# Patient Record
Sex: Male | Born: 1935 | Race: Black or African American | Hispanic: No | Marital: Married | Smoking: Former smoker
Health system: Southern US, Community
[De-identification: ages and names within clinical notes are randomized; demographics above are authoritative.]

## PROBLEM LIST (undated history)

## (undated) DIAGNOSIS — R238 Other skin changes: Secondary | ICD-10-CM

## (undated) DIAGNOSIS — I219 Acute myocardial infarction, unspecified: Secondary | ICD-10-CM

## (undated) DIAGNOSIS — M7551 Bursitis of right shoulder: Secondary | ICD-10-CM

## (undated) DIAGNOSIS — R39198 Other difficulties with micturition: Secondary | ICD-10-CM

## (undated) DIAGNOSIS — I82409 Acute embolism and thrombosis of unspecified deep veins of unspecified lower extremity: Secondary | ICD-10-CM

## (undated) DIAGNOSIS — I10 Essential (primary) hypertension: Secondary | ICD-10-CM

## (undated) DIAGNOSIS — J449 Chronic obstructive pulmonary disease, unspecified: Secondary | ICD-10-CM

## (undated) DIAGNOSIS — R05 Cough: Secondary | ICD-10-CM

## (undated) DIAGNOSIS — W19XXXA Unspecified fall, initial encounter: Secondary | ICD-10-CM

## (undated) DIAGNOSIS — I2510 Atherosclerotic heart disease of native coronary artery without angina pectoris: Secondary | ICD-10-CM

## (undated) DIAGNOSIS — E7800 Pure hypercholesterolemia, unspecified: Secondary | ICD-10-CM

## (undated) DIAGNOSIS — R499 Unspecified voice and resonance disorder: Secondary | ICD-10-CM

## (undated) DIAGNOSIS — J189 Pneumonia, unspecified organism: Secondary | ICD-10-CM

## (undated) DIAGNOSIS — R233 Spontaneous ecchymoses: Secondary | ICD-10-CM

## (undated) DIAGNOSIS — I639 Cerebral infarction, unspecified: Secondary | ICD-10-CM

## (undated) DIAGNOSIS — R1310 Dysphagia, unspecified: Secondary | ICD-10-CM

## (undated) DIAGNOSIS — R059 Cough, unspecified: Secondary | ICD-10-CM

## (undated) DIAGNOSIS — I739 Peripheral vascular disease, unspecified: Secondary | ICD-10-CM

## (undated) DIAGNOSIS — Y92009 Unspecified place in unspecified non-institutional (private) residence as the place of occurrence of the external cause: Secondary | ICD-10-CM

## (undated) DIAGNOSIS — I2699 Other pulmonary embolism without acute cor pulmonale: Secondary | ICD-10-CM

## (undated) HISTORY — PX: FEMORAL-FEMORAL BYPASS GRAFT: SHX936

## (undated) HISTORY — DX: Other skin changes: R23.8

## (undated) HISTORY — DX: Cough, unspecified: R05.9

## (undated) HISTORY — DX: Unspecified fall, initial encounter: W19.XXXA

## (undated) HISTORY — DX: Dysphagia, unspecified: R13.10

## (undated) HISTORY — PX: HERNIA REPAIR: SHX51

## (undated) HISTORY — DX: Unspecified voice and resonance disorder: R49.9

## (undated) HISTORY — DX: Other difficulties with micturition: R39.198

## (undated) HISTORY — DX: Acute myocardial infarction, unspecified: I21.9

## (undated) HISTORY — PX: APPENDECTOMY: SHX54

## (undated) HISTORY — DX: Pneumonia, unspecified organism: J18.9

## (undated) HISTORY — DX: Unspecified place in unspecified non-institutional (private) residence as the place of occurrence of the external cause: Y92.009

## (undated) HISTORY — DX: Cerebral infarction, unspecified: I63.9

## (undated) HISTORY — PX: ILIAC ARTERY STENT: SHX1786

## (undated) HISTORY — DX: Chronic obstructive pulmonary disease, unspecified: J44.9

## (undated) HISTORY — DX: Cough: R05

## (undated) HISTORY — DX: Spontaneous ecchymoses: R23.3

## (undated) SURGERY — Surgical Case
Anesthesia: *Unknown

---

## 1999-09-26 ENCOUNTER — Encounter: Payer: Self-pay | Admitting: *Deleted

## 1999-09-30 ENCOUNTER — Ambulatory Visit: Admission: RE | Admit: 1999-09-30 | Discharge: 1999-09-30 | Payer: Self-pay | Admitting: *Deleted

## 1999-10-02 ENCOUNTER — Encounter: Payer: Self-pay | Admitting: *Deleted

## 1999-10-03 ENCOUNTER — Inpatient Hospital Stay (HOSPITAL_COMMUNITY): Admission: RE | Admit: 1999-10-03 | Discharge: 1999-10-07 | Payer: Self-pay | Admitting: *Deleted

## 1999-10-03 ENCOUNTER — Encounter (INDEPENDENT_AMBULATORY_CARE_PROVIDER_SITE_OTHER): Payer: Self-pay | Admitting: *Deleted

## 1999-11-20 ENCOUNTER — Encounter: Admission: RE | Admit: 1999-11-20 | Discharge: 1999-11-20 | Payer: Self-pay | Admitting: *Deleted

## 1999-11-20 ENCOUNTER — Encounter: Payer: Self-pay | Admitting: *Deleted

## 2002-10-10 ENCOUNTER — Encounter: Payer: Self-pay | Admitting: Cardiology

## 2002-10-10 ENCOUNTER — Ambulatory Visit (HOSPITAL_COMMUNITY): Admission: RE | Admit: 2002-10-10 | Discharge: 2002-10-11 | Payer: Self-pay | Admitting: Cardiology

## 2002-12-20 ENCOUNTER — Inpatient Hospital Stay (HOSPITAL_COMMUNITY): Admission: RE | Admit: 2002-12-20 | Discharge: 2002-12-22 | Payer: Self-pay | Admitting: Cardiology

## 2002-12-21 ENCOUNTER — Encounter: Payer: Self-pay | Admitting: Cardiology

## 2003-08-02 ENCOUNTER — Inpatient Hospital Stay (HOSPITAL_COMMUNITY): Admission: EM | Admit: 2003-08-02 | Discharge: 2003-08-21 | Payer: Self-pay | Admitting: Emergency Medicine

## 2003-08-02 ENCOUNTER — Encounter (INDEPENDENT_AMBULATORY_CARE_PROVIDER_SITE_OTHER): Payer: Self-pay | Admitting: *Deleted

## 2006-08-19 ENCOUNTER — Inpatient Hospital Stay (HOSPITAL_COMMUNITY): Admission: AD | Admit: 2006-08-19 | Discharge: 2006-09-03 | Payer: Self-pay | Admitting: Surgery

## 2006-08-23 ENCOUNTER — Encounter (INDEPENDENT_AMBULATORY_CARE_PROVIDER_SITE_OTHER): Payer: Self-pay | Admitting: Cardiovascular Disease

## 2007-06-16 ENCOUNTER — Encounter: Admission: RE | Admit: 2007-06-16 | Discharge: 2007-06-16 | Payer: Self-pay | Admitting: Cardiology

## 2007-06-20 ENCOUNTER — Ambulatory Visit (HOSPITAL_COMMUNITY): Admission: RE | Admit: 2007-06-20 | Discharge: 2007-06-20 | Payer: Self-pay | Admitting: Cardiology

## 2007-07-04 ENCOUNTER — Ambulatory Visit (HOSPITAL_COMMUNITY): Admission: RE | Admit: 2007-07-04 | Discharge: 2007-07-04 | Payer: Self-pay | Admitting: Specialist

## 2007-12-23 ENCOUNTER — Ambulatory Visit (HOSPITAL_COMMUNITY): Admission: RE | Admit: 2007-12-23 | Discharge: 2007-12-23 | Payer: Self-pay | Admitting: Ophthalmology

## 2009-01-28 ENCOUNTER — Inpatient Hospital Stay (HOSPITAL_COMMUNITY): Admission: EM | Admit: 2009-01-28 | Discharge: 2009-02-05 | Payer: Self-pay | Admitting: Emergency Medicine

## 2009-02-04 ENCOUNTER — Encounter (INDEPENDENT_AMBULATORY_CARE_PROVIDER_SITE_OTHER): Payer: Self-pay | Admitting: Internal Medicine

## 2010-02-03 ENCOUNTER — Encounter: Admission: RE | Admit: 2010-02-03 | Discharge: 2010-02-03 | Payer: Self-pay | Admitting: Cardiovascular Disease

## 2010-02-07 ENCOUNTER — Inpatient Hospital Stay (HOSPITAL_COMMUNITY): Admission: RE | Admit: 2010-02-07 | Discharge: 2010-02-08 | Payer: Self-pay | Admitting: Cardiovascular Disease

## 2010-07-16 LAB — CBC
MCH: 27.1 pg (ref 26.0–34.0)
MCHC: 32.5 g/dL (ref 30.0–36.0)
Platelets: 193 10*3/uL (ref 150–400)
RBC: 4.46 MIL/uL (ref 4.22–5.81)
RDW: 15.4 % (ref 11.5–15.5)

## 2010-07-16 LAB — BASIC METABOLIC PANEL
Calcium: 8.5 mg/dL (ref 8.4–10.5)
Creatinine, Ser: 1.22 mg/dL (ref 0.4–1.5)
GFR calc non Af Amer: 58 mL/min — ABNORMAL LOW (ref >60)
Glucose, Bld: 92 mg/dL (ref 70–99)
Sodium: 135 mEq/L (ref 135–145)

## 2010-08-07 LAB — RETICULOCYTES
RBC.: 3.97 MIL/uL — ABNORMAL LOW (ref 4.22–5.81)
Retic Ct Pct: 0.7 % (ref 0.4–3.1)

## 2010-08-07 LAB — CBC
HCT: 27 % — ABNORMAL LOW (ref 39.0–52.0)
HCT: 29.9 % — ABNORMAL LOW (ref 39.0–52.0)
Hemoglobin: 9.9 g/dL — ABNORMAL LOW (ref 13.0–17.0)
MCHC: 33.3 g/dL (ref 30.0–36.0)
MCV: 80.2 fL (ref 78.0–100.0)
MCV: 81.8 fL (ref 78.0–100.0)
Platelets: 200 10*3/uL (ref 150–400)
Platelets: 214 10*3/uL (ref 150–400)
Platelets: 257 10*3/uL (ref 150–400)
RBC: 3.57 MIL/uL — ABNORMAL LOW (ref 4.22–5.81)
RBC: 3.59 MIL/uL — ABNORMAL LOW (ref 4.22–5.81)
RBC: 3.73 MIL/uL — ABNORMAL LOW (ref 4.22–5.81)
WBC: 6.1 10*3/uL (ref 4.0–10.5)
WBC: 7.1 10*3/uL (ref 4.0–10.5)
WBC: 7.6 10*3/uL (ref 4.0–10.5)
WBC: 9.6 10*3/uL (ref 4.0–10.5)

## 2010-08-07 LAB — COMPREHENSIVE METABOLIC PANEL
Alkaline Phosphatase: 132 U/L — ABNORMAL HIGH (ref 39–117)
BUN: 5 mg/dL — ABNORMAL LOW (ref 6–23)
Glucose, Bld: 144 mg/dL — ABNORMAL HIGH (ref 70–99)
Potassium: 3.9 mEq/L (ref 3.5–5.1)
Sodium: 142 mEq/L (ref 135–145)
Total Bilirubin: 1.3 mg/dL — ABNORMAL HIGH (ref 0.3–1.2)
Total Protein: 5.6 g/dL — ABNORMAL LOW (ref 6.0–8.3)

## 2010-08-07 LAB — PHOSPHORUS: Phosphorus: 1.7 mg/dL — ABNORMAL LOW (ref 2.3–4.6)

## 2010-08-07 LAB — BASIC METABOLIC PANEL
BUN: 20 mg/dL (ref 6–23)
CO2: 21 mEq/L (ref 19–32)
Calcium: 7.8 mg/dL — ABNORMAL LOW (ref 8.4–10.5)
Chloride: 110 mEq/L (ref 96–112)
Creatinine, Ser: 1.5 mg/dL (ref 0.4–1.5)
GFR calc Af Amer: 52 mL/min — ABNORMAL LOW (ref >60)
GFR calc non Af Amer: 41 mL/min — ABNORMAL LOW (ref >60)
GFR calc non Af Amer: 46 mL/min — ABNORMAL LOW (ref >60)
Potassium: 2.8 mEq/L — ABNORMAL LOW (ref 3.5–5.1)
Potassium: 3.2 mEq/L — ABNORMAL LOW (ref 3.5–5.1)
Sodium: 140 mEq/L (ref 135–145)

## 2010-08-07 LAB — AMYLASE: Amylase: 43 U/L (ref 27–131)

## 2010-08-07 LAB — FOLATE

## 2010-08-07 LAB — LIPASE, BLOOD: Lipase: 22 U/L (ref 11–59)

## 2010-08-07 LAB — MAGNESIUM: Magnesium: 1.8 mg/dL (ref 1.5–2.5)

## 2010-08-07 LAB — VITAMIN B12: Vitamin B-12: 481 pg/mL (ref 211–911)

## 2010-08-07 LAB — BRAIN NATRIURETIC PEPTIDE
Pro B Natriuretic peptide (BNP): 255 pg/mL — ABNORMAL HIGH (ref 0.0–100.0)
Pro B Natriuretic peptide (BNP): 96 pg/mL (ref 0.0–100.0)

## 2010-08-08 LAB — URINALYSIS, ROUTINE W REFLEX MICROSCOPIC
Bilirubin Urine: NEGATIVE
Hgb urine dipstick: NEGATIVE
Nitrite: NEGATIVE
Protein, ur: NEGATIVE mg/dL
Specific Gravity, Urine: 1.011 (ref 1.005–1.030)
Urobilinogen, UA: 0.2 mg/dL (ref 0.0–1.0)

## 2010-08-08 LAB — COMPREHENSIVE METABOLIC PANEL
ALT: 10 U/L (ref 0–53)
AST: 14 U/L (ref 0–37)
Alkaline Phosphatase: 77 U/L (ref 39–117)
BUN: 14 mg/dL (ref 6–23)
CO2: 24 mEq/L (ref 19–32)
Calcium: 8.2 mg/dL — ABNORMAL LOW (ref 8.4–10.5)
Calcium: 9.4 mg/dL (ref 8.4–10.5)
Creatinine, Ser: 1.52 mg/dL — ABNORMAL HIGH (ref 0.4–1.5)
GFR calc Af Amer: 55 mL/min — ABNORMAL LOW (ref >60)
GFR calc non Af Amer: 45 mL/min — ABNORMAL LOW (ref >60)
GFR calc non Af Amer: 46 mL/min — ABNORMAL LOW (ref >60)
Glucose, Bld: 144 mg/dL — ABNORMAL HIGH (ref 70–99)
Glucose, Bld: 145 mg/dL — ABNORMAL HIGH (ref 70–99)
Sodium: 139 mEq/L (ref 135–145)
Total Protein: 6.2 g/dL (ref 6.0–8.3)
Total Protein: 8.2 g/dL (ref 6.0–8.3)

## 2010-08-08 LAB — CBC
HCT: 43.5 % (ref 39.0–52.0)
Hemoglobin: 14.3 g/dL (ref 13.0–17.0)
MCHC: 32.9 g/dL (ref 30.0–36.0)
MCHC: 32.9 g/dL (ref 30.0–36.0)
MCV: 82.1 fL (ref 78.0–100.0)
RBC: 4.6 MIL/uL (ref 4.22–5.81)
RDW: 14.7 % (ref 11.5–15.5)
RDW: 14.8 % (ref 11.5–15.5)

## 2010-08-08 LAB — URINE CULTURE
Colony Count: NO GROWTH
Culture: NO GROWTH

## 2010-08-08 LAB — CARDIAC PANEL(CRET KIN+CKTOT+MB+TROPI)
CK, MB: 2.4 ng/mL (ref 0.3–4.0)
CK, MB: 2.6 ng/mL (ref 0.3–4.0)
Relative Index: 1.9 (ref 0.0–2.5)
Total CK: 128 U/L (ref 7–232)
Total CK: 135 U/L (ref 7–232)
Troponin I: 0.02 ng/mL (ref 0.00–0.06)

## 2010-08-08 LAB — PROTIME-INR
INR: 1.1 (ref 0.00–1.49)
Prothrombin Time: 13.9 seconds (ref 11.6–15.2)

## 2010-08-08 LAB — BASIC METABOLIC PANEL
Calcium: 8.3 mg/dL — ABNORMAL LOW (ref 8.4–10.5)
Creatinine, Ser: 1.23 mg/dL (ref 0.4–1.5)
GFR calc Af Amer: >60 mL/min (ref >60)
GFR calc non Af Amer: 58 mL/min — ABNORMAL LOW (ref >60)
Sodium: 137 mEq/L (ref 135–145)

## 2010-08-08 LAB — LACTIC ACID, PLASMA: Lactic Acid, Venous: 1.1 mmol/L (ref 0.5–2.2)

## 2010-08-08 LAB — DIFFERENTIAL
Basophils Relative: 0 % (ref 0–1)
Lymphs Abs: 0.9 10*3/uL (ref 0.7–4.0)
Monocytes Relative: 6 % (ref 3–12)
Neutro Abs: 7 10*3/uL (ref 1.7–7.7)
Neutrophils Relative %: 82 % — ABNORMAL HIGH (ref 43–77)

## 2010-08-08 LAB — POCT CARDIAC MARKERS: Myoglobin, poc: 176 ng/mL (ref 12–200)

## 2010-08-08 LAB — LIPASE, BLOOD: Lipase: 50 U/L (ref 11–59)

## 2010-09-16 NOTE — Procedures (Signed)
NAMEANTINIO, SANDERFER NO.:  [id number]   MEDICAL RECORD NO.:  [id number]          PATIENT TYPE:  INP   LOCATION:  2502                         FACILITY:  [facility]   PHYSICIAN:  Nanetta Batty, M.D.   DATE OF BIRTH:  16-Aug-1933   DATE OF PROCEDURE:  DATE OF DISCHARGE:                    PERIPHERAL VASCULAR INVASIVE PROCEDURE    [name] is a 77 year old thin-appearing married African American  male, father of three, grandfather of four grandchildren whose wife  [name] is also a patient of mine.  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  His  risk factors include remote tobacco abuse, hypertension, hyperlipidemia,  and family history.  His father died of an MI in his 95s and a sister  had an MI in her 44s.  He has never had a heart attack or stroke.  He  had a nonischemic Myoview on Sep 16, 2007, and again on February 04, 2010.  He had a fem-fem crossover graft performed by Dr. Liliane Bade because of  occluded left common iliac artery with known occluded SFAs bilaterally.  He also had stenting of his right common iliac artery on July 04, 2007.  He does complain of calf claudication, which has been increasing in  severity.  Dopplers show high-frequency signal in his right external  iliac artery with progressively decreasing ABIs.  He presents now for  angiography and potential intervention for lifestyle-limiting  claudication.   PROCEDURE DESCRIPTION:  The patient was brought to the Second Floor  [facility] in the postabsorptive state.  He was  premedicated with p.o. Valium, IV Versed, and fentanyl.  His right groin  was prepped and shaved in usual sterile fashion.  Xylocaine 1% was used  for local anesthesia.  A short 5-French sheath was inserted into the  right femoral artery using standard Seldinger technique below the  insertion of the fem-fem crossover graft.  This was  visualized  angiographically through the Seldinger needle.  A 5-French tennis racket  catheter was used for abdominal aortography as well as pelvic  angiography.  Visipaque dye was used for the entirety of the case.  Retrograde aortic pressures were monitored during the case.  A pullback  gradient was performed using a 5-French end-hole and 200 mcg of intra-  arterial nitroglycerin across the right external iliac artery stenosis,  which showed a 20-30 mm pullback gradient.  Visipaque dye was used for  the entirety of the case.  Retrograde aortic pressures were monitored  during the case.   ANGIOGRAPHIC RESULTS:  1. Abdominal aorta.      a.     Renal arteries - normal.      b.     Infrarenal abdominal aorta - normal.  2. Left lower extremity.      a.     Occluded left common iliac artery.  3. Right lower extremity.      a.     Patent left ostomy, right common iliac artery stent.      b.     A 75% right external  iliac artery stenosis with a 20-30 mm       pullback gradient after administration of intra-arterial       nitroglycerin.      c.     Patent fem-fem crossover graft.   PROCEDURE DESCRIPTION:  The 5-French sheath was exchanged over Versacore  wire for a 6-French bright tip.  The patient received 4000 units of  heparin intravenously.  He was on aspirin and Plavix.  The stenosis was  predilated with a 5 x 2 balloon, stented with a 10 x 4 Protege and  postdilated with a 7 x 3 balloon and 3 atmospheres resulting in  reduction of 75% stenosis to 0% residual.  The patient tolerated the  procedure well.  ACT was measured  and the sheath was removed.  Pressure was used on the groin to achieve  hemostasis.  The patient left the lab in stable condition.  He will be  gently hydrated, discharged home in the morning.  He will get followup  Dopplers in our office, and I will see him back after that in followup.      Nanetta Batty, M.D.      JB/MEDQ  D:  02/07/2010  T:  02/08/2010   Job:  [id number]   cc:   [facility]  Deirdre Peer. Polite, M.D.  [facility]   Electronically Signed by Nanetta Batty M.D. on 02/12/2010 01:04:12 PM

## 2010-09-16 NOTE — Cardiovascular Report (Signed)
[name], [name]                ACCOUNT NO.:  [id number]   MEDICAL RECORD NO.:  [id number]          PATIENT TYPE:  AMB   LOCATION:  SDS                          FACILITY:  [facility]   PHYSICIAN:  Vonna Kotyk R. Jacinto Halim, MD       DATE OF BIRTH:  1933-11-13   DATE OF PROCEDURE:  07/04/2007  DATE OF DISCHARGE:                            CARDIAC CATHETERIZATION   PROCEDURE PERFORMED:  1. Abdominal aortogram.  2. Right iliac arteriogram.  3. PTA and stenting of the right common and external iliac artery.  4. Balloon angioplasty of the stent.   INDICATIONS:  [name] is a 73-year gentleman with known  peripheral arterial disease.  He has known occluded left common iliac  artery and has a right femoral to left femoral arterial bypass graft.  His SFA is systolically known to be occluded, and the profunda femoral  artery gives collaterals to the distal SFA on the left, with maintenance  of three-vessel runoff.  On the right, he had mild diffuse disease, and  his right superficial femoral arteries are occluded.  He has been  complaining of claudication, and he had undergone outpatient Doppler  evaluation, and this had revealed high-grade stenosis of the right  common iliac artery.  His ABI on the right was 0.58, and the left was  0.48, and this had decreased from prior ABIs.   Given this, there was jeopardy of both his lower extremities with a high-  grade right common iliac artery stenosis and a known right femoral to  left femoral artery bypass grafting.  He is now brought to the  catheterization lab to evaluate his peripheral anatomy.   ABDOMINAL AORTOGRAM:  Distal abdominal aortogram revealed the left iliac  artery to be occluded and the right iliac artery to be widely patent  with the calcific 60% to 70% stenosis.  The right femoral to left  femoral bypass grafting widely patent.   The left superficial femoral artery is occluded at it's origin, and the  left profunda has a ostial 60%  to 70% stenosis.  This is unchanged from  prior catheterization in August of 2004.   There was a 28 to 25-mm pressure gradient across the right common iliac  artery stenosis.   INTERVENTION DATA:  Successful PTA and direct stenting of the right  common iliac artery with implantation of a 10.0 x 60-mm Prodigy self-  expanding stent which was post dilated to 9.0 x 40-mm Agiltrac balloon  at peak of 10 atmospheric pressure.  The stenosis was reduced from 70%  to zero percent, with brisk flow.   RECOMMENDATIONS:  The patient will be discharged home today with  outpatient Dopplers of the right iliac arteries and ABI of both the  lower extremities.  If the symptoms of claudication persist, and there  is no change in the ABI, then only will I would consider antegrade  access to the right common femoral artery and balloon angioplasty of the  left profunda femoral artery, via right femoral to left femoral graft.  This will be a last resort.   The  patient will be continued on the present aggressive medical therapy.   A total of 80 mL of contrast was utilized for diagnostic and  interventional procedure.   Under usual sterile precaution, a abdominal aortogram was performed,  then a right iliac arteriogram was also performed.  A careful pullback  was performed using a end-hole catheter.  Abdominal gram was performed  using a pigtail catheter with a Wholey wire.   TECHNIQUE OF INTERVENTION:  Using 3000 units of heparin, exchange of 5-  French sheath to a 7-French short sheath, then a 10.0 x 60-mm Prodigy  self-expanding stent was advanced and positioned appropriately into the  origin of the common iliac artery, and the stent was deployed and  postdilated with a 9.0 by 40-mm atrial tract balloon at 10 atmospheres  of pressure.  The stenosis was reduced from 70% to zero percent.  Excellent brisk flow was noted without any residual pressure gradient  across the stenosis.  The catheter was then  pulled out of the body.  The  patient tolerated the procedure.  No immediate complications were noted.   Please note:  The common femoral arteriogram was not performed to  preserve and reduce contrast use, as the patient does have chronic renal  insufficiency.      Cristy Hilts. Jacinto Halim, MD  Electronically Signed     JRG/MEDQ  D:  07/04/2007  T:  07/04/2007  Job:  [id number]

## 2010-09-19 NOTE — Op Note (Signed)
[name], [name]                            ACCOUNT NO.:  [id number]   MEDICAL RECORD NO.:  [id number]                   PATIENT TYPE:  INP   LOCATION:  0157                                 FACILITY:  [facility]   PHYSICIAN:  Currie Paris, M.D.           DATE OF BIRTH:  Aug 30, 1933   DATE OF PROCEDURE:  08/02/2003  DATE OF DISCHARGE:                                 OPERATIVE REPORT   PREOPERATIVE DIAGNOSIS:  Acute small bowel obstruction.   POSTOPERATIVE DIAGNOSIS:  Perforated appendicitis with small bowel  obstruction.   OPERATION:  Exploratory laparotomy with cecectomy.   SURGEON:  Dr. Jamey Ripa.   ASSISTANT:  Dr. Earlene Plater.   ANESTHESIA:  General endotracheal.   CLINICAL HISTORY:  This patient is a 77 year old, who has presented with a 3  day history of diffuse abdominal pain, nausea, vomiting, distention, and  lack of bowel movements.  His white count was normal.  He was afebrile.  His  x-rays appeared to show a small bowel obstruction.  The patient was markedly  volume depleted and had been given IV fluids.  He was started on  antibiotics.  He was taken to the operating room for exploratory laparotomy.   DESCRIPTION OF PROCEDURE:  The patient was taken to the operating room and  after satisfactory general endotracheal anesthesia had been obtained, Foley  catheter was placed.  The abdomen was prepped and draped.  A midline  incision was made and the abdominal cavity entered.  Markedly dilated small  bowel was found, and I started to manipulate this out of the abdomen.  As we  got towards the terminal ileum, there was an inflammatory process.  As I  manipulated through that, I entered an abscess cavity and found a necrotic  appendix stuck into the pelvis with a loop of distal small bowel that had  been stuck down to that, acting as a point of obstruction.  I was able to  bluntly dissect this up out of the lateral attachments and free up the cecum  and mobilize this up into  the wound.  The small bowel mesentery had a small  tear in it which had to be sutured, but this was very friable I think  primarily from the inflammatory process.   Once we had the cecum up, I could see that the appendix was necrotic all the  way to the base and actually a little bit of the cecum appeared necrotic as  well.  However, this was a fairly localized area descending about 1.5 cm  around the base of the appendix.  After having this well-mobilized up, I was  able to grasp the cecum on either side of the appendix with a Babcock to  hold up the worrisome area and put a TA 60 stapler under this and fire it  and amputate this portion of the cecum along with the appendix.  Having done  this at  the base of the appendix, the cecum then appeared quite viable, and  I think we were down to good healthy tissue here.   At this point, I stopped and spent a few minutes trying to milk the  intestinal contents back into the stomach so that we could decompress these  which we did.  I then spent several minutes irrigating, using about 4-5  liters of warm saline to completely irrigate the abdominal cavity.  This  chronic abscess cavity looked to have been present for I thought more than  just a few days and likely had been smoldering without his really being too  particularly symptomatic since it seemed to be primarily retrocecal and  pelvic rather than more anterior.  Nevertheless, once we had this irrigation  done, everything appeared intact.  I went ahead and closed with running #1  PDS followed by staples and using some Telfa wicks in between the staples.   The patient tolerated the procedure well.  There were no operative  complications.   All counts were correct.                                               Currie Paris, M.D.    CJS/MEDQ  D:  08/02/2003  T:  08/02/2003  Job:  [id number]   cc:   Derrek Gu, MD  [facility]   [place] R. Jacinto Halim, M.D.  [address]   [place]  [place] [address]  Fax: [phone]

## 2010-09-19 NOTE — Cardiovascular Report (Signed)
[name]                            ACCOUNT NO.:  [id number]   MEDICAL RECORD NO.:  [id number]                   PATIENT TYPE:  OIB   LOCATION:  2875                                 FACILITY:  [facility]   PHYSICIAN:  Cristy Hilts. Jacinto Halim, M.D.                  DATE OF BIRTH:  [birth year]   DATE OF PROCEDURE:  10/10/2002  DATE OF DISCHARGE:                              CARDIAC CATHETERIZATION   PROCEDURES PERFORMED:  1. Abdominal aortogram.  2. Iliac arteriogram with bifemoral runoff.   CARDIOLOGIST:  Cristy Hilts. Jacinto Halim, M.D.   INDICATIONS:  [name] is a 77 year old African-American male with  history of hypertension, hyperlipidemia, severe peripheral vascular disease  status post right femoral to left femoral bypass grafting performed October 03, 1999 by Dr. Court Joy by means of a 7 mm Hemashield dacron graft presents  for evaluation of severe symptomatic claudication.  The patient, in fact,  has been unable to walk even within his house without having to stop for  symptomatic claudication.  Given his he underwent Dopplers evaluation, which  revealed severely abnormal Dopplers studies.  He was brought to the  catheterization lab to evaluate his peripheral anatomy.   ABDOMINAL AORTOGRAM:  Abdominal aortogram revealed mild arteriosclerotic  changes of the abdominal aorta.  There was no evidence of abdominal aortic  aneurysm.  There were two renal arteries, one on either side.  They are  widely patent.  The aortoiliac bifurcation was widely patent on the left and  the right had about 40% stenosis.   RIGHT ILIAC ARTERIOGRAM WITH FEMORAL RUNOFF:  The right iliac arteriogram  with femoral runoff revealed the right iliac artery to have mild-to-moderate  diffuse disease.  There is a graft arising from the right femoral artery  extending over to the left femoral artery.  The right common femoral artery  continues as SFA, which has a mid 90% stenosis followed by an occlusion in  its  mid-to-distal segment.  Distally the SFA continues as popliteal artery  and has diffuse disease.  There is only a peroneal runoff that is well-noted  with severe disease of anterior and posterior tibials.   LEFT ILIAC ARTERIOGRAM WITH FEMORAL RUNOFF:  The left iliac artery is  occluded in its proximal segment.  The left common femoral artery is  supplied by the right-to-left femorofemoral grafting.  Immediately after the  graft insertion into the right common femoral artery the common femoral  artery continues as SFA and has an aneurysmal dilatation followed by a  complete occlusion.  There is a long segment occlusion, which is  collateralized by the profunda femoris on the left side.  Again, the  popliteal artery has mild-to-moderate diffuse disease and the anterior and  posterior tibials are occludes with only peroneal runoff in the left lower  extremity.   IMPRESSION:  1. Mild disease of the abdominal aorta and  widely patent renal arteries.  2. Occluded left iliac artery that was evident on prior angiogram in 2001.     There is severe progression of disease in the left lower extremity with     occlusion of the left superficial femoral artery and severe disease in     the legs with occluded anterior and posterior tibials with one-vessel     runoff.  The left superficial femoral artery is occluded after the     femorofemoral bypass graft.  3. The right common iliac has 40% ostial stenosis.  There is a 15 mm     pressure gradient apex stenosis, otherwise mild, diffuse.  The right     superficial femoral artery has 90% stenosis in the mid segment and is     occluded in its mid-to-distal segment, very short occlusion.  Again,     severe disease of the anterior and posterior tibials, which are severely     disease and there is only one vessel runoff by means of the peroneal     artery the  right lower extremity.   RECOMMENDATIONS:  The patient is at a very high risk for limb loss.  I  would  like to give an attempt by means of an antegrade approach to cross through  the femorofemoral graft and try to open up the left superficial femoral  artery, which is occluded.  I would to do this on an elective basis.  I will  discuss these issues with the patient and the family; and, the family is in  agreement at this time.  I will discuss with a colleague before proceeding  with the procedure.   TECHNIQUE OF THE PROCEDURE:  Under the usual sterile precautions using 5  French right femoral arterial access a 5 French pigtail catheter was  advanced into the abdominal aorta over a 0.035 inch J wire.  Abdominal  aortogram was performed.  Then the pigtail catheter was pulled back into the  aortoiliac bifurcation and iliac arteriogram with femoral runoff was  performed.  Then the catheter was pulled out of the body and an end-hole was  advanced into the abdominal aorta and slowly pulled back to monitor the  pressure gradient across the right aortoiliac bifurcation stenosis.  There  is a 15 mm pressure gradient and the catheter was pulled out of the body.   Right femoral angiography and the femorofemoral bypass graft angiogram was  performed through the right femoral arterial sheath.  After obtaining  adequate views the access sheath was pulled, manual pressure was held,  adequate hemostasis was obtained, and patient was transferred to recovery in  stable condition.                                                 Cristy Hilts. Jacinto Halim, M.D.    Pilar Plate  D:  10/10/2002  T:  10/10/2002  Job:  [id number]   cc:   Gabriel Earing, M.D.  [facility]   [facility]

## 2010-09-19 NOTE — H&P (Signed)
[name], [name]                            ACCOUNT NO.:  [id number]   MEDICAL RECORD NO.:  [id number]                   PATIENT TYPE:  INP   LOCATION:  0101                                 FACILITY:  [facility]   PHYSICIAN:  Currie Paris, M.D.           DATE OF BIRTH:  1933-07-26   DATE OF ADMISSION:  08/02/2003  DATE OF DISCHARGE:                                HISTORY & PHYSICAL   CHIEF COMPLAINT:  Abdominal pain.   HISTORY OF PRESENT ILLNESS:  [name] is a 77 year old gentleman who  presented to Dr. Kathrynn Running at [facility] today with increasing abdominal pain.  His pain began Monday (today is Thursday) with nausea, vomiting, and  abdominal distention. His pain was poorly localized, but more in the lower  abdomen than upper.  He developed nausea, vomiting, but no fever. He thought  he was constipated and tried taking a laxative that did not help, and he had  no real result.  Because of continuing pain he tried an enema this morning  at about 3:30 and again no improvement. He has not been able to eat anything  or keep anything down, although he has been continuing to try to take his  medications. His pain, at the time I saw him, was fairly diffuse and not  acute. It was not particularly localized at all. He had not had a bowel  movement he said for a day, except for miniscule results with his enema this  morning.   OPERATIONS:  He has had bilateral fem-pops with apparently a femoral hernia  being repaired at the time of one of these. He had prior attempts at  placement of vascular stents which have been unsuccessful.   MEDICATIONS:  He takes Clarinex, Nexium, Advicor, Plendil, Plavix, Toprol,  and Cozaar.   ALLERGIES:  None known.   HABITS:  Smokes: None. Alcohol: None.   SOCIAL HISTORY:  The patient is retired.   FAMILY HISTORY:  Unremarkable.   REVIEW OF SYSTEMS:  HEENT: Basically negative. CHEST: No cough or shortness  of breath. HEART: History of hypertension.  No history of MIs. Does have a  history of having had a normal Cardiolite about a year ago.  ABDOMEN:  Negative except for HPI. GU: Negative. EXTREMITIES: He has had a history of  peripheral vascular disease.   PHYSICAL EXAMINATION:  GENERAL: The patient is a thin, somewhat toxic, but  not acutely, and ill-appearing male.  VITAL SIGNS: His blood pressure at [facility] is 110/80, about 90 systolic  when he came here with a pulse of 120, but after fluids came up to about 130  and still with a pulse of around 120. Temperature has been in the 97 to 98  range over several readings. His weight was noted to be 128 at [facility].  HEENT:  Head is normocephalic. Eyes reveal pupils equal, round, and regular;  nonicteric. Pharynx reveals he does  have dry mucous membranes; otherwise  unremarkable.  NECK: Supple with no masses or thyromegaly.  LUNGS: Normal respiration; clear to auscultation.  HEART: Rapid rate. No murmurs, rubs, or gallops heard.  ABDOMEN: Diffusely distended and tender. There is some mild rebound present.  He seems perhaps more tender in the right lower quadrant than any place  else.  Bowel sounds when I listened were quiet.  RECTAL EXAM: Negative at [facility] and not repeated by me.  EXTREMITIES: There is no cyanosis or edema. I think I can feel some pulses  in his feet.   LABORATORY STUDIES:  White count 4800. Electrolytes normal, but creatinine  3.9 with BUN 48.   Abdominal films are consistent with a small bowel obstruction, but there is  question of a dilated ascending colon. Chest x-ray is unremarkable.  EKG  shows sinus tachycardia with some ST changes, question of digoxin effect,  but nothing to suggest acute MI.   IMPRESSION:  1. Acute small bowel obstruction of uncertain etiology.  2. Volume depletion secondary to #1.  3. Acute renal insufficiency secondary to #1.  4. Hypertension.  5. History of peripheral vascular disease.   PLAN:  He has already been  receiving IV fluids. Will start him on IV  antibiotics and place an NG. Once we have some hydration established I think  we need to go ahead with exploratory laparotomy as I am concerned about his  tenderness and x-ray pictures that he may have some significant process  going on that needs urgent surgery.                                               Currie Paris, M.D.    CJS/MEDQ  D:  08/02/2003  T:  08/02/2003  Job:  [id number]   cc:   [facility]  Attn: Dr. Kathrynn Running

## 2010-09-19 NOTE — Discharge Summary (Signed)
[name], [name]                            ACCOUNT NO.:  [id number]   MEDICAL RECORD NO.:  [id number]                   PATIENT TYPE:  INP   LOCATION:  0355                                 FACILITY:  [facility]   PHYSICIAN:  Currie Paris, M.D.           DATE OF BIRTH:  01/31/1934   DATE OF ADMISSION:  08/02/2003  DATE OF DISCHARGE:  08/21/2003                                 DISCHARGE SUMMARY   FINAL DIAGNOSES:  1. Acute small bowel obstruction secondary to perforated appendicitis and     appendiceal abscess.  2. History of peripheral vascular disease.  3. Persistent postoperative tachycardia secondary most likely to beta     blocker withdrawal and low-grade sepsis.  4. History of hypertension.  5. Acute renal insufficiency secondary to volume depletion, creatinine 3.9     on admission, but this corrected down to 1.2 by the time of discharge     with rehydration.   CLINICAL HISTORY:  [name] presented to the emergency room with what  appeared to be acute small bowel obstruction developing over several days.  He has a history of peripheral vascular disease, hypertension, and has been  on chronic beta blockers.   HOSPITAL COURSE:  The patient was admitted and taken to the operating room  where a exploratory laparotomy was done.  He was found to have a perforated  appendicitis with appendiceal abscess causing the small bowel obstruction.  Partial cecectomy was done along with the appendectomy.  He tolerated the  procedure well.   Postoperatively, he had some hypotension and tachycardia probably related to  volume issues but also to withdrawal of his beta blockers.  His hemoglobin  did drift down, and he had been on Plavix.  We were concerned that he had  bled postoperatively and gave him 2 units of blood, but most likely this was  secondary to marked volume depletion initially and rehydration, but there  was some evidence on CT of some hemoperitoneum as well.  However,  this  remained stable and did not prove to be any problem.   He was seen by cardiology consultation and remained stable on NG suction and  took several days of IV Lopressor, __________, and Catapres patch to control  blood pressure and pulse, but these eventually stabilized out.  By April 5,  which was his fifth postop day, he was passing a little bit of gas with  small bowel movement.  His abdomen was improving.  His wound appeared to be  clean.  White count was down to 8000.  His TSH was slightly low.  T4 was  pending.  He was continued on IV ciprofloxacin for antibiotics.  He remained  NPO because he was unable to really tolerate much orally and by the seventh  postop day, he was started on some clear liquids, trying to gradually  advance his diet.  By the ninth postop day, he developed  some vomiting and  PICC line was placed and TNA began for malnutrition.  By the 11th postop  day, he started dramatically improving with a lot of gas passage.  He had  benign abdomen.  His wound looked okay.  He had a little drainage from it  but never developed a wound infection.  He had his NG discontinued and diet  increased over the next several days, went very slowly.  We switched his  antibiotics from ciprofloxacin to Zosyn and on April 13, discontinued his  Zosyn.  By April 15, he was feeling better, wanted to eat more.  We began to  wean his TNA.  His fevers eventually went away and by April 19, he was both  stable cardiovascular wise and surgical wise and able to be discharged.  Discharge medicines were managed by cardiology for his blood pressure  medicines.  He was given Advil and Motrin for pain to follow up in my office  in a couple of weeks, to follow up with Dr. Jacinto Halim on the 18th.   DISCHARGE MEDICATIONS:  1. Metoprolol 50 mg b.i.d.  2. Cozaar 100 mg daily.  3. Protonix 40 mg daily.  4. More office medications to be started in the office.   Laboratory studies this admission showed a  hemoglobin of 7.5  postoperatively.  White counts remained normal.  After transfusion, his  hemoglobin count was around 10 and stabilized at that level.  CKs were  checked and were unremarkable.                                               Currie Paris, M.D.    CJS/MEDQ  D:  09/13/2003  T:  09/13/2003  Job:  [id number]   cc:   Dr. Kathrynn Running  [facility] [place]   Dr. Jacinto Halim  [facility]

## 2010-09-19 NOTE — H&P (Signed)
[place]. [facility]  Patient:    [name], [name]                         MRN: [id number] Adm. Date:  10/03/99 Attending:  Denman George, M.D. Dictator:   Sherrie George, P.A. CC:         Meliton Rattan, M.D., [facility] [place], [place]             [facility]                         History and Physical  BRIEF HISTORY:  The patient is a 77 year old black male with a long history of claudication of the left leg dating back at least one year.  The pain was initially in his calf but not has progressed to include his foot.  He can walk flat for 200 or 300 yards before it hurts him, but any hill makes the pain worse immediately.  He was evaluated by Dr. Earlene Plater at [facility] and found to have diminished pulses and was referred to CVTS and Dr. Liliane Bade.  Doppler studies in our office show an ankle-brachial index of 0.49 on the left and greater than 1.0 on the right.  He has been experiencing increasing numbness and pain in his left lower extremity.  He has undergone outpatient arteriogram at [facility] and is admitted at this time for a right-to-left femoral-femoral bypass graft to relieve his left lower extremity claudication.  ALLERGIES:  None known.  MEDICATIONS ON ADMISSION: 1. Pletal 100 mg b.i.d. 2. Hydrocodone p.r.n. which he has not used.  PAST MEDICAL HISTORY:  Includes a fractured right ankle six to eight years ago.  He has had laser surgery to both eyes to correct his vision and a history of asthma 40 years ago.  He also had pneumonia approximately in November of last year which was treated successfully with antibiotics.  SOCIAL HISTORY:  He is a retired Counsellor for [employer].  He has been married for 38 years.  They have two children, ages 13 and 40, both in good health.  He eats a regular diet.  He smoked one pack of cigarettes per day for 37 years.  He stopped two weeks ago.  He used to drink alcohol,  approximately two 6-packs per week, and he stopped that five years ago.  FAMILY HISTORY:  His father is deceased at age 22 with an MI, but he was at work when he did it.  Mother died at age 49 of old age, and he is not sure what the cause of death was.  He has two brothers living.  He has one brother with diabetes and one sister who has had an MI.  His one brother with diabetes is the only family member that has it.  A sister and father both have heart disease.  No other significant family history.  REVIEW OF SYSTEMS:  He wears reading glasses and only uses them for very fine print since his laser surgery.  Cardiovascular is negative.  Pulmonary: He did note shortness of breath after being at [facility] that resolved within 24 hours after discharge.  Cardiac: Negative.  GI: Negative.  GU: Negative.  PHYSICAL EXAMINATION:  VITAL SIGNS:  Heart rate 120, respirations 18 and unlabored, blood pressure 128/100 in both arms in the sitting position.  He had a similar problem at [facility],  and he says his blood pressure went down later.  GENERAL:  He is well-nourished, well-developed, thin, black male in no acute distress.  HEENT:  Head: Normocephalic.  Eyes: PERRLA. EOMs intact.  Fundi not visualized.  He had mild bilateral arcus senilis.  Ears, nose, throat, and mouth were essentially normal.  He does have false teeth.  NECK:  No bruits.  CHEST:  Clear to auscultation and percussion bilaterally.  HEART:  Normal S1 and S2.  No murmurs, rubs, or gallops.  He was tachycardic. Pulses in the carotid, radial, brachial are +2 and equal.  The femoral was +2 on the right, +1 on the left.  Nothing was palpated below the femoral on the left.  The popliteal was +1, PT +1 on the right, DP +2 on the right.  ABDOMEN:  Soft, nontender.  Positive bowel sounds.  No palpable organomegaly.  GU/RECTAL:  Not performed, not medically indicated.  EXTREMITIES:  No clubbing, cyanosis, or edema.   Left lower extremity is without any ulcerations or wounds.  Motion and sensation are intact.  NEUROLOGIC:  No focal deficits.  IMPRESSION: 1. Claudication of left lower extremity with progressive numbness and pain. 2. Positive history of tobacco use with a history of asthma many years    ago.  PLAN:  The patient is to undergo a right-to-left femoral-femoral bypass graft for relief of his claudication symptoms. DD:  10/02/99 TD:  10/02/99 Job: [id number]

## 2010-09-19 NOTE — Cardiovascular Report (Signed)
[name], [name]                            ACCOUNT NO.:  [id number]   MEDICAL RECORD NO.:  [id number]                   PATIENT TYPE:  OIB   LOCATION:  2874                                 FACILITY:  [facility]   PHYSICIAN:  Cristy Hilts. Jacinto Halim, M.D.                  DATE OF BIRTH:  [birth year]   DATE OF PROCEDURE:  12/19/2002  DATE OF DISCHARGE:                              CARDIAC CATHETERIZATION   PROCEDURE:  Peripheral angiography and angioplasty of the left iliac artery.   INDICATIONS:  [name] is a 77 year old African-American male with  history of peripheral vascular disease status post right fem-fem bypass  surgery in the past and who had had a peripheral angiography performed on  October 10, 2002 which had revealed a severely diseased left SFA and old left  iliac artery stenosis.  He also has residual right SFA mid occlusion.  He  was brought to the catheterization laboratory for an attempt at angioplasty  of the left iliac artery given his severe symptoms of claudication.   ANGIOGRAPHIC DATA:  The left iliac artery is occluded in its proximal  segment.  The common femoral artery reconstitutes by collaterals and also by  a Dacron graft that is bypassed from the right femoral artery to the left  superficial femoral artery.  The left superficial femoral artery itself is  severely diseased at the insertion site of the graft and is subtotally  occluded just distal to the graft.  However, there is good vessel runoff  noted in the legs after the SFA reconstitutes in mid to lower thigh.   INTERVENTION DATA:  Unsuccessful PTA of the left iliac artery.  Excellent  access was obtained in the left common femoral artery.  However, unable to  traverse the guidewire into the true lumen of the artery.  After multiple  attempts the procedure was abandoned.   RECOMMENDATIONS:  Continued medical therapy is advised.  (?) Possible  angioplasty of the right SFA by antegrade technique can be  considered for  his continued symptoms of claudication on his right lower extremity.  I will  readdress his issues with the surgeon to see if there is still a surgical  option.  Previously, he was seen by Lebron Conners, M.D. and he at that time  had recommended medical therapy.  I will treat his ________ issues later.   TECHNIQUE OF PROCEDURE:  Under usual sterile precautions using a 5-French  left femoral arterial access, initially a Wholey wire and then a 0.035 inch  Glidewire and then a 0.014 inch PT2 medium support 185 x 0.014 inch  guidewire was utilized and multiple attempts were made to penetrate through  the true lumen.  Because of inability to cross into the true lumen, the  procedure was abandoned.  During the procedure patient received 4000 units  intravenous heparin.  The patient tolerated the procedure well.  No  complications were noted immediately.                                               Cristy Hilts. Jacinto Halim, M.D.    Pilar Plate  D:  12/19/2002  T:  12/19/2002  Job:  [id number]

## 2010-09-19 NOTE — Discharge Summary (Signed)
NAMEALFONSA, [name]                ACCOUNT NO.:  [id number]   MEDICAL RECORD NO.:  [id number]          PATIENT TYPE:  INP   LOCATION:  1440                         FACILITY:  [facility]   PHYSICIAN:  Sandria Bales. Ezzard Standing, M.D.  DATE OF BIRTH:  Jan 10, 1934   DATE OF ADMISSION:  08/19/2006  DATE OF DISCHARGE:  09/03/2006                               DISCHARGE SUMMARY   DISCHARGE DIAGNOSES:  1. Ventral incisional hernia.  2. Prolonged postoperative ileus after ventral incisional hernia      repair.  3. Postoperative tachycardia.  4. Malnutrition, placed on total parenteral nutrition.  5. History of claudication to the right leg.  6. Hypercholesterolemia.  7. Gastroesophageal reflux disease.  8. Hypertension.  9. Asthma, on Combivent.  10.Peripheral vascular disease.   OPERATION:  Laparoscopic repair of a ventral incisional hernia with  Proceed mesh on August 19, 2006.   HISTORY OF PRESENT ILLNESS:  Mr. Parada is a pleasant 76 year old black  male who is a patient of Dr. Earlene Plater Cloward's from the medical  standpoint.  He sees Dr. Cristy Hilts. [place] from the cardiac standpoint.  He  actually goes to the [facility] for a lot of his medications  and some medical evaluation.  I have taken care of him before when he  had a defect in his left groin during right to left femoral-femoral  bypass graft by Dr. Balinda Quails in June 2001.  The patient had a perforated appendix in March 2005, with a bowel  obstruction, taken care of by Dr. Currie Paris through a midline  incision.  He has done well from the appendectomy and appendicitis;  however, he has developed an abdominal wall hernia.  Dr. Jamey Ripa asked if  I would see him for evaluation for a laparoscopic repair of this hernia.  Mr. Kniss has no other significant GI disease.   PAST MEDICAL HISTORY:  1. He has claudication to his right leg, for which he takes Trental      and aspirin.  2. Hypercholesteremia.  3. Gastroesophageal reflux  disease.  4. History of hypertension.  5. History of asthma, on Combivent.  6. History of peripheral vascular disease, again for which he has been      seen by Liliane Bade and is now followed by Dr. Jacinto Halim.   HOSPITAL COURSE:  On the day of admission the patient underwent a  laparoscopic ventral incisional hernia repair with Proceed mesh.  I asked Dr. Jacinto Halim to see him with me in consultation because of his  history of cardiac disease.  He had developed a tachycardia  postoperatively and was transferred down to a step-down intensive care  unit for observation.  He was also started on heparin for his  tachyarrhythmia.  A CT scan was done to rule out pulmonary embolism.  There was no evidence of a pulmonary embolism.   LABORATORY DATA:  Showed a sodium of 131, potassium 3.7, chloride 106,  CO2 of 23, BUN 19.  Hemoglobin 11.9, white blood count 8200.   During this time he developed an increasing ileus.  By the fourth  day he  had become increasingly distended.  We rechecked his low blood work at  that time on the fourth day, which showed a hemoglobin of 11, hematocrit  35, white blood cell count 8400.  He had been placed on Cardizem for his  tachycardia and Lovenox for deep venous thrombosis prophylaxis.   Despite the NG tube, he remained distended.  It seemed like he was  getting slowly better; however, because of his persistent ileus and  symptoms, he had a percutaneous indwelling central catheter line placed  for TPN.  TPN was initiated on August 25, 2006.   By August 30, 2006, which was 11 days postoperative, his KUB continued to  show dilated small bowel.  He had a hemoglobin of 9.8, a white blood  cell count of 8200.  His potassium was 4.3, creatinine 1.06.  The CT  scan that was done showed dilated small bowel, but no evidence of  obstruction, with mesh in good position and air in his colon.  I then  removed his NG tube and started him on clear liquids and advanced his  diet.   By  Sep 03, 2006, his abdomen had gotten softer.  He was passing flatus  and having bowel movements.  He was advanced, ready for discharge.  He  was left on the Cardizem when he went home.   DISCHARGE INSTRUCTIONS:  1. A regular diet.  2. He should not do any lifting for six weeks.  3. No driving for three or four days.  4. He can shower.   DISCHARGE MEDICATIONS:  1. He was given Vicodin for pain.  2. Diltiazem PR 380 mg daily.  3. Metoprolol 50 mg b.i.d.  4. Cozaar 100 mg daily.  5. Zocor 10 mg at bedtime.  6. Combivent.  7. Claritin 10 mg daily.  8. Guaiphenesin 600 mg b.i.d.  9. Protonix 40 mg daily.  10.Tessalon 200 mg when needed, or p.r.n.  11.Singulair 10 mg daily.  12.Trental 40 mg t.i.d.  13.Aspirin 81 mg daily.   FOLLOWUP:  1. He will see me back in two to three weeks for followup.  2. He sees Dr. Jacinto Halim.  He has an appointment with him Sep 20, 2006, at      11:15 a.m.   CONDITION ON DISCHARGE:  Improved.      Sandria Bales. Ezzard Standing, M.D.  Electronically Signed     DHN/MEDQ  D:  10/01/2006  T:  10/01/2006  Job:  [id number]   cc:   Gabriel Earing, M.D.  Fax: [phone]   Cristy Hilts. Jacinto Halim, MD  Fax: [phone]

## 2010-09-19 NOTE — Op Note (Signed)
[name], [name]                ACCOUNT NO.:  [id number]   MEDICAL RECORD NO.:  [id number]          PATIENT TYPE:  AMB   LOCATION:  SDS                          FACILITY:  [facility]   PHYSICIAN:  Salley Scarlet., M.D.DATE OF BIRTH:  12/07/1933   DATE OF PROCEDURE:  DATE OF DISCHARGE:  12/23/2007                               OPERATIVE REPORT   PREOPERATIVE DIAGNOSIS:  Immature cataract, left eye.   POSTOPERATIVE DIAGNOSIS:  Immature cataract, left eye.   OPERATION:  Kelman phacoemulsification cataract, left eye.   ANESTHESIA:  Local using Xylocaine 2% and Marcaine 0.75% with Wydase.   JUSTIFICATION OF PROCEDURE:  This is a 77 year old gentleman who had a  cataract extraction from the right eye several years ago who now  complains of inability to see from the left eye.  He was evaluated and  found to have visual acuity best corrected to 20/25 on right but 20/400  on the left.  There was a intraocular lens present on the right, but there is 2 to 3+  nuclear sclerotic cataract of the left eye in addition to posterior  capsular opacity.  Cataract extraction was recommended.  He is admitted  this time for that purpose.   PROCEDURE:  Under influence of IV sedation, a Van Lint akinesia and  retrobulbar anesthesia was given.  The patient was prepped and draped in  the usual manner.  The lid speculum was inserted under upper and lower  lid of the left eye and a 4-0 silk traction suture was passed through  the belly of the superior rectus muscle retraction.  A fornix-based  conjunctival flap was turned and hemostasis was achieved using cautery.  An incision was made in the sclera to limbus.  This incision was  dissected down to clear cornea using a crescent blade.  A sideport  incision was made at 1:30 o'clock position.  OcuCoat was injected into  the eye through the sideport incision.  The anterior chamber was entered  through the corneoscleral tunnel incision at 11:30 o'clock  position  using 2.75-mm keratome.  An anterior capsulotomy was done using a bent  25-gauge needle.  The nucleus was hydrodissected using Xylocaine.  The  KPE handpiece was passed into the eye and the nucleus was emulsified  with a moderate amount of difficulty.  The residual cortical material  was aspirated.  The posterior capsule was polished using an Engineer, building services.  The wound was widened slightly to accommodate a foldable  silicone lens.  The lens was seated into the eye behind the iris without  difficulty.  The anterior chamber was reformed and the pupil constricted  using Miochol.  The lips of the wound were hydrated and tested to make  sure that there was no leak.  After ascertaining that there was no leak,  the conjunctiva was closed over the wound using thermal cautery.  1 mL  of Celestone and 0.5 mL of gentamicin were injected into the  subconjunctivally.  Maxitrol ophthalmic ointment and Pilocar ointment  were applied along with a patch and Fox shield.  The patient tolerated  the procedure well and was discharged to the postanesthesia recovery  room in satisfactory condition.  He was instructed to rest today, to  take Darvocet-N 100 every 4 hours as needed for pain, and see me in the  office tomorrow for further evaluation.   DISCHARGE DIAGNOSIS:  Immature cataract, left eye.      Salley Scarlet., M.D.  Electronically Signed     TB/MEDQ  D:  12/28/2007  T:  12/28/2007  Job:  [id number]

## 2010-09-19 NOTE — Op Note (Signed)
[place]. [facility]  Patient:    HANSFORD, HIRT                         MRN: [id number] Proc. Date: 10/03/99 Adm. Date:  [id number] Disc. Date: [id number] Attending:  Melvenia Needles CC:         Denman George, M.D.                           Operative Report  DATE OF BIRTH:  12-21-1933  PREOPERATIVE DIAGNOSIS:  Left femoral hernia.  POSTOPERATIVE DIAGNOSIS:  Left femoral hernia and iliac vascular disease.  OPERATION:  Left femoral hernia repair with mesh.  SURGEON:  Sandria Bales. Ezzard Standing, M.D.  FIRST ASSISTANT:  Denman George, M.D.  ANESTHESIA:  General endotracheal  ESTIMATED BLOOD LOSS:   Minimal.  INDICATION FOR PROCEDURE:  Mr. Sizelove was asleep in the operating room with a planned femoral to femoral bypass for significant left iliac artery disease by Dr. Denman George.  In dissecting out the left groin, Dr. Madilyn Fireman discovered a left femoral hernia and he asked me to see the patient.  The patient has about 3 x 4 cm protruding hernia sac which was comprised primarily of preperitoneal fat protruding under the inguinal ligament medial to the left femoral vein consistent with a left femoral hernia.  DESCRIPTION OF PROCEDURE:  I scrubbed extended his incision somewhat cephalad and to the right about 4 cm. Opened the external oblique fascia, encircled the cord structures with a Penrose drain.  The patient had maybe a minimal weakness in the internal ring.  No evidence of a direct inguinal hernia.  He did again have this glob of preperitoneal fat protruding inferior to the inguinal ligament consistent with femoral hernia.  This was reduced.  I then carried out an inguinal floor repair using a piece of 3 x 6 inch mesh cut to about 2 x 5 inches.  The mesh was sewn medially to the pubic bone, inferior to Coopers ligament making sure that the femoral canal area was well covered superiorly to transversalis fascia.  A key hole was cut through the  internal ring and then the tails of the mesh were sewn behind the internal ring.  I used 0 Novofil for the mesh repair.  I was able to admit the tip of my finger through the recreated internal ring. I then irrigated the wound. The cord structures were returned to their normal location.  The external oblique fascia was closed with an interrupted 3-0 Vicryl suture leaving again about a fingerbreadth opening for the external ring.  The patient tolerated this part of the operation well.  Then Dr. Madilyn Fireman was going to proceed with the femoral to femoral bypass graft. DD:  10/03/98 TD:  10/03/99 Job: [id number]

## 2010-09-19 NOTE — Op Note (Signed)
[name], [name]                ACCOUNT NO.:  [id number]   MEDICAL RECORD NO.:  [id number]          PATIENT TYPE:  AMB   LOCATION:  DAY                          FACILITY:  [facility]   PHYSICIAN:  Sandria Bales. Ezzard Standing, M.D.  DATE OF BIRTH:  June 13, 1933   DATE OF PROCEDURE:  08/19/2006  DATE OF DISCHARGE:                               OPERATIVE REPORT   PREOPERATIVE DIAGNOSIS:  Ventral incisional hernia.   POSTOPERATIVE DIAGNOSIS:  Ventral incisional hernia approximately 10 by  11 cm in diameter.   PROCEDURE:  Laparoscopic ventral incisional hernia repair with Proceed  mesh (20 by 20 cm in size).   SURGEON:  Sandria Bales. Ezzard Standing, M.D.   ANESTHESIA:  General.   ESTIMATED BLOOD LOSS:  Minimal.   INDICATIONS FOR PROCEDURE:  [name] is a 77 year old black gentleman  who had a perforated appendicitis with small bowel obstruction operated  on by Dr. Cicero Duck in March 2005.  He has developed an abdominal  wall hernia through his midline incision.  He now comes for laparoscopic  repair of this hernia.   The indications and potential complications of the hernia repair are  explained to the patient.  The potential complications include but are  not limited to infection, bleeding, bowel injury, and recurrence of the  hernia.   DESCRIPTION OF PROCEDURE:  The patient was placed in a supine position  and given general endotracheal anesthesia.  He had 1 gram Ancef at the  beginning of the procedure.  His abdomen was prepped with Betadine  solution and sterilely draped.  I then used an Ioban drape over his  abdominal wall.  After completing his draping, I then accessed his  abdominal cavity using an Optiview 11 mm Ethicon trocar in the left  upper quadrant.  I then placed three additional trocars, a 5 mm left  lower quadrant, 5 mm right upper quadrant, and 5 mm right lower  quadrant.  I spent probably about an hour taking down adhesions to his  anterior abdominal wall but this was primarily  omentum.  He did have a  little bit of small bowel but I used, primarily, cold scissors for the  dissection cauterizing only a few bleeders in the omentum.   After the hernia had been reduced, I then measured the hernia which  measured 10 cm vertically and 11 cm horizontally.  I then used a spinal  needle to  mark out our edges trying to get 4-5 cm around the hernia  defect.  I used a piece of 20 by 25 Proceed mesh I cut down to a 20 by  20 cm piece of mesh.  I placed nine sutures of 0 Novofil in a clockwise  fashion.  I then introduced the mesh into the abdominal cavity and  pulled the mesh up using these holding sutures and then used tacks  between the sutures going about every cm around the mesh.  Final tack  count was about 48 tacks.  I did put an inner circle of tacks.  After  the mesh had been tied in and tacked in, it  lay flat in the abdominal  cavity. I did take some pictures which I placed in the chart.  I thought  I had good coverage of the hernia defect, there were no gaps along the  edge of the mesh.  I then let the mesh down once and reinsufflated and  saw no evidence of bleeding and no evidence of trapped bowel.   I then removed my trocars and there was no bleeding at the trocar sites.  The trocar sites were closed with 0 Vicryl suture and the puncture sites  were closed with DuoDerm.  Sponge and needle count were correct at the  end of the case.  The wound was then sterilely dressed and an abdominal  binder placed on the patient.  He will be admitted for observation.      Sandria Bales. Ezzard Standing, M.D.  Electronically Signed     DHN/MEDQ  D:  08/19/2006  T:  08/19/2006  Job:  [id number]   cc:   Gabriel Earing, M.D.  Fax: [phone]   Cristy Hilts. Jacinto Halim, MD  Fax: [phone]

## 2010-09-19 NOTE — Discharge Summary (Signed)
[name], BALFOUR                            ACCOUNT NO.:  [id number]   MEDICAL RECORD NO.:  [id number]                   PATIENT TYPE:  INP   LOCATION:  3715                                 FACILITY:  [facility]   PHYSICIAN:  Cristy Hilts. Jacinto [name], M.D.                  DATE OF BIRTH:  [birth year]   DATE OF ADMISSION:  12/19/2002  DATE OF DISCHARGE:  12/22/2002                                 DISCHARGE SUMMARY   ADMISSION DIAGNOSES:  1. Lower extremity peripheral vascular disease with claudication.  2. History of peripheral vascular disease.     A. Status post left femoral-to-femoral bypass graft in June 2001, by Dr.        Liliane [name].  3. Hypertension.  4. Dyslipidemia.   DISCHARGE DIAGNOSES:  1. Status post peripheral vascular angiogram on December 19, 2002, by [name].  2. Status post groin E/bleed postprocedure which was difficult to stop.  3. Lower extremity peripheral vascular disease with claudication.  4. History of peripheral vascular disease.     A. Status post left femoral-to-femoral bypass graft in June 2001, by Dr.        Liliane [name].  5. Hypertension.  6. Dyslipidemia.   HISTORY OF PRESENT ILLNESS:  [name] is a very pleasant 77 year old  African-American male with a history of hypertension, hyperlipidemia, and  peripheral vascular disease who had been seeing Dr. Jacinto [name] for lifestyle-  limiting claudication of the left lower extremity.  Recent ABIs showed right  ABI 0.25 and left 0.49.  He recently had peripheral angiography which  revealed occluded left iliac artery in the proximal segment which  reconstituted in the mid segment.  There was severe disease in the left SFA  which occluded in the mid segment.  Anterior tibial was occluded in the  proximal segment.  The posterior tibial was occluded and with one-vessel run-  off via the peroneal.  On the right there was 90% SFA stenosis followed by  short-segment occlusion.  Again, there was severe disease at the  posterior  tibial and anterior tibial in the leg, with mostly one-vessel runoff.  The  past right-to-left femoral bypass graft was patent.  However, there was  severe disease progression of the native SFA.   At that time [name] was experiencing lifestyle-limiting claudication.  Dr. Jacinto [name] planned to proceed with definitive angiography and hopeful  percutaneous intervention.  The risks and benefits of the procedure were  discussed, and the patient was willing to proceed.   HOSPITAL COURSE:  On December 19, 2002, [name] underwent peripheral  vascular angiogram with [name].  He was found to have an occluded  left iliac.  He was unable to recanalize the left common internal/external  iliac artery.  Therefore, at that time we planned for medical therapy.  He  was uncertain whether we would consider right  SFA intervention at a later  date.  The patient tolerated the procedure well and had no complications  with the procedure.  Dr. Jacinto [name] planned for possible discharge home later  that evening if stable.   Postprocedure [name] had some brief uncontrolled hypertension which was  later controlled with labetalol.  Initially blood pressure was up to about  198/104 but came down to normal range quickly thereafter.  However, he did  have some groin ooze.  Therefore, we planned for admission overnight to  monitor his blood pressure and groin site.   On the morning of December 20, 2002, [name] continued to have groin  oozing.  His blood pressure, however, was controlled at 120/70.  We planned  to continue bed rest with pressure dressing and recheck later in the  afternoon.   On the afternoon of December 20, 2002, he continued to have episodes of oozing  and bleeding.  Recurrent wedge pressure dressings had been placed.  The  groin site had been held twice by the nursing staff during that day.   On the morning of December 21, 2002, he continued to have some groin oozing.  Again we  placed further bandages, applied more pressure, and continued bed  rest.   Also note that during the evening of December 20, 2002, he complained of  abdominal pain, and the on-call physician ordered a CT scan to rule out  retroperitoneal hemorrhage, and this was negative for hemorrhage.   Later on the afternoon of December 21, 2002, [name] was rechecked and was  still having oozing.  Therefore, at that point we injected around the site  with 1% epinephrine/lidocaine injection.  The oozing ceased, and we  reapplied a very loose bandage so that when we removed the bandage it would  not again irritate the area.   On the morning of December 22, 2002, [name] says he has had no further  ooze since his injections.  He has gotten out of the bed, gone to the  bathroom without difficulties.   At this point he is afebrile at 98, pulse rate about 90, blood pressure  110/70, oxygen saturation 93% on room air.  At this point his groin site has  no bleeding, no hematoma, no oozing.  We will have him ambulate in the  hallway and, if his groin remains stable, will discharge home.   He did ambulate all the way down the hallway and back and had no oozing from  the groin site.  He was seen and evaluated by [name] who deemed him  stable for discharge home.   HOSPITAL CONSULTS:  None.   HOSPITAL PROCEDURES:  Lower extremity peripheral vascular angiography on  December 19, 2002, by [name].  He was found to have an occluded left  iliac which was unable to be recanalized.  Therefore, no intervention  performed.   LABORATORIES:  On December 21, 2002, sodium 135, potassium 3.4 which was  repleted with K-Dur 40.  BUN 9, and creatinine 1, glucose 131.  White count  8.1, hemoglobin 11.6, hematocrit 34.5, platelets 229.  Preprocedure  laboratories were drawn as an outpatient and were stable.   Radiology:  CT scan showed no retroperitoneal hemorrhage.   DISCHARGE MEDICATIONS: 1. Do not take your  aspirin until Monday.  At that time resume 81 mg a day.  2. Do not take Plavix until Monday.  At that time resume Plavix 75 mg once a  day.  3. Do not take Pletal until Monday.  On Monday you can resume Pletal 100 mg     b.i.d.  4. Go ahead and take the following:     A. Accupril 20 mg once a day.     B. Zocor 20 mg once a day.     C. Toprol-XL 50 mg once a day.   DISCHARGE INSTRUCTIONS:  1. No strenuous activity, lifting greater than five pounds, driving, or     sexual activity for five days.  2. Low-cholesterol diet.  3. May gently was the groin site on Sunday.  4. Call [phone] for any bleeding or increased size or pain at the groin     site.  5. Follow up with Dr. Jacinto [name] January 04, 2003, at 9:15 a.m.      Mary B. Remer Macho, P.A.-C.                   Cristy Hilts. Jacinto [name], M.D.    MBE/MEDQ  D:  12/22/2002  T:  12/24/2002  Job:  [id number]   cc:   Gabriel Earing, M.D.  [address]  [place]  [place] [address]  Fax: [phone]

## 2010-09-19 NOTE — Discharge Summary (Signed)
NAMEKEVAUGHN, EWING                            ACCOUNT NO.:  [id number]   MEDICAL RECORD NO.:  [id number]                   PATIENT TYPE:  OIB   LOCATION:  6527                                 FACILITY:  [facility]   PHYSICIAN:  Cristy Hilts. Jacinto Halim, M.D.                  DATE OF BIRTH:  Jul 12, 1933   DATE OF ADMISSION:  10/10/2002  DATE OF DISCHARGE:  10/11/2002                                 DISCHARGE SUMMARY   DISCHARGE DIAGNOSES:  1. Claudication.  2. Known peripheral vascular disease with left femorofemoral bypass grafting     in June of 2001 by Balinda Quails, M.D.  3. Hypertension.  4. Dyslipidemia.   HOSPITAL COURSE:  The patient is a 77 year old male with a history of  peripheral vascular disease.  He had a left to right fem-fem bypass grafting  in June of 2001 for an occluded left iliac.  He has claudication in both  lower extremities.  He was seen as a referral from Dr. Andi Devon for a second  opinion.  The patient had been on Pletal, but had some edema and stopped  this.  He does have risk factors for coronary disease and he was sent for a  Cardiolite study which was done on Sep 27, 2002, which was essentially  normal with some diaphragmatic attentuation and an EF of 50%.  He underwent  outpatient angiogram on October 10, 2002, by Dr. Jacinto Halim.  This revealed an  occluded left iliac with a patent fem-fem bypass graft with an occluded left  SFA and 90% mid occlusion of the right SFA.  Dr. Jacinto Halim will see him back in  followup on October 23, 2002.  Reattempt at opening the left SFA via antegrade  right femoral approach will be discussed.  We did add Coreg for  hypertension.  He will be following up with Dr. Jacinto Halim on October 23, 2002, at  12:15 p.m.   DISCHARGE MEDICATIONS:  1. Coreg 6.25 mg b.i.d.  2. Accupril 20 mg a day.  3. Zocor 20 mg a day.  4. Aspirin 81 mg a day.  5. Plavix 75 mg a day.   LABORATORIES:  Chest x-ray showed no evidence of active disease.  There is  some right apical  scarring, which is old.  Preoperative labs showed a white  count of 4.5, hemoglobin 12.1, hematocrit 36.4, platelets 278, INR 1.16, TSH  0.78, BUN 8, creatinine 0.8, sodium 146, and potassium 3.7.  The urinalysis  was unremarkable.  The EKG showed sinus rhythm and sinus tachycardia.   DISPOSITION:  The patient is discharged in stable condition and will follow  up with Dr. Jacinto Halim on October 23, 2002.     Abelino Derrick, P.A.                      Cristy Hilts. Jacinto Halim, M.D.    Lenard Lance  D:  10/11/2002  T:  10/11/2002  Job:  [id number]   cc:   Gabriel Earing, M.D.  [address]  [place]  [place] [address]  Fax: [phone]

## 2010-09-19 NOTE — Discharge Summary (Signed)
[place]. [facility]  Patient:    [name], [name]                         MRN: [id number] Adm. Date:  [id number] Disc. Date: [id number] Attending:  Melvenia Needles Dictator:   Eugenia Pancoast, P.A. CC:         Denman George, M.D.             Sandria Bales. Ezzard Standing, M.D.             Meliton Rattan, M.D., [facility]                           Discharge Summary  DATE OF BIRTH:  01-02-1934  FINAL DIAGNOSES: 1. Left iliac occlusive disease. 2. Left femoral hernia. 3. History of asthma. 4. History of tobacco use.  PROCEDURE:  October 03, 1999, left femoral hernia repair with mesh by Dr. Ovidio Kin.  October 03, 1999, right to left femoral-to-femoral bypass with a 7 mm Dacron graft.  SURGEON:  Denman George, M.D.  HISTORY OF PRESENT ILLNESS:  This is a 77 year old gentleman who was referred to Dr. Madilyn Fireman by Dr. Meliton Rattan from [facility] because of complaints of left lower extremity claudication.  Patient gives a long history of left calf claudication.  Patient stopped cutting his grass due to this discomfort.  Over the past month or so, he has had worsening symptoms with pain in his left foot and numbness.  Patient underwent Doppler evaluation that revealed ankle brachial indices of 0.49 on the left and 1.0 on the right.  Wave forms were consistent with left aortoiliac occlusive disease.  Because of these findings, surgery was advised.  Risks and benefits of the surgery were explained to the patient.  Patient understood and agreed to surgery.  HOSPITAL COURSE:  Patient was admitted to [facility] on October 03, 1999. At that time, he was also noted to have a left femoral hernia.  Subsequently, Dr. Ovidio Kin was consulted and patient underwent a left femoral hernia repair with mesh done by Dr. Ezzard Standing and assisted by Dr. Madilyn Fireman.  Then, the surgery was continued by Dr. Madilyn Fireman.  Immediately after this, the patient underwent the right-to-left  femoral-to-femoral bypass with a 7 mm Dacron graft.  The patient tolerated both surgeries well.  No intraoperative complications occurred.  Postoperatively, patient did satisfactorily. Initially, he did have an elevated temperature on the first postoperative day to 101.  His incisions were all healing satisfactorily.  He did have a good palpable graft pulse.  The feet were warm and dry bilaterally.  ABIs were done which indicated no significant change on the right with a significant increase on the left postoperatively.  His right ABI was 1.0, his left ABI was 0.89. He continued to progress in a satisfactory manner.  He did have a problem with voiding after the catheter was removed.  This subsequently had to be replaced and it was again discontinued on October 06, 1999.  In the afternoon at approximately lunchtime, patient had a bowel movement and at that point, also, began to void without problems.  After that, he voided without difficulty.  No other incidents of urinary retention occurred prior to his discharge.  He did have noted tachycardia whenever he was up and ambulating though this was not in itself severe.  He was ambulating without difficulty having no complaints.  His graft sites were healing satisfactorily.  He was prepared for discharge. The incisions as noted were healing.  They had been closed with staples.  The staples will remain in for approximately five more days.  They will be discontinued at the office of Dr. Madilyn Fireman on Friday, June 8, at 9:30 a.m. Patient was subsequently discharged home in satisfactory and stable condition on October 07, 1999.  DISCHARGE MEDICATIONS: 1. Enteric-coated aspirin 325 mg q.d. 2. Percocet 5/325 mg one to two p.o. q.4-6h. p.r.n. pain. DD:  10/06/99 TD:  10/08/99 Job: [id number]

## 2010-09-19 NOTE — Procedures (Signed)
[place]. [facility]  Patient:    [name], [name]                         MRN: [id number] Proc. Date: 09/30/99 Attending:  Denman George, M.D. CC:         Denman George, M.D.             Meliton Rattan, M.D.                           Procedure Report  DIAGNOSIS:  Left lower extremity claudication.  PROCEDURE:  Mid-abdominal aortogram with bilateral lower extremity runoff arteriography.  ACCESS:  Right common femoral artery, 5-French sheath.  CONTRAST USED:  Visipaque 145 mL (generalized severe debilitation).  COMPLICATIONS:  None apparent.  CLINICAL NOTE:  This is a 77 year old male who was referred for evaluation of left lower extremity claudication.  Physical examination revealed evidence of marked reduction in pulses in the left lower extremity.  Left ankle brachial index was 0.49.  Wave forms were consistent with left iliac occlusive disease.  PROCEDURE NOTE:  The patient was brought to the [facility] catheterization lab in stable condition.  He was placed in the supine position.  Administered 2 mg Nubain, 2 mg Versed intravenously.  Skin and subcutaneous tissue in the right groin instilled with 1% Xylocaine.  A needle was easily induced into the right common femoral artery.  A 0.035 J-wire passed through the needle, and under fluoroscopic control passed into the mid-abdominal aorta.  A 5-French sheath was then passed over the guide wire, positioned in the right common femoral artery, the dilator removed and the sheath flushed with heparin and saline solution.  Over the guide wire was placed a standard pigtail catheter, which was positioned in the mid-abdominal aorta.  Standard AP mid-abdominal aortogram obtained.  This revealed single bilateral renal arteries which are widely patent.  Brisk flow was noted through the superior mesenteric artery also. The infrarenal aorta revealed mild to moderate calcified plaque without dominant  stenosis.  The pigtail catheter was brought down to the aortic bifurcation.  AP pelvic arteriogram obtained.  This revealed widely patent right common iliac artery, with continuous flow into the external iliac artery.  The right internal iliac artery revealed a high-grade stenosis at its origin.  The left iliac system revealed the proximal common iliac artery to be widely patent.  The left external iliac artery was occluded at its origin. The left internal iliac artery was patent, but had multiple high-grade stenoses.  Left lower extremity runoff arteriography obtained.  This revealed reconstitution of the left common femoral artery, at the level of the inguinal ligament, by pelvic collaterals.  The left lower extremity runoff revealed reconstitution of the left common femoral artery.  There was patency of the left superficial femoral and profundus femoris arteries.  The left superficial femoral artery revealed multiple mild to moderate stenoses in the thigh and at the abductor canal. There was continuous flow into the left popliteal artery.  At the trifurcation dominant runoff was noted to be the left peroneal artery.  There were multiple stenoses in the tibial vessels, with reconstitution of the anterior tibial artery in the distal thigh.  The pigtail catheter was then removed over the guide wire.  Retrograde injection made through the right common femoral sheath.  This revealed a widely patent right external iliac artery.  The right common femoral artery was  also widely patent.  The right profundus femoris artery was normal.  There was a moderate stenosis at the origin of the right superficial femoral artery.  The right superficial femoral artery was patent in the right thigh, with multiple mild stenoses.  There was a moderate stenosis at the junction of the popliteal and superficial femoral arteries.  The right popliteal artery provided continuous flow to the trifurcation.  There  was stenosis at the origin of the right posterior tibial and tibioperoneal trunk.  Right anterior tibial artery occluded in the proximal calf.  Dominant runoff was via a diseased right peroneal artery, with reconstitution of the distal anterior tibial artery and dorsalis pedis at the level of the ankle.  This completed the arteriogram procedure.  The patient tolerated the procedure well without apparent complication.  Transferred to the holding area in stable condition for removal of right femoral sheath.  FINAL IMPRESSION: 1. Left external iliac occlusion. 2. Mild bilateral superficial femoral artery occlusive disease. 3. 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  DISPOSITION:  These results have been discussed with the patient and family. It was recommended that the patient undergo right-to-left femoral-femoral bypass for relief of significant limiting claudication of the left lower extremity.  This will be scheduled for later this week. DD:  09/30/99 TD:  10/01/99 Job: [id number]

## 2010-09-19 NOTE — Op Note (Signed)
[place]  Patient:    [name], [name]                         MRN: [id number] Proc. Date: 10/03/99 Adm. Date:  [id number] Disc. Date: [id number] Attending:  Melvenia Needles CC:         Sandria Bales. Ezzard Standing, M.D.             Dr. Meliton Rattan                           Operative Report  PREOPERATIVE DIAGNOSES: 1. Left iliac occlusion. 2. Left lower extremity claudication.  POSTOPERATIVE DIAGNOSES: 1. Left iliac occlusion. 2. Left lower extremity claudication.  PROCEDURE:  Right-to-left femoral-femoral bypass graft with 7 mm Hemashield Dacron graft.  SURGEON:  Denman George, M.D.  ASSISTANT:  Nurse.  ANESTHESIA:  General endotracheal.  ANESTHESIOLOGIST:  ________.  CLINICAL NOTE:  This is a 77 year old male who is referred to CVTS for evaluation of left lower extremity claudication.  He underwent arteriography revealing a left iliac occlusion.  This was too extensive to undergo angioplasty or stent placement.  It was recommended that the patient undergo a right-to-left femoral-femoral bypass.  The patient consented.  During the operative procedure, the patient was found to have a left femoral hernia.  Sandria Bales. Ezzard Standing, M.D. was consulted intraoperatively and carried out a left femoral hernia repair, dictated under a separate heading.  DESCRIPTION OF PROCEDURE:  The patient was brought to the operating room in stable condition.  Placed in the supine position.  General endotracheal anesthesia induced.  Both legs were prepped and draped in a sterile fashion. A longitudinal skin incision was made through the right groin.  Dissection was carried down through the subcutaneous tissue.  Lymphatics divided with electrocautery.   The inguinal ligament identified.  The common femoral artery was encircled proximally with vessel loops.  Distal dissection carried down to expose the profunda and superficial femoral artery.  The profunda femoris origin  was encircled with a vessel loop.  The arteriogram previously performed did reveal stenosis at the origin of the right superficial femoral artery. Therefore, the right superficial femoral artery was exposed distally and encircled with a vessel loop beyond its origin.  A left longitudinal groin incision was made at the inguinal ligament. Dissection was carried down through the subcutaneous tissue.  The lymphatics were divided with electrocautery.  There was an obvious left femoral hernia present.  Dr. Ovidio Kin was consulted intraoperatively and carried out a left femoral hernia repair with mesh.  Attention then placed on the left femoral vessels.  The left common femoral artery exposed and the inguinal ligament encircled with a vessel loop.  No palpable pulse present in this.  Distal dissection carried down to expose the origin of the profunda femoris artery which was also encircled with a vessel loop.  The superficial femoral artery exposed distally beyond its origin and encircled with a vessel loop.  The subcutaneous suprapubic tunnel was made between the two incisions.  A 7 mm Dacron graft placed through the tunnel.  The patient was administered 5000 units of heparin intravenously.  Adequate circulation time permitted.  The right femoral vessel was controlled with clamps.  A longitudinal arteriotomy made in the right common femoral artery. The arteriotomy extended across the origin of the superficial femoral artery where there was plaque filled up and beyond the plaque.  There  was minimal endarterectomy carried out at the origin of the right superficial femoral artery.  The Dacron graft was beveled and anastomosed end-to-side to the right common femoral artery, extending the graft out under the right superficial femoral artery origin using a running 6-0 Prolene suture.  The graft then placed through the suprapubic tunnel to the left groin.  The left femoral vessels controlled  with clamps.  A longitudinal arteriotomy made in the left common femoral artery.  The arteriotomy extended across the origin of the left superficial femoral artery.  The left superficial femoral artery origin revealed a mild stenosis and the arteriotomy extended beyond this.  The left limb of the graft beveled and anastomosed end-to-side to the left common femoral artery extending out on the left superficial femoral artery origin in an end-to-side fashion with running 6-0 Prolene suture.  The clamps were then released and adequate flushing carried out.  The graft was patent with excellent pulse.  Both groin wounds were irrigated with antibiotic solution.  The deep layer was closed with running 2-0 Vicryl suture.  Subcutaneous and superficial layer closed with a running 3-0 Vicryl suture.  Staples applied to the skin.  A sterile dressing was applied.  The patient tolerated the procedure well.  Transferred to the recovery room in stable condition. DD:  10/03/99 TD:  10/07/99 Job: [id number]

## 2010-11-23 ENCOUNTER — Emergency Department (HOSPITAL_COMMUNITY): Payer: Medicare Other

## 2010-11-23 ENCOUNTER — Inpatient Hospital Stay (HOSPITAL_COMMUNITY)
Admission: EM | Admit: 2010-11-23 | Discharge: 2010-11-26 | DRG: 176 | Disposition: A | Discharge status: Home Health | Payer: Medicare Other | Attending: Internal Medicine | Admitting: Internal Medicine

## 2010-11-23 DIAGNOSIS — N179 Acute kidney failure, unspecified: Secondary | ICD-10-CM | POA: Diagnosis present

## 2010-11-23 DIAGNOSIS — IMO0001 Reserved for inherently not codable concepts without codable children: Secondary | ICD-10-CM | POA: Diagnosis present

## 2010-11-23 DIAGNOSIS — I2699 Other pulmonary embolism without acute cor pulmonale: Principal | ICD-10-CM | POA: Diagnosis present

## 2010-11-23 DIAGNOSIS — M7530 Calcific tendinitis of unspecified shoulder: Secondary | ICD-10-CM | POA: Diagnosis present

## 2010-11-23 DIAGNOSIS — J45901 Unspecified asthma with (acute) exacerbation: Secondary | ICD-10-CM | POA: Diagnosis present

## 2010-11-23 DIAGNOSIS — M25519 Pain in unspecified shoulder: Secondary | ICD-10-CM | POA: Diagnosis present

## 2010-11-23 DIAGNOSIS — I739 Peripheral vascular disease, unspecified: Secondary | ICD-10-CM | POA: Diagnosis present

## 2010-11-23 DIAGNOSIS — I10 Essential (primary) hypertension: Secondary | ICD-10-CM | POA: Diagnosis present

## 2010-11-23 LAB — DIFFERENTIAL
Basophils Absolute: 0 10*3/uL (ref 0.0–0.1)
Eosinophils Relative: 7 % — ABNORMAL HIGH (ref 0–5)
Lymphocytes Relative: 34 % (ref 12–46)
Lymphs Abs: 1.9 10*3/uL (ref 0.7–4.0)
Neutro Abs: 2.7 10*3/uL (ref 1.7–7.7)
Neutrophils Relative %: 49 % (ref 43–77)

## 2010-11-23 LAB — BASIC METABOLIC PANEL
Calcium: 9.8 mg/dL (ref 8.4–10.5)
GFR calc Af Amer: 47 mL/min — ABNORMAL LOW (ref >60)
GFR calc non Af Amer: 38 mL/min — ABNORMAL LOW (ref >60)
Glucose, Bld: 138 mg/dL — ABNORMAL HIGH (ref 70–99)
Potassium: 3.6 mEq/L (ref 3.5–5.1)
Sodium: 140 mEq/L (ref 135–145)

## 2010-11-23 LAB — CBC
HCT: 41.1 % (ref 39.0–52.0)
Hemoglobin: 12.8 g/dL — ABNORMAL LOW (ref 13.0–17.0)
MCHC: 31.1 g/dL (ref 30.0–36.0)
RBC: 4.97 MIL/uL (ref 4.22–5.81)
WBC: 5.6 10*3/uL (ref 4.0–10.5)

## 2010-11-23 LAB — CK TOTAL AND CKMB (NOT AT ARMC)
CK, MB: 1.5 ng/mL (ref 0.3–4.0)
Relative Index: INVALID (ref 0.0–2.5)
Total CK: 91 U/L (ref 7–232)

## 2010-11-23 LAB — TROPONIN I: Troponin I: <0.3 ng/mL (ref <0.30)

## 2010-11-23 MED ORDER — TECHNETIUM TO 99M ALBUMIN AGGREGATED
4.1000 | Freq: Once | INTRAVENOUS | Status: AC | PRN
  Administered 2010-11-23: 4.1 via INTRAVENOUS

## 2010-11-23 MED ORDER — XENON XE 133 GAS
4.5000 | GAS_FOR_INHALATION | Freq: Once | RESPIRATORY_TRACT | Status: AC | PRN
  Administered 2010-11-23: 4.5 via RESPIRATORY_TRACT

## 2010-11-23 NOTE — H&P (Signed)
[name], [name]                ACCOUNT NO.:  [id number]  MEDICAL RECORD NO.:  [id number]  LOCATION:  WLED                         FACILITY:  [facility]  PHYSICIAN:  Tana Felts, MD     DATE OF BIRTH:  November 19, 1933  DATE OF ADMISSION:  11/23/2010 DATE OF DISCHARGE:                             HISTORY & PHYSICAL   CHIEF COMPLAINTS:  Right shoulder pain.  HISTORY OF PRESENT ILLNESS:  This is a 77 year old gentleman with a history of hypertension, asthma, and fairly severe peripheral arterial disease, who comes in complaining of right shoulder pain for the past 2 days.  He actually noticed that he has had some neck pain and tenderness for 2-3 weeks on the right side that spread to his shoulder and even a little bit down to his elbow yesterday fairly suddenly.  He says it is a heavy feeling in that his shoulder sometimes clicks.  He is now also complaining of a little bit of pain in the left elbow as well.  When he was admitted, he got imaging of his shoulder and it was felt that his pain was most consistent with a musculoskeletal injury, although the patient denies any trauma.  However, he was noted to be tachycardic to 137 on admission and was sent for a V/Q scan to rule out a PE as he had a D-dimer which is mildly elevated.  The V/Q scan was high probability, so he should be admitted for an acute pulmonary embolism.  He was given 1 dose of Lovenox in the emergency room.  The patient notes that otherwise he is feeling pretty well.  He says his asthma always acts up around this time of year, but that his symptoms are only about a 5/10.  He denies any GI or respiratory symptoms, chest pain, neurological symptoms.  He does have stable claudication, worse in his left lower extremity.  His right leg has had stent placed.  REVIEW OF SYSTEMS:  Negative except as mentioned above.  The patient's wife also notes that his voice has been hoarse and rattles lately and he has a little trouble  swallowing.  PAST MEDICAL HISTORY: 1. Asthma. 2. Hypertension. 3. Hernia. 4. High cholesterol. 5. Peripheral arterial disease.  PAST SURGICAL HISTORY: 1. Hernia repair. 2. Iliac artery stent. 3. Femorofemoral artery vascular bypass.  SOCIAL HISTORY:  He quit smoking and drinking about 16 years ago, no history of drug abuse.  He is retired, he used to work as a Counsellor for [employer].  He lives with his wife.  FAMILY HISTORY:  His father had an MI.  His sister has an MI or stroke or aneurysm.  His mother was healthy until old age.  He had 1 uncle with a history of cancer.  No history of cardiomyopathies in the family.  ALLERGIES:  No known drug allergies.  MEDICATIONS: 1. Diltiazem 180 mg once a day. 2. Klor-Con M20 one tablet daily. 3. Metoprolol 100 mg twice a day. 4. Aspirin 81 mg daily. 5. Plavix 75 mg daily. 6. Lasix 40 mg daily. 7. Simvastatin 10 mg daily. 8. Pantoprazole 40 mg daily.  PHYSICAL EXAMINATION:  VITAL SIGNS:  Temperature 98.2, pulse  100, blood pressure 121/71, respiratory rate 15, satting 95% on room air. GENERAL:  This is a somewhat thin, but otherwise well-appearing elderly gentleman in no acute distress. HEENT:  Pupils are equal, round, reactive to light.  Extraocular movements are intact.  His oropharynx is clear, he has moist mucous membranes.  No thrush.  He is edentulous on the top, says he has left his dentures at home. NECK:  Tender in the anterior triangle just above the clavicle on the right, otherwise supple.  No lymphadenopathy. RIGHT SHOULDER:  His right shoulder is tender to palpation.  The arm is not swollen. LUNGS:  Clear to auscultation bilaterally. BACK:  Notable for multiple large hyperpigmented lesions bilaterally. CARDIOVASCULAR:  Regular rate and rhythm.  No murmurs, rubs, or gallops. ABDOMEN:  Soft, nontender, nondistended.  Normal bowel sounds. EXTREMITIES:  Warm and well perfused with the exception of  decreased pulse in the left foot, however, the foot is warm.  No swelling or tenderness and his right shoulder is tender as mentioned above. NEUROLOGIC:  He is grossly intact with no focal deficits. SKIN:  Only with hyperpigmented lesions as mentioned in chest exam.  LABORATORY DATA:  White count 5.6, hemoglobin 12.8, hematocrit 41.1, platelets 227.  Creatinine is 1.74 which is a little above his baseline of about 1.2.  Cardiac enzymes negative x1, D-dimer is elevated at 2.2.  IMAGING:  C-spine, no acute findings, moderate-to-severe degenerative disk disease from C4-C7 with some mild bilateral neural foraminal narrowing, some spurring, mild bilateral facet DJD.  Right shoulder x- ray, no acute findings, mild soft tissue calcification near the rotator cuff insertion site suspicious for calcific tendonitis.  Chest x-ray, stable left lower lobe scarring and no active disease.  He had a high probability V/Q scan.  ASSESSMENT:  This is a 77 year old gentleman with a presenting complaint of right shoulder pain, but an incidental finding of bilateral pulmonary emboli worked up in the context of tachycardia.  It is possible that the pain is referred pain from pleural irritation, though it seems more likely that it is unrelated musculoskeletal injury, perhaps calcific tendonitis.  It also could be related to his cervical spine disease.  I am a little concerned that the right neck pain could be related to a clot in that area, though identification of this would not necessarily change our management.  I am also concerned given that he has some temporal wasting that he may have an occult malignancy which provoked his pulmonary embolism.  PLAN: 1. For his PE, he was started on daily Lovenox.  This may be able to     be increased to twice a day if his renal function improves.  I also     ordered a PSA and a Hemoccult as screens for cancer.  Given     his hoarseness, I am a little concerned there  could be some sort of     esophageal or throat cancer, but I think we will start with     noninvasive screening first and he may merit an ENT consult or     outpatient followup to work up his hoarseness. 2. Right shoulder pain.  This is likely musculoskeletal in nature.  If     it does not improve with anticoagulation, he should probably be set     up for followup with Ortho as an outpatient.  In the meantime, we     will do pain control with Tylenol and oxycodone. 3. Asthma.  The patient's respiratory  status is stable.  This can be     treated symptomatically if it becomes a problem. 4. Peripheral arterial disease.  The patient states that he had been     talking to his vascular surgeon, Dr. Allyson Sabal about potential surgery,     although he is not particularly eager to do so on the left leg     which apparently is nearly 99% clotted.  I do not think there is     any acute issue here, but it might be worthwhile touching base with     Dr. Allyson Sabal to discuss if this PE changes management of his     peripheral vascular disease at all.  I held his Plavix, but     continued his aspirin in the context of his Lovenox. 5. Blood pressure.  Continued the patient's diltiazem and metoprolol. 6. Acute renal dysfunction.  We will send urine studies for workup.    Tana Felts, MD     NB/MEDQ  D:  11/23/2010  T:  11/23/2010  Job:  [id number]  Electronically Signed by Tana Felts M.D. on 11/23/2010 11:02:26 PM

## 2010-11-24 ENCOUNTER — Inpatient Hospital Stay (HOSPITAL_COMMUNITY): Payer: Medicare Other

## 2010-11-24 DIAGNOSIS — I2699 Other pulmonary embolism without acute cor pulmonale: Secondary | ICD-10-CM

## 2010-11-24 LAB — CBC
MCV: 83.5 fL (ref 78.0–100.0)
Platelets: 194 10*3/uL (ref 150–400)
RBC: 4.48 MIL/uL (ref 4.22–5.81)
RDW: 14.9 % (ref 11.5–15.5)
WBC: 4.5 10*3/uL (ref 4.0–10.5)

## 2010-11-24 LAB — URINALYSIS, ROUTINE W REFLEX MICROSCOPIC
Bilirubin Urine: NEGATIVE
Glucose, UA: NEGATIVE mg/dL
Ketones, ur: NEGATIVE mg/dL
Nitrite: NEGATIVE
Protein, ur: NEGATIVE mg/dL
pH: 5 (ref 5.0–8.0)

## 2010-11-24 LAB — COMPREHENSIVE METABOLIC PANEL
BUN: 18 mg/dL (ref 6–23)
CO2: 27 mEq/L (ref 19–32)
Calcium: 8.9 mg/dL (ref 8.4–10.5)
Chloride: 101 mEq/L (ref 96–112)
Creatinine, Ser: 1.8 mg/dL — ABNORMAL HIGH (ref 0.50–1.35)
GFR calc non Af Amer: 37 mL/min — ABNORMAL LOW (ref >60)
Total Bilirubin: 0.7 mg/dL (ref 0.3–1.2)

## 2010-11-24 LAB — PHOSPHORUS: Phosphorus: 4.3 mg/dL (ref 2.3–4.6)

## 2010-11-24 LAB — SODIUM, URINE, RANDOM: Sodium, Ur: 25 mEq/L

## 2010-11-24 LAB — MAGNESIUM: Magnesium: 2 mg/dL (ref 1.5–2.5)

## 2010-11-24 LAB — PROTIME-INR: Prothrombin Time: 15.6 seconds — ABNORMAL HIGH (ref 11.6–15.2)

## 2010-11-24 LAB — APTT: aPTT: 39 seconds — ABNORMAL HIGH (ref 24–37)

## 2010-11-25 LAB — LUPUS ANTICOAGULANT PANEL
DRVVT: 42.3 secs (ref 36.2–44.3)
PTT Lupus Anticoagulant: 35.7 secs (ref 30.0–45.6)

## 2010-11-25 LAB — PROTEIN C ACTIVITY: Protein C Activity: 149 % — ABNORMAL HIGH (ref 75–133)

## 2010-11-25 LAB — BASIC METABOLIC PANEL
CO2: 24 mEq/L (ref 19–32)
Chloride: 106 mEq/L (ref 96–112)
Glucose, Bld: 126 mg/dL — ABNORMAL HIGH (ref 70–99)
Potassium: 4.2 mEq/L (ref 3.5–5.1)
Sodium: 136 mEq/L (ref 135–145)

## 2010-11-25 LAB — ANTITHROMBIN III: AntiThromb III Func: 111 % (ref 76–126)

## 2010-11-25 LAB — BETA-2-GLYCOPROTEIN I ABS, IGG/M/A
Beta-2 Glyco I IgG: 0 G Units (ref <20)
Beta-2-Glycoprotein I IgM: 21 M Units — ABNORMAL HIGH (ref <20)

## 2010-11-25 LAB — CARDIOLIPIN ANTIBODIES, IGG, IGM, IGA
Anticardiolipin IgA: 5 APL U/mL — ABNORMAL LOW (ref <22)
Anticardiolipin IgM: 7 MPL U/mL — ABNORMAL LOW (ref <11)

## 2010-11-26 ENCOUNTER — Inpatient Hospital Stay (HOSPITAL_COMMUNITY): Payer: Medicare Other

## 2010-11-26 LAB — PROTIME-INR
INR: 1.45 (ref 0.00–1.49)
Prothrombin Time: 17.9 seconds — ABNORMAL HIGH (ref 11.6–15.2)

## 2010-11-26 LAB — OCCULT BLOOD X 1 CARD TO LAB, STOOL: Fecal Occult Bld: NEGATIVE

## 2010-11-26 LAB — BASIC METABOLIC PANEL
CO2: 23 mEq/L (ref 19–32)
Chloride: 105 mEq/L (ref 96–112)
GFR calc Af Amer: >60 mL/min (ref >60)
Potassium: 4.3 mEq/L (ref 3.5–5.1)

## 2010-11-26 LAB — CBC
HCT: 36.7 % — ABNORMAL LOW (ref 39.0–52.0)
Hemoglobin: 11.9 g/dL — ABNORMAL LOW (ref 13.0–17.0)
MCH: 26.6 pg (ref 26.0–34.0)
RBC: 4.47 MIL/uL (ref 4.22–5.81)

## 2010-11-26 LAB — PROTEIN C, TOTAL: Protein C, Total: 105 % (ref 72–160)

## 2010-11-26 LAB — PROTEIN S, TOTAL: Protein S Ag, Total: 210 % — ABNORMAL HIGH (ref 60–150)

## 2010-11-26 LAB — FACTOR 5 LEIDEN

## 2011-01-01 NOTE — Discharge Summary (Signed)
[name], RECORD                ACCOUNT NO.:  [id number]  MEDICAL RECORD NO.:  [id number]  LOCATION:  1423                         FACILITY:  [facility]  PHYSICIAN:  Deirdre Peer. Polite, M.D. DATE OF BIRTH:  [birth year]  DATE OF ADMISSION:  11/23/2010 DATE OF DISCHARGE:  11/26/2010                              DISCHARGE SUMMARY   DISCHARGE DIAGNOSES: 1. High probability VQ scan for PE, discharged on Lovenox and     Coumadin, INR at discharge 1.5, for followup INR and outpatient     management, November 28, 2010.  Please note, no extremity Dopplers were     negative for DVT.  We were unable to obtain CT of the chest because     of renal insufficiency. 2. Renal insufficiency.  Admission creatinine 1.8, discharge     creatinine 1.2.  Renal function felt to be elevated secondary to     NSAIDs and diuretics.  Diuretics held at the time of discharge,     will be re-evaluated on outpatient basis to determine the need of     these.  Last echo showed diastolic dysfunction with preserved EF.     BNP within normal limits at discharge as well as chest x-ray within     normal limits. 3. Asthma. 4. Known peripheral vascular disease, status post angioplasty and     stent in the right external iliac artery. 5. Hypertension. 6. Right shoulder pain, thought to be related to calcific tendonitis.  DISCHARGE MEDICATIONS: 1. Peri-Colace p.r.n. 2. Lovenox 90 mg subcutaneously q.24 h. 3. Senna as needed. 4. Foltx daily. 5. Coumadin 5 mg daily. 6. Diltiazem 108 mg daily. 7. Metoprolol 100 mg b.i.d. 8. Plavix 75 mg daily. 9. ProAir inhaler q.4 h p.r.n. 10.Protonix 40 mg daily. 11.Simvastatin 20 mg daily. 12.The patient asked to hold his aspirin, potassium, Lasix, and avoid     Aleve.  DISPOSITION:  The patient discharged to home in stable condition, [facility] will help with administering of Lovenox.  The patient asked to make followup appointment with primary MD in 2 days.  STUDIES:  Chest  x-ray on the 25th, stable examination without acute cardiopulmonary disease.  Renal ultrasound, no hydronephrosis or acute findings.  VQ scan, high probability for PE.  It revealed bilateral segmental defects.  X-ray of the shoulder, mild soft tissue calcification, need of rotator cuff insertion, suspicious for calcific tendonitis.  LABORATORY DATA:  Hypercoagulable panel, antithrombin III 111, protein C 105, protein S function 122, no lupus anticoagulant identified. Homocysteine level elevated at 19.9.  HISTORY OF PRESENT ILLNESS:  This 77 year old male who presented to the hospital with complaint of shoulder pain.  In the ED, patient was evaluated.  Had labs due to elevated D-dimer.  Imaging was obtained, i.e. VQ scan which showed high probability for PE.  Of note, patient did not have any symptoms of shortness of breath, orthopnea, or chest pain but he did have right shoulder pain.  The patient was admitted to the medicine floor bed for further evaluation and treatment.  Please see dictated H & P for further details.  PAST MEDICAL HISTORY:  As per admission H & P.  MEDICATIONS:  As  per admission H & P.  SOCIAL HISTORY:  As per admission H & P.  PAST SURGICAL HISTORY:  As per admission H & P.  ALLERGIES:  As per admission H & P.  FAMILY HISTORY .:  As per admission H & P.  HOSPITAL COURSE:  The patient was admitted to the medicine floor bed for evaluation and treatment of high probability VQ scan, consistent with pulmonary embolism.  He was started on Lovenox and Coumadin.  Venous Doppler ultrasound was negative for DVT.  Hypercoagulable evaluation showed elevated homocysteine.  The patient was hemodynamically stable. There were no complications during this hospitalization.  He did have a complaint of right shoulder pain, consistent with calcific tendonitis. The patient was given gentle fluids, Lasix; and NSAIDs were held.  His renal function improved, back to baseline.   There were no signs of CHF during this hospitalization.  He did have some mild wheezing on exam, which rapidly improved with breathing treatment.  Of note, patient states his asthma typically flares during this time a year.  The patient was discharged to home with [facility] to assist with Lovenox injections.  Will have prompt followup on an outpatient basis for further monitoring of PT/INR.  Once again we were unable to obtain CT due to elevation in his creatinine.  Will have further discussion about his studies on an outpatient basis.     Deirdre Peer. Polite, M.D.     RDP/MEDQ  D:  11/27/2010  T:  11/27/2010  Job:  [id number]  Electronically Signed by Windy Fast POLITE M.D. on 01/01/2011 08:27:05 AM

## 2011-06-05 DIAGNOSIS — J189 Pneumonia, unspecified organism: Secondary | ICD-10-CM

## 2011-06-05 DIAGNOSIS — I639 Cerebral infarction, unspecified: Secondary | ICD-10-CM

## 2011-06-05 HISTORY — DX: Pneumonia, unspecified organism: J18.9

## 2011-06-05 HISTORY — DX: Cerebral infarction, unspecified: I63.9

## 2011-06-24 ENCOUNTER — Emergency Department (HOSPITAL_COMMUNITY): Payer: Medicare HMO

## 2011-06-24 ENCOUNTER — Inpatient Hospital Stay (HOSPITAL_COMMUNITY)
Admission: EM | Admit: 2011-06-24 | Discharge: 2011-08-04 | DRG: 480 | Disposition: A | Discharge status: Skilled Nursing Facility | Payer: Medicare HMO | Attending: Internal Medicine | Admitting: Internal Medicine

## 2011-06-24 ENCOUNTER — Encounter (HOSPITAL_COMMUNITY): Payer: Self-pay | Admitting: *Deleted

## 2011-06-24 ENCOUNTER — Other Ambulatory Visit: Payer: Self-pay

## 2011-06-24 DIAGNOSIS — S72143A Displaced intertrochanteric fracture of unspecified femur, initial encounter for closed fracture: Principal | ICD-10-CM | POA: Diagnosis present

## 2011-06-24 DIAGNOSIS — E46 Unspecified protein-calorie malnutrition: Secondary | ICD-10-CM | POA: Diagnosis present

## 2011-06-24 DIAGNOSIS — K929 Disease of digestive system, unspecified: Secondary | ICD-10-CM | POA: Diagnosis not present

## 2011-06-24 DIAGNOSIS — R0902 Hypoxemia: Secondary | ICD-10-CM | POA: Diagnosis not present

## 2011-06-24 DIAGNOSIS — W19XXXA Unspecified fall, initial encounter: Secondary | ICD-10-CM | POA: Diagnosis present

## 2011-06-24 DIAGNOSIS — N390 Urinary tract infection, site not specified: Secondary | ICD-10-CM | POA: Diagnosis not present

## 2011-06-24 DIAGNOSIS — K565 Intestinal adhesions [bands], unspecified as to partial versus complete obstruction: Secondary | ICD-10-CM | POA: Diagnosis present

## 2011-06-24 DIAGNOSIS — E876 Hypokalemia: Secondary | ICD-10-CM | POA: Diagnosis not present

## 2011-06-24 DIAGNOSIS — Z5331 Laparoscopic surgical procedure converted to open procedure: Secondary | ICD-10-CM

## 2011-06-24 DIAGNOSIS — W010XXA Fall on same level from slipping, tripping and stumbling without subsequent striking against object, initial encounter: Secondary | ICD-10-CM | POA: Diagnosis present

## 2011-06-24 DIAGNOSIS — I2782 Chronic pulmonary embolism: Secondary | ICD-10-CM | POA: Diagnosis present

## 2011-06-24 DIAGNOSIS — K5909 Other constipation: Secondary | ICD-10-CM | POA: Diagnosis present

## 2011-06-24 DIAGNOSIS — R5381 Other malaise: Secondary | ICD-10-CM | POA: Diagnosis present

## 2011-06-24 DIAGNOSIS — E871 Hypo-osmolality and hyponatremia: Secondary | ICD-10-CM | POA: Diagnosis not present

## 2011-06-24 DIAGNOSIS — K5641 Fecal impaction: Secondary | ICD-10-CM | POA: Diagnosis present

## 2011-06-24 DIAGNOSIS — G8190 Hemiplegia, unspecified affecting unspecified side: Secondary | ICD-10-CM | POA: Diagnosis not present

## 2011-06-24 DIAGNOSIS — K625 Hemorrhage of anus and rectum: Secondary | ICD-10-CM | POA: Diagnosis not present

## 2011-06-24 DIAGNOSIS — B9689 Other specified bacterial agents as the cause of diseases classified elsewhere: Secondary | ICD-10-CM | POA: Diagnosis not present

## 2011-06-24 DIAGNOSIS — Z7901 Long term (current) use of anticoagulants: Secondary | ICD-10-CM

## 2011-06-24 DIAGNOSIS — I129 Hypertensive chronic kidney disease with stage 1 through stage 4 chronic kidney disease, or unspecified chronic kidney disease: Secondary | ICD-10-CM | POA: Diagnosis present

## 2011-06-24 DIAGNOSIS — K560 Paralytic ileus: Secondary | ICD-10-CM | POA: Diagnosis not present

## 2011-06-24 DIAGNOSIS — I2699 Other pulmonary embolism without acute cor pulmonale: Secondary | ICD-10-CM | POA: Diagnosis present

## 2011-06-24 DIAGNOSIS — I739 Peripheral vascular disease, unspecified: Secondary | ICD-10-CM | POA: Insufficient documentation

## 2011-06-24 DIAGNOSIS — I4891 Unspecified atrial fibrillation: Secondary | ICD-10-CM | POA: Diagnosis present

## 2011-06-24 DIAGNOSIS — I6350 Cerebral infarction due to unspecified occlusion or stenosis of unspecified cerebral artery: Secondary | ICD-10-CM | POA: Diagnosis not present

## 2011-06-24 DIAGNOSIS — J9600 Acute respiratory failure, unspecified whether with hypoxia or hypercapnia: Secondary | ICD-10-CM | POA: Diagnosis not present

## 2011-06-24 DIAGNOSIS — K56609 Unspecified intestinal obstruction, unspecified as to partial versus complete obstruction: Secondary | ICD-10-CM

## 2011-06-24 DIAGNOSIS — Y9210 Unspecified residential institution as the place of occurrence of the external cause: Secondary | ICD-10-CM | POA: Diagnosis present

## 2011-06-24 DIAGNOSIS — J9601 Acute respiratory failure with hypoxia: Secondary | ICD-10-CM

## 2011-06-24 DIAGNOSIS — S72141A Displaced intertrochanteric fracture of right femur, initial encounter for closed fracture: Secondary | ICD-10-CM

## 2011-06-24 DIAGNOSIS — A0472 Enterocolitis due to Clostridium difficile, not specified as recurrent: Secondary | ICD-10-CM | POA: Diagnosis not present

## 2011-06-24 DIAGNOSIS — N182 Chronic kidney disease, stage 2 (mild): Secondary | ICD-10-CM | POA: Diagnosis present

## 2011-06-24 DIAGNOSIS — Y838 Other surgical procedures as the cause of abnormal reaction of the patient, or of later complication, without mention of misadventure at the time of the procedure: Secondary | ICD-10-CM | POA: Diagnosis not present

## 2011-06-24 DIAGNOSIS — R000 Tachycardia, unspecified: Secondary | ICD-10-CM

## 2011-06-24 DIAGNOSIS — S72001A Fracture of unspecified part of neck of right femur, initial encounter for closed fracture: Secondary | ICD-10-CM | POA: Diagnosis present

## 2011-06-24 DIAGNOSIS — D62 Acute posthemorrhagic anemia: Secondary | ICD-10-CM | POA: Diagnosis not present

## 2011-06-24 HISTORY — PX: JOINT REPLACEMENT: SHX530

## 2011-06-24 HISTORY — DX: Peripheral vascular disease, unspecified: I73.9

## 2011-06-24 HISTORY — DX: Other pulmonary embolism without acute cor pulmonale: I26.99

## 2011-06-24 HISTORY — PX: FRACTURE SURGERY: SHX138

## 2011-06-24 HISTORY — DX: Bursitis of right shoulder: M75.51

## 2011-06-24 HISTORY — DX: Pure hypercholesterolemia, unspecified: E78.00

## 2011-06-24 HISTORY — DX: Acute embolism and thrombosis of unspecified deep veins of unspecified lower extremity: I82.409

## 2011-06-24 HISTORY — DX: Essential (primary) hypertension: I10

## 2011-06-24 LAB — BASIC METABOLIC PANEL
CO2: 23 mEq/L (ref 19–32)
Chloride: 106 mEq/L (ref 96–112)
GFR calc non Af Amer: 63 mL/min — ABNORMAL LOW (ref >90)
Glucose, Bld: 127 mg/dL — ABNORMAL HIGH (ref 70–99)
Potassium: 3.7 mEq/L (ref 3.5–5.1)
Sodium: 139 mEq/L (ref 135–145)

## 2011-06-24 LAB — DIFFERENTIAL
Eosinophils Absolute: 0.4 10*3/uL (ref 0.0–0.7)
Lymphocytes Relative: 39 % (ref 12–46)
Lymphs Abs: 2 10*3/uL (ref 0.7–4.0)
Neutro Abs: 2.3 10*3/uL (ref 1.7–7.7)
Neutrophils Relative %: 44 % (ref 43–77)

## 2011-06-24 LAB — TROPONIN I: Troponin I: <0.3 ng/mL (ref <0.30)

## 2011-06-24 LAB — PROTIME-INR
INR: 3.22 — ABNORMAL HIGH (ref 0.00–1.49)
Prothrombin Time: 33.4 seconds — ABNORMAL HIGH (ref 11.6–15.2)

## 2011-06-24 LAB — CBC
Hemoglobin: 10.3 g/dL — ABNORMAL LOW (ref 13.0–17.0)
Platelets: 197 10*3/uL (ref 150–400)
RBC: 4.1 MIL/uL — ABNORMAL LOW (ref 4.22–5.81)
WBC: 5.2 10*3/uL (ref 4.0–10.5)

## 2011-06-24 MED ORDER — SIMVASTATIN 10 MG PO TABS
10.0000 mg | ORAL_TABLET | Freq: Every day | ORAL | Status: DC
  Administered 2011-06-24 – 2011-07-02 (×9): 10 mg via ORAL
  Filled 2011-06-24 (×11): qty 1

## 2011-06-24 MED ORDER — POLYETHYLENE GLYCOL 3350 17 G PO PACK
17.0000 g | PACK | Freq: Every day | ORAL | Status: DC | PRN
  Administered 2011-07-01: 17 g via ORAL
  Filled 2011-06-24: qty 1

## 2011-06-24 MED ORDER — MORPHINE SULFATE 4 MG/ML IJ SOLN
4.0000 mg | INTRAMUSCULAR | Status: DC | PRN
  Administered 2011-06-24 – 2011-06-27 (×10): 4 mg via INTRAVENOUS
  Filled 2011-06-24 (×10): qty 1

## 2011-06-24 MED ORDER — FENTANYL CITRATE 0.05 MG/ML IJ SOLN
12.5000 ug | INTRAMUSCULAR | Status: AC | PRN
  Administered 2011-06-24 (×2): 12.5 ug via INTRAVENOUS
  Filled 2011-06-24 (×2): qty 2

## 2011-06-24 MED ORDER — MORPHINE SULFATE 2 MG/ML IJ SOLN
2.0000 mg | INTRAMUSCULAR | Status: DC | PRN
  Administered 2011-06-24 (×2): 2 mg via INTRAVENOUS
  Filled 2011-06-24 (×2): qty 1

## 2011-06-24 MED ORDER — SODIUM CHLORIDE 0.9 % IV SOLN
INTRAVENOUS | Status: DC
  Administered 2011-06-24: 09:00:00 via INTRAVENOUS

## 2011-06-24 MED ORDER — BISACODYL 10 MG RE SUPP: 10.0000 mg | Freq: Every day | RECTAL | Status: DC | PRN

## 2011-06-24 MED ORDER — ONDANSETRON HCL 4 MG PO TABS: 4.0000 mg | ORAL_TABLET | Freq: Four times a day (QID) | ORAL | Status: DC | PRN

## 2011-06-24 MED ORDER — MORPHINE SULFATE 2 MG/ML IJ SOLN: 2.0000 mg | INTRAMUSCULAR | Status: DC | PRN

## 2011-06-24 MED ORDER — ONDANSETRON HCL 4 MG/2ML IJ SOLN
4.0000 mg | Freq: Four times a day (QID) | INTRAMUSCULAR | Status: DC | PRN
  Administered 2011-07-04 – 2011-07-05 (×2): 4 mg via INTRAVENOUS
  Filled 2011-06-24 (×2): qty 2

## 2011-06-24 MED ORDER — DOCUSATE SODIUM 100 MG PO CAPS
100.0000 mg | ORAL_CAPSULE | Freq: Two times a day (BID) | ORAL | Status: DC
  Administered 2011-06-24 – 2011-07-02 (×17): 100 mg via ORAL
  Filled 2011-06-24 (×24): qty 1

## 2011-06-24 MED ORDER — MORPHINE SULFATE 2 MG/ML IJ SOLN
2.0000 mg | Freq: Once | INTRAMUSCULAR | Status: AC
  Administered 2011-06-24: 2 mg via INTRAVENOUS
  Filled 2011-06-24: qty 1

## 2011-06-24 MED ORDER — METOPROLOL TARTRATE 100 MG PO TABS
100.0000 mg | ORAL_TABLET | Freq: Two times a day (BID) | ORAL | Status: DC
  Administered 2011-06-24 – 2011-07-02 (×18): 100 mg via ORAL
  Filled 2011-06-24 (×7): qty 1
  Filled 2011-06-24: qty 4
  Filled 2011-06-24 (×15): qty 1

## 2011-06-24 MED ORDER — SODIUM CHLORIDE 0.9 % IV SOLN
INTRAVENOUS | Status: DC
  Administered 2011-06-24: 20 mL/h via INTRAVENOUS
  Administered 2011-06-24: 12:00:00 via INTRAVENOUS

## 2011-06-24 NOTE — ED Notes (Addendum)
Pt brought in from 5E, pt here visiting wife in room 1506. Pt reports pain to right hip. No LOC. Pain 10/10. Worse with movement. Right leg externally rotated and shortened.

## 2011-06-24 NOTE — ED Notes (Signed)
PA [facility]well here to see this pt

## 2011-06-24 NOTE — Consult Note (Signed)
Reason for Consult: Right hip fracture Referring Physician: [name] is an 78 y.o. male.   HPI: Patient was at [facility] visiting his wife when he slipped and fell landing on the right hip.  He had friends and family in the room who helped him up, he was unable to bear weight and was having pain in the right hip and knee.  He was taken to the ER where xrays revealed a right comminuted intertrochanteric fracture with mild distraction and valgus angulation.  He states his wife was admitted for what he thought was gout and his son and daughter are both here visiting as well.  He last ate some oatmeal at 0230.  His PCP is Dr. [name], he had seen Dr. [name] back in July 2012 for shoulder bursitis.  He has a history of PE and PVD is on coumadin and plavix with baseline INR today at 3.2.  Patient denies any pain elsewhere, denies SOB, CP, N/V, denies hitting head or LOC.    Past Medical History  Diagnosis Date  . Asthma   . Hypertension   . Pulmonary embolism   . Peripheral artery disease   . Hypercholesteremia   . Bursitis of right shoulder   . DVT of leg (deep venous thrombosis)     Past Surgical History  Procedure Date  . Hernia repair   . Appendectomy   . Femoral-femoral bypass graft   . Iliac artery stent     History reviewed. No pertinent family history.  Social History:  reports that he has quit smoking. He has never used smokeless tobacco. He reports that he does not drink alcohol or use illicit drugs.  Allergies: No Known Allergies  Medications: Diltiazem, Klor-Con, Metoprolol, Plavix, Coumadin, Simvastatin per pt  Results for orders placed during the hospital encounter of 06/24/11 (from the past 48 hour(s))  TROPONIN I     Status: Normal   Collection Time   06/24/11  8:30 AM      Component Value Range Comment   Troponin I <0.30  <0.30 (ng/mL)   PROTIME-INR     Status: Abnormal   Collection Time   06/24/11  8:30 AM      Component Value Range Comment    Prothrombin Time 33.4 (*) 11.6 - 15.2 (seconds)    INR 3.22 (*) 0.00 - 1.49    CBC     Status: Abnormal   Collection Time   06/24/11  8:30 AM      Component Value Range Comment   WBC 5.2  4.0 - 10.5 (K/uL)    RBC 4.10 (*) 4.22 - 5.81 (MIL/uL)    Hemoglobin 10.3 (*) 13.0 - 17.0 (g/dL)    HCT 78.2 (*) 95.6 - 52.0 (%)    MCV 82.7  78.0 - 100.0 (fL)    MCH 25.1 (*) 26.0 - 34.0 (pg)    MCHC 30.4  30.0 - 36.0 (g/dL)    RDW 21.3 (*) 08.6 - 15.5 (%)    Platelets 197  150 - 400 (K/uL)   DIFFERENTIAL     Status: Abnormal   Collection Time   06/24/11  8:30 AM      Component Value Range Comment   Neutrophils Relative 44  43 - 77 (%)    Neutro Abs 2.3  1.7 - 7.7 (K/uL)    Lymphocytes Relative 39  12 - 46 (%)    Lymphs Abs 2.0  0.7 - 4.0 (K/uL)    Monocytes Relative 10  3 - 12 (%)    Monocytes Absolute 0.5  0.1 - 1.0 (K/uL)    Eosinophils Relative 8 (*) 0 - 5 (%)    Eosinophils Absolute 0.4  0.0 - 0.7 (K/uL)    Basophils Relative 0  0 - 1 (%)    Basophils Absolute 0.0  0.0 - 0.1 (K/uL)   BASIC METABOLIC PANEL     Status: Abnormal   Collection Time   06/24/11  8:30 AM      Component Value Range Comment   Sodium 139  135 - 145 (mEq/L)    Potassium 3.7  3.5 - 5.1 (mEq/L)    Chloride 106  96 - 112 (mEq/L)    CO2 23  19 - 32 (mEq/L)    Glucose, Bld 127 (*) 70 - 99 (mg/dL)    BUN 12  6 - 23 (mg/dL)    Creatinine, Ser 1.61  0.50 - 1.35 (mg/dL)    Calcium 8.6  8.4 - 10.5 (mg/dL)    GFR calc non Af Amer 63 (*) >90 (mL/min)    GFR calc Af Amer 73 (*) >90 (mL/min)   SAMPLE TO BLOOD BANK     Status: Normal   Collection Time   06/24/11  9:05 AM      Component Value Range Comment   Blood Bank Specimen SAMPLE AVAILABLE FOR TESTING      Sample Expiration 06/27/2011       Dg Chest 1 View  06/24/2011  *RADIOLOGY REPORT*  Clinical Data: 78 year old male status post fall.  Hip fracture.  CHEST - 1 VIEW  Comparison: 04/17/2011 and earlier.  Findings: AP portable view 0849 hours.  Stable lung volumes.  Stable right greater than left apical scarring.  Stable cardiac size and mediastinal contours.  No pneumothorax, pulmonary edema, pleural effusion or acute pulmonary opacity.  No acute fracture identified about the thorax.  IMPRESSION: No acute cardiopulmonary abnormality.  Original Report Authenticated By: Harley Hallmark, M.D.   Dg Hip Complete Right  06/24/2011  *RADIOLOGY REPORT*  Clinical Data: 78 year old male with fall and hip pain.  RIGHT HIP - COMPLETE 2+ VIEW  Comparison: None.  Findings: Comminuted intertrochanteric fracture of the right femur. Right femoral head and neck appear to remain intact.  Mild distraction and slight valgus angulation.  The pelvis appears intact.  Postoperative changes to both inguinal regions.  Right iliac stent.  Sequelae of abdominal hernia repair. Proximal left femur grossly intact.  IMPRESSION: Comminuted right femur intertrochanteric fracture with mild distraction and valgus angulation.  Original Report Authenticated By: Harley Hallmark, M.D.    Review of Systems  Constitutional: Negative.  Negative for fever and chills.  HENT: Negative.  Negative for neck pain.   Eyes: Negative.   Respiratory: Negative for cough, hemoptysis, sputum production, shortness of breath and wheezing.   Cardiovascular: Positive for claudication and leg swelling. Negative for chest pain and palpitations.  Gastrointestinal: Positive for heartburn. Negative for nausea, vomiting, abdominal pain and blood in stool.  Genitourinary: Negative for dysuria and flank pain.  Musculoskeletal: Positive for myalgias, joint pain and falls. Negative for back pain.  Skin: Negative.  Negative for itching and rash.  Neurological: Negative for dizziness, tingling, sensory change, loss of consciousness and headaches.  Endo/Heme/Allergies: Bruises/bleeds easily.   Blood pressure 173/92, pulse 98, temperature 98.1 F (36.7 C), temperature source Oral, resp. rate 18, height 5\' 6"  (1.676 m), weight 60.782  kg (134 lb), SpO2 96.00%. Physical Exam  Constitutional: He is oriented to person,  place, and time. He appears well-nourished. No distress.  HENT:  Head: Normocephalic and atraumatic.  Eyes: Pupils are equal, round, and reactive to light.  Neck: Normal range of motion. Neck supple.  Cardiovascular: Normal rate, regular rhythm, normal heart sounds and intact distal pulses.   No murmur heard. Respiratory: Effort normal and breath sounds normal. No respiratory distress. He has no wheezes. He has no rales. He exhibits no tenderness.  GI: Soft. Bowel sounds are normal. He exhibits distension. He exhibits no mass. There is no tenderness. There is no rebound and no guarding.  Musculoskeletal: He exhibits tenderness.       Right hip: He exhibits decreased range of motion, tenderness, bony tenderness and swelling.       Bilat LE, dorsi and plantar flexion intact, SILT, good distal pulses.  RLE slight short and externally rotated, pain with ROM.  C/o knee pain but no TTP.  Secondary exam of UE and LE normal no TTP good ROM.   Lymphadenopathy:    He has no cervical adenopathy.  Neurological: He is alert and oriented to person, place, and time.  Skin: Skin is warm and dry. No rash noted. No erythema.    Assessment/Plan: Right Intertrochanteric Hip Fracture s/p fall today  Patient admitted to [facility] under medicine service.  Plan is for IM nail right hip.  We will need to get INR down prior to any surgical intervention and with patient on plavix and coumadin we are looking most likely to Fri or Sat.  Continue pain control as needed, continue to monitor labs.  Will discuss plan with orthopaedic team.  Margart Sickles 06/24/2011, 1:13 PM

## 2011-06-24 NOTE — ED Notes (Signed)
Patient transported to X-ray 

## 2011-06-24 NOTE — Progress Notes (Signed)
ED CM noted CM consult listed and cancelled in EPIC. Pending patient admission

## 2011-06-24 NOTE — Progress Notes (Signed)
ANTICOAGULATION CONSULT NOTE - Initial Consult  Pharmacy Consult for Heparin Indication: chronic Coumadin for PE/PVD. Admit with hip fracture   No Known Allergies  Patient Measurements: Height: 5\' 6"  (167.6 cm) Weight: 134 lb (60.782 kg) IBW/kg (Calculated) : 63.8   Vital Signs: Temp: 98.1 F (36.7 C) (02/20 0809) Temp src: Oral (02/20 0809) BP: 164/87 mmHg (02/20 1002) Pulse Rate: 88  (02/20 1002)  Labs:  Basename 06/24/11 0830  HGB 10.3*  HCT 33.9*  PLT 197  APTT --  LABPROT 33.4*  INR 3.22*  HEPARINUNFRC --  CREATININE 1.11  CKTOTAL --  CKMB --  TROPONINI <0.30   Estimated Creatinine Clearance: 49.4 ml/min (by C-G formula based on Cr of 1.11).  Medical History: Past Medical History  Diagnosis Date  . Asthma   . Hypertension   . Pulmonary embolism   . Peripheral artery disease   . Hypercholesteremia   . Bursitis of right shoulder   . DVT of leg (deep venous thrombosis)     Medications:  Scheduled:    . docusate sodium  100 mg Oral BID  . metoprolol tartrate  100 mg Oral BID  . simvastatin  10 mg Oral q1800   Infusions:    . sodium chloride    . DISCONTD: sodium chloride 75 mL/hr at 06/24/11 1478    Assessment: 78 yo male visiting his wife in hospital, sudden onset of hip pain. Hip fx per Xray. Chronic Coumadin for PE: home dose 2.5mg  MWFSu, 5mg  other days. Also on Plavix daily/PVD Baseline INR 3.22  Goal of Therapy:  Heparin level 0.3-0.7 units/ml   Plan:  Begin Heparin when INR subtherapeutic. Daily INR ordered. Await ortho consult, plans, probable surgery.  Otho Bellows PharmD 06/24/2011,12:12 PM

## 2011-06-24 NOTE — ED Notes (Signed)
ZOX:[id number]<AV> Expected date:06/24/11<BR> Expected time: 7:56 AM<BR> Means of arrival:Other<BR> Comments:<BR> Hold for [place]

## 2011-06-24 NOTE — ED Provider Notes (Signed)
History     CSN: [id number]  Arrival date & time 06/24/11  0809   First MD Initiated Contact with Patient 06/24/11 0813      Chief Complaint  Patient presents with  . Hip Pain    HPI Pt was seen at 0810.  Per pt, c/o sudden onset and persistence of constant right hip pain that began PTA.  Pt states he tripped/slipped and fell, landing directly onto his right hip.  Denies prodromal symptoms before fall, denies hitting head/no LOC, no tingling/numbness in extremity, no neck or back pain, no CP/SOB, no abd pain.  Last meal approx 0300 this morning PTA.      PMD:  Dr. Marlene Lard:  [facility] Past Medical History  Diagnosis Date  . Asthma   . DVT of leg (deep venous thrombosis)   . Hypertension   . Pulmonary embolism   . Peripheral artery disease   . Hypercholesteremia   . Bursitis of right shoulder     Past Surgical History  Procedure Date  . Hernia repair   . Appendectomy   . Femoral-femoral bypass graft   . Iliac artery stent     History  Substance Use Topics  . Smoking status: Former Games developer  . Smokeless tobacco: Not on file  . Alcohol Use: No    Review of Systems ROS: Statement: All systems negative except as marked or noted in the HPI; Constitutional: Negative for fever and chills. ; ; Eyes: Negative for eye pain, redness and discharge. ; ; ENMT: Negative for ear pain, hoarseness, nasal congestion, sinus pressure and sore throat. ; ; Cardiovascular: Negative for chest pain, palpitations, diaphoresis, dyspnea and peripheral edema. ; ; Respiratory: Negative for cough, wheezing and stridor. ; ; Gastrointestinal: Negative for nausea, vomiting, diarrhea, abdominal pain, blood in stool, hematemesis, jaundice and rectal bleeding. . ; ; Genitourinary: Negative for dysuria, flank pain and hematuria. ; ; Musculoskeletal: Negative for back pain and neck pain. +right hip pain.; ; Skin: Negative for pruritus, rash, abrasions, blisters, bruising and skin lesion.; ; Neuro: Negative for  headache, lightheadedness and neck stiffness. Negative for weakness, altered level of consciousness , altered mental status, extremity weakness, paresthesias, involuntary movement, seizure and syncope.     Allergies  Review of patient's allergies indicates no known allergies.  Home Medications  No current outpatient prescriptions on file.  BP 141/86  Pulse 80  Temp(Src) 98.1 F (36.7 C) (Oral)  Resp 18  Ht 5\' 6"  (1.676 m)  Wt 134 lb (60.782 kg)  BMI 21.63 kg/m2  SpO2 100%  Physical Exam 0815: Physical examination:  Nursing notes reviewed; Vital signs and O2 SAT reviewed;  Constitutional: Well developed, Well nourished, In no acute distress; Head:  Normocephalic, atraumatic; Eyes: EOMI, PERRL, No scleral icterus; ENMT: Mouth and pharynx normal, Mucous membranes dry; Neck: Supple, Full range of motion, No lymphadenopathy; Cardiovascular: Regular rate and rhythm, No murmur, rub, or gallop; Respiratory: Breath sounds clear & equal bilaterally, No rales, rhonchi, wheezes, or rub, Normal respiratory effort/excursion; Chest: Nontender, Movement normal; Abdomen: Soft, Nontender, Nondistended, Normal bowel sounds; Extremities: Pelvis stable.  Pulses normal. +right hip tenderness to palp with RLE externally rotated and shortened, NMS intact right foot, NT right knee/ankle/foot, No pedal edema, No calf edema or asymmetry.; Neuro: AA&Ox3, Major CN grossly intact.  No gross focal motor or sensory deficits in extremities.; Skin: Color normal, Warm, Dry, no rash.    ED Course  Procedures   MDM  MDM Reviewed: nursing note, previous chart and vitals  Reviewed previous: ECG Interpretation: ECG, labs and x-ray    Date: 06/24/2011  Rate: 87  Rhythm: normal sinus rhythm  QRS Axis: normal  Intervals: normal  ST/T Wave abnormalities: normal  Conduction Disutrbances:none  Narrative Interpretation:  LVH, inf leads Q-waves  Old EKG Reviewed: unchanged; no significant changes from previous EKG dated  08/22/2006.  Results for orders placed during the hospital encounter of 06/24/11  TROPONIN I      Component Value Range   Troponin I <0.30  <0.30 (ng/mL)  PROTIME-INR      Component Value Range   Prothrombin Time 33.4 (*) 11.6 - 15.2 (seconds)   INR 3.22 (*) 0.00 - 1.49   CBC      Component Value Range   WBC 5.2  4.0 - 10.5 (K/uL)   RBC 4.10 (*) 4.22 - 5.81 (MIL/uL)   Hemoglobin 10.3 (*) 13.0 - 17.0 (g/dL)   HCT 16.1 (*) 09.6 - 52.0 (%)   MCV 82.7  78.0 - 100.0 (fL)   MCH 25.1 (*) 26.0 - 34.0 (pg)   MCHC 30.4  30.0 - 36.0 (g/dL)   RDW 04.5 (*) 40.9 - 15.5 (%)   Platelets 197  150 - 400 (K/uL)  DIFFERENTIAL      Component Value Range   Neutrophils Relative 44  43 - 77 (%)   Neutro Abs 2.3  1.7 - 7.7 (K/uL)   Lymphocytes Relative 39  12 - 46 (%)   Lymphs Abs 2.0  0.7 - 4.0 (K/uL)   Monocytes Relative 10  3 - 12 (%)   Monocytes Absolute 0.5  0.1 - 1.0 (K/uL)   Eosinophils Relative 8 (*) 0 - 5 (%)   Eosinophils Absolute 0.4  0.0 - 0.7 (K/uL)   Basophils Relative 0  0 - 1 (%)   Basophils Absolute 0.0  0.0 - 0.1 (K/uL)  BASIC METABOLIC PANEL      Component Value Range   Sodium 139  135 - 145 (mEq/L)   Potassium 3.7  3.5 - 5.1 (mEq/L)   Chloride 106  96 - 112 (mEq/L)   CO2 23  19 - 32 (mEq/L)   Glucose, Bld 127 (*) 70 - 99 (mg/dL)   BUN 12  6 - 23 (mg/dL)   Creatinine, Ser 8.11  0.50 - 1.35 (mg/dL)   Calcium 8.6  8.4 - 91.4 (mg/dL)   GFR calc non Af Amer 63 (*) >90 (mL/min)   GFR calc Af Amer 73 (*) >90 (mL/min)  SAMPLE TO BLOOD BANK      Component Value Range   Blood Bank Specimen SAMPLE AVAILABLE FOR TESTING     Sample Expiration 06/27/2011     Dg Chest 1 View 06/24/2011  *RADIOLOGY REPORT*  Clinical Data: 78 year old male status post fall.  Hip fracture.  CHEST - 1 VIEW  Comparison: 04/17/2011 and earlier.  Findings: AP portable view 0849 hours.  Stable lung volumes. Stable right greater than left apical scarring.  Stable cardiac size and mediastinal contours.  No  pneumothorax, pulmonary edema, pleural effusion or acute pulmonary opacity.  No acute fracture identified about the thorax.  IMPRESSION: No acute cardiopulmonary abnormality.  Original Report Authenticated By: Harley Hallmark, M.D.   Dg Hip Complete Right 06/24/2011  *RADIOLOGY REPORT*  Clinical Data: 78 year old male with fall and hip pain.  RIGHT HIP - COMPLETE 2+ VIEW  Comparison: None.  Findings: Comminuted intertrochanteric fracture of the right femur. Right femoral head and neck appear to remain intact.  Mild distraction and slight  valgus angulation.  The pelvis appears intact.  Postoperative changes to both inguinal regions.  Right iliac stent.  Sequelae of abdominal hernia repair. Proximal left femur grossly intact.  IMPRESSION: Comminuted right femur intertrochanteric fracture with mild distraction and valgus angulation.  Original Report Authenticated By: Harley Hallmark, M.D.     1020:  Dx testing d/w pt.  Questions answered.  Verb understanding, agreeable to admit.  T/C to [facility] (MD's are in OR currently), case discussed, including:  HPI, pertinent PM/SHx, VS/PE, dx testing, ED course and treatment: requests to admit to medicine, they will come to ED to eval pt in consult.  1035:  T/C to [facility] Dr. Ashley Royalty, case discussed, including:  HPI, pertinent PM/SHx, VS/PE, dx testing, ED course and treatment.  Agreeable to admit.  Requests to obtain medical bed to team 1.            Laray Anger, DO 06/25/11 1239

## 2011-06-24 NOTE — H&P (Signed)
Hospital Admission Note Date: 06/24/2011  Patient name: [name] Medical record number: [id number] Date of birth: [birth year] Age: 78 y.o. Gender: male PCP: [name] Apo, MD, MD  Attending physician: [name]. [name] Royalty, MD  Chief Complaint: S/P fall and fracture of right hip.  History of Present Illness:[name] is a pleasant gentleman who was here visiting his wife in the hospital when he tripped against the bedside commode and fell sustaining an intertrochanteric Fx of the right femur. Pt denies any dizziness, LOC or near syncope. He states that he had been in good health and is ambulatory at a community level. Pt is presently on coumadin for treatment of PE and we are asked to admit him to manage his active medical problems in preparation for repair of his fracture.  Scheduled Meds:   . docusate sodium  100 mg Oral BID  . metoprolol tartrate  100 mg Oral BID  .  morphine injection  2 mg Intravenous Once  . simvastatin  10 mg Oral q1800   Continuous Infusions:   . sodium chloride 20 mL/hr (06/24/11 1907)  . DISCONTD: sodium chloride Stopped (06/24/11 1214)   PRN Meds:.bisacodyl, fentaNYL, morphine, ondansetron (ZOFRAN) IV, ondansetron, polyethylene glycol, DISCONTD: morphine, DISCONTD:  morphine injection Allergies: Review of patient's allergies indicates no known allergies. Past Medical History  Diagnosis Date  . Asthma   . Hypertension   . Pulmonary embolism   . Peripheral artery disease   . Hypercholesteremia   . Bursitis of right shoulder   . DVT of leg (deep venous thrombosis)    Past Surgical History  Procedure Date  . Hernia repair   . Appendectomy   . Femoral-femoral bypass graft   . Iliac artery stent    History reviewed. No pertinent family history. History   Social History  . Marital Status: Married    Spouse Name: N/A    Number of Children: N/A  . Years of Education: N/A   Occupational History  . Not on file.   Social History Main Topics    . Smoking status: Former Games developer  . Smokeless tobacco: Never Used  . Alcohol Use: No  . Drug Use: No  . Sexually Active: No   Other Topics Concern  . Not on file   Social History Narrative  . No narrative on file   Review of Systems: A comprehensive review of systems was negative. Physical Exam: No intake or output data in the 24 hours ending 06/24/11 2023 General: Alert, awake, oriented x3, in no acute distress.  HEENT: [place]/AT PEERL, EOMI Neck: Trachea midline,  no masses, no thyromegal,y no JVD, no carotid bruit OROPHARYNX:  Moist, No exudate/ erythema/lesions.  Heart: Regular rate and rhythm, without murmurs, rubs, gallops, PMI non-displaced, no heaves or thrills on palpation.  Lungs: Clear to auscultation, no wheezing or rhonchi noted. No increased vocal fremitus resonant to percussion  Abdomen: Soft, nontender, nondistended, positive bowel sounds, no masses no hepatosplenomegaly noted..  Neuro: No focal neurological deficits noted cranial nerves II through XII grossly intact. DTRs 2+ bilaterally upper and lower extremities. Strength 5 out of 5 in bilateral upper and lower extremities. Musculoskeletal: No warm swelling or erythema around joints, no spinal tenderness noted. Psychiatric: Patient alert and oriented x3, good insight and cognition, good recent to remote recall. Lymph node survey: No cervical axillary or inguinal lymphadenopathy noted.  Lab results:  Basename 06/24/11 0830  NA 139  K 3.7  CL 106  CO2 23  GLUCOSE 127*  BUN 12  CREATININE 1.11  CALCIUM 8.6  MG --  PHOS --   No results found for this basename: AST:2,ALT:2,ALKPHOS:2,BILITOT:2,PROT:2,ALBUMIN:2 in the last 72 hours No results found for this basename: LIPASE:2,AMYLASE:2 in the last 72 hours  Basename 06/24/11 0830  WBC 5.2  NEUTROABS 2.3  HGB 10.3*  HCT 33.9*  MCV 82.7  PLT 197    Basename 06/24/11 0830  CKTOTAL --  CKMB --  CKMBINDEX --  TROPONINI <0.30   No components found with  this basename: POCBNP:3 No results found for this basename: DDIMER:2 in the last 72 hours No results found for this basename: HGBA1C:2 in the last 72 hours No results found for this basename: CHOL:2,HDL:2,LDLCALC:2,TRIG:2,CHOLHDL:2,LDLDIRECT:2 in the last 72 hours No results found for this basename: TSH,T4TOTAL,FREET3,T3FREE,THYROIDAB in the last 72 hours No results found for this basename: VITAMINB12:2,FOLATE:2,FERRITIN:2,TIBC:2,IRON:2,RETICCTPCT:2 in the last 72 hours Imaging results:  Dg Chest 1 View  06/24/2011  *RADIOLOGY REPORT*  Clinical Data: 78 year old male status post fall.  Hip fracture.  CHEST - 1 VIEW  Comparison: 04/17/2011 and earlier.  Findings: AP portable view 0849 hours.  Stable lung volumes. Stable right greater than left apical scarring.  Stable cardiac size and mediastinal contours.  No pneumothorax, pulmonary edema, pleural effusion or acute pulmonary opacity.  No acute fracture identified about the thorax.  IMPRESSION: No acute cardiopulmonary abnormality.  Original Report Authenticated By: Harley Hallmark, M.D.   Dg Hip Complete Right  06/24/2011  *RADIOLOGY REPORT*  Clinical Data: 78 year old male with fall and hip pain.  RIGHT HIP - COMPLETE 2+ VIEW  Comparison: None.  Findings: Comminuted intertrochanteric fracture of the right femur. Right femoral head and neck appear to remain intact.  Mild distraction and slight valgus angulation.  The pelvis appears intact.  Postoperative changes to both inguinal regions.  Right iliac stent.  Sequelae of abdominal hernia repair. Proximal left femur grossly intact.  IMPRESSION: Comminuted right femur intertrochanteric fracture with mild distraction and valgus angulation.  Original Report Authenticated By: Harley Hallmark, M.D.   Other results: ZOX:[id number] ST and T waves changes.   Patient Active Hospital Problem List: PE (pulmonary embolism) (06/24/2011)   Assessment: Pt is presently supratheraputic on Coumadin.   Plan: Will hold  Coumadin and consult Pharmacy to start unfractionated heparin when sub theraputic.  Chronic anticoagulation (06/24/2011)   Assessment: See above   Fall from standing (06/24/2011)   Assessment: Will need PT post repair   Fracture of hip, right, closed (06/24/2011)   Assessment: Ortho consulted.   Plan: Pt is on Plavix and Coumadin. He should not receive Vit-K in reversal but rather wait for INR to decrease before surgery. He will also need 3 days of washout of Plavix before safely proceeding with Hip repair. I have discussed this Orthopedic surgery and they will plan surgery for Friday or Saturday.   HTN (06/24/2011)  Assessment: Pt has been on Metoprolol and Cardizem. Will verify doses with Dr. Nehemiah Settle. Meanwhile based on his current HR and BP will start Metoprolol 100 mg BID.  Peripheral Arterial Disease (06/24/2011)  Assessmnet: Pt is S/P fem-fem bypass and stent placement. He has been on Plavix. Will hold Plavix for Surgery.  Care to be assumed by Dr. Carlena Bjornstad Starting on 06/24/2010. I have discussed patient with Dr. Nehemiah Settle.   Carr Shartzer A. 06/24/2011, 8:23 PM

## 2011-06-24 NOTE — ED Notes (Signed)
Pt reports pain 6/10 after first Fentanyl adm. Pt requests additional dose. Given per order. Awaiting return call by Ortho. Daughter took pts belongings with her to pt's wife's hospital room, 1506. May call her there if needed.

## 2011-06-25 LAB — CBC
Hemoglobin: 10.1 g/dL — ABNORMAL LOW (ref 13.0–17.0)
MCH: 26.6 pg (ref 26.0–34.0)
MCHC: 32.3 g/dL (ref 30.0–36.0)
MCV: 82.6 fL (ref 78.0–100.0)

## 2011-06-25 LAB — PROTIME-INR: Prothrombin Time: 33.2 seconds — ABNORMAL HIGH (ref 11.6–15.2)

## 2011-06-25 MED ORDER — FERROUS FUMARATE 325 (106 FE) MG PO TABS
1.0000 | ORAL_TABLET | Freq: Two times a day (BID) | ORAL | Status: DC
  Administered 2011-06-25 – 2011-07-02 (×14): 106 mg via ORAL
  Filled 2011-06-25 (×16): qty 1

## 2011-06-25 NOTE — Progress Notes (Signed)
Subjective: Patient is pleasant, no apparent distress, H&P reviewed. He still has some pain in his right hip. His INR is elevated, Coumadin will be held as well as Plavix, await input from ortho in reference to timing of surgery. If needed Coumadin can be reversed. Apparently patient had a mechanical fall, no preceding symptoms of chest pain shortness of breath etc.  Objective: Vital signs in last 24 hours: Temp:  [98.3 F (36.8 C)-98.8 F (37.1 C)] 98.3 F (36.8 C) (02/21 0605) Pulse Rate:  [81-102] 102  (02/21 0605) Resp:  [16-18] 18  (02/21 0605) BP: (156-176)/(80-92) 159/90 mmHg (02/21 0605) SpO2:  [94 %-96 %] 95 % (02/21 0605) Weight change:  Last BM Date: 06/23/11  Intake/Output from previous day: 02/20 0701 - 02/21 0700 In: 727.3 [P.O.:480; I.V.:247.3] Out: 750 [Urine:750] Intake/Output this shift: Total I/O In: 240 [P.O.:240] Out: -   General appearance: alert, cooperative and no distress Resp: clear to auscultation bilaterally Cardio: regular rate and rhythm, S1, S2 normal, no murmur, click, rub or gallop Extremities: right lower extremity externally rotated, otherwise no clubbing cyanosis or edema  Lab Results:  Results for orders placed during the hospital encounter of 06/24/11 (from the past 24 hour(s))  PROTIME-INR     Status: Abnormal   Collection Time   06/25/11  3:35 AM      Component Value Range   Prothrombin Time 33.2 (*) 11.6 - 15.2 (seconds)   INR 3.19 (*) 0.00 - 1.49   CBC     Status: Abnormal   Collection Time   06/25/11  3:35 AM      Component Value Range   WBC 6.2  4.0 - 10.5 (K/uL)   RBC 3.79 (*) 4.22 - 5.81 (MIL/uL)   Hemoglobin 10.1 (*) 13.0 - 17.0 (g/dL)   HCT 40.9 (*) 81.1 - 52.0 (%)   MCV 82.6  78.0 - 100.0 (fL)   MCH 26.6  26.0 - 34.0 (pg)   MCHC 32.3  30.0 - 36.0 (g/dL)   RDW 91.4 (*) 78.2 - 15.5 (%)   Platelets 173  150 - 400 (K/uL)      Studies/Results: Dg Chest 1 View  06/24/2011  *RADIOLOGY REPORT*  Clinical Data: 78 year old  male status post fall.  Hip fracture.  CHEST - 1 VIEW  Comparison: 04/17/2011 and earlier.  Findings: AP portable view 0849 hours.  Stable lung volumes. Stable right greater than left apical scarring.  Stable cardiac size and mediastinal contours.  No pneumothorax, pulmonary edema, pleural effusion or acute pulmonary opacity.  No acute fracture identified about the thorax.  IMPRESSION: No acute cardiopulmonary abnormality.  Original Report Authenticated By: Harley Hallmark, M.D.   Dg Hip Complete Right  06/24/2011  *RADIOLOGY REPORT*  Clinical Data: 78 year old male with fall and hip pain.  RIGHT HIP - COMPLETE 2+ VIEW  Comparison: None.  Findings: Comminuted intertrochanteric fracture of the right femur. Right femoral head and neck appear to remain intact.  Mild distraction and slight valgus angulation.  The pelvis appears intact.  Postoperative changes to both inguinal regions.  Right iliac stent.  Sequelae of abdominal hernia repair. Proximal left femur grossly intact.  IMPRESSION: Comminuted right femur intertrochanteric fracture with mild distraction and valgus angulation.  Original Report Authenticated By: Harley Hallmark, M.D.    Medications:  Prior to Admission:  Prescriptions prior to admission  Medication Sig Dispense Refill  . clopidogrel (PLAVIX) 75 MG tablet Take 75 mg by mouth daily.      Marland Kitchen diltiazem (DILACOR  XR) 180 MG 24 hr capsule Take 180 mg by mouth daily.      . metoprolol (LOPRESSOR) 100 MG tablet Take 100 mg by mouth 2 (two) times daily.      . pantoprazole (PROTONIX) 40 MG tablet Take 40 mg by mouth daily.      . simvastatin (ZOCOR) 20 MG tablet Take 10 mg by mouth every evening. Pt cuts tab in half for 10 mg dose      . warfarin (COUMADIN) 5 MG tablet Take 2.5-5 mg by mouth See admin instructions. Pt takes 1/2 of 5 mg  Tablet for 2.5 mg dose on Monday,Wednesday,Friday,sunday. On Tuesday,Thursday, Saturday pt takes 5 mg.       Scheduled:   . docusate sodium  100 mg Oral BID    . metoprolol tartrate  100 mg Oral BID  .  morphine injection  2 mg Intravenous Once  . simvastatin  10 mg Oral q1800   Continuous:   . sodium chloride 20 mL/hr (06/24/11 1907)    Assessment/Plan: @HPROL @  Right hip fracture, treatment per orthopedics  On Coumadin for high probable V/Q suggestive of PE . CT scan was unable to be obtained due to renal insufficiency.  Renal insufficiency, current creatinine stable  Hypertension Peripheral vascular disease Asthma Mild anemia , Follow up iron studies, heme check stools check previous colonoscopy report.(Colonoscopy from 2011 show internal and external hemorrhoids, sigmoid diverticulosis, a few diminutive polyps otherwise unremarkable). There's been no report of blood per rectum  LOS: 1 day   Kalasia Crafton D 06/25/2011, 12:41 PM

## 2011-06-26 ENCOUNTER — Encounter (HOSPITAL_COMMUNITY): Payer: Self-pay | Admitting: Anesthesiology

## 2011-06-26 ENCOUNTER — Encounter (HOSPITAL_COMMUNITY)
Admission: EM | Disposition: A | Discharge status: Skilled Nursing Facility | Payer: Self-pay | Source: Home / Self Care | Attending: Internal Medicine

## 2011-06-26 ENCOUNTER — Inpatient Hospital Stay (HOSPITAL_COMMUNITY): Payer: Medicare HMO

## 2011-06-26 ENCOUNTER — Inpatient Hospital Stay (HOSPITAL_COMMUNITY): Payer: Medicare HMO | Admitting: Anesthesiology

## 2011-06-26 HISTORY — PX: COMPRESSION HIP SCREW: SHX1386

## 2011-06-26 LAB — PROTIME-INR
INR: 2.12 — ABNORMAL HIGH (ref 0.00–1.49)
Prothrombin Time: 24.1 seconds — ABNORMAL HIGH (ref 11.6–15.2)
Prothrombin Time: 20.9 seconds — ABNORMAL HIGH (ref 11.6–15.2)

## 2011-06-26 SURGERY — COMPRESSION HIP
Anesthesia: General | Site: Hip | Laterality: Right | Wound class: Clean

## 2011-06-26 MED ORDER — ACETAMINOPHEN 325 MG PO TABS: 650.0000 mg | ORAL_TABLET | Freq: Four times a day (QID) | ORAL | Status: DC | PRN

## 2011-06-26 MED ORDER — ACETAMINOPHEN 10 MG/ML IV SOLN
INTRAVENOUS | Status: DC | PRN
  Administered 2011-06-26: 1000 mg via INTRAVENOUS

## 2011-06-26 MED ORDER — ONDANSETRON HCL 4 MG/2ML IJ SOLN: 4.0000 mg | Freq: Four times a day (QID) | INTRAMUSCULAR | Status: DC | PRN

## 2011-06-26 MED ORDER — PROMETHAZINE HCL 25 MG/ML IJ SOLN: 6.2500 mg | INTRAMUSCULAR | Status: DC | PRN

## 2011-06-26 MED ORDER — LACTATED RINGERS IV SOLN: INTRAVENOUS | Status: DC

## 2011-06-26 MED ORDER — ALUM & MAG HYDROXIDE-SIMETH 200-200-20 MG/5ML PO SUSP
30.0000 mL | ORAL | Status: DC | PRN
  Administered 2011-06-30: 30 mL via ORAL
  Filled 2011-06-26: qty 30

## 2011-06-26 MED ORDER — ENOXAPARIN SODIUM 40 MG/0.4ML ~~LOC~~ SOLN: 40.0000 mg | SUBCUTANEOUS | Status: DC

## 2011-06-26 MED ORDER — DEXAMETHASONE SODIUM PHOSPHATE 10 MG/ML IJ SOLN
INTRAMUSCULAR | Status: DC | PRN
  Administered 2011-06-26: 10 mg via INTRAVENOUS

## 2011-06-26 MED ORDER — CEFAZOLIN SODIUM 1-5 GM-% IV SOLN
1.0000 g | Freq: Four times a day (QID) | INTRAVENOUS | Status: AC
  Administered 2011-06-26 – 2011-06-27 (×3): 1 g via INTRAVENOUS
  Filled 2011-06-26 (×3): qty 50

## 2011-06-26 MED ORDER — PANTOPRAZOLE SODIUM 40 MG PO TBEC
40.0000 mg | DELAYED_RELEASE_TABLET | Freq: Every day | ORAL | Status: DC
  Administered 2011-06-26 – 2011-07-02 (×7): 40 mg via ORAL
  Filled 2011-06-26 (×8): qty 1

## 2011-06-26 MED ORDER — SODIUM CHLORIDE 0.9 % IV SOLN
INTRAVENOUS | Status: DC
  Administered 2011-06-26 – 2011-06-28 (×4): via INTRAVENOUS

## 2011-06-26 MED ORDER — SUCCINYLCHOLINE CHLORIDE 20 MG/ML IJ SOLN
INTRAMUSCULAR | Status: DC | PRN
  Administered 2011-06-26: 100 mg via INTRAVENOUS

## 2011-06-26 MED ORDER — FENTANYL CITRATE 0.05 MG/ML IJ SOLN: 25.0000 ug | INTRAMUSCULAR | Status: DC | PRN

## 2011-06-26 MED ORDER — METOCLOPRAMIDE HCL 5 MG/ML IJ SOLN
5.0000 mg | Freq: Three times a day (TID) | INTRAMUSCULAR | Status: DC | PRN
  Administered 2011-07-05: 5 mg via INTRAVENOUS
  Administered 2011-07-19 – 2011-08-02 (×8): 10 mg via INTRAVENOUS
  Filled 2011-06-26 (×10): qty 2

## 2011-06-26 MED ORDER — METHOCARBAMOL 100 MG/ML IJ SOLN
500.0000 mg | Freq: Four times a day (QID) | INTRAVENOUS | Status: DC | PRN
  Filled 2011-06-26: qty 5

## 2011-06-26 MED ORDER — WARFARIN SODIUM 4 MG PO TABS
4.0000 mg | ORAL_TABLET | Freq: Once | ORAL | Status: AC
  Administered 2011-06-26: 4 mg via ORAL
  Filled 2011-06-26: qty 1

## 2011-06-26 MED ORDER — PHENOL 1.4 % MT LIQD: 1.0000 | OROMUCOSAL | Status: DC | PRN

## 2011-06-26 MED ORDER — PHENYLEPHRINE HCL 10 MG/ML IJ SOLN
INTRAMUSCULAR | Status: DC | PRN
  Administered 2011-06-26 (×5): 80 ug via INTRAVENOUS

## 2011-06-26 MED ORDER — 0.9 % SODIUM CHLORIDE (POUR BTL) OPTIME
TOPICAL | Status: DC | PRN
  Administered 2011-06-26: 1000 mL

## 2011-06-26 MED ORDER — MEPERIDINE HCL 50 MG/ML IJ SOLN: 6.2500 mg | INTRAMUSCULAR | Status: DC | PRN

## 2011-06-26 MED ORDER — MENTHOL 3 MG MT LOZG: 1.0000 | LOZENGE | OROMUCOSAL | Status: DC | PRN

## 2011-06-26 MED ORDER — METHOCARBAMOL 500 MG PO TABS
500.0000 mg | ORAL_TABLET | Freq: Four times a day (QID) | ORAL | Status: DC | PRN
  Administered 2011-06-27 – 2011-07-01 (×3): 500 mg via ORAL
  Filled 2011-06-26 (×3): qty 1

## 2011-06-26 MED ORDER — METOCLOPRAMIDE HCL 10 MG PO TABS: 5.0000 mg | ORAL_TABLET | Freq: Three times a day (TID) | ORAL | Status: DC | PRN

## 2011-06-26 MED ORDER — ZOLPIDEM TARTRATE 5 MG PO TABS: 5.0000 mg | ORAL_TABLET | Freq: Every evening | ORAL | Status: DC | PRN

## 2011-06-26 MED ORDER — METOPROLOL TARTRATE 100 MG PO TABS
100.0000 mg | ORAL_TABLET | Freq: Two times a day (BID) | ORAL | Status: DC
Start: 2011-06-26 — End: 2011-06-26

## 2011-06-26 MED ORDER — FENTANYL CITRATE 0.05 MG/ML IJ SOLN
INTRAMUSCULAR | Status: DC | PRN
  Administered 2011-06-26: 100 ug via INTRAVENOUS
  Administered 2011-06-26 (×4): 50 ug via INTRAVENOUS
  Administered 2011-06-26 (×2): 25 ug via INTRAVENOUS

## 2011-06-26 MED ORDER — ACETAMINOPHEN 650 MG RE SUPP: 650.0000 mg | Freq: Four times a day (QID) | RECTAL | Status: DC | PRN

## 2011-06-26 MED ORDER — EPHEDRINE SULFATE 50 MG/ML IJ SOLN
INTRAMUSCULAR | Status: DC | PRN
  Administered 2011-06-26: 5 mg via INTRAVENOUS

## 2011-06-26 MED ORDER — DOCUSATE SODIUM 100 MG PO CAPS: 100.0000 mg | ORAL_CAPSULE | Freq: Two times a day (BID) | ORAL | Status: DC

## 2011-06-26 MED ORDER — LACTATED RINGERS IV SOLN
INTRAVENOUS | Status: DC | PRN
  Administered 2011-06-26 (×2): via INTRAVENOUS

## 2011-06-26 MED ORDER — CEFAZOLIN SODIUM 1-5 GM-% IV SOLN
INTRAVENOUS | Status: DC | PRN
  Administered 2011-06-26: 1 g via INTRAVENOUS

## 2011-06-26 MED ORDER — PROPOFOL 10 MG/ML IV BOLUS
INTRAVENOUS | Status: DC | PRN
  Administered 2011-06-26: 100 mg via INTRAVENOUS
  Administered 2011-06-26: 50 mg via INTRAVENOUS

## 2011-06-26 MED ORDER — ENOXAPARIN SODIUM 60 MG/0.6ML ~~LOC~~ SOLN
60.0000 mg | Freq: Two times a day (BID) | SUBCUTANEOUS | Status: DC
  Administered 2011-06-27 – 2011-06-28 (×3): 60 mg via SUBCUTANEOUS
  Filled 2011-06-26 (×5): qty 0.6

## 2011-06-26 MED ORDER — ONDANSETRON HCL 4 MG PO TABS: 4.0000 mg | ORAL_TABLET | Freq: Four times a day (QID) | ORAL | Status: DC | PRN

## 2011-06-26 MED ORDER — PHENYLEPHRINE HCL 10 MG/ML IJ SOLN
10.0000 mg | INTRAVENOUS | Status: DC | PRN
  Administered 2011-06-26: 50 ug/min via INTRAVENOUS

## 2011-06-26 MED ORDER — HYDROCODONE-ACETAMINOPHEN 5-325 MG PO TABS
1.0000 | ORAL_TABLET | ORAL | Status: DC | PRN
  Administered 2011-06-27 – 2011-06-28 (×4): 2 via ORAL
  Administered 2011-06-29 (×2): 1 via ORAL
  Administered 2011-06-30 – 2011-07-03 (×11): 2 via ORAL
  Filled 2011-06-26 (×2): qty 2
  Filled 2011-06-26: qty 1
  Filled 2011-06-26 (×3): qty 2
  Filled 2011-06-26: qty 1
  Filled 2011-06-26 (×10): qty 2

## 2011-06-26 MED ORDER — LIDOCAINE HCL (CARDIAC) 20 MG/ML IV SOLN
INTRAVENOUS | Status: DC | PRN
  Administered 2011-06-26: 40 mg via INTRAVENOUS

## 2011-06-26 MED ORDER — DILTIAZEM HCL ER 180 MG PO CP24
180.0000 mg | ORAL_CAPSULE | Freq: Every day | ORAL | Status: DC
  Administered 2011-06-26 – 2011-07-02 (×7): 180 mg via ORAL
  Filled 2011-06-26 (×9): qty 1

## 2011-06-26 MED ORDER — ONDANSETRON HCL 4 MG/2ML IJ SOLN
INTRAMUSCULAR | Status: DC | PRN
  Administered 2011-06-26: 4 mg via INTRAVENOUS

## 2011-06-26 SURGICAL SUPPLY — 51 items
BAG SPEC THK2 15X12 ZIP CLS (MISCELLANEOUS)
BAG ZIPLOCK 12X15 (MISCELLANEOUS) ×1 IMPLANT
BANDAGE ELASTIC 6 VELCRO ST LF (GAUZE/BANDAGES/DRESSINGS) ×2 IMPLANT
BANDAGE GAUZE ELAST BULKY 4 IN (GAUZE/BANDAGES/DRESSINGS) ×2 IMPLANT
BIT DRILL TWIST 3.8X7 (BIT) ×1 IMPLANT
BNDG COHESIVE 4X5 TAN STRL (GAUZE/BANDAGES/DRESSINGS) ×2 IMPLANT
CLOTH BEACON ORANGE TIMEOUT ST (SAFETY) ×2 IMPLANT
COVER SURGICAL LIGHT HANDLE (MISCELLANEOUS) ×1 IMPLANT
DECANTER SPIKE VIAL GLASS SM (MISCELLANEOUS) ×1 IMPLANT
DRAPE STERI IOBAN 125X83 (DRAPES) ×2 IMPLANT
DRSG ADAPTIC 3X8 NADH LF (GAUZE/BANDAGES/DRESSINGS) ×1 IMPLANT
DRSG MEPILEX BORDER 4X8 (GAUZE/BANDAGES/DRESSINGS) ×1 IMPLANT
DRSG PAD ABDOMINAL 8X10 ST (GAUZE/BANDAGES/DRESSINGS) ×1 IMPLANT
ELECT REM PT RETURN 9FT ADLT (ELECTROSURGICAL) ×2
ELECTRODE REM PT RTRN 9FT ADLT (ELECTROSURGICAL) ×1 IMPLANT
EVACUATOR 1/8 PVC DRAIN (DRAIN) ×1 IMPLANT
GLOVE BIO SURGEON STRL SZ7.5 (GLOVE) ×1 IMPLANT
GLOVE BIO SURGEON STRL SZ8.5 (GLOVE) ×2 IMPLANT
GLOVE SURG ORTHO 7.0 STRL STRW (GLOVE) ×1 IMPLANT
GLOVE SURG ORTHO 8.0 STRL STRW (GLOVE) ×2 IMPLANT
GLOVE SURG SS PI 6.5 STRL IVOR (GLOVE) ×4 IMPLANT
GLOVE SURG SS PI 8.0 STRL IVOR (GLOVE) ×5 IMPLANT
GLOVE SURG SS PI 8.5 STRL IVOR (GLOVE) ×1
GLOVE SURG SS PI 8.5 STRL STRW (GLOVE) IMPLANT
GOWN SRG XL XLNG 56XLVL 4 (GOWN DISPOSABLE) IMPLANT
GOWN STRL NON-REIN XL XLG LVL4 (GOWN DISPOSABLE) ×8
KIT BASIN OR (CUSTOM PROCEDURE TRAY) ×2 IMPLANT
MANIFOLD NEPTUNE II (INSTRUMENTS) ×2 IMPLANT
NEEDLE HYPO 22GX1.5 SAFETY (NEEDLE) ×1 IMPLANT
NS IRRIG 1000ML POUR BTL (IV SOLUTION) ×2 IMPLANT
PACK GENERAL/GYN (CUSTOM PROCEDURE TRAY) ×2 IMPLANT
PAD CAST 4YDX4 CTTN HI CHSV (CAST SUPPLIES) ×1 IMPLANT
PADDING CAST COTTON 4X4 STRL (CAST SUPPLIES) ×2
PIN THREADED GUIDE ACE (PIN) ×1 IMPLANT
PLATE COMP 135D 4H STD (Plate) ×1 IMPLANT
POSITIONER SURGICAL ARM (MISCELLANEOUS) ×3 IMPLANT
SCREW ACECAP 38MM (Screw) ×2 IMPLANT
SCREW ACECAP 40MM (Screw) ×1 IMPLANT
SCREW ACECAP 42MM (Screw) ×1 IMPLANT
SCREW COMPRESSION SHORT (Screw) IMPLANT
SCREW LAG 100MM (Screw) ×1 IMPLANT
SPONGE GAUZE 4X4 12PLY (GAUZE/BANDAGES/DRESSINGS) ×2 IMPLANT
STAPLER VISISTAT 35W (STAPLE) ×1 IMPLANT
SUT VIC AB 0 CT1 27 (SUTURE) ×2
SUT VIC AB 0 CT1 27XBRD ANTBC (SUTURE) ×3 IMPLANT
SUT VIC AB 1 CT1 27 (SUTURE) ×4
SUT VIC AB 1 CT1 27XBRD ANTBC (SUTURE) ×2 IMPLANT
SUT VIC AB 2-0 CT1 27 (SUTURE) ×2
SUT VIC AB 2-0 CT1 27XBRD (SUTURE) ×1 IMPLANT
SYR 20CC LL (SYRINGE) ×1 IMPLANT
WATER STERILE IRR 1500ML POUR (IV SOLUTION) ×2 IMPLANT

## 2011-06-26 NOTE — Consult Note (Signed)
History:  [name] was admitted by the hospitalitis for a fall on his right hip.  My partner, Dr. Madelon Lips, was consulted and arranged for me to do surgery after the INR returned to normal.  Physical Exam:  Please see the orthopaedic PA's note from consultation.  Nothing has changed in the interim.  Assessment:  Right Intertrochanteric Femur Fracture  Plan:  Open Reduction Internal Fixation today by me.    Mila Homer. Sherlean Foot, M.D.

## 2011-06-26 NOTE — Transfer of Care (Signed)
Immediate Anesthesia Transfer of Care Note  Patient: [name]  Procedure(s) Performed: Procedure(s) (LRB): COMPRESSION HIP (Right)  Patient Location: PACU  Anesthesia Type: General  Level of Consciousness: awake, alert , oriented, patient cooperative and responds to stimulation  Airway & Oxygen Therapy: Patient Spontanous Breathing and Patient connected to face mask oxygen  Post-op Assessment: Report given to PACU RN, Post -op Vital signs reviewed and stable and Patient moving all extremities  Post vital signs: Reviewed and stable  Complications: No apparent anesthesia complications

## 2011-06-26 NOTE — Anesthesia Postprocedure Evaluation (Signed)
  Anesthesia Post-op Note  Patient: [name]  Procedure(s) Performed: Procedure(s) (LRB): COMPRESSION HIP (Right)  Patient Location: PACU  Anesthesia Type: General  Level of Consciousness: awake and alert   Airway and Oxygen Therapy: Patient Spontanous Breathing  Post-op Pain: mild  Post-op Assessment: Post-op Vital signs reviewed, Patient's Cardiovascular Status Stable, Respiratory Function Stable, Patent Airway and No signs of Nausea or vomiting  Post-op Vital Signs: stable  Complications: No apparent anesthesia complications

## 2011-06-26 NOTE — Progress Notes (Signed)
Subjective: Patient complains of some abdominal discomfort, he has not had a bowel movement, there's no nausea no emesis.  Objective: Vital signs in last 24 hours: Temp:  [98.5 F (36.9 C)-98.6 F (37 C)] 98.5 F (36.9 C) (02/22 0554) Pulse Rate:  [86-120] 104  (02/22 0554) Resp:  [18] 18  (02/22 0554) BP: (142-165)/(84-94) 142/89 mmHg (02/22 0554) SpO2:  [92 %-96 %] 92 % (02/22 0554) Weight change:  Last BM Date: 06/23/11  Intake/Output from previous day: 02/21 0701 - 02/22 0700 In: 1561.7 [P.O.:1080; I.V.:481.7] Out: 950 [Urine:950] Intake/Output this shift:    General appearance: alert and cooperative Resp: clear to auscultation bilaterally Cardio: regular rate and rhythm, S1, S2 normal, no murmur, click, rub or gallop GI: slightly protuberant, tympanic, positive bowel sounds, no mass appreciated Extremities: no clubbing cyanosis or edema, right lower extremity externally rotated  Lab Results:  Results for orders placed during the hospital encounter of 06/24/11 (from the past 24 hour(s))  PROTIME-INR     Status: Abnormal   Collection Time   06/26/11  3:40 AM      Component Value Range   Prothrombin Time 24.1 (*) 11.6 - 15.2 (seconds)   INR 2.12 (*) 0.00 - 1.49       Studies/Results: No results found.  Medications:  Prior to Admission:  Prescriptions prior to admission  Medication Sig Dispense Refill  . clopidogrel (PLAVIX) 75 MG tablet Take 75 mg by mouth daily.      Marland Kitchen diltiazem (DILACOR XR) 180 MG 24 hr capsule Take 180 mg by mouth daily.      . metoprolol (LOPRESSOR) 100 MG tablet Take 100 mg by mouth 2 (two) times daily.      . pantoprazole (PROTONIX) 40 MG tablet Take 40 mg by mouth daily.      . simvastatin (ZOCOR) 20 MG tablet Take 10 mg by mouth every evening. Pt cuts tab in half for 10 mg dose      . warfarin (COUMADIN) 5 MG tablet Take 2.5-5 mg by mouth See admin instructions. Pt takes 1/2 of 5 mg  Tablet for 2.5 mg dose on  Monday,Wednesday,Friday,sunday. On Tuesday,Thursday, Saturday pt takes 5 mg.       Scheduled:   . docusate sodium  100 mg Oral BID  . ferrous fumarate  1 tablet Oral BID  . metoprolol tartrate  100 mg Oral BID  . simvastatin  10 mg Oral q1800   Continuous:   . sodium chloride 20 mL/hr (06/24/11 1907)    Assessment/Plan:  Right hip fracture, treatment per orthopedics   Abdominal discomfort, probable constipation, rule out ileus, check abdominal series  On Coumadin for high probable V/Q suggestive of PE . CT scan was unable to be obtained due to renal insufficiency.   Renal insufficiency, current creatinine stable   Hypertension   Peripheral vascular disease   Asthma   Mild anemia , Follow up iron studies, heme check stools check previous colonoscopy report.(Colonoscopy from 2011 show internal and external hemorrhoids, sigmoid diverticulosis, a few diminutive polyps otherwise unremarkable). There's been no report of blood per rectum    LOS: 2 days   Mikalyn Hermida D 06/26/2011, 10:30 AM

## 2011-06-26 NOTE — Progress Notes (Signed)
ANTICOAGULATION CONSULT NOTE - Follow Up Consult  Pharmacy Consult for Lovenox (full dose)/ Warfarin Indication: pulmonary embolus s/p ORIF 2/22  No Known Allergies  Patient Measurements: Height: 5\' 6"  (167.6 cm) Weight: 134 lb (60.782 kg) IBW/kg (Calculated) : 63.8    Vital Signs: Temp: 97.5 F (36.4 C) (02/22 1720) Temp src: Oral (02/22 1720) BP: 152/83 mmHg (02/22 1720) Pulse Rate: 86  (02/22 1720)  Labs:  Basename 06/26/11 1145 06/26/11 0340 06/25/11 0335 06/24/11 0830  HGB -- -- 10.1* 10.3*  HCT -- -- 31.3* 33.9*  PLT -- -- 173 197  APTT -- -- -- --  LABPROT 20.9* 24.1* 33.2* --  INR 1.77* 2.12* 3.19* --  HEPARINUNFRC -- -- -- --  CREATININE -- -- -- 1.11  CKTOTAL -- -- -- --  CKMB -- -- -- --  TROPONINI -- -- -- <0.30   Estimated Creatinine Clearance: 49.4 ml/min (by C-G formula based on Cr of 1.11).   Medications:  Scheduled:    .  ceFAZolin (ANCEF) IV  1 g Intravenous Q6H  . diltiazem  180 mg Oral Daily  . docusate sodium  100 mg Oral BID  . enoxaparin  40 mg Subcutaneous Q24H  . ferrous fumarate  1 tablet Oral BID  . metoprolol tartrate  100 mg Oral BID  . pantoprazole  40 mg Oral Q1200  . simvastatin  10 mg Oral q1800  . DISCONTD: docusate sodium  100 mg Oral BID  . DISCONTD: metoprolol  100 mg Oral BID    Assessment: Talked to Yetta Barre, M (PA for ortho) regarding anticoagulation (original order of Heparin gtt to start when INR <2 vs Lovenox 40mg  QD that was ordered by ortho PA) for pt s/p ORIF. PA  deferred the decision to Internal Medicine. Discussed with Dr. Debby Bud (on call for Dr. Nehemiah Settle). Per MD, d/c heparin gtt, start full dose Lovenox starting 2/23 at 5am (14 hrs post-op) and resume coumadin as ordered by ortho. Lovenox to be d/ced when INR >=2.  Goal of Therapy:  INR 2-3   Plan:  . Coumadin 4mg  po x 1 tonight . Lovenox 60mg  SQ Q12 starting tomorrow at 5am and d/c when INR>=2 . Check daily PT/INR . F/u CBC while on Lovenox.  Dorethea Clan 06/26/2011,6:03 PM

## 2011-06-26 NOTE — Anesthesia Preprocedure Evaluation (Addendum)
Anesthesia Evaluation  Patient identified by MRN, date of birth, ID band Patient awake    Reviewed: Allergy & Precautions, H&P , NPO status , Patient's Chart, lab work & pertinent test results  Airway Mallampati: II TM Distance: >3 FB Neck ROM: Full    Dental No notable dental hx.    Pulmonary neg pulmonary ROS, asthma ,  clear to auscultation  Pulmonary exam normal       Cardiovascular hypertension, Pt. on medications neg cardio ROS Regular Normal    Neuro/Psych Negative Neurological ROS  Negative Psych ROS   GI/Hepatic negative GI ROS, Neg liver ROS,   Endo/Other  Negative Endocrine ROS  Renal/GU negative Renal ROS  Genitourinary negative   Musculoskeletal negative musculoskeletal ROS (+)   Abdominal   Peds negative pediatric ROS (+)  Hematology negative hematology ROS (+)   Anesthesia Other Findings   Reproductive/Obstetrics negative OB ROS                           Anesthesia Physical Anesthesia Plan  ASA: II  Anesthesia Plan: General   Post-op Pain Management:    Induction: Intravenous  Airway Management Planned:   Additional Equipment:   Intra-op Plan:   Post-operative Plan:   Informed Consent: I have reviewed the patients History and Physical, chart, labs and discussed the procedure including the risks, benefits and alternatives for the proposed anesthesia with the patient or authorized representative who has indicated his/her understanding and acceptance.   Dental advisory given  Plan Discussed with:   Anesthesia Plan Comments:         Anesthesia Quick Evaluation

## 2011-06-26 NOTE — Op Note (Signed)
Dictation number [phone]

## 2011-06-26 NOTE — Preoperative (Signed)
Beta Blockers   Reason not to administer Beta Blockers:Not Applicable, pt had BB this am

## 2011-06-26 NOTE — Consult Note (Signed)
Reason for Consult: Right hip fracture Referring Physician: Asahel Risden is an 78 y.o. male.   HPI: Patient was at [facility] visiting his wife when he slipped and fell landing on the right hip.  He had friends and family in the room who helped him up, he was unable to bear weight and was having pain in the right hip and knee.  He was taken to the ER where xrays revealed a right comminuted intertrochanteric fracture with mild distraction and valgus angulation.  He states his wife was admitted for what he thought was gout and his son and daughter are both here visiting as well.  He last ate some oatmeal at 0230.  His PCP is Dr. Nehemiah Settle, he had seen Dr. Marciano Sequin back in July 2012 for shoulder bursitis.  He has a history of PE and PVD is on coumadin and plavix with baseline INR today at 3.2.  Patient denies any pain elsewhere, denies SOB, CP, N/V, denies hitting head or LOC.    Past Medical History  Diagnosis Date  . Asthma   . Hypertension   . Pulmonary embolism   . Peripheral artery disease   . Hypercholesteremia   . Bursitis of right shoulder   . DVT of leg (deep venous thrombosis)     Past Surgical History  Procedure Date  . Hernia repair   . Appendectomy   . Femoral-femoral bypass graft   . Iliac artery stent     History reviewed. No pertinent family history.  Social History:  reports that he has quit smoking. He has never used smokeless tobacco. He reports that he does not drink alcohol or use illicit drugs.  Allergies: No Known Allergies  Medications: Diltiazem, Klor-Con, Metoprolol, Plavix, Coumadin, Simvastatin per pt  Results for orders placed during the hospital encounter of 06/24/11 (from the past 48 hour(s))  PROTIME-INR     Status: Abnormal   Collection Time   06/25/11  3:35 AM      Component Value Range Comment   Prothrombin Time 33.2 (*) 11.6 - 15.2 (seconds)    INR 3.19 (*) 0.00 - 1.49    CBC     Status: Abnormal   Collection Time   06/25/11   3:35 AM      Component Value Range Comment   WBC 6.2  4.0 - 10.5 (K/uL)    RBC 3.79 (*) 4.22 - 5.81 (MIL/uL)    Hemoglobin 10.1 (*) 13.0 - 17.0 (g/dL)    HCT 78.2 (*) 95.6 - 52.0 (%)    MCV 82.6  78.0 - 100.0 (fL)    MCH 26.6  26.0 - 34.0 (pg)    MCHC 32.3  30.0 - 36.0 (g/dL)    RDW 21.3 (*) 08.6 - 15.5 (%)    Platelets 173  150 - 400 (K/uL)   PROTIME-INR     Status: Abnormal   Collection Time   06/26/11  3:40 AM      Component Value Range Comment   Prothrombin Time 24.1 (*) 11.6 - 15.2 (seconds)    INR 2.12 (*) 0.00 - 1.49    PROTIME-INR     Status: Abnormal   Collection Time   06/26/11 11:45 AM      Component Value Range Comment   Prothrombin Time 20.9 (*) 11.6 - 15.2 (seconds)    INR 1.77 (*) 0.00 - 1.49      Dg Abd Portable 1v  06/26/2011  *RADIOLOGY REPORT*  Clinical Data: Abdominal discomfort, constipation  Comparison:  Abdomen films of 02/02/2009.  Findings: Portable views of the abdomen show some gaseous distention of the colon.  However, there is very little colonic bowel gas.  There is lumbar scoliosis convex to the right.  An acute right intertrochanteric comminuted femoral fracture is noted.  IMPRESSION:  1.  Slight gaseous distention of the colon.  No definite ileus. 2.  Comminuted intertrochanteric right femoral fracture.  Original Report Authenticated By: Juline Patch, M.D.    Review of Systems  Constitutional: Negative.  Negative for fever and chills.  HENT: Negative.  Negative for neck pain.   Eyes: Negative.   Respiratory: Negative for cough, hemoptysis, sputum production, shortness of breath and wheezing.   Cardiovascular: Positive for claudication and leg swelling. Negative for chest pain and palpitations.  Gastrointestinal: Positive for heartburn. Negative for nausea, vomiting, abdominal pain and blood in stool.  Genitourinary: Negative for dysuria and flank pain.  Musculoskeletal: Positive for myalgias, joint pain and falls. Negative for back pain.  Skin:  Negative.  Negative for itching and rash.  Neurological: Negative for dizziness, tingling, sensory change, loss of consciousness and headaches.  Endo/Heme/Allergies: Bruises/bleeds easily.   Blood pressure 159/89, pulse 116, temperature 98.2 F (36.8 C), temperature source Oral, resp. rate 18, height 5\' 6"  (1.676 m), weight 60.782 kg (134 lb), SpO2 95.00%. Physical Exam  Constitutional: He is oriented to person, place, and time. He appears well-nourished. No distress.  HENT:  Head: Normocephalic and atraumatic.  Eyes: Pupils are equal, round, and reactive to light.  Neck: Normal range of motion. Neck supple.  Cardiovascular: Normal rate, regular rhythm, normal heart sounds and intact distal pulses.   No murmur heard. Respiratory: Effort normal and breath sounds normal. No respiratory distress. He has no wheezes. He has no rales. He exhibits no tenderness.  GI: Soft. Bowel sounds are normal. He exhibits distension. He exhibits no mass. There is no tenderness. There is no rebound and no guarding.  Musculoskeletal: He exhibits tenderness.       Right hip: He exhibits decreased range of motion, tenderness, bony tenderness and swelling.       Bilat LE, dorsi and plantar flexion intact, SILT, good distal pulses.  RLE slight short and externally rotated, pain with ROM.  C/o knee pain but no TTP.  Secondary exam of UE and LE normal no TTP good ROM.   Lymphadenopathy:    He has no cervical adenopathy.  Neurological: He is alert and oriented to person, place, and time.  Skin: Skin is warm and dry. No rash noted. No erythema.    Assessment/Plan: Right Intertrochanteric Hip Fracture s/p fall today  Patient admitted to [facility] under medicine service.  Plan is for IM nail right hip.  We will need to get INR down prior to any surgical intervention and with patient on plavix and coumadin we are looking most likely to Fri or Sat.  Continue pain control as needed, continue to monitor labs.  Will discuss plan  with orthopaedic team.  Chayil Gantt,STEPHEN D 06/26/2011, 1:47 PM

## 2011-06-26 NOTE — Progress Notes (Signed)
ANTICOAGULATION CONSULT NOTE - Follow Up Consult  Pharmacy Consult for Heparin when INR < 2.0 Indication: Chronic coumadin for PE/PVD  No Known Allergies  Patient Measurements: Height: 5\' 6"  (167.6 cm) Weight: 134 lb (60.782 kg) IBW/kg (Calculated) : 63.8   Vital Signs: Temp: 98.5 F (36.9 C) (02/22 0554) Temp src: Oral (02/22 0554) BP: 142/89 mmHg (02/22 0554) Pulse Rate: 104  (02/22 0554)  Labs:  Basename 06/26/11 0340 06/25/11 0335 06/24/11 0830  HGB -- 10.1* 10.3*  HCT -- 31.3* 33.9*  PLT -- 173 197  APTT -- -- --  LABPROT 24.1* 33.2* 33.4*  INR 2.12* 3.19* 3.22*  HEPARINUNFRC -- -- --  CREATININE -- -- 1.11  CKTOTAL -- -- --  CKMB -- -- --  TROPONINI -- -- <0.30   Estimated Creatinine Clearance: 49.4 ml/min (by C-G formula based on Cr of 1.11).   Medications:  Scheduled:    . docusate sodium  100 mg Oral BID  . ferrous fumarate  1 tablet Oral BID  . metoprolol tartrate  100 mg Oral BID  . simvastatin  10 mg Oral q1800    Assessment:  78 yo male on chronic coumadin for hx PE  Sustained hip fracture while visiting his wife in the hospital  Plan for possible surgery when INR normalizes and plavix washes out - last dose was 2/19  INR remains therapeutic today 2.12  Goal of Therapy:  Heparin level 0.3-0.7 units/ml   Plan:   No heparin yet since INR remains > 2.0  Follow up plans for surgery - if not today, then  Recheck INR at 6pm tonight & initiate heparin (without a bolus) if < 2.0  Loralee Pacas, PharmD, BCPS Pager: [phone] 06/26/2011,10:48 AM

## 2011-06-26 NOTE — Progress Notes (Signed)
CARE MANAGEMENT NOTE 06/26/2011  Patient:  [name], [name]   Account Number:  [id number]  Date Initiated:  06/26/2011  Documentation initiated by:  Edd Arbour  Subjective/Objective Assessment:   44 m [employer] choice care was visiting his wife at [facility] PCP Dr Nehemiah Settle DX closed right hip fracture INPT Dr Ashley Royalty for Polite attending     Action/Plan:   Anticipated DC Date:  06/29/2011   Anticipated DC Plan:           Choice offered to / List presented to:             Status of service:  In process, will continue to follow Medicare Important Message given?   (If response is "NO", the following Medicare IM given date fields will be blank) Date Medicare IM given:   Date Additional Medicare IM given:    Discharge Disposition:    Per UR Regulation:  Reviewed for med. necessity/level of care/duration of stay  Comments:  06/26/11 1640 Clinton Gallant, RN, CCM Pt presently  in surgery No d/c orders noted in EPIC Pt will continue to be followed

## 2011-06-27 DIAGNOSIS — I10 Essential (primary) hypertension: Secondary | ICD-10-CM

## 2011-06-27 DIAGNOSIS — I2699 Other pulmonary embolism without acute cor pulmonale: Secondary | ICD-10-CM

## 2011-06-27 LAB — PROTIME-INR: Prothrombin Time: 22 seconds — ABNORMAL HIGH (ref 11.6–15.2)

## 2011-06-27 LAB — BASIC METABOLIC PANEL
Calcium: 8.2 mg/dL — ABNORMAL LOW (ref 8.4–10.5)
Creatinine, Ser: 1.07 mg/dL (ref 0.50–1.35)
GFR calc Af Amer: 76 mL/min — ABNORMAL LOW (ref >90)
GFR calc non Af Amer: 66 mL/min — ABNORMAL LOW (ref >90)
Sodium: 134 mEq/L — ABNORMAL LOW (ref 135–145)

## 2011-06-27 LAB — CBC
MCH: 26.2 pg (ref 26.0–34.0)
MCHC: 32 g/dL (ref 30.0–36.0)
MCV: 82 fL (ref 78.0–100.0)
Platelets: 199 10*3/uL (ref 150–400)
RBC: 3.28 MIL/uL — ABNORMAL LOW (ref 4.22–5.81)
RDW: 15.8 % — ABNORMAL HIGH (ref 11.5–15.5)

## 2011-06-27 MED ORDER — BISACODYL 10 MG RE SUPP
10.0000 mg | Freq: Once | RECTAL | Status: AC
  Administered 2011-06-27: 10 mg via RECTAL
  Filled 2011-06-27: qty 1

## 2011-06-27 MED ORDER — WARFARIN SODIUM 5 MG PO TABS
5.0000 mg | ORAL_TABLET | Freq: Once | ORAL | Status: AC
  Administered 2011-06-27: 5 mg via ORAL
  Filled 2011-06-27: qty 1

## 2011-06-27 NOTE — Evaluation (Signed)
Physical Therapy Evaluation Patient Details Name: [name] MRN: [id number] DOB: [birth year] Today's Date: 06/27/2011  Problem List:  Patient Active Problem List  Diagnoses  . PE (pulmonary embolism)  . Chronic anticoagulation  . Fall from standing  . Fracture of hip, right, closed  . Peripheral arterial disease    Past Medical History:  Past Medical History  Diagnosis Date  . Asthma   . Hypertension   . Pulmonary embolism   . Peripheral artery disease   . Hypercholesteremia   . Bursitis of right shoulder   . DVT of leg (deep venous thrombosis)    Past Surgical History:  Past Surgical History  Procedure Date  . Hernia repair   . Appendectomy   . Femoral-femoral bypass graft   . Iliac artery stent     PT Assessment/Plan/Recommendation PT Assessment Clinical Impression Statement: Pt s/p R ORIF after suffering hip fx while visiting wife while here at the hospital.  Pt should progress well, but recommend SNF for further rehab due to performace at eval and split level house. PT Recommendation/Assessment: Patient will need skilled PT in the acute care venue PT Problem List: Decreased strength;Decreased range of motion;Pain;Decreased activity tolerance;Decreased balance;Decreased mobility;Decreased knowledge of use of DME Barriers to Discharge: Decreased caregiver support Barriers to Discharge Comments: pt reports wife is home from hospital now but using a walker PT Therapy Diagnosis : Difficulty walking;Acute pain PT Plan PT Frequency: Min 5X/week PT Treatment/Interventions: DME instruction;Gait training;Functional mobility training;Therapeutic activities;Therapeutic exercise PT Recommendation Follow Up Recommendations: Skilled nursing facility Equipment Recommended: None recommended by PT PT Goals  Acute Rehab PT Goals PT Goal Formulation: With patient Time For Goal Achievement: 7 days Pt will go Supine/Side to Sit: with min assist PT Goal: Supine/Side to Sit -  Progress: Goal set today Pt will go Sit to Stand: with min assist PT Goal: Sit to Stand - Progress: Goal set today Pt will go Stand to Sit: with min assist PT Goal: Stand to Sit - Progress: Goal set today Pt will Ambulate: 16 - 50 feet;with min assist;with rolling walker PT Goal: Ambulate - Progress: Goal set today Pt will Perform Home Exercise Program: with supervision, verbal cues required/provided PT Goal: Perform Home Exercise Program - Progress: Goal set today  PT Evaluation Precautions/Restrictions  Precautions Precautions: Fall Restrictions RLE Weight Bearing: Weight bearing as tolerated Prior Functioning  Home Living Lives With: Spouse Type of Home: House Home Layout: Two level Alternate Level Stairs-Rails: Right Alternate Level Stairs-Number of Steps: 7 Home Access: Level entry Bathroom Shower/Tub: Tub/shower unit Home Adaptive Equipment: None Prior Function Level of Independence: Independent with gait;Independent with homemaking with ambulation;Independent with transfers Vocation: Retired Financial risk analyst Arousal/Alertness: Awake/alert Overall Cognitive Status: Appears within functional limits for tasks assessed Orientation Level: Oriented X4 Sensation/Coordination   Extremity Assessment RLE Assessment RLE Assessment: Within Functional Limits (in supine, pt with R LE internally rotated and flexed) RLE AROM (degrees) Overall AROM Right Lower Extremity: Deficits;Due to pain;Due to decreased strength (limited AAROM) RLE Strength RLE Overall Strength: Deficits;Due to pain (poor quad set) LLE Assessment LLE Assessment: Within Functional Limits Mobility (including Balance) Bed Mobility Bed Mobility: Yes Supine to Sit: 1: +2 Total assist;Patient percentage (comment) Supine to Sit Details (indicate cue type and reason): pt 25%, A to get trunk up and to hold R leg Transfers Transfers: Yes Sit to Stand: 1: +2 Total assist;From elevated surface;With upper  extremity assist Sit to Stand Details (indicate cue type and reason): pt 50% Stand to Sit: 1: +2  Total assist;To chair/3-in-1 Stand to Sit Details: pt 50%, A to extend R LE Ambulation/Gait Ambulation/Gait: Yes Ambulation/Gait Assistance: 1: +2 Total assist;Patient percentage (comment) Ambulation/Gait Assistance Details (indicate cue type and reason): pt 50%, pt tooka few steps forward and then backward.  Pt initially maintaining NWB status then began to Wb some through R LE. Ambulation Distance (Feet): 3 Feet Assistive device: Rolling walker Gait Pattern: Trunk flexed;Antalgic  Balance Balance Assessed: Yes Static Sitting Balance Static Sitting - Balance Support: Bilateral upper extremity supported Static Sitting - Level of Assistance: 5: Stand by assistance Exercise    End of Session PT - End of Session Equipment Utilized During Treatment: Gait belt Activity Tolerance: Patient limited by pain Patient left: in chair;with call bell in reach Nurse Communication: Mobility status for transfers;Mobility status for ambulation (pain level) General Behavior During Session: [facility] for tasks performed Cognition: [facility] for tasks performed  [facility] LUBECK 06/27/2011, 2:02 PM

## 2011-06-27 NOTE — Progress Notes (Signed)
Subjective: Patient admitted with frax right hip after a fall. PTA he was on coumadin for PE last July. Post-op he was taken off heparin drip and started on therapeutic doses of lovenox while resuming coumadin.  He has no compaint. Reports no hip pain unless he moves. No SOB, no c/p  Objective: Lab: Lab Results  Component Value Date   WBC 10.1 06/27/2011   HGB 8.6* 06/27/2011   HCT 26.9* 06/27/2011   MCV 82.0 06/27/2011   PLT 199 06/27/2011   BMET    Component Value Date/Time   NA 134* 06/27/2011 0440   K 4.2 06/27/2011 0440   CL 103 06/27/2011 0440   CO2 24 06/27/2011 0440   GLUCOSE 173* 06/27/2011 0440   BUN 17 06/27/2011 0440   CREATININE 1.07 06/27/2011 0440   CALCIUM 8.2* 06/27/2011 0440   GFRNONAA 66* 06/27/2011 0440   GFRAA 76* 06/27/2011 0440      Imaging: no imaging   Physical Exam: Filed Vitals:   06/27/11 0456  BP: 149/81  Pulse: 84  Temp: 98 F (36.7 C)  Resp: 18   WNWD AA man sitting in bed in no distress Pulm - normal respirations; no increased WOB, no rales or wheezes Cor - 2+ radial pulse, RRR, legs warm MSK - operative site with bandage that is dry    Assessment/Plan: 1. Ortho - POD #1 - doing well. Plan - per ortho re: mobilization 2. GI- no BM x 3 days. Plan - dulcolax supp. If no results MOM 3.Coag - restarted on coumadin. In the interval will be bridged with therapeutic dosing of lovenox - mgd by pharmacy 4. Renal - Cr 1.07 - stable 5. HTN - mild elevation - no change in regimen 6. PVD - distal LE warm. No c/o pain 7. Pulm - no evidence of asthma flare or any respiratory change 8. Anemia - Hgb 10.3 to 8.6 post-op. He is hemodynamically stable no angina Plan - f/u H/H   Illene Regulus 06/27/2011, 7:45 AM

## 2011-06-27 NOTE — Progress Notes (Signed)
ANTICOAGULATION CONSULT NOTE - Follow Up Consult  Pharmacy Consult for Lovenox (full dose)/ Warfarin Indication: pulmonary embolus s/p ORIF 2/22  No Known Allergies  Patient Measurements: Height: 5\' 6"  (167.6 cm) Weight: 134 lb (60.782 kg) IBW/kg (Calculated) : 63.8    Vital Signs: Temp: 99.4 F (37.4 C) (02/23 0955) Temp src: Oral (02/23 0955) BP: 140/80 mmHg (02/23 0955) Pulse Rate: 97  (02/23 0955)  Labs:  Basename 06/27/11 0440 06/26/11 1145 06/26/11 0340 06/25/11 0335  HGB 8.6* -- -- 10.1*  HCT 26.9* -- -- 31.3*  PLT 199 -- -- 173  APTT -- -- -- --  LABPROT 22.0* 20.9* 24.1* --  INR 1.89* 1.77* 2.12* --  HEPARINUNFRC -- -- -- --  CREATININE 1.07 -- -- --  CKTOTAL -- -- -- --  CKMB -- -- -- --  TROPONINI -- -- -- --   Estimated Creatinine Clearance: 51.3 ml/min (by C-G formula based on Cr of 1.07).   Medications:  Scheduled:     . bisacodyl  10 mg Rectal Once  .  ceFAZolin (ANCEF) IV  1 g Intravenous Q6H  . diltiazem  180 mg Oral Daily  . docusate sodium  100 mg Oral BID  . enoxaparin (LOVENOX) injection  60 mg Subcutaneous Q12H  . ferrous fumarate  1 tablet Oral BID  . metoprolol tartrate  100 mg Oral BID  . pantoprazole  40 mg Oral Q1200  . simvastatin  10 mg Oral q1800  . warfarin  4 mg Oral Once  . DISCONTD: docusate sodium  100 mg Oral BID  . DISCONTD: enoxaparin  40 mg Subcutaneous Q24H  . DISCONTD: metoprolol  100 mg Oral BID    Assessment:  77 YOM w/ Hx PE on chronic coumadin PTA  Admitted 2/20 after fall and hip fx while here visiting spouse. INR was supratx 2/20 (3.22)   Underwent ORIF of R hip on 2/22 after INR down (1.77)  Currently on Lovenox full dose bridge (60mg  sq q12h) to tx INR  Home Coumadin dose = 2.5mg  MWF, 5mg  other days. Last dose PTA = 2/19  Coumadin 4mg  last night  INR up, just below tx  Goal of Therapy:  INR 2-3   Plan:   Coumadin 5mg  po x 1 tonight  Continue Lovenox 60mg  SQ Q12 and d/c when INR>=2  Check  daily PT/INR  Gwen Her PharmD  [phone] 06/27/2011 12:27 PM

## 2011-06-27 NOTE — Progress Notes (Signed)
Georgena Spurling, MD   [name], PA-C [address] [place], [place], [place]  [address]                             [phone]   PROGRESS NOTE  Subjective:  negative for Chest Pain  negative for Shortness of Breath  negative for Nausea/Vomiting   negative for Calf Pain  negative for Bowel Movement   Tolerating Diet: yes         Patient reports pain as 5 on 0-10 scale.    Objective: Vital signs in last 24 hours:   Patient Vitals for the past 24 hrs:  BP Temp Temp src Pulse Resp SpO2  06/27/11 0800 - - - - 20  96 %  06/27/11 0456 149/81 mmHg 98 F (36.7 C) Oral 84  18  97 %  06/27/11 0142 119/69 mmHg 98.4 F (36.9 C) Oral 79  18  97 %  06/26/11 2055 121/72 mmHg 98 F (36.7 C) Oral 98  18  99 %  06/26/11 2004 133/79 mmHg 97.3 F (36.3 C) Oral 95  18  99 %  06/26/11 1820 137/82 mmHg 97.4 F (36.3 C) Oral 86  18  99 %  06/26/11 1720 152/83 mmHg 97.5 F (36.4 C) Oral 86  18  99 %  06/26/11 1617 143/85 mmHg 97.2 F (36.2 C) - 91  - 99 %  06/26/11 1600 142/96 mmHg 97.5 F (36.4 C) - 93  16  100 %  06/26/11 1545 163/90 mmHg - - 95  14  100 %  06/26/11 1533 175/92 mmHg 97.8 F (36.6 C) - 98  - 100 %  06/26/11 1223 - - - 116  - 95 %  06/26/11 1208 159/89 mmHg 98.2 F (36.8 C) Oral 128  18  94 %  06/26/11 1206 159/89 mmHg - - 128  - -    @flow {1959:LAST@   Intake/Output from previous day:   02/22 0701 - 02/23 0700 In: 2590 [P.O.:240; I.V.:2250] Out: 900 [Urine:800]   Intake/Output this shift:   02/23 0701 - 02/23 1900 In: -  Out: 175 [Urine:175]   Intake/Output      02/22 0701 - 02/23 0700 02/23 0701 - 02/24 0700   P.O. 240    I.V. (mL/kg) 2250 (37)    IV Piggyback 100    Total Intake(mL/kg) 2590 (42.6)    Urine (mL/kg/hr) 800 (0.5) 175   Blood 100    Total Output 900 175   Net +1690 -175           LABORATORY DATA:  Basename 06/27/11 0440 06/25/11 0335 06/24/11 0830  WBC 10.1 6.2 5.2  HGB 8.6* 10.1* 10.3*  HCT 26.9* 31.3* 33.9*  PLT 199 173 197     Basename 06/27/11 0440 06/24/11 0830  NA 134* 139  K 4.2 3.7  CL 103 106  CO2 24 23  BUN 17 12  CREATININE 1.07 1.11  GLUCOSE 173* 127*  CALCIUM 8.2* 8.6   Lab Results  Component Value Date   INR 1.89* 06/27/2011   INR 1.77* 06/26/2011   INR 2.12* 06/26/2011    Examination:  General appearance: alert, cooperative and no distress Extremities: Homans sign is negative, no sign of DVT  Wound Exam: clean, dry, intact   Drainage:  Scant/small amount Serosanguinous exudate  Motor Exam: EHL and FHL Intact  Sensory Exam: Deep Peroneal normal  Vascular Exam:    Assessment:  1 Day Post-Op  Procedure(s) (LRB): COMPRESSION HIP (Right)  ADDITIONAL DIAGNOSIS:  Active Problems:  PE (pulmonary embolism)  Chronic anticoagulation  Fall from standing  Fracture of hip, right, closed  Acute Blood Loss Anemia   Plan: Physical Therapy as ordered Weight Bearing as Tolerated (WBAT)  DVT Prophylaxis:  Coumadin  DISCHARGE PLAN: Home vs snf, internal medicine's call because of PE  DISCHARGE NEEDS: HHPT, CPM, Walker and 3-in-1 comode seat         [name] 06/27/2011, 8:39 AM

## 2011-06-27 NOTE — Op Note (Signed)
[name], [name] NO.:  [id number]  MEDICAL RECORD NO.:  [id number]  LOCATION:  1522                         FACILITY:  [facility]  PHYSICIAN:  Mila Homer. Sherlean Foot, M.D. DATE OF BIRTH:  07-31-1933  DATE OF PROCEDURE: DATE OF DISCHARGE:                              OPERATIVE REPORT   PREOPERATIVE DIAGNOSIS:  Right intertrochanteric hip fracture.  POSTOPERATIVE DIAGNOSIS:  Right intertrochanteric hip fracture.  PROCEDURE:  Open reduction and internal fixation.  INDICATION FOR PROCEDURE:  The patient is 78 year old black male, status post admission by the hospital service due to fall and fractured right hip.  After medical clearance, informed consent was obtained, taken to the operating room.  DESCRIPTION OF PROCEDURE:  The patient was laid supine, administered general anesthesia and placed on the fracture table, right leg and distraction and internal rotation, left leg in a well-padded lithotomy position.  C-arm imaging used to reduce the fracture before sterile prep and drape.  After sterile prep and drape, a lateral incision was made just inferior to the tip of the greater trochanter approximately 4 cm in length with a #10 blade.  Cautery dissection was then continued into the fascia lata.  I then placed a Bennett retractor holding the vastus lateralis anteriorly and cleaned off the lateral aspect of the femur.  I then used the 135-degree angle guide under AP and lateral C-arm imaging to place the screw in the posterior-inferior aspect of the humeral head through the femoral neck.  Verified that on C-arm imaging and then measured to a 110, chose a 105 screw and 100 screw.  I drilled 100 mm with the reamer and then placed the 100 mm screw in place.  I then put the 135-degree angle 4-hole plate and attempts to distal aspect of the femur.  I then passed two 30 mm bicortical screws.  The old plate in place.  I then took the tension off the leg and with reduction  screw and then removed that.  This afforded anatomic reduction of the intertrochanteric portion of the fracture.  The lesser trochanter was still free.  I then lavaged, closed the fascia lata with running #1 Vicryl, buried interrupted 0 Vicryl, subcuticular 2-0 Vicryl, skin staples and a Mepilex dressing.  COMPLICATIONS:  None.  DRAINS:  None.  EBL:  300 mL.          ______________________________ Mila Homer. Sherlean Foot, M.D.     SDL/MEDQ  D:  06/26/2011  T:  06/27/2011  Job:  [id number]

## 2011-06-28 LAB — HEMOGLOBIN AND HEMATOCRIT, BLOOD
HCT: 22.1 % — ABNORMAL LOW (ref 39.0–52.0)
HCT: 32.1 % — ABNORMAL LOW (ref 39.0–52.0)
Hemoglobin: 7.3 g/dL — ABNORMAL LOW (ref 13.0–17.0)
Hemoglobin: 10.6 g/dL — ABNORMAL LOW (ref 13.0–17.0)

## 2011-06-28 LAB — BASIC METABOLIC PANEL
BUN: 20 mg/dL (ref 6–23)
Calcium: 7.8 mg/dL — ABNORMAL LOW (ref 8.4–10.5)
Creatinine, Ser: 1.15 mg/dL (ref 0.50–1.35)
GFR calc Af Amer: 70 mL/min — ABNORMAL LOW (ref >90)

## 2011-06-28 LAB — ABO/RH: ABO/RH(D): A POS

## 2011-06-28 LAB — PROTIME-INR
INR: 2.69 — ABNORMAL HIGH (ref 0.00–1.49)
Prothrombin Time: 29 seconds — ABNORMAL HIGH (ref 11.6–15.2)

## 2011-06-28 MED ORDER — FUROSEMIDE 10 MG/ML IJ SOLN
20.0000 mg | Freq: Once | INTRAMUSCULAR | Status: AC
  Administered 2011-06-28: 20 mg via INTRAVENOUS
  Filled 2011-06-28: qty 2

## 2011-06-28 MED ORDER — WARFARIN SODIUM 2 MG PO TABS
2.0000 mg | ORAL_TABLET | Freq: Once | ORAL | Status: AC
  Administered 2011-06-28: 2 mg via ORAL
  Filled 2011-06-28: qty 1

## 2011-06-28 NOTE — Progress Notes (Signed)
Physical Therapy Treatment Patient Details Name: Jean Skow MRN: [id number] DOB: [birth year] Today's Date: 06/28/2011  PT Assessment/Plan  PT - Assessment/Plan Comments on Treatment Session: The patient is progressing with mobility requiring less therapist assist today.  He self reported that it seemed easier than yesterday.  I did not push ambulation further secondary to Hgb level are so low and he has only recieved 1/2 a unit of blood thus far.   PT Frequency: Min 5X/week Follow Up Recommendations: Skilled nursing facility Equipment Recommended: Defer to next venue PT Goals  Acute Rehab PT Goals PT Goal: Supine/Side to Sit - Progress: Progressing toward goal PT Goal: Sit to Stand - Progress: Progressing toward goal PT Goal: Stand to Sit - Progress: Progressing toward goal PT Goal: Ambulate - Progress: Progressing toward goal PT Goal: Perform Home Exercise Program - Progress: Progressing toward goal  PT Treatment Precautions/Restrictions  Precautions Precautions: Fall Restrictions Weight Bearing Restrictions: Yes RLE Weight Bearing: Weight bearing as tolerated Mobility (including Balance) Bed Mobility Supine to Sit: 1: +2 Total assist;HOB elevated (Comment degrees);With rails Supine to Sit Details (indicate cue type and reason): Patient 40%, assist of right leg and trunk, verbal cues for 1/2 bridge and sequence including hand placement on railing.   Transfers Sit to Stand: 1: +2 Total assist;From elevated surface;From bed;With upper extremity assist Sit to Stand Details (indicate cue type and reason): patient 70%, assist to steady trunk, anterior weight shift body during transition, verbal cues for upright posture and safe hand placement.   Stand to Sit: 1: +2 Total assist;With upper extremity assist;To elevated surface;To chair/3-in-1;With armrests Stand to Sit Details: Patient 70% with manual assist to reach back for armrests in chair.  Manual assist to help patient lower slowly  and verbal cues for safe technique.   Stand Pivot Transfers: 1: +2 Total assist Stand Pivot Transfer Details (indicate cue type and reason): patient 70% assist to help maneuver RW, verbal cues for leg sequencing, and manual assist to steady patien for balance.   Ambulation/Gait Ambulation/Gait: Yes Ambulation/Gait Assistance: 1: +2 Total assist Ambulation/Gait Assistance Details (indicate cue type and reason): patient 70%, manual assist to steady patient for balance and help patient weight shift trunk while stepping Ambulation Distance (Feet): 3 Feet Assistive device: Rolling walker Gait Pattern: Step-to pattern;Trunk flexed;Antalgic    Exercise  General Exercises - Lower Extremity Ankle Circles/Pumps: AROM;Both;10 reps Short Arc QuadBarbaraann Boys;Right;10 reps Heel Slides: AAROM;Right;10 reps Hip ABduction/ADduction: AAROM;Right;10 reps End of Session PT - End of Session Equipment Utilized During Treatment: Gait belt (RW) Activity Tolerance: Patient limited by pain (also limited by PT secondary to Hgb very low) Patient left: in chair;with call bell in reach (lift pad under bottom) General Behavior During Session: [facility] for tasks performed Cognition: [facility] for tasks performed  Vendela Troung B. Eriel Doyon, PT, DPT [phone] 06/28/2011, 4:18 PM

## 2011-06-28 NOTE — Progress Notes (Signed)
Georgena Spurling, MD   [name], PA-C [address] [place], [place], [place]  [address]                             [phone]   PROGRESS NOTE  Subjective:  negative for Chest Pain  negative for Shortness of Breath  negative for Nausea/Vomiting   negative for Calf Pain  negative for Bowel Movement   Tolerating Diet: yes         Patient reports pain as 5 on 0-10 scale.    Objective: Vital signs in last 24 hours:   Patient Vitals for the past 24 hrs:  BP Temp Temp src Pulse Resp SpO2  06/28/11 0741 - - - - 20  96 %  06/28/11 0458 149/70 mmHg 98.1 F (36.7 C) Oral 85  17  95 %  06/28/11 0306 - - - - 16  -  06/27/11 2339 - - - - 16  -  06/27/11 2218 133/63 mmHg 98.1 F (36.7 C) Oral 87  16  97 %  06/27/11 2000 - - - - 16  95 %  06/27/11 1546 - - - - 20  -  06/27/11 1425 130/72 mmHg 98.3 F (36.8 C) Oral 96  20  93 %  06/27/11 1200 - - - - 20  96 %    @flow {1959:LAST@   Intake/Output from previous day:   02/23 0701 - 02/24 0700 In: 2450 [P.O.:720; I.V.:1730] Out: 1025 [Urine:1025]   Intake/Output this shift:   02/24 0701 - 02/24 1900 In: 120 [P.O.:120] Out: 325 [Urine:325]   Intake/Output      02/23 0701 - 02/24 0700 02/24 0701 - 02/25 0700   P.O. 720 120   I.V. (mL/kg) 1730 (28.5)    IV Piggyback     Total Intake(mL/kg) 2450 (40.3) 120 (2)   Urine (mL/kg/hr) 1025 (0.7) 325   Blood     Total Output 1025 325   Net +1425 -205        Urine Occurrence 1 x    Stool Occurrence 1 x 1 x      LABORATORY DATA:  Basename 06/28/11 0413 06/27/11 0440 06/25/11 0335 06/24/11 0830  WBC -- 10.1 6.2 5.2  HGB 7.3* 8.6* 10.1* 10.3*  HCT 22.1* 26.9* 31.3* 33.9*  PLT -- 199 173 197    Basename 06/28/11 0413 06/27/11 0440 06/24/11 0830  NA 142 134* 139  K 4.0 4.2 3.7  CL 111 103 106  CO2 24 24 23   BUN 20 17 12   CREATININE 1.15 1.07 1.11  GLUCOSE 124* 173* 127*  CALCIUM 7.8* 8.2* 8.6   Lab Results  Component Value Date   INR 2.69* 06/28/2011   INR 1.89*  06/27/2011   INR 1.77* 06/26/2011    Examination:  General appearance: alert, cooperative and no distress Extremities: Homans sign is negative, no sign of DVT  Wound Exam: clean, dry, intact   Drainage:  Scant/small amount Serosanguinous exudate  Motor Exam: EHL and FHL Intact  Sensory Exam: Deep Peroneal normal  Vascular Exam:     Assessment:    2 Days Post-Op  Procedure(s) (LRB): COMPRESSION HIP (Right)  ADDITIONAL DIAGNOSIS:  Active Problems:  PE (pulmonary embolism)  Chronic anticoagulation  Fall from standing  Fracture of hip, right, closed  Acute Blood Loss Anemia   Plan: Physical Therapy as ordered Weight Bearing as Tolerated (WBAT)  DVT Prophylaxis:  Coumadin  DISCHARGE PLAN: Skilled Nursing Facility/Rehab  DISCHARGE NEEDS: HHPT, CPM, Walker and 3-in-1 comode seat         Omar Orrego 06/28/2011, 10:35 AM

## 2011-06-28 NOTE — Progress Notes (Signed)
Subjective: Has had good results with dulcolax - BM x 4. He denies any chest pain, no SOB, no hip pain. He is aware of PT recommendations for rehab.  Objective: Lab:  Hemoglobin & Hematocrit     Component Value Date/Time   HGB 7.3* 06/28/2011 0413   HCT 22.1* 06/28/2011 0413      Imaging:no new imaging  Physical Exam: Filed Vitals:   06/28/11 0741  BP:   Pulse:   Temp:   Resp: 20  WNWD man in no distress Pulm - normal respirations Cor - 2+ radial pulse, RRR Abd- protuberant, soft. Ext - surgical dressing right hip dry.     Assessment/Plan: 1. Ortho - POD #2. PT has seen patient and recommends SNF for rehab. Patient's PE was July '12 and is not a factor in determining best disposition - it is an orthopedic/PT decision.  2. GI - good results   3. Coag - INR 2.9 - therapeutic. Plan - d/c/ lovenox  4. Renal - stable. No new lab  5. HTN  149/70 - ok  6. PVD - stable  7. PUlm - normal respirations w/op wheezing  8. Anemia - HGB 7.3 < 8.6 < 10.3 Plan - type-cross and transfuse 2 units PRBCs   Illene Regulus 06/28/2011, 9:40 AM

## 2011-06-28 NOTE — Progress Notes (Signed)
Plan: patient's dressing was changed today. Suggest SNF FOR SHORT TERM REHAB.  Order placed today for social work consult.

## 2011-06-28 NOTE — Progress Notes (Signed)
ANTICOAGULATION CONSULT NOTE - Follow Up Consult  Pharmacy Consult for Warfarin Indication: pulmonary embolus s/p ORIF 2/22  No Known Allergies  Patient Measurements: Height: 5\' 6"  (167.6 cm) Weight: 134 lb (60.782 kg) IBW/kg (Calculated) : 63.8    Vital Signs: Temp: 98.1 F (36.7 C) (02/24 0458) Temp src: Oral (02/24 0458) BP: 149/70 mmHg (02/24 0458) Pulse Rate: 85  (02/24 0458)  Labs:  Basename 06/28/11 0413 06/27/11 0440 06/26/11 1145  HGB 7.3* 8.6* --  HCT 22.1* 26.9* --  PLT -- 199 --  APTT -- -- --  LABPROT 29.0* 22.0* 20.9*  INR 2.69* 1.89* 1.77*  HEPARINUNFRC -- -- --  CREATININE 1.15 1.07 --  CKTOTAL -- -- --  CKMB -- -- --  TROPONINI -- -- --   Estimated Creatinine Clearance: 47.7 ml/min (by C-G formula based on Cr of 1.15).   Medications:  Scheduled:     . bisacodyl  10 mg Rectal Once  . diltiazem  180 mg Oral Daily  . docusate sodium  100 mg Oral BID  . ferrous fumarate  1 tablet Oral BID  . furosemide  20 mg Intravenous Once  . metoprolol tartrate  100 mg Oral BID  . pantoprazole  40 mg Oral Q1200  . simvastatin  10 mg Oral q1800  . warfarin  5 mg Oral ONCE-1800  . DISCONTD: enoxaparin (LOVENOX) injection  60 mg Subcutaneous Q12H    Assessment:  77 YOM w/ Hx PE on chronic coumadin PTA  Admitted 2/20 after fall and hip fx while here visiting spouse. INR was supratx 2/20 (3.22)   Underwent ORIF of R hip on 2/22 after INR down (1.77)  Currently on Lovenox full dose bridge (60mg  sq q12h) to tx INR  INR up above 2, therapeutic. Larger than expected results given recent held doses and only 2 doses.  Home Coumadin dose = 2.5mg  MWF, 5mg  other days. Last dose PTA = 2/19  Coumadin 4mg  2/22, 5mg  2/23  No bleeding reported, hgb down. Ortho PA-C note indicates only scant amt serosanguinous drainage from wound.  Goal of Therapy:  INR 2-3   Plan:   D/C Lovenox given INR >2  Coumadin 2mg  tonight  Check daily PT/INR  Gwen Her  PharmD  [phone] 06/28/2011 10:48 AM

## 2011-06-29 LAB — BASIC METABOLIC PANEL
CO2: 28 mEq/L (ref 19–32)
Chloride: 105 mEq/L (ref 96–112)
Glucose, Bld: 120 mg/dL — ABNORMAL HIGH (ref 70–99)
Sodium: 140 mEq/L (ref 135–145)

## 2011-06-29 LAB — TYPE AND SCREEN
Antibody Screen: NEGATIVE
Unit division: 0

## 2011-06-29 LAB — PROTIME-INR: INR: 2.34 — ABNORMAL HIGH (ref 0.00–1.49)

## 2011-06-29 MED ORDER — WARFARIN SODIUM 2.5 MG PO TABS
2.5000 mg | ORAL_TABLET | Freq: Once | ORAL | Status: AC
  Administered 2011-06-29: 2.5 mg via ORAL
  Filled 2011-06-29: qty 1

## 2011-06-29 NOTE — Progress Notes (Signed)
CSW met with patient to discuss need for SNF. Patient is alert and oriented x4. Patient is agreeable to CSW faxing patient out and getting bed offers but defers to his daughter to make a decision regarding which facility patient will go to. CSW called patients daughter, left voicemail requesting call back. FL2 completed and patient faxed out. FL2 on chart for doctors signature.  Annalie Wenner C. Vernis Eid MSW, LCSW [phone]

## 2011-06-29 NOTE — Evaluation (Signed)
Occupational Therapy Evaluation Patient Details Name: [name] MRN: [id number] DOB: 08-Sep-1933 Today's Date: 06/29/2011  Problem List:  Patient Active Problem List  Diagnoses  . PE (pulmonary embolism)  . Chronic anticoagulation  . Fall from standing  . Fracture of hip, right, closed  . Peripheral arterial disease    Past Medical History:  Past Medical History  Diagnosis Date  . Asthma   . Hypertension   . Pulmonary embolism   . Peripheral artery disease   . Hypercholesteremia   . Bursitis of right shoulder   . DVT of leg (deep venous thrombosis)    Past Surgical History:  Past Surgical History  Procedure Date  . Hernia repair   . Appendectomy   . Femoral-femoral bypass graft   . Iliac artery stent     OT Assessment/Plan/Recommendation OT Assessment Clinical Impression Statement: Pt is a 78 yo male who presents w L hip ORIF s/p fall. Skilled OT recommended to maximize I w/BADLs in prep for d/c to next venue of care. OT Recommendation/Assessment: Patient will need skilled OT in the acute care venue OT Problem List: Decreased activity tolerance;Decreased safety awareness;Decreased knowledge of use of DME or AE Barriers to Discharge: Inaccessible home environment;Decreased caregiver support OT Therapy Diagnosis : Generalized weakness OT Plan OT Frequency: Min 1X/week OT Treatment/Interventions: Self-care/ADL training;Therapeutic activities;DME and/or AE instruction;Patient/family education OT Recommendation Follow Up Recommendations: Skilled nursing facility Equipment Recommended: Defer to next venue Individuals Consulted Consulted and Agree with Results and Recommendations: Patient OT Goals Acute Rehab OT Goals OT Goal Formulation: With patient ADL Goals Pt Will Perform Grooming: with supervision;Sitting, edge of bed (X 3 tasks to improve activity tolerance.) ADL Goal: Grooming - Progress: Goal set today Pt Will Transfer to Toilet: with mod assist;3-in-1;Stand  pivot transfer ADL Goal: Toilet Transfer - Progress: Goal set today Pt Will Perform Toileting - Clothing Manipulation: with mod assist;Standing ADL Goal: Toileting - Clothing Manipulation - Progress: Goal set today Pt Will Perform Toileting - Hygiene: with mod assist;Sit to stand from 3-in-1/toilet ADL Goal: Toileting - Hygiene - Progress: Goal set today  OT Evaluation Precautions/Restrictions  Precautions Precautions: Fall Restrictions Weight Bearing Restrictions: No RLE Weight Bearing: Weight bearing as tolerated Prior Functioning Home Living Home Adaptive Equipment: None Additional Comments: Pt d/cing to snf Prior Function Level of Independence: Independent with basic ADLs;Independent with gait;Independent with homemaking with ambulation;Independent with transfers Driving: Yes Vocation: Retired ADL ADL Grooming: Simulated;Set up Where Assessed - Grooming: Sitting, chair;Supported Upper Body Bathing: Simulated;Minimal assistance Where Assessed - Upper Body Bathing: Sitting, chair;Supported Lower Body Bathing: Simulated;+2 Total assistance;Comment for patient % (Pt 0%) Where Assessed - Lower Body Bathing: Sit to stand from bed Upper Body Dressing: Simulated;Minimal assistance Where Assessed - Upper Body Dressing: Sitting, chair;Supported Lower Body Dressing: +2 Total assistance;Comment for patient % (Pt 0%) Where Assessed - Lower Body Dressing: Sit to stand from bed Toilet Transfer: Simulated;+2 Total assistance;Comment for patient % (Pt 70% w/max vcs to complete transfer to chair.) Toilet Transfer Method: Stand pivot Toileting - Clothing Manipulation: Simulated;+2 Total assistance;Comment for patient % (Pt 0%) Where Assessed - Toileting Clothing Manipulation: Standing Toileting - Hygiene: +2 Total assistance;Comment for patient % (Pt 0%) Where Assessed - Toileting Hygiene: Standing Tub/Shower Transfer: Not assessed Tub/Shower Transfer Method: Not assessed Equipment Used:  Rolling walker ADL Comments: Pt fatigued very quickly, very hesitant when mobilizing. Vision/Perception    Cognition Cognition Arousal/Alertness: Awake/alert Overall Cognitive Status: Appears within functional limits for tasks assessed Orientation Level: Oriented X4 Sensation/Coordination  Extremity Assessment RUE Assessment RUE Assessment: Within Functional Limits LUE Assessment LUE Assessment: Within Functional Limits Mobility  Bed Mobility Supine to Sit: 1: +2 Total assist;With rails;HOB elevated (Comment degrees);Patient percentage (comment) Supine to Sit Details (indicate cue type and reason): Pt 40% w/max vcs for technique, utilization of rail Transfers Transfers: Yes Sit to Stand: 1: +2 Total assist;From elevated surface;With upper extremity assist;From bed Sit to Stand Details (indicate cue type and reason): Pt 70% to weight shift, cues for hand placement. Stand to Sit: 1: +2 Total assist;Patient percentage (comment);To chair/3-in-1;With upper extremity assist;With armrests Stand to Sit Details: tactile cues to reach for arm rests, lower slowly into chair. Exercises   End of Session OT - End of Session Equipment Utilized During Treatment: Gait belt Activity Tolerance: Patient tolerated treatment well Patient left: in chair;with call bell in reach General Behavior During Session: [facility] for tasks performed Cognition: [facility] for tasks performed   Darnella Zeiter A, OTR/L [phone] 06/29/2011, 12:39 PM

## 2011-06-29 NOTE — Progress Notes (Signed)
Subjective: Patient is doing well, no nausea no vomiting. Postop course complicated by blood loss anemia. He did require a transfusion. He does have some swelling in his right hip region and some discomfort otherwise no complaints  Objective: Vital signs in last 24 hours: Temp:  [97.5 F (36.4 C)-98.4 F (36.9 C)] 98.3 F (36.8 C) (02/25 0541) Pulse Rate:  [81-101] 101  (02/25 0541) Resp:  [16-20] 16  (02/25 1200) BP: (117-163)/(58-83) 163/83 mmHg (02/25 0541) SpO2:  [95 %-100 %] 97 % (02/25 0800) Weight change:  Last BM Date: 06/28/11  Intake/Output from previous day: 02/24 0701 - 02/25 0700 In: 2506.3 [P.O.:480; I.V.:1698.8; Blood:327.5] Out: 1675 [Urine:1675] Intake/Output this shift: Total I/O In: 1031.8 [P.O.:240; I.V.:791.8] Out: 275 [Urine:275]  General appearance: alert and cooperative Resp: clear to auscultation bilaterally Cardio: regular rate and rhythm, S1, S2 normal, no murmur, click, rub or gallop Extremities: Limited exam secondary to pain and swelling, patient has significant swelling in the right hip  Lab Results:  Results for orders placed during the hospital encounter of 06/24/11 (from the past 24 hour(s))  HEMOGLOBIN AND HEMATOCRIT, BLOOD     Status: Abnormal   Collection Time   06/28/11 11:24 PM      Component Value Range   Hemoglobin 10.6 (*) 13.0 - 17.0 (g/dL)   HCT 16.1 (*) 09.6 - 52.0 (%)  BASIC METABOLIC PANEL     Status: Abnormal   Collection Time   06/29/11  3:51 AM      Component Value Range   Sodium 140  135 - 145 (mEq/L)   Potassium 3.4 (*) 3.5 - 5.1 (mEq/L)   Chloride 105  96 - 112 (mEq/L)   CO2 28  19 - 32 (mEq/L)   Glucose, Bld 120 (*) 70 - 99 (mg/dL)   BUN 15  6 - 23 (mg/dL)   Creatinine, Ser 0.45  0.50 - 1.35 (mg/dL)   Calcium 8.3 (*) 8.4 - 10.5 (mg/dL)   GFR calc non Af Amer 62 (*) >90 (mL/min)   GFR calc Af Amer 71 (*) >90 (mL/min)  PROTIME-INR     Status: Abnormal   Collection Time   06/29/11  8:15 AM      Component Value  Range   Prothrombin Time 26.0 (*) 11.6 - 15.2 (seconds)   INR 2.34 (*) 0.00 - 1.49       Studies/Results: No results found.  Medications:  Prior to Admission:  Prescriptions prior to admission  Medication Sig Dispense Refill  . clopidogrel (PLAVIX) 75 MG tablet Take 75 mg by mouth daily.      Marland Kitchen diltiazem (DILACOR XR) 180 MG 24 hr capsule Take 180 mg by mouth daily.      . metoprolol (LOPRESSOR) 100 MG tablet Take 100 mg by mouth 2 (two) times daily.      . pantoprazole (PROTONIX) 40 MG tablet Take 40 mg by mouth daily.      . simvastatin (ZOCOR) 20 MG tablet Take 10 mg by mouth every evening. Pt cuts tab in half for 10 mg dose      . warfarin (COUMADIN) 5 MG tablet Take 2.5-5 mg by mouth See admin instructions. Pt takes 1/2 of 5 mg  Tablet for 2.5 mg dose on Monday,Wednesday,Friday,sunday. On Tuesday,Thursday, Saturday pt takes 5 mg.       Scheduled:   . diltiazem  180 mg Oral Daily  . docusate sodium  100 mg Oral BID  . ferrous fumarate  1 tablet Oral BID  .  furosemide  20 mg Intravenous Once  . metoprolol tartrate  100 mg Oral BID  . pantoprazole  40 mg Oral Q1200  . simvastatin  10 mg Oral q1800  . warfarin  2 mg Oral ONCE-1800  . warfarin  2.5 mg Oral ONCE-1800   Continuous:   . DISCONTD: sodium chloride 75 mL/hr at 06/28/11 2206    Assessment/Plan:  Fall with resultant hip fracture, status post repair, plans for discharge toSkilled nursing facility Postop anemiaStatus post transfusion Hypertension History of PE, patient had a high probability VQ scan, plans were for 6 months of Coumadin  Asthma Disposition to skilled nursing facility when bed available  LOS: 5 days   Japhet Morgenthaler D 06/29/2011, 1:24 PM

## 2011-06-29 NOTE — Progress Notes (Signed)
ANTICOAGULATION CONSULT NOTE - Follow Up Consult  Pharmacy Consult for Warfarin Indication: pulmonary embolus s/p ORIF 2/22  No Known Allergies  Patient Measurements: Height: 5\' 6"  (167.6 cm) Weight: 134 lb (60.782 kg) IBW/kg (Calculated) : 63.8    Vital Signs: Temp: 98.3 F (36.8 C) (02/25 0541) Temp src: Oral (02/25 0541) BP: 163/83 mmHg (02/25 0541) Pulse Rate: 101  (02/25 0541)  Labs:  [name] 06/29/11 0815 06/29/11 0351 06/28/11 2324 06/28/11 0413 06/27/11 0440  HGB -- -- 10.6* 7.3* --  HCT -- -- 32.1* 22.1* 26.9*  PLT -- -- -- -- 199  APTT -- -- -- -- --  LABPROT 26.0* -- -- 29.0* 22.0*  INR 2.34* -- -- 2.69* 1.89*  HEPARINUNFRC -- -- -- -- --  CREATININE -- 1.13 -- 1.15 1.07  CKTOTAL -- -- -- -- --  CKMB -- -- -- -- --  TROPONINI -- -- -- -- --   Estimated Creatinine Clearance: 48.6 ml/min (by C-G formula based on Cr of 1.13).   Medications:  Scheduled:     . diltiazem  180 mg Oral Daily  . docusate sodium  100 mg Oral BID  . ferrous fumarate  1 tablet Oral BID  . furosemide  20 mg Intravenous Once  . metoprolol tartrate  100 mg Oral BID  . pantoprazole  40 mg Oral Q1200  . simvastatin  10 mg Oral q1800  . warfarin  2 mg Oral ONCE-1800  . DISCONTD: enoxaparin (LOVENOX) injection  60 mg Subcutaneous Q12H    Assessment:  77 YOM w/ Hx PE on chronic coumadin PTA  Admitted 2/20 after fall and hip fx while here visiting spouse. INR was supratx 2/20 (3.22)   Underwent ORIF of R hip on 2/22 after INR down (1.77)  Home Coumadin dose = 2.5mg  MWF, 5mg  other days. Last dose PTA = 2/19  Coumadin 4mg  2/22, 5mg  2/23, 2mg  2/24  No bleeding reported, no Hgb checked 2/25. Ortho PA-C note indicates only scant amt serosanguinous drainage from wound.  Goal of Therapy:  INR 2-3   Plan:   Will now try restarting patient's home regimen as above - 2.5mg  tonight  Check daily PT/INR   Hessie Knows, PharmD, BCPS pager [phone] 06/29/2011 9:22 AM

## 2011-06-29 NOTE — Progress Notes (Signed)
Physical Therapy Treatment Patient Details Name: [name] MRN: [id number] DOB: [birth year] Today's Date: 06/29/2011  R ORIF POD #3 WBAT  Fall/fx 13;30 - 13:55 1 gt  1 ta  PT Assessment/Plan  PT - Assessment/Plan Comments on Treatment Session: Pt OOB in recliner via nursing with lift.  Pt eating lunch.  Instructed pt on WBAT and explained why he was so sore.  Attempted amb twice, first with a RW however pt unable to support self fully upright so 2nd amb used an Fara Boros.  Great difficulty weight shifting onto R LE to advance L LE.  Pt plans to D/C to SNF for Rehab. PT Plan: Discharge plan remains appropriate Follow Up Recommendations: Skilled nursing facility Equipment Recommended: Defer to next venue PT Goals  Acute Rehab PT Goals PT Goal Formulation: With patient Pt will go Supine/Side to Sit: with min assist PT Goal: Supine/Side to Sit - Progress: Progressing toward goal Pt will go Sit to Stand: with min assist PT Goal: Sit to Stand - Progress: Progressing toward goal Pt will go Stand to Sit: with min assist PT Goal: Stand to Sit - Progress: Progressing toward goal Pt will Ambulate: 16 - 50 feet;with min assist;with rolling walker PT Goal: Ambulate - Progress: Progressing toward goal Pt will Perform Home Exercise Program: with supervision, verbal cues required/provided PT Goal: Perform Home Exercise Program - Progress: Progressing toward goal  PT Treatment Precautions/Restrictions  Precautions Precautions: Fall Required Braces or Orthoses: No Restrictions Weight Bearing Restrictions: No RLE Weight Bearing: Weight bearing as tolerated Other Position/Activity Restrictions: Pt instructed on WBAT Mobility (including Balance) Bed Mobility Bed Mobility: No (Pt OOB in recliner) Supine to Sit: 1: +2 Total assist;With rails;HOB elevated (Comment degrees);Patient percentage (comment) Supine to Sit Details (indicate cue type and reason): Pt 40% w/max vcs for technique,  utilization of rail Transfers Transfers: Yes Sit to Stand: 1: +2 Total assist;From chair/3-in-1 Sit to Stand Details (indicate cue type and reason): Total assist + 2 pt 35% with 75% VC's on proper tech and hand placement Stand to Sit: 1: +2 Total assist;To chair/3-in-1 Stand to Sit Details: Total assist + 2 pt 35% with 75% VC's on hand placement to reach back and advance R LE prior to sit Ambulation/Gait Ambulation/Gait: Yes Ambulation/Gait Assistance: 1: +2 Total assist Ambulation/Gait Assistance Details (indicate cue type and reason): Total assist + 2 pt <25% with RW however pt unable to support self thru B UE's so 2nd amb used an Scientist, research (medical) (B platform) pt better able however unable to advance R LE so required PA to advance R LE and weight shift to R in order to advance L LE.  Very limited activity tolerance and amb distance.   Ambulation Distance (Feet): 3 Feet Assistive device: Bilateral platform walker;Other (Comment) Fara Boros) Gait Pattern: Step-to pattern;Trunk flexed;Decreased step length - left;Decreased stance time - right Gait velocity: Pt c/o "alot" R hip pain with activity, pre medicated and ICE pack applied Stairs: No Wheelchair Mobility Wheelchair Mobility: No    Exercise  General Exercises - Lower Extremity Ankle Circles/Pumps: AROM;Both;20 reps;Seated Gluteal Sets: AROM;Both;10 reps;Seated Long Arc Quad: AROM;Right;10 reps;Seated End of Session PT - End of Session Equipment Utilized During Treatment: Gait belt Activity Tolerance: Patient limited by fatigue;Patient limited by pain Patient left: in chair;with call bell in reach;Other (comment) (lift pad in chair under pt) General Behavior During Session: [facility] for tasks performed Cognition: [facility] for tasks performed  Felecia Shelling  PTA [facility]  Acute  Rehab Pager  [phone]   

## 2011-06-30 ENCOUNTER — Inpatient Hospital Stay (HOSPITAL_COMMUNITY): Payer: Medicare HMO

## 2011-06-30 LAB — BASIC METABOLIC PANEL
BUN: 17 mg/dL (ref 6–23)
Calcium: 8.6 mg/dL (ref 8.4–10.5)
Creatinine, Ser: 1.09 mg/dL (ref 0.50–1.35)
GFR calc Af Amer: 75 mL/min — ABNORMAL LOW (ref >90)
GFR calc non Af Amer: 64 mL/min — ABNORMAL LOW (ref >90)

## 2011-06-30 LAB — PROTIME-INR
INR: 2.26 — ABNORMAL HIGH (ref 0.00–1.49)
Prothrombin Time: 25.3 seconds — ABNORMAL HIGH (ref 11.6–15.2)

## 2011-06-30 MED ORDER — WARFARIN SODIUM 2.5 MG PO TABS
2.5000 mg | ORAL_TABLET | Freq: Once | ORAL | Status: AC
  Administered 2011-06-30: 2.5 mg via ORAL
  Filled 2011-06-30 (×2): qty 1

## 2011-06-30 MED ORDER — FLEET ENEMA 7-19 GM/118ML RE ENEM
1.0000 | ENEMA | Freq: Once | RECTAL | Status: AC
Start: 2011-06-30 — End: 2011-06-30
  Administered 2011-06-30: 1 via RECTAL
  Filled 2011-06-30: qty 1

## 2011-06-30 MED ORDER — POLYETHYLENE GLYCOL 3350 17 G PO PACK
17.0000 g | PACK | Freq: Every day | ORAL | Status: DC
  Administered 2011-06-30 – 2011-07-02 (×3): 17 g via ORAL
  Filled 2011-06-30 (×5): qty 1

## 2011-06-30 NOTE — Progress Notes (Signed)
Physical Therapy Treatment Patient Details Name: [name] MRN: [id number] DOB: Feb 22, 1934 Today's Date: 06/30/2011  R ORIF 2nd Fx/fall   WBAT   POD# 4 11:55 - 12:30 1 ta  1 gt  PT Assessment/Plan  PT - Assessment/Plan Comments on Treatment Session: Assisted pt OOB to [facility] then amb with + 2 assist and Fara Boros.  Pt progressing slowly and plans to D/C to SNF for Rehab. PT Plan: Discharge plan remains appropriate Follow Up Recommendations: Skilled nursing facility Equipment Recommended: Defer to next venue PT Goals  Acute Rehab PT Goals PT Goal Formulation: With patient Pt will go Supine/Side to Sit: with min assist PT Goal: Supine/Side to Sit - Progress: Progressing toward goal Pt will go Sit to Stand: with min assist PT Goal: Sit to Stand - Progress: Progressing toward goal Pt will go Stand to Sit: with min assist PT Goal: Stand to Sit - Progress: Progressing toward goal Pt will Ambulate: 16 - 50 feet;with min assist;with rolling walker PT Goal: Ambulate - Progress: Progressing toward goal Pt will Perform Home Exercise Program: with supervision, verbal cues required/provided PT Goal: Perform Home Exercise Program - Progress: Progressing toward goal  PT Treatment Precautions/Restrictions  Precautions Precautions: Fall Required Braces or Orthoses: No Restrictions Weight Bearing Restrictions: Yes RLE Weight Bearing: Weight bearing as tolerated Other Position/Activity Restrictions: Pt instructed on WBAT Mobility (including Balance) Bed Mobility Bed Mobility: Yes Supine to Sit: 1: +2 Total assist Supine to Sit Details (indicate cue type and reason): Total assist + 2 pt 65% with HOB elevated 45' and increased time plus Mod Assist to support R LE off the bed Transfers Transfers: Yes Sit to Stand: 1: +2 Total assist;From elevated surface;From bed;From toilet Sit to Stand Details (indicate cue type and reason): Total assist + 2 pt 45% with 75% VC's on proper tech, hand  placment and increased time. Stand to Sit: 1: +2 Total assist;To chair/3-in-1 Stand to Sit Details: Total assist + 2 pt 45% with 75% VC's on proper tech, hand placement, trun completion and safety. Ambulation/Gait Ambulation/Gait: Yes Ambulation/Gait Assistance: 1: +2 Total assist Ambulation/Gait Assistance Details (indicate cue type and reason): Total assist +2 pt 35% using Fara Boros and 75% physical assist to advance R LE and weght shift to advance L LE.  Pt required increased time and chair following for safety. Ambulation Distance (Feet): 12 Feet Assistive device: Eva walker Gait Pattern: Step-to pattern;Trunk flexed;Decreased stance time - right;Decreased step length - left Gait velocity: Pt having no pain, just soreness. Stairs: No Naval architect Mobility: No    Exercise    End of Session PT - End of Session Equipment Utilized During Treatment: Gait belt Activity Tolerance: Patient limited by fatigue Patient left: in chair;with call bell in reach Nurse Communication: Need for lift equipment;Other (comment) (lift pad in chair under pt) General Behavior During Session: [facility] for tasks performed Cognition: [facility] for tasks performed  Felecia Shelling  PTA [facility]  Acute  Rehab Pager     [phone]

## 2011-06-30 NOTE — Progress Notes (Signed)
ANTICOAGULATION CONSULT NOTE - Follow Up Consult  Pharmacy Consult for Warfarin Indication: pulmonary embolus s/p ORIF 2/22  No Known Allergies  Patient Measurements: Height: 5\' 6"  (167.6 cm) Weight: 134 lb (60.782 kg) IBW/kg (Calculated) : 63.8    Vital Signs: Temp: 98.6 F (37 C) (02/26 0557) Temp src: Oral (02/26 0557) BP: 149/80 mmHg (02/26 0557) Pulse Rate: 101  (02/26 0557)  Labs:  Alvira Philips 06/30/11 0357 06/29/11 0815 06/29/11 0351 06/28/11 2324 06/28/11 0413  HGB -- -- -- 10.6* 7.3*  HCT -- -- -- 32.1* 22.1*  PLT -- -- -- -- --  APTT -- -- -- -- --  LABPROT 25.3* 26.0* -- -- 29.0*  INR 2.26* 2.34* -- -- 2.69*  HEPARINUNFRC -- -- -- -- --  CREATININE 1.09 -- 1.13 -- 1.15  CKTOTAL -- -- -- -- --  CKMB -- -- -- -- --  TROPONINI -- -- -- -- --   Estimated Creatinine Clearance: 50.4 ml/min (by C-G formula based on Cr of 1.09).   Medications:  Scheduled:     . diltiazem  180 mg Oral Daily  . docusate sodium  100 mg Oral BID  . ferrous fumarate  1 tablet Oral BID  . metoprolol tartrate  100 mg Oral BID  . pantoprazole  40 mg Oral Q1200  . polyethylene glycol  17 g Oral Daily  . simvastatin  10 mg Oral q1800  . sodium phosphate  1 enema Rectal Once  . warfarin  2.5 mg Oral ONCE-1800    Assessment:  77 YOM w/ Hx PE on chronic coumadin PTA  Admitted 2/20 after fall and hip fx while here visiting spouse. INR was supratx 2/20 (3.22)   Underwent ORIF of R hip on 2/22 after INR down (1.77)  Home Coumadin dose = 2.5mg  MWF, 5mg  other days. Last dose PTA = 2/19  Coumadin 4mg  2/22, 5mg  2/23, 2mg  2/24, 2.5mg  2/25  No bleeding reported, no Hgb checked 2/25. Ortho PA-C note indicates only scant amt serosanguinous drainage from wound.  Goal of Therapy:  INR 2-3   Plan:   Continue with Coumadin 2.5mg  today (as per home regimen)  Check daily PT/INR   Otho Bellows, PharmD,  Pager [phone] -06/30/2011 11:56 AM

## 2011-06-30 NOTE — Progress Notes (Signed)
Subjective: Patient still compliance with some abdominal discomfort, he does admit to not moving his bowels. There is no chest pain no shortness of breath  Objective: Vital signs in last 24 hours: Temp:  [97.3 F (36.3 C)-98.7 F (37.1 C)] 98.6 F (37 C) (02/26 0557) Pulse Rate:  [97-114] 101  (02/26 0557) Resp:  [16-20] 18  (02/26 0800) BP: (127-161)/(67-80) 149/80 mmHg (02/26 0557) SpO2:  [93 %-96 %] 96 % (02/26 0800) Weight change:  Last BM Date: 06/28/11  Intake/Output from previous day: 02/25 0701 - 02/26 0700 In: 1751.8 [P.O.:960; I.V.:791.8] Out: 1075 [Urine:1075] Intake/Output this shift:    General appearance: alert and cooperative Resp: clear to auscultation bilaterally GI: slight protuberant, positive bowel sounds, no guarding, no rebound tenderness Extremities: right hip /thigh  still with some swelling, no calf pain no edema  Lab Results:  Results for orders placed during the hospital encounter of 06/24/11 (from the past 24 hour(s))  PROTIME-INR     Status: Abnormal   Collection Time   06/30/11  3:57 AM      Component Value Range   Prothrombin Time 25.3 (*) 11.6 - 15.2 (seconds)   INR 2.26 (*) 0.00 - 1.49   BASIC METABOLIC PANEL     Status: Abnormal   Collection Time   06/30/11  3:57 AM      Component Value Range   Sodium 140  135 - 145 (mEq/L)   Potassium 3.3 (*) 3.5 - 5.1 (mEq/L)   Chloride 107  96 - 112 (mEq/L)   CO2 25  19 - 32 (mEq/L)   Glucose, Bld 147 (*) 70 - 99 (mg/dL)   BUN 17  6 - 23 (mg/dL)   Creatinine, Ser 1.61  0.50 - 1.35 (mg/dL)   Calcium 8.6  8.4 - 09.6 (mg/dL)   GFR calc non Af Amer 64 (*) >90 (mL/min)   GFR calc Af Amer 75 (*) >90 (mL/min)      Studies/Results: Dg Abd Portable 1v  06/30/2011  *RADIOLOGY REPORT*  Clinical data:  Pain and distention  PORTABLE ABDOMEN ONE-VIEW  Comparison:  06/26/2011  Findings:  Ventral hernia mesh laparoscopic sutures stable. Fixation hardware has been placed across a right femoral  intertrochanteric fracture, partially seen.  Right iliac stents. Small bowel is nondilated.  Moderate fecal material in the proximal colon and rectum.  Mild dextroscoliosis of the lumbar spine apex L2 with multilevel degenerative change.  IMPRESSION: 1.  Nonobstructive bowel gas pattern with moderate colonic fecal material. 2.  Postop changes as above.  Original Report Authenticated By: Osa Craver, M.D.    Medications:  Prior to Admission:  Prescriptions prior to admission  Medication Sig Dispense Refill  . clopidogrel (PLAVIX) 75 MG tablet Take 75 mg by mouth daily.      Marland Kitchen diltiazem (DILACOR XR) 180 MG 24 hr capsule Take 180 mg by mouth daily.      . metoprolol (LOPRESSOR) 100 MG tablet Take 100 mg by mouth 2 (two) times daily.      . pantoprazole (PROTONIX) 40 MG tablet Take 40 mg by mouth daily.      . simvastatin (ZOCOR) 20 MG tablet Take 10 mg by mouth every evening. Pt cuts tab in half for 10 mg dose      . warfarin (COUMADIN) 5 MG tablet Take 2.5-5 mg by mouth See admin instructions. Pt takes 1/2 of 5 mg  Tablet for 2.5 mg dose on Monday,Wednesday,Friday,sunday. On Tuesday,Thursday, Saturday pt takes 5 mg.  Scheduled:   . diltiazem  180 mg Oral Daily  . docusate sodium  100 mg Oral BID  . ferrous fumarate  1 tablet Oral BID  . metoprolol tartrate  100 mg Oral BID  . pantoprazole  40 mg Oral Q1200  . simvastatin  10 mg Oral q1800  . sodium phosphate  1 enema Rectal Once  . warfarin  2.5 mg Oral ONCE-1800   Continuous:   Assessment/Plan: Fall with resultant hip fracture, status post repair, plans for discharge toSkilled nursing facility  Postop anemiaStatus post transfusion , Check followup H&H Hypertension , Stable History of PE, patient had a high probability VQ scan, plans were for 6 months of Coumadin  Asthma  Constipation, fleets enema , Continue stool softeners, mobilize, try to limit pain medication Peripheral vascular disease, resume Plavix when stable  H&H Disposition to skilled nursing facility when bed available      LOS: 6 days   Clayburn Weekly D 06/30/2011, 10:52 AM

## 2011-06-30 NOTE — Progress Notes (Signed)
Georgena Spurling, MD   [name], PA-C [address], [place], [place]  [address]                             [phone]   PROGRESS NOTE  Subjective:  negative for Chest Pain  negative for Shortness of Breath  negative for Nausea/Vomiting   negative for Calf Pain  negative for Bowel Movement   Tolerating Diet: yes         Patient reports pain as 3 on 0-10 scale.    Objective: Vital signs in last 24 hours:   Patient Vitals for the past 24 hrs:  BP Temp Temp src Pulse Resp SpO2  06/30/11 0800 - - - - 18  96 %  06/30/11 0557 149/80 mmHg 98.6 F (37 C) Oral 101  18  96 %  06/30/11 0400 - - - - 18  95 %  06/30/11 0000 - - - - 18  96 %  06/29/11 2145 134/76 mmHg - - 102  - -  06/29/11 2130 161/67 mmHg 98.7 F (37.1 C) Oral 114  20  95 %  06/29/11 2000 - - - - 18  95 %  06/29/11 1600 - - - - 20  93 %  06/29/11 1400 127/73 mmHg 97.3 F (36.3 C) Oral 97  18  93 %    @flow {1959:LAST@   Intake/Output from previous day:   02/25 0701 - 02/26 0700 In: 1751.8 [P.O.:960; I.V.:791.8] Out: 1075 [Urine:1075]   Intake/Output this shift:       Intake/Output      02/25 0701 - 02/26 0700 02/26 0701 - 02/27 0700   P.O. 960    I.V. (mL/kg) 791.8 (13)    Blood     Total Intake(mL/kg) 1751.8 (28.8)    Urine (mL/kg/hr) 1075 (0.7)    Total Output 1075    Net +676.8            LABORATORY DATA:  Basename 06/28/11 2324 06/28/11 0413 06/27/11 0440 06/25/11 0335 06/24/11 0830  WBC -- -- 10.1 6.2 5.2  HGB 10.6* 7.3* 8.6* 10.1* 10.3*  HCT 32.1* 22.1* 26.9* 31.3* 33.9*  PLT -- -- 199 173 197    Basename 06/30/11 0357 06/29/11 0351 06/28/11 0413 06/27/11 0440 06/24/11 0830  NA 140 140 142 134* 139  K 3.3* 3.4* 4.0 4.2 3.7  CL 107 105 111 103 106  CO2 25 28 24 24 23   BUN 17 15 20 17 12   CREATININE 1.09 1.13 1.15 1.07 1.11  GLUCOSE 147* 120* 124* 173* 127*  CALCIUM 8.6 8.3* 7.8* 8.2* 8.6   Lab Results  Component Value Date   INR 2.26* 06/30/2011   INR 2.34* 06/29/2011     INR 2.69* 06/28/2011    Examination:  General appearance: alert, cooperative and no distress Extremities: extremities normal, atraumatic, no cyanosis or edema and Homans sign is negative, no sign of DVT  Wound Exam: clean, dry, intact   Drainage:  Scant/small amount Serosanguinous exudate  Motor Exam: EHL and FHL Intact  Sensory Exam: Deep Peroneal normal  Vascular Exam:    Assessment:    4 Days Post-Op  Procedure(s) (LRB): COMPRESSION HIP (Right)  ADDITIONAL DIAGNOSIS:  Active Problems:  PE (pulmonary embolism)  Chronic anticoagulation  Fall from standing  Fracture of hip, right, closed  Acute Blood Loss Anemia   Plan: Physical Therapy as ordered Weight Bearing as Tolerated (WBAT)  DVT Prophylaxis:  Coumadin  DISCHARGE PLAN: Skilled Nursing Facility/Rehab  DISCHARGE NEEDS: HHPT, CPM, Walker and 3-in-1 comode seat  1. Continue PT and pain control 2. Okay to d/c from ortho standpoint 3. Dressing changed today. 4. D/c ton snf when bed available. 5. Follow up with stephen lucey 07/14/11          Ashni Lonzo 06/30/2011, 12:04 PM

## 2011-07-01 LAB — CBC
Hemoglobin: 12.1 g/dL — ABNORMAL LOW (ref 13.0–17.0)
MCH: 27.1 pg (ref 26.0–34.0)
MCHC: 32.4 g/dL (ref 30.0–36.0)
Platelets: 313 10*3/uL (ref 150–400)
RDW: 16.1 % — ABNORMAL HIGH (ref 11.5–15.5)

## 2011-07-01 LAB — BASIC METABOLIC PANEL
BUN: 27 mg/dL — ABNORMAL HIGH (ref 6–23)
Calcium: 9.1 mg/dL (ref 8.4–10.5)
Creatinine, Ser: 1.31 mg/dL (ref 0.50–1.35)
GFR calc non Af Amer: 52 mL/min — ABNORMAL LOW (ref >90)
Glucose, Bld: 160 mg/dL — ABNORMAL HIGH (ref 70–99)

## 2011-07-01 MED ORDER — WARFARIN SODIUM 5 MG PO TABS
5.0000 mg | ORAL_TABLET | Freq: Once | ORAL | Status: DC
  Filled 2011-07-01: qty 1

## 2011-07-01 MED ORDER — WARFARIN - PHARMACIST DOSING INPATIENT
Freq: Every day | Status: DC
  Filled 2011-07-01 (×3): qty 1

## 2011-07-01 MED ORDER — WARFARIN SODIUM 2.5 MG PO TABS
2.5000 mg | ORAL_TABLET | Freq: Once | ORAL | Status: AC
  Administered 2011-07-01: 2.5 mg via ORAL
  Filled 2011-07-01: qty 1

## 2011-07-01 NOTE — Progress Notes (Signed)
ANTICOAGULATION CONSULT NOTE - Follow Up Consult  Pharmacy Consult for Warfarin Indication: h/o pulmonary embolus now s/p ORIF 2/22  No Known Allergies  Patient Measurements: Height: 5\' 6"  (167.6 cm) Weight: 134 lb (60.782 kg) IBW/kg (Calculated) : 63.8    Vital Signs: Temp: 97.3 F (36.3 C) (02/27 0600) Temp src: Oral (02/27 0600) BP: 147/83 mmHg (02/27 0600) Pulse Rate: 111  (02/27 0600)  Labs:  [name] 07/01/11 0344 06/30/11 0357 06/29/11 0815 06/29/11 0351 06/28/11 2324  HGB 12.1* -- -- -- 10.6*  HCT 37.3* -- -- -- 32.1*  PLT 313 -- -- -- --  APTT -- -- -- -- --  LABPROT 24.1* 25.3* 26.0* -- --  INR 2.12* 2.26* 2.34* -- --  HEPARINUNFRC -- -- -- -- --  CREATININE 1.31 1.09 -- 1.13 --  CKTOTAL -- -- -- -- --  CKMB -- -- -- -- --  TROPONINI -- -- -- -- --   Estimated Creatinine Clearance: 41.9 ml/min (by C-G formula based on Cr of 1.31).  Assessment:  50 YOM w/ h/o PE on chronic coumadin PTA  Admitted 2/20 after fall and hip fx while here visiting spouse. INR was supratx 2/20 (3.22)   Underwent ORIF of R hip on 2/22 after INR down (1.77)  Home Coumadin dose = 2.5 mg MWF,Sunday and 5mg  other days.   Ok to d/c from Ortho standpoint  INR 2.12 today after 3 days of low Coumadin dosing. Hgb back up to 12.1  Goal of Therapy:  INR 2-3   Plan:   Will resume home regimen - Coumadin 2.5 mg po x 1 tonight.  Daily PT/INR   Geoffry Paradise, PharmD.   Pager:  [phone] 11:13 AM

## 2011-07-01 NOTE — Progress Notes (Signed)
Subjective: No new complaints  Objective: Vital signs in last 24 hours: Temp:  [97.3 F (36.3 C)-98.2 F (36.8 C)] 97.3 F (36.3 C) (02/27 0600) Pulse Rate:  [99-111] 111  (02/27 0600) Resp:  [18] 18  (02/27 0800) BP: (147-183)/(83-101) 147/83 mmHg (02/27 0600) SpO2:  [96 %-97 %] 96 % (02/26 2135) Weight change:  Last BM Date: 06/30/11  Intake/Output from previous day: 02/26 0701 - 02/27 0700 In: 560 [P.O.:560] Out: 850 [Urine:850] Intake/Output this shift:    General appearance: alert and cooperative Resp: clear to auscultation bilaterally Cardio: regular rate and rhythm, S1, S2 normal, no murmur, click, rub or gallop GI: slight protuberant, no hepatosplenomegaly  Lab Results:  Results for orders placed during the hospital encounter of 06/24/11 (from the past 24 hour(s))  PROTIME-INR     Status: Abnormal   Collection Time   07/01/11  3:44 AM      Component Value Range   Prothrombin Time 24.1 (*) 11.6 - 15.2 (seconds)   INR 2.12 (*) 0.00 - 1.49   BASIC METABOLIC PANEL     Status: Abnormal   Collection Time   07/01/11  3:44 AM      Component Value Range   Sodium 141  135 - 145 (mEq/L)   Potassium 3.8  3.5 - 5.1 (mEq/L)   Chloride 107  96 - 112 (mEq/L)   CO2 24  19 - 32 (mEq/L)   Glucose, Bld 160 (*) 70 - 99 (mg/dL)   BUN 27 (*) 6 - 23 (mg/dL)   Creatinine, Ser 1.61  0.50 - 1.35 (mg/dL)   Calcium 9.1  8.4 - 09.6 (mg/dL)   GFR calc non Af Amer 52 (*) >90 (mL/min)   GFR calc Af Amer 60 (*) >90 (mL/min)  CBC     Status: Abnormal   Collection Time   07/01/11  3:44 AM      Component Value Range   WBC 12.0 (*) 4.0 - 10.5 (K/uL)   RBC 4.47  4.22 - 5.81 (MIL/uL)   Hemoglobin 12.1 (*) 13.0 - 17.0 (g/dL)   HCT 04.5 (*) 40.9 - 52.0 (%)   MCV 83.4  78.0 - 100.0 (fL)   MCH 27.1  26.0 - 34.0 (pg)   MCHC 32.4  30.0 - 36.0 (g/dL)   RDW 81.1 (*) 91.4 - 15.5 (%)   Platelets 313  150 - 400 (K/uL)      Studies/Results: Dg Abd Portable 1v  06/30/2011  *RADIOLOGY REPORT*   Clinical data:  Pain and distention  PORTABLE ABDOMEN ONE-VIEW  Comparison:  06/26/2011  Findings:  Ventral hernia mesh laparoscopic sutures stable. Fixation hardware has been placed across a right femoral intertrochanteric fracture, partially seen.  Right iliac stents. Small bowel is nondilated.  Moderate fecal material in the proximal colon and rectum.  Mild dextroscoliosis of the lumbar spine apex L2 with multilevel degenerative change.  IMPRESSION: 1.  Nonobstructive bowel gas pattern with moderate colonic fecal material. 2.  Postop changes as above.  Original Report Authenticated By: Osa Craver, M.D.    Medications:  Prior to Admission:  Prescriptions prior to admission  Medication Sig Dispense Refill  . clopidogrel (PLAVIX) 75 MG tablet Take 75 mg by mouth daily.      Marland Kitchen diltiazem (DILACOR XR) 180 MG 24 hr capsule Take 180 mg by mouth daily.      . metoprolol (LOPRESSOR) 100 MG tablet Take 100 mg by mouth 2 (two) times daily.      . pantoprazole (  PROTONIX) 40 MG tablet Take 40 mg by mouth daily.      . simvastatin (ZOCOR) 20 MG tablet Take 10 mg by mouth every evening. Pt cuts tab in half for 10 mg dose      . warfarin (COUMADIN) 5 MG tablet Take 2.5-5 mg by mouth See admin instructions. Pt takes 1/2 of 5 mg  Tablet for 2.5 mg dose on Monday,Wednesday,Friday,sunday. On Tuesday,Thursday, Saturday pt takes 5 mg.       Scheduled:   . diltiazem  180 mg Oral Daily  . docusate sodium  100 mg Oral BID  . ferrous fumarate  1 tablet Oral BID  . metoprolol tartrate  100 mg Oral BID  . pantoprazole  40 mg Oral Q1200  . polyethylene glycol  17 g Oral Daily  . simvastatin  10 mg Oral q1800  . warfarin  2.5 mg Oral ONCE-1800  . warfarin  2.5 mg Oral ONCE-1800  . Warfarin - Pharmacist Dosing Inpatient   Does not apply q1800  . DISCONTD: warfarin  5 mg Oral ONCE-1800   Continuous:   Assessment/Plan: Fall with resultant hip fracture, status post repair, plans for discharge toSkilled  nursing facility  Postop anemiaStatus post transfusion , Check followup H&H  Hypertension , Stable  History of PE, patient had a high probability VQ scan, plans were for 6 months of Coumadin  Asthma  Constipation, fleets enema , Continue stool softeners, mobilize, try to limit pain medication  Peripheral vascular disease, resume Plavix when stable H&H  Disposition to skilled nursing facility when bed available       LOS: 7 days   [name] D 07/01/2011, 12:40 PM

## 2011-07-01 NOTE — Progress Notes (Signed)
Physical Therapy Treatment Patient Details Name: [name] MRN: [id number] DOB: April 22, 1934 Today's Date: 07/01/2011  R ORIF 2nd fall/ Fx POD # 5 13:45 - 14;10 1 gt  1 ta  PT Assessment/Plan  PT - Assessment/Plan Comments on Treatment Session: Pt requested to have his PT in the afternoon.  Pt states he does not feel well today, "stomach" issues.  Pt plans to D/C to SNF for Rehab. PT Plan: Discharge plan remains appropriate Follow Up Recommendations: Skilled nursing facility Equipment Recommended: Defer to next venue PT Goals  Acute Rehab PT Goals PT Goal Formulation: With patient Pt will go Supine/Side to Sit: with min assist PT Goal: Supine/Side to Sit - Progress: Progressing toward goal Pt will go Sit to Stand: with min assist PT Goal: Sit to Stand - Progress: Progressing toward goal Pt will go Stand to Sit: with min assist PT Goal: Stand to Sit - Progress: Progressing toward goal Pt will Ambulate: 16 - 50 feet;with min assist;with rolling walker PT Goal: Ambulate - Progress: Progressing toward goal Pt will Perform Home Exercise Program: with supervision, verbal cues required/provided PT Goal: Perform Home Exercise Program - Progress: Progressing toward goal  PT Treatment Precautions/Restrictions  Precautions Precautions: Fall Required Braces or Orthoses: No Restrictions Weight Bearing Restrictions: No RLE Weight Bearing: Weight bearing as tolerated Other Position/Activity Restrictions: Pt instructed on WBAT Mobility (including Balance) Bed Mobility Bed Mobility: Yes Supine to Sit: 1: +1 Total assist Supine to Sit Details (indicate cue type and reason): Total Assist + 1 pt 75% with HOB elevated 45' and increased time.  Total Assist to scoot hips to EOB using pad to pull pt around. Transfers Transfers: Yes Sit to Stand: 1: +2 Total assist;From toilet;From bed Sit to Stand Details (indicate cue type and reason): Total Assist + 2 pt 45% with 75% VC's on hand placement  and increased time Stand to Sit: To chair/3-in-1;To toilet;1: +2 Total assist Stand to Sit Details: Total Assist + 2 pt 50% with 75% VC's on hand placement and increased time Ambulation/Gait Ambulation/Gait: Yes Ambulation/Gait Assistance: 1: +2 Total assist Ambulation/Gait Assistance Details (indicate cue type and reason): Total Assist + 2 pt 45% with increased ability to advance R LE on his own 4/5 times.  Pt c/o increased weakness today, feeling "poorly".  100% VC't to lock R knee and increase WB thru R LE inorder to advance L LE. Ambulation Distance (Feet): 5 Feet Assistive device: Eva walker Gait Pattern: Step-to pattern;Decreased stance time - right;Trunk flexed Gait velocity: Pt c/o 5/10 R hip pain with amb, ICE pack applied and repositioned. Stairs: No Wheelchair Mobility Wheelchair Mobility: No   Assisted pt on/off BSC  Exercise    End of Session PT - End of Session Equipment Utilized During Treatment: Gait belt Activity Tolerance: Patient limited by fatigue Patient left: in chair;with call bell in reach Nurse Communication: Need for lift equipment;Other (comment) (instructed student nurse to have staff use lift to get pt ba) General Behavior During Session: [facility] for tasks performed Cognition: [facility] for tasks performed  Felecia Shelling  PTA [facility]  Acute  Rehab Pager     [phone]

## 2011-07-01 NOTE — Progress Notes (Signed)
Pt has bed at [facility]. Awaiting auth from pt's insurance for SNF.  Dellie Burns, MSW, [place] [phone] (coverage)

## 2011-07-02 ENCOUNTER — Inpatient Hospital Stay (HOSPITAL_COMMUNITY): Payer: Medicare HMO

## 2011-07-02 LAB — CBC
HCT: 34.7 % — ABNORMAL LOW (ref 39.0–52.0)
HCT: 32.2 % — ABNORMAL LOW (ref 39.0–52.0)
Hemoglobin: 10.3 g/dL — ABNORMAL LOW (ref 13.0–17.0)
MCH: 27.2 pg (ref 26.0–34.0)
MCHC: 31.1 g/dL (ref 30.0–36.0)
MCHC: 32 g/dL (ref 30.0–36.0)
MCV: 85.9 fL (ref 78.0–100.0)
MCV: 85 fL (ref 78.0–100.0)
Platelets: 306 10*3/uL (ref 150–400)
RBC: 3.79 MIL/uL — ABNORMAL LOW (ref 4.22–5.81)
RDW: 16.3 % — ABNORMAL HIGH (ref 11.5–15.5)
RDW: 16.1 % — ABNORMAL HIGH (ref 11.5–15.5)
WBC: 11.4 10*3/uL — ABNORMAL HIGH (ref 4.0–10.5)

## 2011-07-02 LAB — BASIC METABOLIC PANEL
BUN: 36 mg/dL — ABNORMAL HIGH (ref 6–23)
Chloride: 103 mEq/L (ref 96–112)
Creatinine, Ser: 2.1 mg/dL — ABNORMAL HIGH (ref 0.50–1.35)
GFR calc Af Amer: 34 mL/min — ABNORMAL LOW (ref >90)

## 2011-07-02 LAB — PROTIME-INR: Prothrombin Time: 33.7 seconds — ABNORMAL HIGH (ref 11.6–15.2)

## 2011-07-02 NOTE — Progress Notes (Signed)
CSW assisting with d/c planning. [facility] has received prior approval for ST SNF placement from [facility]. Informed by nsg pt is not stable for d/c today. CSW will assist with d/c planning to SNF [facility] if pt is stable for d/c. Pt/family updated regarding SNF placement.

## 2011-07-02 NOTE — Progress Notes (Signed)
Physical Therapy Treatment Patient Details Name: [name] MRN: [id number] DOB: [birth year] Today's Date: 07/02/2011  R ORIF 2nd fall/fx  WBAT  POD #6 13:35 - 13:55 1 gt  PT Assessment/Plan  PT - Assessment/Plan Comments on Treatment Session: Pt c/o that he was left in chair too long "six hours" and  that they promised to come and "never did".  Pt agreed to get OOB in recliner for only two hours today.  Reported to RN to assist pt back to bed using lift around 4pm.  Also, noted a large yellowish red stain approx 11in in diameter on bed pad when assist pt up.  Reported to RN. PT Plan: Discharge plan remains appropriate Follow Up Recommendations: Skilled nursing facility Equipment Recommended: Defer to next venue PT Goals  Acute Rehab PT Goals PT Goal Formulation: With patient Pt will go Supine/Side to Sit: with min assist PT Goal: Supine/Side to Sit - Progress: Progressing toward goal Pt will go Sit to Stand: with min assist PT Goal: Sit to Stand - Progress: Progressing toward goal Pt will go Stand to Sit: with min assist PT Goal: Stand to Sit - Progress: Progressing toward goal Pt will Ambulate: 16 - 50 feet;with min assist;with rolling walker PT Goal: Ambulate - Progress: Progressing toward goal Pt will Perform Home Exercise Program: with supervision, verbal cues required/provided PT Goal: Perform Home Exercise Program - Progress: Progressing toward goal  PT Treatment Precautions/Restrictions  Precautions Precautions: Fall Required Braces or Orthoses: No Restrictions Weight Bearing Restrictions: No RLE Weight Bearing: Weight bearing as tolerated Other Position/Activity Restrictions: Pt aware he is WBAT Mobility (including Balance) Bed Mobility Bed Mobility: Yes Supine to Sit: 1: +2 Total assist Supine to Sit Details (indicate cue type and reason): Total Assist + 2 pt 50% greatest difficulty with supine to sit 2nd disdended Abd Transfers Transfers: Yes Sit to Stand: 1:  +2 Total assist;From elevated surface;From bed Sit to Stand Details (indicate cue type and reason): Total asssit + 2 pt 50% with 50% VC's on hand placement and increased time Stand to Sit: 1: +2 Total assist;To chair/3-in-1 Stand to Sit Details: Total Assist + 2 pt 75% with increased time and 25% VC's on hand placement to control decend Ambulation/Gait Ambulation/Gait: Yes Ambulation/Gait Assistance: 1: +2 Total assist Ambulation/Gait Assistance Details (indicate cue type and reason): Total Assist + 2 pt 55% with eva walker and increased time.  Pt better able to weight shift and advance R LE 75% of time w/o assist Ambulation Distance (Feet): 12 Feet Assistive device: Eva walker Gait Pattern: Step-to pattern;Trunk flexed;Decreased stance time - right;Decreased step length - left;Decreased step length - right;Shuffle Gait velocity: B flex hips and knees, 75% VC's to extend and WB.  Pt c/o no hip pain, just max fatigue. Stairs: No Corporate treasurer: No    Exercise    End of Session PT - End of Session Equipment Utilized During Treatment: Gait belt Activity Tolerance: Patient limited by fatigue Patient left: in chair;with call bell in reach Nurse Communication: Need for lift equipment (RN notified to use lift to assist pt back to bed) General Behavior During Session: [facility] for tasks performed Cognition: [facility] for tasks performed  Felecia Shelling  PTA [facility]  Acute  Rehab Pager     [phone]

## 2011-07-02 NOTE — Progress Notes (Signed)
ANTICOAGULATION CONSULT NOTE - Follow Up Consult  Pharmacy Consult for Warfarin Indication: h/o pulmonary embolus now s/p ORIF 2/22  No Known Allergies  Patient Measurements: Height: 5\' 6"  (167.6 cm) Weight: 134 lb (60.782 kg) IBW/kg (Calculated) : 63.8    Vital Signs: Temp: 98.6 F (37 C) (02/28 0605) Temp src: Oral (02/28 0605) BP: 139/83 mmHg (02/28 0605) Pulse Rate: 102  (02/28 0605)  Labs:  Basename 07/02/11 0429 07/01/11 0344 06/30/11 0357  HGB -- 12.1* --  HCT -- 37.3* --  PLT -- 313 --  APTT -- -- --  LABPROT 33.7* 24.1* 25.3*  INR 3.26* 2.12* 2.26*  HEPARINUNFRC -- -- --  CREATININE 2.10* 1.31 1.09  CKTOTAL -- -- --  CKMB -- -- --  TROPONINI -- -- --   Estimated Creatinine Clearance: 26.1 ml/min (by C-G formula based on Cr of 2.1).  Assessment:  78 y/o M  w/ h/o PE on chronic Coumadin PTA  Admitted 2/20 after fall and hip fx while here visiting spouse. INR was supratx 2/20 (3.22)   Underwent ORIF of R hip on 2/22 after INR down (1.77)  Home Coumadin dose = 2.5 mg MWF,Sunday and 5mg  other days.   INR up sharply today.  MD reports small amount of blood per rectum, believes likely due to constipation.  Goal of Therapy:  INR 2-3   Plan:   No warfarin today.  Daily PT/INR   Elie Goody, PharmD, BCPS Pager: [phone] 07/02/2011  7:47 AM

## 2011-07-02 NOTE — Progress Notes (Signed)
Subjective: Patient still having trouble with some abdominal discomfort, constipation. Nurse noted some blood on pad this a.m., rectal exam revealed some perirectal smearing of blood, DRE not perform. Patient has been having a lot of trouble with constipation and straining to have a bowel movement. There's been no problems with GI bleed in the past, he was on Coumadin for  months without problem prior to this hospitalization.  Objective: Vital signs in last 24 hours: Temp:  [97.9 F (36.6 C)-98.6 F (37 C)] 98.6 F (37 C) (02/28 0605) Pulse Rate:  [92-113] 102  (02/28 0605) Resp:  [18-20] 18  (02/28 0757) BP: (128-142)/(71-83) 139/83 mmHg (02/28 0605) SpO2:  [93 %-95 %] 95 % (02/28 0605) Weight change:  Last BM Date: 06/30/11  Intake/Output from previous day: 02/27 0701 - 02/28 0700 In: 1040 [P.O.:1040] Out: 400 [Urine:400] Intake/Output this shift: Total I/O In: -  Out: 100 [Urine:100]  General appearance: alert and cooperative Resp: clear to auscultation bilaterally GI: soft, non-tender; bowel sounds normal; no masses,  no organomegaly Extremities: no edema perirectal smearing of blood, somewhat maroon in color  Lab Results:  Results for orders placed during the hospital encounter of 06/24/11 (from the past 24 hour(s))  PROTIME-INR     Status: Abnormal   Collection Time   07/02/11  4:29 AM      Component Value Range   Prothrombin Time 33.7 (*) 11.6 - 15.2 (seconds)   INR 3.26 (*) 0.00 - 1.49   BASIC METABOLIC PANEL     Status: Abnormal   Collection Time   07/02/11  4:29 AM      Component Value Range   Sodium 139  135 - 145 (mEq/L)   Potassium 4.1  3.5 - 5.1 (mEq/L)   Chloride 103  96 - 112 (mEq/L)   CO2 28  19 - 32 (mEq/L)   Glucose, Bld 135 (*) 70 - 99 (mg/dL)   BUN 36 (*) 6 - 23 (mg/dL)   Creatinine, Ser 1.19 (*) 0.50 - 1.35 (mg/dL)   Calcium 8.6  8.4 - 14.7 (mg/dL)   GFR calc non Af Amer 29 (*) >90 (mL/min)   GFR calc Af Amer 34 (*) >90 (mL/min)  CBC      Status: Abnormal   Collection Time   07/02/11  4:29 AM      Component Value Range   WBC 10.0  4.0 - 10.5 (K/uL)   RBC 4.04 (*) 4.22 - 5.81 (MIL/uL)   Hemoglobin 10.8 (*) 13.0 - 17.0 (g/dL)   HCT 82.9 (*) 56.2 - 52.0 (%)   MCV 85.9  78.0 - 100.0 (fL)   MCH 26.7  26.0 - 34.0 (pg)   MCHC 31.1  30.0 - 36.0 (g/dL)   RDW 13.0 (*) 86.5 - 15.5 (%)   Platelets 287  150 - 400 (K/uL)      Studies/Results: Dg Abd Portable 1v  07/02/2011  Reason for examination:  abdominal pain and distention. Constipation.  ABDOMINAL RADIOGRAPH ONE VIEW PORTABLE SUPINE  Comparison:  Radiographs dated 06/30/2011  Findings:  There is moderate stool in the colon with increased air in the transverse colon.  There is no evidence of obstruction.  No dilated small bowel.  Moderate rotoscoliosis of the lumbar spine.  The stents in the right iliac artery.  Evidence of abdominal wall mesh in place.  Impression:  Increased air in the transverse portion of the colon.  No change in the moderate stool in the colon.  No obstruction.  Original Report Authenticated  By: Gwynn Burly, M.D.    Medications:  Prior to Admission:  Prescriptions prior to admission  Medication Sig Dispense Refill  . clopidogrel (PLAVIX) 75 MG tablet Take 75 mg by mouth daily.      Marland Kitchen diltiazem (DILACOR XR) 180 MG 24 hr capsule Take 180 mg by mouth daily.      . metoprolol (LOPRESSOR) 100 MG tablet Take 100 mg by mouth 2 (two) times daily.      . pantoprazole (PROTONIX) 40 MG tablet Take 40 mg by mouth daily.      . simvastatin (ZOCOR) 20 MG tablet Take 10 mg by mouth every evening. Pt cuts tab in half for 10 mg dose      . warfarin (COUMADIN) 5 MG tablet Take 2.5-5 mg by mouth See admin instructions. Pt takes 1/2 of 5 mg  Tablet for 2.5 mg dose on Monday,Wednesday,Friday,sunday. On Tuesday,Thursday, Saturday pt takes 5 mg.       Scheduled:   . diltiazem  180 mg Oral Daily  . docusate sodium  100 mg Oral BID  . ferrous fumarate  1 tablet Oral BID    . metoprolol tartrate  100 mg Oral BID  . pantoprazole  40 mg Oral Q1200  . polyethylene glycol  17 g Oral Daily  . simvastatin  10 mg Oral q1800  . warfarin  2.5 mg Oral ONCE-1800  . Warfarin - Pharmacist Dosing Inpatient   Does not apply q1800  . DISCONTD: warfarin  5 mg Oral ONCE-1800   Continuous:   Assessment/Plan: Blood per rectum, probably from constipation. We will try to limit narcotics, hold iron at this time, check followup hemoglobin, mobilize as much is possible. Abdominal film shows increased air in the transverse colon and significant stool. History of PE on Coumadin, are not elevated, I discussed with pharmacy to hold Coumadin at this time until we are sure no further leading Hypertension Asthma Status post repair of right hip fracture  LOS: 8 days   Kerby Hockley D 07/02/2011, 9:50 AM

## 2011-07-02 NOTE — Progress Notes (Signed)
Noted medium diluted blood stain to bed pad, appears to come from rectum, wiped few small blood clots from anus area. Then Patient attempts to have bowel movement, no BM passed at this time, noted small amount of dark red bloody liquid to bedpan per nurse tech. Pt denies any history of bleeding per rectum, denies any abdominal pain or feeling any changes in condition at this time.VSS. Instructed patient to notify if any changes, and instructed nurse tech to inform RN if any further blood or bloody BM noted to let RN assess. To continue to moniter.

## 2011-07-02 NOTE — Progress Notes (Signed)
Patient IV acces outdated, IV started 2/22, Per report patient not using IV access for meds or IVF, patient tolerating POs well, awaiting SNF placement. MD on call notified, orders received and initiated.

## 2011-07-03 ENCOUNTER — Encounter (HOSPITAL_COMMUNITY)
Admission: EM | Disposition: A | Discharge status: Skilled Nursing Facility | Payer: Self-pay | Source: Home / Self Care | Attending: Internal Medicine

## 2011-07-03 ENCOUNTER — Inpatient Hospital Stay (HOSPITAL_COMMUNITY): Payer: Medicare HMO

## 2011-07-03 ENCOUNTER — Encounter (HOSPITAL_COMMUNITY): Payer: Self-pay | Admitting: *Deleted

## 2011-07-03 HISTORY — PX: ESOPHAGOGASTRODUODENOSCOPY: SHX5428

## 2011-07-03 HISTORY — PX: SMALL INTESTINE SURGERY: SHX150

## 2011-07-03 LAB — CBC
HCT: 31.3 % — ABNORMAL LOW (ref 39.0–52.0)
HCT: 21.3 % — ABNORMAL LOW (ref 39.0–52.0)
Hemoglobin: 9.9 g/dL — ABNORMAL LOW (ref 13.0–17.0)
MCV: 85.3 fL (ref 78.0–100.0)
MCV: 85.5 fL (ref 78.0–100.0)
Platelets: 368 10*3/uL (ref 150–400)
RBC: 3.67 MIL/uL — ABNORMAL LOW (ref 4.22–5.81)
RBC: 2.49 MIL/uL — ABNORMAL LOW (ref 4.22–5.81)
WBC: 13.9 10*3/uL — ABNORMAL HIGH (ref 4.0–10.5)
WBC: 9.3 10*3/uL (ref 4.0–10.5)

## 2011-07-03 LAB — BASIC METABOLIC PANEL
BUN: 46 mg/dL — ABNORMAL HIGH (ref 6–23)
GFR calc Af Amer: 32 mL/min — ABNORMAL LOW (ref >90)
GFR calc non Af Amer: 28 mL/min — ABNORMAL LOW (ref >90)

## 2011-07-03 LAB — PROTIME-INR
INR: 3.44 — ABNORMAL HIGH (ref 0.00–1.49)
INR: 1.42 (ref 0.00–1.49)
Prothrombin Time: 17.6 seconds — ABNORMAL HIGH (ref 11.6–15.2)

## 2011-07-03 SURGERY — EGD (ESOPHAGOGASTRODUODENOSCOPY)
Anesthesia: Moderate Sedation

## 2011-07-03 MED ORDER — SODIUM CHLORIDE 0.9 % IV SOLN
INTRAVENOUS | Status: DC
  Administered 2011-07-03: 1000 mL via INTRAVENOUS
  Administered 2011-07-04: 50 mL/h via INTRAVENOUS

## 2011-07-03 MED ORDER — MIDAZOLAM HCL 10 MG/2ML IJ SOLN
INTRAMUSCULAR | Status: DC | PRN
  Administered 2011-07-03: 1 mg via INTRAVENOUS
  Administered 2011-07-03: 2 mg via INTRAVENOUS

## 2011-07-03 MED ORDER — FENTANYL NICU IV SYRINGE 50 MCG/ML
INJECTION | INTRAMUSCULAR | Status: DC | PRN
  Administered 2011-07-03: 12.5 ug via INTRAVENOUS
  Administered 2011-07-03: 25 ug via INTRAVENOUS

## 2011-07-03 MED ORDER — METOPROLOL TARTRATE 1 MG/ML IV SOLN
5.0000 mg | Freq: Four times a day (QID) | INTRAVENOUS | Status: DC
  Administered 2011-07-03 – 2011-07-04 (×5): 5 mg via INTRAVENOUS
  Filled 2011-07-03 (×9): qty 5

## 2011-07-03 MED ORDER — FENTANYL CITRATE 0.05 MG/ML IJ SOLN
INTRAMUSCULAR | Status: AC
  Filled 2011-07-03: qty 2

## 2011-07-03 MED ORDER — MORPHINE SULFATE 2 MG/ML IJ SOLN
INTRAMUSCULAR | Status: AC
  Filled 2011-07-03: qty 1

## 2011-07-03 MED ORDER — SODIUM CHLORIDE 0.9 % IV SOLN: INTRAVENOUS | Status: DC

## 2011-07-03 MED ORDER — MORPHINE SULFATE 2 MG/ML IJ SOLN
2.0000 mg | INTRAMUSCULAR | Status: DC | PRN
  Administered 2011-07-03 – 2011-07-04 (×3): 2 mg via INTRAVENOUS
  Filled 2011-07-03 (×3): qty 1

## 2011-07-03 MED ORDER — BUTAMBEN-TETRACAINE-BENZOCAINE 2-2-14 % EX AERO
INHALATION_SPRAY | CUTANEOUS | Status: DC | PRN
  Administered 2011-07-03: 2 via TOPICAL

## 2011-07-03 MED ORDER — MIDAZOLAM HCL 10 MG/2ML IJ SOLN
INTRAMUSCULAR | Status: AC
  Filled 2011-07-03: qty 2

## 2011-07-03 MED ORDER — VITAMIN K1 10 MG/ML IJ SOLN
10.0000 mg | Freq: Once | INTRAVENOUS | Status: AC
  Administered 2011-07-03: 10 mg via INTRAVENOUS
  Filled 2011-07-03: qty 1

## 2011-07-03 MED ORDER — MORPHINE SULFATE 2 MG/ML IJ SOLN
1.0000 mg | INTRAMUSCULAR | Status: DC | PRN
  Administered 2011-07-03 (×2): 1 mg via INTRAVENOUS
  Filled 2011-07-03: qty 1

## 2011-07-03 MED ORDER — SODIUM CHLORIDE 0.9 % IV SOLN
8.0000 mg/h | INTRAVENOUS | Status: DC
  Administered 2011-07-03 – 2011-07-04 (×2): 8 mg/h via INTRAVENOUS
  Filled 2011-07-03 (×8): qty 80

## 2011-07-03 MED ORDER — WARFARIN - PHARMACIST DOSING INPATIENT
Freq: Every day | Status: DC
  Filled 2011-07-03 (×2): qty 1

## 2011-07-03 NOTE — Progress Notes (Signed)
Pain medication held this morning due to patient being made NPO for GI bleed. This RN made Dr. Nehemiah Settle aware that patient was in 5/10 abdominal pain and pain medication was held pending his approval. MD was also made aware of patient expelling significant clots and rectal bleeding. FFPs are currently infusing and patient has been seen by the MD.

## 2011-07-03 NOTE — Progress Notes (Signed)
Subjective: Events noted past 24-hour blood per rectum with clots, patient transferred to ICU. Abdomen still somewhat distended, X-ray showing increased fecal burrden and distended transverse colon. Patient without nausea vomiting or hematemesis, no history of upper GI bleed no history of end-stage use. However he did have a bump in his BUN and creatinine. GI has been notify, they will see patient today  Objective: Vital signs in last 24 hours: Temp:  [97.6 F (36.4 C)-98 F (36.7 C)] 97.6 F (36.4 C) (03/01 0700) Pulse Rate:  [91-117] 117  (03/01 0800) Resp:  [16-23] 23  (03/01 0800) BP: (107-142)/(70-85) 121/80 mmHg (03/01 0800) SpO2:  [92 %-100 %] 100 % (03/01 0800) Weight:  [67.5 kg (148 lb 13 oz)] 67.5 kg (148 lb 13 oz) (03/01 0420) Weight change:  Last BM Date: 07/02/11 (blood per rectum)  Intake/Output from previous day: 02/28 0701 - 03/01 0700 In: 772.5 [P.O.:460; I.V.:300; Blood:12.5] Out: 810 [Urine:810] Intake/Output this shift:    General appearance: alert and cooperative Resp: clear to auscultation bilaterally Cardio: regular rate and rhythm, S1, S2 normal, no murmur, click, rub or gallop GI: slightly distended, tympanic  Lab Results:  Results for orders placed during the hospital encounter of 06/24/11 (from the past 24 hour(s))  CBC     Status: Abnormal   Collection Time   07/02/11  7:20 PM      Component Value Range   WBC 11.4 (*) 4.0 - 10.5 (K/uL)   RBC 3.79 (*) 4.22 - 5.81 (MIL/uL)   Hemoglobin 10.3 (*) 13.0 - 17.0 (g/dL)   HCT 16.1 (*) 09.6 - 52.0 (%)   MCV 85.0  78.0 - 100.0 (fL)   MCH 27.2  26.0 - 34.0 (pg)   MCHC 32.0  30.0 - 36.0 (g/dL)   RDW 04.5 (*) 40.9 - 15.5 (%)   Platelets 306  150 - 400 (K/uL)  PREPARE FRESH FROZEN PLASMA     Status: Normal (Preliminary result)   Collection Time   07/03/11  2:51 AM      Component Value Range   Unit Number [id number]     Blood Component Type THAWED PLASMA     Unit division 00     Status of Unit ALLOCATED       Transfusion Status OK TO TRANSFUSE     Unit Number [id number]     Blood Component Type THAWED PLASMA     Unit division 00     Status of Unit ISSUED     Transfusion Status OK TO TRANSFUSE    PREPARE RBC (CROSSMATCH)     Status: Normal   Collection Time   07/03/11  2:52 AM      Component Value Range   Order Confirmation ORDER PROCESSED BY BLOOD BANK    PROTIME-INR     Status: Abnormal   Collection Time   07/03/11  3:24 AM      Component Value Range   Prothrombin Time 35.2 (*) 11.6 - 15.2 (seconds)   INR 3.44 (*) 0.00 - 1.49   BASIC METABOLIC PANEL     Status: Abnormal   Collection Time   07/03/11  3:24 AM      Component Value Range   Sodium 136  135 - 145 (mEq/L)   Potassium 4.4  3.5 - 5.1 (mEq/L)   Chloride 103  96 - 112 (mEq/L)   CO2 24  19 - 32 (mEq/L)   Glucose, Bld 167 (*) 70 - 99 (mg/dL)   BUN 46 (*) 6 - 23 (  mg/dL)   Creatinine, Ser 4.54 (*) 0.50 - 1.35 (mg/dL)   Calcium 8.2 (*) 8.4 - 10.5 (mg/dL)   GFR calc non Af Amer 28 (*) >90 (mL/min)   GFR calc Af Amer 32 (*) >90 (mL/min)  CBC     Status: Abnormal   Collection Time   07/03/11  3:24 AM      Component Value Range   WBC 13.9 (*) 4.0 - 10.5 (K/uL)   RBC 3.67 (*) 4.22 - 5.81 (MIL/uL)   Hemoglobin 9.9 (*) 13.0 - 17.0 (g/dL)   HCT 09.8 (*) 11.9 - 52.0 (%)   MCV 85.3  78.0 - 100.0 (fL)   MCH 27.0  26.0 - 34.0 (pg)   MCHC 31.6  30.0 - 36.0 (g/dL)   RDW 14.7 (*) 82.9 - 15.5 (%)   Platelets 368  150 - 400 (K/uL)  TYPE AND SCREEN     Status: Normal (Preliminary result)   Collection Time   07/03/11  3:24 AM      Component Value Range   ABO/RH(D) A POS     Antibody Screen NEG     Sample Expiration 07/06/2011     Unit Number [id number]     Blood Component Type RED CELLS,LR     Unit division 00     Status of Unit ALLOCATED     Transfusion Status OK TO TRANSFUSE     Crossmatch Result Compatible     Unit Number [id number]     Blood Component Type RED CELLS,LR     Unit division 00     Status of Unit ALLOCATED     Transfusion  Status OK TO TRANSFUSE     Crossmatch Result Compatible    MRSA PCR SCREENING     Status: Normal   Collection Time   07/03/11  4:21 AM      Component Value Range   MRSA by PCR NEGATIVE  NEGATIVE       Studies/Results: Dg Abd 1 View  07/03/2011  *RADIOLOGY REPORT*  Clinical Data: Follow up of small bowel obstruction.  Blood in stool.  Abdominal distention and swelling.  ABDOMEN - 1 VIEW  Comparison: 1 day prior  Findings: Right-sided proximal femoral fixation.  No definite free intraperitoneal air.  There is lucency along the right hemi diaphragm which could be artifactual on the first image. Borderline gaseous distention of the colon is not significantly changed.  This primarily affects the transverse segment. Descending colon is normal in caliber.  There is a moderate amount of rectal stool which is similar.  Right iliac vascular stent.  No significant small bowel dilatation.  No pneumatosis.  IMPRESSION: Similar moderate rectal stool, suggesting a component of fecal impaction.  Borderline colonic dilatation without evidence of obstruction.  Apparent lucency along the right hemi diaphragm, possibly artifactual on this supine film.  If there is a clinical concern of bowel perforation/free intraperitoneal air, consider upright and decubitus views.  Original Report Authenticated By: Consuello Bossier, M.D.   Dg Abd Portable 1v  07/02/2011  Reason for examination:  abdominal pain and distention. Constipation.  ABDOMINAL RADIOGRAPH ONE VIEW PORTABLE SUPINE  Comparison:  Radiographs dated 06/30/2011  Findings:  There is moderate stool in the colon with increased air in the transverse colon.  There is no evidence of obstruction.  No dilated small bowel.  Moderate rotoscoliosis of the lumbar spine.  The stents in the right iliac artery.  Evidence of abdominal wall mesh in place.  Impression:  Increased air  in the transverse portion of the colon.  No change in the moderate stool in the colon.  No obstruction.   Original Report Authenticated By: Gwynn Burly, M.D.    Medications:  Prior to Admission:  Prescriptions prior to admission  Medication Sig Dispense Refill  . clopidogrel (PLAVIX) 75 MG tablet Take 75 mg by mouth daily.      Marland Kitchen diltiazem (DILACOR XR) 180 MG 24 hr capsule Take 180 mg by mouth daily.      . metoprolol (LOPRESSOR) 100 MG tablet Take 100 mg by mouth 2 (two) times daily.      . pantoprazole (PROTONIX) 40 MG tablet Take 40 mg by mouth daily.      . simvastatin (ZOCOR) 20 MG tablet Take 10 mg by mouth every evening. Pt cuts tab in half for 10 mg dose      . warfarin (COUMADIN) 5 MG tablet Take 2.5-5 mg by mouth See admin instructions. Pt takes 1/2 of 5 mg  Tablet for 2.5 mg dose on Monday,Wednesday,Friday,sunday. On Tuesday,Thursday, Saturday pt takes 5 mg.       Scheduled:   . diltiazem  180 mg Oral Daily  . docusate sodium  100 mg Oral BID  . metoprolol tartrate  100 mg Oral BID  . phytonadione (VITAMIN K) IV  10 mg Intravenous Once  . simvastatin  10 mg Oral q1800  . Warfarin - Pharmacist Dosing Inpatient   Does not apply q1800  . DISCONTD: ferrous fumarate  1 tablet Oral BID  . DISCONTD: pantoprazole  40 mg Oral Q1200  . DISCONTD: polyethylene glycol  17 g Oral Daily  . DISCONTD: Warfarin - Pharmacist Dosing Inpatient   Does not apply q1800   Continuous:   . sodium chloride    . pantoprozole (PROTONIX) infusion 8 mg/hr (07/03/11 0415)    Assessment/Plan: GI bleed, check serial H&H, keep hemoglobin greater than 9, GI physician has been notified, Of note last colonoscopy showed diverticulosis, Perform 2011 History of PE, tolerated therapeutic Coumadin over the last 5-6 months without any bleeding. Status post repair of hip fracture stable Hypertension Asthma Constipation History of chronic kidney disease, recent jump in creatinine over the last 2 days question Whether secondary to GI bleed versus poor p.o. Intake continue gentle hydration.  LOS: 9 days    Seydina Holliman D 07/03/2011, 8:42 AM

## 2011-07-03 NOTE — Interval H&P Note (Signed)
History and Physical Interval Note:  07/03/2011 2:26 PM  [name]  has presented today for surgery, with the diagnosis of gi bleed  The various methods of treatment have been discussed with the patient and family. After consideration of risks, benefits and other options for treatment, the patient has consented to  Procedure(s) (LRB): ESOPHAGOGASTRODUODENOSCOPY (EGD) (N/A) as a surgical intervention .  The patients' history has been reviewed, patient examined, no change in status, stable for surgery.  I have reviewed the patients' chart and labs.  Questions were answered to the patient's satisfaction.     Vikash Nest C.

## 2011-07-03 NOTE — Progress Notes (Signed)
Patient continues to have increasing amounts of blood and clots from rectum, with steady flow of red blood from rectum with some clots noted throughout this AM. VSS. Patient c/o feeling like sweating, c/o abd pain 10/10 that improves with pain medicine, patient forehead noted to be clammy. AC notified, MD on call notified. Orders received and initiated. Patient transferred to stepdown in stable condition. Patient's son aware of transfer to stepdown.

## 2011-07-03 NOTE — Progress Notes (Signed)
PT Cancellation Note  Treatment cancelled today due to medical issues with patient which prohibited therapy.  Patient just s/p second unit FFP, and awaiting PRBC's.  Will have endoscopy this pm.  [name],CYNDI 07/03/2011, 12:47 PM

## 2011-07-03 NOTE — H&P (View-Only) (Signed)
Subjective: Events noted past 24-hour blood per rectum with clots, patient transferred to ICU. Abdomen still somewhat distended, X-ray showing increased fecal burrden and distended transverse colon. Patient without nausea vomiting or hematemesis, no history of upper GI bleed no history of end-stage use. However he did have a bump in his BUN and creatinine. GI has been notify, they will see patient today  Objective: Vital signs in last 24 hours: Temp:  [97.6 F (36.4 C)-98 F (36.7 C)] 97.6 F (36.4 C) (03/01 0700) Pulse Rate:  [91-117] 117  (03/01 0800) Resp:  [16-23] 23  (03/01 0800) BP: (107-142)/(70-85) 121/80 mmHg (03/01 0800) SpO2:  [92 %-100 %] 100 % (03/01 0800) Weight:  [67.5 kg (148 lb 13 oz)] 67.5 kg (148 lb 13 oz) (03/01 0420) Weight change:  Last BM Date: 07/02/11 (blood per rectum)  Intake/Output from previous day: 02/28 0701 - 03/01 0700 In: 772.5 [P.O.:460; I.V.:300; Blood:12.5] Out: 810 [Urine:810] Intake/Output this shift:    General appearance: alert and cooperative Resp: clear to auscultation bilaterally Cardio: regular rate and rhythm, S1, S2 normal, no murmur, click, rub or gallop GI: slightly distended, tympanic  Lab Results:  Results for orders placed during the hospital encounter of 06/24/11 (from the past 24 hour(s))  CBC     Status: Abnormal   Collection Time   07/02/11  7:20 PM      Component Value Range   WBC 11.4 (*) 4.0 - 10.5 (K/uL)   RBC 3.79 (*) 4.22 - 5.81 (MIL/uL)   Hemoglobin 10.3 (*) 13.0 - 17.0 (g/dL)   HCT 32.2 (*) 39.0 - 52.0 (%)   MCV 85.0  78.0 - 100.0 (fL)   MCH 27.2  26.0 - 34.0 (pg)   MCHC 32.0  30.0 - 36.0 (g/dL)   RDW 16.1 (*) 11.5 - 15.5 (%)   Platelets 306  150 - 400 (K/uL)  PREPARE FRESH FROZEN PLASMA     Status: Normal (Preliminary result)   Collection Time   07/03/11  2:51 AM      Component Value Range   Unit Number [id number]     Blood Component Type THAWED PLASMA     Unit division 00     Status of Unit ALLOCATED       Transfusion Status OK TO TRANSFUSE     Unit Number [id number]     Blood Component Type THAWED PLASMA     Unit division 00     Status of Unit ISSUED     Transfusion Status OK TO TRANSFUSE    PREPARE RBC (CROSSMATCH)     Status: Normal   Collection Time   07/03/11  2:52 AM      Component Value Range   Order Confirmation ORDER PROCESSED BY BLOOD BANK    PROTIME-INR     Status: Abnormal   Collection Time   07/03/11  3:24 AM      Component Value Range   Prothrombin Time 35.2 (*) 11.6 - 15.2 (seconds)   INR 3.44 (*) 0.00 - 1.49   BASIC METABOLIC PANEL     Status: Abnormal   Collection Time   07/03/11  3:24 AM      Component Value Range   Sodium 136  135 - 145 (mEq/L)   Potassium 4.4  3.5 - 5.1 (mEq/L)   Chloride 103  96 - 112 (mEq/L)   CO2 24  19 - 32 (mEq/L)   Glucose, Bld 167 (*) 70 - 99 (mg/dL)   BUN 46 (*) 6 - 23 (  mg/dL)   Creatinine, Ser 2.20 (*) 0.50 - 1.35 (mg/dL)   Calcium 8.2 (*) 8.4 - 10.5 (mg/dL)   GFR calc non Af Amer 28 (*) >90 (mL/min)   GFR calc Af Amer 32 (*) >90 (mL/min)  CBC     Status: Abnormal   Collection Time   07/03/11  3:24 AM      Component Value Range   WBC 13.9 (*) 4.0 - 10.5 (K/uL)   RBC 3.67 (*) 4.22 - 5.81 (MIL/uL)   Hemoglobin 9.9 (*) 13.0 - 17.0 (g/dL)   HCT 31.3 (*) 39.0 - 52.0 (%)   MCV 85.3  78.0 - 100.0 (fL)   MCH 27.0  26.0 - 34.0 (pg)   MCHC 31.6  30.0 - 36.0 (g/dL)   RDW 15.9 (*) 11.5 - 15.5 (%)   Platelets 368  150 - 400 (K/uL)  TYPE AND SCREEN     Status: Normal (Preliminary result)   Collection Time   07/03/11  3:24 AM      Component Value Range   ABO/RH([name]) A POS     Antibody Screen NEG     Sample Expiration 07/06/2011     Unit Number [id number]     Blood Component Type RED CELLS,LR     Unit division 00     Status of Unit ALLOCATED     Transfusion Status OK TO TRANSFUSE     Crossmatch Result Compatible     Unit Number [id number]     Blood Component Type RED CELLS,LR     Unit division 00     Status of Unit ALLOCATED     Transfusion  Status OK TO TRANSFUSE     Crossmatch Result Compatible    MRSA PCR SCREENING     Status: Normal   Collection Time   07/03/11  4:21 AM      Component Value Range   MRSA by PCR NEGATIVE  NEGATIVE       Studies/Results: Dg Abd 1 View  07/03/2011  *RADIOLOGY REPORT*  Clinical Data: Follow up of small bowel obstruction.  Blood in stool.  Abdominal distention and swelling.  ABDOMEN - 1 VIEW  Comparison: 1 day prior  Findings: Right-sided proximal femoral fixation.  No definite free intraperitoneal air.  There is lucency along the right hemi diaphragm which could be artifactual on the first image. Borderline gaseous distention of the colon is not significantly changed.  This primarily affects the transverse segment. Descending colon is normal in caliber.  There is a moderate amount of rectal stool which is similar.  Right iliac vascular stent.  No significant small bowel dilatation.  No pneumatosis.  IMPRESSION: Similar moderate rectal stool, suggesting a component of fecal impaction.  Borderline colonic dilatation without evidence of obstruction.  Apparent lucency along the right hemi diaphragm, possibly artifactual on this supine film.  If there is a clinical concern of bowel perforation/free intraperitoneal air, consider upright and decubitus views.  Original Report Authenticated By: KYLE [name]. TALBOT, M.[name].   Dg Abd Portable 1v  07/02/2011  Reason for examination:  abdominal pain and distention. Constipation.  ABDOMINAL RADIOGRAPH ONE VIEW PORTABLE SUPINE  Comparison:  Radiographs dated 06/30/2011  Findings:  There is moderate stool in the colon with increased air in the transverse colon.  There is no evidence of obstruction.  No dilated small bowel.  Moderate rotoscoliosis of the lumbar spine.  The stents in the right iliac artery.  Evidence of abdominal wall mesh in place.  Impression:  Increased air   in the transverse portion of the colon.  No change in the moderate stool in the colon.  No obstruction.   Original Report Authenticated By: JAMES H. MAXWELL, M.[name].    Medications:  Prior to Admission:  Prescriptions prior to admission  Medication Sig Dispense Refill  . clopidogrel (PLAVIX) 75 MG tablet Take 75 mg by mouth daily.      . diltiazem (DILACOR XR) 180 MG 24 hr capsule Take 180 mg by mouth daily.      . metoprolol (LOPRESSOR) 100 MG tablet Take 100 mg by mouth 2 (two) times daily.      . pantoprazole (PROTONIX) 40 MG tablet Take 40 mg by mouth daily.      . simvastatin (ZOCOR) 20 MG tablet Take 10 mg by mouth every evening. Pt cuts tab in half for 10 mg dose      . warfarin (COUMADIN) 5 MG tablet Take 2.5-5 mg by mouth See admin instructions. Pt takes 1/2 of 5 mg  Tablet for 2.5 mg dose on Monday,Wednesday,Friday,sunday. On Tuesday,Thursday, Saturday pt takes 5 mg.       Scheduled:   . diltiazem  180 mg Oral Daily  . docusate sodium  100 mg Oral BID  . metoprolol tartrate  100 mg Oral BID  . phytonadione (VITAMIN K) IV  10 mg Intravenous Once  . simvastatin  10 mg Oral q1800  . Warfarin - Pharmacist Dosing Inpatient   Does not apply q1800  . DISCONTD: ferrous fumarate  1 tablet Oral BID  . DISCONTD: pantoprazole  40 mg Oral Q1200  . DISCONTD: polyethylene glycol  17 g Oral Daily  . DISCONTD: Warfarin - Pharmacist Dosing Inpatient   Does not apply q1800   Continuous:   . sodium chloride    . pantoprozole (PROTONIX) infusion 8 mg/hr (07/03/11 0415)    Assessment/Plan: GI bleed, check serial H&H, keep hemoglobin greater than 9, GI physician has been notified, Of note last colonoscopy showed diverticulosis, Perform 2011 History of PE, tolerated therapeutic Coumadin over the last 5-6 months without any bleeding. Status post repair of hip fracture stable Hypertension Asthma Constipation History of chronic kidney disease, recent jump in creatinine over the last 2 days question Whether secondary to GI bleed versus poor p.o. Intake continue gentle hydration.  LOS: 9 days    [name] 07/03/2011, 8:42 AM   

## 2011-07-03 NOTE — Op Note (Signed)
[facility] [address] [place], [place]  [address]  ENDOSCOPY PROCEDURE REPORT  PATIENT:  [name], [name]  MR#:  [id number] BIRTHDATE:  [birth year], 77 yrs. old  GENDER:  male  ENDOSCOPIST:  Charlott Rakes, MD Referred by:  Renford Dills, M.D.  PROCEDURE DATE:  07/03/2011 PROCEDURE:  EGD with biopsy, [id number] ASA CLASS:  Class III INDICATIONS:  red rectal bleeding, anemia  MEDICATIONS:   Fentanyl 37.5 mcg IV, Versed 3 mg IV, Cetacaine spray x 2  TOPICAL ANESTHETIC: see above  DESCRIPTION OF PROCEDURE:   After the risks benefits and alternatives of the procedure were thoroughly explained, informed consent was obtained.  The [id number] ([id number]) endoscope was introduced through the mouth and advanced to the second portion of the duodenum, without limitations.  The instrument was slowly withdrawn as the mucosa was fully examined. <<PROCEDUREIMAGES>>  FINDINGS:  The endoscope was inserted into the oropharynx and esophagus was intubated.  Minimal whitish mucosa in the mid-to-distal esophagus without any ulceration or bleeding. Endoscope was advanced into the stomach, which revealed minimal erythema in the distal stomach consistent with minimal gastritis. The distal stomach was otherwise unremarkable. No blood products were noted. Retroflexion revealed a small hiatal hernia. The endoscope was advanced to the duodenal bulb and second portion of duodenum which revealed a medium-sized polypoid lesion with stippled overlying mucosa. No ulcer or erythema noted on this lesion that appears to be a benign pancreatic rest.  A biopsy was taken of this lesion. No evidence of a bleeding source seen on the EGD. The endoscope was withdrawn back into the stomach to confirm the above findings.  COMPLICATIONS:  None  ENDOSCOPIC IMPRESSION:  1. Duodenal bulb benign-appearing lesion -s/p biopsy. 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 Small hiatal hernia 3. No source of  bleeding seen  RECOMMENDATIONS:     1. Follow H/Hs 2. If rectal bleeding persists despite, correction of INR then may need to consider repeat colonoscopy  REPEAT EXAM:  N/A  ______________________________ Charlott Rakes, MD  CC:  Renford Dills MD  n. Rosalie DoctorCharlott Rakes at 07/03/2011 02:57 PM  [name], [phone]

## 2011-07-03 NOTE — Consult Note (Signed)
Referring Provider: Dr. Nehemiah Settle Primary Care Physician:  Katy Apo, MD, MD Primary Gastroenterologist:  Dr. Ewing Schlein  Reason for Consultation:  GI bleed  HPI: [name] is a 78 y.o. male with recent hip fracture and surgery who was on Coumadin for a history of a PE/DVT, who developed maroon stools with clots last night and was transferred from the floor to the ICU. No rectal bleeding this morning since transfer to the ICU. [name] denies a history of peptic ulcer disease, heartburn, or dysphagia. +Nausea. Denies vomiting. Colonoscopy in 12/2009 showed diverticulosis (cecum, sigmoid colon), internal hemorrhoids, and hyperplastic polyps.  Past Medical History  Diagnosis Date  . Asthma   . Hypertension   . Pulmonary embolism   . Peripheral artery disease   . Hypercholesteremia   . Bursitis of right shoulder   . DVT of leg (deep venous thrombosis)     Past Surgical History  Procedure Date  . Hernia repair   . Appendectomy   . Femoral-femoral bypass graft   . Iliac artery stent     Prior to Admission medications   Medication Sig Start Date End Date Taking? Authorizing Provider  clopidogrel (PLAVIX) 75 MG tablet Take 75 mg by mouth daily.   Yes Historical Provider, MD  diltiazem (DILACOR XR) 180 MG 24 hr capsule Take 180 mg by mouth daily.   Yes Historical Provider, MD  metoprolol (LOPRESSOR) 100 MG tablet Take 100 mg by mouth 2 (two) times daily.   Yes Historical Provider, MD  pantoprazole (PROTONIX) 40 MG tablet Take 40 mg by mouth daily.   Yes Historical Provider, MD  simvastatin (ZOCOR) 20 MG tablet Take 10 mg by mouth every evening. Pt cuts tab in half for 10 mg dose   Yes Historical Provider, MD  warfarin (COUMADIN) 5 MG tablet Take 2.5-5 mg by mouth See admin instructions. Pt takes 1/2 of 5 mg  Tablet for 2.5 mg dose on Monday,Wednesday,Friday,sunday. On Tuesday,Thursday, Saturday pt takes 5 mg.   Yes Historical Provider, MD    Scheduled Meds:   . diltiazem  180 mg  Oral Daily  . docusate sodium  100 mg Oral BID  . metoprolol  5 mg Intravenous Q6H  . metoprolol tartrate  100 mg Oral BID  . phytonadione (VITAMIN K) IV  10 mg Intravenous Once  . simvastatin  10 mg Oral q1800  . Warfarin - Pharmacist Dosing Inpatient   Does not apply q1800  . DISCONTD: pantoprazole  40 mg Oral Q1200  . DISCONTD: polyethylene glycol  17 g Oral Daily  . DISCONTD: Warfarin - Pharmacist Dosing Inpatient   Does not apply q1800   Continuous Infusions:   . sodium chloride    . sodium chloride    . pantoprozole (PROTONIX) infusion 8 mg/hr (07/03/11 0415)   PRN Meds:.acetaminophen, acetaminophen, alum & mag hydroxide-simeth, bisacodyl, HYDROcodone-acetaminophen, menthol-cetylpyridinium, methocarbamol(ROBAXIN) IV, methocarbamol, metoCLOPramide (REGLAN) injection, metoCLOPramide, ondansetron (ZOFRAN) IV, ondansetron, phenol, polyethylene glycol, zolpidem  Allergies as of 06/24/2011  . (No Known Allergies)    History reviewed. No pertinent family history.  History   Social History  . Marital Status: Married    Spouse Name: N/A    Number of Children: N/A  . Years of Education: N/A   Occupational History  . Not on file.   Social History Main Topics  . Smoking status: Former Games developer  . Smokeless tobacco: Never Used  . Alcohol Use: No  . Drug Use: No  . Sexually Active: No   Other Topics Concern  .  Not on file   Social History Narrative  . No narrative on file    Review of Systems: All negative except as stated above in HPI.  Physical Exam: Vital signs: Filed Vitals:   07/03/11 1000  BP: 145/72  Pulse: 112  Temp: 97.6 F (36.4 C)  Resp: 21   Last BM Date: 07/02/11 (blood per rectum) General:   Elderly, frail, Alert,  NAD  Lungs:  Clear throughout to auscultation.   No wheezes, crackles, or rhonchi. No acute distress. Heart:  Regular rate and rhythm; no murmurs, clicks, rubs,  or gallops. Abdomen: Distended, nontender, positive bowel sounds  Rectal:   Deferred  GI:  Lab Results:  Basename 07/03/11 0324 07/02/11 1920 07/02/11 0429  WBC 13.9* 11.4* 10.0  HGB 9.9* 10.3* 10.8*  HCT 31.3* 32.2* 34.7*  PLT 368 306 287   BMET  Basename 07/03/11 0324 07/02/11 0429 07/01/11 0344  NA 136 139 141  K 4.4 4.1 3.8  CL 103 103 107  CO2 24 28 24   GLUCOSE 167* 135* 160*  BUN 46* 36* 27*  CREATININE 2.20* 2.10* 1.31  CALCIUM 8.2* 8.6 9.1   LFT No results found for this basename: PROT,ALBUMIN,AST,ALT,ALKPHOS,BILITOT,BILIDIR,IBILI in the last 72 hours PT/INR  Basename 07/03/11 0324 07/02/11 0429  LABPROT 35.2* 33.7*  INR 3.44* 3.26*     Studies/Results: Dg Abd 1 View  07/03/2011  *RADIOLOGY REPORT*  Clinical Data: Follow up of small bowel obstruction.  Blood in stool.  Abdominal distention and swelling.  ABDOMEN - 1 VIEW  Comparison: 1 day prior  Findings: Right-sided proximal femoral fixation.  No definite free intraperitoneal air.  There is lucency along the right hemi diaphragm which could be artifactual on the first image. Borderline gaseous distention of the colon is not significantly changed.  This primarily affects the transverse segment. Descending colon is normal in caliber.  There is a moderate amount of rectal stool which is similar.  Right iliac vascular stent.  No significant small bowel dilatation.  No pneumatosis.  IMPRESSION: Similar moderate rectal stool, suggesting a component of fecal impaction.  Borderline colonic dilatation without evidence of obstruction.  Apparent lucency along the right hemi diaphragm, possibly artifactual on this supine film.  If there is a clinical concern of bowel perforation/free intraperitoneal air, consider upright and decubitus views.  Original Report Authenticated By: Consuello Bossier, M.D.   Dg Abd Portable 1v  07/02/2011  Reason for examination:  abdominal pain and distention. Constipation.  ABDOMINAL RADIOGRAPH ONE VIEW PORTABLE SUPINE  Comparison:  Radiographs dated 06/30/2011  Findings:  There  is moderate stool in the colon with increased air in the transverse colon.  There is no evidence of obstruction.  No dilated small bowel.  Moderate rotoscoliosis of the lumbar spine.  The stents in the right iliac artery.  Evidence of abdominal wall mesh in place.  Impression:  Increased air in the transverse portion of the colon.  No change in the moderate stool in the colon.  No obstruction.  Original Report Authenticated By: Gwynn Burly, M.D.    Impression/Plan: 77yo with maroon stools and clots in the setting of an INR of 3.44, while on Coumadin for history of a PE. Correct INR. EGD this PM. NPO. Agree with Protonix infusion. If EGD unrevealing, then will see if rebleeding occurs after correction of INR to decide whether repeat colonoscopy needed or not.   LOS: 9 days   Kelee Cunningham C.  07/03/2011, 10:20 AM

## 2011-07-03 NOTE — Progress Notes (Signed)
Patient had increase in rectal bleeding, large amount blood noted saturated through bed pad, 3-4 large clots noted, active bleeding from rectum noted onto cotex pad red blood. VSS. Patient denies any complaints at this time. MD on call notified. To maintain IV access at this time and increase VS to every couple hours, to moniter closely.

## 2011-07-03 NOTE — Brief Op Note (Signed)
No bleeding or blood products noted. Polypoid lesion in duodenal bulb -s/p biopsy. No evidence of ulcer or recent bleeding on EGD. See Endopro for details.

## 2011-07-04 ENCOUNTER — Inpatient Hospital Stay (HOSPITAL_COMMUNITY): Payer: Medicare HMO

## 2011-07-04 LAB — COMPREHENSIVE METABOLIC PANEL
ALT: 15 U/L (ref 0–53)
Alkaline Phosphatase: 77 U/L (ref 39–117)
BUN: 45 mg/dL — ABNORMAL HIGH (ref 6–23)
CO2: 23 mEq/L (ref 19–32)
GFR calc Af Amer: 38 mL/min — ABNORMAL LOW (ref >90)
GFR calc non Af Amer: 33 mL/min — ABNORMAL LOW (ref >90)
Glucose, Bld: 124 mg/dL — ABNORMAL HIGH (ref 70–99)
Potassium: 3.8 mEq/L (ref 3.5–5.1)
Sodium: 143 mEq/L (ref 135–145)
Total Bilirubin: 0.7 mg/dL (ref 0.3–1.2)

## 2011-07-04 LAB — TYPE AND SCREEN
Antibody Screen: NEGATIVE
Unit division: 0

## 2011-07-04 LAB — PREPARE FRESH FROZEN PLASMA: Unit division: 0

## 2011-07-04 LAB — BASIC METABOLIC PANEL
Calcium: 8 mg/dL — ABNORMAL LOW (ref 8.4–10.5)
Chloride: 108 mEq/L (ref 96–112)
Creatinine, Ser: 1.97 mg/dL — ABNORMAL HIGH (ref 0.50–1.35)
GFR calc Af Amer: 37 mL/min — ABNORMAL LOW (ref >90)

## 2011-07-04 LAB — CBC
Platelets: 335 10*3/uL (ref 150–400)
RDW: 16.5 % — ABNORMAL HIGH (ref 11.5–15.5)
WBC: 11.3 10*3/uL — ABNORMAL HIGH (ref 4.0–10.5)

## 2011-07-04 LAB — PROTIME-INR
INR: 1.2 (ref 0.00–1.49)
Prothrombin Time: 15.5 seconds — ABNORMAL HIGH (ref 11.6–15.2)

## 2011-07-04 MED ORDER — PANTOPRAZOLE SODIUM 40 MG PO TBEC
40.0000 mg | DELAYED_RELEASE_TABLET | Freq: Every day | ORAL | Status: DC
  Administered 2011-07-04 – 2011-07-05 (×2): 40 mg via ORAL
  Filled 2011-07-04 (×3): qty 1

## 2011-07-04 MED ORDER — DOCUSATE SODIUM 100 MG PO CAPS
100.0000 mg | ORAL_CAPSULE | Freq: Two times a day (BID) | ORAL | Status: DC
  Administered 2011-07-04 – 2011-07-05 (×4): 100 mg via ORAL
  Filled 2011-07-04 (×6): qty 1

## 2011-07-04 MED ORDER — SIMVASTATIN 20 MG PO TABS
20.0000 mg | ORAL_TABLET | Freq: Every day | ORAL | Status: DC
  Administered 2011-07-04: 20 mg via ORAL
  Filled 2011-07-04 (×2): qty 1

## 2011-07-04 MED ORDER — MORPHINE SULFATE 2 MG/ML IJ SOLN
2.0000 mg | Freq: Once | INTRAMUSCULAR | Status: AC
  Administered 2011-07-04: 2 mg via INTRAVENOUS
  Filled 2011-07-04: qty 1

## 2011-07-04 MED ORDER — ACETAMINOPHEN 325 MG PO TABS: 650.0000 mg | ORAL_TABLET | Freq: Four times a day (QID) | ORAL | Status: DC | PRN

## 2011-07-04 MED ORDER — HYDROCODONE-ACETAMINOPHEN 5-325 MG PO TABS
2.0000 | ORAL_TABLET | ORAL | Status: DC | PRN
  Administered 2011-07-04 – 2011-07-09 (×6): 2 via ORAL
  Filled 2011-07-04 (×6): qty 2

## 2011-07-04 MED ORDER — METOPROLOL TARTRATE 100 MG PO TABS
100.0000 mg | ORAL_TABLET | Freq: Two times a day (BID) | ORAL | Status: DC
  Administered 2011-07-04 – 2011-07-05 (×4): 100 mg via ORAL
  Filled 2011-07-04 (×6): qty 1

## 2011-07-04 MED ORDER — POLYETHYLENE GLYCOL 3350 17 G PO PACK
17.0000 g | PACK | Freq: Every day | ORAL | Status: DC
  Administered 2011-07-04 – 2011-07-05 (×2): 17 g via ORAL
  Filled 2011-07-04 (×3): qty 1

## 2011-07-04 NOTE — Progress Notes (Signed)
ANTICOAGULATION CONSULT NOTE - Follow Up Consult  Pharmacy Consult for Warfarin Indication: Hx of PE, s/p ORIF 2/22 No Known Allergies  Patient Measurements: Height: 5\' 6"  (167.6 cm) Weight: 153 lb 14.1 oz (69.8 kg) IBW/kg (Calculated) : 63.8   Vital Signs: Temp: 98.1 F (36.7 C) (03/02 0800) Temp src: Oral (03/02 0800) BP: 150/75 mmHg (03/02 0400) Pulse Rate: 128  (03/02 0400)  Labs:  Basename 07/04/11 0919 07/04/11 0310 07/03/11 1344 07/03/11 1343 07/03/11 0324  HGB 10.5* -- 6.9* -- --  HCT 32.0* -- 21.3* -- 31.3*  PLT 335 -- 249 -- 368  APTT -- -- -- -- --  LABPROT -- 15.5* -- 17.6* 35.2*  INR -- 1.20 -- 1.42 3.44*  HEPARINUNFRC -- -- -- -- --  CREATININE 1.90* 1.97* -- -- 2.20*  CKTOTAL -- -- -- -- --  CKMB -- -- -- -- --  TROPONINI -- -- -- -- --   Estimated Creatinine Clearance: 30.3 ml/min (by C-G formula based on Cr of 1.9).   Assessment:  Chronic Coumadin for hx of PE, home dose 2.5mg  MWF, 5mg  other days  ORIF R hip 2/22, Coumadin resumed, INR therapeutic 2/25-2/27, above goal 2/28 and 3/1 (3.44)  Coumadin held for rectal bleed, Vit K, transfusion.  EGD 3/1 benign, no sign bleed, rectal bleed slowing today. Colonoscopy if bleed continues.  INR 1.2 today.  Goal of Therapy:  INR 2-3 when/if Coumadin resumed   Plan:  Continue to hold Coumadin, await plan for anticoagulation. SCD's ordered.  Otho Bellows PharmD Pager [phone] 07/04/2011,11:17 AM

## 2011-07-04 NOTE — Progress Notes (Signed)
[facility] Progress Note  Subjective: Patient complaining of a lot of abdominal pain and distention this morning, unclear whether acute or chronic. Does not look uncomfortable but has received morphine. He he had a bloody bowel movement earlier this morning.  Objective: Vital signs in last 24 hours: Temp:  [97 F (36.1 C)-99 F (37.2 C)] 98.1 F (36.7 C) (03/02 0800) Pulse Rate:  [99-130] 128  (03/02 0400) Resp:  [15-22] 20  (03/02 0400) BP: (124-165)/(63-91) 150/75 mmHg (03/02 0400) SpO2:  [94 %-100 %] 96 % (03/02 0400) Weight:  [69.8 kg (153 lb 14.1 oz)] 69.8 kg (153 lb 14.1 oz) (03/02 0649) Weight change: 2.3 kg (5 lb 1.1 oz)   PE: Abdomen is distended but apparently this was noted yesterday a KUB yesterday which showed no definite obstruction.  Lab Results: Results for orders placed during the hospital encounter of 06/24/11 (from the past 24 hour(s))  PROTIME-INR     Status: Abnormal   Collection Time   07/03/11  1:43 PM      Component Value Range   Prothrombin Time 17.6 (*) 11.6 - 15.2 (seconds)   INR 1.42  0.00 - 1.49   CBC     Status: Abnormal   Collection Time   07/03/11  1:44 PM      Component Value Range   WBC 9.3  4.0 - 10.5 (K/uL)   RBC 2.49 (*) 4.22 - 5.81 (MIL/uL)   Hemoglobin 6.9 (*) 13.0 - 17.0 (g/dL)   HCT 16.1 (*) 09.6 - 52.0 (%)   MCV 85.5  78.0 - 100.0 (fL)   MCH 27.7  26.0 - 34.0 (pg)   MCHC 32.4  30.0 - 36.0 (g/dL)   RDW 04.5 (*) 40.9 - 15.5 (%)   Platelets 249  150 - 400 (K/uL)  PREPARE RBC (CROSSMATCH)     Status: Normal   Collection Time   07/03/11  2:30 PM      Component Value Range   Order Confirmation ORDER PROCESSED BY BLOOD BANK    PROTIME-INR     Status: Abnormal   Collection Time   07/04/11  3:10 AM      Component Value Range   Prothrombin Time 15.5 (*) 11.6 - 15.2 (seconds)   INR 1.20  0.00 - 1.49   BASIC METABOLIC PANEL     Status: Abnormal   Collection Time   07/04/11  3:10 AM      Component Value Range   Sodium 142  135 -  145 (mEq/L)   Potassium 3.7  3.5 - 5.1 (mEq/L)   Chloride 108  96 - 112 (mEq/L)   CO2 24  19 - 32 (mEq/L)   Glucose, Bld 134 (*) 70 - 99 (mg/dL)   BUN 46 (*) 6 - 23 (mg/dL)   Creatinine, Ser 8.11 (*) 0.50 - 1.35 (mg/dL)   Calcium 8.0 (*) 8.4 - 10.5 (mg/dL)   GFR calc non Af Amer 31 (*) >90 (mL/min)   GFR calc Af Amer 37 (*) >90 (mL/min)    Studies/Results: Dg Abd 1 View  07/03/2011  *RADIOLOGY REPORT*  Clinical Data: Follow up of small bowel obstruction.  Blood in stool.  Abdominal distention and swelling.  ABDOMEN - 1 VIEW  Comparison: 1 day prior  Findings: Right-sided proximal femoral fixation.  No definite free intraperitoneal air.  There is lucency along the right hemi diaphragm which could be artifactual on the first image. Borderline gaseous distention of the colon is not significantly changed.  This primarily affects  the transverse segment. Descending colon is normal in caliber.  There is a moderate amount of rectal stool which is similar.  Right iliac vascular stent.  No significant small bowel dilatation.  No pneumatosis.  IMPRESSION: Similar moderate rectal stool, suggesting a component of fecal impaction.  Borderline colonic dilatation without evidence of obstruction.  Apparent lucency along the right hemi diaphragm, possibly artifactual on this supine film.  If there is a clinical concern of bowel perforation/free intraperitoneal air, consider upright and decubitus views.  Original Report Authenticated By: Consuello Bossier, M.D.      Assessment: GI bleeding with no upper source definitely seen. Abdominal pain unclear whether acute or chronic with abdominal distention but no sign of significant obstruction plain films yesterday  Plan: Advance diet as tolerated Await hemoglobin which was not drawn this morning. We'll also obtain lipase and liver function tests.    Jaylei Fuerte C 07/04/2011, 9:27 AM

## 2011-07-04 NOTE — Progress Notes (Signed)
Subjective: Patient is a pleasant, no apparent distress. Still with some abdominal distention which he has had all week. X-rays previously have shown increased stool burden and at the transverse colon. Hemoglobin is stable, apparently GI bleed has stopped, EGD without etiology. Previous colonoscopy outpatient 2 years ago with diverticulosis. GI input appreciated. Patient wishes to start by mouth diet and has been cleared by GI for clear liquids  Objective: Vital signs in last 24 hours: Temp:  [97 F (36.1 C)-99 F (37.2 C)] 98.1 F (36.7 C) (03/02 0800) Pulse Rate:  [106-130] 128  (03/02 0400) Resp:  [15-22] 20  (03/02 0400) BP: (124-165)/(65-91) 150/75 mmHg (03/02 0400) SpO2:  [94 %-100 %] 96 % (03/02 0400) Weight:  [69.8 kg (153 lb 14.1 oz)] 69.8 kg (153 lb 14.1 oz) (03/02 0649) Weight change: 2.3 kg (5 lb 1.1 oz) Last BM Date: 07/02/11 (blood per rectum)  Intake/Output from previous day: 03/01 0701 - 03/02 0700 In: 4065 [I.V.:1875; Blood:1180] Out: 150 [Urine:150] Intake/Output this shift: Total I/O In: 75 [I.V.:75] Out: 200 [Urine:200]  General appearance: alert and cooperative Resp: clear to auscultation bilaterally Cardio: Tachycardic, regular GI: Slight protuberant, tympanic, unchanged from previous exam Extremities: Right hip unchanged, no calf pain, PAS hose in place  Lab Results:  Results for orders placed during the hospital encounter of 06/24/11 (from the past 24 hour(s))  PROTIME-INR     Status: Abnormal   Collection Time   07/03/11  1:43 PM      Component Value Range   Prothrombin Time 17.6 (*) 11.6 - 15.2 (seconds)   INR 1.42  0.00 - 1.49   CBC     Status: Abnormal   Collection Time   07/03/11  1:44 PM      Component Value Range   WBC 9.3  4.0 - 10.5 (K/uL)   RBC 2.49 (*) 4.22 - 5.81 (MIL/uL)   Hemoglobin 6.9 (*) 13.0 - 17.0 (g/dL)   HCT 45.4 (*) 09.8 - 52.0 (%)   MCV 85.5  78.0 - 100.0 (fL)   MCH 27.7  26.0 - 34.0 (pg)   MCHC 32.4  30.0 - 36.0 (g/dL)   RDW 11.9 (*) 14.7 - 15.5 (%)   Platelets 249  150 - 400 (K/uL)  PREPARE RBC (CROSSMATCH)     Status: Normal   Collection Time   07/03/11  2:30 PM      Component Value Range   Order Confirmation ORDER PROCESSED BY BLOOD BANK    PROTIME-INR     Status: Abnormal   Collection Time   07/04/11  3:10 AM      Component Value Range   Prothrombin Time 15.5 (*) 11.6 - 15.2 (seconds)   INR 1.20  0.00 - 1.49   BASIC METABOLIC PANEL     Status: Abnormal   Collection Time   07/04/11  3:10 AM      Component Value Range   Sodium 142  135 - 145 (mEq/L)   Potassium 3.7  3.5 - 5.1 (mEq/L)   Chloride 108  96 - 112 (mEq/L)   CO2 24  19 - 32 (mEq/L)   Glucose, Bld 134 (*) 70 - 99 (mg/dL)   BUN 46 (*) 6 - 23 (mg/dL)   Creatinine, Ser 8.29 (*) 0.50 - 1.35 (mg/dL)   Calcium 8.0 (*) 8.4 - 10.5 (mg/dL)   GFR calc non Af Amer 31 (*) >90 (mL/min)   GFR calc Af Amer 37 (*) >90 (mL/min)  CBC     Status: Abnormal  Collection Time   07/04/11  9:19 AM      Component Value Range   WBC 11.3 (*) 4.0 - 10.5 (K/uL)   RBC 3.78 (*) 4.22 - 5.81 (MIL/uL)   Hemoglobin 10.5 (*) 13.0 - 17.0 (g/dL)   HCT 40.9 (*) 81.1 - 52.0 (%)   MCV 84.7  78.0 - 100.0 (fL)   MCH 27.8  26.0 - 34.0 (pg)   MCHC 32.8  30.0 - 36.0 (g/dL)   RDW 91.4 (*) 78.2 - 15.5 (%)   Platelets 335  150 - 400 (K/uL)  LIPASE, BLOOD     Status: Normal   Collection Time   07/04/11  9:19 AM      Component Value Range   Lipase 52  11 - 59 (U/L)  COMPREHENSIVE METABOLIC PANEL     Status: Abnormal   Collection Time   07/04/11  9:19 AM      Component Value Range   Sodium 143  135 - 145 (mEq/L)   Potassium 3.8  3.5 - 5.1 (mEq/L)   Chloride 109  96 - 112 (mEq/L)   CO2 23  19 - 32 (mEq/L)   Glucose, Bld 124 (*) 70 - 99 (mg/dL)   BUN 45 (*) 6 - 23 (mg/dL)   Creatinine, Ser 9.56 (*) 0.50 - 1.35 (mg/dL)   Calcium 8.2 (*) 8.4 - 10.5 (mg/dL)   Total Protein 6.5  6.0 - 8.3 (g/dL)   Albumin 2.5 (*) 3.5 - 5.2 (g/dL)   AST 19  0 - 37 (U/L)   ALT 15  0 - 53 (U/L)    Alkaline Phosphatase 77  39 - 117 (U/L)   Total Bilirubin 0.7  0.3 - 1.2 (mg/dL)   GFR calc non Af Amer 33 (*) >90 (mL/min)   GFR calc Af Amer 38 (*) >90 (mL/min)      Studies/Results: Dg Abd 1 View  07/03/2011  *RADIOLOGY REPORT*  Clinical Data: Follow up of small bowel obstruction.  Blood in stool.  Abdominal distention and swelling.  ABDOMEN - 1 VIEW  Comparison: 1 day prior  Findings: Right-sided proximal femoral fixation.  No definite free intraperitoneal air.  There is lucency along the right hemi diaphragm which could be artifactual on the first image. Borderline gaseous distention of the colon is not significantly changed.  This primarily affects the transverse segment. Descending colon is normal in caliber.  There is a moderate amount of rectal stool which is similar.  Right iliac vascular stent.  No significant small bowel dilatation.  No pneumatosis.  IMPRESSION: Similar moderate rectal stool, suggesting a component of fecal impaction.  Borderline colonic dilatation without evidence of obstruction.  Apparent lucency along the right hemi diaphragm, possibly artifactual on this supine film.  If there is a clinical concern of bowel perforation/free intraperitoneal air, consider upright and decubitus views.  Original Report Authenticated By: Consuello Bossier, M.D.    Medications:  Prior to Admission:  Prescriptions prior to admission  Medication Sig Dispense Refill  . clopidogrel (PLAVIX) 75 MG tablet Take 75 mg by mouth daily.      Marland Kitchen diltiazem (DILACOR XR) 180 MG 24 hr capsule Take 180 mg by mouth daily.      . metoprolol (LOPRESSOR) 100 MG tablet Take 100 mg by mouth 2 (two) times daily.      . pantoprazole (PROTONIX) 40 MG tablet Take 40 mg by mouth daily.      . simvastatin (ZOCOR) 20 MG tablet Take 10 mg by mouth every  evening. Pt cuts tab in half for 10 mg dose      . warfarin (COUMADIN) 5 MG tablet Take 2.5-5 mg by mouth See admin instructions. Pt takes 1/2 of 5 mg  Tablet for 2.5 mg  dose on Monday,Wednesday,Friday,sunday. On Tuesday,Thursday, Saturday pt takes 5 mg.       Scheduled:   . metoprolol  5 mg Intravenous Q6H  .  morphine injection  2 mg Intravenous Once  . Warfarin - Pharmacist Dosing Inpatient   Does not apply q1800  . DISCONTD: diltiazem  180 mg Oral Daily  . DISCONTD: docusate sodium  100 mg Oral BID  . DISCONTD: metoprolol tartrate  100 mg Oral BID  . DISCONTD: simvastatin  10 mg Oral q1800   Continuous:   . sodium chloride 50 mL/hr (07/04/11 0909)  . pantoprozole (PROTONIX) infusion 8 mg/hr (07/04/11 0046)    Assessment/Plan: GI bleed, probable from known diverticulosis as EGD negative for source. Hemoglobin stable at the transfusion. Resume meds and clear liquid diet. Followup H&H Acute on chronic renal insufficiency probably volume dependent Hypertension, BP slightly elevated, rate uncontrolled we'll resume diltiazem and metoprolol orally Hypercholesterolemia resume statin Status post repair of right hip fracture, mobilize, try to limit analgesia as much as possible History of PE, patient plans were for 6 months of Coumadin, we'll continue anticoagulation when stable during this perioperative phase and reevaluate need for prolonged Coumadin. His previous lotion the ultrasound negative for DVT, he had a high probably VQ scan for PE. CT scan was not obtained because of renal dysfunction.  LOS: 10 days   Tal Kempker D 07/04/2011, 12:28 PM

## 2011-07-05 ENCOUNTER — Inpatient Hospital Stay (HOSPITAL_COMMUNITY): Payer: Medicare HMO

## 2011-07-05 LAB — BASIC METABOLIC PANEL
BUN: 40 mg/dL — ABNORMAL HIGH (ref 6–23)
Calcium: 8.5 mg/dL (ref 8.4–10.5)
Creatinine, Ser: 1.68 mg/dL — ABNORMAL HIGH (ref 0.50–1.35)
GFR calc non Af Amer: 38 mL/min — ABNORMAL LOW (ref >90)
Glucose, Bld: 157 mg/dL — ABNORMAL HIGH (ref 70–99)

## 2011-07-05 LAB — PROTIME-INR: Prothrombin Time: 15.9 seconds — ABNORMAL HIGH (ref 11.6–15.2)

## 2011-07-05 MED ORDER — MINERAL OIL RE ENEM
1.0000 | ENEMA | Freq: Once | RECTAL | Status: AC
  Administered 2011-07-05: 22:00:00 via RECTAL
  Filled 2011-07-05: qty 1

## 2011-07-05 MED ORDER — WARFARIN - PHARMACIST DOSING INPATIENT
Freq: Every day | Status: DC
  Filled 2011-07-05: qty 1

## 2011-07-05 MED ORDER — MINERAL OIL RE ENEM
1.0000 | ENEMA | Freq: Once | RECTAL | Status: AC
  Administered 2011-07-05: 12:00:00 via RECTAL
  Filled 2011-07-05 (×2): qty 1

## 2011-07-05 MED ORDER — WARFARIN - PHARMACIST DOSING INPATIENT
Freq: Every day | Status: DC
  Filled 2011-07-05 (×2): qty 1

## 2011-07-05 MED ORDER — ATORVASTATIN CALCIUM 10 MG PO TABS
10.0000 mg | ORAL_TABLET | Freq: Every day | ORAL | Status: DC
  Administered 2011-07-05 – 2011-07-09 (×2): 10 mg via ORAL
  Filled 2011-07-05 (×6): qty 1

## 2011-07-05 MED ORDER — DILTIAZEM HCL ER COATED BEADS 180 MG PO CP24
180.0000 mg | ORAL_CAPSULE | Freq: Every day | ORAL | Status: DC
  Administered 2011-07-05: 180 mg via ORAL
  Filled 2011-07-05 (×2): qty 1

## 2011-07-05 NOTE — Progress Notes (Signed)
[facility] Progress Note  Subjective: Still uncomfortable in his abdomen with abdominal distention no bowel movement  Objective: Vital signs in last 24 hours: Temp:  [97.8 F (36.6 C)-99.9 F (37.7 C)] 98 F (36.7 C) (03/03 0800) Pulse Rate:  [100-134] 133  (03/03 1000) Resp:  [18-24] 23  (03/03 1000) BP: (120-148)/(68-93) 131/83 mmHg (03/03 0900) SpO2:  [94 %-96 %] 94 % (03/03 1000) Weight change:    PE: Abdomen tense distended. Rectal exam reveals a large distal fecal impaction  Lab Results: Results for orders placed during the hospital encounter of 06/24/11 (from the past 24 hour(s))  PROTIME-INR     Status: Abnormal   Collection Time   07/05/11  3:40 AM      Component Value Range   Prothrombin Time 15.9 (*) 11.6 - 15.2 (seconds)   INR 1.24  0.00 - 1.49   BASIC METABOLIC PANEL     Status: Abnormal   Collection Time   07/05/11  3:40 AM      Component Value Range   Sodium 143  135 - 145 (mEq/L)   Potassium 4.5  3.5 - 5.1 (mEq/L)   Chloride 112  96 - 112 (mEq/L)   CO2 22  19 - 32 (mEq/L)   Glucose, Bld 157 (*) 70 - 99 (mg/dL)   BUN 40 (*) 6 - 23 (mg/dL)   Creatinine, Ser 1.61 (*) 0.50 - 1.35 (mg/dL)   Calcium 8.5  8.4 - 09.6 (mg/dL)   GFR calc non Af Amer 38 (*) >90 (mL/min)   GFR calc Af Amer 44 (*) >90 (mL/min)    Studies/Results: Dg Abd Acute W/chest  07/04/2011  *RADIOLOGY REPORT*  Clinical Data: Abdominal tightness and distention  ACUTE ABDOMEN SERIES (ABDOMEN 2 VIEW & CHEST 1 VIEW)  Comparison: Plain films 07/03/2011  Findings: Lungs are clear.  No free air beneath hemidiaphragms on the semiupright view.  The colon is gas distended to a similar degree compared to prior. There is a stool ball the rectum measuring 6.5 cm.  No dilated loops of small bowel are evident.  IMPRESSION: 1.  No significant change. 2.  Gas in the colon.  3.  Stool ball in the rectum. 4.  No intraperitoneal free air on this semiupright view.  Original Report Authenticated By: Genevive Bi, M.D.      Assessment: GI bleeding resolved Fecal impaction  Plan: Fleets enemas with digital disimpaction if unsuccessful.    Adiba Fargnoli C 07/05/2011, 11:23 AM

## 2011-07-05 NOTE — Progress Notes (Signed)
Subjective: Patient still without bowel movement, x-ray essentially unchanged, it does reveal fecal impaction. No additional bleeding, hemoglobin stable, GI input reviewed and appreciated hopefully resume anticoagulation and Plavix after fecal impaction resolved  Objective: Vital signs in last 24 hours: Temp:  [97.9 F (36.6 C)-99.9 F (37.7 C)] 98 F (36.7 C) (03/03 0800) Pulse Rate:  [100-134] 127  (03/03 1135) Resp:  [18-24] 22  (03/03 1135) BP: (120-157)/(68-93) 157/90 mmHg (03/03 1135) SpO2:  [94 %-96 %] 95 % (03/03 1135) Weight change:  Last BM Date: 07/02/11 (blood per rectum)  Intake/Output from previous day: 03/02 0701 - 03/03 0700 In: 1215 [P.O.:650; I.V.:565] Out: 475 [Urine:475] Intake/Output this shift: Total I/O In: 40 [I.V.:40] Out: 50 [Urine:50]  General appearance: alert and cooperative Resp: clear to auscultation bilaterally Cardio: Slightly tachycardic but regular Abdomen distended, tympanic, positive bowel sounds Extremities unchanged Lab Results:  Results for orders placed during the hospital encounter of 06/24/11 (from the past 24 hour(s))  PROTIME-INR     Status: Abnormal   Collection Time   07/05/11  3:40 AM      Component Value Range   Prothrombin Time 15.9 (*) 11.6 - 15.2 (seconds)   INR 1.24  0.00 - 1.49   BASIC METABOLIC PANEL     Status: Abnormal   Collection Time   07/05/11  3:40 AM      Component Value Range   Sodium 143  135 - 145 (mEq/L)   Potassium 4.5  3.5 - 5.1 (mEq/L)   Chloride 112  96 - 112 (mEq/L)   CO2 22  19 - 32 (mEq/L)   Glucose, Bld 157 (*) 70 - 99 (mg/dL)   BUN 40 (*) 6 - 23 (mg/dL)   Creatinine, Ser 1.61 (*) 0.50 - 1.35 (mg/dL)   Calcium 8.5  8.4 - 09.6 (mg/dL)   GFR calc non Af Amer 38 (*) >90 (mL/min)   GFR calc Af Amer 44 (*) >90 (mL/min)      Studies/Results: Dg Abd Acute W/chest  07/04/2011  *RADIOLOGY REPORT*  Clinical Data: Abdominal tightness and distention  ACUTE ABDOMEN SERIES (ABDOMEN 2 VIEW & CHEST 1  VIEW)  Comparison: Plain films 07/03/2011  Findings: Lungs are clear.  No free air beneath hemidiaphragms on the semiupright view.  The colon is gas distended to a similar degree compared to prior. There is a stool ball the rectum measuring 6.5 cm.  No dilated loops of small bowel are evident.  IMPRESSION: 1.  No significant change. 2.  Gas in the colon.  3.  Stool ball in the rectum. 4.  No intraperitoneal free air on this semiupright view.  Original Report Authenticated By: Genevive Bi, M.D.    Medications:  Scheduled:   . docusate sodium  100 mg Oral BID  . metoprolol tartrate  100 mg Oral BID  . mineral oil  1 enema Rectal Once  . pantoprazole  40 mg Oral Q1200  . polyethylene glycol  17 g Oral Daily  . simvastatin  20 mg Oral q1800  . Warfarin - Pharmacist Dosing Inpatient   Does not apply q1800  . DISCONTD: metoprolol  5 mg Intravenous Q6H  . DISCONTD: Warfarin - Pharmacist Dosing Inpatient   Does not apply q1800  . DISCONTD: Warfarin - Pharmacist Dosing Inpatient   Does not apply q1800   Continuous:   . sodium chloride 10 mL/hr (07/04/11 1326)  . DISCONTD: pantoprozole (PROTONIX) infusion 8 mg/hr (07/04/11 0046)    Assessment/Plan: GI bleed probably secondary to diverticular  etiology, no recurrently, Fecal impaction, disimpaction attempted, plans for enema. Anemia stable Acute on chronic renal insufficiency, creatinine improve after IV fluids Recent fall with hip fracture, currently status post repair Asthma History of PE  LOS: 11 days   Bilbo Carcamo D 07/05/2011, 12:04 PM

## 2011-07-05 NOTE — Progress Notes (Signed)
ANTICOAGULATION CONSULT NOTE - Follow Up Consult  Pharmacy Consult for Warfarin Indication: Hx of PE, s/p ORIF 2/22 No Known Allergies  Patient Measurements: Height: 5\' 6"  (167.6 cm) Weight: 153 lb 14.1 oz (69.8 kg) IBW/kg (Calculated) : 63.8   Vital Signs: Temp: 98 F (36.7 C) (03/03 0800) Temp src: Oral (03/03 0800) BP: 131/83 mmHg (03/03 0900) Pulse Rate: 134  (03/03 0900)  Labs:  Basename 07/05/11 0340 07/04/11 0919 07/04/11 0310 07/03/11 1344 07/03/11 1343 07/03/11 0324  HGB -- 10.5* -- 6.9* -- --  HCT -- 32.0* -- 21.3* -- 31.3*  PLT -- 335 -- 249 -- 368  APTT -- -- -- -- -- --  LABPROT 15.9* -- 15.5* -- 17.6* --  INR 1.24 -- 1.20 -- 1.42 --  HEPARINUNFRC -- -- -- -- -- --  CREATININE 1.68* 1.90* 1.97* -- -- --  CKTOTAL -- -- -- -- -- --  CKMB -- -- -- -- -- --  TROPONINI -- -- -- -- -- --   Estimated Creatinine Clearance: 34.3 ml/min (by C-G formula based on Cr of 1.68).   Assessment:  Chronic Coumadin for hx of PE, home dose 2.5mg  MWF, 5mg  other days  ORIF R hip 2/22, Coumadin resumed, INR therapeutic 2/25-2/27, above goal 2/28 and 3/1 (3.44)  Coumadin held for rectal bleed, Vit K, transfusion.  EGD 3/1 benign, no sign bleed, rectal bleed slowing today. Colonoscopy if bleed continues.  INR 1.2 today.  Goal of Therapy:  INR 2-3 when Coumadin resumed   Plan:  Continue to hold Coumadin, plan to resume when pt stable SCD's ordered.  Otho Bellows PharmD Pager [phone] 07/05/2011,9:37 AM

## 2011-07-06 ENCOUNTER — Inpatient Hospital Stay (HOSPITAL_COMMUNITY): Payer: Medicare HMO

## 2011-07-06 ENCOUNTER — Encounter (HOSPITAL_COMMUNITY): Payer: Self-pay | Admitting: Gastroenterology

## 2011-07-06 ENCOUNTER — Other Ambulatory Visit: Payer: Self-pay

## 2011-07-06 DIAGNOSIS — K5649 Other impaction of intestine: Secondary | ICD-10-CM

## 2011-07-06 DIAGNOSIS — R112 Nausea with vomiting, unspecified: Secondary | ICD-10-CM

## 2011-07-06 DIAGNOSIS — K56609 Unspecified intestinal obstruction, unspecified as to partial versus complete obstruction: Secondary | ICD-10-CM

## 2011-07-06 DIAGNOSIS — R109 Unspecified abdominal pain: Secondary | ICD-10-CM

## 2011-07-06 LAB — BASIC METABOLIC PANEL
BUN: 45 mg/dL — ABNORMAL HIGH (ref 6–23)
CO2: 21 mEq/L (ref 19–32)
Calcium: 8.7 mg/dL (ref 8.4–10.5)
Creatinine, Ser: 1.84 mg/dL — ABNORMAL HIGH (ref 0.50–1.35)
Glucose, Bld: 156 mg/dL — ABNORMAL HIGH (ref 70–99)
Sodium: 143 mEq/L (ref 135–145)

## 2011-07-06 MED ORDER — IOHEXOL 300 MG/ML  SOLN
100.0000 mL | Freq: Once | INTRAMUSCULAR | Status: AC | PRN
  Administered 2011-07-06: 50 mL via INTRAVENOUS

## 2011-07-06 MED ORDER — PANTOPRAZOLE SODIUM 40 MG IV SOLR
40.0000 mg | INTRAVENOUS | Status: DC
  Administered 2011-07-06 – 2011-07-21 (×15): 40 mg via INTRAVENOUS
  Filled 2011-07-06 (×18): qty 40

## 2011-07-06 MED ORDER — METOPROLOL TARTRATE 1 MG/ML IV SOLN
5.0000 mg | Freq: Four times a day (QID) | INTRAVENOUS | Status: DC | PRN
  Administered 2011-07-06: 5 mg via INTRAVENOUS

## 2011-07-06 MED ORDER — METOPROLOL TARTRATE 1 MG/ML IV SOLN
INTRAVENOUS | Status: AC
  Filled 2011-07-06: qty 5

## 2011-07-06 MED ORDER — SODIUM CHLORIDE 0.9 % IV SOLN
INTRAVENOUS | Status: DC
  Administered 2011-07-06 – 2011-07-08 (×3): via INTRAVENOUS
  Administered 2011-07-08: 100 mL via INTRAVENOUS
  Administered 2011-07-09: 06:00:00 via INTRAVENOUS

## 2011-07-06 MED ORDER — IOHEXOL 300 MG/ML  SOLN: 80.0000 mL | Freq: Once | INTRAMUSCULAR | Status: AC | PRN

## 2011-07-06 MED ORDER — DILTIAZEM HCL 100 MG IV SOLR
5.0000 mg/h | INTRAVENOUS | Status: DC
  Administered 2011-07-06: 5 mg/h via INTRAVENOUS
  Administered 2011-07-06 – 2011-07-10 (×12): 15 mg/h via INTRAVENOUS
  Filled 2011-07-06 (×4): qty 100

## 2011-07-06 NOTE — Progress Notes (Signed)
Subjective: Events noted over the past 24 hours, patient had emesis, NG tube placed with bile material. There is no fever no chills or signs of aspiration. Abdomen still distended, he still hasn't had a bowel movement. There is no recurrent bleeding. X-rays yesterday showed fecal impaction. Heart rate elevated despite IV Lopressor Objective: Vital signs in last 24 hours: Temp:  [97.2 F (36.2 C)-98.2 F (36.8 C)] 98.2 F (36.8 C) (03/04 0400) Pulse Rate:  [109-139] 138  (03/04 0524) Resp:  [21-28] 24  (03/04 0524) BP: (131-157)/(76-92) 137/79 mmHg (03/04 0524) SpO2:  [92 %-95 %] 92 % (03/04 0524) Weight:  [69.5 kg (153 lb 3.5 oz)] 69.5 kg (153 lb 3.5 oz) (03/04 0400) Weight change:  Last BM Date: 07/02/11 (blood per rectum)  Intake/Output from previous day: 03/03 0701 - 03/04 0700 In: 608 [P.O.:225; I.V.:380; IV Piggyback:3] Out: 1540 [Urine:490; Emesis/NG output:1050] Intake/Output this shift:    General appearance: alert and cooperative Resp: clear to auscultation bilaterally Cardio: Tachycardia but regular GI: Distended, high-pitched bowel sounds, no guarding no rebound tenderness Extremities: extremities normal, atraumatic, no cyanosis or edema  Lab Results:  Results for orders placed during the hospital encounter of 06/24/11 (from the past 24 hour(s))  PROTIME-INR     Status: Abnormal   Collection Time   07/06/11  3:17 AM      Component Value Range   Prothrombin Time 15.6 (*) 11.6 - 15.2 (seconds)   INR 1.21  0.00 - 1.49   BASIC METABOLIC PANEL     Status: Abnormal   Collection Time   07/06/11  3:17 AM      Component Value Range   Sodium 143  135 - 145 (mEq/L)   Potassium 4.6  3.5 - 5.1 (mEq/L)   Chloride 111  96 - 112 (mEq/L)   CO2 21  19 - 32 (mEq/L)   Glucose, Bld 156 (*) 70 - 99 (mg/dL)   BUN 45 (*) 6 - 23 (mg/dL)   Creatinine, Ser 1.61 (*) 0.50 - 1.35 (mg/dL)   Calcium 8.7  8.4 - 09.6 (mg/dL)   GFR calc non Af Amer 34 (*) >90 (mL/min)   GFR calc Af Amer 40 (*)  >90 (mL/min)      Studies/Results: Dg Abd Acute W/chest  07/04/2011  *RADIOLOGY REPORT*  Clinical Data: Abdominal tightness and distention  ACUTE ABDOMEN SERIES (ABDOMEN 2 VIEW & CHEST 1 VIEW)  Comparison: Plain films 07/03/2011  Findings: Lungs are clear.  No free air beneath hemidiaphragms on the semiupright view.  The colon is gas distended to a similar degree compared to prior. There is a stool ball the rectum measuring 6.5 cm.  No dilated loops of small bowel are evident.  IMPRESSION: 1.  No significant change. 2.  Gas in the colon.  3.  Stool ball in the rectum. 4.  No intraperitoneal free air on this semiupright view.  Original Report Authenticated By: Genevive Bi, M.[name].   Dg Abd Portable 1v  07/06/2011  *RADIOLOGY REPORT*  Clinical data:  Abdominal distention, vomiting  ABDOMEN PORTABLE ONE-VIEW:  Comparison:  Portable exam 2345 hours compared to 07/04/2011  Findings: Coils from prior ventral hernia repair. Osseous demineralization. Orthopedic hardware proximal right femur post ORIF of intertrochanteric fracture. Lumbar scoliosis with degenerative disc disease changes. Gaseous distention of the colon and stomach. Air filled small bowel loops. Stool present in rectum. Bowel gas pattern appears nonobstructive. No bowel wall thickening or urinary tract calcification identified. Wall stents identified at the right iliac system.  IMPRESSION: Nonobstructive bowel gas pattern with mild gaseous distention of the colon noted, question ileus.  Original Report Authenticated By: Lollie Marrow, M.[name].    Medications:  Scheduled:   . atorvastatin  10 mg Oral q1800  . diltiazem  180 mg Oral Daily  . docusate sodium  100 mg Oral BID  . metoprolol      . metoprolol tartrate  100 mg Oral BID  . mineral oil  1 enema Rectal Once  . mineral oil  1 enema Rectal Once  . pantoprazole  40 mg Oral Q1200  . polyethylene glycol  17 g Oral Daily  . Warfarin - Pharmacist Dosing Inpatient   Does not apply q1800  .  DISCONTD: simvastatin  20 mg Oral q1800  . DISCONTD: Warfarin - Pharmacist Dosing Inpatient   Does not apply q1800   Continuous:   . sodium chloride 50 mL (07/06/11 0215)  . DISCONTD: sodium chloride 10 mL/hr (07/04/11 1326)    Assessment/Plan: Fecal impaction, now with emesis, check followup abdominal surgeries rule out small bowel obstruction. We'll discuss with GI if any intervention via scope is possible to alleviate bowel obstruction or a surgical consultation require. Recent GI bleed requiring transfusion no recurrent bleeding over the weekend. EGD unremarkable of source more than likely secondary to his known diverticulosis. Recent fall with resultant right hip fracture status post repair History of PE Asthma Hypertension.  LOS: 12 days   [name] 07/06/2011, 7:44 AM

## 2011-07-06 NOTE — Significant Event (Signed)
Pt became nauseous about 0100 today, he threw up greenish emesis. NG tube was placed and of bilious secretion was returned. NG continued to be connected to low suction. Dr Nehemiah Settle notified. Pt states that his abdomen felt better. Will continue to monitor.

## 2011-07-06 NOTE — Progress Notes (Signed)
[facility] Progress Note  Subjective: The patient developed worsening abdominal distention nausea and vomiting had an NG tube placed last night. He had at least a half a liter of fluid around and feels significantly better. However he did not have any bowel movements as a result of mineral oil enemas and did not tolerate attempts at digital disimpaction which were unsuccessful  Objective: Vital signs in last 24 hours: Temp:  [97.2 F (36.2 C)-98.7 F (37.1 C)] 98.7 F (37.1 C) (03/04 0800) Pulse Rate:  [109-160] 160  (03/04 1110) Resp:  [21-33] 33  (03/04 1110) BP: (132-144)/(70-92) 138/70 mmHg (03/04 1000) SpO2:  [89 %-95 %] 89 % (03/04 1110) Weight:  [69.5 kg (153 lb 3.5 oz)] 69.5 kg (153 lb 3.5 oz) (03/04 0400) Weight change:    PE: NG tube In place, bile stained fluid in the collection container, abdomen still quite distended the perhaps a little softer than yesterday  Lab Results: Results for orders placed during the hospital encounter of 06/24/11 (from the past 24 hour(s))  PROTIME-INR     Status: Abnormal   Collection Time   07/06/11  3:17 AM      Component Value Range   Prothrombin Time 15.6 (*) 11.6 - 15.2 (seconds)   INR 1.21  0.00 - 1.49   BASIC METABOLIC PANEL     Status: Abnormal   Collection Time   07/06/11  3:17 AM      Component Value Range   Sodium 143  135 - 145 (mEq/L)   Potassium 4.6  3.5 - 5.1 (mEq/L)   Chloride 111  96 - 112 (mEq/L)   CO2 21  19 - 32 (mEq/L)   Glucose, Bld 156 (*) 70 - 99 (mg/dL)   BUN 45 (*) 6 - 23 (mg/dL)   Creatinine, Ser 1.61 (*) 0.50 - 1.35 (mg/dL)   Calcium 8.7  8.4 - 09.6 (mg/dL)   GFR calc non Af Amer 34 (*) >90 (mL/min)   GFR calc Af Amer 40 (*) >90 (mL/min)    Studies/Results: Dg Abd Portable 1v  07/06/2011  *RADIOLOGY REPORT*  Clinical Data: Rule out small bowel obstruction.  PORTABLE ABDOMEN - 1 VIEW  Comparison:  Abdominal radiograph 07/05/2011  Findings: When compared to the recent prior examinations, there is  increasing gaseous distension throughout multiple borderline dilated and mildly dilated loops of small bowel (up to 5 cm in diameter in the left upper quadrant, and 4 cm in diameter in the right lower quadrant).  Gas is again seen throughout a mildly distended colon, with a moderate volume of stool in the rectal vault.  A nasogastric tube is seen in the stomach.  Postoperative changes of mesh repair for abdominal wall hernia are again noted overlying the abdomen.  No gross evidence of pneumoperitoneum on this single supine view.  Vascular stents in the right iliac circulation incidentally noted.  IMPRESSION: 1.  Nonspecific bowel gas pattern, with increasing small bowel dilatation, as detailed above.  Findings could be indicative of early or partial small bowel obstruction.  Clinical correlation is recommended.  This was made a call report.  Original Report Authenticated By: Florencia Reasons, M.D.   Dg Abd Portable 1v  07/06/2011  *RADIOLOGY REPORT*  Clinical data:  Abdominal distention, vomiting  ABDOMEN PORTABLE ONE-VIEW:  Comparison:  Portable exam 2345 hours compared to 07/04/2011  Findings: Coils from prior ventral hernia repair. Osseous demineralization. Orthopedic hardware proximal right femur post ORIF of intertrochanteric fracture. Lumbar scoliosis with degenerative disc disease changes.  Gaseous distention of the colon and stomach. Air filled small bowel loops. Stool present in rectum. Bowel gas pattern appears nonobstructive. No bowel wall thickening or urinary tract calcification identified. Wall stents identified at the right iliac system.  IMPRESSION: Nonobstructive bowel gas pattern with mild gaseous distention of the colon noted, question ileus.  Original Report Authenticated By: Lollie Marrow, M.D.      Assessment: Persistent fecal impaction with either independent or secondary ileus requiring NG tube  Plan: Will need consideration of disimpaction under general anesthesia, surgery  consult for this.    Tanay Massiah C 07/06/2011, 1:01 PM

## 2011-07-06 NOTE — Progress Notes (Signed)
Pt had no  bowel movement since the administration of the mineral oil enema at 2224 07/05/11.

## 2011-07-06 NOTE — Consult Note (Signed)
Reason for Consult:Fecal impaction Consulting Surgeon: Michaell Cowing Referring Physician: Madilyn Fireman   HPI: [name] is an 78 y.o. male with multiple medical problems who fell and broke his (R)hip. He was admitted and has been repaired by Ortho. He has developed some post-op rectal bleeding and was seen by GI. He has been on Coumadin and his INR got to 3.4. He had an EGD which did not show much evidence of bleeding source. He then developed worsening abd pain, though he had continued to have some bloody BMs. His abd distention worsened to the point where he vomited. He now has NGT to LIWS, and he does feel better. Some serial x-rays showed stool burden movinf from transverse colon now to rectum. Multiple enemas given, with no results. Manual disimpaction attempted with no results and was very painful. His distention has worsened. His NG is in place and he does feel some better. He states he has been passing some flatus. Dr. Madilyn Fireman feels this may require anesthesia to disimpact and has requested surgery consult.  Past Medical History:  Past Medical History  Diagnosis Date  . Asthma   . Hypertension   . Pulmonary embolism   . Peripheral artery disease   . Hypercholesteremia   . Bursitis of right shoulder   . DVT of leg (deep venous thrombosis)     Surgical History:  Past Surgical History  Procedure Date  . Hernia repair   . Appendectomy   . Femoral-femoral bypass graft   . Iliac artery stent   . Esophagogastroduodenoscopy 07/03/2011    Procedure: ESOPHAGOGASTRODUODENOSCOPY (EGD);  Surgeon: Shirley Friar, MD;  Location: [facility];  Service: Endoscopy;  Laterality: N/A;  . Compression hip screw 06/26/2011    Procedure: COMPRESSION HIP;  Surgeon: Raymon Mutton, MD;  Location: [facility];  Service: Orthopedics;  Laterality: Right;    Family History: History reviewed. No pertinent family history.  Social History:  reports that he has quit smoking. He has never used smokeless tobacco. He  reports that he does not drink alcohol or use illicit drugs.  Allergies: No Known Allergies  Medications:  Prior to Admission:  Prescriptions prior to admission  Medication Sig Dispense Refill  . clopidogrel (PLAVIX) 75 MG tablet Take 75 mg by mouth daily.      Marland Kitchen diltiazem (DILACOR XR) 180 MG 24 hr capsule Take 180 mg by mouth daily.      . metoprolol (LOPRESSOR) 100 MG tablet Take 100 mg by mouth 2 (two) times daily.      . pantoprazole (PROTONIX) 40 MG tablet Take 40 mg by mouth daily.      . simvastatin (ZOCOR) 20 MG tablet Take 10 mg by mouth every evening. Pt cuts tab in half for 10 mg dose      . warfarin (COUMADIN) 5 MG tablet Take 2.5-5 mg by mouth See admin instructions. Pt takes 1/2 of 5 mg  Tablet for 2.5 mg dose on Monday,Wednesday,Friday,sunday. On Tuesday,Thursday, Saturday pt takes 5 mg.        ROS: See HPI for pertinent findings, otherwise complete 10 system review negative.  Physical Exam: Blood pressure 129/77, pulse 138, temperature 98.7 F (37.1 C), temperature source Oral, resp. rate 26, height 5\' 6"  (1.676 m), weight 69.5 kg (153 lb 3.5 oz), SpO2 92.00%.  General Appearance:  Alert, cooperative, no distress, appears stated age  Head:  Normocephalic, without obvious abnormality, atraumatic  ENT: Unremarkable  Neck: Supple, symmetrical, trachea midline, no adenopathy, thyroid: not enlarged, symmetric, no tenderness/mass/nodules  Lungs:   Clear to auscultation bilaterally, no w/r/r, respirations unlabored without use of accessory muscles.  Chest Wall:  No tenderness or deformity  Heart:  Tachy Reg, S1, S2 normal, no murmur, rub or gallop. Carotids 2+ without bruit.  Abdomen:  Rectal:     Well distended but soft. Bowel sounds hypoactive all four quadrants,  no masses, no organomegaly. Large firm fecal burden per GI exam. I deferred as pt sitting up in chair.     Labs: CBC  Basename 07/04/11 0919 07/03/11 1344  WBC 11.3* 9.3  HGB 10.5* 6.9*  HCT 32.0* 21.3*   PLT 335 249   MET  Basename 07/06/11 0317 07/05/11 0340  NA 143 143  K 4.6 4.5  CL 111 112  CO2 21 22  GLUCOSE 156* 157*  BUN 45* 40*  CREATININE 1.84* 1.68*  CALCIUM 8.7 8.5    Basename 07/04/11 0919  PROT 6.5  ALBUMIN 2.5*  AST 19  ALT 15  ALKPHOS 77  BILITOT 0.7  BILIDIR --  IBILI --  LIPASE 52   PT/INR  Basename 07/06/11 0317 07/05/11 0340  LABPROT 15.6* 15.9*  INR 1.21 1.24   ABG No results found for this basename: PHART:2,PCO2:2,PO2:2,HCO3:2 in the last 72 hours    Dg Abd Portable 1v  07/06/2011  *RADIOLOGY REPORT*  Clinical Data: Rule out small bowel obstruction.  PORTABLE ABDOMEN - 1 VIEW  Comparison:  Abdominal radiograph 07/05/2011  Findings: When compared to the recent prior examinations, there is increasing gaseous distension throughout multiple borderline dilated and mildly dilated loops of small bowel (up to 5 cm in diameter in the left upper quadrant, and 4 cm in diameter in the right lower quadrant).  Gas is again seen throughout a mildly distended colon, with a moderate volume of stool in the rectal vault.  A nasogastric tube is seen in the stomach.  Postoperative changes of mesh repair for abdominal wall hernia are again noted overlying the abdomen.  No gross evidence of pneumoperitoneum on this single supine view.  Vascular stents in the right iliac circulation incidentally noted.  IMPRESSION: 1.  Nonspecific bowel gas pattern, with increasing small bowel dilatation, as detailed above.  Findings could be indicative of early or partial small bowel obstruction.  Clinical correlation is recommended.  This was made a call report.  Original Report Authenticated By: Florencia Reasons, M.D.   Dg Abd Portable 1v  07/06/2011  *RADIOLOGY REPORT*  Clinical data:  Abdominal distention, vomiting  ABDOMEN PORTABLE ONE-VIEW:  Comparison:  Portable exam 2345 hours compared to 07/04/2011  Findings: Coils from prior ventral hernia repair. Osseous demineralization.  Orthopedic hardware proximal right femur post ORIF of intertrochanteric fracture. Lumbar scoliosis with degenerative disc disease changes. Gaseous distention of the colon and stomach. Air filled small bowel loops. Stool present in rectum. Bowel gas pattern appears nonobstructive. No bowel wall thickening or urinary tract calcification identified. Wall stents identified at the right iliac system.  IMPRESSION: Nonobstructive bowel gas pattern with mild gaseous distention of the colon noted, question ileus.  Original Report Authenticated By: Lollie Marrow, M.D.    Assessment/Plan: Active Problems:  PE (pulmonary embolism)  Chronic anticoagulation  Fall from standing  Fracture of hip, right, closed Fecal impaction with secondary ileus/obstruction Could try SMOG enema Will d/w Dr. Michaell Cowing possibility of disimpaction under anesthesia.   Brayton El F PA-C 07/06/2011, 1:19 PM

## 2011-07-06 NOTE — Progress Notes (Signed)
Physical Therapy Treatment Patient Details Name: [name] MRN: [id number] DOB: Aug 18, 1933 Today's Date: 07/06/2011  PT Assessment/Plan  PT - Assessment/Plan Comments on Treatment Session: Able to get to [facility], then to chair.  Worse siince last session due to blood loss and now bowel obstruction.  Will hopefully begin to progress again if remains stable for treatment.  Goals updated this session due to duration met from eval and needing update with lack of progress due to complications. PT Plan: Discharge plan remains appropriate PT Frequency: Min 3X/week Follow Up Recommendations: Skilled nursing facility Equipment Recommended: Defer to next venue PT Goals  Acute Rehab PT Goals Time For Goal Achievement: 2 weeks Pt will go Supine/Side to Sit: with mod assist PT Goal: Supine/Side to Sit - Progress: Revised due to lack of progress Pt will go Sit to Stand: with mod assist PT Goal: Sit to Stand - Progress: Revised due to lack of progress Pt will go Stand to Sit: with mod assist PT Goal: Stand to Sit - Progress: Revised due to lack of progress Pt will Ambulate: 16 - 50 feet;with mod assist;with rolling walker PT Goal: Ambulate - Progress: Revised due to lack of progress Pt will Perform Home Exercise Program: with supervision, verbal cues required/provided PT Goal: Perform Home Exercise Program - Progress: Progressing toward goal  PT Treatment Precautions/Restrictions  Precautions Precautions: Fall Required Braces or Orthoses: No Restrictions Weight Bearing Restrictions: Yes RLE Weight Bearing: Weight bearing as tolerated Other Position/Activity Restrictions: Pt aware he is WBAT Mobility (including Balance) Bed Mobility Supine to Sit: 1: +2 Total assist Supine to Sit Details (indicate cue type and reason): pt=20%, cues to for technique, abd still desceded, now with NG to suction Transfers Sit to Stand: 1: +2 Total assist;With upper extremity assist;From chair/3-in-1 Sit to Stand  Details (indicate cue type and reason): with cues for safety, hand placement, pt=40% Stand to Sit: 1: +2 Total assist;To chair/3-in-1;With upper extremity assist Stand to Sit Details: cues for technique, hand placement and lowering assist, pt=40% Stand Pivot Transfers: 1: +2 Total assist Stand Pivot Transfer Details (indicate cue type and reason): from bed to 3:1; pt=30% (actually squat-pivot) Ambulation/Gait Ambulation/Gait Assistance: 1: +2 Total assist Ambulation/Gait Assistance Details (indicate cue type and reason): pt=40-50%, assist to progress left right LE and cues for sequence Ambulation Distance (Feet): 3 Feet (turning around in room 3:1 to recliner (limited w/ HR 160)) Assistive device: Eva walker Gait Pattern: Step-to pattern;Decreased dorsiflexion - right;Decreased hip/knee flexion - right;Decreased stride length  Balance Balance Assessed: Yes Static Sitting Balance Static Sitting - Balance Support: Bilateral upper extremity supported Static Sitting - Level of Assistance: 4: Min assist Static Sitting - Comment/# of Minutes: Sitting leaning posterior and sliding forward on bed; assist to scoot back and cues for right foot support on floor Exercise  Total Joint Exercises Ankle Circles/Pumps: AROM;10 reps;Supine Heel Slides: AAROM;AROM;Left;Right;10 reps;Supine Hip ABduction/ADduction: AROM;AAROM;Right;Left;10 reps End of Session PT - End of Session Equipment Utilized During Treatment: Gait belt Activity Tolerance: Patient limited by fatigue;Other (comment) (elevated HR) Patient left: in chair;with call bell in reach Nurse Communication: Mobility status for transfers General Behavior During Session: [facility] for tasks performed Cognition: [facility] for tasks performed  [facility] 07/06/2011, 12:21 PM

## 2011-07-06 NOTE — Progress Notes (Signed)
CARE MANAGEMENT NOTE 07/06/2011  Patient:  [name], [name]   Account Number:  [id number]  Date Initiated:  06/26/2011  Documentation initiated by:  Edd Arbour  Subjective/Objective Assessment:   55 m [employer] choice care was visiting his wife at [facility] PCP Dr Nehemiah Settle DX closed right hip fracture INPT Dr Ashley Royalty for Polite attending     Action/Plan:   ST SNF   Anticipated DC Date:  06/29/2011   Anticipated DC Plan:  SKILLED NURSING FACILITY  In-house referral  Clinical Social Worker      DC Planning Services  NA      [facility] Choice  NA   Choice offered to / List presented to:  NA   DME arranged  NA      DME agency  NA     HH arranged  NA      Status of service:  Completed, signed off Medicare Important Message given?   (If response is "NO", the following Medicare IM given date fields will be blank) Date Medicare IM given:   Date Additional Medicare IM given:    Discharge Disposition:    Per UR Regulation:  Reviewed for med. necessity/level of care/duration of stay  Comments:  07/06/2011 Raynelle Bring BSN CCM [phone] Per chart review;Pt was transferred from 5w to stepdown unit 07/03/11 d/t rectal bleeding. pt remains in stepdown. Current plans are for ST SNF/CSW is following patient for snf placemnt. CM signing off

## 2011-07-07 DIAGNOSIS — K5641 Fecal impaction: Secondary | ICD-10-CM | POA: Diagnosis present

## 2011-07-07 DIAGNOSIS — K5909 Other constipation: Secondary | ICD-10-CM | POA: Diagnosis present

## 2011-07-07 DIAGNOSIS — K56609 Unspecified intestinal obstruction, unspecified as to partial versus complete obstruction: Secondary | ICD-10-CM

## 2011-07-07 LAB — BASIC METABOLIC PANEL
BUN: 42 mg/dL — ABNORMAL HIGH (ref 6–23)
CO2: 21 mEq/L (ref 19–32)
Chloride: 112 mEq/L (ref 96–112)
GFR calc Af Amer: 41 mL/min — ABNORMAL LOW (ref >90)
Potassium: 4.2 mEq/L (ref 3.5–5.1)

## 2011-07-07 LAB — HEMOGLOBIN AND HEMATOCRIT, BLOOD: Hemoglobin: 10 g/dL — ABNORMAL LOW (ref 13.0–17.0)

## 2011-07-07 LAB — PROTIME-INR: INR: 1.21 (ref 0.00–1.49)

## 2011-07-07 MED ORDER — MORPHINE SULFATE 2 MG/ML IJ SOLN
2.0000 mg | INTRAMUSCULAR | Status: DC | PRN
  Administered 2011-07-07 – 2011-07-09 (×4): 2 mg via INTRAVENOUS
  Filled 2011-07-07 (×4): qty 1

## 2011-07-07 MED ORDER — BISACODYL 10 MG RE SUPP: 10.0000 mg | Freq: Two times a day (BID) | RECTAL | Status: DC | PRN

## 2011-07-07 MED ORDER — LACTATED RINGERS IV BOLUS (SEPSIS)
1000.0000 mL | Freq: Once | INTRAVENOUS | Status: AC
  Administered 2011-07-07: 1000 mL via INTRAVENOUS

## 2011-07-07 MED ORDER — MENTHOL 3 MG MT LOZG
1.0000 | LOZENGE | OROMUCOSAL | Status: DC | PRN
  Filled 2011-07-07: qty 9

## 2011-07-07 MED ORDER — PHENOL 1.4 % MT LIQD: 2.0000 | OROMUCOSAL | Status: DC | PRN

## 2011-07-07 MED ORDER — MAGIC MOUTHWASH
15.0000 mL | Freq: Four times a day (QID) | ORAL | Status: DC | PRN
  Filled 2011-07-07: qty 15

## 2011-07-07 MED ORDER — LIP MEDEX EX OINT
1.0000 "application " | TOPICAL_OINTMENT | Freq: Two times a day (BID) | CUTANEOUS | Status: DC
  Administered 2011-07-07 – 2011-08-04 (×43): 1 via TOPICAL
  Filled 2011-07-07: qty 7

## 2011-07-07 NOTE — Progress Notes (Signed)
Pt feels better w NGT in - less crampy abd pain No nausea.  Wants sips/ice chips Better s/p Foley in  BNo flatus/BM  Rather distended but no peritonitis  CT scan c/w SBO Mod vol stool in rectum - not massive.  Would hold off on any disimpaction as that is not the cause of his SBO There is no evidence of peritonitis, acute abdomen, nor shock.  There is no strong evidence of failure of improvement nor decline with current non-operative management.  There is no need for surgery at the present moment.  We will continue to follow.   Inc HR prob secondary to CCB & BBlocker w/d - not hypotensive.  Try fluid bolus to help w prob dehydration & consider readding metoprolol since IV dilt not enough  ?IV heparin w h/o PE?  NPO for a while.  If NPO > 7 days, will need TNA.

## 2011-07-07 NOTE — Progress Notes (Signed)
Subjective: Patient still of abdominal discomfort, he still has not had a bowel movement. Surgery has consult on the patient, CT scan reviewed showing small bowel obstruction, gas throughout the colon. Also shows distended bladder  Objective: Vital signs in last 24 hours: Temp:  [97.5 F (36.4 C)-100.1 F (37.8 C)] 98.8 F (37.1 C) (03/05 0412) Pulse Rate:  [136-160] 137  (03/04 1700) Resp:  [23-33] 23  (03/04 1700) BP: (129-149)/(67-77) 142/76 mmHg (03/05 0600) SpO2:  [89 %-94 %] 94 % (03/04 1700) Weight:  [67.3 kg (148 lb 5.9 oz)] 67.3 kg (148 lb 5.9 oz) (03/05 0412) Weight change: -2.2 kg (-4 lb 13.6 oz) Last BM Date: 07/02/11 (blood per rectum)  Intake/Output from previous day: 03/04 0701 - 03/05 0700 In: 705 [I.V.:705] Out: 2425 [Urine:1075; Emesis/NG output:1350] Intake/Output this shift:    General appearance: alert and cooperative Resp: clear to auscultation bilaterally Cardio: tachycardic but regular GI: abdomen distended, tympanic Extremities: no edema, no ecchymosis or effusion at surgical site  Lab Results:  Results for orders placed during the hospital encounter of 06/24/11 (from the past 24 hour(s))  PROTIME-INR     Status: Abnormal   Collection Time   07/07/11  3:35 AM      Component Value Range   Prothrombin Time 15.6 (*) 11.6 - 15.2 (seconds)   INR 1.21  0.00 - 1.49   BASIC METABOLIC PANEL     Status: Abnormal   Collection Time   07/07/11  3:35 AM      Component Value Range   Sodium 146 (*) 135 - 145 (mEq/L)   Potassium 4.2  3.5 - 5.1 (mEq/L)   Chloride 112  96 - 112 (mEq/L)   CO2 21  19 - 32 (mEq/L)   Glucose, Bld 134 (*) 70 - 99 (mg/dL)   BUN 42 (*) 6 - 23 (mg/dL)   Creatinine, Ser 1.19 (*) 0.50 - 1.35 (mg/dL)   Calcium 8.3 (*) 8.4 - 10.5 (mg/dL)   GFR calc non Af Amer 35 (*) >90 (mL/min)   GFR calc Af Amer 41 (*) >90 (mL/min)      Studies/Results: Ct Abdomen Pelvis W Contrast  07/07/2011  *RADIOLOGY REPORT*  Clinical Data: Small bowel  obstruction, question abscess, rectal bleeding  CT ABDOMEN AND PELVIS WITH CONTRAST  Technique:  Multidetector CT imaging of the abdomen and pelvis was performed following the standard protocol during bolus administration of intravenous contrast. Sagittal and coronal MPR images reconstructed from axial data set.  Contrast: Dilute oral contrast, 50 ml Omnipaque-300 IV  Comparison: 01/28/2009  Findings: Bibasilar pleural effusions atelectasis. Nasogastric tube in stomach. Multiple hepatic cysts. No additional focal abnormalities of the liver, spleen, pancreas, or adrenal glands. Distended urinary bladder. Dilatation of renal collecting systems/ureters, question due to bladder distention. Scattered atherosclerotic calcifications with prior stenting of the right iliac system.  Small amount of free pelvic fluid at the base of the left pericolic gutter. Scattered fluid and gas throughout the minimally distended colon to rectum. Dilated proximal and decompressed distal small bowel loops compatible with small bowel obstruction. Transition from dilated to nondilated small bowel loops occurs in the left mid pelvis, question adhesion. Prior ventral hernia repair. No bowel wall thickening, abscess or free intraperitoneal air.  No mass, adenopathy, or hernia. Bones appear diffusely demineralized. Orthopedic hardware proximal right femur across an intertrochanteric fracture. Degenerative disc disease changes and scoliosis lumbar spine.  IMPRESSION: Distended urinary bladder, which may account for ureteral dilatation and renal collecting system dilatation bilaterally. Small bowel  obstruction with transition from dilated to nondilated small bowel occurring in the left mid pelvis, question related to adhesion. Gas and fluid throughout the colon, which could be related to diarrhea or ileus. Hepatic cysts. Bibasilar effusions atelectasis.  Post ORIF intertrochanteric fracture right femur.  Original Report Authenticated By: Lollie Marrow, M.D.   Dg Abd Portable 1v  07/06/2011  *RADIOLOGY REPORT*  Clinical Data: Rule out small bowel obstruction.  PORTABLE ABDOMEN - 1 VIEW  Comparison:  Abdominal radiograph 07/05/2011  Findings: When compared to the recent prior examinations, there is increasing gaseous distension throughout multiple borderline dilated and mildly dilated loops of small bowel (up to 5 cm in diameter in the left upper quadrant, and 4 cm in diameter in the right lower quadrant).  Gas is again seen throughout a mildly distended colon, with a moderate volume of stool in the rectal vault.  A nasogastric tube is seen in the stomach.  Postoperative changes of mesh repair for abdominal wall hernia are again noted overlying the abdomen.  No gross evidence of pneumoperitoneum on this single supine view.  Vascular stents in the right iliac circulation incidentally noted.  IMPRESSION: 1.  Nonspecific bowel gas pattern, with increasing small bowel dilatation, as detailed above.  Findings could be indicative of early or partial small bowel obstruction.  Clinical correlation is recommended.  This was made a call report.  Original Report Authenticated By: Florencia Reasons, M.D.   Dg Abd Portable 1v  07/06/2011  *RADIOLOGY REPORT*  Clinical data:  Abdominal distention, vomiting  ABDOMEN PORTABLE ONE-VIEW:  Comparison:  Portable exam 2345 hours compared to 07/04/2011  Findings: Coils from prior ventral hernia repair. Osseous demineralization. Orthopedic hardware proximal right femur post ORIF of intertrochanteric fracture. Lumbar scoliosis with degenerative disc disease changes. Gaseous distention of the colon and stomach. Air filled small bowel loops. Stool present in rectum. Bowel gas pattern appears nonobstructive. No bowel wall thickening or urinary tract calcification identified. Wall stents identified at the right iliac system.  IMPRESSION: Nonobstructive bowel gas pattern with mild gaseous distention of the colon noted, question  ileus.  Original Report Authenticated By: Lollie Marrow, M.D.    Medications:  Prior to Admission:  Prescriptions prior to admission  Medication Sig Dispense Refill  . clopidogrel (PLAVIX) 75 MG tablet Take 75 mg by mouth daily.      Marland Kitchen diltiazem (DILACOR XR) 180 MG 24 hr capsule Take 180 mg by mouth daily.      . metoprolol (LOPRESSOR) 100 MG tablet Take 100 mg by mouth 2 (two) times daily.      . pantoprazole (PROTONIX) 40 MG tablet Take 40 mg by mouth daily.      . simvastatin (ZOCOR) 20 MG tablet Take 10 mg by mouth every evening. Pt cuts tab in half for 10 mg dose      . warfarin (COUMADIN) 5 MG tablet Take 2.5-5 mg by mouth See admin instructions. Pt takes 1/2 of 5 mg  Tablet for 2.5 mg dose on Monday,Wednesday,Friday,sunday. On Tuesday,Thursday, Saturday pt takes 5 mg.       Scheduled:   . atorvastatin  10 mg Oral q1800  . metoprolol      . pantoprazole (PROTONIX) IV  40 mg Intravenous Q24H  . Warfarin - Pharmacist Dosing Inpatient   Does not apply q1800  . DISCONTD: docusate sodium  100 mg Oral BID  . DISCONTD: pantoprazole  40 mg Oral Q1200  . DISCONTD: polyethylene glycol  17 g Oral Daily  Continuous:   . sodium chloride 50 mL/hr at 07/06/11 0803  . diltiazem (CARDIZEM) infusion 15 mg/hr (07/07/11 0230)    Assessment/Plan: Small bowel obstruction, fecal impaction, distended colon. We'll discuss with surgery next option for now continue NG tube IV fluids. Recent GI bleed, no recurrence over last 2-3 days, hemoglobin stable. EGD negative for source, previous colonoscopy only showed diverticulosis. Prior to hospitalization tolerated Coumadin. Status post fall, status post repair of hip fracture Hypertension Acute on chronic renal insufficiency History of PE, Off Coumadin secondary to bleeding. Patient high risk for needing surgery therefore we'll continue PAS hose.   LOS: 13 days   Harlowe Dowler D 07/07/2011, 8:25 AM

## 2011-07-07 NOTE — Progress Notes (Signed)
4 Days Post-Op  Subjective: Alert, talking, abdomen very distended. Afebrile, tachycardic, resp rate 20's,  550 ml urine output, and 825 ml thru the NG.  Objective: Vital signs in last 24 hours: Temp:  [97.5 F (36.4 C)-100.1 F (37.8 C)] 98.8 F (37.1 C) (03/05 0412) Pulse Rate:  [136-160] 137  (03/04 1700) Resp:  [23-33] 23  (03/04 1700) BP: (129-149)/(67-79) 142/76 mmHg (03/05 0600) SpO2:  [89 %-94 %] 94 % (03/04 1700) Weight:  [67.3 kg (148 lb 5.9 oz)] 67.3 kg (148 lb 5.9 oz) (03/05 0412) Last BM Date: 07/02/11 (blood per rectum)  Intake/Output from previous day: 03/04 0701 - 03/05 0700 In: 705 [I.V.:705] Out: 1375 [Urine:550; Emesis/NG output:825] Intake/Output this shift:    PE:  Alert talking, Abd:  Very distended, tense, CT findings below.  Not really tender. Tachycardic HR 123  BP 142/76  Lab Results:   Basename 07/04/11 0919  WBC 11.3*  HGB 10.5*  HCT 32.0*  PLT 335    BMET  Basename 07/07/11 0335 07/06/11 0317  NA 146* 143  K 4.2 4.6  CL 112 111  CO2 21 21  GLUCOSE 134* 156*  BUN 42* 45*  CREATININE 1.79* 1.84*  CALCIUM 8.3* 8.7   PT/INR  Basename 07/07/11 0335 07/06/11 0317  LABPROT 15.6* 15.6*  INR 1.21 1.21     Studies/Results: Ct Abdomen Pelvis W Contrast  07/07/2011  *RADIOLOGY REPORT*  Clinical Data: Small bowel obstruction, question abscess, rectal bleeding  CT ABDOMEN AND PELVIS WITH CONTRAST  Technique:  Multidetector CT imaging of the abdomen and pelvis was performed following the standard protocol during bolus administration of intravenous contrast. Sagittal and coronal MPR images reconstructed from axial data set.  Contrast: Dilute oral contrast, 50 ml Omnipaque-300 IV  Comparison: 01/28/2009  Findings: Bibasilar pleural effusions atelectasis. Nasogastric tube in stomach. Multiple hepatic cysts. No additional focal abnormalities of the liver, spleen, pancreas, or adrenal glands. Distended urinary bladder. Dilatation of renal collecting  systems/ureters, question due to bladder distention. Scattered atherosclerotic calcifications with prior stenting of the right iliac system.  Small amount of free pelvic fluid at the base of the left pericolic gutter. Scattered fluid and gas throughout the minimally distended colon to rectum. Dilated proximal and decompressed distal small bowel loops compatible with small bowel obstruction. Transition from dilated to nondilated small bowel loops occurs in the left mid pelvis, question adhesion. Prior ventral hernia repair. No bowel wall thickening, abscess or free intraperitoneal air.  No mass, adenopathy, or hernia. Bones appear diffusely demineralized. Orthopedic hardware proximal right femur across an intertrochanteric fracture. Degenerative disc disease changes and scoliosis lumbar spine.  IMPRESSION: Distended urinary bladder, which may account for ureteral dilatation and renal collecting system dilatation bilaterally. Small bowel obstruction with transition from dilated to nondilated small bowel occurring in the left mid pelvis, question related to adhesion. Gas and fluid throughout the colon, which could be related to diarrhea or ileus. Hepatic cysts. Bibasilar effusions atelectasis.  Post ORIF intertrochanteric fracture right femur.  Original Report Authenticated By: Lollie Marrow, M.D.   Dg Abd Portable 1v  07/06/2011  *RADIOLOGY REPORT*  Clinical Data: Rule out small bowel obstruction.  PORTABLE ABDOMEN - 1 VIEW  Comparison:  Abdominal radiograph 07/05/2011  Findings: When compared to the recent prior examinations, there is increasing gaseous distension throughout multiple borderline dilated and mildly dilated loops of small bowel (up to 5 cm in diameter in the left upper quadrant, and 4 cm in diameter in the right lower quadrant).  Gas is again seen throughout a mildly distended colon, with a moderate volume of stool in the rectal vault.  A nasogastric tube is seen in the stomach.  Postoperative changes  of mesh repair for abdominal wall hernia are again noted overlying the abdomen.  No gross evidence of pneumoperitoneum on this single supine view.  Vascular stents in the right iliac circulation incidentally noted.  IMPRESSION: 1.  Nonspecific bowel gas pattern, with increasing small bowel dilatation, as detailed above.  Findings could be indicative of early or partial small bowel obstruction.  Clinical correlation is recommended.  This was made a call report.  Original Report Authenticated By: Florencia Reasons, M.D.   Dg Abd Portable 1v  07/06/2011  *RADIOLOGY REPORT*  Clinical data:  Abdominal distention, vomiting  ABDOMEN PORTABLE ONE-VIEW:  Comparison:  Portable exam 2345 hours compared to 07/04/2011  Findings: Coils from prior ventral hernia repair. Osseous demineralization. Orthopedic hardware proximal right femur post ORIF of intertrochanteric fracture. Lumbar scoliosis with degenerative disc disease changes. Gaseous distention of the colon and stomach. Air filled small bowel loops. Stool present in rectum. Bowel gas pattern appears nonobstructive. No bowel wall thickening or urinary tract calcification identified. Wall stents identified at the right iliac system.  IMPRESSION: Nonobstructive bowel gas pattern with mild gaseous distention of the colon noted, question ileus.  Original Report Authenticated By: Lollie Marrow, M.D.    Anti-infectives: Anti-infectives     Start     Dose/Rate Route Frequency Ordered Stop   06/26/11 2000   ceFAZolin (ANCEF) IVPB 1 g/50 mL premix        1 g 100 mL/hr over 30 Minutes Intravenous Every 6 hours 06/26/11 1658 06/27/11 0857         Current Facility-Administered Medications  Medication Dose Route Frequency Provider Last Rate Last Dose  . 0.9 %  sodium chloride infusion   Intravenous Continuous Katy Apo, MD 50 mL/hr at 07/06/11 0803    . acetaminophen (TYLENOL) tablet 650 mg  650 mg Oral Q6H PRN Katy Apo, MD      . atorvastatin (LIPITOR)  tablet 10 mg  10 mg Oral q1800 Katy Apo, MD   10 mg at 07/05/11 1730  . diltiazem (CARDIZEM) 100 mg in dextrose 5 % 100 mL infusion  5-15 mg/hr Intravenous Titrated Katy Apo, MD 15 mL/hr at 07/07/11 0230 15 mg/hr at 07/07/11 0230  . HYDROcodone-acetaminophen (NORCO) 5-325 MG per tablet 2 tablet  2 tablet Oral Q4H PRN Katy Apo, MD   2 tablet at 07/05/11 0901  . iohexol (OMNIPAQUE) 300 MG/ML solution 100 mL  100 mL Intravenous Once PRN Medication Radiologist, MD   50 mL at 07/06/11 2053  . iohexol (OMNIPAQUE) 300 MG/ML solution 80 mL  80 mL Intravenous Once PRN Medication Radiologist, MD      . methocarbamol (ROBAXIN) 500 mg in dextrose 5 % 50 mL IVPB  500 mg Intravenous Q6H PRN [name], PA      . metoCLOPramide (REGLAN) injection 5-10 mg  5-10 mg Intravenous Q8H PRN [name], PA   5 mg at 07/05/11 1730  . metoprolol (LOPRESSOR) 1 MG/ML injection           . morphine 2 MG/ML injection 2 mg  2 mg Intravenous Q4H PRN Duke Salvia, MD   2 mg at 07/07/11 0138  . ondansetron (ZOFRAN) injection 4 mg  4 mg Intravenous Q6H PRN Michelle A. Ashley Royalty, MD   4 mg at 07/05/11 1448  .  pantoprazole (PROTONIX) injection 40 mg  40 mg Intravenous Q24H Katy Apo, MD   40 mg at 07/06/11 1155  . Warfarin - Pharmacist Dosing Inpatient   Does not apply [id number] Katy Apo, MD      . DISCONTD: diltiazem (CARDIZEM CD) 24 hr capsule 180 mg  180 mg Oral Daily Katy Apo, MD   180 mg at 07/05/11 1227  . DISCONTD: docusate sodium (COLACE) capsule 100 mg  100 mg Oral BID Katy Apo, MD   100 mg at 07/05/11 2226  . DISCONTD: metoprolol (LOPRESSOR) injection 5 mg  5 mg Intravenous Q6H PRN Katy Apo, MD   5 mg at 07/06/11 2956  . DISCONTD: metoprolol (LOPRESSOR) tablet 100 mg  100 mg Oral BID Katy Apo, MD   100 mg at 07/05/11 2226  . DISCONTD: pantoprazole (PROTONIX) EC tablet 40 mg  40 mg Oral Q1200 Katy Apo, MD   40 mg at 07/05/11 1147  . DISCONTD:  polyethylene glycol (MIRALAX / GLYCOLAX) packet 17 g  17 g Oral Daily Katy Apo, MD   17 g at 07/05/11 0900    Assessment/Plan PE (pulmonary embolism)  Chronic anticoagulation  Fall from standing  Fracture of hip, right, closed CT showing SBO, gas and fluid throughout colon; possible ileus or diarrhea related.  Distended Urinary bladder, with ureteral dilatation and renal collecting system dilatations.   Discussed with DR. Gross this AM, recommend NPO, NG decompression, Insert foley, IV hydration. Would see how he does with SBO treatment for now.       LOS: 13 days    [name] 07/07/2011

## 2011-07-07 NOTE — Progress Notes (Signed)
ANTICOAGULATION CONSULT NOTE - Follow Up Consult  Pharmacy Consult for Warfarin Indication: Hx of PE, s/p ORIF 2/22 No Known Allergies  Patient Measurements: Height: 5\' 6"  (167.6 cm) Weight: 148 lb 5.9 oz (67.3 kg) IBW/kg (Calculated) : 63.8   Vital Signs: Temp: 98.7 F (37.1 C) (03/05 1200) Temp src: Oral (03/05 1200) BP: 130/67 mmHg (03/05 1200) Pulse Rate: 117  (03/05 1200)  Labs:  Basename 07/07/11 0910 07/07/11 0335 07/06/11 0317 07/05/11 0340  HGB 10.0* -- -- --  HCT 30.9* -- -- --  PLT -- -- -- --  APTT -- -- -- --  LABPROT -- 15.6* 15.6* 15.9*  INR -- 1.21 1.21 1.24  HEPARINUNFRC -- -- -- --  CREATININE -- 1.79* 1.84* 1.68*  CKTOTAL -- -- -- --  CKMB -- -- -- --  TROPONINI -- -- -- --   Estimated Creatinine Clearance: 32.2 ml/min (by C-G formula based on Cr of 1.79).   Assessment:  Chronic Coumadin for hx of PE, home dose 2.5mg  MWF, 5mg  other days  ORIF R hip 2/22, Coumadin resumed, INR therapeutic 2/25-2/27, above goal 2/28 and 3/1 (3.44)  Coumadin held for rectal bleed, Vit K, transfusion.  EGD 3/1 benign, No further signs of bleeding - CT showing SBO and high risk for needing surgery soon so will continue SCD's and hold coumadin for now per MD orders.   INR remains WNL today at 1.2.  Hgb stable.   Goal of Therapy:  INR 2-3 when/if Coumadin resumed   Plan:  Continue to hold Coumadin, await plan for anticoagulation. SCD's ordered Pharmacy will sign off, monitor peripherally, and wait for any new anticoagulation orders.   Iantha Titsworth, Loma Messing PharmD Pager [phone] 07/07/2011,12:27 PM

## 2011-07-07 NOTE — Consult Note (Signed)
-  Rectal stool is unlikely source of obstruction -Would rec CT abd/pelvis to see if SBO exists, r/o other etiologies -GI bleeding w/u per GI.  If scant BRBPR prob from rectal trauma. -If no one can handle disimpacting the patient, I guess we can do it.. (I would eval for SBO 1st)

## 2011-07-07 NOTE — Progress Notes (Signed)
[facility] Progress Note  Subjective: Patient currently asleep and I did not arouse him. Surgical consult and CT results noted.  Objective: Vital signs in last 24 hours: Temp:  [97.5 F (36.4 C)-100.1 F (37.8 C)] 97.8 F (36.6 C) (03/05 0800) Pulse Rate:  [118-160] 122  (03/05 1000) Resp:  [18-33] 21  (03/05 1000) BP: (129-158)/(64-77) 149/64 mmHg (03/05 1000) SpO2:  [89 %-95 %] 94 % (03/05 1000) Weight:  [67.3 kg (148 lb 5.9 oz)] 67.3 kg (148 lb 5.9 oz) (03/05 0412) Weight change: -2.2 kg (-4 lb 13.6 oz)   PE: Deferred today  Lab Results: Results for orders placed during the hospital encounter of 06/24/11 (from the past 24 hour(s))  PROTIME-INR     Status: Abnormal   Collection Time   07/07/11  3:35 AM      Component Value Range   Prothrombin Time 15.6 (*) 11.6 - 15.2 (seconds)   INR 1.21  0.00 - 1.49   BASIC METABOLIC PANEL     Status: Abnormal   Collection Time   07/07/11  3:35 AM      Component Value Range   Sodium 146 (*) 135 - 145 (mEq/L)   Potassium 4.2  3.5 - 5.1 (mEq/L)   Chloride 112  96 - 112 (mEq/L)   CO2 21  19 - 32 (mEq/L)   Glucose, Bld 134 (*) 70 - 99 (mg/dL)   BUN 42 (*) 6 - 23 (mg/dL)   Creatinine, Ser 0.98 (*) 0.50 - 1.35 (mg/dL)   Calcium 8.3 (*) 8.4 - 10.5 (mg/dL)   GFR calc non Af Amer 35 (*) >90 (mL/min)   GFR calc Af Amer 41 (*) >90 (mL/min)  HEMOGLOBIN AND HEMATOCRIT, BLOOD     Status: Abnormal   Collection Time   07/07/11  9:10 AM      Component Value Range   Hemoglobin 10.0 (*) 13.0 - 17.0 (g/dL)   HCT 11.9 (*) 14.7 - 52.0 (%)    Studies/Results: Ct Abdomen Pelvis W Contrast  07/07/2011  *RADIOLOGY REPORT*  Clinical Data: Small bowel obstruction, question abscess, rectal bleeding  CT ABDOMEN AND PELVIS WITH CONTRAST  Technique:  Multidetector CT imaging of the abdomen and pelvis was performed following the standard protocol during bolus administration of intravenous contrast. Sagittal and coronal MPR images reconstructed from axial  data set.  Contrast: Dilute oral contrast, 50 ml Omnipaque-300 IV  Comparison: 01/28/2009  Findings: Bibasilar pleural effusions atelectasis. Nasogastric tube in stomach. Multiple hepatic cysts. No additional focal abnormalities of the liver, spleen, pancreas, or adrenal glands. Distended urinary bladder. Dilatation of renal collecting systems/ureters, question due to bladder distention. Scattered atherosclerotic calcifications with prior stenting of the right iliac system.  Small amount of free pelvic fluid at the base of the left pericolic gutter. Scattered fluid and gas throughout the minimally distended colon to rectum. Dilated proximal and decompressed distal small bowel loops compatible with small bowel obstruction. Transition from dilated to nondilated small bowel loops occurs in the left mid pelvis, question adhesion. Prior ventral hernia repair. No bowel wall thickening, abscess or free intraperitoneal air.  No mass, adenopathy, or hernia. Bones appear diffusely demineralized. Orthopedic hardware proximal right femur across an intertrochanteric fracture. Degenerative disc disease changes and scoliosis lumbar spine.  IMPRESSION: Distended urinary bladder, which may account for ureteral dilatation and renal collecting system dilatation bilaterally. Small bowel obstruction with transition from dilated to nondilated small bowel occurring in the left mid pelvis, question related to adhesion. Gas and fluid throughout the  colon, which could be related to diarrhea or ileus. Hepatic cysts. Bibasilar effusions atelectasis.  Post ORIF intertrochanteric fracture right femur.  Original Report Authenticated By: Lollie Marrow, M.D.   Dg Abd Portable 1v  07/06/2011  *RADIOLOGY REPORT*  Clinical Data: Rule out small bowel obstruction.  PORTABLE ABDOMEN - 1 VIEW  Comparison:  Abdominal radiograph 07/05/2011  Findings: When compared to the recent prior examinations, there is increasing gaseous distension throughout multiple  borderline dilated and mildly dilated loops of small bowel (up to 5 cm in diameter in the left upper quadrant, and 4 cm in diameter in the right lower quadrant).  Gas is again seen throughout a mildly distended colon, with a moderate volume of stool in the rectal vault.  A nasogastric tube is seen in the stomach.  Postoperative changes of mesh repair for abdominal wall hernia are again noted overlying the abdomen.  No gross evidence of pneumoperitoneum on this single supine view.  Vascular stents in the right iliac circulation incidentally noted.  IMPRESSION: 1.  Nonspecific bowel gas pattern, with increasing small bowel dilatation, as detailed above.  Findings could be indicative of early or partial small bowel obstruction.  Clinical correlation is recommended.  This was made a call report.  Original Report Authenticated By: Florencia Reasons, M.D.   Dg Abd Portable 1v  07/06/2011  *RADIOLOGY REPORT*  Clinical data:  Abdominal distention, vomiting  ABDOMEN PORTABLE ONE-VIEW:  Comparison:  Portable exam 2345 hours compared to 07/04/2011  Findings: Coils from prior ventral hernia repair. Osseous demineralization. Orthopedic hardware proximal right femur post ORIF of intertrochanteric fracture. Lumbar scoliosis with degenerative disc disease changes. Gaseous distention of the colon and stomach. Air filled small bowel loops. Stool present in rectum. Bowel gas pattern appears nonobstructive. No bowel wall thickening or urinary tract calcification identified. Wall stents identified at the right iliac system.  IMPRESSION: Nonobstructive bowel gas pattern with mild gaseous distention of the colon noted, question ileus.  Original Report Authenticated By: Lollie Marrow, M.D.      Assessment: Small bowel obstruction NG placement, exact nature unclear Fecal impaction  Plan: Agree with surgeries management. Will continue to follow with you.    Arlissa Monteverde C 07/07/2011, 10:50 AM

## 2011-07-08 LAB — BASIC METABOLIC PANEL
Calcium: 8.2 mg/dL — ABNORMAL LOW (ref 8.4–10.5)
Chloride: 115 mEq/L — ABNORMAL HIGH (ref 96–112)
Creatinine, Ser: 1.47 mg/dL — ABNORMAL HIGH (ref 0.50–1.35)
GFR calc Af Amer: 52 mL/min — ABNORMAL LOW (ref >90)

## 2011-07-08 LAB — PROTIME-INR
INR: 1.21 (ref 0.00–1.49)
Prothrombin Time: 15.6 seconds — ABNORMAL HIGH (ref 11.6–15.2)

## 2011-07-08 MED ORDER — BIOTENE DRY MOUTH MT LIQD
15.0000 mL | Freq: Two times a day (BID) | OROMUCOSAL | Status: DC
  Administered 2011-07-08 – 2011-08-01 (×39): 15 mL via OROMUCOSAL

## 2011-07-08 MED ORDER — HEPARIN SODIUM (PORCINE) 5000 UNIT/ML IJ SOLN
5000.0000 [IU] | Freq: Three times a day (TID) | INTRAMUSCULAR | Status: DC
  Administered 2011-07-08 – 2011-07-10 (×5): 5000 [IU] via SUBCUTANEOUS
  Filled 2011-07-08 (×12): qty 1

## 2011-07-08 MED ORDER — CHLORHEXIDINE GLUCONATE 0.12 % MT SOLN
15.0000 mL | Freq: Two times a day (BID) | OROMUCOSAL | Status: DC
  Administered 2011-07-08 – 2011-08-01 (×47): 15 mL via OROMUCOSAL
  Filled 2011-07-08 (×52): qty 15

## 2011-07-08 NOTE — Progress Notes (Signed)
Occupational Therapy Treatment Patient Details Name: [name] MRN: [id number] DOB: Jan 04, 1934 Today's Date: 07/08/2011  OT Assessment/Plan OT Assessment/Plan Comments on Treatment Session: Significant improvement with mobilization. Still recommend st snf OT Plan: Discharge plan remains appropriate OT Frequency: Min 1X/week Follow Up Recommendations: Skilled nursing facility Equipment Recommended: Defer to next venue OT Goals ADL Goals Pt Will Transfer to Toilet: with min assist ADL Goal: Toilet Transfer - Progress: Updated due to goal met  OT Treatment Precautions/Restrictions  Precautions Precautions: Fall Restrictions Weight Bearing Restrictions: No RLE Weight Bearing: Weight bearing as tolerated   ADL ADL Toilet Transfer: Simulated;Moderate assistance Toilet Transfer Details (indicate cue type and reason): + 2 for safety and lines. Pt stood for approximately 2 minutes. Cues for posture and hand placement. Toilet Transfer Method: Stand pivot Mobility  Bed Mobility Bed Mobility: Yes Sit to Supine: 1: +2 Total assist;HOB flat;Patient percentage (comment) (Pt 50% A needed for trunk and BLES.) Transfers Sit to Stand: From chair/3-in-1;3: Mod assist Sit to Stand Details (indicate cue type and reason): +2 for safety, pt kept R LE forward and demonstrated decreased WBing on R Stand to Sit: 4: Min assist;With armrests;To chair/3-in-1 Stand to Sit Details: +2 for safety, verbal cue for armrests to control descent, R LE forward Exercises   End of Session OT - End of Session Activity Tolerance: Patient limited by pain;Treatment limited secondary to medical complications (Comment) (severely distended abdomen. RN aware.) Patient left: in bed;with call bell in reach General Behavior During Session: [facility] for tasks performed Cognition: [facility] for tasks performed  Aradia Estey A, OTR/L [phone]  07/08/2011, 3:33 PM

## 2011-07-08 NOTE — Progress Notes (Signed)
CSW continues to follow this pt to assist with d/c planning. Plan remains for pt to d/c to [facility] when stable. CSW will assist with insurance auth process ( [employer] ). [phone] )

## 2011-07-08 NOTE — Progress Notes (Signed)
Subjective: Patient had several bowel movements last night, abdomen less distended. NG tube still in place with output. Followup abdominal series pending. Hemoglobin stable with no recurrent bleeding.  Objective: Vital signs in last 24 hours: Temp:  [97.2 F (36.2 C)-98.4 F (36.9 C)] 97.2 F (36.2 C) (03/06 0800) Pulse Rate:  [104-122] 116  (03/06 0800) Resp:  [16-25] 19  (03/06 0800) BP: (118-143)/(54-68) 138/68 mmHg (03/06 0800) SpO2:  [93 %-96 %] 96 % (03/06 0800) Weight:  [64.4 kg (141 lb 15.6 oz)] 64.4 kg (141 lb 15.6 oz) (03/06 0400) Weight change: -2.9 kg (-6 lb 6.3 oz) Last BM Date: 07/08/11  Intake/Output from previous day: 03/05 0701 - 03/06 0700 In: 3670 [P.O.:360; I.V.:2310; IV Piggyback:1000] Out: 3730 [Urine:3135; Emesis/NG output:590; Stool:5] Intake/Output this shift: Total I/O In: 345 [I.V.:345] Out: 315 [Urine:315]  General appearance: alert and cooperative Resp: clear to auscultation bilaterally Cardio: regular rate and rhythm, S1, S2 normal, no murmur, click, rub or gallop GI: less distention, no palpable splenomegaly no pain with palpation  Lab Results:  Results for orders placed during the hospital encounter of 06/24/11 (from the past 24 hour(s))  PROTIME-INR     Status: Abnormal   Collection Time   07/08/11  3:40 AM      Component Value Range   Prothrombin Time 15.6 (*) 11.6 - 15.2 (seconds)   INR 1.21  0.00 - 1.49   BASIC METABOLIC PANEL     Status: Abnormal   Collection Time   07/08/11  3:40 AM      Component Value Range   Sodium 148 (*) 135 - 145 (mEq/L)   Potassium 3.4 (*) 3.5 - 5.1 (mEq/L)   Chloride 115 (*) 96 - 112 (mEq/L)   CO2 21  19 - 32 (mEq/L)   Glucose, Bld 93  70 - 99 (mg/dL)   BUN 23  6 - 23 (mg/dL)   Creatinine, Ser 8.29 (*) 0.50 - 1.35 (mg/dL)   Calcium 8.2 (*) 8.4 - 10.5 (mg/dL)   GFR calc non Af Amer 45 (*) >90 (mL/min)   GFR calc Af Amer 52 (*) >90 (mL/min)      Studies/Results: Ct Abdomen Pelvis W Contrast  07/07/2011   *RADIOLOGY REPORT*  Clinical Data: Small bowel obstruction, question abscess, rectal bleeding  CT ABDOMEN AND PELVIS WITH CONTRAST  Technique:  Multidetector CT imaging of the abdomen and pelvis was performed following the standard protocol during bolus administration of intravenous contrast. Sagittal and coronal MPR images reconstructed from axial data set.  Contrast: Dilute oral contrast, 50 ml Omnipaque-300 IV  Comparison: 01/28/2009  Findings: Bibasilar pleural effusions atelectasis. Nasogastric tube in stomach. Multiple hepatic cysts. No additional focal abnormalities of the liver, spleen, pancreas, or adrenal glands. Distended urinary bladder. Dilatation of renal collecting systems/ureters, question due to bladder distention. Scattered atherosclerotic calcifications with prior stenting of the right iliac system.  Small amount of free pelvic fluid at the base of the left pericolic gutter. Scattered fluid and gas throughout the minimally distended colon to rectum. Dilated proximal and decompressed distal small bowel loops compatible with small bowel obstruction. Transition from dilated to nondilated small bowel loops occurs in the left mid pelvis, question adhesion. Prior ventral hernia repair. No bowel wall thickening, abscess or free intraperitoneal air.  No mass, adenopathy, or hernia. Bones appear diffusely demineralized. Orthopedic hardware proximal right femur across an intertrochanteric fracture. Degenerative disc disease changes and scoliosis lumbar spine.  IMPRESSION: Distended urinary bladder, which may account for ureteral dilatation and renal  collecting system dilatation bilaterally. Small bowel obstruction with transition from dilated to nondilated small bowel occurring in the left mid pelvis, question related to adhesion. Gas and fluid throughout the colon, which could be related to diarrhea or ileus. Hepatic cysts. Bibasilar effusions atelectasis.  Post ORIF intertrochanteric fracture right  femur.  Original Report Authenticated By: Lollie Marrow, M.[name].    Medications:  Scheduled:   . antiseptic oral rinse  15 mL Mouth Rinse q12n4p  . atorvastatin  10 mg Oral q1800  . chlorhexidine  15 mL Mouth Rinse BID  . lactated ringers  1,000 mL Intravenous Once  . lip balm  1 application Topical BID  . pantoprazole (PROTONIX) IV  40 mg Intravenous Q24H  . DISCONTD: Warfarin - Pharmacist Dosing Inpatient   Does not apply q1800   Continuous:   . sodium chloride 100 mL (07/08/11 0057)  . diltiazem (CARDIZEM) infusion 15 mg/hr (07/08/11 0600)    Assessment/Plan: Small bowel obstruction, NG tube remains, followup abdominal series pending, patient still with significant output if continue NG tube will need TNA. Apparently fecal impaction resolved again await followup abdominal series. Orders written to have patient mobilize Recent GI bleed, H&H remained stable Hypertension Acute on chronic renal insufficiency History of PE anticoagulation stopped secondary to GI bleed resumption pending if any need for surgery. For now will add subcutaneous heparin.   LOS: 14 days   [name] 07/08/2011, 12:14 PM

## 2011-07-08 NOTE — Progress Notes (Signed)
5 Days Post-Op  Subjective: I/O 3555/3730 (590 from NG) Multiple BM's yesterday, 5 listed patient says he had more than that.  He feels better, abd still quite distended. Asking for some juice.  Objective: Vital signs in last 24 hours: Temp:  [97.5 F (36.4 C)-98.7 F (37.1 C)] 98.2 F (36.8 C) (03/06 0400) Pulse Rate:  [104-122] 109  (03/06 0600) Resp:  [16-25] 18  (03/06 0600) BP: (118-158)/(54-68) 142/57 mmHg (03/06 0600) SpO2:  [93 %-96 %] 93 % (03/06 0600) Weight:  [64.4 kg (141 lb 15.6 oz)] 64.4 kg (141 lb 15.6 oz) (03/06 0400) Last BM Date: 07/02/11 (blood per rectum)  Intake/Output from previous day: 03/05 0701 - 03/06 0700 In: 3555 [P.O.:360; I.V.:2195; IV Piggyback:1000] Out: 3730 [Urine:3135; Emesis/NG output:590; Stool:5] Intake/Output this shift:   PE:  Alert, NAD, HR still tachy HR110-120,  Abd:  Still distended, +BS, +BM, more sore than tender.  NO rebound/peritonits  Lab Results:   Basename 07/07/11 0910  WBC --  HGB 10.0*  HCT 30.9*  PLT --    Lab 07/04/11 0919  AST 19  ALT 15  ALKPHOS 77  BILITOT 0.7  PROT 6.5  ALBUMIN 2.5*    BMET  Basename 07/08/11 0340 07/07/11 0335  NA 148* 146*  K 3.4* 4.2  CL 115* 112  CO2 21 21  GLUCOSE 93 134*  BUN 23 42*  CREATININE 1.47* 1.79*  CALCIUM 8.2* 8.3*   PT/INR  Basename 07/08/11 0340 07/07/11 0335  LABPROT 15.6* 15.6*  INR 1.21 1.21     Studies/Results: Ct Abdomen Pelvis W Contrast  07/07/2011  *RADIOLOGY REPORT*  Clinical Data: Small bowel obstruction, question abscess, rectal bleeding  CT ABDOMEN AND PELVIS WITH CONTRAST  Technique:  Multidetector CT imaging of the abdomen and pelvis was performed following the standard protocol during bolus administration of intravenous contrast. Sagittal and coronal MPR images reconstructed from axial data set.  Contrast: Dilute oral contrast, 50 ml Omnipaque-300 IV  Comparison: 01/28/2009  Findings: Bibasilar pleural effusions atelectasis. Nasogastric tube in  stomach. Multiple hepatic cysts. No additional focal abnormalities of the liver, spleen, pancreas, or adrenal glands. Distended urinary bladder. Dilatation of renal collecting systems/ureters, question due to bladder distention. Scattered atherosclerotic calcifications with prior stenting of the right iliac system.  Small amount of free pelvic fluid at the base of the left pericolic gutter. Scattered fluid and gas throughout the minimally distended colon to rectum. Dilated proximal and decompressed distal small bowel loops compatible with small bowel obstruction. Transition from dilated to nondilated small bowel loops occurs in the left mid pelvis, question adhesion. Prior ventral hernia repair. No bowel wall thickening, abscess or free intraperitoneal air.  No mass, adenopathy, or hernia. Bones appear diffusely demineralized. Orthopedic hardware proximal right femur across an intertrochanteric fracture. Degenerative disc disease changes and scoliosis lumbar spine.  IMPRESSION: Distended urinary bladder, which may account for ureteral dilatation and renal collecting system dilatation bilaterally. Small bowel obstruction with transition from dilated to nondilated small bowel occurring in the left mid pelvis, question related to adhesion. Gas and fluid throughout the colon, which could be related to diarrhea or ileus. Hepatic cysts. Bibasilar effusions atelectasis.  Post ORIF intertrochanteric fracture right femur.  Original Report Authenticated By: Lollie Marrow, M.D.   Dg Abd Portable 1v  07/06/2011  *RADIOLOGY REPORT*  Clinical Data: Rule out small bowel obstruction.  PORTABLE ABDOMEN - 1 VIEW  Comparison:  Abdominal radiograph 07/05/2011  Findings: When compared to the recent prior examinations, there is increasing  gaseous distension throughout multiple borderline dilated and mildly dilated loops of small bowel (up to 5 cm in diameter in the left upper quadrant, and 4 cm in diameter in the right lower quadrant).   Gas is again seen throughout a mildly distended colon, with a moderate volume of stool in the rectal vault.  A nasogastric tube is seen in the stomach.  Postoperative changes of mesh repair for abdominal wall hernia are again noted overlying the abdomen.  No gross evidence of pneumoperitoneum on this single supine view.  Vascular stents in the right iliac circulation incidentally noted.  IMPRESSION: 1.  Nonspecific bowel gas pattern, with increasing small bowel dilatation, as detailed above.  Findings could be indicative of early or partial small bowel obstruction.  Clinical correlation is recommended.  This was made a call report.  Original Report Authenticated By: Florencia Reasons, M.D.    Anti-infectives: Anti-infectives     Start     Dose/Rate Route Frequency Ordered Stop   06/26/11 2000   ceFAZolin (ANCEF) IVPB 1 g/50 mL premix        1 g 100 mL/hr over 30 Minutes Intravenous Every 6 hours 06/26/11 1658 06/27/11 0857         Current Facility-Administered Medications  Medication Dose Route Frequency Provider Last Rate Last Dose  . 0.9 %  sodium chloride infusion   Intravenous Continuous Ardeth Sportsman, MD 100 mL/hr at 07/08/11 0057 100 mL at 07/08/11 0057  . acetaminophen (TYLENOL) tablet 650 mg  650 mg Oral Q6H PRN Katy Apo, MD      . atorvastatin (LIPITOR) tablet 10 mg  10 mg Oral q1800 Katy Apo, MD   10 mg at 07/05/11 1730  . bisacodyl (DULCOLAX) suppository 10 mg  10 mg Rectal Q12H PRN Ardeth Sportsman, MD      . diltiazem (CARDIZEM) 100 mg in dextrose 5 % 100 mL infusion  5-15 mg/hr Intravenous Titrated Katy Apo, MD 15 mL/hr at 07/08/11 0600 15 mg/hr at 07/08/11 0600  . HYDROcodone-acetaminophen (NORCO) 5-325 MG per tablet 2 tablet  2 tablet Oral Q4H PRN Katy Apo, MD   2 tablet at 07/05/11 0901  . lactated ringers bolus 1,000 mL  1,000 mL Intravenous Once Ardeth Sportsman, MD   1,000 mL at 07/07/11 1530  . lip balm (CARMEX) ointment 1 application  1  application Topical BID Ardeth Sportsman, MD   1 application at 07/07/11 2200  . magic mouthwash  15 mL Oral QID PRN Ardeth Sportsman, MD      . menthol-cetylpyridinium (CEPACOL) lozenge 3 mg  1 lozenge Oral PRN Ardeth Sportsman, MD      . methocarbamol (ROBAXIN) 500 mg in dextrose 5 % 50 mL IVPB  500 mg Intravenous Q6H PRN [name], PA      . metoCLOPramide (REGLAN) injection 5-10 mg  5-10 mg Intravenous Q8H PRN [name], PA   5 mg at 07/05/11 1730  . morphine 2 MG/ML injection 2 mg  2 mg Intravenous Q4H PRN Duke Salvia, MD   2 mg at 07/07/11 2226  . ondansetron (ZOFRAN) injection 4 mg  4 mg Intravenous Q6H PRN Michelle A. Ashley Royalty, MD   4 mg at 07/05/11 1448  . pantoprazole (PROTONIX) injection 40 mg  40 mg Intravenous Q24H Katy Apo, MD   40 mg at 07/07/11 1140  . phenol (CHLORASEPTIC) mouth spray 2 spray  2 spray Mouth/Throat PRN Ardeth Sportsman, MD      .  DISCONTD: Warfarin - Pharmacist Dosing Inpatient   Does not apply [id number] Katy Apo, MD        Assessment/Plan PE (pulmonary embolism)  Chronic anticoagulation  Fall from standing  Fracture of hip, right, closed  GI bleed thought to be due to diverticular disease. Fecal Impaction Acute on Chronic renal insuffiency CT showing SBO, gas and fluid throughout colon; possible ileus or diarrhea related.  Distended Urinary bladder, with ureteral dilatation and renal collecting system dilatations. Now with foley in place.   Plan:  Continue NG drainage, bowel rest, foley, I will get another film in AM.  He can have some ice chips with NG in place, may want to consider TNA.  I would go slow.  Defer anticoagulation to primary.  If he doesn't open up and needs surgery for SBO would need to be considered.     LOS: 14 days    Anyah Swallow 07/08/2011

## 2011-07-08 NOTE — Progress Notes (Signed)
Physical Therapy Treatment Patient Details Name: [name] MRN: [id number] DOB: 08/17/1933 Today's Date: 07/08/2011  PT Assessment/Plan  PT - Assessment/Plan Comments on Treatment Session: Pt did very well today compared to last session.  Will attempt to ambulate next session pending NG tube.  Pt reports he would like therapy to explain transfers and give increased time during sessions.  Explained and demonstrated ankle pumps, quad sets and glut sets for pt to perform later today. PT Plan: Discharge plan remains appropriate;Frequency remains appropriate Follow Up Recommendations: Skilled nursing facility Equipment Recommended: Defer to next venue PT Goals  Acute Rehab PT Goals Pt will go Supine/Side to Sit: with supervision PT Goal: Supine/Side to Sit - Progress: Updated due to goal met Pt will go Sit to Stand: with supervision PT Goal: Sit to Stand - Progress: Updated due to goal met Pt will go Stand to Sit: with supervision PT Goal: Stand to Sit - Progress: Updated due to goals met  PT Treatment Precautions/Restrictions  Precautions Precautions: Fall Required Braces or Orthoses: No Restrictions Weight Bearing Restrictions: Yes RLE Weight Bearing: Weight bearing as tolerated Other Position/Activity Restrictions: Pt aware he is WBAT Mobility (including Balance) Bed Mobility Bed Mobility: Yes Supine to Sit: 3: Mod assist;HOB elevated (Comment degrees) Supine to Sit Details (indicate cue type and reason): +2 for safety, pt given slow step by step instructions and time to perform movement, used trapeze bar to assist transfer, assist given for R LE support and a little for trunk Transfers Transfers: Yes Sit to Stand: 3: Mod assist;From bed;From elevated surface;With upper extremity assist Sit to Stand Details (indicate cue type and reason): +2 for safety, pt kept R LE forward and demonstrated decreased WBing on R Stand to Sit: 4: Min assist;With armrests;To chair/3-in-1 Stand to  Sit Details: +2 for safety, verbal cue for armrests to control descent, R LE forward Stand Pivot Transfers: 4: Min assist Stand Pivot Transfer Details (indicate cue type and reason): +2 for safety, assist to steady, pt did well with sliding LEs on floor to turn and moving RW with verbal cues and increased time    Exercise  Total Joint Exercises Heel Slides: AAROM;Strengthening;Right;Other reps (comment);Supine;Other (comment) (30 reps, pt request to move LE prior to transfers.) End of Session PT - End of Session Equipment Utilized During Treatment: Gait belt Activity Tolerance: Patient tolerated treatment well Patient left: in chair;with call bell in reach General Behavior During Session: [facility] for tasks performed Cognition: [facility] for tasks performed  [name],[name] 07/08/2011, 1:25 PM Pager: [phone]

## 2011-07-08 NOTE — Progress Notes (Signed)
Patient discussed at the Long Length of Stay [name] 07/08/2011  

## 2011-07-08 NOTE — Progress Notes (Signed)
Many BMs a hopeful sign.  Doubt will need disimpaction now NGT < 700, but would keep NGT to LIWS for now Try to mobilize more Anticoag per primary IM service

## 2011-07-09 ENCOUNTER — Inpatient Hospital Stay (HOSPITAL_COMMUNITY): Payer: Medicare HMO

## 2011-07-09 LAB — COMPREHENSIVE METABOLIC PANEL
ALT: 14 U/L (ref 0–53)
AST: 17 U/L (ref 0–37)
Calcium: 8.1 mg/dL — ABNORMAL LOW (ref 8.4–10.5)
GFR calc Af Amer: 70 mL/min — ABNORMAL LOW (ref >90)
Sodium: 146 mEq/L — ABNORMAL HIGH (ref 135–145)
Total Protein: 5.4 g/dL — ABNORMAL LOW (ref 6.0–8.3)

## 2011-07-09 LAB — CBC
MCH: 28 pg (ref 26.0–34.0)
MCHC: 31.7 g/dL (ref 30.0–36.0)
Platelets: 352 10*3/uL (ref 150–400)

## 2011-07-09 LAB — GLUCOSE, CAPILLARY

## 2011-07-09 LAB — PROTIME-INR: Prothrombin Time: 15.4 seconds — ABNORMAL HIGH (ref 11.6–15.2)

## 2011-07-09 MED ORDER — SODIUM CHLORIDE 0.9 % IJ SOLN
10.0000 mL | INTRAMUSCULAR | Status: DC | PRN
  Administered 2011-07-10 – 2011-08-04 (×8): 10 mL

## 2011-07-09 MED ORDER — INSULIN ASPART 100 UNIT/ML ~~LOC~~ SOLN
0.0000 [IU] | Freq: Four times a day (QID) | SUBCUTANEOUS | Status: DC
  Administered 2011-07-10: 2 [IU] via SUBCUTANEOUS
  Administered 2011-07-10 (×2): 1 [IU] via SUBCUTANEOUS
  Administered 2011-07-11: 2 [IU] via SUBCUTANEOUS
  Administered 2011-07-11: 5 [IU] via SUBCUTANEOUS
  Filled 2011-07-09: qty 3

## 2011-07-09 MED ORDER — POTASSIUM CHLORIDE 10 MEQ/100ML IV SOLN
10.0000 meq | INTRAVENOUS | Status: AC
  Administered 2011-07-09 – 2011-07-10 (×8): 10 meq via INTRAVENOUS
  Filled 2011-07-09 (×5): qty 100

## 2011-07-09 MED ORDER — SODIUM CHLORIDE 0.9 % IJ SOLN
10.0000 mL | Freq: Two times a day (BID) | INTRAMUSCULAR | Status: DC
Start: 2011-07-09 — End: 2011-08-04
  Administered 2011-07-09 – 2011-08-02 (×26): 10 mL
  Administered 2011-08-02: 20 mL
  Administered 2011-08-02 – 2011-08-04 (×3): 10 mL

## 2011-07-09 MED ORDER — CLINIMIX E/DEXTROSE (5/15) 5 % IV SOLN
INTRAVENOUS | Status: AC
  Administered 2011-07-09: 18:00:00 via INTRAVENOUS
  Filled 2011-07-09: qty 1000

## 2011-07-09 MED ORDER — SODIUM CHLORIDE 0.9 % IV SOLN: INTRAVENOUS | Status: DC

## 2011-07-09 MED ORDER — TRAVASOL 10 % IV SOLN: INTRAVENOUS | Status: DC

## 2011-07-09 MED ORDER — SODIUM CHLORIDE 0.9 % IV SOLN: INTRAVENOUS | Status: AC

## 2011-07-09 NOTE — Progress Notes (Signed)
77yo AAM with frx R hip r/t fall. Had episode of Afib RVR 160s-170s and transferred to ICU. GI bleed with stable crit. Generalized weakness and deconditioning, Dr. Nehemiah Settle asked about TPN this am in rounds. TPN ordered and Picc RN notified for Picc Line placement. Dual lumen Picc placed in R basilic vein, awaiting xray for okay to use. Tolerated procedure well. 5 Runs of KCL infusing due to K+ 3.0. TPN ordered for this evening per PharmD based upon labs.

## 2011-07-09 NOTE — Progress Notes (Signed)
INITIAL ADULT NUTRITION ASSESSMENT Date: 07/09/2011   Time: 10:42 AM Reason for Assessment: TNA  ASSESSMENT: Male 78 y.o.  Dx: fall, fracture R hip  Hx:  Past Medical History  Diagnosis Date  . Asthma   . Hypertension   . Pulmonary embolism   . Peripheral artery disease   . Hypercholesteremia   . Bursitis of right shoulder   . DVT of leg (deep venous thrombosis)    Past Surgical History  Procedure Date  . Hernia repair   . Appendectomy   . Femoral-femoral bypass graft   . Iliac artery stent   . Esophagogastroduodenoscopy 07/03/2011    Procedure: ESOPHAGOGASTRODUODENOSCOPY (EGD);  Surgeon: Shirley Friar, MD;  Location: [facility];  Service: Endoscopy;  Laterality: N/A;  . Compression hip screw 06/26/2011    Procedure: COMPRESSION HIP;  Surgeon: Raymon Mutton, MD;  Location: [facility];  Service: Orthopedics;  Laterality: Right;    Related Meds:  Scheduled Meds:   . antiseptic oral rinse  15 mL Mouth Rinse q12n4p  . atorvastatin  10 mg Oral q1800  . chlorhexidine  15 mL Mouth Rinse BID  . heparin subcutaneous  5,000 Units Subcutaneous Q8H  . insulin aspart  0-9 Units Subcutaneous Q6H  . lip balm  1 application Topical BID  . pantoprazole (PROTONIX) IV  40 mg Intravenous Q24H  . potassium chloride  10 mEq Intravenous Q1 Hr x 5   Continuous Infusions:   . sodium chloride 100 mL/hr at 07/09/11 0600  . sodium chloride    . diltiazem (CARDIZEM) infusion 15 mg/hr (07/09/11 0915)  . TPN (CLINIMIX) +/- additives    . DISCONTD: ADULT TPN     PRN Meds:.acetaminophen, bisacodyl, HYDROcodone-acetaminophen, magic mouthwash, menthol-cetylpyridinium, methocarbamol(ROBAXIN) IV, metoCLOPramide (REGLAN) injection, morphine injection, ondansetron (ZOFRAN) IV, phenol   Ht: 5\' 6"  (167.6 cm)  Wt: 145 lb 8.1 oz (66 kg)  Ideal Wt: 142 lbs % Ideal Wt: 102%  Usual Wt: unable to assess % Usual Wt:   Body mass index is 23.48 kg/(m^2).  Food/Nutrition Related Hx: good health  PTA  Labs:  CMP     Component Value Date/Time   NA 146* 07/09/2011 0340   K 3.0* 07/09/2011 0340   CL 112 07/09/2011 0340   CO2 18* 07/09/2011 0340   GLUCOSE 84 07/09/2011 0340   BUN 14 07/09/2011 0340   CREATININE 1.15 07/09/2011 0340   CALCIUM 8.1* 07/09/2011 0340   PROT 5.4* 07/09/2011 0340   ALBUMIN 2.0* 07/09/2011 0340   AST 17 07/09/2011 0340   ALT 14 07/09/2011 0340   ALKPHOS 89 07/09/2011 0340   BILITOT 0.7 07/09/2011 0340   GFRNONAA 60* 07/09/2011 0340   GFRAA 70* 07/09/2011 0340   Lipase     Component Value Date/Time   LIPASE 52 07/04/2011 0919    Intake: NPO, except ice Output:   Intake/Output Summary (Last 24 hours) at 07/09/11 1051 Last data filed at 07/09/11 0915  Gross per 24 hour  Intake   3095 ml  Output   1686 ml  Net   1409 ml   Several BMs  Diet Order:  NPO except ice  Supplements/Tube Feeding:  None at this time  IVF:    sodium chloride Last Rate: 100 mL/hr at 07/09/11 0600  sodium chloride   diltiazem (CARDIZEM) infusion Last Rate: 15 mg/hr (07/09/11 0915)  TPN (CLINIMIX) +/- additives   DISCONTD: ADULT TPN     Estimated Nutritional Needs:   Kcal: 1850-2110 kcal Protein: 79-92g Fluid: ~2.0 L/day  Pt in good PTA, sustained a fall and broken R hip requiring ORIF.  Pt with abdominal pain attributed to constipation. Pt was eating well 15-75% of meals during initial post-op recovery period, however constipation continued.  Pt with bloody stool (2/28-3/1).  Pt transferred to SD with severe abdominal pain, fecal impaction.  Pt with several recent BMs, impaction resolving, SBO likely persists  NUTRITION DIAGNOSIS: -Inadequate oral intake (NI-2.1).  Status: Ongoing  RELATED TO: SBO  AS EVIDENCE BY: pt NPO, unable to tolerate diet advancement trials  MONITORING/EVALUATION(Goals): 1.  Parenteral nutrition; initiation with tolerance  EDUCATION NEEDS: -No education needs identified at this time  INTERVENTION: 1.  Parenteral nutrition; per PharmD.  Target maximum  estimated protein needs. Clinimix 5/15 to start @ 40 mL/hr with lipids MWF to provide 887 kcal, 48 g protein on average Clinimix 5/15 current goal rate of 70 mL/hr with lipids MWF to provide 1398 kcal, 84 g protein on average meeting 75% estimated minimal needs and 100% minimal estimated protein needs.  Dietitian #: [phone]  DOCUMENTATION CODES Per approved criteria  -Not Applicable    Loyce Dys [facility] 07/09/2011, 10:42 AM

## 2011-07-09 NOTE — Progress Notes (Signed)
Subjective: Patient still continues to have bowel movements, urine outflow is good with Foley catheter. Unfortunately followup x-ray still shows small bowel obstruction. Patient still is having output from NG tube. TPN as needed.  Objective: Vital signs in last 24 hours: Temp:  [97.4 F (36.3 C)-98.6 F (37 C)] 97.6 F (36.4 C) 07/12/22 1200) Pulse Rate:  [114-124] 114  07/12/2022 0900) Resp:  [17-26] 21  2022/07/12 0900) BP: (130-156)/(57-69) 149/66 mmHg 2022-07-12 0900) SpO2:  [95 %-97 %] 97 % 07-12-22 0900) Weight:  [66 kg (145 lb 8.1 oz)] 66 kg (145 lb 8.1 oz) Jul 12, 2022 0600) Weight change: 1.6 kg (3 lb 8.4 oz) Last BM Date: 07/08/11  Intake/Output from previous day: 03/06 0701 - 07/12/2022 0700 In: 3010 [P.O.:240; I.V.:2760; IV Piggyback:10] Out: 2001 [Urine:1750; Emesis/NG output:250; Stool:1] Intake/Output this shift: Total I/O In: 430 [I.V.:230; IV Piggyback:200] Out: -   Resp: clear to auscultation bilaterally Cardio: regular rate and rhythm, S1, S2 normal, no murmur, click, rub or gallop GI: less distended, positive bowel sounds, no organomegaly appreciated Extremities: right thigh a little swollen otherwise calf without edema  Lab Results:  Results for orders placed during the hospital encounter of 06/24/11 (from the past 24 hour(s))  PROTIME-INR     Status: Abnormal   Collection Time   07/12/2011  3:40 AM      Component Value Range   Prothrombin Time 15.4 (*) 11.6 - 15.2 (seconds)   INR 1.19  0.00 - 1.49   CBC     Status: Abnormal   Collection Time   07-12-2011  3:40 AM      Component Value Range   WBC 7.8  4.0 - 10.5 (K/uL)   RBC 3.32 (*) 4.22 - 5.81 (MIL/uL)   Hemoglobin 9.3 (*) 13.0 - 17.0 (g/dL)   HCT 11.9 (*) 14.7 - 52.0 (%)   MCV 88.3  78.0 - 100.0 (fL)   MCH 28.0  26.0 - 34.0 (pg)   MCHC 31.7  30.0 - 36.0 (g/dL)   RDW 82.9 (*) 56.2 - 15.5 (%)   Platelets 352  150 - 400 (K/uL)  COMPREHENSIVE METABOLIC PANEL     Status: Abnormal   Collection Time   Jul 12, 2011  3:40 AM       Component Value Range   Sodium 146 (*) 135 - 145 (mEq/L)   Potassium 3.0 (*) 3.5 - 5.1 (mEq/L)   Chloride 112  96 - 112 (mEq/L)   CO2 18 (*) 19 - 32 (mEq/L)   Glucose, Bld 84  70 - 99 (mg/dL)   BUN 14  6 - 23 (mg/dL)   Creatinine, Ser 1.30  0.50 - 1.35 (mg/dL)   Calcium 8.1 (*) 8.4 - 10.5 (mg/dL)   Total Protein 5.4 (*) 6.0 - 8.3 (g/dL)   Albumin 2.0 (*) 3.5 - 5.2 (g/dL)   AST 17  0 - 37 (U/L)   ALT 14  0 - 53 (U/L)   Alkaline Phosphatase 89  39 - 117 (U/L)   Total Bilirubin 0.7  0.3 - 1.2 (mg/dL)   GFR calc non Af Amer 60 (*) >90 (mL/min)   GFR calc Af Amer 70 (*) >90 (mL/min)  MAGNESIUM     Status: Normal   Collection Time   2011/07/12  3:40 AM      Component Value Range   Magnesium 1.5  1.5 - 2.5 (mg/dL)      Studies/Results: Dg Abd 2 Views  07/12/2011  *RADIOLOGY REPORT*  Clinical Data: Small bowel obstruction  ABDOMEN -  2 VIEW  Comparison: Radiographs and CT abdomen 07/06/2011  Findings: Tip of nasogastric tube is within the stomach though the proximal side port is above the gastroesophageal junction. Persistent dilatation of small bowel loops compatible with small bowel obstruction, with increase in diameter of the a small bowel loop in the left mid abdomen since previous study. Prior ventral hernia repair. Upper normal bowel wall thickness is seen at a small bowel loop in the right mid abdomen. Paucity of colonic gas. Bones demineralized. Prior stenting of right iliac arteries. Orthopedic hardware right femur.  IMPRESSION: Slightly increased small bowel distention compatible with persistent small bowel obstruction. Recommend advance nasogastric tube 5 cm to position proximal side port within stomach.  Original Report Authenticated By: Lollie Marrow, M.D.    Medications:  Scheduled:   . antiseptic oral rinse  15 mL Mouth Rinse q12n4p  . atorvastatin  10 mg Oral q1800  . chlorhexidine  15 mL Mouth Rinse BID  . heparin subcutaneous  5,000 Units Subcutaneous Q8H  . insulin aspart   0-9 Units Subcutaneous Q6H  . lip balm  1 application Topical BID  . pantoprazole (PROTONIX) IV  40 mg Intravenous Q24H  . potassium chloride  10 mEq Intravenous Q1 Hr x 5  . sodium chloride  10-40 mL Intracatheter Q12H   Continuous:   . sodium chloride 100 mL/hr at 07/09/11 0600  . sodium chloride    . diltiazem (CARDIZEM) infusion 15 mg/hr (07/09/11 0915)  . TPN (CLINIMIX) +/- additives    . DISCONTD: ADULT TPN      Assessment/Plan: Small bowel obstruction, x-ray still abnormal, TPN will be added, Continue NG tube, await further followup by surgery. X-rays suggest NG tube needs to be advanced, I did discuss this with the nurse. Clinically his abdomen looks much better Fecal impaction resolved History of PE GI bleed during hospitalization, requiring 2 units of packed red blood cells, no recurrent bleeding over the last 4-5 days. DVT prophylaxis added, full anti-coagulation will be added when there is no plans for surgery Hypokalemia secondary to increase output from NG tube, replace IV  LOS: 15 days   Amiliah Campisi D 07/09/2011, 12:22 PM

## 2011-07-09 NOTE — Progress Notes (Signed)
PARENTERAL NUTRITION CONSULT NOTE - INITIAL  Pharmacy Consult for TPN  Indication:   No Known Allergies  Patient Measurements: Height: 5\' 6"  (167.6 cm) Weight: 145 lb 8.1 oz (66 kg) IBW/kg (Calculated) : 63.8   Vital Signs: Temp: 97.9 F (36.6 C) (03/07 0400) Temp src: Oral (03/07 0400) BP: 140/65 mmHg (03/07 0400) Pulse Rate: 115  (03/07 0400) Intake/Output from previous day: 03/06 0701 - 03/07 0700 In: 2895 [P.O.:240; I.V.:2645; IV Piggyback:10] Out: 2001 [Urine:1750; Emesis/NG output:250; Stool:1] Intake/Output from this shift:    Labs:  Basename 07/09/11 0340 07/08/11 0340 07/07/11 0910 07/07/11 0335  WBC 7.8 -- -- --  HGB 9.3* -- 10.0* --  HCT 29.3* -- 30.9* --  PLT 352 -- -- --  APTT -- -- -- --  INR 1.19 1.21 -- 1.21     Basename 07/09/11 0340 07/08/11 0340 07/07/11 0335  NA 146* 148* 146*  K 3.0* 3.4* 4.2  CL 112 115* 112  CO2 18* 21 21  GLUCOSE 84 93 134*  BUN 14 23 42*  CREATININE 1.15 1.47* 1.79*  LABCREA -- -- --  CREAT24HRUR -- -- --  CALCIUM 8.1* 8.2* 8.3*  MG 1.5 -- --  PHOS -- -- --  PROT 5.4* -- --  ALBUMIN 2.0* -- --  AST 17 -- --  ALT 14 -- --  ALKPHOS 89 -- --  BILITOT 0.7 -- --  BILIDIR -- -- --  IBILI -- -- --  PREALBUMIN -- -- --  TRIG -- -- --  CHOLHDL -- -- --  CHOL -- -- --   Estimated Creatinine Clearance: 50.1 ml/min (by C-G formula based on Cr of 1.15).   No results found for this basename: GLUCAP:3 in the last 72 hours  Medical History: Past Medical History  Diagnosis Date  . Asthma   . Hypertension   . Pulmonary embolism   . Peripheral artery disease   . Hypercholesteremia   . Bursitis of right shoulder   . DVT of leg (deep venous thrombosis)    Medications:  Scheduled:    . antiseptic oral rinse  15 mL Mouth Rinse q12n4p  . atorvastatin  10 mg Oral q1800  . chlorhexidine  15 mL Mouth Rinse BID  . heparin subcutaneous  5,000 Units Subcutaneous Q8H  . lip balm  1 application Topical BID  . pantoprazole  (PROTONIX) IV  40 mg Intravenous Q24H   Infusions:    . sodium chloride 100 mL/hr at 07/09/11 0600  . ADULT TPN    . diltiazem (CARDIZEM) infusion 15 mg/hr (07/08/11 2000)    Insulin Requirements in the past 24 hours:  None   Current Nutrition:  None/NPO  Assessment:  77 YOM s/p ORIF 2/22, GI bleed 2/28 thought to be 2/2 diverticular disease, CT 3/4 consistent with SBO. NG tube with significant output, plan to start TNA. Last meal recorded 2/28. Awaiting abd series. RN Ulice Bold) to contact Dr. Nehemiah Settle for PICC line placement.   Na 146, Scr better, K 3  IVF with NS at 100 ml/min  Nutritional Goals:  1650 - 2310 kCal, 77 - 96 grams of protein per day Clinimix 5/20 at 70 ml/hr and lipid 20% at 10 ml/hr on MWF will provide 84 g of protein/day, 1478 kcal/day on non-lipids day, 1958 kcal on lipid days thus averaging 1684 kcal avg/day per week.    Plan:   At 1800 today:   Start Clinimix E 5/15 at 40 ml/hr giving current availability.    Plan to dadvance  as tolerated to Clinimix E 5/20 at 70 ml/hr to provide above nutritional goals.   IV fat emulsion, MVI and trace elements added to TPN only on MWF due to ongoing shortage  Reduce IVF to 60 ml/hr.  Add sensitive SSI q6h  KCl runs x 5 for K repletion   TNA lab panels on Mondays & Thursdays.  Dietian consult   Pharmacy will f/u daily  Geoffry Paradise Thi 07/09/2011,8:07 AM

## 2011-07-09 NOTE — Progress Notes (Signed)
6 Days Post-Op  Subjective: Afebrile, Still tachycardic  HR 130's- 110's, still having BM's, WBC is 7.8.  Film shows slight increase in Small bowel distension He doesn't feel bad, say he couldn't stop having BM. Says its soft, stool in texture Objective: Vital signs in last 24 hours: Temp:  [97.4 F (36.3 C)-98.6 F (37 C)] 97.4 F (36.3 C) 07-27-2022 0800) Pulse Rate:  [115-123] 115  2022-07-27 0400) Resp:  [17-24] 24  27-Jul-2022 0600) BP: (130-152)/(60-69) 140/65 mmHg 07-27-22 0400) SpO2:  [95 %-97 %] 97 % 07/27/22 0400) Weight:  [66 kg (145 lb 8.1 oz)] 66 kg (145 lb 8.1 oz) 27-Jul-2022 0600) Last BM Date: 07/08/11  Intake/Output from previous day: 03/06 0701 - 07-27-2022 0700 In: 2895 [P.O.:240; I.V.:2645; IV Piggyback:10] Out: 2001 [Urine:1750; Emesis/NG output:250; Stool:1] Intake/Output this shift:    Alert, chest clear anteriorly, Abd: Distended, few bowel sounds, soft, no tenderness on palpation.  Lab Results:   [facility] July 27, 2011 0340 07/07/11 0910  WBC 7.8 --  HGB 9.3* 10.0*  HCT 29.3* 30.9*  PLT 352 --    BMET  Basename 2011/07/27 0340 07/08/11 0340  NA 146* 148*  K 3.0* 3.4*  CL 112 115*  CO2 18* 21  GLUCOSE 84 93  BUN 14 23  CREATININE 1.15 1.47*  CALCIUM 8.1* 8.2*   PT/INR  Basename 2011-07-27 0340 07/08/11 0340  LABPROT 15.4* 15.6*  INR 1.19 1.21     Studies/Results: Dg Abd 2 Views  07-27-2011  *RADIOLOGY REPORT*  Clinical Data: Small bowel obstruction  ABDOMEN - 2 VIEW  Comparison: Radiographs and CT abdomen 07/06/2011  Findings: Tip of nasogastric tube is within the stomach though the proximal side port is above the gastroesophageal junction. Persistent dilatation of small bowel loops compatible with small bowel obstruction, with increase in diameter of the a small bowel loop in the left mid abdomen since previous study. Prior ventral hernia repair. Upper normal bowel wall thickness is seen at a small bowel loop in the right mid abdomen. Paucity of colonic gas. Bones  demineralized. Prior stenting of right iliac arteries. Orthopedic hardware right femur.  IMPRESSION: Slightly increased small bowel distention compatible with persistent small bowel obstruction. Recommend advance nasogastric tube 5 cm to position proximal side port within stomach.  Original Report Authenticated By: [name], M.D.    Anti-infectives: Anti-infectives     Start     Dose/Rate Route Frequency Ordered Stop   06/26/11 2000   ceFAZolin (ANCEF) IVPB 1 g/50 mL premix        1 g 100 mL/hr over 30 Minutes Intravenous Every 6 hours 06/26/11 1658 06/27/11 0857         Current Facility-Administered Medications  Medication Dose Route Frequency Provider Last Rate Last Dose  . 0.9 %  sodium chloride infusion   Intravenous Continuous [name] Sportsman, MD 100 mL/hr at 2011/07/27 0600    . acetaminophen (TYLENOL) tablet 650 mg  650 mg Oral Q6H PRN [name] Apo, MD      . antiseptic oral rinse (BIOTENE) solution 15 mL  15 mL Mouth Rinse q12n4p [name] Apo, MD   15 mL at 07/08/11 1638  . atorvastatin (LIPITOR) tablet 10 mg  10 mg Oral q1800 [name] Apo, MD   10 mg at 07/05/11 1730  . bisacodyl (DULCOLAX) suppository 10 mg  10 mg Rectal Q12H PRN [name] Sportsman, MD      . chlorhexidine (PERIDEX) 0.12 % solution 15 mL  15 mL Mouth Rinse BID  [name] Apo, MD   15 mL at 07/08/11 2000  . diltiazem (CARDIZEM) 100 mg in dextrose 5 % 100 mL infusion  5-15 mg/hr Intravenous Titrated [name] Apo, MD 15 mL/hr at 07/08/11 2000 15 mg/hr at 07/08/11 2000  . heparin injection 5,000 Units  5,000 Units Subcutaneous Q8H [name] Apo, MD   5,000 Units at 07/08/11 2200  . HYDROcodone-acetaminophen (NORCO) 5-325 MG per tablet 2 tablet  2 tablet Oral Q4H PRN [name] Apo, MD   2 tablet at 07/05/11 0901  . lip balm (CARMEX) ointment 1 application  1 application Topical BID [name] Sportsman, MD   1 application at 07/08/11 2200  . magic mouthwash  15 mL Oral QID PRN [name] Sportsman, MD      .  menthol-cetylpyridinium (CEPACOL) lozenge 3 mg  1 lozenge Oral PRN [name] Sportsman, MD      . methocarbamol (ROBAXIN) 500 mg in dextrose 5 % 50 mL IVPB  500 mg Intravenous Q6H PRN [name], PA      . metoCLOPramide (REGLAN) injection 5-10 mg  5-10 mg Intravenous Q8H PRN [name], PA   5 mg at 07/05/11 1730  . morphine 2 MG/ML injection 2 mg  2 mg Intravenous Q4H PRN Duke Salvia, MD   2 mg at 07/08/11 1119  . ondansetron (ZOFRAN) injection 4 mg  4 mg Intravenous Q6H PRN Michelle A. Ashley Royalty, MD   4 mg at 07/05/11 1448  . pantoprazole (PROTONIX) injection 40 mg  40 mg Intravenous Q24H [name] Apo, MD   40 mg at 07/08/11 1204  . phenol (CHLORASEPTIC) mouth spray 2 spray  2 spray Mouth/Throat PRN [name] Sportsman, MD      . DISCONTD: ADULT TPN   Intravenous Continuous TPN [name] Apo, MD        Assessment/Plan PE (pulmonary embolism)  Chronic anticoagulation  Fall from standing  Fracture of hip, right, closed  GI bleed thought to be due to diverticular disease.  Fecal Impaction  Acute on Chronic renal insuffiency  CT showing SBO, gas and fluid throughout colon; possible ileus or diarrhea related.  Distended Urinary bladder, with ureteral dilatation and renal collecting system dilatations. Now with foley in place.   Plan:  He feels better and is still having BM's even with film showing more distension. Will discuss with DR. Tilden Dome.     LOS: 15 days    [name] 07/09/2011

## 2011-07-10 ENCOUNTER — Inpatient Hospital Stay (HOSPITAL_COMMUNITY): Payer: Medicare HMO

## 2011-07-10 ENCOUNTER — Encounter (HOSPITAL_COMMUNITY): Payer: Self-pay

## 2011-07-10 ENCOUNTER — Encounter (HOSPITAL_COMMUNITY): Payer: Self-pay | Admitting: Anesthesiology

## 2011-07-10 ENCOUNTER — Inpatient Hospital Stay (HOSPITAL_COMMUNITY): Payer: Medicare HMO | Admitting: Anesthesiology

## 2011-07-10 ENCOUNTER — Encounter (HOSPITAL_COMMUNITY)
Admission: EM | Disposition: A | Discharge status: Skilled Nursing Facility | Payer: Self-pay | Source: Home / Self Care | Attending: Internal Medicine

## 2011-07-10 DIAGNOSIS — J9600 Acute respiratory failure, unspecified whether with hypoxia or hypercapnia: Secondary | ICD-10-CM

## 2011-07-10 DIAGNOSIS — A0472 Enterocolitis due to Clostridium difficile, not specified as recurrent: Secondary | ICD-10-CM

## 2011-07-10 DIAGNOSIS — R000 Tachycardia, unspecified: Secondary | ICD-10-CM

## 2011-07-10 DIAGNOSIS — J9601 Acute respiratory failure with hypoxia: Secondary | ICD-10-CM | POA: Diagnosis not present

## 2011-07-10 DIAGNOSIS — K631 Perforation of intestine (nontraumatic): Secondary | ICD-10-CM

## 2011-07-10 HISTORY — PX: LAPAROSCOPY: SHX197

## 2011-07-10 LAB — DIFFERENTIAL
Eosinophils Relative: 2 % (ref 0–5)
Lymphocytes Relative: 13 % (ref 12–46)
Lymphs Abs: 1.3 10*3/uL (ref 0.7–4.0)
Monocytes Absolute: 0.5 10*3/uL (ref 0.1–1.0)

## 2011-07-10 LAB — BASIC METABOLIC PANEL
BUN: 12 mg/dL (ref 6–23)
Calcium: 7.5 mg/dL — ABNORMAL LOW (ref 8.4–10.5)
Creatinine, Ser: 1.11 mg/dL (ref 0.50–1.35)
GFR calc Af Amer: 73 mL/min — ABNORMAL LOW (ref >90)
GFR calc non Af Amer: 63 mL/min — ABNORMAL LOW (ref >90)
Glucose, Bld: 169 mg/dL — ABNORMAL HIGH (ref 70–99)

## 2011-07-10 LAB — COMPREHENSIVE METABOLIC PANEL
AST: 17 U/L (ref 0–37)
CO2: 24 mEq/L (ref 19–32)
Chloride: 109 mEq/L (ref 96–112)
Creatinine, Ser: 0.97 mg/dL (ref 0.50–1.35)
GFR calc Af Amer: >90 mL/min (ref >90)
GFR calc non Af Amer: 79 mL/min — ABNORMAL LOW (ref >90)
Glucose, Bld: 152 mg/dL — ABNORMAL HIGH (ref 70–99)
Total Bilirubin: 0.7 mg/dL (ref 0.3–1.2)

## 2011-07-10 LAB — GLUCOSE, CAPILLARY: Glucose-Capillary: 148 mg/dL — ABNORMAL HIGH (ref 70–99)

## 2011-07-10 LAB — CBC
HCT: 28.3 % — ABNORMAL LOW (ref 39.0–52.0)
MCV: 87.1 fL (ref 78.0–100.0)
RBC: 3.25 MIL/uL — ABNORMAL LOW (ref 4.22–5.81)
RDW: 17.1 % — ABNORMAL HIGH (ref 11.5–15.5)
WBC: 9.9 10*3/uL (ref 4.0–10.5)

## 2011-07-10 LAB — MAGNESIUM: Magnesium: 1.3 mg/dL — ABNORMAL LOW (ref 1.5–2.5)

## 2011-07-10 LAB — LACTIC ACID, PLASMA: Lactic Acid, Venous: 2.6 mmol/L — ABNORMAL HIGH (ref 0.5–2.2)

## 2011-07-10 LAB — PROTIME-INR: INR: 1.19 (ref 0.00–1.49)

## 2011-07-10 SURGERY — LAPAROSCOPY, DIAGNOSTIC
Anesthesia: General | Site: Abdomen | Wound class: Clean

## 2011-07-10 MED ORDER — MAGNESIUM SULFATE 40 MG/ML IJ SOLN
4.0000 g | Freq: Once | INTRAMUSCULAR | Status: AC
  Administered 2011-07-10: 4 g via INTRAVENOUS
  Filled 2011-07-10: qty 100

## 2011-07-10 MED ORDER — MIDAZOLAM HCL 2 MG/2ML IJ SOLN
1.0000 mg | INTRAMUSCULAR | Status: DC | PRN
  Administered 2011-07-10: 2 mg via INTRAVENOUS

## 2011-07-10 MED ORDER — PROMETHAZINE HCL 25 MG/ML IJ SOLN: 6.2500 mg | INTRAMUSCULAR | Status: DC | PRN

## 2011-07-10 MED ORDER — LACTATED RINGERS IR SOLN
Status: DC | PRN
  Administered 2011-07-10: 1000 mL

## 2011-07-10 MED ORDER — 0.9 % SODIUM CHLORIDE (POUR BTL) OPTIME
TOPICAL | Status: DC | PRN
  Administered 2011-07-10: 2000 mL

## 2011-07-10 MED ORDER — FENTANYL CITRATE 0.05 MG/ML IJ SOLN
INTRAMUSCULAR | Status: DC | PRN
  Administered 2011-07-10: 100 ug via INTRAVENOUS
  Administered 2011-07-10 (×5): 50 ug via INTRAVENOUS

## 2011-07-10 MED ORDER — NEOSTIGMINE METHYLSULFATE 1 MG/ML IJ SOLN
INTRAMUSCULAR | Status: DC | PRN
  Administered 2011-07-10: 4 mg via INTRAVENOUS

## 2011-07-10 MED ORDER — TRACE MINERALS CR-CU-MN-SE-ZN 10-1000-500-60 MCG/ML IV SOLN
INTRAVENOUS | Status: AC
  Administered 2011-07-10: 22:00:00 via INTRAVENOUS
  Filled 2011-07-10: qty 2000

## 2011-07-10 MED ORDER — PIPERACILLIN-TAZOBACTAM 3.375 G IVPB
3.3750 g | Freq: Three times a day (TID) | INTRAVENOUS | Status: DC
  Administered 2011-07-10 – 2011-07-15 (×14): 3.375 g via INTRAVENOUS
  Filled 2011-07-10 (×16): qty 50

## 2011-07-10 MED ORDER — PROPOFOL 10 MG/ML IV BOLUS
INTRAVENOUS | Status: DC | PRN
  Administered 2011-07-10: 100 mg via INTRAVENOUS

## 2011-07-10 MED ORDER — CISATRACURIUM BESYLATE 2 MG/ML IV SOLN
INTRAVENOUS | Status: DC | PRN
  Administered 2011-07-10 (×3): 1 mg via INTRAVENOUS
  Administered 2011-07-10: 4 mg via INTRAVENOUS
  Administered 2011-07-10 (×3): 1 mg via INTRAVENOUS
  Administered 2011-07-10: 4 mg via INTRAVENOUS
  Administered 2011-07-10: 1 mg via INTRAVENOUS

## 2011-07-10 MED ORDER — FAT EMULSION 20 % IV EMUL
250.0000 mL | INTRAVENOUS | Status: AC
  Administered 2011-07-10: 250 mL via INTRAVENOUS
  Filled 2011-07-10: qty 250

## 2011-07-10 MED ORDER — VANCOMYCIN HCL 500 MG IV SOLR
500.0000 mg | Freq: Two times a day (BID) | INTRAVENOUS | Status: DC
  Administered 2011-07-10 – 2011-07-11 (×2): 500 mg via INTRAVENOUS
  Filled 2011-07-10 (×3): qty 500

## 2011-07-10 MED ORDER — SODIUM CHLORIDE 0.9 % IV SOLN: INTRAVENOUS | Status: DC

## 2011-07-10 MED ORDER — ONDANSETRON HCL 4 MG/2ML IJ SOLN
INTRAMUSCULAR | Status: DC | PRN
  Administered 2011-07-10: 4 mg via INTRAVENOUS

## 2011-07-10 MED ORDER — GLYCOPYRROLATE 0.2 MG/ML IJ SOLN
INTRAMUSCULAR | Status: DC | PRN
  Administered 2011-07-10: .5 mg via INTRAVENOUS

## 2011-07-10 MED ORDER — LABETALOL HCL 5 MG/ML IV SOLN
INTRAVENOUS | Status: DC | PRN
  Administered 2011-07-10 (×4): 5 mg via INTRAVENOUS

## 2011-07-10 MED ORDER — HEPARIN (PORCINE) IN NACL 100-0.45 UNIT/ML-% IJ SOLN
1300.0000 [IU]/h | INTRAMUSCULAR | Status: DC
  Administered 2011-07-10 – 2011-07-11 (×2): 1100 [IU]/h via INTRAVENOUS
  Administered 2011-07-12: 1200 [IU]/h via INTRAVENOUS
  Filled 2011-07-10 (×7): qty 250

## 2011-07-10 MED ORDER — FENTANYL CITRATE 0.05 MG/ML IJ SOLN
25.0000 ug | INTRAMUSCULAR | Status: DC | PRN
  Administered 2011-07-10 (×2): 50 ug via INTRAVENOUS
  Administered 2011-07-11 (×2): 25 ug via INTRAVENOUS
  Filled 2011-07-10 (×5): qty 2

## 2011-07-10 MED ORDER — PHENYLEPHRINE HCL 10 MG/ML IJ SOLN
10.0000 mg | INTRAMUSCULAR | Status: DC | PRN
  Administered 2011-07-10: 80 ug via INTRAVENOUS

## 2011-07-10 MED ORDER — FENTANYL CITRATE 0.05 MG/ML IJ SOLN
25.0000 ug | INTRAMUSCULAR | Status: DC | PRN
  Administered 2011-07-10: 50 ug via INTRAVENOUS

## 2011-07-10 MED ORDER — LACTATED RINGERS IV BOLUS (SEPSIS): 1000.0000 mL | Freq: Three times a day (TID) | INTRAVENOUS | Status: DC | PRN

## 2011-07-10 MED ORDER — BUPIVACAINE-EPINEPHRINE 0.25% -1:200000 IJ SOLN
INTRAMUSCULAR | Status: DC | PRN
  Administered 2011-07-10: 50 mL

## 2011-07-10 MED ORDER — POTASSIUM CHLORIDE 10 MEQ/100ML IV SOLN
10.0000 meq | INTRAVENOUS | Status: AC
  Administered 2011-07-10 (×2): 10 meq via INTRAVENOUS
  Filled 2011-07-10: qty 600

## 2011-07-10 MED ORDER — STERILE WATER FOR IRRIGATION IR SOLN
Status: DC | PRN
  Administered 2011-07-10: 1500 mL

## 2011-07-10 MED ORDER — ERTAPENEM SODIUM 1 G IJ SOLR
1.0000 g | INTRAMUSCULAR | Status: DC
  Administered 2011-07-10: 1 g via INTRAVENOUS
  Filled 2011-07-10 (×2): qty 1

## 2011-07-10 MED ORDER — SUCCINYLCHOLINE CHLORIDE 20 MG/ML IJ SOLN
INTRAMUSCULAR | Status: DC | PRN
  Administered 2011-07-10: 100 mg via INTRAVENOUS

## 2011-07-10 MED ORDER — LACTATED RINGERS IV SOLN
INTRAVENOUS | Status: DC | PRN
  Administered 2011-07-10 (×3): via INTRAVENOUS

## 2011-07-10 MED ORDER — DILTIAZEM HCL 100 MG IV SOLR
5.0000 mg/h | INTRAVENOUS | Status: DC
  Administered 2011-07-10: 5 mg/h via INTRAVENOUS
  Administered 2011-07-11 (×2): 15 mg/h via INTRAVENOUS
  Filled 2011-07-10: qty 100

## 2011-07-10 MED ORDER — SODIUM CHLORIDE 0.9 % IV SOLN
INTRAVENOUS | Status: DC
  Administered 2011-07-10: 75 mL/h via INTRAVENOUS
  Administered 2011-07-11 – 2011-07-12 (×3): via INTRAVENOUS

## 2011-07-10 MED ORDER — ACETAMINOPHEN 160 MG/5ML PO SOLN
650.0000 mg | Freq: Four times a day (QID) | ORAL | Status: DC | PRN
  Administered 2011-07-10 – 2011-08-04 (×33): 650 mg
  Filled 2011-07-10 (×34): qty 20.3

## 2011-07-10 SURGICAL SUPPLY — 49 items
APPLIER CLIP ROT 10 11.4 M/L (STAPLE) ×2
APR CLP MED LRG 11.4X10 (STAPLE) ×1
BAG SPEC RTRVL LRG 6X4 10 (ENDOMECHANICALS)
BRR ADH 5X3 SEPRAFILM 6 SHT (MISCELLANEOUS) ×1
CANISTER SUCTION 2500CC (MISCELLANEOUS) ×4 IMPLANT
CELLS DAT CNTRL 66122 CELL SVR (MISCELLANEOUS) ×1 IMPLANT
CLIP APPLIE ROT 10 11.4 M/L (STAPLE) IMPLANT
CLOTH BEACON ORANGE TIMEOUT ST (SAFETY) ×2 IMPLANT
CORD HIGH FREQUENCY UNIPOLAR (ELECTROSURGICAL) ×2 IMPLANT
DECANTER SPIKE VIAL GLASS SM (MISCELLANEOUS) ×2 IMPLANT
DRAPE LAPAROSCOPIC ABDOMINAL (DRAPES) ×2 IMPLANT
DRSG TEGADERM 2-3/8X2-3/4 SM (GAUZE/BANDAGES/DRESSINGS) ×7 IMPLANT
DRSG TEGADERM 4X4.75 (GAUZE/BANDAGES/DRESSINGS) ×2 IMPLANT
ELECT REM PT RETURN 9FT ADLT (ELECTROSURGICAL) ×2
ELECTRODE REM PT RTRN 9FT ADLT (ELECTROSURGICAL) ×1 IMPLANT
ENDOLOOP SUT PDS II  0 18 (SUTURE)
ENDOLOOP SUT PDS II 0 18 (SUTURE) IMPLANT
GAUZE SPONGE 4X4 16PLY XRAY LF (GAUZE/BANDAGES/DRESSINGS) ×2 IMPLANT
GLOVE BIOGEL PI IND STRL 7.0 (GLOVE) ×1 IMPLANT
GLOVE BIOGEL PI INDICATOR 7.0 (GLOVE) ×2
GLOVE ECLIPSE 8.0 STRL XLNG CF (GLOVE) ×3 IMPLANT
GLOVE INDICATOR 8.0 STRL GRN (GLOVE) ×2 IMPLANT
GOWN STRL NON-REIN LRG LVL3 (GOWN DISPOSABLE) ×3 IMPLANT
GOWN STRL REIN XL XLG (GOWN DISPOSABLE) ×5 IMPLANT
KIT BASIN OR (CUSTOM PROCEDURE TRAY) ×2 IMPLANT
NS IRRIG 1000ML POUR BTL (IV SOLUTION) ×4 IMPLANT
PENCIL BUTTON HOLSTER BLD 10FT (ELECTRODE) ×1 IMPLANT
POUCH SPECIMEN RETRIEVAL 10MM (ENDOMECHANICALS) IMPLANT
RETRACTOR WND ALEXIS 18 MED (MISCELLANEOUS) IMPLANT
RTRCTR WOUND ALEXIS 18CM MED (MISCELLANEOUS) ×2
SCISSORS LAP 5X35 DISP (ENDOMECHANICALS) ×2 IMPLANT
SEPRAFILM PROCEDURAL PACK 3X5 (MISCELLANEOUS) ×1 IMPLANT
SET IRRIG TUBING LAPAROSCOPIC (IRRIGATION / IRRIGATOR) ×2 IMPLANT
SLEEVE Z-THREAD 5X100MM (TROCAR) ×2 IMPLANT
SOLUTION ANTI FOG 6CC (MISCELLANEOUS) ×2 IMPLANT
SPONGE LAP 18X18 X RAY DECT (DISPOSABLE) ×1 IMPLANT
STRIP CLOSURE SKIN 1/2X4 (GAUZE/BANDAGES/DRESSINGS) ×1 IMPLANT
SUT MNCRL AB 4-0 PS2 18 (SUTURE) ×2 IMPLANT
SUT PDS AB 1 CTX 36 (SUTURE) ×2 IMPLANT
SUT SILK 2 0 SH CR/8 (SUTURE) ×1 IMPLANT
SUT SILK 3 0 SH CR/8 (SUTURE) ×3 IMPLANT
SUT SILK 4-0 12X30IN (SUTURE) ×1 IMPLANT
TOWEL OR 17X26 10 PK STRL BLUE (TOWEL DISPOSABLE) ×3 IMPLANT
TRAY FOLEY CATH 14FRSI W/METER (CATHETERS) ×1 IMPLANT
TRAY LAP CHOLE (CUSTOM PROCEDURE TRAY) ×2 IMPLANT
TROCAR Z-THREAD FIOS 12X100MM (TROCAR) IMPLANT
TROCAR Z-THREAD FIOS 5X100MM (TROCAR) ×4 IMPLANT
TROCAR Z-THREAD SLEEVE 11X100 (TROCAR) IMPLANT
TUBING INSUFFLATION 10FT LAP (TUBING) ×3 IMPLANT

## 2011-07-10 NOTE — Preoperative (Signed)
Beta Blockers   Reason not to administer Beta Blockers:Not Applicable 

## 2011-07-10 NOTE — Anesthesia Preprocedure Evaluation (Addendum)
Anesthesia Evaluation  Patient identified by MRN, date of birth, ID band Patient awake    Reviewed: Allergy & Precautions, H&P , NPO status , Patient's Chart, lab work & pertinent test results  Airway Mallampati: II TM Distance: >3 FB Neck ROM: Full    Dental No notable dental hx.    Pulmonary asthma ,  breath sounds clear to auscultation  Pulmonary exam normal       Cardiovascular hypertension, Pt. on home beta blockers Rhythm:Regular Rate:Normal  H/o tachycardia in 2010 with echocardiogram (reviewed.   Neuro/Psych negative neurological ROS  negative psych ROS   GI/Hepatic Neg liver ROS, SBO s/p hip surgery 06/26/11   Endo/Other  negative endocrine ROS  Renal/GU negative Renal ROS  negative genitourinary   Musculoskeletal negative musculoskeletal ROS (+)   Abdominal   Peds negative pediatric ROS (+)  Hematology negative hematology ROS (+)   Anesthesia Other Findings   Reproductive/Obstetrics negative OB ROS                         Anesthesia Physical Anesthesia Plan  ASA: III  Anesthesia Plan: General   Post-op Pain Management:    Induction: Intravenous  Airway Management Planned: Oral ETT  Additional Equipment:   Intra-op Plan:   Post-operative Plan: Extubation in OR  Informed Consent: I have reviewed the patients History and Physical, chart, labs and discussed the procedure including the risks, benefits and alternatives for the proposed anesthesia with the patient or authorized representative who has indicated his/her understanding and acceptance.   Dental advisory given  Plan Discussed with: CRNA  Anesthesia Plan Comments:        Anesthesia Quick Evaluation

## 2011-07-10 NOTE — Progress Notes (Signed)
Subjective: No new problems overnight, patient continues to have bowel movement however abdomen remains distended. Case discussed with surgical PA, plans for OR.  Objective: Vital signs in last 24 hours: Temp:  [97.6 F (36.4 C)-98.5 F (36.9 C)] 97.9 F (36.6 C) (03/08 0800) Pulse Rate:  [104-119] 118  (03/08 0400) Resp:  [15-20] 20  (03/08 0400) BP: (131-152)/(56-72) 143/66 mmHg (03/08 0400) SpO2:  [96 %-99 %] 99 % (03/08 0400) Weight change:  Last BM Date: 07/09/11  Intake/Output from previous day: 03/07 0701 - 03/08 0700 In: 4071.7 [P.O.:120; I.V.:2785; IV Piggyback:400; TPN:526.7] Out: 2675 [Urine:2575; Emesis/NG output:100] Intake/Output this shift: Total I/O In: -  Out: 550 [Urine:550]  Resp: clear to auscultation bilaterally Cardio: regular rate and rhythm, S1, S2 normal, no murmur, click, rub or gallop GI: distended, decreased bowel sounds  Lab Results:  Results for orders placed during the hospital encounter of 06/24/11 (from the past 24 hour(s))  GLUCOSE, CAPILLARY     Status: Normal   Collection Time   07/09/11  6:54 PM      Component Value Range   Glucose-Capillary 93  70 - 99 (mg/dL)   Comment 1 Notify RN    GLUCOSE, CAPILLARY     Status: Abnormal   Collection Time   07/09/11 11:13 PM      Component Value Range   Glucose-Capillary 122 (*) 70 - 99 (mg/dL)   Comment 1 Notify RN    PROTIME-INR     Status: Abnormal   Collection Time   07/10/11  5:10 AM      Component Value Range   Prothrombin Time 15.4 (*) 11.6 - 15.2 (seconds)   INR 1.19  0.00 - 1.49   COMPREHENSIVE METABOLIC PANEL     Status: Abnormal   Collection Time   07/10/11  5:10 AM      Component Value Range   Sodium 140  135 - 145 (mEq/L)   Potassium 2.9 (*) 3.5 - 5.1 (mEq/L)   Chloride 109  96 - 112 (mEq/L)   CO2 24  19 - 32 (mEq/L)   Glucose, Bld 152 (*) 70 - 99 (mg/dL)   BUN 9  6 - 23 (mg/dL)   Creatinine, Ser 1.61  0.50 - 1.35 (mg/dL)   Calcium 7.9 (*) 8.4 - 10.5 (mg/dL)   Total Protein  5.5 (*) 6.0 - 8.3 (g/dL)   Albumin 1.9 (*) 3.5 - 5.2 (g/dL)   AST 17  0 - 37 (U/L)   ALT 15  0 - 53 (U/L)   Alkaline Phosphatase 89  39 - 117 (U/L)   Total Bilirubin 0.7  0.3 - 1.2 (mg/dL)   GFR calc non Af Amer 79 (*) >90 (mL/min)   GFR calc Af Amer >90  >90 (mL/min)  MAGNESIUM     Status: Abnormal   Collection Time   07/10/11  5:10 AM      Component Value Range   Magnesium 1.3 (*) 1.5 - 2.5 (mg/dL)  PHOSPHORUS     Status: Normal   Collection Time   07/10/11  5:10 AM      Component Value Range   Phosphorus 2.3  2.3 - 4.6 (mg/dL)  CHOLESTEROL, TOTAL     Status: Normal   Collection Time   07/10/11  5:10 AM      Component Value Range   Cholesterol 76  0 - 200 (mg/dL)  TRIGLYCERIDES     Status: Normal   Collection Time   07/10/11  5:10 AM  Component Value Range   Triglycerides 115  <150 (mg/dL)  CBC     Status: Abnormal   Collection Time   07/10/11  5:10 AM      Component Value Range   WBC 9.9  4.0 - 10.5 (K/uL)   RBC 3.25 (*) 4.22 - 5.81 (MIL/uL)   Hemoglobin 9.1 (*) 13.0 - 17.0 (g/dL)   HCT 40.9 (*) 81.1 - 52.0 (%)   MCV 87.1  78.0 - 100.0 (fL)   MCH 28.0  26.0 - 34.0 (pg)   MCHC 32.2  30.0 - 36.0 (g/dL)   RDW 91.4 (*) 78.2 - 15.5 (%)   Platelets 352  150 - 400 (K/uL)  DIFFERENTIAL     Status: Abnormal   Collection Time   07/10/11  5:10 AM      Component Value Range   Neutrophils Relative 80 (*) 43 - 77 (%)   Neutro Abs 7.9 (*) 1.7 - 7.7 (K/uL)   Lymphocytes Relative 13  12 - 46 (%)   Lymphs Abs 1.3  0.7 - 4.0 (K/uL)   Monocytes Relative 5  3 - 12 (%)   Monocytes Absolute 0.5  0.1 - 1.0 (K/uL)   Eosinophils Relative 2  0 - 5 (%)   Eosinophils Absolute 0.2  0.0 - 0.7 (K/uL)   Basophils Relative 0  0 - 1 (%)   Basophils Absolute 0.0  0.0 - 0.1 (K/uL)  GLUCOSE, CAPILLARY     Status: Abnormal   Collection Time   07/10/11  5:13 AM      Component Value Range   Glucose-Capillary 148 (*) 70 - 99 (mg/dL)   Comment 1 Notify RN        Studies/Results: Dg Abd 2  Views  07/09/2011  *RADIOLOGY REPORT*  Clinical Data: Small bowel obstruction  ABDOMEN - 2 VIEW  Comparison: Radiographs and CT abdomen 07/06/2011  Findings: Tip of nasogastric tube is within the stomach though the proximal side port is above the gastroesophageal junction. Persistent dilatation of small bowel loops compatible with small bowel obstruction, with increase in diameter of the a small bowel loop in the left mid abdomen since previous study. Prior ventral hernia repair. Upper normal bowel wall thickness is seen at a small bowel loop in the right mid abdomen. Paucity of colonic gas. Bones demineralized. Prior stenting of right iliac arteries. Orthopedic hardware right femur.  IMPRESSION: Slightly increased small bowel distention compatible with persistent small bowel obstruction. Recommend advance nasogastric tube 5 cm to position proximal side port within stomach.  Original Report Authenticated By: Lollie Marrow, M.[name].    Medications:  Scheduled:   . antiseptic oral rinse  15 mL Mouth Rinse q12n4p  . atorvastatin  10 mg Oral q1800  . chlorhexidine  15 mL Mouth Rinse BID  . ertapenem  1 g Intravenous Q24H  . heparin subcutaneous  5,000 Units Subcutaneous Q8H  . insulin aspart  0-9 Units Subcutaneous Q6H  . lip balm  1 application Topical BID  . magnesium sulfate 1 - 4 g bolus IVPB  4 g Intravenous Once  . pantoprazole (PROTONIX) IV  40 mg Intravenous Q24H  . potassium chloride  10 mEq Intravenous Q1 Hr x 5  . potassium chloride  10 mEq Intravenous Q1 Hr x 6  . sodium chloride  10-40 mL Intracatheter Q12H   Continuous:   . sodium chloride 75 mL/hr at 07/09/11 1900  . sodium chloride    . diltiazem (CARDIZEM) infusion Stopped (07/10/11 0908)  . TPN (  CLINIMIX) +/- additives     And  . fat emulsion    . TPN [facility]) +/- additives Stopped (07/10/11 0908)  . DISCONTD: sodium chloride 100 mL/hr at 07/09/11 0600  . DISCONTD: sodium chloride      Assessment/Plan: Small bowel  obstruction, patient for operating room. Fecal impaction resolved Acute on chronic renal insufficiency Status post repair right hip fracture History of PE Result GI bleed Hypokalemia being repleted IV Malnutrition on TNA  LOS: 16 days   [name] 07/10/2011, 9:25 AM

## 2011-07-10 NOTE — Progress Notes (Signed)
eLink Physician-Brief Progress Note Patient Name: [name] DOB: Jun 19, 1933 MRN: [id number]  Date of Service  07/10/2011   HPI/Events of Note  Patient s/p surgery - had been on dilt gtt for HR control (on dilt at home at 180 mg daily).  Now off Cardizem with HR sinus tach at 144.  BP has been stable but now somewhat lower due to dosing of fentanyl.  Temp elevated at 101 and no order for maintenance IVFs.  Also reporting reduction in UOP.   eICU Interventions  Plan: 1. Start Cardizem at 5 mg/hr 2. PRN tylenol order 3. IVF order 4. Suspect Oliguria multifactorial - monitor    Intervention Category Intermediate Interventions: Arrhythmia - evaluation and management Minor Interventions: Routine modifications to care plan (e.g. PRN medications for pain, fever)  Apolonia Ellwood 07/10/2011, 9:46 PM

## 2011-07-10 NOTE — Discharge Instructions (Signed)

## 2011-07-10 NOTE — Anesthesia Postprocedure Evaluation (Addendum)
  Anesthesia Post-op Note  Patient: [name]  Procedure(s) Performed: Procedure(s) (LRB): LAPAROSCOPY DIAGNOSTIC (N/A) LYSIS OF ADHESION (N/A) IMPACTION REMOVAL (N/A)  Patient Location: PACU  Anesthesia Type: General  Level of Consciousness: awake and alert   Airway and Oxygen Therapy: Patient Spontanous Breathing  Post-op Pain: mild  Post-op Assessment: Post-op Vital signs reviewed, Patient's Cardiovascular Status Stable, Respiratory Function Stable, Patent Airway and No signs of Nausea or vomiting  Post-op Vital Signs: stable  Complications: Patient had some desaturation towards the end of his procedure. ETT was suctioned, and pulled back with improvement of saturation. At emergence, he was quite tachypneic (40), Minute volume 10 L/M . Moved all 4 extremities to command.  Kept him intubated. CXR shows possible aspiration. No aspiration event witnessed today. Plan is to keep intubated at this time. Intensivists will manage him. I attempted to talk to his daughter, who was not in waiting room.

## 2011-07-10 NOTE — Progress Notes (Signed)
7 Days Post-Op  Subjective: Pt. Still having some BM, but abdomen still distended.    Objective: Vital signs in last 24 hours: Temp:  [97.4 F (36.3 C)-98.5 F (36.9 C)] 98.5 F (36.9 C) (03/08 0000) Pulse Rate:  [104-124] 118  (03/08 0400) Resp:  [15-26] 20  (03/08 0400) BP: (131-156)/(56-72) 143/66 mmHg (03/08 0400) SpO2:  [95 %-99 %] 99 % (03/08 0400) Last BM Date: 07/29/11  Intake/Output from previous day: 2022-07-29 0701 - 03/08 0700 In: 3811.7 [P.O.:120; I.V.:2605; IV Piggyback:400; TPN:446.7] Out: 2475 [Urine:2375; Emesis/NG output:100] Intake/Output this shift:    PE:  Alert, tachycardic, Chest: clear  Abd:  Still distended, sl tender, BS hyperactive.    Lab Results:   Basename 07/10/11 0510 July 29, 2011 0340  WBC 9.9 7.8  HGB 9.1* 9.3*  HCT 28.3* 29.3*  PLT 352 352    Lab 07/10/11 0510 07-29-11 0340 07/04/11 0919  AST 17 17 19   ALT 15 14 15   ALKPHOS 89 89 77  BILITOT 0.7 0.7 0.7  PROT 5.5* 5.4* 6.5  ALBUMIN 1.9* 2.0* 2.5*    BMET  Basename 07/10/11 0510 2011/07/29 0340  NA 140 146*  K 2.9* 3.0*  CL 109 112  CO2 24 18*  GLUCOSE 152* 84  BUN 9 14  CREATININE 0.97 1.15  CALCIUM 7.9* 8.1*   PT/INR  Basename 07/10/11 0510 2011/07/29 0340  LABPROT 15.4* 15.4*  INR 1.19 1.19     Studies/Results: Dg Abd 2 Views  2011-07-29  *RADIOLOGY REPORT*  Clinical Data: Small bowel obstruction  ABDOMEN - 2 VIEW  Comparison: Radiographs and CT abdomen 07/06/2011  Findings: Tip of nasogastric tube is within the stomach though the proximal side port is above the gastroesophageal junction. Persistent dilatation of small bowel loops compatible with small bowel obstruction, with increase in diameter of the a small bowel loop in the left mid abdomen since previous study. Prior ventral hernia repair. Upper normal bowel wall thickness is seen at a small bowel loop in the right mid abdomen. Paucity of colonic gas. Bones demineralized. Prior stenting of right iliac arteries. Orthopedic  hardware right femur.  IMPRESSION: Slightly increased small bowel distention compatible with persistent small bowel obstruction. Recommend advance nasogastric tube 5 cm to position proximal side port within stomach.  Original Report Authenticated By: Lollie Marrow, M.D.    Anti-infectives: Anti-infectives     Start     Dose/Rate Route Frequency Ordered Stop   06/26/11 2000   ceFAZolin (ANCEF) IVPB 1 g/50 mL premix        1 g 100 mL/hr over 30 Minutes Intravenous Every 6 hours 06/26/11 1658 06/27/11 0857         Current Facility-Administered Medications  Medication Dose Route Frequency Provider Last Rate Last Dose  . 0.9 %  sodium chloride infusion   Intravenous Continuous Katy Apo, MD 75 mL/hr at 2011-07-29 1900    . acetaminophen (TYLENOL) tablet 650 mg  650 mg Oral Q6H PRN Katy Apo, MD      . antiseptic oral rinse (BIOTENE) solution 15 mL  15 mL Mouth Rinse q12n4p Katy Apo, MD   15 mL at Jul 29, 2011 1600  . atorvastatin (LIPITOR) tablet 10 mg  10 mg Oral q1800 Katy Apo, MD   10 mg at 29-Jul-2011 1853  . bisacodyl (DULCOLAX) suppository 10 mg  10 mg Rectal Q12H PRN Ardeth Sportsman, MD      . chlorhexidine (PERIDEX) 0.12 % solution 15 mL  15 mL Mouth Rinse  BID Katy Apo, MD   15 mL at 07/09/11 2030  . diltiazem (CARDIZEM) 100 mg in dextrose 5 % 100 mL infusion  5-15 mg/hr Intravenous Titrated Katy Apo, MD 15 mL/hr at 07/09/11 1600 15 mg/hr at 07/09/11 1600  . heparin injection 5,000 Units  5,000 Units Subcutaneous Q8H Katy Apo, MD   5,000 Units at 07/10/11 [phone]  . HYDROcodone-acetaminophen (NORCO) 5-325 MG per tablet 2 tablet  2 tablet Oral Q4H PRN Katy Apo, MD   2 tablet at 07/09/11 1355  . insulin aspart (novoLOG) injection 0-9 Units  0-9 Units Subcutaneous Q6H Theda Sers, PHARMD   1 Units at 07/10/11 [phone]  . lip balm (CARMEX) ointment 1 application  1 application Topical BID Ardeth Sportsman, MD   1 application at 07/09/11 2220  .  magic mouthwash  15 mL Oral QID PRN Ardeth Sportsman, MD      . magnesium sulfate IVPB 4 g 100 mL  4 g Intravenous Once Ardeth Sportsman, MD      . menthol-cetylpyridinium (CEPACOL) lozenge 3 mg  1 lozenge Oral PRN Ardeth Sportsman, MD      . methocarbamol (ROBAXIN) 500 mg in dextrose 5 % 50 mL IVPB  500 mg Intravenous Q6H PRN [name], PA      . metoCLOPramide (REGLAN) injection 5-10 mg  5-10 mg Intravenous Q8H PRN [name], PA   5 mg at 07/05/11 1730  . morphine 2 MG/ML injection 2 mg  2 mg Intravenous Q4H PRN Duke Salvia, MD   2 mg at 07/09/11 1908  . ondansetron (ZOFRAN) injection 4 mg  4 mg Intravenous Q6H PRN Michelle A. Ashley Royalty, MD   4 mg at 07/05/11 1448  . pantoprazole (PROTONIX) injection 40 mg  40 mg Intravenous Q24H Katy Apo, MD   40 mg at 07/09/11 1300  . phenol (CHLORASEPTIC) mouth spray 2 spray  2 spray Mouth/Throat PRN Ardeth Sportsman, MD      . potassium chloride 10 mEq in 100 mL IVPB  10 mEq Intravenous Q1 Hr x 5 Thuyvan Thi Phan, PHARMD   10 mEq at 07/09/11 1223  . potassium chloride 10 mEq in 100 mL IVPB  10 mEq Intravenous Q1 Hr x 6 Ardeth Sportsman, MD      . sodium chloride 0.9 % injection 10-40 mL  10-40 mL Intracatheter Q12H Katy Apo, MD   10 mL at 07/09/11 2220  . sodium chloride 0.9 % injection 10-40 mL  10-40 mL Intracatheter PRN Katy Apo, MD      . tpn solution (CLINIMIX E 5/15) 1,000 mL infusion   Intravenous Continuous TPN Theda Sers, PHARMD 40 mL/hr at 07/09/11 1750    . DISCONTD: 0.9 %  sodium chloride infusion   Intravenous Continuous Thuyvan Thi Phan, PHARMD 100 mL/hr at 07/09/11 0600    . DISCONTD: 0.9 %  sodium chloride infusion   Intravenous Continuous Thuyvan Stacey Drain, PHARMD      . DISCONTD: ADULT TPN   Intravenous Continuous TPN Katy Apo, MD        Assessment/Plan SBO unresolved-- Hypokalemia, low Mg PE (pulmonary embolism)  Chronic anticoagulation  Fall from standing  Fracture of hip, right, closed    GI bleed thought to be due to diverticular disease.  Fecal Impaction  Acute on Chronic renal insuffiency  CT showing SBO, gas and fluid throughout colon; possible ileus or diarrhea related.  Distended Urinary bladder, with  ureteral dilatation and renal collecting system dilatations. Now with foley in place.   Plan:  Replace K+, and MG+, will tenatively plan surgery today.  Discussed with pt.;  Dr. Nehemiah Settle present and agree's with plan.Risk discussed with patient.    LOS: 16 days    [name] 07/10/2011

## 2011-07-10 NOTE — Progress Notes (Signed)
PT Cancellation Note  ___Treatment cancelled today due to medical issues with patient which prohibited therapy  ___ Treatment cancelled today due to patient receiving procedure or test   ___ Treatment cancelled today due to patient's refusal to participate   _X_ Treatment cancelled today due to surgery  Felecia Shelling  PTA [facility]  Acute  Rehab Pager     [phone]

## 2011-07-10 NOTE — Progress Notes (Addendum)
Pt still distended. No flatus. No definite BM last 24hr Xrays w/o improvement  Pt has failed non-op management - needs exploration.  Will try laparoscopically but distention may limit ability & will need to covert to open  The anatomy & physiology of the digestive tract was discussed.  The pathophysiology of intestinal obstruction was discussed.  Natural history risks without surgery was discussed.   I feel the patient has failed non-operative therapies.  The risks of no intervention will lead to serious problems such as necrosis, perforation, dehydration, etc. that outweigh the operative risks; therefore, I recommended abdominal exploration to diagnose & treat the source of the problem.  Laparoscopic & open techniques were discussed.   I expressed a good likelihood that surgery will treat the problem.  Risks such as bleeding, infection, abscess, leak, reoperation, bowel resection, possible ostomy, hernia, heart attack, death, and other risks were discussed.   I noted a good likelihood this will help address the problem.  Goals of post-operative recovery were discussed as well.  We will work to minimize complications. Questions were answered.  The patient expresses understanding & wishes to proceed with surgery.  I discussed the patient's status to the pt's daughter.  Numerous questions were answered.  She expressed understanding & appreciation.  -Low K & Mg - needs aggressive repletion

## 2011-07-10 NOTE — Progress Notes (Addendum)
MEDICATION RELATED CONSULT NOTE - INITIAL   Pharmacy Consult for IV Heparin/Vancomycin/Zosyn Indication: PE/Suspected pneumonia  No Known Allergies  Patient Measurements: Height: 5\' 6"  (167.6 cm) Weight: 145 lb 8.1 oz (66 kg) IBW/kg (Calculated) : 63.8   Vital Signs: Temp: 101.8 F (38.8 C) (03/08 1600) Temp src: Oral (03/08 1600) BP: 106/60 mmHg (03/08 1545) Pulse Rate: 108  (03/08 1545)  Labs:  Basename 07/10/11 0510 07/09/11 0340 07/08/11 0340  WBC 9.9 7.8 --  HGB 9.1* 9.3* --  HCT 28.3* 29.3* --  PLT 352 352 --  APTT -- -- --  CREATININE 0.97 1.15 1.47*  LABCREA -- -- --  CREATININE 0.97 1.15 1.47*  CREAT24HRUR -- -- --  MG 1.3* 1.5 --  PHOS 2.3 -- --  ALBUMIN 1.9* 2.0* --  PROT 5.5* 5.4* --  ALBUMIN 1.9* 2.0* --  AST 17 17 --  ALT 15 14 --  ALKPHOS 89 89 --  BILITOT 0.7 0.7 --  BILIDIR -- -- --  IBILI -- -- --   Estimated Creatinine Clearance: 59.4 ml/min (by C-G formula based on Cr of 0.97).   Microbiology: Recent Results (from the past 720 hour(s))  MRSA PCR SCREENING     Status: Normal   Collection Time   06/26/11 12:10 PM      Component Value Range Status Comment   MRSA by PCR NEGATIVE  NEGATIVE  Final   MRSA PCR SCREENING     Status: Normal   Collection Time   07/03/11  4:21 AM      Component Value Range Status Comment   MRSA by PCR NEGATIVE  NEGATIVE  Final     Medical History: Past Medical History  Diagnosis Date  . Asthma   . Hypertension   . Pulmonary embolism   . Peripheral artery disease   . Hypercholesteremia   . Bursitis of right shoulder   . DVT of leg (deep venous thrombosis)    Assessment: 77yom with persistent small bowel obstruction s/p lysis of adhesions, now with acute respiratory failure with differential of suspected pneumonia and/or PE (has hx of PE dx in July 2012).  New CXR: right infiltrate/atx.  Will begin IV heparin and abx.  CrCl~59 mL/min.  INR 1.19, H/H low but stable.  Goal of Therapy:  Vancomycin trough 15-  20 Heparin level 0.3-0.7  Plan:   Vancomycin 500 mg IV q12h.   Zosyn 3.375g IV q8h (4 hour infusion time).  Heparin (no bolus), start rate at 1100 units/hr.  Obtain heparin level 8 hours after start.  [name] 07/10/2011,4:33 PM

## 2011-07-10 NOTE — Progress Notes (Signed)
Try to see if will improve w/o surgery. If not better consistently, OR tomorrow

## 2011-07-10 NOTE — Progress Notes (Signed)
PARENTERAL NUTRITION CONSULT NOTE - FOLLOW UP  Pharmacy Consult for TNA Indication: SBO  No Known Allergies  Patient Measurements: Height: 5\' 6"  (167.6 cm) Weight: 145 lb 8.1 oz (66 kg) IBW/kg (Calculated) : 63.8  Usual Weight: unable to assess  Vital Signs: Temp: 98.5 F (36.9 C) (03/08 0000) Temp src: Oral (03/08 0000) BP: 143/66 mmHg (03/08 0400) Pulse Rate: 118  (03/08 0400) Intake/Output from previous day: 03/07 0701 - 03/08 0700 In: 3811.7 [P.O.:120; I.V.:2605; IV Piggyback:400; TPN:446.7] Out: 2475 [Urine:2375; Emesis/NG output:100] Intake/Output from this shift:    Labs:  Basename 07/10/11 0510 07/09/11 0340 07/08/11 0340 07/07/11 0910  WBC 9.9 7.8 -- --  HGB 9.1* 9.3* -- 10.0*  HCT 28.3* 29.3* -- 30.9*  PLT 352 352 -- --  APTT -- -- -- --  INR 1.19 1.19 1.21 --     Basename 07/10/11 0510 07/09/11 0340 07/08/11 0340  NA 140 146* 148*  K 2.9* 3.0* 3.4*  CL 109 112 115*  CO2 24 18* 21  GLUCOSE 152* 84 93  BUN 9 14 23   CREATININE 0.97 1.15 1.47*  LABCREA -- -- --  CREAT24HRUR -- -- --  CALCIUM 7.9* 8.1* 8.2*  MG 1.3* 1.5 --  PHOS 2.3 -- --  PROT 5.5* 5.4* --  ALBUMIN 1.9* 2.0* --  AST 17 17 --  ALT 15 14 --  ALKPHOS 89 89 --  BILITOT 0.7 0.7 --  BILIDIR -- -- --  IBILI -- -- --  PREALBUMIN -- -- --  TRIG 115 -- --  CHOLHDL -- -- --  CHOL 76 -- --  Corrected calcium = 9.58 Estimated Creatinine Clearance: 59.4 ml/min (by C-G formula based on Cr of 0.97).    Basename 07/10/11 0513 07/09/11 2313 07/09/11 1854  GLUCAP 148* 122* 93    Medications:  Scheduled:    . antiseptic oral rinse  15 mL Mouth Rinse q12n4p  . atorvastatin  10 mg Oral q1800  . chlorhexidine  15 mL Mouth Rinse BID  . heparin subcutaneous  5,000 Units Subcutaneous Q8H  . insulin aspart  0-9 Units Subcutaneous Q6H  . lip balm  1 application Topical BID  . magnesium sulfate 1 - 4 g bolus IVPB  4 g Intravenous Once  . pantoprazole (PROTONIX) IV  40 mg Intravenous Q24H  .  potassium chloride  10 mEq Intravenous Q1 Hr x 5  . potassium chloride  10 mEq Intravenous Q1 Hr x 6  . sodium chloride  10-40 mL Intracatheter Q12H   Infusions:  NS at 75 ml/hr Diltiazem drip 15mg /hr  Insulin Requirements in the past 24 hours:  2 units  Current Nutrition:  Clinimix E5/15 at 12ml/hr  Assessment:  77 YOM s/p ORIF 2/22, GI bleed 2/28 thought to be 2/2 diverticular disease, CT 3/4 consistent with SBO. NG tube with significant output, plan to start TNA. Last meal recorded 2/28.  K+ remains low despite replacement 3/7, 6 runs ordered today per MD  Mg++ low, 4gm replacement ordered today per MD  Prealbumin pending  CBGs near goal <150  Adjust goal rate to meet nutritional goals as recommended per dietician   Nutritional Goals:   1850-2110 kCal, 79-92 grams of protein per day  Clinimix E5/20 37ml/hr + 20% Lipids 13ml/hr will provide 90gm protein, 1584 kcal on non-lipid days, 2064 kcal on lipid days, average 1790 kcal/week  Plan:   Change Clinimix to E5/20, advance rate to 39ml/hr Fat emulsion 10 ml/hr(MWF only due to ongoing shortage). TNA to contain  standard multivitamins and trace elements(MWF only due to ongoing shortage). Reduce IVF to 9ml/hr. Continue CBG/SSI q6h TNA lab panels on Mondays & Thursdays.   Loralee Pacas, PharmD, BCPS Pager: [phone] 07/10/2011,7:15 AM

## 2011-07-10 NOTE — Consult Note (Signed)
Name: [name] MRN: [id number] DOB: 28-Oct-1933    LOS: 16  [facility]  History of Present Illness:  78 YO male w/ history of PE, and renal insuff.,  who was admitted on 2/20 s/p a fall resulting in right hip fracture which was repaired by Ortho on 2/22. Post-op he developed GIB and bowel obstruction. He was followed conservatively, but ultimately went for Exploratory lap on 3/8, he underwent lysis of adhesion in the OR. Towards the end-of the surgical case anesthesia reported some difficulty with oxygenation. In the PACU he was unable to be extubated due to rising FIO2 requirements and increased respiratory distress. PCCM was asked to evaluate.   Lines / Drains: OETT 3/8>>> PICC line 3/7>>>  Cultures: Sputum 3/8>>> procalcitonin 3/8>>>  Antibiotics: invanz 3/8 (abd coverage)>>>3/8 Zosyn 3/8 (HCAP)>>> vanc 3/8 (HCAP)>>>  Tests / Events: 2/22 right hip ORIF 3/8 expl lap w/ lysis of adhesion 3/8 LE dopplers>>>  The patient is sedated, intubated and unable to provide history, which was obtained for available medical records.    Past Medical History  Diagnosis Date  . Asthma   . Hypertension   . Pulmonary embolism   . Peripheral artery disease   . Hypercholesteremia   . Bursitis of right shoulder   . DVT of leg (deep venous thrombosis)    Past Surgical History  Procedure Date  . Hernia repair   . Appendectomy   . Femoral-femoral bypass graft   . Iliac artery stent   . Esophagogastroduodenoscopy 07/03/2011    Procedure: ESOPHAGOGASTRODUODENOSCOPY (EGD);  Surgeon: Shirley Friar, MD;  Location: [facility];  Service: Endoscopy;  Laterality: N/A;  . Compression hip screw 06/26/2011    Procedure: COMPRESSION HIP;  Surgeon: Raymon Mutton, MD;  Location: [facility];  Service: Orthopedics;  Laterality: Right;   Prior to Admission medications   Medication Sig Start Date End Date Taking? Authorizing Provider  clopidogrel (PLAVIX) 75 MG tablet Take 75 mg  by mouth daily.   Yes Historical Provider, MD  diltiazem (DILACOR XR) 180 MG 24 hr capsule Take 180 mg by mouth daily.   Yes Historical Provider, MD  metoprolol (LOPRESSOR) 100 MG tablet Take 100 mg by mouth 2 (two) times daily.   Yes Historical Provider, MD  pantoprazole (PROTONIX) 40 MG tablet Take 40 mg by mouth daily.   Yes Historical Provider, MD  simvastatin (ZOCOR) 20 MG tablet Take 10 mg by mouth every evening. Pt cuts tab in half for 10 mg dose   Yes Historical Provider, MD  warfarin (COUMADIN) 5 MG tablet Take 2.5-5 mg by mouth See admin instructions. Pt takes 1/2 of 5 mg  Tablet for 2.5 mg dose on Monday,Wednesday,Friday,sunday. On Tuesday,Thursday, Saturday pt takes 5 mg.   Yes Historical Provider, MD   Allergies No Known Allergies  Family History History reviewed. No pertinent family history.  Social History  reports that he has quit smoking. He has never used smokeless tobacco. He reports that he does not drink alcohol or use illicit drugs.  Review Of Systems  11 points review of systems is negative with an exception of listed in HPI.  Vital Signs: Temp:  [97.9 F (36.6 C)-98.6 F (37 C)] 98.6 F (37 C) (03/08 1430) Pulse Rate:  [101-126] 102  (03/08 1515) Resp:  [15-38] 24  (03/08 1515) BP: (106-156)/(56-72) 106/57 mmHg (03/08 1515) SpO2:  [92 %-100 %] 100 % (03/08 1515) FiO2 (%):  [100 %] 100 % (03/08 1515)      .  sodium chloride 75 mL/hr at 07/09/11 1900  . sodium chloride    . TPN (CLINIMIX) +/- additives     And  . fat emulsion    . TPN [facility]) +/- additives Stopped (07/10/11 0908)  . DISCONTD: diltiazem (CARDIZEM) infusion Stopped (07/10/11 0908)   I/O last 3 completed shifts: In: 5791.7 [P.O.:360; I.V.:4265; Other:240; IV Piggyback:400] Out: 3650 [Urine:3550; Emesis/NG output:100]  Physical Examination: General:  Chronically ill appearing AAM with marked accessory muscle use on TBar Neuro:  Awake, follow commands HEENT:  Orally intubated. No  JVD Cardiovascular:  Tachy rrr Lungs:  Diffuse rhonchi Abdomen:  abd tender, mid abd incision CD&I Musculoskeletal:  intact  Ventilator settings: Vent Mode:  [-]  FiO2 (%):  [100 %] 100 %  Labs and Imaging:   Lab 07/10/11 0510 07/09/11 0340 07/08/11 0340  NA 140 146* 148*  K 2.9* 3.0* 3.4*  CL 109 112 115*  CO2 24 18* 21  BUN 9 14 23   CREATININE 0.97 1.15 1.47*  GLUCOSE 152* 84 93    Lab 07/10/11 0510 07/09/11 0340 07/07/11 0910 07/04/11 0919  HGB 9.1* 9.3* 10.0* --  HCT 28.3* 29.3* 30.9* --  WBC 9.9 7.8 -- 11.3*  PLT 352 352 -- 335    Assessment and Plan:  SBO (small bowel obstruction) now s/p exp lap with lysis of adhesions 3/8  Acute respiratory failure with Post-op hypoxia: CXR showing new right sided infiltrate vs Atelectasis. Does have history of PE (pulmonary embolism diagnosed in July 2012). He has no h/o DVT, has been on anticoagulation up until bleeding noted on 2/27. Think acute post-op hypoxia is explained by new chest-xray finding: right infiltrate/atx. HCAP vs aspiration on list of diff dx.  Plan: -full vent support -intermittent sedation -scheduled BDs -sputum culture -HCAP coverage -check LE dopplers -will go ahead and resume IV heparin, doubt PE but certainly a possibility (discussed this with surgery: will not bolus)  Fracture of hip, right, closed 2/22 Plan: -post-op care per ortho  A-fib, now NSR. Suspect this was due to demand of acute illness Plan: -cont tele monitoring   Hypokalemia  Lab 07/10/11 0510 07/09/11 0340 07/08/11 0340  K 2.9* 3.0* 3.4*  plan: -recheck and replace, got 5 runs earlier  H/o renal insuff Recent Labs  [facility] 07/10/11 0510 07/09/11 0340 07/08/11 0340   CREATININE 0.97 1.15 1.47*  plan: -monitor, keep euvolemic.   Best practices / Disposition: -->ICU status  -->full code -->IV Heparin  -->Protonix for GI Px -->ventilator bundle -    BABCOCK,PETE 07/10/2011, 3:42 PM   Levy Pupa, MD,  PhD 07/10/2011, 7:02 PM [facility] [phone] or if no answer [phone]

## 2011-07-10 NOTE — Brief Op Note (Signed)
06/24/2011 - 07/10/2011  2:39 PM  PATIENT:  [name]  78 y.o. male  Patient Care Team: Katy Apo, MD as PCP - General (Internal Medicine)  PRE-OPERATIVE DIAGNOSIS:  bowel obstruction  POST-OPERATIVE DIAGNOSIS:  bowel obstruction  PROCEDURE:  Procedure(s) (LRB): LAPAROSCOPY DIAGNOSTIC (N/A) LYSIS OF ADHESION (N/A) IMPACTION REMOVAL (N/A)  SURGEON:  Surgeon(s) and Role:    * Ardeth Sportsman, MD - Primary  PHYSICIAN ASSISTANT:   ASSISTANTS: none   ANESTHESIA:   local and general  EBL:  Total I/O In: 2960 [I.V.:2630; IV Piggyback:250; TPN:80] Out: 1550 [Urine:1450; Blood:100]  BLOOD ADMINISTERED:none  DRAINS: none   LOCAL MEDICATIONS USED:  BUPIVICAINE   SPECIMEN:  No Specimen  DISPOSITION OF SPECIMEN:  N/A  COUNTS:  YES  TOURNIQUET:  * No tourniquets in log *  DICTATION: .Other Dictation: Dictation Number [id number]  PLAN OF CARE: Admit to inpatient   PATIENT DISPOSITION:  ICU - intubated and critically ill.   Delay start of Pharmacological VTE agent (>24hrs) due to surgical blood loss or risk of bleeding: no

## 2011-07-10 NOTE — Op Note (Signed)
[name], [name] NO.:  [id number]  MEDICAL RECORD NO.:  [id number]  LOCATION:  [facility]                         FACILITY:  [facility]  PHYSICIAN:  Ardeth Sportsman, MD     DATE OF BIRTH:  02-Jul-1933  DATE OF PROCEDURE:  07/10/2011 DATE OF DISCHARGE:                              OPERATIVE REPORT   PRIMARY CARE PHYSICIAN:  Deirdre Peer. Polite, M.D.  SURGEON:  Ardeth Sportsman, MD.  ASSIST:  RN.  PREOPERATIVE DIAGNOSIS:  Persistent small bowel obstruction, most likely secondary to adhesions.  POSTOPERATIVE DIAGNOSIS:  Persistent small bowel obstruction, most likely secondary to adhesions versus possible ileus.  PROCEDURE PERFORMED: 1. Laparoscopic lysis of adhesions x2 hours. 2. Open exploration with serosal repair. 3. Open exploration with enterotomy repair. 4. Fecal disimpaction.  ANESTHESIA: 1. General anesthesia. 2. Local anesthetic and a field block.  SPECIMEN:  None.  DRAINS:  None.  ESTIMATED BLOOD LOSS:  100 mL.  COMPLICATIONS:  None major.  INDICATIONS:  Mr. Cahalan is a 78 year old male with numerous health issues including chronic anticoagulation for a PE and chronic constipation.  He has had prior abdominal surgeries including vascular surgery bypasses, appendectomy, an incisional hernia repair in 2008 with Proceed mesh.  He came in with abdominal pain, nausea, vomiting.  It was initially felt to be secondary to severe constipation with fecal impaction.  We were consulted.  I recommend a CAT scan.  He had bowel obstruction.  I had a nasogastric tube in and got 1500 cc immediately back.  He initially seemed to improve with some loose stool.  However for the past 48 hours, he had no flatus or bowel movements and his abdomen is more distended. He is having abdominal discomfort.  I felt at this point with worsening status and failure to progress that he required operative exploration.  Differential diagnosis discussed.  Options discussed and   recommendation is made for laparoscopic possible open lysis of adhesions, possible bowel resection.  I have discussed it with the patient numerous times. I have discussed it with the daughter at length just prior to surgery. Questions answered and they agreed to proceed.  OPERATIVE FINDINGS:  The patient had dense adhesions to his Proceed mesh, primarily omental.  Small bowel had some adhesions, but that was not the transition point.  His major transition point was down in the pelvis in the left lower side.  Transition was a little subtle, but definite difference.  DESCRIPTION OF PROCEDURE:  Informed consent was confirmed.  The patient underwent general anesthesia without any difficulty.  I rotated him right side down decubitus.  I did rectal examination.  I found a moderate volume of firm hard stool in the rectal vault, which I was able to disimpact until he had no more stool in his rectum.  I believe this is the source of some of his impaction.  I saw no fissures or tears or other abnormalities.  Prostate felt slightly enlarged, but otherwise unremarkable.  No rectal masses or tumors or cancers felt up to 10 cm.  Patient was positioned supine with arms tucked.  Abdomen was prepped and draped in sterile fashion.  Surgical time-out confirmed our  plan.  I placed a #5-mm port in the right upper quadrant using optical entry technique.  Entry was clean.  I induced carbon dioxide insufflation. Camera inspection revealed no injury.  He had dense adhesions of omentum involving the central two-thirds of his anterior abdominal wall.  I placed a 5-0 port in the right flank and I placed another one in the right subxiphoid region.  I began to do sharp lysis of adhesions using cold scissors and occasional focussed cautery of omentum.  He had moderate adhesions.  I proceeded especially around the periphery where the old spiral tacks were.  This was primarily omentum, but I was able to free those  off.  There were some centrally within the large mesh, but those were softer and I was able to bluntly free those down.  Eventually over time, I was able to free the great omentum completely off the abdomen and reflected that out of the way.  I then encountered very dilated small bowel loops with moderate intraloop adhesions.  I gradually did focused sharp dissection with cold scissors laparoscopically as was some focus blunt dissection to free fold upon fold of small bowel out of the mid abdomen out of the way.  I eventually found some loops going down into the pelvis.  I freed off the left anterior pericolic gutter and freed that off sharply.  This seemed to be the transition point.  I further freed out some small bowel loops out of the right pelvis.  With that, I could find the ileocecal region and run the small bowel.  I ran the small bowel from the ileocecal valve more proximally.  I did see a few serosal tears.  There was more significant one, but no definite enterotomy is seen.  I eventually freed off some dense intraloop adhesions.  Fortunately, they were of soft cotton candy- type, but they were quite dense.  It took another hour of lysis of adhesions to free everything up, I re-ran the small bowel and saw no obvious twisting.  I did have to debride off moderate volume of omentum and some of it became necrotic.  I therefore, decided to place a 6-cm periumbilical incision and a wound protector in there.  With that, I could remove the necrotic omentum of moderate volume.  I ran the small bowel starting the mid bowel more distally.  I repaired some areas of serosal thinning.  I found 2 spots of pinhole enterotomy since just any pulling was difficult on this.  I repaired all these with 3-0 silk stitches transversely.  I did copious irrigation of over 4 L and the area was clear at the end.  I re-ran the small bowel 1 more time and saw no injury.  I was able to get to the ligament of   Treitz.  I could feel with my fingers, the ileocecal region.  I felt no mass or tumor.  I could run the transverse colon as well and felt no abnormalities down to the pelvis.  I could not feel any tumors or any other sources of obstruction.  He did not have much greater omentum left.  I used a "party pack" of Seprafilm.  The sheets, I believe, are 7 x 15 cm in length and laid them out in a radial pattern like a sunburst pattern underneath to cover up the old Proceed mesh.  I reapproximated the fascia using #1 running looped PDS.  I closed the skin about every 3 cm with a staple,  but left it partially open and packed with gently by a Betadine-soaked gauze.  I closed the port sites using staples.  Patient is hypoxic on the ventilator with the sats in the high 80s. Some secretions as well.  Anesthesia and I agreed to keep the patient intubated.  I have discussed with Zenia Resides with Pulmonary Critical Care and asked their opinion on helping manage the patient to stabilize. He may need re-anticoagulation to make sure he did not have a recurrent PE while he is off anticoagulation.  I discussed the patient's status to the family.  They had many questions that I answered.  They expressed understanding & appreciation.      Ardeth Sportsman, MD     SCG/MEDQ  D:  07/10/2011  T:  07/10/2011  Job:  [id number]

## 2011-07-10 NOTE — Transfer of Care (Signed)
Immediate Anesthesia Transfer of Care Note  Patient: Dominyck Reser  Procedure(s) Performed: Procedure(s) (LRB): LAPAROSCOPY DIAGNOSTIC (N/A) LYSIS OF ADHESION (N/A) IMPACTION REMOVAL (N/A)  Patient Location: PACU  Anesthesia Type: General  Level of Consciousness: awake and patient cooperative  Airway & Oxygen Therapy: Patient Spontanous Breathing and Patient connected to T-piece oxygen  Post-op Assessment: Report given to PACU RN and Post -op Vital signs reviewed and stable  Post vital signs: Reviewed and stable  Complications: respiratory complications congested/rapid rate/sm VT/ decreased O2sat

## 2011-07-11 ENCOUNTER — Encounter (INDEPENDENT_AMBULATORY_CARE_PROVIDER_SITE_OTHER): Payer: Self-pay | Admitting: Surgery

## 2011-07-11 ENCOUNTER — Inpatient Hospital Stay (HOSPITAL_COMMUNITY): Payer: Medicare HMO

## 2011-07-11 DIAGNOSIS — R0602 Shortness of breath: Secondary | ICD-10-CM

## 2011-07-11 LAB — CBC
HCT: 26.6 % — ABNORMAL LOW (ref 39.0–52.0)
HCT: 21.9 % — ABNORMAL LOW (ref 39.0–52.0)
Hemoglobin: 8.7 g/dL — ABNORMAL LOW (ref 13.0–17.0)
MCH: 28.2 pg (ref 26.0–34.0)
MCH: 29.2 pg (ref 26.0–34.0)
MCHC: 32.7 g/dL (ref 30.0–36.0)
MCV: 86.1 fL (ref 78.0–100.0)
MCV: 87.6 fL (ref 78.0–100.0)
RBC: 3.09 MIL/uL — ABNORMAL LOW (ref 4.22–5.81)
RDW: 17.4 % — ABNORMAL HIGH (ref 11.5–15.5)
WBC: 7.8 10*3/uL (ref 4.0–10.5)

## 2011-07-11 LAB — BLOOD GAS, ARTERIAL
Acid-Base Excess: 4.7 mmol/L — ABNORMAL HIGH (ref 0.0–2.0)
Drawn by: 244901
FIO2: 0.4 %
MECHVT: 520 mL
PEEP: 5 cmH2O
Patient temperature: 101.6
RATE: 14 resp/min
pCO2 arterial: 28.6 mmHg — ABNORMAL LOW (ref 35.0–45.0)

## 2011-07-11 LAB — PREPARE RBC (CROSSMATCH)

## 2011-07-11 LAB — BASIC METABOLIC PANEL
BUN: 19 mg/dL (ref 6–23)
CO2: 21 mEq/L (ref 19–32)
Calcium: 7.4 mg/dL — ABNORMAL LOW (ref 8.4–10.5)
Creatinine, Ser: 1.78 mg/dL — ABNORMAL HIGH (ref 0.50–1.35)
Glucose, Bld: 248 mg/dL — ABNORMAL HIGH (ref 70–99)
Sodium: 137 mEq/L (ref 135–145)

## 2011-07-11 LAB — GLUCOSE, CAPILLARY
Glucose-Capillary: 281 mg/dL — ABNORMAL HIGH (ref 70–99)
Glucose-Capillary: 255 mg/dL — ABNORMAL HIGH (ref 70–99)
Glucose-Capillary: 199 mg/dL — ABNORMAL HIGH (ref 70–99)

## 2011-07-11 LAB — HEPARIN LEVEL (UNFRACTIONATED): Heparin Unfractionated: 0.65 IU/mL (ref 0.30–0.70)

## 2011-07-11 MED ORDER — METOPROLOL TARTRATE 1 MG/ML IV SOLN
5.0000 mg | Freq: Once | INTRAVENOUS | Status: AC
  Administered 2011-07-11: 5 mg via INTRAVENOUS
  Filled 2011-07-11: qty 5

## 2011-07-11 MED ORDER — SODIUM CHLORIDE 0.9 % IV BOLUS (SEPSIS)
500.0000 mL | Freq: Once | INTRAVENOUS | Status: AC
  Administered 2011-07-11: 500 mL via INTRAVENOUS

## 2011-07-11 MED ORDER — INSULIN ASPART 100 UNIT/ML ~~LOC~~ SOLN
0.0000 [IU] | SUBCUTANEOUS | Status: DC
  Administered 2011-07-11: 5 [IU] via SUBCUTANEOUS
  Administered 2011-07-11: 3 [IU] via SUBCUTANEOUS
  Administered 2011-07-11 – 2011-07-12 (×2): 2 [IU] via SUBCUTANEOUS
  Administered 2011-07-12 (×2): 3 [IU] via SUBCUTANEOUS
  Administered 2011-07-12: 1 [IU] via SUBCUTANEOUS
  Administered 2011-07-12: 3 [IU] via SUBCUTANEOUS
  Administered 2011-07-12: 2 [IU] via SUBCUTANEOUS
  Administered 2011-07-12: 3 [IU] via SUBCUTANEOUS
  Administered 2011-07-13: 2 [IU] via SUBCUTANEOUS
  Administered 2011-07-13: 1 [IU] via SUBCUTANEOUS
  Administered 2011-07-13 – 2011-07-14 (×6): 2 [IU] via SUBCUTANEOUS
  Administered 2011-07-14: 3 [IU] via SUBCUTANEOUS
  Administered 2011-07-14 (×2): 2 [IU] via SUBCUTANEOUS
  Administered 2011-07-15: 3 [IU] via SUBCUTANEOUS
  Administered 2011-07-15 (×2): 2 [IU] via SUBCUTANEOUS
  Filled 2011-07-11: qty 3

## 2011-07-11 MED ORDER — CLINIMIX E/DEXTROSE (5/20) 5 % IV SOLN
INTRAVENOUS | Status: AC
  Administered 2011-07-11: 18:00:00 via INTRAVENOUS
  Filled 2011-07-11: qty 2000

## 2011-07-11 MED ORDER — VANCOMYCIN HCL IN DEXTROSE 1-5 GM/200ML-% IV SOLN
1000.0000 mg | INTRAVENOUS | Status: DC
  Administered 2011-07-12 – 2011-07-15 (×4): 1000 mg via INTRAVENOUS
  Filled 2011-07-11 (×4): qty 200

## 2011-07-11 MED ORDER — LACTATED RINGERS IV BOLUS (SEPSIS)
1000.0000 mL | Freq: Once | INTRAVENOUS | Status: AC
  Administered 2011-07-11: 1000 mL via INTRAVENOUS

## 2011-07-11 MED ORDER — METOPROLOL TARTRATE 1 MG/ML IV SOLN
5.0000 mg | Freq: Four times a day (QID) | INTRAVENOUS | Status: DC
  Administered 2011-07-11 – 2011-07-18 (×28): 5 mg via INTRAVENOUS
  Filled 2011-07-11 (×35): qty 5

## 2011-07-11 NOTE — Progress Notes (Signed)
VASCULAR LAB PRELIMINARY  PRELIMINARY  PRELIMINARY  PRELIMINARY  Bilateral lower extremity venous Dopplers completed.    Preliminary report:  There is no acute DVT or SVT noted in the bilateral lower extremities.  Chronic DVT noted in the bilateral common femoral and proximal femoral veins.  Sherren Kerns [place], 07/11/2011, 9:26 AM

## 2011-07-11 NOTE — Progress Notes (Signed)
ANTICOAGULATION CONSULT NOTE - Follow Up Consult  Pharmacy Consult for heparin Indication: PE  No Known Allergies  Patient Measurements: Height: 5\' 6"  (167.6 cm) Weight: 145 lb 8.1 oz (66 kg) IBW/kg (Calculated) : 63.8  Heparin Dosing Weight:   Vital Signs: Temp: 100.6 F (38.1 C) (03/09 0400) Temp src: Axillary (03/09 0400) BP: 86/54 mmHg (03/09 0200) Pulse Rate: 129  (03/09 0200)  Labs:  Basename 07/11/11 0425 07/11/11 0420 07/10/11 1730 07/10/11 0510 07/09/11 0340  HGB 8.7* -- -- 9.1* --  HCT 26.6* -- -- 28.3* 29.3*  PLT 298 -- -- 352 352  APTT -- -- -- -- --  LABPROT 17.4* -- -- 15.4* 15.4*  INR 1.40 -- -- 1.19 1.19  HEPARINUNFRC -- 0.65 -- -- --  CREATININE 1.78* -- 1.11 0.97 --  CKTOTAL -- -- -- -- --  CKMB -- -- -- -- --  TROPONINI -- -- -- -- --   Estimated Creatinine Clearance: 32.4 ml/min (by C-G formula based on Cr of 1.78).   Medications:  Infusions:    . sodium chloride 75 mL/hr at 07/09/11 1900  . sodium chloride 75 mL/hr (07/10/11 2252)  . diltiazem (CARDIZEM) infusion 15 mg/hr (07/11/11 0444)  . TPN (CLINIMIX) +/- additives 60 mL/hr at 07/10/11 2200   And  . fat emulsion 250 mL (07/10/11 2200)  . heparin 1,100 Units/hr (07/10/11 1946)  . TPN [facility]) +/- additives Stopped (07/10/11 0908)  . DISCONTD: sodium chloride    . DISCONTD: diltiazem (CARDIZEM) infusion Stopped (07/10/11 0908)    Assessment: Patient with 1st heparin level at goal.  No issues with drip per RN.   Goal of Therapy:  Heparin level 0.3-0.7 units/ml   Plan:  Continue with current drip rate, recheck at [address], [place] Rauda Crowford 07/11/2011,5:31 AM

## 2011-07-11 NOTE — Progress Notes (Signed)
1 Day Post-Op  Subjective: Some hypotension overnight. Also with low urine output.   Objective: Vital signs in last 24 hours: Temp:  [97.9 F (36.6 C)-101.8 F (38.8 C)] 100.6 F (38.1 C) (03/09 0400) Pulse Rate:  [101-140] 120  (03/09 0600) Resp:  [17-38] 21  (03/09 0600) BP: (80-145)/(46-72) 112/53 mmHg (03/09 0600) SpO2:  [92 %-100 %] 100 % (03/09 0600) FiO2 (%):  [40 %-100 %] 40 % (03/09 0600) Last BM Date: 07/10/11 (pre op)  Intake/Output from previous day: 03/08 0701 - 03/09 0700 In: 4745.1 [I.V.:3595.1; NG/GT:60; IV Piggyback:450; TPN:640] Out: 1820 [Urine:1720; Blood:100] Intake/Output this shift:    OE spont. Follows some command CTA ant, decreased BS at bases Tachy Distended, soft, no bowel sounds  Lab Results:   Basename 07/11/11 0425 07/10/11 0510  WBC 7.8 9.9  HGB 8.7* 9.1*  HCT 26.6* 28.3*  PLT 298 352   BMET  Basename 07/11/11 0425 07/10/11 1730  NA 137 139  K 3.7 4.0  CL 108 109  CO2 21 23  GLUCOSE 248* 169*  BUN 19 12  CREATININE 1.78* 1.11  CALCIUM 7.4* 7.5*   PT/INR  Basename 07/11/11 0425 07/10/11 0510  LABPROT 17.4* 15.4*  INR 1.40 1.19   ABG  Basename 07/11/11 0400 07/10/11 1640  PHART 7.435 7.439  HCO3 21.0 PENDING    Studies/Results: Dg Chest Port 1 View  07/11/2011  *RADIOLOGY REPORT*  Clinical Data: Check endotracheal tube.  PORTABLE CHEST - 1 VIEW  Comparison: 07/10/2011  Findings: Endotracheal tube is 2.9 cm above the carina.  PICC line tip in the SVC region.  Nasogastric tube extends into the abdomen. Stable hazy densities throughout the right lung and right lung base.  Probable small pleural effusions.  Heart size is stable. There may be slightly decreased edema in the left lung.  IMPRESSION: Minimal change in the appearance of the lungs.  Hazy densities in the right lung may represent a combination of edema and pleural fluid.  There may be slightly decreased edema in the left lung.  Support apparatuses as described.   Original Report Authenticated By: Richarda Overlie, M.D.   Dg Chest Port 1 View  07/10/2011  *RADIOLOGY REPORT*  Clinical Data: Tachypnea.  Status post intubation.  Question aspiration.  PORTABLE CHEST - 1 VIEW  Comparison: Portable chest 06/24/2011.  Findings: The the patient is intubated.  The endotracheal tube terminates 2.6 cm above the carina.  Mild interstitial edema is now present.  There are bilateral pleural effusions.  A right-sided PICC line terminates at the cavoatrial junction.  An NG tube courses off the inferior border of the film.  Biapical pleural parenchymal scarring is again noted.  Bibasilar airspace disease is slightly more prominent on the right.  IMPRESSION:  1.  Endotracheal tube terminates 2.6 cm above the carina, satisfactory position. 2.  Interval development of edema and bilateral pleural effusions, suggesting congestive heart failure. 3.  Satisfactory positioning of a right-sided PICC line. 4.  Right greater than left bibasilar airspace disease.  While this may represent atelectasis, early infection or even aspiration is not excluded.  Original Report Authenticated By: Jamesetta Orleans. MATTERN, M.D.    Anti-infectives: Anti-infectives     Start     Dose/Rate Route Frequency Ordered Stop   07/10/11 1800   vancomycin (VANCOCIN) 500 mg in sodium chloride 0.9 % 100 mL IVPB        500 mg 100 mL/hr over 60 Minutes Intravenous Every 12 hours 07/10/11 1641  07/10/11 1800  piperacillin-tazobactam (ZOSYN) IVPB 3.375 g       3.375 g 12.5 mL/hr over 240 Minutes Intravenous Every 8 hours 07/10/11 1708     07/10/11 0800   ertapenem (INVANZ) 1 g in sodium chloride 0.9 % 50 mL IVPB  Status:  Discontinued        1 g 100 mL/hr over 30 Minutes Intravenous Every 24 hours 07/10/11 0739 07/10/11 1659   06/26/11 2000   ceFAZolin (ANCEF) IVPB 1 g/50 mL premix        1 g 100 mL/hr over 30 Minutes Intravenous Every 6 hours 06/26/11 1658 06/27/11 0857          Assessment/Plan: s/p  Procedure(s) (LRB): LAPAROSCOPY DIAGNOSTIC (N/A) LYSIS OF ADHESION (N/A) IMPACTION REMOVAL (N/A) Repair serosal tear, enterotomy  Wean vent per CCM Cont bowel rest, NG tube to LIWS Monitor uop and Cr. May need a small fluid bolus.   [name]. Andrey Campanile, MD, FACS General, Bariatric, & Minimally Invasive Surgery [facility], [place]     LOS: 17 days    [name] 07/11/2011

## 2011-07-11 NOTE — Progress Notes (Addendum)
PARENTERAL NUTRITION CONSULT NOTE - FOLLOW UP  Pharmacy Consult for TNA Indication: SBO  No Known Allergies  Patient Measurements: Height: 5\' 6"  (167.6 cm) Weight: 145 lb 8.1 oz (66 kg) IBW/kg (Calculated) : 63.8  Usual Weight: unable to assess  Vital Signs: Temp: 100.6 F (38.1 C) (03/09 0400) Temp src: Axillary (03/09 0400) BP: 112/53 mmHg (03/09 0600) Pulse Rate: 120  (03/09 0600) Intake/Output from previous day: 03/08 0701 - 03/09 0700 In: 4745.1 [I.V.:3595.1; NG/GT:60; IV Piggyback:450; TPN:640] Out: 1820 [Urine:1720; Blood:100] Intake/Output from this shift:    Labs:  Basename 07/11/11 0425 07/10/11 0510 07/09/11 0340  WBC 7.8 9.9 7.8  HGB 8.7* 9.1* 9.3*  HCT 26.6* 28.3* 29.3*  PLT 298 352 352  APTT -- -- --  INR 1.40 1.19 1.19     Basename 07/11/11 0425 07/10/11 1730 07/10/11 0510 07/09/11 0340  NA 137 139 140 --  K 3.7 4.0 2.9* --  CL 108 109 109 --  CO2 21 23 24  --  GLUCOSE 248* 169* 152* --  BUN 19 12 9  --  CREATININE 1.78* 1.11 0.97 --  LABCREA -- -- -- --  CREAT24HRUR -- -- -- --  CALCIUM 7.4* 7.5* 7.9* --  MG 1.8 -- 1.3* 1.5  PHOS 2.7 -- 2.3 --  PROT -- -- 5.5* 5.4*  ALBUMIN -- -- 1.9* 2.0*  AST -- -- 17 17  ALT -- -- 15 14  ALKPHOS -- -- 89 89  BILITOT -- -- 0.7 0.7  BILIDIR -- -- -- --  IBILI -- -- -- --  PREALBUMIN -- -- 5.4* --  TRIG -- -- 115 --  CHOLHDL -- -- -- --  CHOL -- -- 76 --  Corrected calcium = 9.08 with alb = 1.9 Estimated Creatinine Clearance: 32.4 ml/min (by C-G formula based on Cr of 1.78).    Basename 07/11/11 0633 07/10/11 2333 07/10/11 1715  GLUCAP 281* 198* 163*    Medications:  Scheduled:     . antiseptic oral rinse  15 mL Mouth Rinse q12n4p  . chlorhexidine  15 mL Mouth Rinse BID  . insulin aspart  0-9 Units Subcutaneous Q6H  . lip balm  1 application Topical BID  . magnesium sulfate 1 - 4 g bolus IVPB  4 g Intravenous Once  . pantoprazole (PROTONIX) IV  40 mg Intravenous Q24H  .  piperacillin-tazobactam (ZOSYN)  IV  3.375 g Intravenous Q8H  . potassium chloride  10 mEq Intravenous Q1 Hr x 5  . potassium chloride  10 mEq Intravenous Q1 Hr x 6  . sodium chloride  10-40 mL Intracatheter Q12H  . vancomycin  500 mg Intravenous Q12H  . DISCONTD: atorvastatin  10 mg Oral q1800  . DISCONTD: ertapenem  1 g Intravenous Q24H  . DISCONTD: heparin subcutaneous  5,000 Units Subcutaneous Q8H   Infusions:  NS at 75 ml/hr Diltiazem drip 15mg /hr Heparin 1100 units/hr  Insulin Requirements in the past 24 hours:  11 units  Current Nutrition:  Clinimix E5/15 at 66ml/hr - resumed @ 10pm 3/8 postop Lipids 20% at 60ml/hr - resumed @ 10pm 3/8 postop NPO  Assessment:  77 YOM s/p ORIF 2/22, GI bleed 2/28 thought to be 2/2 diverticular disease, CT 3/4 consistent with SBO. NG tube with significant output, plan to start TNA. Last meal recorded 2/28.  POD#1 LOA, serosal/enterotomy repair, fecal dsimpaction  K+ and Mg improved after replacement 3/8  Baseline prealbumin 5.4  CBGs now rising above goal 150 post op  Scr rising post op,  will not advance TNA rate so as not to overshoot protein at this point - monitor.  Nutritional Goals:   1850-2110 kCal, 79-92 grams of protein per day  Clinimix E5/20 22ml/hr + 20% Lipids 9ml/hr will provide 90gm protein, 1584 kcal on non-lipid days, 2064 kcal on lipid days, average 1790 kcal/week  Plan:   Continue Clinimix to E5/20 at 34ml/hr Fat emulsion 10 ml/hr(MWF only due to ongoing shortage). TNA to contain standard multivitamins and trace elements(MWF only due to ongoing shortage). IVF per MD Increase CBG/SSI to q4h TNA lab panels on Mondays & Thursdays.   Loralee Pacas, PharmD, BCPS Pager: [phone] 07/11/2011,7:41 AM  Addendum: Day #2 Vancomycin 500mg  q12h, Zosyn 3.375gm IV q8h for PNA Scr 1.11>1.78, CrCl = 36 ml/min (normalized) UOP ~100cc since midnight Plan:  1. Empirically change vancomycin to 1gm IV q24h 2. Continue  current zosyn 3. Follow renal function closely and check levels as appropriate

## 2011-07-11 NOTE — Progress Notes (Signed)
Name: [name] MRN: [id number] DOB: Aug 07, 1933  ELECTRONIC ICU PHYSICIAN NOTE  Problem:  Noted by RN to have left hemiplegia.  Apparently first noted today 8 AM.  Last seen normal night before.  Intervention:  Out of time window for tPA and recent surgery.  Stat head CT.  Will make decision on Heparin gtt ( indication - Pulmonary Embolism) based on CT results.  Orlean Bradford, M.D. Pulmonary and Critical Care Medicine [facility] Cell: [phone] Pager: [phone]  07/11/2011, 8:32 PM

## 2011-07-11 NOTE — Progress Notes (Signed)
Extubated the patient after weaning on cpap and pressure support 5/5 for close to 2 hours.  He is now on 2 L nasal cannula o2 saturations are 98%.

## 2011-07-11 NOTE — Progress Notes (Signed)
Name: [name] MRN: [id number] DOB: Feb 10, 1934    LOS: 17  [place] [facility]  History of Present Illness:  78 YO male w/ history of PE, and renal insuff.,  who was admitted on 2/20 s/p a fall resulting in right hip fracture which was repaired by Ortho on 2/22. Post-op he developed GIB and bowel obstruction. He was followed conservatively, but ultimately went for Exploratory lap on 3/8, he underwent lysis of adhesion in the OR. Towards the end-of the surgical case anesthesia reported some difficulty with oxygenation. In the PACU he was unable to be extubated due to rising FIO2 requirements and increased respiratory distress. PCCM was asked to evaluate.   Lines / Drains: OETT 3/8>>> 3/9 PICC line 3/7>>>  Cultures: Sputum 3/8>>> procalcitonin 3/8>>>  5.31 >> 18.64  Antibiotics: invanz 3/8 (abd coverage)>>>3/8 Zosyn 3/8 (HCAP)>>> vanc 3/8 (HCAP)>>>  Tests / Events: 2/22 right hip ORIF 3/8 expl lap w/ lysis of adhesion 3/8 LE dopplers>>>  Vital Signs: Temp:  [97.9 F (36.6 C)-101.8 F (38.8 C)] 100.6 F (38.1 C) (03/09 0400) Pulse Rate:  [101-140] 120  (03/09 0600) Resp:  [17-38] 21  (03/09 0600) BP: (80-145)/(46-72) 112/53 mmHg (03/09 0600) SpO2:  [92 %-100 %] 100 % (03/09 0600) FiO2 (%):  [40 %-100 %] 40 % (03/09 0600)      . sodium chloride 75 mL/hr at 07/09/11 1900  . sodium chloride 75 mL/hr (07/10/11 2252)  . diltiazem (CARDIZEM) infusion 15 mg/hr (07/11/11 0444)  . TPN (CLINIMIX) +/- additives 60 mL/hr at 07/10/11 2200   And  . fat emulsion 250 mL (07/10/11 2200)  . heparin 1,100 Units/hr (07/10/11 1946)  . TPN [facility]) +/- additives Stopped (07/10/11 0908)  . DISCONTD: sodium chloride    . DISCONTD: diltiazem (CARDIZEM) infusion Stopped (07/10/11 0908)   I/O last 3 completed shifts: In: 6305.1 [I.V.:4675.1; NG/GT:60; IV Piggyback:450] Out: 3245 [Urine:3045; Emesis/NG output:100; Blood:100]  Physical Examination: General:  Chronically ill  appearing AAM with marked accessory muscle use on TBar Neuro:  Awake, follow commands HEENT:  Orally intubated. No JVD Cardiovascular:  Tachy rrr Lungs:  Diffuse rhonchi Abdomen:  abd tender, mid abd incision CD&I Musculoskeletal:  intact  Ventilator settings: Vent Mode:  [-] PRVC FiO2 (%):  [40 %-100 %] 40 % Set Rate:  [14 bmp] 14 bmp Vt Set:  [520 mL] 520 mL PEEP:  [5 cmH20] 5 cmH20 Plateau Pressure:  [16 cmH20-18 cmH20] 16 cmH20  Labs and Imaging:   Lab 07/11/11 0425 07/10/11 1730 07/10/11 0510  NA 137 139 140  K 3.7 4.0 2.9*  CL 108 109 109  CO2 21 23 24   BUN 19 12 9   CREATININE 1.78* 1.11 0.97  GLUCOSE 248* 169* 152*    Lab 07/11/11 0425 07/10/11 0510 07/09/11 0340  HGB 8.7* 9.1* 9.3*  HCT 26.6* 28.3* 29.3*  WBC 7.8 9.9 7.8  PLT 298 352 352    Assessment and Plan:  SBO (small bowel obstruction) now s/p exp lap with lysis of adhesions 3/8  Acute respiratory failure with Post-op hypoxia: CXR showing new right sided infiltrate vs Atelectasis. Does have history of PE (pulmonary embolism diagnosed in July 2012). He has no h/o DVT, has been on anticoagulation up until bleeding noted on 2/27. Think acute post-op hypoxia is explained by new chest-xray finding: right infiltrate/atx. HCAP vs aspiration on list of diff dx.  Plan: -full vent support, goal WUA and extubate 3/9 -intermittent sedation -scheduled BDs -sputum culture -HCAP coverage will consider d/c if CXR  clears and cx negative -check LE dopplers >>  -will go ahead and resume IV heparin, doubt new PE but certainly a possibility (discussed this with surgery:  no bolus)  Fracture of hip, right, closed 2/22 Plan: -post-op care per ortho  A-fib. With tachycardia o/n 3/8, suspect due to demand of acute illness, dilt gtt started 3/8 Plan: -cont tele monitoring  - anticipate weaning dilt to off once extubated.   Hypokalemia  Lab 07/11/11 0425 07/10/11 1730 07/10/11 0510  K 3.7 4.0 2.9*  plan: -follow and  replace  H/o renal insuff Recent Labs  Basename 07/11/11 0425 07/10/11 1730 07/10/11 0510   CREATININE 1.78* 1.11 0.97  plan: -follow UOP and S Cr  Best practices / Disposition: -->ICU status, will change to PCCM service if he remains intubated -->full code -->IV Heparin gtt -->Protonix for GI Px -->ventilator bundle   Levy Pupa, MD, PhD 07/11/2011, 7:10 AM [place] [facility] [phone] or if no answer [phone]

## 2011-07-11 NOTE — Progress Notes (Signed)
Name: [name] MRN: [id number] DOB: Jul 19, 1933  ELECTRONIC ICU PHYSICIAN NOTE  Problem:  Tachycardia (sinus) / mild respiratory disress  Intervention: -->  D/c Cardizem  (? Indication, currently not in atrial fibrillation) -->  Metoprolol 5 mg IV x 1 -->  NS bolus 500 mL x 1 -->  CVP to assess volume status -->  CBC now ( ? blood loss)  Orlean Bradford, M.D. Pulmonary and Critical Care Medicine [facility] Cell: [phone] Pager: [phone]  07/11/2011, 3:41 PM

## 2011-07-11 NOTE — Progress Notes (Signed)
Subjective: Extubated, speaking in quiet voice, answers questions appropriately. No acute complaints, does have some discomfort in abdomen.  Objective: Vital signs in last 24 hours: Temp:  [98.6 F (37 C)-101.8 F (38.8 C)] 100.4 F (38 C) (03/09 0800) Pulse Rate:  [101-140] 133  (03/09 1100) Resp:  [21-38] 24  (03/09 1100) BP: (80-130)/(46-65) 124/56 mmHg (03/09 1100) SpO2:  [92 %-100 %] 98 % (03/09 1100) FiO2 (%):  [30 %-100 %] 30 % (03/09 0900) Weight change:  Last BM Date: 07/10/11 (pre op)  Intake/Output from previous day: 03/08 0701 - 03/09 0700 In: 4745.1 [I.V.:3595.1; NG/GT:60; IV Piggyback:450; TPN:640] Out: 1820 [Urine:1720; Blood:100] Intake/Output this shift: Total I/O In: -  Out: 100 [Urine:100]  General appearance: alert, cachectic, fatigued and no distress Resp: shallow resps, decreased sounds at bases, fair air movement Cardio: distant, regular, no mrg GI: slightly distended, tender Extremities: extremities normal, atraumatic, no cyanosis or edema  Lab Results:  Basename 07/11/11 0425 07/10/11 0510  WBC 7.8 9.9  HGB 8.7* 9.1*  HCT 26.6* 28.3*  PLT 298 352   BMET  Basename 07/11/11 0425 07/10/11 1730  NA 137 139  K 3.7 4.0  CL 108 109  CO2 21 23  GLUCOSE 248* 169*  BUN 19 12  CREATININE 1.78* 1.11  CALCIUM 7.4* 7.5*    Studies/Results: Dg Chest Port 1 View  07/11/2011  *RADIOLOGY REPORT*  Clinical Data: Check endotracheal tube.  PORTABLE CHEST - 1 VIEW  Comparison: 07/10/2011  Findings: Endotracheal tube is 2.9 cm above the carina.  PICC line tip in the SVC region.  Nasogastric tube extends into the abdomen. Stable hazy densities throughout the right lung and right lung base.  Probable small pleural effusions.  Heart size is stable. There may be slightly decreased edema in the left lung.  IMPRESSION: Minimal change in the appearance of the lungs.  Hazy densities in the right lung may represent a combination of edema and pleural fluid.  There may be  slightly decreased edema in the left lung.  Support apparatuses as described.  Original Report Authenticated By: Richarda Overlie, M.D.   Dg Chest Port 1 View  07/10/2011  *RADIOLOGY REPORT*  Clinical Data: Tachypnea.  Status post intubation.  Question aspiration.  PORTABLE CHEST - 1 VIEW  Comparison: Portable chest 06/24/2011.  Findings: The the patient is intubated.  The endotracheal tube terminates 2.6 cm above the carina.  Mild interstitial edema is now present.  There are bilateral pleural effusions.  A right-sided PICC line terminates at the cavoatrial junction.  An NG tube courses off the inferior border of the film.  Biapical pleural parenchymal scarring is again noted.  Bibasilar airspace disease is slightly more prominent on the right.  IMPRESSION:  1.  Endotracheal tube terminates 2.6 cm above the carina, satisfactory position. 2.  Interval development of edema and bilateral pleural effusions, suggesting congestive heart failure. 3.  Satisfactory positioning of a right-sided PICC line. 4.  Right greater than left bibasilar airspace disease.  While this may represent atelectasis, early infection or even aspiration is not excluded.  Original Report Authenticated By: Jamesetta Orleans. MATTERN, M.D.    Medications:  I have reviewed the patient's current medications. Scheduled:   . antiseptic oral rinse  15 mL Mouth Rinse q12n4p  . chlorhexidine  15 mL Mouth Rinse BID  . insulin aspart  0-9 Units Subcutaneous Q4H  . lactated ringers  1,000 mL Intravenous Once  . lip balm  1 application Topical BID  . pantoprazole (PROTONIX)  IV  40 mg Intravenous Q24H  . piperacillin-tazobactam (ZOSYN)  IV  3.375 g Intravenous Q8H  . potassium chloride  10 mEq Intravenous Q1 Hr x 5  . potassium chloride  10 mEq Intravenous Q1 Hr x 6  . sodium chloride  10-40 mL Intracatheter Q12H  . vancomycin  1,000 mg Intravenous Q24H  . DISCONTD: atorvastatin  10 mg Oral q1800  . DISCONTD: ertapenem  1 g Intravenous Q24H  .  DISCONTD: heparin subcutaneous  5,000 Units Subcutaneous Q8H  . DISCONTD: insulin aspart  0-9 Units Subcutaneous Q6H  . DISCONTD: vancomycin  500 mg Intravenous Q12H   Continuous:   . sodium chloride 75 mL/hr at 07/09/11 1900  . sodium chloride 75 mL/hr (07/10/11 2252)  . diltiazem (CARDIZEM) infusion 15 mg/hr (07/11/11 1150)  . TPN (CLINIMIX) +/- additives 60 mL/hr at 07/10/11 2200   And  . fat emulsion 250 mL (07/10/11 2200)  . heparin 1,100 Units/hr (07/10/11 1946)  . TPN [facility]) +/- additives Stopped (07/10/11 0908)  . TPN (CLINIMIX) +/- additives    . DISCONTD: sodium chloride    . DISCONTD: diltiazem (CARDIZEM) infusion Stopped (07/10/11 0908)   ZOX:WRUEAVWUJWJXB (TYLENOL) oral liquid 160 mg/5 mL, fentaNYL, metoCLOPramide (REGLAN) injection, midazolam, sodium chloride, DISCONTD: 0.9 % irrigation (POUR BTL), DISCONTD: acetaminophen, DISCONTD: bisacodyl, DISCONTD: bupivacaine-EPINEPHrine, DISCONTD: fentaNYL, DISCONTD: HYDROcodone-acetaminophen, DISCONTD: lactated ringers, DISCONTD: lactated ringers, DISCONTD: magic mouthwash, DISCONTD: menthol-cetylpyridinium, DISCONTD: methocarbamol(ROBAXIN) IV DISCONTD:  morphine injection, DISCONTD: ondansetron (ZOFRAN) IV, DISCONTD: phenol, DISCONTD: promethazine, DISCONTD: sterile water for irrigation  Assessment/Plan: 1) respiratory failure post op: seems to be doing well off vent so far. Imaging shows bilateral edema and possible infiltrates, atx vs infection vs aspiration.  Is on vanc and zosyn.  Recheck cxr in am. 2) SBO, fecal impaction, gi bleed: laparoscopy yesterday for removal of adhesions/impaction. abd still distended and tender. No bm yet. Hgb is fairly stable at 8.7. No further bleeding seen. 3) right hip fracture: per surgery 4) malnutrition: continues on TNA 5) acute on chronic kidney disease: cr elevated today. Continue to monitor. Continue TNa. Consider restarting IVF if continues. 6) chronic anticogaulation: had been held due  to gi bleed. Now on heparin gtt. 7) DM: cbg's have been elevated. Continue with sii.   LOS: 17 days   Oyuki Hogan 07/11/2011, 12:19 PM

## 2011-07-11 NOTE — Progress Notes (Signed)
ANTICOAGULATION CONSULT NOTE - Follow Up Consult  Pharmacy Consult for Heparin Indication: Hx PE  No Known Allergies  Patient Measurements: Height: 5\' 6"  (167.6 cm) Weight: 145 lb 8.1 oz (66 kg) IBW/kg (Calculated) : 63.8  Heparin Dosing Weight: 66kg  Vital Signs: Temp: 100.4 F (38 C) (03/09 0800) Temp src: Oral (03/09 0800) BP: 135/55 mmHg (03/09 1300) Pulse Rate: 132  (03/09 1300)  Labs:  Basename 07/11/11 1250 07/11/11 0425 07/11/11 0420 07/10/11 1730 07/10/11 0510 07/09/11 0340  HGB -- 8.7* -- -- 9.1* --  HCT -- 26.6* -- -- 28.3* 29.3*  PLT -- 298 -- -- 352 352  APTT -- -- -- -- -- --  LABPROT -- 17.4* -- -- 15.4* 15.4*  INR -- 1.40 -- -- 1.19 1.19  HEPARINUNFRC 0.45 -- 0.65 -- -- --  CREATININE -- 1.78* -- 1.11 0.97 --  CKTOTAL -- -- -- -- -- --  CKMB -- -- -- -- -- --  TROPONINI -- -- -- -- -- --   Estimated Creatinine Clearance: 32.4 ml/min (by C-G formula based on Cr of 1.78).   Medications:  Scheduled:    . antiseptic oral rinse  15 mL Mouth Rinse q12n4p  . chlorhexidine  15 mL Mouth Rinse BID  . insulin aspart  0-9 Units Subcutaneous Q4H  . lactated ringers  1,000 mL Intravenous Once  . lip balm  1 application Topical BID  . pantoprazole (PROTONIX) IV  40 mg Intravenous Q24H  . piperacillin-tazobactam (ZOSYN)  IV  3.375 g Intravenous Q8H  . potassium chloride  10 mEq Intravenous Q1 Hr x 5  . sodium chloride  10-40 mL Intracatheter Q12H  . vancomycin  1,000 mg Intravenous Q24H  . DISCONTD: atorvastatin  10 mg Oral q1800  . DISCONTD: ertapenem  1 g Intravenous Q24H  . DISCONTD: heparin subcutaneous  5,000 Units Subcutaneous Q8H  . DISCONTD: insulin aspart  0-9 Units Subcutaneous Q6H  . DISCONTD: vancomycin  500 mg Intravenous Q12H    Assessment:  [name] on coumadin pta for hx PE.  Anticoag has been on hold since 2/28 for GI bleed thought to be due to diverticular disease.    SQ heparin initiated when INR normalized, stopped 3/8 for surgery.  IV  Heparin started 3/8 after surgery  Heparin level therapeutic x2  Hg slightly low, no bleeding reported  Goal of Therapy:  Heparin level 0.3-0.7 units/ml   Plan:   Continue heparin 1100 units/hr  Check daily heparin level & CBC  F/U if plans to resume coumadin  Loralee Pacas, PharmD, BCPS Pager: [phone] 07/11/2011,1:24 PM

## 2011-07-12 ENCOUNTER — Inpatient Hospital Stay (HOSPITAL_COMMUNITY): Payer: Medicare HMO

## 2011-07-12 ENCOUNTER — Encounter (HOSPITAL_COMMUNITY): Payer: Self-pay | Admitting: Anesthesiology

## 2011-07-12 LAB — GLUCOSE, CAPILLARY
Glucose-Capillary: 236 mg/dL — ABNORMAL HIGH (ref 70–99)
Glucose-Capillary: 202 mg/dL — ABNORMAL HIGH (ref 70–99)
Glucose-Capillary: 190 mg/dL — ABNORMAL HIGH (ref 70–99)
Glucose-Capillary: 213 mg/dL — ABNORMAL HIGH (ref 70–99)

## 2011-07-12 LAB — CLOSTRIDIUM DIFFICILE BY PCR: Toxigenic C. Difficile by PCR: POSITIVE — AB

## 2011-07-12 LAB — CARDIAC PANEL(CRET KIN+CKTOT+MB+TROPI)
CK, MB: 1.9 ng/mL (ref 0.3–4.0)
Troponin I: <0.3 ng/mL (ref <0.30)

## 2011-07-12 LAB — BASIC METABOLIC PANEL
BUN: 18 mg/dL (ref 6–23)
BUN: 16 mg/dL (ref 6–23)
Chloride: 109 mEq/L (ref 96–112)
Chloride: 109 mEq/L (ref 96–112)
Creatinine, Ser: 1.44 mg/dL — ABNORMAL HIGH (ref 0.50–1.35)
GFR calc Af Amer: 61 mL/min — ABNORMAL LOW (ref >90)
GFR calc non Af Amer: 46 mL/min — ABNORMAL LOW (ref >90)
Glucose, Bld: 205 mg/dL — ABNORMAL HIGH (ref 70–99)
Glucose, Bld: 195 mg/dL — ABNORMAL HIGH (ref 70–99)
Potassium: 2.9 mEq/L — ABNORMAL LOW (ref 3.5–5.1)
Potassium: 2.7 mEq/L — CL (ref 3.5–5.1)
Sodium: 138 mEq/L (ref 135–145)

## 2011-07-12 LAB — CBC
HCT: 25.9 % — ABNORMAL LOW (ref 39.0–52.0)
Hemoglobin: 8.7 g/dL — ABNORMAL LOW (ref 13.0–17.0)
MCHC: 33.6 g/dL (ref 30.0–36.0)
MCV: 84.4 fL (ref 78.0–100.0)

## 2011-07-12 LAB — HEPARIN LEVEL (UNFRACTIONATED): Heparin Unfractionated: 0.3 IU/mL (ref 0.30–0.70)

## 2011-07-12 LAB — PROTIME-INR: INR: 1.23 (ref 0.00–1.49)

## 2011-07-12 MED ORDER — MAGNESIUM SULFATE IN D5W 10-5 MG/ML-% IV SOLN
1.0000 g | Freq: Once | INTRAVENOUS | Status: AC
  Administered 2011-07-12: 1 g via INTRAVENOUS
  Filled 2011-07-12: qty 100

## 2011-07-12 MED ORDER — INSULIN REGULAR HUMAN 100 UNIT/ML IJ SOLN
INTRAMUSCULAR | Status: DC
  Filled 2011-07-12: qty 1000

## 2011-07-12 MED ORDER — INSULIN REGULAR HUMAN 100 UNIT/ML IJ SOLN
INTRAVENOUS | Status: AC
  Administered 2011-07-13: 06:00:00 via INTRAVENOUS
  Filled 2011-07-12: qty 1000

## 2011-07-12 MED ORDER — POTASSIUM CHLORIDE 10 MEQ/100ML IV SOLN
10.0000 meq | INTRAVENOUS | Status: AC
  Administered 2011-07-12 – 2011-07-13 (×6): 10 meq via INTRAVENOUS
  Filled 2011-07-12: qty 100
  Filled 2011-07-12: qty 200
  Filled 2011-07-12: qty 100
  Filled 2011-07-12: qty 200

## 2011-07-12 MED ORDER — VANCOMYCIN HCL 500 MG IV SOLR
500.0000 mg | Freq: Four times a day (QID) | Status: AC
  Administered 2011-07-12 (×2): 500 mg via RECTAL
  Filled 2011-07-12 (×2): qty 500

## 2011-07-12 MED ORDER — MAGNESIUM SULFATE 50 % IJ SOLN: 1.0000 g | Freq: Once | INTRAVENOUS | Status: DC

## 2011-07-12 MED ORDER — METRONIDAZOLE IN NACL 5-0.79 MG/ML-% IV SOLN
500.0000 mg | Freq: Four times a day (QID) | INTRAVENOUS | Status: DC
  Administered 2011-07-12 – 2011-07-23 (×43): 500 mg via INTRAVENOUS
  Filled 2011-07-12 (×46): qty 100

## 2011-07-12 MED ORDER — ASPIRIN 300 MG RE SUPP
300.0000 mg | Freq: Once | RECTAL | Status: AC
  Administered 2011-07-12: 300 mg via RECTAL
  Filled 2011-07-12: qty 1

## 2011-07-12 MED ORDER — INSULIN REGULAR HUMAN 100 UNIT/ML IJ SOLN
INTRAVENOUS | Status: AC
  Administered 2011-07-12: 18:00:00 via INTRAVENOUS
  Filled 2011-07-12: qty 1000

## 2011-07-12 MED ORDER — FENTANYL CITRATE 0.05 MG/ML IJ SOLN
12.5000 ug | INTRAMUSCULAR | Status: DC | PRN
  Administered 2011-07-12 – 2011-07-21 (×43): 25 ug via INTRAVENOUS
  Administered 2011-07-21: 12:00:00 via INTRAVENOUS
  Filled 2011-07-12 (×37): qty 2

## 2011-07-12 MED ORDER — POTASSIUM CHLORIDE 10 MEQ/100ML IV SOLN
10.0000 meq | INTRAVENOUS | Status: AC
  Administered 2011-07-12 (×4): 10 meq via INTRAVENOUS
  Filled 2011-07-12 (×4): qty 100

## 2011-07-12 MED ORDER — INSULIN REGULAR HUMAN 100 UNIT/ML IJ SOLN
INTRAVENOUS | Status: DC
  Filled 2011-07-12: qty 1000

## 2011-07-12 MED ORDER — INSULIN REGULAR HUMAN 100 UNIT/ML IJ SOLN
INTRAVENOUS | Status: DC
  Filled 2011-07-12: qty 2000

## 2011-07-12 NOTE — Progress Notes (Addendum)
CRITICAL VALUE ALERT  Critical value received:  cdiff positive  Date of notification:  07-12-11  Time of notification:  1425  Critical value read back:  Yes  Nurse who received alert:  Roney Jaffe, RN  MD notified (1st page):  [facility] surgery  Time of first page:  1430  MD notified (2nd page):   Time of second page:  Responding MD:  Dr.  Luretha Murphy  Time MD responded:  1440

## 2011-07-12 NOTE — Progress Notes (Signed)
PARENTERAL NUTRITION CONSULT NOTE - FOLLOW UP  Pharmacy Consult for TNA Indication: SBO  No Known Allergies  Patient Measurements: Height: 5\' 6"  (167.6 cm) Weight: 153 lb 7 oz (69.6 kg) IBW/kg (Calculated) : 63.8  Usual Weight: unable to assess  Vital Signs: Temp: 100.8 F (38.2 C) (03/10 0400) Temp src: Oral (03/10 0400) BP: 114/56 mmHg (03/10 0600) Pulse Rate: 126  (03/10 0600) Intake/Output from previous day: 03/09 0701 - 03/10 0700 In: 5237 [I.V.:2532; Blood:262.5; NG/GT:350; IV Piggyback:652.5; TPN:1440] Out: 1718 [Urine:1715; Stool:3] I/O +3690 (3/9)    Labs:  [facility] 07/12/11 0520 07/11/11 1604 07/11/11 0425 07/10/11 0510  WBC 9.9 7.8 7.8 --  HGB 8.7* 7.3* 8.7* --  HCT 25.9* 21.9* 26.6* --  PLT 226 235 298 --  APTT -- -- -- --  INR 1.23 -- 1.40 1.19     Basename 07/12/11 0520 07/11/11 0425 07/10/11 1730 07/10/11 0510  NA 138 137 139 --  K 2.9* 3.7 4.0 --  CL 109 108 109 --  CO2 22 21 23  --  GLUCOSE 205* 248* 169* --  BUN 18 19 12  --  CREATININE 1.44* 1.78* 1.11 --  LABCREA -- -- -- --  CREAT24HRUR -- -- -- --  CALCIUM 7.3* 7.4* 7.5* --  MG -- 1.8 -- 1.3*  PHOS -- 2.7 -- 2.3  PROT -- -- -- 5.5*  ALBUMIN -- -- -- 1.9*  AST -- -- -- 17  ALT -- -- -- 15  ALKPHOS -- -- -- 89  BILITOT -- -- -- 0.7  BILIDIR -- -- -- --  IBILI -- -- -- --  PREALBUMIN -- -- -- 5.4*  TRIG -- -- -- 115  CHOLHDL -- -- -- --  CHOL -- -- -- 76  Corrected calcium = 8.98 with alb = 1.9 Estimated Creatinine Clearance: 40 ml/min (by C-G formula based on Cr of 1.44).   CBGs: 255, 209, 199, 224, 236 ( 25 units SSI required/24hr)  Medications:  Scheduled:     . antiseptic oral rinse  15 mL Mouth Rinse q12n4p  . chlorhexidine  15 mL Mouth Rinse BID  . insulin aspart  0-9 Units Subcutaneous Q4H  . lactated ringers  1,000 mL Intravenous Once  . lip balm  1 application Topical BID  . metoprolol  5 mg Intravenous Once  . metoprolol  5 mg Intravenous Q6H  . pantoprazole  (PROTONIX) IV  40 mg Intravenous Q24H  . piperacillin-tazobactam (ZOSYN)  IV  3.375 g Intravenous Q8H  . potassium chloride  10 mEq Intravenous Q1 Hr x 4  . sodium chloride  500 mL Intravenous Once  . sodium chloride  10-40 mL Intracatheter Q12H  . vancomycin  1,000 mg Intravenous Q24H  . DISCONTD: insulin aspart  0-9 Units Subcutaneous Q6H  . DISCONTD: vancomycin  500 mg Intravenous Q12H   Infusions:  NS at 75 ml/hr Heparin 1100 units/hr   Current Nutrition:  Clinimix E5/15 at 13ml/hr  NPO  Assessment:  77 YOM s/p ORIF 2/22, GI bleed 2/28 thought to be 2/2 diverticular disease, CT 3/4 consistent with SBO. NG tube with significant output, plan to start TNA. Last meal recorded 2/28.  POD#2 LOA, serosal/enterotomy repair, fecal dsimpaction  K+ low  Baseline prealbumin 5.4  CBGs >200, will require addition of insulin to TNA  Scr elevated post op, will not advance TNA rate so as not to overshoot protein at this point - monitor.  Nutritional Goals:   1850-2110 kCal, 79-92 grams of protein per day  Clinimix E5/20 36ml/hr + 20% Lipids 71ml/hr will provide 90gm protein, 1584 kcal on non-lipid days, 2064 kcal on lipid days, average 1790 kcal/week  Plan:   Continue Clinimix to E5/20 at 79ml/hr Fat emulsion 10 ml/hr(MWF only due to ongoing shortage). TNA to contain standard multivitamins and trace elements (MWF only due to ongoing shortage). KCl x 4 runs per MD IVF per MD Add 20 units insulin per 2L bag of TNA (~14 units/day) Continue CBG/SSI to q4h TNA lab panels on Mondays & Thursdays.   Loralee Pacas, PharmD, BCPS Pager: [phone] 07/12/2011,7:38 AM

## 2011-07-12 NOTE — Progress Notes (Signed)
eLink Physician-Brief Progress Note Patient Name: [name] DOB: 1933-12-23 MRN: [id number]  Date of Service  07/12/2011   HPI/Events of Note   RN says patient having heartburn each time q6h lopressor given. HR change no greater than 20 beats per RN    No results found for this basename: TROPONINI:5 in the last 168 hours   eICU Interventions  Continue lopressopr Monitor situation Rule out MI   Intervention Category Intermediate Interventions: Other:  Mirage Pfefferkorn 07/12/2011, 8:47 PM

## 2011-07-12 NOTE — Progress Notes (Signed)
CRITICAL VALUE ALERT  Critical value received:  K+ 2.7  Date of notification:  07-12-11  Time of notification:  1737  Critical value read back:  yes  Nurse who received alert:  Roney Jaffe, RN/Stephanie Dillion RN  MD notified (1st page):  Dr. Marchelle Gearing, Pola Corn  Time of first page:    MD notified (2nd page):   Time of second page:  Responding MD:  Dr. Marchelle Gearing  Time MD responded:  [phone]

## 2011-07-12 NOTE — Progress Notes (Signed)
Name: [name] MRN: [id number] DOB: Jan 09, 1934    LOS: 18  [facility]  History of Present Illness:  78 YO male w/ history of PE, and renal insuff.,  who was admitted on 2/20 s/p a fall resulting in right hip fracture which was repaired by Ortho on 2/22. Post-op he developed GIB and bowel obstruction. He was followed conservatively, but ultimately went for Exploratory lap on 3/8, he underwent lysis of adhesion in the OR. Towards the end-of the surgical case anesthesia reported some difficulty with oxygenation. In the PACU he was unable to be extubated due to rising FIO2 requirements and increased respiratory distress. PCCM was asked to evaluate.   Lines / Drains: OETT 3/8>>> 3/9 PICC line 3/7>>>  Cultures: Sputum 3/8>>> procalcitonin 3/8>>>  5.31 >> 18.64  Antibiotics: invanz 3/8 (abd coverage)>>>3/8 Zosyn 3/8 (HCAP)>>> vanc 3/8 (HCAP)>>>  Tests / Events: 2/22 right hip ORIF 3/8 expl lap w/ lysis of adhesion 3/8 LE dopplers>>> B LE DVT that appear to be chronic  Subjective:  L hemineglect noted by nurse yesterday midday persisted and actually worsened through the day, CT head without evidence acute CVA, heparin was running and was continued. He is in no distress this am, denies dyspnea but is clearly weaker and unable to cough effectively  Vital Signs: Temp:  [97.8 F (36.6 C)-101.1 F (38.4 C)] 97.8 F (36.6 C) (03/10 0800) Pulse Rate:  [112-138] 125  (03/10 1030) Resp:  [22-28] 23  (03/10 1030) BP: (102-139)/(50-65) 114/61 mmHg (03/10 1030) SpO2:  [96 %-100 %] 100 % (03/10 1030) Weight:  [69.6 kg (153 lb 7 oz)] 69.6 kg (153 lb 7 oz) (03/10 0300) CVP:  [8 mmHg] 8 mmHg    . sodium chloride 75 mL/hr at 07/11/11 1742  . TPN (CLINIMIX) +/- additives 60 mL/hr at 07/10/11 2200   And  . fat emulsion 250 mL (07/10/11 2200)  . heparin 1,200 Units/hr (07/12/11 1000)  . TPN (CLINIMIX) +/- additives 60 mL/hr at 07/11/11 1745  . TPN (CLINIMIX) +/-  additives    . DISCONTD: diltiazem (CARDIZEM) infusion Stopped (07/11/11 1546)   I/O last 3 completed shifts: In: 6978.1 [I.V.:3673.1; Blood:262.5; NG/GT:60; IV Piggyback:852.5] Out: 2558 [Urine:1855; Emesis/NG output:700; Stool:3]  Physical Examination: General:  Chronically ill appearing AAM  Neuro:  Awake, follows commands, very weak globally but now unable to move LUE or LLE, L neglect HEENT:  No JVD Cardiovascular:  Tachy rrr Lungs:  Diffuse rhonchi Abdomen:  abd tender, mid abd incision CD&I Musculoskeletal:  intact   Labs and Imaging:   Lab 07/12/11 0520 07/11/11 0425 07/10/11 1730  NA 138 137 139  K 2.9* 3.7 4.0  CL 109 108 109  CO2 22 21 23   BUN 18 19 12   CREATININE 1.44* 1.78* 1.11  GLUCOSE 205* 248* 169*    Lab 07/12/11 0520 07/11/11 1604 07/11/11 0425  HGB 8.7* 7.3* 8.7*  HCT 25.9* 21.9* 26.6*  WBC 9.9 7.8 7.8  PLT 226 235 298    Assessment and Plan: SBO (small bowel obstruction) now s/p exp lap with lysis of adhesions 3/8 - CCS following - NGT in place   Acute respiratory failure with Post-op hypoxia: CXR showing new right sided infiltrate vs Atelectasis. Does have history of PE (pulmonary embolism diagnosed in July 2012). He has B chronic DVT on dopplers here, has been on anticoagulation up until bleeding noted on 2/27. Extubated 3/9 Plan: -scheduled BDs -sputum culture >> normal flora -HCAP coverage will consider d/c if CXR clears and  cx negative -check LE dopplers >> positive for chronic DVT -continue IV heparin for DVT and presumed CVA -PUSH pulm toilet, high risk reintubation due to CVA  New L hemiparesis and apparent CVA -MRI brain ordered, but ha can't get with staples in place, will have to defer -continue heparin gtt -add ASA PR -will need to discuss the CVA, goal of care with his wife, especially re reintubation if he cannot protect airway  Fracture of hip, right, closed 2/22 Plan: -post-op care per ortho  A-fib. With tachycardia o/n  3/8, suspect due to demand of acute illness, dilt gtt started 3/8 Plan: -dilt weaned to off - metoprolol prn  And scheduled q6h - heparin  Hypokalemia  Lab 07/12/11 0520 07/11/11 0425 07/10/11 1730  K 2.9* 3.7 4.0  plan: -follow and replace.  Anemia, on heparin - 1 u PRBC this am - follow CBC  H/o renal insuff Recent Labs  Basename 07/12/11 0520 07/11/11 0425 07/10/11 1730   CREATININE 1.44* 1.78* 1.11  plan: -follow UOP and S Cr  Best practices / Disposition: -->ICU status, will change to PCCM service if he remains intubated -->full code -->IV Heparin gtt -->Protonix for GI Px --> discussed status with the pt's wife by phone. Informed her about apparent CVA, about the risks it poses for reintubation. I will also discuss goal of care with the patient   Levy Pupa, MD, PhD 07/12/2011, 11:04 AM [facility] [phone] or if no answer [phone]

## 2011-07-12 NOTE — Progress Notes (Signed)
eLink Physician-Brief Progress Note Patient Name: Psalm Schappell DOB: 1933/12/31 MRN: [id number]  Date of Service  07/12/2011   HPI/Events of Note   Clostridium difficile: PCR positive   eICU Interventions  Start IV flagyl (cannot take po per RN)   Intervention Category Intermediate Interventions: Diagnostic test evaluation  Sabrea Sankey 07/12/2011, 4:44 PM

## 2011-07-12 NOTE — Progress Notes (Signed)
ANTICOAGULATION CONSULT NOTE - Follow Up Consult  Pharmacy Consult for Heparin Indication: Hx PE, New CVA?  No Known Allergies  Patient Measurements: Height: 5\' 6"  (167.6 cm) Weight: 153 lb 7 oz (69.6 kg) IBW/kg (Calculated) : 63.8   Vital Signs: Temp: 100.8 F (38.2 C) (03/10 0400) Temp src: Oral (03/10 0400) BP: 114/56 mmHg (03/10 0600) Pulse Rate: 126  (03/10 0600)  Labs:  Basename 07/12/11 0520 07/11/11 1604 07/11/11 1250 07/11/11 0425 07/11/11 0420 07/10/11 1730 07/10/11 0510  HGB 8.7* 7.3* -- -- -- -- --  HCT 25.9* 21.9* -- 26.6* -- -- --  PLT 226 235 -- 298 -- -- --  APTT -- -- -- -- -- -- --  LABPROT 15.8* -- -- 17.4* -- -- 15.4*  INR 1.23 -- -- 1.40 -- -- 1.19  HEPARINUNFRC 0.27* -- 0.45 -- 0.65 -- --  CREATININE 1.44* -- -- 1.78* -- 1.11 --  CKTOTAL -- -- -- -- -- -- --  CKMB -- -- -- -- -- -- --  TROPONINI -- -- -- -- -- -- --   Estimated Creatinine Clearance: 40 ml/min (by C-G formula based on Cr of 1.44).   Medications:  Scheduled:    . antiseptic oral rinse  15 mL Mouth Rinse q12n4p  . chlorhexidine  15 mL Mouth Rinse BID  . insulin aspart  0-9 Units Subcutaneous Q4H  . lactated ringers  1,000 mL Intravenous Once  . lip balm  1 application Topical BID  . metoprolol  5 mg Intravenous Once  . metoprolol  5 mg Intravenous Q6H  . pantoprazole (PROTONIX) IV  40 mg Intravenous Q24H  . piperacillin-tazobactam (ZOSYN)  IV  3.375 g Intravenous Q8H  . potassium chloride  10 mEq Intravenous Q1 Hr x 4  . sodium chloride  500 mL Intravenous Once  . sodium chloride  10-40 mL Intracatheter Q12H  . vancomycin  1,000 mg Intravenous Q24H  . DISCONTD: insulin aspart  0-9 Units Subcutaneous Q6H  . DISCONTD: vancomycin  500 mg Intravenous Q12H   Infusions:    . sodium chloride 75 mL/hr at 07/11/11 1742  . TPN (CLINIMIX) +/- additives 60 mL/hr at 07/10/11 2200   And  . fat emulsion 250 mL (07/10/11 2200)  . heparin 1,100 Units/hr (07/12/11 0600)  . TPN  (CLINIMIX) +/- additives 60 mL/hr at 07/11/11 1745  . DISCONTD: diltiazem (CARDIZEM) infusion Stopped (07/11/11 1546)    Assessment:  77 YOM on coumadin pta for hx PE. Anticoag has been on hold since 2/28 for GI bleed thought to be due to diverticular disease.  Heparin started 3/8 after surgery  Level slightly low today 0.27  Hg improved s/p transfusion last pm  No visible bleeding reported per RN or notes  Events of last pm noted (hemiplegia, negative head CT)  Goal of Therapy:  Heparin level 0.3-0.7 units/ml   Plan:   Increase heparin 1200 units/hr  Recheck level in 8hrs  F/U results of MRI for r/o CVA  Loralee Pacas, PharmD, BCPS Pager: [phone] 07/12/2011,7:24 AM

## 2011-07-12 NOTE — Progress Notes (Signed)
Subjective: New left sided hemiplegia noted by nursing at about 8pm last night.  CT negative. Has persisted.  Patient reports no complaints. Is oriented. Is aware of left sided weakness.  Is not in distress.  Objective: Vital signs in last 24 hours: Temp:  [97.9 F (36.6 C)-101.1 F (38.4 C)] 100.8 F (38.2 C) (03/10 0400) Pulse Rate:  [112-138] 126  (03/10 0600) Resp:  [22-26] 25  (03/10 0600) BP: (102-139)/(50-65) 114/56 mmHg (03/10 0600) SpO2:  [96 %-99 %] 97 % (03/10 0600) FiO2 (%):  [30 %] 30 % (03/09 0900) Weight:  [69.6 kg (153 lb 7 oz)] 69.6 kg (153 lb 7 oz) (03/10 0300) Weight change:  Last BM Date: 07/10/11 (pre op)  Intake/Output from previous day: 03/09 0701 - 03/10 0700 In: 5237 [I.V.:2532; Blood:262.5; NG/GT:350; IV Piggyback:652.5; TPN:1440] Out: 1718 [Urine:1715; Stool:3] Intake/Output this shift: Total I/O In: 1658.5 [I.V.:421; Blood:262.5; NG/GT:350; IV Piggyback:25; TPN:600] Out: 1018 [Urine:1015; Stool:3]  General appearance: cooperative, fatigued and no distress Head: Normocephalic, without obvious abnormality, atraumatic, mucus membranes are dry Resp: anterior exam, shallow resps, diffuse rhonchi Cardio: regular rate and rhythm, S1, S2 normal, no murmur, click, rub or gallop GI: bs decreased, abd still tender, slightly distended Extremities: +1 edema bilateral LE's, +1 edema LUE Neurologic: Cranial nerves: II: pupils equal, round, reactive to light and accommodation, VII: upper facial muscle function left decreased, VII: lower facial muscle function left left, XI: neck flexion strength reduced Motor: dense left sided hemiplegia upper and lower extremities  Lab Results:  Basename 07/12/11 0520 07/11/11 1604  WBC 9.9 7.8  HGB 8.7* 7.3*  HCT 25.9* 21.9*  PLT 226 235   BMET  Basename 07/12/11 0520 07/11/11 0425  NA 138 137  K PENDING 3.7  CL 109 108  CO2 22 21  GLUCOSE 205* 248*  BUN 18 19  CREATININE 1.44* 1.78*  CALCIUM 7.3* 7.4*     Studies/Results: Ct Head Wo Contrast  07/11/2011  *RADIOLOGY REPORT*  Clinical Data: Left hemiplegia.  CT HEAD WITHOUT CONTRAST  Technique:  Contiguous axial images were obtained from the base of the skull through the vertex without contrast.  Comparison: None.  Findings: Mildly enlarged ventricles and subarachnoid spaces. White matter low density in the right frontal lobe.  Minimal white matter low density in the left frontal lobe.  No intracranial hemorrhage, mass lesion or CT evidence of acute infarction.  Small, opacified right maxillary sinus.  Left maxillary sinus mucosal thickening with a maximum thickness of 9 mm.  IMPRESSION:  1.  No acute CT abnormality. 2.  Mild atrophy. 3.  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 4.  Hypoplastic, opacified right maxillary sinus and chronic left maxillary sinusitis.  These results were called by telephone on 07/11/2011  at  2100 hours to  Tvedte, the patient's nurse, who verbally acknowledged these results.  Original Report Authenticated By: Darrol Angel, M.D.   Dg Chest Port 1 View  07/11/2011  *RADIOLOGY REPORT*  Clinical Data: Check endotracheal tube.  PORTABLE CHEST - 1 VIEW  Comparison: 07/10/2011  Findings: Endotracheal tube is 2.9 cm above the carina.  PICC line tip in the SVC region.  Nasogastric tube extends into the abdomen. Stable hazy densities throughout the right lung and right lung base.  Probable small pleural effusions.  Heart size is stable. There may be slightly decreased edema in the left lung.  IMPRESSION: Minimal change in the appearance of  the lungs.  Hazy densities in the right lung may represent a combination of edema and pleural fluid.  There may be slightly decreased edema in the left lung.  Support apparatuses as described.  Original Report Authenticated By: Richarda Overlie, M.D.   Dg Chest Port 1 View  07/10/2011  *RADIOLOGY REPORT*  Clinical Data:  Tachypnea.  Status post intubation.  Question aspiration.  PORTABLE CHEST - 1 VIEW  Comparison: Portable chest 06/24/2011.  Findings: The the patient is intubated.  The endotracheal tube terminates 2.6 cm above the carina.  Mild interstitial edema is now present.  There are bilateral pleural effusions.  A right-sided PICC line terminates at the cavoatrial junction.  An NG tube courses off the inferior border of the film.  Biapical pleural parenchymal scarring is again noted.  Bibasilar airspace disease is slightly more prominent on the right.  IMPRESSION:  1.  Endotracheal tube terminates 2.6 cm above the carina, satisfactory position. 2.  Interval development of edema and bilateral pleural effusions, suggesting congestive heart failure. 3.  Satisfactory positioning of a right-sided PICC line. 4.  Right greater than left bibasilar airspace disease.  While this may represent atelectasis, early infection or even aspiration is not excluded.  Original Report Authenticated By: Jamesetta Orleans. MATTERN, M.D.    Medications:  I have reviewed the patient's current medications. Scheduled:   . antiseptic oral rinse  15 mL Mouth Rinse q12n4p  . chlorhexidine  15 mL Mouth Rinse BID  . insulin aspart  0-9 Units Subcutaneous Q4H  . lactated ringers  1,000 mL Intravenous Once  . lip balm  1 application Topical BID  . metoprolol  5 mg Intravenous Once  . metoprolol  5 mg Intravenous Q6H  . pantoprazole (PROTONIX) IV  40 mg Intravenous Q24H  . piperacillin-tazobactam (ZOSYN)  IV  3.375 g Intravenous Q8H  . sodium chloride  500 mL Intravenous Once  . sodium chloride  10-40 mL Intracatheter Q12H  . vancomycin  1,000 mg Intravenous Q24H  . DISCONTD: insulin aspart  0-9 Units Subcutaneous Q6H  . DISCONTD: vancomycin  500 mg Intravenous Q12H   Continuous:   . sodium chloride 75 mL/hr at 07/11/11 1742  . TPN (CLINIMIX) +/- additives 60 mL/hr at 07/10/11 2200   And  . fat emulsion 250 mL (07/10/11 2200)  . heparin  1,100 Units/hr (07/12/11 0600)  . TPN (CLINIMIX) +/- additives 60 mL/hr at 07/11/11 1745  . DISCONTD: diltiazem (CARDIZEM) infusion Stopped (07/11/11 1546)   NFA:OZHYQMVHQIONG (TYLENOL) oral liquid 160 mg/5 mL, metoCLOPramide (REGLAN) injection, sodium chloride, DISCONTD: fentaNYL, DISCONTD: midazolam  Assessment/Plan:  1) left sided hemiplegia: CT negative. Send for MRI today. High suspicion for stroke. 2) Fever: no leucocytosis. cxr pending, MRI pending, has received prbc's overnight, on broad spectrum abx. 3) Hypokalemia; repleat and check mg 4) respiratory failure post op: doing well off vent. cxr pending  5) SBO, fecal impaction, gi bleed: s/p laparoscopy for removal of adhesions/impaction. BM x 3 on 3/9, question diarrhea c. Dif ordered  6) anemia: possibly due to gi bleed as well as multifactoral. Received 1 units prbc's overnight. hgb adequate today. 7) right hip fracture: per surgery  8) malnutrition: continues on TNA  9) acute on chronic kidney disease: cr improved  10) chronic anticogaulation for history of PE: continue heparin gtt. CT negative for bleed 11) DM: cbg's have been elevated in 200's, still on TPN, discuss with nutrition increase insulin?    LOS: 18 days   Gabrielle Wakeland 07/12/2011, 6:31 AM

## 2011-07-12 NOTE — Progress Notes (Signed)
2 Days Post-Op  Subjective: Left sided hemiplegia noted yesterday, ct head negative. Persistent tachycardia yesterday. hgb dropped to mid 7. recvd 1u PRBC and responded appropriately. Low grade fever. Nurse reports total of around 650 from ng. Pt had 3 bm. C/o abd pain.   Objective: Vital signs in last 24 hours: Temp:  [97.8 F (36.6 C)-101.1 F (38.4 C)] 97.8 F (36.6 C) (03/10 0800) Pulse Rate:  [112-138] 130  (03/10 0700) Resp:  [22-26] 23  (03/10 0700) BP: (102-139)/(50-65) 110/62 mmHg (03/10 0700) SpO2:  [96 %-99 %] 99 % (03/10 0700) FiO2 (%):  [30 %] 30 % (03/09 0900) Weight:  [153 lb 7 oz (69.6 kg)] 153 lb 7 oz (69.6 kg) (03/10 0300) Last BM Date: 07/10/11 (pre op)  Intake/Output from previous day: 03/09 0701 - 03/10 0700 In: 4887 [I.V.:2532; Blood:262.5; IV Piggyback:652.5; TPN:1440] Out: 2418 [Urine:1715; Emesis/NG output:700; Stool:3] Intake/Output this shift:    Alert, nad Upper airway congestion. Lung fields clear anteriorly. decr bs at bases Tachy Distended, soft, incision c/d/i No edema Left foot drop, cant get pt to move lue or lle. Sensation intact  Lab Results:   Basename 07/12/11 0520 07/11/11 1604  WBC 9.9 7.8  HGB 8.7* 7.3*  HCT 25.9* 21.9*  PLT 226 235   BMET  Basename 07/12/11 0520 07/11/11 0425  NA 138 137  K 2.9* 3.7  CL 109 108  CO2 22 21  GLUCOSE 205* 248*  BUN 18 19  CREATININE 1.44* 1.78*  CALCIUM 7.3* 7.4*   PT/INR  Basename 07/12/11 0520 07/11/11 0425  LABPROT 15.8* 17.4*  INR 1.23 1.40   ABG  Basename 07/11/11 0400 07/10/11 1640  PHART 7.435 7.439  HCO3 17.3* 18.4*    Studies/Results: Ct Head Wo Contrast  07/11/2011  *RADIOLOGY REPORT*  Clinical Data: Left hemiplegia.  CT HEAD WITHOUT CONTRAST  Technique:  Contiguous axial images were obtained from the base of the skull through the vertex without contrast.  Comparison: None.  Findings: Mildly enlarged ventricles and subarachnoid spaces. White matter low density in the  right frontal lobe.  Minimal white matter low density in the left frontal lobe.  No intracranial hemorrhage, mass lesion or CT evidence of acute infarction.  Small, opacified right maxillary sinus.  Left maxillary sinus mucosal thickening with a maximum thickness of 9 mm.  IMPRESSION:  1.  No acute CT abnormality. 2.  Mild atrophy. 3.  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 4.  Hypoplastic, opacified right maxillary sinus and chronic left maxillary sinusitis.  These results were called by telephone on 07/11/2011  at  2100 hours to  Tvedte, the patient's nurse, who verbally acknowledged these results.  Original Report Authenticated By: Darrol Angel, M.D.   Dg Chest Port 1 View  07/11/2011  *RADIOLOGY REPORT*  Clinical Data: Check endotracheal tube.  PORTABLE CHEST - 1 VIEW  Comparison: 07/10/2011  Findings: Endotracheal tube is 2.9 cm above the carina.  PICC line tip in the SVC region.  Nasogastric tube extends into the abdomen. Stable hazy densities throughout the right lung and right lung base.  Probable small pleural effusions.  Heart size is stable. There may be slightly decreased edema in the left lung.  IMPRESSION: Minimal change in the appearance of the lungs.  Hazy densities in the right lung may represent a combination of edema and pleural fluid.  There may be slightly decreased edema in the left lung.  Support apparatuses as described.  Original Report Authenticated By: Richarda Overlie, M.D.   Dg Chest Port 1 View  07/10/2011  *RADIOLOGY REPORT*  Clinical Data: Tachypnea.  Status post intubation.  Question aspiration.  PORTABLE CHEST - 1 VIEW  Comparison: Portable chest 06/24/2011.  Findings: The the patient is intubated.  The endotracheal tube terminates 2.6 cm above the carina.  Mild interstitial edema is now present.  There are bilateral pleural effusions.  A right-sided PICC line terminates at the cavoatrial  junction.  An NG tube courses off the inferior border of the film.  Biapical pleural parenchymal scarring is again noted.  Bibasilar airspace disease is slightly more prominent on the right.  IMPRESSION:  1.  Endotracheal tube terminates 2.6 cm above the carina, satisfactory position. 2.  Interval development of edema and bilateral pleural effusions, suggesting congestive heart failure. 3.  Satisfactory positioning of a right-sided PICC line. 4.  Right greater than left bibasilar airspace disease.  While this may represent atelectasis, early infection or even aspiration is not excluded.  Original Report Authenticated By: Jamesetta Orleans. MATTERN, M.D.    Anti-infectives: Anti-infectives     Start     Dose/Rate Route Frequency Ordered Stop   07/11/11 2359   vancomycin (VANCOCIN) IVPB 1000 mg/200 mL premix        1,000 mg 200 mL/hr over 60 Minutes Intravenous Every 24 hours 07/11/11 0807     07/10/11 1800   vancomycin (VANCOCIN) 500 mg in sodium chloride 0.9 % 100 mL IVPB  Status:  Discontinued        500 mg 100 mL/hr over 60 Minutes Intravenous Every 12 hours 07/10/11 1641 07/11/11 0806   07/10/11 1800   piperacillin-tazobactam (ZOSYN) IVPB 3.375 g        3.375 g 12.5 mL/hr over 240 Minutes Intravenous Every 8 hours 07/10/11 1708     07/10/11 0800   ertapenem (INVANZ) 1 g in sodium chloride 0.9 % 50 mL IVPB  Status:  Discontinued        1 g 100 mL/hr over 30 Minutes Intravenous Every 24 hours 07/10/11 0739 07/10/11 1659   06/26/11 2000   ceFAZolin (ANCEF) IVPB 1 g/50 mL premix        1 g 100 mL/hr over 30 Minutes Intravenous Every 6 hours 06/26/11 1658 06/27/11 0857          Assessment/Plan: s/p Procedure(s) (LRB): LAPAROSCOPY DIAGNOSTIC (N/A) LYSIS OF ADHESION (N/A) IMPACTION REMOVAL (N/A) Repair serosal tear and enterotomy  pulm-needs aggressive pulm toilet cv- tachy, cont lopressor, mgmt per ccm Chronic PE - ok to cont hep gtt Anemia - responded appropriately to transfusion,  check cbc mon GI- cont ng tube to liws,  F/E/N - cont TPN, keep K>4, Mg>2. ID- does NOT need abx coverage for abdomen Renal - CKD, cr down today. uop ok. Cont foley for strict i/o Neuro- MRI pending to confirm suspicion of stroke  [name]. Andrey Campanile, MD, FACS General, Bariatric, & Minimally Invasive Surgery [facility], [place]      LOS: 18 days    [name] 07/12/2011

## 2011-07-12 NOTE — Progress Notes (Signed)
Noted pt with surgery since last seen on 3/6/ 2013 and apparent new CVA since surgery. OT is signing off, please re-order if you feel appropriate.  Ignacia Palma, OTR/L [phone] 07/12/2011

## 2011-07-12 NOTE — Progress Notes (Signed)
eLink Physician-Brief Progress Note Patient Name: [name] DOB: Sep 25, 1933 MRN: [id number]  Date of Service  07/12/2011   HPI/Events of Note   .  Lab 07/12/11 1655 07/12/11 0520 07/11/11 0425 07/10/11 1730 07/10/11 0510 07/09/11 0340  NA 138 138 137 139 140 --  K 2.7* 2.9* -- -- -- --  CL 109 109 108 109 109 --  CO2 21 22 21 23 24  --  GLUCOSE 195* 205* 248* 169* 152* --  BUN 16 18 19 12 9  --  CREATININE 1.29 1.44* 1.78* 1.11 0.97 --  CALCIUM 7.6* 7.3* 7.4* 7.5* 7.9* --  MG 1.7 -- 1.8 -- 1.3* 1.5  PHOS -- -- 2.7 -- 2.3 --     eICU Interventions  kcl 10 x 6 runs mag 1gm   Intervention Category Major Interventions: Electrolyte abnormality - evaluation and management  [name] 07/12/2011, 6:14 PM

## 2011-07-13 ENCOUNTER — Inpatient Hospital Stay (HOSPITAL_COMMUNITY): Payer: Medicare HMO

## 2011-07-13 DIAGNOSIS — I6350 Cerebral infarction due to unspecified occlusion or stenosis of unspecified cerebral artery: Secondary | ICD-10-CM

## 2011-07-13 DIAGNOSIS — A0472 Enterocolitis due to Clostridium difficile, not specified as recurrent: Secondary | ICD-10-CM

## 2011-07-13 DIAGNOSIS — J9600 Acute respiratory failure, unspecified whether with hypoxia or hypercapnia: Secondary | ICD-10-CM

## 2011-07-13 LAB — BLOOD GAS, ARTERIAL
Acid-base deficit: 3.3 mmol/L — ABNORMAL HIGH (ref 0.0–2.0)
Bicarbonate: 18.4 mEq/L — ABNORMAL LOW (ref 20.0–24.0)
FIO2: 1 %
MECHVT: 520 mL
Patient temperature: 98.6
TCO2: 20.6 mmol/L (ref 0–100)
pCO2 arterial: 29.6 mmHg — ABNORMAL LOW (ref 35.0–45.0)
pH, Arterial: 7.439 (ref 7.350–7.450)

## 2011-07-13 LAB — DIFFERENTIAL
Basophils Relative: 0 % (ref 0–1)
Eosinophils Relative: 2 % (ref 0–5)
Lymphocytes Relative: 9 % — ABNORMAL LOW (ref 12–46)
Monocytes Relative: 3 % (ref 3–12)
Neutrophils Relative %: 86 % — ABNORMAL HIGH (ref 43–77)

## 2011-07-13 LAB — CBC
MCV: 84.4 fL (ref 78.0–100.0)
Platelets: 211 10*3/uL (ref 150–400)
RBC: 2.89 MIL/uL — ABNORMAL LOW (ref 4.22–5.81)
RDW: 16.5 % — ABNORMAL HIGH (ref 11.5–15.5)
WBC: 10.2 10*3/uL (ref 4.0–10.5)

## 2011-07-13 LAB — COMPREHENSIVE METABOLIC PANEL
ALT: 12 U/L (ref 0–53)
AST: 13 U/L (ref 0–37)
Albumin: 1.2 g/dL — ABNORMAL LOW (ref 3.5–5.2)
CO2: 21 mEq/L (ref 19–32)
Calcium: 7.6 mg/dL — ABNORMAL LOW (ref 8.4–10.5)
Creatinine, Ser: 1.26 mg/dL (ref 0.50–1.35)
GFR calc non Af Amer: 54 mL/min — ABNORMAL LOW (ref >90)
Sodium: 137 mEq/L (ref 135–145)

## 2011-07-13 LAB — HEPARIN LEVEL (UNFRACTIONATED): Heparin Unfractionated: 0.24 IU/mL — ABNORMAL LOW (ref 0.30–0.70)

## 2011-07-13 LAB — GLUCOSE, CAPILLARY
Glucose-Capillary: 130 mg/dL — ABNORMAL HIGH (ref 70–99)
Glucose-Capillary: 146 mg/dL — ABNORMAL HIGH (ref 70–99)
Glucose-Capillary: 174 mg/dL — ABNORMAL HIGH (ref 70–99)

## 2011-07-13 LAB — PROTIME-INR
INR: 1.21 (ref 0.00–1.49)
Prothrombin Time: 15.6 seconds — ABNORMAL HIGH (ref 11.6–15.2)

## 2011-07-13 LAB — CULTURE, RESPIRATORY W GRAM STAIN: Gram Stain: NONE SEEN

## 2011-07-13 LAB — TRIGLYCERIDES: Triglycerides: 125 mg/dL (ref <150)

## 2011-07-13 LAB — CHOLESTEROL, TOTAL: Cholesterol: 64 mg/dL (ref 0–200)

## 2011-07-13 LAB — PREPARE RBC (CROSSMATCH)

## 2011-07-13 LAB — PREALBUMIN: Prealbumin: <3 mg/dL — ABNORMAL LOW (ref 17.0–34.0)

## 2011-07-13 LAB — CARDIAC PANEL(CRET KIN+CKTOT+MB+TROPI)
CK, MB: 1.9 ng/mL (ref 0.3–4.0)
CK, MB: 1.8 ng/mL (ref 0.3–4.0)

## 2011-07-13 LAB — PHOSPHORUS: Phosphorus: 2.1 mg/dL — ABNORMAL LOW (ref 2.3–4.6)

## 2011-07-13 MED ORDER — FAT EMULSION 20 % IV EMUL
250.0000 mL | INTRAVENOUS | Status: AC
  Administered 2011-07-13: 250 mL via INTRAVENOUS
  Filled 2011-07-13: qty 250

## 2011-07-13 MED ORDER — IOHEXOL 350 MG/ML SOLN
100.0000 mL | Freq: Once | INTRAVENOUS | Status: AC | PRN
  Administered 2011-07-13: 100 mL via INTRAVENOUS

## 2011-07-13 MED ORDER — HEPARIN (PORCINE) IN NACL 100-0.45 UNIT/ML-% IJ SOLN
1450.0000 [IU]/h | INTRAMUSCULAR | Status: DC
  Administered 2011-07-13: 1450 [IU]/h via INTRAVENOUS
  Administered 2011-07-14 – 2011-07-15 (×2): 1600 [IU]/h via INTRAVENOUS
  Administered 2011-07-16: 1450 [IU]/h via INTRAVENOUS
  Filled 2011-07-13 (×8): qty 250

## 2011-07-13 MED ORDER — TRACE MINERALS CR-CU-MN-SE-ZN 10-1000-500-60 MCG/ML IV SOLN
INTRAVENOUS | Status: AC
  Administered 2011-07-13: 18:00:00 via INTRAVENOUS
  Filled 2011-07-13: qty 1800

## 2011-07-13 MED ORDER — POTASSIUM CHLORIDE 10 MEQ/50ML IV SOLN
10.0000 meq | INTRAVENOUS | Status: AC
  Administered 2011-07-13 (×6): 10 meq via INTRAVENOUS
  Filled 2011-07-13: qty 50
  Filled 2011-07-13: qty 100
  Filled 2011-07-13 (×3): qty 50

## 2011-07-13 NOTE — Progress Notes (Signed)
CSW continues to follow this pt to assist with d/c planning needs. PN reviewed. D/C needs / date undetermined at this time. CSW will continue to be available to assist with d/c planning as needed.

## 2011-07-13 NOTE — Progress Notes (Signed)
PARENTERAL NUTRITION CONSULT NOTE - FOLLOW UP  Pharmacy Consult for TNA Indication: SBO  No Known Allergies  Patient Measurements: Height: 5\' 6"  (167.6 cm) Weight: 153 lb 7 oz (69.6 kg) IBW/kg (Calculated) : 63.8  Usual Weight: unable to assess  Vital Signs: Temp: 98 F (36.7 C) (03/11 0800) Temp src: Oral (03/11 0800) BP: 149/67 mmHg (03/11 0600) Pulse Rate: 135  (03/11 0600) Intake/Output from previous day: 03/10 0701 - 03/11 0700 In: 3342 [I.V.:1257; IV Piggyback:765; TPN:1320] Out: 1806 [Urine:750; Emesis/NG output:1050; Stool:6] 3/11 Net + 1.5L  Labs:  Basename 07/13/11 0425 07/12/11 0520 07/11/11 1604 07/11/11 0425  WBC 10.2 9.9 7.8 --  HGB 8.2* 8.7* 7.3* --  HCT 24.4* 25.9* 21.9* --  PLT 211 226 235 --  APTT -- -- -- --  INR 1.21 1.23 -- 1.40     Basename 07/13/11 0425 07/12/11 1655 07/12/11 0520 07/11/11 0425  NA 137 138 138 --  K 2.9* 2.7* 2.9* --  CL 109 109 109 --  CO2 21 21 22  --  GLUCOSE 189* 195* 205* --  BUN 14 16 18  --  CREATININE 1.26 1.29 1.44* --  LABCREA -- -- -- --  CREAT24HRUR -- -- -- --  CALCIUM 7.6* 7.6* 7.3* --  MG 1.8 1.7 -- 1.8  PHOS 2.1* -- -- 2.7  PROT 4.6* -- -- --  ALBUMIN 1.2* -- -- --  AST 13 -- -- --  ALT 12 -- -- --  ALKPHOS 68 -- -- --  BILITOT 0.3 -- -- --  BILIDIR -- -- -- --  IBILI -- -- -- --  PREALBUMIN -- -- -- --  TRIG 125 -- -- --  CHOLHDL -- -- -- --  CHOL 64 -- -- --  3/11 Corrected calcium = 9.8 Estimated Creatinine Clearance: 45.7 ml/min (by C-G formula based on Cr of 1.26).   3/10 CBGs: 236, 202, 182, 190, 213, 130  ( 17 units SSI required/24hr)  Medications:  Scheduled:     . antiseptic oral rinse  15 mL Mouth Rinse q12n4p  . aspirin  300 mg Rectal Once  . chlorhexidine  15 mL Mouth Rinse BID  . insulin aspart  0-9 Units Subcutaneous Q4H  . lip balm  1 application Topical BID  . magnesium sulfate 1 - 4 g bolus IVPB  1 g Intravenous Once  . metoprolol  5 mg Intravenous Q6H  . metronidazole   500 mg Intravenous Q6H  . pantoprazole (PROTONIX) IV  40 mg Intravenous Q24H  . piperacillin-tazobactam (ZOSYN)  IV  3.375 g Intravenous Q8H  . potassium chloride  10 mEq Intravenous Q1 Hr x 4  . potassium chloride  10 mEq Intravenous Q1 Hr x 6  . potassium chloride  10 mEq Intravenous Q1 Hr x 6  . sodium chloride  10-40 mL Intracatheter Q12H  . vancomycin (VANCOCIN) rectal enema  500 mg Rectal Q6H  . vancomycin  1,000 mg Intravenous Q24H  . DISCONTD: magnesium sulfate LVP 250-500 ml  1 g Intravenous Once   Infusions:  NS at 75 ml/hr Heparin 1300 units/hr  Current Nutrition:  Clinimix E5/20 at 5ml/hr  NPO  Assessment:  77 YOM s/p ORIF 2/22, GI bleed 2/28 thought to be 2/2 diverticular disease, CT 3/4 consistent with SBO. NG tube with significant output, plan to start TNA. Last meal recorded 2/28.  POD#3 LOA, serosal/enterotomy repair, fecal dsimpaction  Potassium remains low, despite a total of of KCl IV on 3/10, KCl 60 mEq ordered on 3/11  per MD  Baseline prealbumin 5.4, pending 3/11  CBGs improved with addition of insulin to TNA  Scr elevated post op, but improving daily  Nutritional Goals:   1850-2110 kCal, 79-92 grams of protein per day  Clinimix E5/20 89ml/hr + 20% Lipids 45ml/hr will provide 90gm protein, 1584 kcal on non-lipid days, 2064 kcal on lipid days, average 1790 kcal/week  Plan:   Advance Clinimix to E5/20 to goal rate of 75 ml/hr Fat emulsion 10 ml/hr (MWF only due to ongoing shortage). TNA to contain standard multivitamins and trace elements (MWF only due to ongoing shortage). IVF per MD Maintain 10 units insulin per liter bag of TNA; Continue CBG/SSI q4h TNA lab panels on Mondays & Thursdays.   Lynann Beaver PharmD, BCPS Pager [phone] 07/13/2011 8:50 AM

## 2011-07-13 NOTE — Consult Note (Addendum)
[facility] CONSULT NOTE     Reason for Consult: subacute left hemispheric stroke   HPI:    [name] is an 78 y.o. male history of PE (diagnosed July 2012) and bilateral chronic DVT previously on coumadin at home  who was admitted on 2/20 secondary to a fall and right hip fracture which was repaired 2/22. Post-op he developed bowel obstruction which needed exploratory lap on 3/8. Per CCM notes towards the end-of the surgical case anesthesia reported some difficulty with oxygenation. On 3/9 patient was noted to not move his left upper or lower extremities. Initial CT head showed Old right frontal white matter infarct and minimal chronic small vessel white matter ischemic changes in the left frontal Lobe. CT obtained today shows progression of infarct in right watershed distribution.     Past Medical History  Diagnosis Date  . Asthma   . Hypertension   . Pulmonary embolism   . Peripheral artery disease   . Hypercholesteremia   . Bursitis of right shoulder   . DVT of leg (deep venous thrombosis)     Past Surgical History  Procedure Date  . Hernia repair   . Appendectomy   . Femoral-femoral bypass graft   . Iliac artery stent   . Esophagogastroduodenoscopy 07/03/2011    Procedure: ESOPHAGOGASTRODUODENOSCOPY (EGD);  Surgeon: Shirley Friar, MD;  Location: [facility];  Service: Endoscopy;  Laterality: N/A;  . Compression hip screw 06/26/2011    Procedure: COMPRESSION HIP;  Surgeon: Raymon Mutton, MD;  Location: [facility];  Service: Orthopedics;  Laterality: Right;    History reviewed. No pertinent family history.  Social History:  reports that he has quit smoking. He has never used smokeless tobacco. He reports that he does not drink alcohol or use illicit drugs.  No Known Allergies  Medications:    Scheduled:   . antiseptic oral rinse  15 mL Mouth Rinse q12n4p  . chlorhexidine  15 mL Mouth Rinse BID  . insulin aspart  0-9 Units  Subcutaneous Q4H  . lip balm  1 application Topical BID  . magnesium sulfate 1 - 4 g bolus IVPB  1 g Intravenous Once  . metoprolol  5 mg Intravenous Q6H  . metronidazole  500 mg Intravenous Q6H  . pantoprazole (PROTONIX) IV  40 mg Intravenous Q24H  . piperacillin-tazobactam (ZOSYN)  IV  3.375 g Intravenous Q8H  . potassium chloride  10 mEq Intravenous Q1 Hr x 6  . potassium chloride  10 mEq Intravenous Q1 Hr x 6  . sodium chloride  10-40 mL Intracatheter Q12H  . vancomycin (VANCOCIN) rectal enema  500 mg Rectal Q6H  . vancomycin  1,000 mg Intravenous Q24H  . DISCONTD: magnesium sulfate LVP 250-500 ml  1 g Intravenous Once    Review of Systems - General ROS: negative for - chills, fatigue, fever or hot flashes Hematological and Lymphatic ROS: negative for - bruising, fatigue, jaundice or pallor Endocrine ROS: negative for - hair pattern changes, hot flashes, mood swings or skin changes Respiratory ROS: negative for - cough, hemoptysis, orthopnea or wheezing Cardiovascular ROS: negative for - dyspnea on exertion, orthopnea, palpitations or shortness of breath Gastrointestinal ROS: negative for - abdominal pain, appetite loss, blood in stools, diarrhea or hematemesis Musculoskeletal ROS: negative for - joint pain, joint stiffness, joint swelling or muscle pain Neurological ROS: See HPI Dermatological ROS: negative for  dry skin, pruritus and rash   Blood pressure 174/90, pulse 136, temperature 98.4 F (36.9 C), temperature source Oral, resp. rate 28, height 5\' 6"  (1.676 m), weight 69.6 kg (153 lb 7 oz), SpO2 98.00%.   Neurologic Examination:   Mental Status: Alert, oriented, thought content appropriate.  Speech fluent without evidence of aphasia.  Able to follow 3 step commands without difficulty. Cranial Nerves: II: visual fields grossly normal, pupils equal, round, reactive to light and accommodation III,IV, VI: ptosis not present, extraocular muscles extra-ocular motions intact  bilaterally V,VII: smile symmetric, facial light touch sensation normal bilaterally VIII: hearing normal bilaterally IX,X: gag reflex present XI: trapezius strength/neck flexion strength normal bilaterally XII: tongue strength normal  Motor: Right : Upper extremity    Left:     Upper extremity 4/5 deltoid       0/5 deltoid 4/5 tricep      0/5 tricep 4/5 biceps      2/5 biceps  4/5wrist flexion     2/5 wrist flexion 4/5 wrist extension     2/5 wrist extension 4/5 hand grip      3/5 hand grip  Lower extremity     Lower extremity 2/5 hip flexor      0/5 hip flexor 3/5 hip adductors     0/5 hip adductors 3/5 hip abductors     0/5 hip abductors 3/5 quadricep      0/5 quadriceps  3/5 hamstrings     0/5 hamstrings 5/5 plantar flexion       1/5 plantar flexion 5/5 plantar extension     1/5 plantar extension Tone and bulk:normal tone throughout; no atrophy noted Sensory: Pinprick and light touch intact throughout, bilaterally Deep Tendon Reflexes: 2+ and symmetric throughout UE, 1+ bilateral patella 0/4 at AJ Plantars: Right: downgoing   Left: upgoing Cerebellar: normal finger-to-nose,   Lab Results  Component Value Date/Time   CHOL 64 07/13/2011  4:25 AM    Results for orders placed during the hospital encounter of 06/24/11 (from the past 48 hour(s))  HEPARIN LEVEL (UNFRACTIONATED)     Status: Normal   Collection Time   07/11/11 12:50 PM      Component Value Range Comment   Heparin Unfractionated 0.45  0.30 - 0.70 (IU/mL)   CBC     Status: Abnormal   Collection Time   07/11/11  4:04 PM      Component Value Range Comment   WBC 7.8  4.0 - 10.5 (K/uL)    RBC 2.50 (*) 4.22 - 5.81 (MIL/uL)    Hemoglobin 7.3 (*) 13.0 - 17.0 (g/dL)    HCT 40.9 (*) 81.1 - 52.0 (%)    MCV 87.6  78.0 - 100.0 (fL)    MCH 29.2  26.0 - 34.0 (pg)    MCHC 33.3  30.0 - 36.0 (g/dL)    RDW 91.4 (*) 78.2 - 15.5 (%)    Platelets 235  150 - 400 (K/uL)   GLUCOSE, CAPILLARY     Status: Abnormal   Collection Time     07/11/11  4:21 PM      Component Value Range Comment   Glucose-Capillary 209 (*) 70 - 99 (mg/dL)   PREPARE RBC (CROSSMATCH)     Status: Normal   Collection Time   07/11/11  6:30 PM      Component Value Range Comment   Order Confirmation ORDER PROCESSED BY BLOOD BANK     TYPE AND SCREEN     Status: Normal (Preliminary result)  Collection Time   07/11/11  6:30 PM      Component Value Range Comment   ABO/RH(D) A POS      Antibody Screen NEG      Sample Expiration 07/14/2011      Unit Number [id number]      Blood Component Type RED CELLS,LR      Unit division 00      Status of Unit ISSUED,FINAL      Transfusion Status OK TO TRANSFUSE      Crossmatch Result Compatible      Unit Number [id number]      Blood Component Type RED CELLS,LR      Unit division 00      Status of Unit ALLOCATED      Transfusion Status OK TO TRANSFUSE      Crossmatch Result Compatible     GLUCOSE, CAPILLARY     Status: Abnormal   Collection Time   07/11/11  8:03 PM      Component Value Range Comment   Glucose-Capillary 199 (*) 70 - 99 (mg/dL)    Comment 1 Documented in Chart      Comment 2 Notify RN     GLUCOSE, CAPILLARY     Status: Abnormal   Collection Time   07/11/11 11:17 PM      Component Value Range Comment   Glucose-Capillary 224 (*) 70 - 99 (mg/dL)    Comment 1 Documented in Chart      Comment 2 Notify RN     HEPARIN LEVEL (UNFRACTIONATED)     Status: Abnormal   Collection Time   07/11/11 11:43 PM      Component Value Range Comment   Heparin Unfractionated 0.21 (*) 0.30 - 0.70 (IU/mL)   GLUCOSE, CAPILLARY     Status: Abnormal   Collection Time   07/12/11  2:37 AM      Component Value Range Comment   Glucose-Capillary 236 (*) 70 - 99 (mg/dL)    Comment 1 Documented in Chart      Comment 2 Notify RN     PROTIME-INR     Status: Abnormal   Collection Time   07/12/11  5:20 AM      Component Value Range Comment   Prothrombin Time 15.8 (*) 11.6 - 15.2 (seconds)    INR 1.23  0.00 - 1.49    CBC      Status: Abnormal   Collection Time   07/12/11  5:20 AM      Component Value Range Comment   WBC 9.9  4.0 - 10.5 (K/uL)    RBC 3.07 (*) 4.22 - 5.81 (MIL/uL)    Hemoglobin 8.7 (*) 13.0 - 17.0 (g/dL)    HCT 84.1 (*) 32.4 - 52.0 (%)    MCV 84.4  78.0 - 100.0 (fL)    MCH 28.3  26.0 - 34.0 (pg)    MCHC 33.6  30.0 - 36.0 (g/dL)    RDW 40.1 (*) 02.7 - 15.5 (%)    Platelets 226  150 - 400 (K/uL)   BASIC METABOLIC PANEL     Status: Abnormal   Collection Time   07/12/11  5:20 AM      Component Value Range Comment   Sodium 138  135 - 145 (mEq/L)    Potassium 2.9 (*) 3.5 - 5.1 (mEq/L)    Chloride 109  96 - 112 (mEq/L)    CO2 22  19 - 32 (mEq/L)    Glucose, Bld 205 (*) 70 - 99 (  mg/dL)    BUN 18  6 - 23 (mg/dL)    Creatinine, Ser 1.19 (*) 0.50 - 1.35 (mg/dL)    Calcium 7.3 (*) 8.4 - 10.5 (mg/dL)    GFR calc non Af Amer 46 (*) >90 (mL/min)    GFR calc Af Amer 53 (*) >90 (mL/min)   HEPARIN LEVEL (UNFRACTIONATED)     Status: Abnormal   Collection Time   07/12/11  5:20 AM      Component Value Range Comment   Heparin Unfractionated 0.27 (*) 0.30 - 0.70 (IU/mL)   PROCALCITONIN     Status: Normal   Collection Time   07/12/11  5:20 AM      Component Value Range Comment   Procalcitonin 10.99     CLOSTRIDIUM DIFFICILE BY PCR     Status: Abnormal   Collection Time   07/12/11  6:18 AM      Component Value Range Comment   C difficile by pcr POSITIVE (*) NEGATIVE    GLUCOSE, CAPILLARY     Status: Abnormal   Collection Time   07/12/11  7:30 AM      Component Value Range Comment   Glucose-Capillary 202 (*) 70 - 99 (mg/dL)    Comment 1 Notify RN      Comment 2 Documented in Chart     GLUCOSE, CAPILLARY     Status: Abnormal   Collection Time   07/12/11 11:21 AM      Component Value Range Comment   Glucose-Capillary 182 (*) 70 - 99 (mg/dL)   HEPARIN LEVEL (UNFRACTIONATED)     Status: Normal   Collection Time   07/12/11  3:52 PM      Component Value Range Comment   Heparin Unfractionated 0.30  0.30 -  0.70 (IU/mL)   GLUCOSE, CAPILLARY     Status: Abnormal   Collection Time   07/12/11  3:53 PM      Component Value Range Comment   Glucose-Capillary 190 (*) 70 - 99 (mg/dL)   BASIC METABOLIC PANEL     Status: Abnormal   Collection Time   07/12/11  4:55 PM      Component Value Range Comment   Sodium 138  135 - 145 (mEq/L)    Potassium 2.7 (*) 3.5 - 5.1 (mEq/L)    Chloride 109  96 - 112 (mEq/L)    CO2 21  19 - 32 (mEq/L)    Glucose, Bld 195 (*) 70 - 99 (mg/dL)    BUN 16  6 - 23 (mg/dL)    Creatinine, Ser 1.47  0.50 - 1.35 (mg/dL)    Calcium 7.6 (*) 8.4 - 10.5 (mg/dL)    GFR calc non Af Amer 53 (*) >90 (mL/min)    GFR calc Af Amer 61 (*) >90 (mL/min)   MAGNESIUM     Status: Normal   Collection Time   07/12/11  4:55 PM      Component Value Range Comment   Magnesium 1.7  1.5 - 2.5 (mg/dL)   GLUCOSE, CAPILLARY     Status: Abnormal   Collection Time   07/12/11  7:52 PM      Component Value Range Comment   Glucose-Capillary 213 (*) 70 - 99 (mg/dL)    Comment 1 Documented in Chart      Comment 2 Notify RN     CARDIAC PANEL(CRET KIN+CKTOT+MB+TROPI)     Status: Normal   Collection Time   07/12/11  9:50 PM      Component Value Range  Comment   Total CK 43  7 - 232 (U/L)    CK, MB 1.9  0.3 - 4.0 (ng/mL)    Troponin I <0.30  <0.30 (ng/mL)    Relative Index RELATIVE INDEX IS INVALID  0.0 - 2.5    GLUCOSE, CAPILLARY     Status: Abnormal   Collection Time   07/12/11 11:36 PM      Component Value Range Comment   Glucose-Capillary 130 (*) 70 - 99 (mg/dL)    Comment 1 Documented in Chart      Comment 2 Notify RN     PROTIME-INR     Status: Abnormal   Collection Time   07/13/11  4:25 AM      Component Value Range Comment   Prothrombin Time 15.6 (*) 11.6 - 15.2 (seconds)    INR 1.21  0.00 - 1.49    COMPREHENSIVE METABOLIC PANEL     Status: Abnormal   Collection Time   07/13/11  4:25 AM      Component Value Range Comment   Sodium 137  135 - 145 (mEq/L)    Potassium 2.9 (*) 3.5 - 5.1 (mEq/L)     Chloride 109  96 - 112 (mEq/L)    CO2 21  19 - 32 (mEq/L)    Glucose, Bld 189 (*) 70 - 99 (mg/dL)    BUN 14  6 - 23 (mg/dL)    Creatinine, Ser 4.09  0.50 - 1.35 (mg/dL)    Calcium 7.6 (*) 8.4 - 10.5 (mg/dL)    Total Protein 4.6 (*) 6.0 - 8.3 (g/dL)    Albumin 1.2 (*) 3.5 - 5.2 (g/dL)    AST 13  0 - 37 (U/L)    ALT 12  0 - 53 (U/L)    Alkaline Phosphatase 68  39 - 117 (U/L)    Total Bilirubin 0.3  0.3 - 1.2 (mg/dL)    GFR calc non Af Amer 54 (*) >90 (mL/min)    GFR calc Af Amer 63 (*) >90 (mL/min)   MAGNESIUM     Status: Normal   Collection Time   07/13/11  4:25 AM      Component Value Range Comment   Magnesium 1.8  1.5 - 2.5 (mg/dL)   PHOSPHORUS     Status: Abnormal   Collection Time   07/13/11  4:25 AM      Component Value Range Comment   Phosphorus 2.1 (*) 2.3 - 4.6 (mg/dL)   DIFFERENTIAL     Status: Abnormal   Collection Time   07/13/11  4:25 AM      Component Value Range Comment   Neutrophils Relative 86 (*) 43 - 77 (%)    Lymphocytes Relative 9 (*) 12 - 46 (%)    Monocytes Relative 3  3 - 12 (%)    Eosinophils Relative 2  0 - 5 (%)    Basophils Relative 0  0 - 1 (%)    Neutro Abs 8.8 (*) 1.7 - 7.7 (K/uL)    Lymphs Abs 0.9  0.7 - 4.0 (K/uL)    Monocytes Absolute 0.3  0.1 - 1.0 (K/uL)    Eosinophils Absolute 0.2  0.0 - 0.7 (K/uL)    Basophils Absolute 0.0  0.0 - 0.1 (K/uL)    RBC Morphology POLYCHROMASIA PRESENT   BURR CELLS  CHOLESTEROL, TOTAL     Status: Normal   Collection Time   07/13/11  4:25 AM      Component Value Range Comment   Cholesterol  64  0 - 200 (mg/dL)   TRIGLYCERIDES     Status: Normal   Collection Time   07/13/11  4:25 AM      Component Value Range Comment   Triglycerides 125  <150 (mg/dL)   CBC     Status: Abnormal   Collection Time   07/13/11  4:25 AM      Component Value Range Comment   WBC 10.2  4.0 - 10.5 (K/uL)    RBC 2.89 (*) 4.22 - 5.81 (MIL/uL)    Hemoglobin 8.2 (*) 13.0 - 17.0 (g/dL)    HCT 16.1 (*) 09.6 - 52.0 (%)    MCV 84.4  78.0  - 100.0 (fL)    MCH 28.4  26.0 - 34.0 (pg)    MCHC 33.6  30.0 - 36.0 (g/dL)    RDW 04.5 (*) 40.9 - 15.5 (%)    Platelets 211  150 - 400 (K/uL)   CARDIAC PANEL(CRET KIN+CKTOT+MB+TROPI)     Status: Normal   Collection Time   07/13/11  4:25 AM      Component Value Range Comment   Total CK 35  7 - 232 (U/L)    CK, MB 1.9  0.3 - 4.0 (ng/mL)    Troponin I <0.30  <0.30 (ng/mL)    Relative Index RELATIVE INDEX IS INVALID  0.0 - 2.5    GLUCOSE, CAPILLARY     Status: Abnormal   Collection Time   07/13/11  4:26 AM      Component Value Range Comment   Glucose-Capillary 146 (*) 70 - 99 (mg/dL)    Comment 1 Documented in Chart      Comment 2 Notify RN     GLUCOSE, CAPILLARY     Status: Abnormal   Collection Time   07/13/11  7:47 AM      Component Value Range Comment   Glucose-Capillary 151 (*) 70 - 99 (mg/dL)    Comment 1 Documented in Chart      Comment 2 Notify RN     HEPARIN LEVEL (UNFRACTIONATED)     Status: Abnormal   Collection Time   07/13/11  8:45 AM      Component Value Range Comment   Heparin Unfractionated 0.23 (*) 0.30 - 0.70 (IU/mL)   PREPARE RBC (CROSSMATCH)     Status: Normal   Collection Time   07/13/11  9:30 AM      Component Value Range Comment   Order Confirmation ORDER PROCESSED BY BLOOD BANK     GLUCOSE, CAPILLARY     Status: Abnormal   Collection Time   07/13/11 11:25 AM      Component Value Range Comment   Glucose-Capillary 174 (*) 70 - 99 (mg/dL)     Ct Head Wo Contrast  07/11/2011  *RADIOLOGY REPORT*  Clinical Data: Left hemiplegia.  CT HEAD WITHOUT CONTRAST  Technique:  Contiguous axial images were obtained from the base of the skull through the vertex without contrast.  Comparison: None.  Findings: Mildly enlarged ventricles and subarachnoid spaces. White matter low density in the right frontal lobe.  Minimal white matter low density in the left frontal lobe.  No intracranial hemorrhage, mass lesion or CT evidence of acute infarction.  Small, opacified right  maxillary sinus.  Left maxillary sinus mucosal thickening with a maximum thickness of 9 mm.  IMPRESSION:  1.  No acute CT abnormality. 2.  Mild atrophy. 3.  Old right frontal white matter infarct and minimal chronic small vessel white matter ischemic changes  in the left frontal lobe. 4.  Hypoplastic, opacified right maxillary sinus and chronic left maxillary sinusitis.  These results were called by telephone on 07/11/2011  at  2100 hours to  Tvedte, the patient's nurse, who verbally acknowledged these results.  Original Report Authenticated By: Darrol Angel, M.D.   Dg Chest Port 1 View  07/12/2011  *RADIOLOGY REPORT*  Clinical Data: Evaluate endotracheal tube and line placement. Hypertension.  PORTABLE CHEST - 1 VIEW  Comparison: Chest x-ray 07/11/2011.  Findings: The patient has been extubated. Nasogastric tube is seen extending into the stomach, however, the tip of the nasogastric tube extends below the lower margin of the image.  Right upper extremity PICC with tip terminating in the mid superior vena cava. Lung volumes remain low, there continues to be bibasilar opacities (right greater than left), favored to represent areas of atelectasis.  Small left and small - moderate right-sided pleural effusions are unchanged.  Pulmonary venous congestion without frank pulmonary edema.  Heart size is normal. The patient is rotated to the left on today's exam, resulting in distortion of the mediastinal contours and reduced diagnostic sensitivity and specificity for mediastinal pathology.  IMPRESSION: 1.  Support apparatus, as above.  The patient has been extubated. 2.  Low lung volumes with persistent bibasilar subsegmental atelectasis and bilateral pleural effusions (right greater than left).  Original Report Authenticated By: Florencia Reasons, M.D.   Carotid duplex completed.  Preliminary report: Right: Occluded internal carotid artery. Occlusion appears to be from thrombus not plaque Left: No evidence of  hemodynamically significant internal carotid artery stenosis. Bilateral: Vertebral artery flow is antegrade.   2 D echo pending   Assessment/Plan:   78 YO male S/p right hip ORIF, acute on chronic kidney disease, malnutrition, recent GI bleed with AC held but placed back on heparin due to new CVA.  There is noted A-Fib on 3/8 currently on telemetry currently sinus tachycardia.    Recommend:  1) CT angio head and neck 2) 2-D echo 3) FLP and HBA1C 4) PT 5) cardiology consult to further evaluate A-fib. 6) agree with continue Heparin  7) continue to treat underlying medical issues.     Felicie Morn PA-C [facility] [phone]  07/13/2011, 12:41 PM

## 2011-07-13 NOTE — Progress Notes (Signed)
PT note--chart reviewed. Noted that PT was discontinued from services. Please reorder if /when indicated. Thank you.

## 2011-07-13 NOTE — Progress Notes (Signed)
3 Days Post-Op  Subjective: Bowels moving.  Feels  Bloated.  Objective: Vital signs in last 24 hours: Temp:  [97.8 F (36.6 C)-98.7 F (37.1 C)] 98.3 F (36.8 C) (03/11 0000) Pulse Rate:  [113-136] 135  (03/11 0600) Resp:  [18-28] 22  (03/11 0600) BP: (110-153)/(57-83) 149/67 mmHg (03/11 0600) SpO2:  [97 %-100 %] 98 % (03/11 0600) Last BM Date: 07/12/11  Intake/Output from previous day: 03/10 0701 - 03/11 0700 In: 3342 [I.V.:1257; IV Piggyback:765; TPN:1320] Out: 1806 [Urine:750; Emesis/NG output:1050; Stool:6] Intake/Output this shift:    PE: Abd-slightly firm and distended, midline incision clean, hypoactive BS Neuro-slight movement of left hand and foot  Lab Results:   Basename 07/13/11 0425 07/12/11 0520  WBC 10.2 9.9  HGB 8.2* 8.7*  HCT 24.4* 25.9*  PLT 211 226   BMET  Basename 07/13/11 0425 07/12/11 1655  NA 137 138  K 2.9* 2.7*  CL 109 109  CO2 21 21  GLUCOSE 189* 195*  BUN 14 16  CREATININE 1.26 1.29  CALCIUM 7.6* 7.6*   PT/INR  Basename 07/13/11 0425 07/12/11 0520  LABPROT 15.6* 15.8*  INR 1.21 1.23   Comprehensive Metabolic Panel:    Component Value Date/Time   NA 137 07/13/2011 0425   K 2.9* 07/13/2011 0425   CL 109 07/13/2011 0425   CO2 21 07/13/2011 0425   BUN 14 07/13/2011 0425   CREATININE 1.26 07/13/2011 0425   GLUCOSE 189* 07/13/2011 0425   CALCIUM 7.6* 07/13/2011 0425   AST 13 07/13/2011 0425   ALT 12 07/13/2011 0425   ALKPHOS 68 07/13/2011 0425   BILITOT 0.3 07/13/2011 0425   PROT 4.6* 07/13/2011 0425   ALBUMIN 1.2* 07/13/2011 0425     Studies/Results: Ct Head Wo Contrast  07/11/2011  *RADIOLOGY REPORT*  Clinical Data: Left hemiplegia.  CT HEAD WITHOUT CONTRAST  Technique:  Contiguous axial images were obtained from the base of the skull through the vertex without contrast.  Comparison: None.  Findings: Mildly enlarged ventricles and subarachnoid spaces. White matter low density in the right frontal lobe.  Minimal white matter low density  in the left frontal lobe.  No intracranial hemorrhage, mass lesion or CT evidence of acute infarction.  Small, opacified right maxillary sinus.  Left maxillary sinus mucosal thickening with a maximum thickness of 9 mm.  IMPRESSION:  1.  No acute CT abnormality. 2.  Mild atrophy. 3.  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 4.  Hypoplastic, opacified right maxillary sinus and chronic left maxillary sinusitis.  These results were called by telephone on 07/11/2011  at  2100 hours to  Tvedte, the patient's nurse, who verbally acknowledged these results.  Original Report Authenticated By: Darrol Angel, M.D.   Dg Chest Port 1 View  07/12/2011  *RADIOLOGY REPORT*  Clinical Data: Evaluate endotracheal tube and line placement. Hypertension.  PORTABLE CHEST - 1 VIEW  Comparison: Chest x-ray 07/11/2011.  Findings: The patient has been extubated. Nasogastric tube is seen extending into the stomach, however, the tip of the nasogastric tube extends below the lower margin of the image.  Right upper extremity PICC with tip terminating in the mid superior vena cava. Lung volumes remain low, there continues to be bibasilar opacities (right greater than left), favored to represent areas of atelectasis.  Small left and small - moderate right-sided pleural effusions are unchanged.  Pulmonary venous congestion without frank pulmonary edema.  Heart size is  normal. The patient is rotated to the left on today's exam, resulting in distortion of the mediastinal contours and reduced diagnostic sensitivity and specificity for mediastinal pathology.  IMPRESSION: 1.  Support apparatus, as above.  The patient has been extubated. 2.  Low lung volumes with persistent bibasilar subsegmental atelectasis and bilateral pleural effusions (right greater than left).  Original Report Authenticated By: Florencia Reasons, M.D.    Anti-infectives: Anti-infectives       Start     Dose/Rate Route Frequency Ordered Stop   07/12/11 1800   metroNIDAZOLE (FLAGYL) IVPB 500 mg        500 mg 100 mL/hr over 60 Minutes Intravenous 4 times per day 07/12/11 1645     07/12/11 1530   vancomycin (VANCOCIN) 500 mg in sodium chloride irrigation 0.9 % 100 mL enema        500 mg 66.7 mL/hr over 90 Minutes Rectal 4 times per day 07/12/11 1450 07/13/11 0108   07/11/11 2359   vancomycin (VANCOCIN) IVPB 1000 mg/200 mL premix        1,000 mg 200 mL/hr over 60 Minutes Intravenous Every 24 hours 07/11/11 0807     07/10/11 1800   vancomycin (VANCOCIN) 500 mg in sodium chloride 0.9 % 100 mL IVPB  Status:  Discontinued        500 mg 100 mL/hr over 60 Minutes Intravenous Every 12 hours 07/10/11 1641 07/11/11 0806   07/10/11 1800  piperacillin-tazobactam (ZOSYN) IVPB 3.375 g       3.375 g 12.5 mL/hr over 240 Minutes Intravenous Every 8 hours 07/10/11 1708     07/10/11 0800   ertapenem (INVANZ) 1 g in sodium chloride 0.9 % 50 mL IVPB  Status:  Discontinued        1 g 100 mL/hr over 30 Minutes Intravenous Every 24 hours 07/10/11 0739 07/10/11 1659   06/26/11 2000   ceFAZolin (ANCEF) IVPB 1 g/50 mL premix        1 g 100 mL/hr over 30 Minutes Intravenous Every 6 hours 06/26/11 1658 06/27/11 0857          Assessment Active Problems:  1.  S/p exploratory lap with LOA and repair of enterotomy-ileus as expected  2.  Acute CVA  3.  H/O PE on chronic anticoagulation-on Heparin  4.  Hypokalemia-potassium replacement ordered  5.  Anemia  6.  C. Diff-Flagyl started   LOS: 19 days   Plan: Wait for ileus to resolve.  Ok to start some PT and OT from my standpoint.   [name] 07/13/2011

## 2011-07-13 NOTE — Progress Notes (Signed)
Name: [name] MRN: [id number] DOB: April 05, 1934    LOS: 19  [facility]  Brief patient profile:  78 YO male w/ history of PE, and CRI   admitted on 2/20 s/p a fall resulting in right hip fracture which was repaired by Ortho on 2/22. Post-op he developed GIB and bowel obstruction. He was followed conservatively, but ultimately went for Exploratory lap on 3/8, he underwent lysis of adhesion. Towards the end-of the surgical case anesthesia reported some difficulty with oxygenation. In the PACU he was unable to be extubated due to rising FIO2 requirements and increased respiratory distress. PCCM was asked to evaluate.   Lines / Drains: OETT 3/8>>> 3/9 PICC line 3/7>>>  Cultures: Sputum 3/8> nl flora PCT 3/8   5.31  PCT 3/9  18.64 PCT 3/10 10.99 3/10 c diff> POSITIVE  Antibiotics: invanz 3/8 (abd coverage)>>>3/8 Zosyn 3/8 (HCAP)>>> vanc 3/8 (HCAP)>>> 3/10 flagyl(cdif)>>  Tests / Events: 2/22 right hip ORIF 3/8 expl lap w/ lysis of adhesion 3/8 LE dopplers>>> B LE DVT that appear to be chronic  Subjective:  Follows commands. Weak on lt side.  Vital Signs: Temp:  [98 F (36.7 C)-98.7 F (37.1 C)] 98 F (36.7 C) (03/11 0800) Pulse Rate:  [113-136] 136  (03/11 1000) Resp:  [18-28] 28  (03/11 1000) BP: (110-174)/(57-90) 174/90 mmHg (03/11 1000) SpO2:  [97 %-100 %] 98 % (03/11 1000)  On 2lpm      . sodium chloride 75 mL/hr at 07/12/11 1639  . TPN (CLINIMIX) +/- additives     And  . fat emulsion    . heparin 1,300 Units/hr (07/13/11 1000)  . TPN (CLINIMIX) +/- additives 60 mL/hr at 07/12/11 1813  . TPN (CLINIMIX) +/- additives 60 mL/hr at 07/13/11 1000  . TPN (CLINIMIX) +/- additives 60 mL/hr at 07/11/11 1745  . DISCONTD: TPN (CLINIMIX) +/- additives    . DISCONTD: TPN (CLINIMIX) +/- additives    . DISCONTD: TPN (CLINIMIX) +/- additives      Intake/Output Summary (Last 24 hours) at 07/13/11 1253 Last data filed at 07/13/11 1000  Gross per 24  hour  Intake   3069 ml  Output   1526 ml  Net   1543 ml    Physical Examination: General:  Chronically ill appearing AAM  Neuro:  Awake, follows commands, very weak globally but now decreased movement LUE or LLE, L neglect HEENT:  No JVD Cardiovascular:  Tachy rrr Lungs:  Diffuse rhonchi Abdomen:  abd tender, mid abd incision CD&I Musculoskeletal:  intact   Labs and Imaging:   Lab 07/13/11 0425 07/12/11 1655 07/12/11 0520  NA 137 138 138  K 2.9* 2.7* 2.9*  CL 109 109 109  CO2 21 21 22   BUN 14 16 18   CREATININE 1.26 1.29 1.44*  GLUCOSE 189* 195* 205*    Lab 07/13/11 0425 07/12/11 0520 07/11/11 1604  HGB 8.2* 8.7* 7.3*  HCT 24.4* 25.9* 21.9*  WBC 10.2 9.9 7.8  PLT 211 226 235    Assessment and Plan: SBO (small bowel obstruction) now s/p exp lap with lysis of adhesions 3/8 - CCS following - NGT in place   Acute respiratory failure with Post-op hypoxia: CXR showing new right sided infiltrate vs Atelectasis. Does have history of PE (pulmonary embolism diagnosed in July 2012). He has B chronic DVT on dopplers here, has been on anticoagulation up until bleeding noted on 2/27. Extubated 3/9 Plan: -scheduled BDs  -HCAP coverage will consider d/c  - LE dopplers >> positive  for chronic DVT -continue IV heparin for DVT and presumed CVA -PUSH pulm toilet, high risk reintubation due to CVA  New L hemiparesis and apparent CVA -MRI brain ordered, but  Can't do with staples in place, will have to defer -continue heparin gtt -add ASA PR -will need to discuss the CVA, goal of care with his wife, especially re reintubation if he cannot protect airway  Fracture of hip, right, closed 2/22 Plan: -post-op care per ortho  A-fib. With tachycardia o/n 3/8, suspect due to demand of acute illness, dilt gtt started 3/8 Plan: -dilt weaned to off - metoprolol prn  And scheduled q6h - heparin  Hypokalemia  Lab 07/13/11 0425 07/12/11 1655 07/12/11 0520  K 2.9* 2.7* 2.9*   plan: -follow and replace.  Anemia, on heparin - 1 u PRBC this am - follow CBC  H/o renal insuff Recent Labs  Basename 07/13/11 0425 07/12/11 1655 07/12/11 0520   CREATININE 1.26 1.29 1.44*  plan: -follow UOP and S Cr  Best practices / Disposition: -->ICU status, will change to PCCM service if he remains intubated -->full code -->IV Heparin gtt -->Protonix for GI Px     Brett Canales Minor ACNP Adolph Pollack PCCM Pager [phone] till 3 pm If no answer page [phone] 07/13/2011, 10:07 AM   Pt independently  seen and examined and available cxr's reviewed and I agree with above findings/ imp/ plan    Sandrea Hughs, MD Pulmonary and Critical Care Medicine [facility] Cell [phone]

## 2011-07-13 NOTE — Progress Notes (Signed)
VASCULAR LAB PRELIMINARY  PRELIMINARY  PRELIMINARY  PRELIMINARY  Carotid duplex  completed.    Preliminary report:  Right:  Occluded internal carotid artery.  Occlusion appears to be from thrombus not plaque  Left:  No evidence of hemodynamically significant internal carotid artery stenosis.  Bilateral:  Vertebral artery flow is antegrade.     Terance Hart, RVT 07/13/2011, 9:35 AM

## 2011-07-13 NOTE — Progress Notes (Signed)
ANTIBIOTIC CONSULT NOTE - FOLLOW UP  Pharmacy Consult for Vancomycin and Zosyn Indication: pneumonia  No Known Allergies  Patient Measurements: Height: 5\' 6"  (167.6 cm) Weight: 153 lb 7 oz (69.6 kg) IBW/kg (Calculated) : 63.8   Vital Signs: Temp: 98 F (36.7 C) (03/11 0800) Temp src: Oral (03/11 0800) BP: 149/67 mmHg (03/11 0600) Pulse Rate: 135  (03/11 0600)  Intake/Output from previous day: 03/10 0701 - 03/11 0700 In: 3342 [I.V.:1257; IV Piggyback:765; TPN:1320] Out: 1806 [Urine:750; Emesis/NG output:1050; Stool:6]  Labs:  [facility] 07/13/11 0425 07/12/11 1655 07/12/11 0520 07/11/11 1604  WBC 10.2 -- 9.9 7.8  HGB 8.2* -- 8.7* 7.3*  PLT 211 -- 226 235  LABCREA -- -- -- --  CREATININE 1.26 1.29 1.44* --   Estimated Creatinine Clearance: 45.7 ml/min (by C-G formula based on Cr of 1.26).  No results found for this basename: VANCOTROUGH:2,VANCOPEAK:2,VANCORANDOM:2,GENTTROUGH:2,GENTPEAK:2,GENTRANDOM:2,TOBRATROUGH:2,TOBRAPEAK:2,TOBRARND:2,AMIKACINPEAK:2,AMIKACINTROU:2,AMIKACIN:2, in the last 72 hours   Microbiology: Recent Results (from the past 720 hour(s))  MRSA PCR SCREENING     Status: Normal   Collection Time   06/26/11 12:10 PM      Component Value Range Status Comment   MRSA by PCR NEGATIVE  NEGATIVE  Final   MRSA PCR SCREENING     Status: Normal   Collection Time   07/03/11  4:21 AM      Component Value Range Status Comment   MRSA by PCR NEGATIVE  NEGATIVE  Final   CULTURE, RESPIRATORY     Status: Normal (Preliminary result)   Collection Time   07/10/11  5:00 PM      Component Value Range Status Comment   Specimen Description TRACHEAL ASPIRATE   Final    Special Requests NONE   Final    Gram Stain     Final    Value: NO WBC SEEN     RARE SQUAMOUS EPITHELIAL CELLS PRESENT     NO ORGANISMS SEEN   Culture Non-Pathogenic Oropharyngeal-type Flora Isolated.   Final    Report Status PENDING   Incomplete   CLOSTRIDIUM DIFFICILE BY PCR     Status: Abnormal   Collection Time   07/12/11  6:18 AM      Component Value Range Status Comment   C difficile by pcr POSITIVE (*) NEGATIVE  Final     Anti-infectives     Start     Dose/Rate Route Frequency Ordered Stop   07/12/11 1800   metroNIDAZOLE (FLAGYL) IVPB 500 mg        500 mg 100 mL/hr over 60 Minutes Intravenous 4 times per day 07/12/11 1645     07/12/11 1530   vancomycin (VANCOCIN) 500 mg in sodium chloride irrigation 0.9 % 100 mL enema        500 mg 66.7 mL/hr over 90 Minutes Rectal 4 times per day 07/12/11 1450 07/13/11 0108   07/11/11 2359   vancomycin (VANCOCIN) IVPB 1000 mg/200 mL premix        1,000 mg 200 mL/hr over 60 Minutes Intravenous Every 24 hours 07/11/11 0807     07/10/11 1800   vancomycin (VANCOCIN) 500 mg in sodium chloride 0.9 % 100 mL IVPB  Status:  Discontinued        500 mg 100 mL/hr over 60 Minutes Intravenous Every 12 hours 07/10/11 1641 07/11/11 0806   07/10/11 1800  piperacillin-tazobactam (ZOSYN) IVPB 3.375 g       3.375 g 12.5 mL/hr over 240 Minutes Intravenous Every 8 hours 07/10/11 1708     07/10/11  0800   ertapenem (INVANZ) 1 g in sodium chloride 0.9 % 50 mL IVPB  Status:  Discontinued        1 g 100 mL/hr over 30 Minutes Intravenous Every 24 hours 07/10/11 0739 07/10/11 1659   06/26/11 2000   ceFAZolin (ANCEF) IVPB 1 g/50 mL premix        1 g 100 mL/hr over 30 Minutes Intravenous Every 6 hours 06/26/11 1658 06/27/11 0857          Assessment:  78 yo M with persistent small bowel obstruction s/p lysis of adhesions, now with acute respiratory failure with suspected pneumonia.  Day # 4 vancomycin and zosyn (day #2 Flagyl IV started per MD for +Cdiff)  Extubated 3/9, WBC normal, SCr improving, Afebrile  MRSA PCR negative, tracheal aspirate culture shows only flora  Goal of Therapy:  Vancomycin trough level 15-20 mcg/ml  Plan:   Continue Vancomycin and Zosyn as ordered  Measure antibiotic drug levels at steady state  Follow up culture  results, renal function, and labs as available.    Lynann Beaver PharmD  Pager [phone] 07/13/2011 9:15 AM

## 2011-07-13 NOTE — Progress Notes (Signed)
ANTICOAGULATION CONSULT NOTE - Follow Up Consult  Pharmacy Consult for Heparin Indication: VTE and r/o CVA  No Known Allergies  Patient Measurements: Height: 5\' 6"  (167.6 cm) Weight: 153 lb 7 oz (69.6 kg) IBW/kg (Calculated) : 63.8   Vital Signs: Temp: 98 F (36.7 C) (03/11 0800) Temp src: Oral (03/11 0800) BP: 174/90 mmHg (03/11 1000) Pulse Rate: 136  (03/11 1000)  Labs:  Basename 07/13/11 0845 07/13/11 0425 07/12/11 2150 07/12/11 1655 07/12/11 1552 07/12/11 0520 07/11/11 1604 07/11/11 0425  HGB -- 8.2* -- -- -- 8.7* -- --  HCT -- 24.4* -- -- -- 25.9* 21.9* --  PLT -- 211 -- -- -- 226 235 --  APTT -- -- -- -- -- -- -- --  LABPROT -- 15.6* -- -- -- 15.8* -- 17.4*  INR -- 1.21 -- -- -- 1.23 -- 1.40  HEPARINUNFRC 0.23* -- -- -- 0.30 0.27* -- --  CREATININE -- 1.26 -- 1.29 -- 1.44* -- --  CKTOTAL -- 35 43 -- -- -- -- --  CKMB -- 1.9 1.9 -- -- -- -- --  TROPONINI -- <0.30 <0.30 -- -- -- -- --   Estimated Creatinine Clearance: 45.7 ml/min (by C-G formula based on Cr of 1.26).   Medications:  Heparin Infusion at 1300 units/hr  Assessment:  78 yo M, admit on chronic warfarin for history of VTE, s/p ORIF on 2/22, currently on heparin drip  Questionable CVA with left hemiparesis.  Preliminary carotid duplex shows occluded internal carotid artery, appears to be from thrombus  Heparin level is low  Goal of Therapy:  Heparin level 0.3-0.7 units/ml   Plan:   Increase heparin infusion to 1450 units/hr  Re-check heparin level in 8 hours  Daily heparin level, CBC   Lynann Beaver PharmD, BCPS Pager [phone] 07/13/2011 10:08 AM

## 2011-07-13 NOTE — Progress Notes (Signed)
Subjective: Patient alert and oriented, states he slept well. His daughters in the room, case reviewed with her. Advanced noted over the weekend, CVA with left hemiparesis. Does not appear that neurology is seeing patient, patient also is now C. Difficile positive. He still continues to have NG tube an ileus as well as hypokalemia. There is no recurrent GI bleed however hemoglobin is still low  Objective: Vital signs in last 24 hours: Temp:  [98 F (36.7 C)-98.7 F (37.1 C)] 98 F (36.7 C) (03/11 0800) Pulse Rate:  [113-136] 135  (03/11 0600) Resp:  [18-27] 22  (03/11 0600) BP: (110-153)/(57-83) 149/67 mmHg (03/11 0600) SpO2:  [97 %-100 %] 98 % (03/11 0600) Weight change:  Last BM Date: 07/12/11  Intake/Output from previous day: 03/10 0701 - 03/11 0700 In: 3342 [I.V.:1257; IV Piggyback:765; TPN:1320] Out: 1806 [Urine:750; Emesis/NG output:1050; Stool:6] Intake/Output this shift:    General appearance: alert and cooperative Resp: clear to auscultation bilaterally Cardio: tachycardic but regular GI: distended, decreased bowel sounds Extremities: PAS hose in place, no edema  Lab Results:  Results for orders placed during the hospital encounter of 06/24/11 (from the past 24 hour(s))  GLUCOSE, CAPILLARY     Status: Abnormal   Collection Time   07/12/11 11:21 AM      Component Value Range   Glucose-Capillary 182 (*) 70 - 99 (mg/dL)  HEPARIN LEVEL (UNFRACTIONATED)     Status: Normal   Collection Time   07/12/11  3:52 PM      Component Value Range   Heparin Unfractionated 0.30  0.30 - 0.70 (IU/mL)  GLUCOSE, CAPILLARY     Status: Abnormal   Collection Time   07/12/11  3:53 PM      Component Value Range   Glucose-Capillary 190 (*) 70 - 99 (mg/dL)  BASIC METABOLIC PANEL     Status: Abnormal   Collection Time   07/12/11  4:55 PM      Component Value Range   Sodium 138  135 - 145 (mEq/L)   Potassium 2.7 (*) 3.5 - 5.1 (mEq/L)   Chloride 109  96 - 112 (mEq/L)   CO2 21  19 - 32  (mEq/L)   Glucose, Bld 195 (*) 70 - 99 (mg/dL)   BUN 16  6 - 23 (mg/dL)   Creatinine, Ser 4.54  0.50 - 1.35 (mg/dL)   Calcium 7.6 (*) 8.4 - 10.5 (mg/dL)   GFR calc non Af Amer 53 (*) >90 (mL/min)   GFR calc Af Amer 61 (*) >90 (mL/min)  MAGNESIUM     Status: Normal   Collection Time   07/12/11  4:55 PM      Component Value Range   Magnesium 1.7  1.5 - 2.5 (mg/dL)  GLUCOSE, CAPILLARY     Status: Abnormal   Collection Time   07/12/11  7:52 PM      Component Value Range   Glucose-Capillary 213 (*) 70 - 99 (mg/dL)   Comment 1 Documented in Chart     Comment 2 Notify RN    CARDIAC PANEL(CRET KIN+CKTOT+MB+TROPI)     Status: Normal   Collection Time   07/12/11  9:50 PM      Component Value Range   Total CK 43  7 - 232 (U/L)   CK, MB 1.9  0.3 - 4.0 (ng/mL)   Troponin I <0.30  <0.30 (ng/mL)   Relative Index RELATIVE INDEX IS INVALID  0.0 - 2.5   GLUCOSE, CAPILLARY     Status: Abnormal  Collection Time   07/12/11 11:36 PM      Component Value Range   Glucose-Capillary 130 (*) 70 - 99 (mg/dL)   Comment 1 Documented in Chart     Comment 2 Notify RN    PROTIME-INR     Status: Abnormal   Collection Time   07/13/11  4:25 AM      Component Value Range   Prothrombin Time 15.6 (*) 11.6 - 15.2 (seconds)   INR 1.21  0.00 - 1.49   COMPREHENSIVE METABOLIC PANEL     Status: Abnormal   Collection Time   07/13/11  4:25 AM      Component Value Range   Sodium 137  135 - 145 (mEq/L)   Potassium 2.9 (*) 3.5 - 5.1 (mEq/L)   Chloride 109  96 - 112 (mEq/L)   CO2 21  19 - 32 (mEq/L)   Glucose, Bld 189 (*) 70 - 99 (mg/dL)   BUN 14  6 - 23 (mg/dL)   Creatinine, Ser 0.98  0.50 - 1.35 (mg/dL)   Calcium 7.6 (*) 8.4 - 10.5 (mg/dL)   Total Protein 4.6 (*) 6.0 - 8.3 (g/dL)   Albumin 1.2 (*) 3.5 - 5.2 (g/dL)   AST 13  0 - 37 (U/L)   ALT 12  0 - 53 (U/L)   Alkaline Phosphatase 68  39 - 117 (U/L)   Total Bilirubin 0.3  0.3 - 1.2 (mg/dL)   GFR calc non Af Amer 54 (*) >90 (mL/min)   GFR calc Af Amer 63 (*)  >90 (mL/min)  MAGNESIUM     Status: Normal   Collection Time   07/13/11  4:25 AM      Component Value Range   Magnesium 1.8  1.5 - 2.5 (mg/dL)  PHOSPHORUS     Status: Abnormal   Collection Time   07/13/11  4:25 AM      Component Value Range   Phosphorus 2.1 (*) 2.3 - 4.6 (mg/dL)  DIFFERENTIAL     Status: Abnormal   Collection Time   07/13/11  4:25 AM      Component Value Range   Neutrophils Relative 86 (*) 43 - 77 (%)   Lymphocytes Relative 9 (*) 12 - 46 (%)   Monocytes Relative 3  3 - 12 (%)   Eosinophils Relative 2  0 - 5 (%)   Basophils Relative 0  0 - 1 (%)   Neutro Abs 8.8 (*) 1.7 - 7.7 (K/uL)   Lymphs Abs 0.9  0.7 - 4.0 (K/uL)   Monocytes Absolute 0.3  0.1 - 1.0 (K/uL)   Eosinophils Absolute 0.2  0.0 - 0.7 (K/uL)   Basophils Absolute 0.0  0.0 - 0.1 (K/uL)   RBC Morphology POLYCHROMASIA PRESENT    CHOLESTEROL, TOTAL     Status: Normal   Collection Time   07/13/11  4:25 AM      Component Value Range   Cholesterol 64  0 - 200 (mg/dL)  TRIGLYCERIDES     Status: Normal   Collection Time   07/13/11  4:25 AM      Component Value Range   Triglycerides 125  <150 (mg/dL)  CBC     Status: Abnormal   Collection Time   07/13/11  4:25 AM      Component Value Range   WBC 10.2  4.0 - 10.5 (K/uL)   RBC 2.89 (*) 4.22 - 5.81 (MIL/uL)   Hemoglobin 8.2 (*) 13.0 - 17.0 (g/dL)   HCT 11.9 (*) 14.7 -  52.0 (%)   MCV 84.4  78.0 - 100.0 (fL)   MCH 28.4  26.0 - 34.0 (pg)   MCHC 33.6  30.0 - 36.0 (g/dL)   RDW 96.0 (*) 45.4 - 15.5 (%)   Platelets 211  150 - 400 (K/uL)  CARDIAC PANEL(CRET KIN+CKTOT+MB+TROPI)     Status: Normal   Collection Time   07/13/11  4:25 AM      Component Value Range   Total CK 35  7 - 232 (U/L)   CK, MB 1.9  0.3 - 4.0 (ng/mL)   Troponin I <0.30  <0.30 (ng/mL)   Relative Index RELATIVE INDEX IS INVALID  0.0 - 2.5   GLUCOSE, CAPILLARY     Status: Abnormal   Collection Time   07/13/11  4:26 AM      Component Value Range   Glucose-Capillary 146 (*) 70 - 99 (mg/dL)    Comment 1 Documented in Chart     Comment 2 Notify RN    GLUCOSE, CAPILLARY     Status: Abnormal   Collection Time   07/13/11  7:47 AM      Component Value Range   Glucose-Capillary 151 (*) 70 - 99 (mg/dL)   Comment 1 Documented in Chart     Comment 2 Notify RN        Studies/Results: Ct Head Wo Contrast  07/11/2011  *RADIOLOGY REPORT*  Clinical Data: Left hemiplegia.  CT HEAD WITHOUT CONTRAST  Technique:  Contiguous axial images were obtained from the base of the skull through the vertex without contrast.  Comparison: None.  Findings: Mildly enlarged ventricles and subarachnoid spaces. White matter low density in the right frontal lobe.  Minimal white matter low density in the left frontal lobe.  No intracranial hemorrhage, mass lesion or CT evidence of acute infarction.  Small, opacified right maxillary sinus.  Left maxillary sinus mucosal thickening with a maximum thickness of 9 mm.  IMPRESSION:  1.  No acute CT abnormality. 2.  Mild atrophy. 3.  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 4.  Hypoplastic, opacified right maxillary sinus and chronic left maxillary sinusitis.  These results were called by telephone on 07/11/2011  at  2100 hours to  Tvedte, the patient's nurse, who verbally acknowledged these results.  Original Report Authenticated By: Darrol Angel, M.D.   Dg Chest Port 1 View  07/12/2011  *RADIOLOGY REPORT*  Clinical Data: Evaluate endotracheal tube and line placement. Hypertension.  PORTABLE CHEST - 1 VIEW  Comparison: Chest x-ray 07/11/2011.  Findings: The patient has been extubated. Nasogastric tube is seen extending into the stomach, however, the tip of the nasogastric tube extends below the lower margin of the image.  Right upper extremity PICC with tip terminating in the mid superior vena cava. Lung volumes remain low, there continues to be bibasilar opacities (right greater than left),  favored to represent areas of atelectasis.  Small left and small - moderate right-sided pleural effusions are unchanged.  Pulmonary venous congestion without frank pulmonary edema.  Heart size is normal. The patient is rotated to the left on today's exam, resulting in distortion of the mediastinal contours and reduced diagnostic sensitivity and specificity for mediastinal pathology.  IMPRESSION: 1.  Support apparatus, as above.  The patient has been extubated. 2.  Low lung volumes with persistent bibasilar subsegmental atelectasis and bilateral pleural effusions (right greater than left).  Original Report Authenticated By: Florencia Reasons, M.D.  Medications:  Prior to Admission:  Prescriptions prior to admission  Medication Sig Dispense Refill  . clopidogrel (PLAVIX) 75 MG tablet Take 75 mg by mouth daily.      Marland Kitchen diltiazem (DILACOR XR) 180 MG 24 hr capsule Take 180 mg by mouth daily.      . metoprolol (LOPRESSOR) 100 MG tablet Take 100 mg by mouth 2 (two) times daily.      . pantoprazole (PROTONIX) 40 MG tablet Take 40 mg by mouth daily.      . simvastatin (ZOCOR) 20 MG tablet Take 10 mg by mouth every evening. Pt cuts tab in half for 10 mg dose      . warfarin (COUMADIN) 5 MG tablet Take 2.5-5 mg by mouth See admin instructions. Pt takes 1/2 of 5 mg  Tablet for 2.5 mg dose on Monday,Wednesday,Friday,sunday. On Tuesday,Thursday, Saturday pt takes 5 mg.       Scheduled:   . antiseptic oral rinse  15 mL Mouth Rinse q12n4p  . aspirin  300 mg Rectal Once  . chlorhexidine  15 mL Mouth Rinse BID  . insulin aspart  0-9 Units Subcutaneous Q4H  . lip balm  1 application Topical BID  . magnesium sulfate 1 - 4 g bolus IVPB  1 g Intravenous Once  . metoprolol  5 mg Intravenous Q6H  . metronidazole  500 mg Intravenous Q6H  . pantoprazole (PROTONIX) IV  40 mg Intravenous Q24H  . piperacillin-tazobactam (ZOSYN)  IV  3.375 g Intravenous Q8H  . potassium chloride  10 mEq Intravenous Q1 Hr x 4  .  potassium chloride  10 mEq Intravenous Q1 Hr x 6  . potassium chloride  10 mEq Intravenous Q1 Hr x 6  . sodium chloride  10-40 mL Intracatheter Q12H  . vancomycin (VANCOCIN) rectal enema  500 mg Rectal Q6H  . vancomycin  1,000 mg Intravenous Q24H  . DISCONTD: magnesium sulfate LVP 250-500 ml  1 g Intravenous Once   Continuous:   . sodium chloride 75 mL/hr at 07/12/11 1639  . heparin 1,300 Units/hr (07/13/11 0600)  . TPN (CLINIMIX) +/- additives 60 mL/hr at 07/12/11 1813  . TPN (CLINIMIX) +/- additives 60 mL/hr at 07/13/11 0608  . TPN (CLINIMIX) +/- additives 60 mL/hr at 07/11/11 1745  . DISCONTD: TPN (CLINIMIX) +/- additives    . DISCONTD: TPN (CLINIMIX) +/- additives    . DISCONTD: TPN (CLINIMIX) +/- additives      Assessment/Plan: Status post lysis of adhesions, patient still with ileus, continue TPN. CVA, check carotid Doppler, 2-D echo, continue heparin, neurology Will be consult Status post repair of right hip fracture Anemia, additional transfusion may be indicated Hypokalemia, replete IV, check mag History of PE Resolved fecal impaction Acute on chronic kidney disease Status post GI bleed required reversal of anticoagulation, EGD without definitive source, previous colon showed diverticulosis. No recurrent bleeding.  LOS: 19 days   Rayhaan Huster D 07/13/2011, 8:43 AM

## 2011-07-13 NOTE — Progress Notes (Signed)
  Echocardiogram 2D Echocardiogram has been performed.  Laural Benes, [name] A 07/13/2011, 1:00 PM

## 2011-07-13 NOTE — Progress Notes (Signed)
ANTICOAGULATION CONSULT NOTE - Follow Up Consult  Pharmacy Consult for Heparin Indication: VTE and r/o CVA  No Known Allergies  Patient Measurements: Height: 5\' 6"  (167.6 cm) Weight: 153 lb 7 oz (69.6 kg) IBW/kg (Calculated) : 63.8   Vital Signs: Temp: 98.4 F (36.9 C) (03/11 2000) Temp src: Oral (03/11 2000) BP: 139/81 mmHg (03/11 1800) Pulse Rate: 138  (03/11 1800)  Labs:  Basename 07/13/11 1935 07/13/11 1320 07/13/11 0845 07/13/11 0425 07/12/11 2150 07/12/11 1655 07/12/11 1552 07/12/11 0520 07/11/11 1604 07/11/11 0425  HGB -- -- -- 8.2* -- -- -- 8.7* -- --  HCT -- -- -- 24.4* -- -- -- 25.9* 21.9* --  PLT -- -- -- 211 -- -- -- 226 235 --  APTT -- -- -- -- -- -- -- -- -- --  LABPROT -- -- -- 15.6* -- -- -- 15.8* -- 17.4*  INR -- -- -- 1.21 -- -- -- 1.23 -- 1.40  HEPARINUNFRC 0.24* -- 0.23* -- -- -- 0.30 -- -- --  CREATININE -- -- -- 1.26 -- 1.29 -- 1.44* -- --  CKTOTAL -- 28 -- 35 43 -- -- -- -- --  CKMB -- 1.8 -- 1.9 1.9 -- -- -- -- --  TROPONINI -- <0.30 -- <0.30 <0.30 -- -- -- -- --   Estimated Creatinine Clearance: 45.7 ml/min (by C-G formula based on Cr of 1.26).   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 Verified rate w/ RN. No interruptions or problems w/ IV site per RN  Goal of Therapy:  0.3-0.4    Plan:   Increase heparin infusion to 1600 units/hr  Re-check heparin level in 8 hours (AM level)  Daily heparin level, CBC  Gwen Her PharmD  [phone] 07/13/2011 9:07 PM

## 2011-07-13 NOTE — Progress Notes (Signed)
ANTICOAGULATION CONSULT NOTE - Follow Up Consult  Pharmacy Consult for Heparin Indication: PE, r/o CVA  No Known Allergies  Patient Measurements: Height: 5\' 6"  (167.6 cm) Weight: 153 lb 7 oz (69.6 kg) IBW/kg (Calculated) : 63.8  Heparin Dosing Weight:   Vital Signs: Temp: 98.3 F (36.8 C) (03/11 0000) Temp src: Oral (03/11 0000) BP: 137/68 mmHg (03/11 0000) Pulse Rate: 125  (03/11 0000)  Labs:  Alvira Philips 07/12/11 2150 07/12/11 1655 07/12/11 1552 07/12/11 0520 07/11/11 2343 07/11/11 1604 07/11/11 0425 07/10/11 0510  HGB -- -- -- 8.7* -- 7.3* -- --  HCT -- -- -- 25.9* -- 21.9* 26.6* --  PLT -- -- -- 226 -- 235 298 --  APTT -- -- -- -- -- -- -- --  LABPROT -- -- -- 15.8* -- -- 17.4* 15.4*  INR -- -- -- 1.23 -- -- 1.40 1.19  HEPARINUNFRC -- -- 0.30 0.27* 0.21* -- -- --  CREATININE -- 1.29 -- 1.44* -- -- 1.78* --  CKTOTAL 43 -- -- -- -- -- -- --  CKMB 1.9 -- -- -- -- -- -- --  TROPONINI <0.30 -- -- -- -- -- -- --   Estimated Creatinine Clearance: 44.6 ml/min (by C-G formula based on Cr of 1.29).   Medications:  Infusions:    . sodium chloride 75 mL/hr at 07/12/11 1639  . heparin 1,200 Units/hr (07/13/11 0000)  . TPN (CLINIMIX) +/- additives 60 mL/hr at 07/12/11 1813  . TPN (CLINIMIX) +/- additives    . TPN (CLINIMIX) +/- additives 60 mL/hr at 07/11/11 1745  . DISCONTD: TPN (CLINIMIX) +/- additives    . DISCONTD: TPN (CLINIMIX) +/- additives    . DISCONTD: TPN (CLINIMIX) +/- additives      Assessment: Patient with low heparin level.  No issues per RN. Goal of Therapy:  Heparin level 0.3-0.7 units/ml   Plan:  incr to 1300 units/hr, f/u with CVA results and heparin level at 0800.  Aleene Davidson Crowford 07/13/2011,1:05 AM

## 2011-07-14 ENCOUNTER — Inpatient Hospital Stay (HOSPITAL_COMMUNITY): Payer: Medicare HMO

## 2011-07-14 LAB — LIPID PANEL
Cholesterol: 54 mg/dL (ref 0–200)
HDL: 14 mg/dL — ABNORMAL LOW (ref >39)
Total CHOL/HDL Ratio: 3.9 RATIO

## 2011-07-14 LAB — BASIC METABOLIC PANEL WITH GFR
BUN: 14 mg/dL (ref 6–23)
BUN: 14 mg/dL (ref 6–23)
CO2: 21 meq/L (ref 19–32)
CO2: 21 meq/L (ref 19–32)
Calcium: 7.7 mg/dL — ABNORMAL LOW (ref 8.4–10.5)
Calcium: 7.7 mg/dL — ABNORMAL LOW (ref 8.4–10.5)
Chloride: 107 meq/L (ref 96–112)
Chloride: 108 meq/L (ref 96–112)
Creatinine, Ser: 1.15 mg/dL (ref 0.50–1.35)
Creatinine, Ser: 1.09 mg/dL (ref 0.50–1.35)
GFR calc Af Amer: 70 mL/min — ABNORMAL LOW
GFR calc Af Amer: 75 mL/min — ABNORMAL LOW
GFR calc non Af Amer: 60 mL/min — ABNORMAL LOW
GFR calc non Af Amer: 64 mL/min — ABNORMAL LOW
Glucose, Bld: 178 mg/dL — ABNORMAL HIGH (ref 70–99)
Glucose, Bld: 199 mg/dL — ABNORMAL HIGH (ref 70–99)
Potassium: 2.6 meq/L — CL (ref 3.5–5.1)
Potassium: 3.1 meq/L — ABNORMAL LOW (ref 3.5–5.1)
Sodium: 136 meq/L (ref 135–145)
Sodium: 136 meq/L (ref 135–145)

## 2011-07-14 LAB — GLUCOSE, CAPILLARY
Glucose-Capillary: 187 mg/dL — ABNORMAL HIGH (ref 70–99)
Glucose-Capillary: 189 mg/dL — ABNORMAL HIGH (ref 70–99)
Glucose-Capillary: 196 mg/dL — ABNORMAL HIGH (ref 70–99)
Glucose-Capillary: 200 mg/dL — ABNORMAL HIGH (ref 70–99)
Glucose-Capillary: 200 mg/dL — ABNORMAL HIGH (ref 70–99)

## 2011-07-14 LAB — PROTIME-INR
INR: 1.23 (ref 0.00–1.49)
Prothrombin Time: 15.8 seconds — ABNORMAL HIGH (ref 11.6–15.2)

## 2011-07-14 LAB — TYPE AND SCREEN
ABO/RH(D): A POS
Unit division: 0

## 2011-07-14 LAB — CBC
HCT: 27.4 % — ABNORMAL LOW (ref 39.0–52.0)
Hemoglobin: 9.2 g/dL — ABNORMAL LOW (ref 13.0–17.0)
MCHC: 33.6 g/dL (ref 30.0–36.0)
RBC: 3.28 MIL/uL — ABNORMAL LOW (ref 4.22–5.81)
WBC: 8.2 10*3/uL (ref 4.0–10.5)

## 2011-07-14 LAB — HEPARIN LEVEL (UNFRACTIONATED): Heparin Unfractionated: 0.43 [IU]/mL (ref 0.30–0.70)

## 2011-07-14 LAB — MAGNESIUM: Magnesium: 1.6 mg/dL (ref 1.5–2.5)

## 2011-07-14 MED ORDER — INSULIN REGULAR HUMAN 100 UNIT/ML IJ SOLN
INTRAVENOUS | Status: AC
  Administered 2011-07-14: 18:00:00 via INTRAVENOUS
  Filled 2011-07-14: qty 1800

## 2011-07-14 MED ORDER — POTASSIUM CHLORIDE 10 MEQ/50ML IV SOLN
10.0000 meq | INTRAVENOUS | Status: AC
  Administered 2011-07-14 (×6): 10 meq via INTRAVENOUS
  Filled 2011-07-14 (×9): qty 50

## 2011-07-14 MED ORDER — MAGNESIUM SULFATE IN D5W 10-5 MG/ML-% IV SOLN
1.0000 g | Freq: Once | INTRAVENOUS | Status: AC
  Administered 2011-07-14: 1 g via INTRAVENOUS
  Filled 2011-07-14: qty 100

## 2011-07-14 MED ORDER — POTASSIUM CHLORIDE 10 MEQ/50ML IV SOLN
10.0000 meq | INTRAVENOUS | Status: AC
  Administered 2011-07-14 (×3): 10 meq via INTRAVENOUS
  Filled 2011-07-14 (×3): qty 50

## 2011-07-14 MED ORDER — DILTIAZEM HCL 100 MG IV SOLR
5.0000 mg/h | INTRAVENOUS | Status: DC
  Filled 2011-07-14: qty 100

## 2011-07-14 MED ORDER — SODIUM PHOSPHATE 3 MMOLE/ML IV SOLN
10.0000 mmol | Freq: Once | INTRAVENOUS | Status: AC
  Administered 2011-07-14: 10 mmol via INTRAVENOUS
  Filled 2011-07-14: qty 3.33

## 2011-07-14 MED ORDER — SODIUM CHLORIDE 0.9 % IV SOLN: INTRAVENOUS | Status: DC

## 2011-07-14 MED ORDER — SODIUM CHLORIDE 0.9 % IV SOLN
INTRAVENOUS | Status: DC
  Administered 2011-07-14 – 2011-07-21 (×5): via INTRAVENOUS
  Filled 2011-07-14 (×16): qty 1000

## 2011-07-14 NOTE — Progress Notes (Signed)
ANTICOAGULATION CONSULT NOTE - Follow Up Consult  Pharmacy Consult for Heparin Indication: VTE and r/o CVA  No Known Allergies  Patient Measurements: Height: 5\' 6"  (167.6 cm) Weight: 153 lb 7 oz (69.6 kg) IBW/kg (Calculated) : 63.8   Vital Signs: Temp: 98.5 F (36.9 C) (03/12 2000) Temp src: Oral (03/12 2000) BP: 159/89 mmHg (03/12 1700) Pulse Rate: 131  (03/12 1700)  Labs:  Alvira Philips 07/14/11 1915 07/14/11 1449 07/14/11 0808 07/14/11 0432 07/13/11 1935 07/13/11 1320 07/13/11 0425 07/12/11 2150 07/12/11 0520  HGB -- -- -- 9.2* -- -- 8.2* -- --  HCT -- -- -- 27.4* -- -- 24.4* -- 25.9*  PLT -- -- -- 211 -- -- 211 -- 226  APTT -- -- -- -- -- -- -- -- --  LABPROT -- -- -- 15.8* -- -- 15.6* -- 15.8*  INR -- -- -- 1.23 -- -- 1.21 -- 1.23  HEPARINUNFRC 0.47 -- 0.43 -- 0.24* -- -- -- --  CREATININE -- 1.09 -- 1.15 -- -- 1.26 -- --  CKTOTAL -- -- -- -- -- 28 35 43 --  CKMB -- -- -- -- -- 1.8 1.9 1.9 --  TROPONINI -- -- -- -- -- <0.30 <0.30 <0.30 --   Estimated Creatinine Clearance: 52.8 ml/min (by C-G formula based on Cr of 1.09).   Medications:  Heparin Infusion at 1600 units/hr  Assessment:  78 yo M, admit on chronic warfarin for history of VTE, s/p ORIF on 2/22, currently on heparin drip for VTE, Afib, and CVA   Afib noted on 3/8  Anemia, Hgb 9.2 s/p 1 unit PRBC on 3/11 Heparin level therapeutic at 0.47  Goal of Therapy:  Heparin Level 0.3-0.5   Plan:   Continue heparin infusion at 1600 ml/hr  Daily heparin level, CBC   Terrilee Files, PharmD  07/14/2011 8:31 PM

## 2011-07-14 NOTE — Progress Notes (Addendum)
PARENTERAL NUTRITION CONSULT NOTE - FOLLOW UP  Pharmacy Consult for TNA Indication: SBO  No Known Allergies  Patient Measurements: Height: 5\' 6"  (167.6 cm) Weight: 153 lb 7 oz (69.6 kg) IBW/kg (Calculated) : 63.8  Usual Weight: unable to assess  Vital Signs: Temp: 98.4 F (36.9 C) (03/12 0800) Temp src: Oral (03/12 0800) BP: 156/79 mmHg (03/12 0600) Pulse Rate: 116  (03/12 0600)  Intake/Output from previous day: 03/11 0701 - 03/12 0700 In: 4790.5 [I.V.:1598; Blood:262.5; IV Piggyback:1250; TPN:1680] Out: 2615 [Urine:2565; Emesis/NG output:50] 3/11 Net +2.2 L  Labs:  Basename 07/14/11 0432 07/13/11 0425 07/12/11 0520  WBC 8.2 10.2 9.9  HGB 9.2* 8.2* 8.7*  HCT 27.4* 24.4* 25.9*  PLT 211 211 226  APTT -- -- --  INR -- 1.21 1.23     Basename 07/14/11 0433 07/14/11 0432 07/13/11 0425 07/12/11 1655  NA -- 136 137 138  K -- 2.6* 2.9* 2.7*  CL -- 107 109 109  CO2 -- 21 21 21   GLUCOSE -- 178* 189* 195*  BUN -- 14 14 16   CREATININE -- 1.15 1.26 1.29  LABCREA -- -- -- --  CREAT24HRUR -- -- -- --  CALCIUM -- 7.7* 7.6* 7.6*  MG -- -- 1.8 1.7  PHOS -- -- 2.1* --  PROT -- -- 4.6* --  ALBUMIN -- -- 1.2* --  AST -- -- 13 --  ALT -- -- 12 --  ALKPHOS -- -- 68 --  BILITOT -- -- 0.3 --  BILIDIR -- -- -- --  IBILI -- -- -- --  PREALBUMIN -- -- <3.0* --  TRIG 61 -- 125 --  CHOLHDL 3.9 -- -- --  CHOL 54 -- 64 --  3/12 Corrected calcium = 9.9 Estimated Creatinine Clearance: 50.1 ml/min (by C-G formula based on Cr of 1.15).   3/11 CBGs: 146, 151, 174, 174, 163  ( 9 units SSI required/24hr)  Medications:  Scheduled:     . antiseptic oral rinse  15 mL Mouth Rinse q12n4p  . chlorhexidine  15 mL Mouth Rinse BID  . insulin aspart  0-9 Units Subcutaneous Q4H  . lip balm  1 application Topical BID  . metoprolol  5 mg Intravenous Q6H  . metronidazole  500 mg Intravenous Q6H  . pantoprazole (PROTONIX) IV  40 mg Intravenous Q24H  . piperacillin-tazobactam (ZOSYN)  IV   3.375 g Intravenous Q8H  . potassium chloride  10 mEq Intravenous Q1 Hr x 6  . potassium chloride  10 mEq Intravenous Q1 Hr x 6  . sodium chloride  10-40 mL Intracatheter Q12H  . vancomycin  1,000 mg Intravenous Q24H   Infusions:  NS at [name] ml/hr, Heparin 1600 units/hr  Current Nutrition:  Clinimix E5/20 at [name] ml/hr  NPO  Assessment:  [name] s/p ORIF 2/22, GI bleed 2/28 thought to be 2/2 diverticular disease, CT 3/4 consistent with SBO. NG tube with significant output, plan to start TNA. Last meal recorded 2/28.  POD#4 LOA, serosal/enterotomy repair, fecal disimpaction  Tolerating TNA at goal rate of [name] ml/hr  Potassium remains low and has decreased, despite KCl 60 mEq on 3/11 per MD; KCl 60 mEq IV ordered per MD on 3/12.    Magnesium at low end of normal range (1.8 on 3/11, 1.6 on 3/12)  Phosphorus slightly below normal (2.1 on 3/11)   Baseline prealbumin 5.4, on 3/11 it was < 3; Lipids have remained normal  CBGs improved with addition of insulin to TNA  Scr elevated post op,  but improving daily  Nutritional Goals:   1850-2110 kCal, 79-92 grams of protein per day  Clinimix E5/20 36ml/hr + 20% Lipids 69ml/hr will provide 90gm protein, 1584 kcal on non-lipid days, 2064 kcal on lipid days, average 1790 kcal/week  Plan:   Continue Clinimix E5/20 at goal rate of [name] ml/hr Fat emulsion 10 ml/hr (MWF only due to ongoing shortage). Standard multivitamins and trace elements (MWF only due to ongoing shortage). Magnesium sulfate 1g IV x1 dose Sodium Phosphate 10 mMol IVPB x1 dose IVF per MD Maintain 10 units insulin per liter bag of TNA; Continue CBG/SSI q4h Follow up CBGs, potassium, magnesium, and phosphate levels TNA lab panels on Mondays & Thursdays.   Lynann Beaver PharmD, BCPS Pager [phone] 07/14/2011 8:48 AM

## 2011-07-14 NOTE — Progress Notes (Signed)
Nutrition Follow-up  Diet Order:  NPO  Pt continues TPN, Clinimix 5/20 @ 75 mL/hr with IV lipids @10  mL MWF for 1789 kcal, 90g protein. PharmD added insulin to TPN to improve glucose control.  CBG (last 3)   Basename 07/14/11 1109 07/14/11 0745 07/14/11 0326  GLUCAP 206* 187* 200*   NGT remains in place.  Abdomen distended. Pt with several loose stools.  C. Diff positive (3/10).  Ileus continues per surgery (3/11).  Pt with L sided weakness (3/9).  Acute CVA noted by MD (3/11).  Pt with decreased grip strength 3/9-3/11, much improved today.  R hand grip strong, L improved to moderate. Pt to start PT/OT.  Meds: Scheduled Meds:   . antiseptic oral rinse  15 mL Mouth Rinse q12n4p  . chlorhexidine  15 mL Mouth Rinse BID  . insulin aspart  0-9 Units Subcutaneous Q4H  . lip balm  1 application Topical BID  . magnesium sulfate 1 - 4 g bolus IVPB  1 g Intravenous Once  . metoprolol  5 mg Intravenous Q6H  . metronidazole  500 mg Intravenous Q6H  . pantoprazole (PROTONIX) IV  40 mg Intravenous Q24H  . piperacillin-tazobactam (ZOSYN)  IV  3.375 g Intravenous Q8H  . potassium chloride  10 mEq Intravenous Q1 Hr x 6  . potassium chloride  10 mEq Intravenous Q1 Hr x 6  . sodium chloride  10-40 mL Intracatheter Q12H  . sodium phosphate  Dextrose 5% IVPB  10 mmol Intravenous Once  . vancomycin  1,000 mg Intravenous Q24H   Continuous Infusions:   . TPN (CLINIMIX) +/- additives 75 mL/hr at 07/13/11 1825   And  . fat emulsion 250 mL (07/13/11 1824)  . heparin 1,600 Units/hr (07/14/11 0711)  . 0.9 % sodium chloride with kcl    . TPN (CLINIMIX) +/- additives 60 mL/hr at 07/13/11 1000  . TPN (CLINIMIX) +/- additives    . DISCONTD: sodium chloride 75 mL/hr at 07/12/11 1639  . DISCONTD: sodium chloride    . DISCONTD: diltiazem (CARDIZEM) infusion     PRN Meds:.acetaminophen (TYLENOL) oral liquid 160 mg/5 mL, fentaNYL, iohexol, metoCLOPramide (REGLAN) injection, sodium chloride  Labs:  CMP     Component Value Date/Time   NA 136 07/14/2011 0432   K 2.6* 07/14/2011 0432   CL 107 07/14/2011 0432   CO2 21 07/14/2011 0432   GLUCOSE 178* 07/14/2011 0432   BUN 14 07/14/2011 0432   CREATININE 1.15 07/14/2011 0432   CALCIUM 7.7* 07/14/2011 0432   PROT 4.6* 07/13/2011 0425   ALBUMIN 1.2* 07/13/2011 0425   AST 13 07/13/2011 0425   ALT 12 07/13/2011 0425   ALKPHOS 68 07/13/2011 0425   BILITOT 0.3 07/13/2011 0425   GFRNONAA 60* 07/14/2011 0432   GFRAA 70* 07/14/2011 0432     Intake/Output Summary (Last 24 hours) at 07/14/11 1135 Last data filed at 07/14/11 0600  Gross per 24 hour  Intake 3893.93 ml  Output   2140 ml  Net 1753.93 ml    Weight Status:  Trend increasing, Admission wt: 134 lbs Current wt: 153 lbs  Restatement of needs:  1850-2110 kcal, 79-92g protein  Nutrition Dx:  Inadequate oral intake, ongoing  Intervention:  1.  Parenteral nutrition; continue per Pharm D. Pt currently meeting 96% estimated kcal needs and 100% protein needs. 2.  Enteral nutrition; Pt with NGT.  Abdomen remains distended, bowels moving, ileus persists per CCS (3/11).  Pt oriented.  Unsure if swallow eval needed prior to diet advancement  when ready.  RD will provide enteral recommendations if needed.  Monitor:   1.  Parenteral nutrition; continued tolerance with pt meeting 100% of estimated needs.  2.  Food/Beverage; tolerance if PO diet resume when ileus resolved.  Hoyt Koch Pager #:  [phone]

## 2011-07-14 NOTE — Progress Notes (Signed)
eLink Physician-Brief Progress Note Patient Name: [name] DOB: Feb 25, 1934 MRN: [id number]  Date of Service  07/14/2011   HPI/Events of Note     eICU Interventions  Hypokalemia -repleted    Intervention Category Minor Interventions: Electrolytes abnormality - evaluation and management  Million Maharaj V. 07/14/2011, 4:41 PM

## 2011-07-14 NOTE — Evaluation (Signed)
Physical Therapy Re-Evaluation Patient Details Name: [name] MRN: [id number] DOB: 17-Sep-1933 Today's Date: 07/14/2011  Problem List:  Patient Active Problem List  Diagnoses  . PE (pulmonary embolism)  . Chronic anticoagulation  . Fall from standing  . Fracture of hip, right, closed  . Peripheral arterial disease  . SBO (small bowel obstruction)  . Fecal impaction in rectum  . Chronic constipation  . Acute respiratory failure with hypoxia  . Tachycardia  . A-fib    Past Medical History:  Past Medical History  Diagnosis Date  . Asthma   . Hypertension   . Pulmonary embolism   . Peripheral artery disease   . Hypercholesteremia   . Bursitis of right shoulder   . DVT of leg (deep venous thrombosis)    Past Surgical History:  Past Surgical History  Procedure Date  . Hernia repair   . Appendectomy   . Femoral-femoral bypass graft   . Iliac artery stent   . Esophagogastroduodenoscopy 07/03/2011    Procedure: ESOPHAGOGASTRODUODENOSCOPY (EGD);  Surgeon: Shirley Friar, MD;  Location: [facility];  Service: Endoscopy;  Laterality: N/A;  . Compression hip screw 06/26/2011    Procedure: COMPRESSION HIP;  Surgeon: Raymon Mutton, MD;  Location: [facility];  Service: Orthopedics;  Laterality: Right;    PT Assessment/Plan/Recommendation PT Assessment Clinical Impression Statement: 78 YO male w/ history of PE, and CRI admitted on 2/20 s/p a fall resulting in right hip fracture which was repaired by Ortho on 2/22 s/p ORIF. Post-op he developed GIB and bowel obstruction. He was followed conservatively, but ultimately went for Exploratory lap on 3/8, he underwent lysis of adhesion. New L hemiparesis and apparent CVA. Educated pt and family (wife, granddaughter) on ROM exercises for UE/LE and issued handout. Explained that family and nursing(present during re-eval) can perform ROM (especially passive) 1-2 times per day, as tolerated. PT will perform exercises with patient during PT  sessions, as able. Pt will benefit from skilled PT in acute setting to improve activity tolerance, strength, and balance for progression of functional mobility (bed mobility, transfers) in preparation for continued rehab at discharge.  PT Recommendation/Assessment: Patient will need skilled PT in the acute care venue PT Problem List: Decreased strength;Decreased activity tolerance;Decreased balance;Decreased mobility;Decreased coordination;Decreased knowledge of use of DME;Pain PT Therapy Diagnosis : Hemiplegia non-dominant side;Acute pain;Generalized weakness;Difficulty walking PT Plan PT Frequency: Min 3X/week PT Treatment/Interventions: Functional mobility training;Therapeutic activities;Therapeutic exercise;Balance training;Patient/family education;DME instruction;Neuromuscular re-education PT Recommendation Recommendations for Other Services: Rehab consult; OT consult Follow Up Recommendations: Inpatient Rehab; (LTACH or SNF if CIR not an option) Equipment Recommended: Defer to next venue PT Goals  Acute Rehab PT Goals PT Goal Formulation: With patient Pt will Roll Supine to Right Side: with mod assist;with rail PT Goal: Rolling Supine to Right Side - Progress: Goal set today Pt will Roll Supine to Left Side: with mod assist;with rail PT Goal: Rolling Supine to Left Side - Progress: Goal set today Pt will go Supine/Side to Sit: with mod assist;with rail PT Goal: Supine/Side to Sit - Progress: Goal set today Pt will Sit at Edge of Bed: 6-10 min;with min assist;with unilateral upper extremity support;with cues (comment type and amount) PT Goal: Sit at Edge Of Bed - Progress: Goal set today Pt will Transfer Bed to Chair/Chair to Bed: with +2 total assist (Pt=50%) PT Transfer Goal: Bed to Chair/Chair to Bed - Progress: Goal set today Pt will Perform Home Exercise Program: with min assist with therapy and family PT Goal: Perform Home  Exercise Program - Progress: Goal set today  PT  Evaluation Precautions/Restrictions  Precautions Precautions: Fall Restrictions Weight Bearing Restrictions: No RLE Weight Bearing: Weight bearing as tolerated Prior Functioning  Home Living Lives With: Spouse Type of Home: House Home Layout: Two level Alternate Level Stairs-Rails: Right Alternate Level Stairs-Number of Steps: 7 Home Access: Level entry Bathroom Shower/Tub: Tub/shower unit Home Adaptive Equipment: None Prior Function Level of Independence: Independent with basic ADLs;Independent with gait;Independent with homemaking with ambulation;Independent with transfers (Requiring assist since recent admission for ORIF) Cognition Cognition Arousal/Alertness: Awake/alert Overall Cognitive Status: Appears within functional limits for tasks assessed Orientation Level: Oriented X4 Sensation/Coordination Sensation Light Touch: Appears Intact Coordination Gross Motor Movements are Fluid and Coordinated: No Coordination and Movement Description: nearly flaccid L UE and L LE-some movement (elbow flexion, DF/PF) Extremity Assessment  LUE Overall Strength Comments: No functional use of L UE. Elbow flex 2/5.  RLE Assessment RLE Assessment: Exceptions to [facility] RLE AROM (degrees) Overall AROM Right Lower Extremity: Deficits RLE Overall Strength: Deficits RLE Overall Strength Comments: Hip flex 2/5 hip abd/add 2/5, knee ext-NT, DF/PF at least 2/5. LLE Assessment: Exceptions to [facility] LLE PROM (degrees) Overall PROM Left Lower Extremity: Within functional limits for tasks assessed LLE Overall Strength Comments: PF 2/5, DF 1/5, hip 0/5 thoughout, knee 0/5 throughout Mobility (including Balance) Bed Mobility Bed Mobility: Yes Rolling Right: 1: +2 Total assist Rolling Right Details (indicate cue type and reason): Pt=10%. Utilized bedpad for assistance. Multi-modal cues for encouragment, initiation of task, technique, head positioning. Rolling Left: 1: +2 Total assist Rolling Left  Details (indicate cue type and reason): Pt=25%. Utilized bedpad for assistance. Multi-modal cues for encouragement, initiation of task, technique, head positioning. Supine to Sit: 1: +2 Total assist;HOB elevated (Comment degrees) Supine to Sit Details (indicate cue type and reason): HOB 60 degrees. Multimodal cues for encouragment, initiation of task, technique. Assist for trunk to upright and bil LEs off EOB. Increased time. Pt c/o pain R hip and abdomen. Attempted side to sit but pt unable to complete full roll to L side.  Transfers Transfers: No (utilized [employer] for [employer]) Ambulation/Gait Ambulation/Gait: No (pt unable)  Posture/Postural Control Posture/Postural Control: Postural limitations Postural Limitations: Pt unable to sit unassisted EOB. Flexed head/neck/trunk in sitting. Rotation of head in supine to R side at rest.  Balance Balance Assessed: Yes Static Sitting Balance Static Sitting - Balance Support: Right upper extremity supported;Feet supported Static Sitting - Level of Assistance: 1: +2 Total assist Static Sitting - Comment/# of Minutes: Pt=60%. LOB posteriorly and to L side repeatedly. Sat EOB 5 minutes-pt fatigues very easily. Had pt reach anteriorly to hold onto therapist's shoulder with R UE-Mod assist but only able to maintain for 8-10 seconds before fatigued and LOB. Exercise  General Exercises - Upper Extremity Shoulder Flexion: PROM;AROM;Both;10 reps;Supine (PROM L UE) Elbow Flexion: AROM;AAROM;Both;10 reps;Supine (AAROM L UE) Elbow Extension: AROM;Both;10 reps;Supine;PROM (PROM L UE) General Exercises - Lower Extremity Ankle Circles/Pumps: AAROM;Both;10 reps;Supine Quad Sets: AROM;Right;10 reps;Supine Heel Slides: PROM;AAROM;Both;10 reps;Supine (PROM L LE) Hip ABduction/ADduction: PROM;AAROM;Both;10 reps;Supine (PROM L LE) Other Exercises Other Exercises: supine 90-90 (hip, knee) IR/ER 5 reps L LE Other Exercises: hand flex/ext fingers- AROM R UE, PROM L  UE, 10 reps supine End of Session PT - End of Session Activity Tolerance: Patient limited by fatigue;Patient limited by pain Patient left: in chair;with call bell in reach;with family/visitor present Nurse Communication: Need for lift equipment General Behavior During Session: [facility] for tasks performed Cognition: [facility] for tasks performed  Rebeca Alert [facility] 07/14/2011, 4:41 PM [phone]

## 2011-07-14 NOTE — Progress Notes (Signed)
[facility] PROGRESS NOTE    SUBJECTIVE   No complaints, states he slept well last night. No further weakness  OBJECTIVE   Vital signs in last 24 hours: Temp:  [97.8 F (36.6 C)-98.9 F (37.2 C)] 98.4 F (36.9 C) (03/12 0800) Pulse Rate:  [116-141] 116  (03/12 0600) Resp:  [22-29] 23  (03/12 0600) BP: (129-174)/(71-102) 156/79 mmHg (03/12 0600) SpO2:  [97 %-100 %] 98 % (03/12 0600)  Intake/Output from previous day: 03/11 0701 - 03/12 0700 In: 4790.5 [I.V.:1598; Blood:262.5; IV Piggyback:1250; TPN:1680] Out: 2615 [Urine:2565; Emesis/NG output:50] Intake/Output this shift:   Nutritional status: NPO  Past Medical History  Diagnosis Date  . Asthma   . Hypertension   . Pulmonary embolism   . Peripheral artery disease   . Hypercholesteremia   . Bursitis of right shoulder   . DVT of leg (deep venous thrombosis)     Neurologic Exam:   Mental Status:  Alert, oriented, thought content appropriate. Speech fluent without evidence of aphasia. Able to follow 3 step commands without difficulty.  Cranial Nerves:  II: visual fields grossly normal, pupils equal, round, reactive to light and accommodation  III,IV, VI: ptosis not present, extraocular muscles extra-ocular motions intact bilaterally  V,VII: smile symmetric, facial light touch sensation normal bilaterally  VIII: hearing normal bilaterally  IX,X: gag reflex present  XI: trapezius strength/neck flexion strength normal bilaterally  XII: tongue strength normal   Motor: Right : Upper extremity    Left:     Upper extremity 4/5 deltoid       0/5 deltoid 4/5 tricep      0/5 tricep 4/5 biceps      0/5 biceps  4/5wrist flexion     1/5 wrist flexion 4/5 wrist extension     1/5 wrist extension 4/5 hand grip      3/5 hand grip  Lower extremity     Lower extremity 4/5 hip flexor      0/5 hip flexor 5/5 hip adductors     0/5 hip adductors 5/5 hip abductors     0/5 hip  abductors 4/5 quadricep      0/5 quadriceps  4/5 hamstrings     0/5 hamstrings 5/5 plantar flexion       2/5 plantar flexion 5/5 plantar extension     2/5 plantar extension Tone and bulk:normal tone throughout; no atrophy noted Sensory: Pinprick and light touch intact throughout, bilaterally Deep Tendon Reflexes: 2+ and symmetric throughout Plantars: Right: downgoing   Left: upgoing Cerebellar: normal finger-to-nose,   Lab Results: Lab Results  Component Value Date/Time   CHOL 54 07/14/2011  4:33 AM   Lipid Panel  Basename 07/14/11 0433  CHOL 54  TRIG 61  HDL 14*  CHOLHDL 3.9  VLDL 12  LDLCALC 28   HbA1C-- 6.4  Studies/Results: Ct Angio Head W/cm &/or Wo Cm  07/14/2011  *RADIOLOGY REPORT*  Clinical Data:  Left-sided weakness.  Perioperative incident with decreased oxygenation.  Atrial fibrillation.  Hypertension.  CT ANGIOGRAPHY HEAD AND NECK  Technique:  Multidetector CT imaging of the head and neck was performed using the standard protocol during bolus administration of intravenous contrast.  Multiplanar CT image reconstructions including MIPs were obtained to evaluate the vascular anatomy. Carotid stenosis measurements (when applicable) are obtained  utilizing NASCET criteria, using the distal internal carotid diameter as the denominator.  Contrast: OMNIPAQUE IOHEXOL 350 MG/ML IV SOLN  Comparison:   None.  CTA NECK  Findings:  Conventional branching of the great vessels from the arch.  No proximal stenosis.  Calcific plaque surrounds both carotid bifurcations.  The right internal carotid artery is occluded without significant reconstitution in the neck.  The left internal carotid artery shows minimal nonstenotic plaque at its origin.  Both vertebrals are patent with the left dominant. There is no significant vertebral ostial stenosis.  Nasogastric tube.  No neck masses.  Asymmetric left laryngeal densities just underneath the left thyroid cartilage may reflect previous surgery.   Mild chronic sinus disease, worst in the right maxillary regions. Lung apices show scarring and air cyst formation, greater on the right.   Review of the MIP images confirms the above findings.  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  CTA HEAD  Findings:  Collaterals reconstitute the right anterior circulation intracranially via the circle of Willis with slight retrograde filling of the right cavernous segment. There is an calcific nonstenotic disease affecting the cavernous segment of the left internal carotid artery. No visible ACA, MCA, or PCA stenosis. Large right cerebellar-pontine venous angioma.  Basilar artery widely patent with left greater than right vertebrals contributing.  There is cerebral, cerebellar, and brainstem atrophy with chronic microvascular ischemic change.  Remote right frontal infarct.  No visible acute stroke or intracranial hemorrhage.   Review of the MIP images confirms the above findings.  IMPRESSION: Collateral flow from the circle of Willis reconstitutes the right anterior circulation.  No visible proximal intracranial stenosis.  Original Report Authenticated By: Elsie Stain, M.D.   Ct Head Wo Contrast  07/13/2011  *RADIOLOGY REPORT*  Clinical Data: Weakness.  Evaluate for CVA  CT HEAD WITHOUT CONTRAST  Technique:  Contiguous axial images were obtained from the base of the skull through the vertex without contrast.  Comparison: The 07/11/2011  Findings: Right frontal lobe encephalomalacia is identified and appears unchanged from previous exam compatible with prior infarct.  There is diffuse patchy low density throughout the subcortical and periventricular white matter consistent with chronic small vessel ischemic change.  There is prominence of the sulci and ventricles consistent with brain atrophy.  There is no evidence for acute brain infarct, hemorrhage or mass.  The mastoid air cells are clear.  There is chronic asymmetric  opacification of the right maxillary sinus.  The skull appears intact.  IMPRESSION:  1.  Small vessel ischemic disease and brain atrophy. 2.  Chronic right frontal lobe infarct.  Original Report Authenticated By: Rosealee Albee, M.D.   Ct Angio Neck W/cm &/or Wo/cm  07/14/2011  *RADIOLOGY REPORT*  Clinical Data:  Left-sided weakness.  Perioperative incident with decreased oxygenation.  Atrial fibrillation.  Hypertension.  CT ANGIOGRAPHY HEAD AND NECK  Technique:  Multidetector CT imaging of the head and neck was performed using the standard protocol during bolus administration of intravenous contrast.  Multiplanar CT image reconstructions including MIPs were obtained to evaluate the vascular anatomy. Carotid stenosis measurements (when applicable) are obtained utilizing NASCET criteria, using the distal internal carotid diameter as the denominator.  Contrast: OMNIPAQUE IOHEXOL 350 MG/ML IV SOLN  Comparison:   None.  CTA NECK  Findings:  Conventional branching of the great vessels from the arch.  No proximal stenosis.  Calcific plaque surrounds both carotid bifurcations.  The right internal carotid artery is  occluded without significant reconstitution in the neck.  The left internal carotid artery shows minimal nonstenotic plaque at its origin.  Both vertebrals are patent with the left dominant. There is no significant vertebral ostial stenosis.  Nasogastric tube.  No neck masses.  Asymmetric left laryngeal densities just underneath the left thyroid cartilage may reflect previous surgery.  Mild chronic sinus disease, worst in the right maxillary regions. Lung apices show scarring and air cyst formation, greater on the right.   Review of the MIP images confirms the above findings.  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  CTA HEAD  Findings:  Collaterals reconstitute the right anterior circulation intracranially via the circle of Willis with slight retrograde  filling of the right cavernous segment. There is an calcific nonstenotic disease affecting the cavernous segment of the left internal carotid artery. No visible ACA, MCA, or PCA stenosis. Large right cerebellar-pontine venous angioma.  Basilar artery widely patent with left greater than right vertebrals contributing.  There is cerebral, cerebellar, and brainstem atrophy with chronic microvascular ischemic change.  Remote right frontal infarct.  No visible acute stroke or intracranial hemorrhage.   Review of the MIP images confirms the above findings.  IMPRESSION: Collateral flow from the circle of Willis reconstitutes the right anterior circulation.  No visible proximal intracranial stenosis.  Original Report Authenticated By: Elsie Stain, M.D.   Dg Chest Port 1 View  07/14/2011  *RADIOLOGY REPORT*  Clinical Data: Respiratory distress. History of pulmonary embolism.  Asthma.  Hypertension.  PORTABLE CHEST - 1 VIEW  Comparison: 07/12/2011  Findings: Nasogastric extends beyond the  inferior aspect of the film.  Right-sided PICC line unchanged, tip at low SVC.  Normal heart size.  Right greater than left bilateral pleural effusion without significant change.  No pneumothorax.  Mild pulmonary venous congestion, accentuated by low lung volumes. Right greater than left bibasilar airspace disease is unchanged.  IMPRESSION:  1. No significant change since the prior exam. 2.  Mild pulmonary venous congestion and bilateral pleural effusions and bibasilar airspace disease, likely atelectasis.  Original Report Authenticated By: Consuello Bossier, M.D.   2 D echo results PENDING  Medications:     Scheduled:   . antiseptic oral rinse  15 mL Mouth Rinse q12n4p  . chlorhexidine  15 mL Mouth Rinse BID  . insulin aspart  0-9 Units Subcutaneous Q4H  . lip balm  1 application Topical BID  . magnesium sulfate 1 - 4 g bolus IVPB  1 g Intravenous Once  . metoprolol  5 mg Intravenous Q6H  . metronidazole  500 mg  Intravenous Q6H  . pantoprazole (PROTONIX) IV  40 mg Intravenous Q24H  . piperacillin-tazobactam (ZOSYN)  IV  3.375 g Intravenous Q8H  . potassium chloride  10 mEq Intravenous Q1 Hr x 6  . potassium chloride  10 mEq Intravenous Q1 Hr x 6  . sodium chloride  10-40 mL Intracatheter Q12H  . vancomycin  1,000 mg Intravenous Q24H    Assessment/Plan:   78 YO male S/p right hip ORIF, acute on chronic kidney disease, malnutrition, recent GI bleed with AC held but placed back on heparin due to new CVA. There is noted A-Fib on 3/8 currently on telemetry currently sinus tachycardia.  Recommend:  1) CT angio head and neck --Showed Right ICA occlusion -could be chronic 2) 2-D echo --Pending 3) FLP and HBA1C (6.8) --LDL 28  4) PT  5) cardiology consult to further evaluate A-fib.  6)  agree with continue Heparin  7) continue to treat underlying medical issues.    At this time we patient is on Heparin followed by pharmacy.  Neurology will follow up on a PRN basis.  Please call if you have any questions or concerns.    Felicie Morn PA-C [facility] [phone]  07/14/2011, 9:07 AM

## 2011-07-14 NOTE — Progress Notes (Signed)
Subjective: No events overnight, neurology input noted. Per nursing patient continues to have loose stool, NG tube a slightly distended abdomen.  Objective: Vital signs in last 24 hours: Temp:  [97.8 F (36.6 C)-98.9 F (37.2 C)] 98.4 F (36.9 C) (03/12 0800) Pulse Rate:  [116-141] 116  (03/12 0600) Resp:  [22-29] 23  (03/12 0600) BP: (129-171)/(71-102) 156/79 mmHg (03/12 0600) SpO2:  [97 %-100 %] 98 % (03/12 0600) Weight change:  Last BM Date: 07/13/11  Intake/Output from previous day: 03/11 0701 - 03/12 0700 In: 4790.5 [I.V.:1598; Blood:262.5; IV Piggyback:1250; TPN:1680] Out: 2615 [Urine:2565; Emesis/NG output:50] Intake/Output this shift:    General appearance: alert and cooperative Resp: clear to auscultation bilaterally Cardio: tachycardic but regular GI: distended, decreased bowel sounds Extremities: unchanged  Lab Results:  Results for orders placed during the hospital encounter of 06/24/11 (from the past 24 hour(s))  GLUCOSE, CAPILLARY     Status: Abnormal   Collection Time   07/13/11 11:25 AM      Component Value Range   Glucose-Capillary 174 (*) 70 - 99 (mg/dL)  CARDIAC PANEL(CRET KIN+CKTOT+MB+TROPI)     Status: Normal   Collection Time   07/13/11  1:20 PM      Component Value Range   Total CK 28  7 - 232 (U/L)   CK, MB 1.8  0.3 - 4.0 (ng/mL)   Troponin I <0.30  <0.30 (ng/mL)   Relative Index RELATIVE INDEX IS INVALID  0.0 - 2.5   GLUCOSE, CAPILLARY     Status: Abnormal   Collection Time   07/13/11  3:37 PM      Component Value Range   Glucose-Capillary 174 (*) 70 - 99 (mg/dL)  HEPARIN LEVEL (UNFRACTIONATED)     Status: Abnormal   Collection Time   07/13/11  7:35 PM      Component Value Range   Heparin Unfractionated 0.24 (*) 0.30 - 0.70 (IU/mL)  GLUCOSE, CAPILLARY     Status: Abnormal   Collection Time   07/13/11  7:51 PM      Component Value Range   Glucose-Capillary 163 (*) 70 - 99 (mg/dL)   Comment 1 Documented in Chart     Comment 2 Notify RN      HEMOGLOBIN A1C     Status: Abnormal   Collection Time   07/13/11  8:02 PM      Component Value Range   Hemoglobin A1C 6.4 (*) <5.7 (%)   Mean Plasma Glucose 137 (*) <117 (mg/dL)  GLUCOSE, CAPILLARY     Status: Abnormal   Collection Time   07/14/11 12:06 AM      Component Value Range   Glucose-Capillary 189 (*) 70 - 99 (mg/dL)  GLUCOSE, CAPILLARY     Status: Abnormal   Collection Time   07/14/11  3:26 AM      Component Value Range   Glucose-Capillary 200 (*) 70 - 99 (mg/dL)  PROTIME-INR     Status: Abnormal   Collection Time   07/14/11  4:32 AM      Component Value Range   Prothrombin Time 15.8 (*) 11.6 - 15.2 (seconds)   INR 1.23  0.00 - 1.49   CBC     Status: Abnormal   Collection Time   07/14/11  4:32 AM      Component Value Range   WBC 8.2  4.0 - 10.5 (K/uL)   RBC 3.28 (*) 4.22 - 5.81 (MIL/uL)   Hemoglobin 9.2 (*) 13.0 - 17.0 (g/dL)   HCT 14.7 (*)  39.0 - 52.0 (%)   MCV 83.5  78.0 - 100.0 (fL)   MCH 28.0  26.0 - 34.0 (pg)   MCHC 33.6  30.0 - 36.0 (g/dL)   RDW 29.5 (*) 28.4 - 15.5 (%)   Platelets 211  150 - 400 (K/uL)  BASIC METABOLIC PANEL     Status: Abnormal   Collection Time   07/14/11  4:32 AM      Component Value Range   Sodium 136  135 - 145 (mEq/L)   Potassium 2.6 (*) 3.5 - 5.1 (mEq/L)   Chloride 107  96 - 112 (mEq/L)   CO2 21  19 - 32 (mEq/L)   Glucose, Bld 178 (*) 70 - 99 (mg/dL)   BUN 14  6 - 23 (mg/dL)   Creatinine, Ser 1.32  0.50 - 1.35 (mg/dL)   Calcium 7.7 (*) 8.4 - 10.5 (mg/dL)   GFR calc non Af Amer 60 (*) >90 (mL/min)   GFR calc Af Amer 70 (*) >90 (mL/min)  LIPID PANEL     Status: Abnormal   Collection Time   07/14/11  4:33 AM      Component Value Range   Cholesterol 54  0 - 200 (mg/dL)   Triglycerides 61  <440 (mg/dL)   HDL 14 (*) >10 (mg/dL)   Total CHOL/HDL Ratio 3.9     VLDL 12  0 - 40 (mg/dL)   LDL Cholesterol 28  0 - 99 (mg/dL)  GLUCOSE, CAPILLARY     Status: Abnormal   Collection Time   07/14/11  7:45 AM      Component Value Range    Glucose-Capillary 187 (*) 70 - 99 (mg/dL)   Comment 1 Documented in Chart     Comment 2 Notify RN    MAGNESIUM     Status: Normal   Collection Time   07/14/11  8:07 AM      Component Value Range   Magnesium 1.6  1.5 - 2.5 (mg/dL)  HEPARIN LEVEL (UNFRACTIONATED)     Status: Normal   Collection Time   07/14/11  8:08 AM      Component Value Range   Heparin Unfractionated 0.43  0.30 - 0.70 (IU/mL)      Studies/Results: Ct Angio Head W/cm &/or Wo Cm  07/14/2011  *RADIOLOGY REPORT*  Clinical Data:  Left-sided weakness.  Perioperative incident with decreased oxygenation.  Atrial fibrillation.  Hypertension.  CT ANGIOGRAPHY HEAD AND NECK  Technique:  Multidetector CT imaging of the head and neck was performed using the standard protocol during bolus administration of intravenous contrast.  Multiplanar CT image reconstructions including MIPs were obtained to evaluate the vascular anatomy. Carotid stenosis measurements (when applicable) are obtained utilizing NASCET criteria, using the distal internal carotid diameter as the denominator.  Contrast: OMNIPAQUE IOHEXOL 350 MG/ML IV SOLN  Comparison:   None.  CTA NECK  Findings:  Conventional branching of the great vessels from the arch.  No proximal stenosis.  Calcific plaque surrounds both carotid bifurcations.  The right internal carotid artery is occluded without significant reconstitution in the neck.  The left internal carotid artery shows minimal nonstenotic plaque at its origin.  Both vertebrals are patent with the left dominant. There is no significant vertebral ostial stenosis.  Nasogastric tube.  No neck masses.  Asymmetric left laryngeal densities just underneath the left thyroid cartilage may reflect previous surgery.  Mild chronic sinus disease, worst in the right maxillary regions. Lung apices show scarring and air cyst formation, greater on the  right.   Review of the MIP images confirms the above findings.  IMPRESSION: Occluded right  internal carotid artery; this may be a chronic finding.  CTA HEAD  Findings:  Collaterals reconstitute the right anterior circulation intracranially via the circle of Willis with slight retrograde filling of the right cavernous segment. There is an calcific nonstenotic disease affecting the cavernous segment of the left internal carotid artery. No visible ACA, MCA, or PCA stenosis. Large right cerebellar-pontine venous angioma.  Basilar artery widely patent with left greater than right vertebrals contributing.  There is cerebral, cerebellar, and brainstem atrophy with chronic microvascular ischemic change.  Remote right frontal infarct.  No visible acute stroke or intracranial hemorrhage.   Review of the MIP images confirms the above findings.  IMPRESSION: Collateral flow from the circle of Willis reconstitutes the right anterior circulation.  No visible proximal intracranial stenosis.  Original Report Authenticated By: Elsie Stain, M.D.   Ct Head Wo Contrast  07/13/2011  *RADIOLOGY REPORT*  Clinical Data: Weakness.  Evaluate for CVA  CT HEAD WITHOUT CONTRAST  Technique:  Contiguous axial images were obtained from the base of the skull through the vertex without contrast.  Comparison: The 07/11/2011  Findings: Right frontal lobe encephalomalacia is identified and appears unchanged from previous exam compatible with prior infarct.  There is diffuse patchy low density throughout the subcortical and periventricular white matter consistent with chronic small vessel ischemic change.  There is prominence of the sulci and ventricles consistent with brain atrophy.  There is no evidence for acute brain infarct, hemorrhage or mass.  The mastoid air cells are clear.  There is chronic asymmetric opacification of the right maxillary sinus.  The skull appears intact.  IMPRESSION:  1.  Small vessel ischemic disease and brain atrophy. 2.  Chronic right frontal lobe infarct.  Original Report Authenticated By: Rosealee Albee,  M.D.   Ct Angio Neck W/cm &/or Wo/cm  07/14/2011  *RADIOLOGY REPORT*  Clinical Data:  Left-sided weakness.  Perioperative incident with decreased oxygenation.  Atrial fibrillation.  Hypertension.  CT ANGIOGRAPHY HEAD AND NECK  Technique:  Multidetector CT imaging of the head and neck was performed using the standard protocol during bolus administration of intravenous contrast.  Multiplanar CT image reconstructions including MIPs were obtained to evaluate the vascular anatomy. Carotid stenosis measurements (when applicable) are obtained utilizing NASCET criteria, using the distal internal carotid diameter as the denominator.  Contrast: OMNIPAQUE IOHEXOL 350 MG/ML IV SOLN  Comparison:   None.  CTA NECK  Findings:  Conventional branching of the great vessels from the arch.  No proximal stenosis.  Calcific plaque surrounds both carotid bifurcations.  The right internal carotid artery is occluded without significant reconstitution in the neck.  The left internal carotid artery shows minimal nonstenotic plaque at its origin.  Both vertebrals are patent with the left dominant. There is no significant vertebral ostial stenosis.  Nasogastric tube.  No neck masses.  Asymmetric left laryngeal densities just underneath the left thyroid cartilage may reflect previous surgery.  Mild chronic sinus disease, worst in the right maxillary regions. Lung apices show scarring and air cyst formation, greater on the right.   Review of the MIP images confirms the above findings.  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  CTA HEAD  Findings:  Collaterals reconstitute the right anterior circulation intracranially via the circle of Willis with slight retrograde filling of the right cavernous segment. There is an calcific nonstenotic disease affecting  the cavernous segment of the left internal carotid artery. No visible ACA, MCA, or PCA stenosis. Large right cerebellar-pontine venous angioma.   Basilar artery widely patent with left greater than right vertebrals contributing.  There is cerebral, cerebellar, and brainstem atrophy with chronic microvascular ischemic change.  Remote right frontal infarct.  No visible acute stroke or intracranial hemorrhage.   Review of the MIP images confirms the above findings.  IMPRESSION: Collateral flow from the circle of Willis reconstitutes the right anterior circulation.  No visible proximal intracranial stenosis.  Original Report Authenticated By: Elsie Stain, M.D.   Dg Chest Port 1 View  07/14/2011  *RADIOLOGY REPORT*  Clinical Data: Respiratory distress. History of pulmonary embolism.  Asthma.  Hypertension.  PORTABLE CHEST - 1 VIEW  Comparison: 07/12/2011  Findings: Nasogastric extends beyond the  inferior aspect of the film.  Right-sided PICC line unchanged, tip at low SVC.  Normal heart size.  Right greater than left bilateral pleural effusion without significant change.  No pneumothorax.  Mild pulmonary venous congestion, accentuated by low lung volumes. Right greater than left bibasilar airspace disease is unchanged.  IMPRESSION:  1. No significant change since the prior exam. 2.  Mild pulmonary venous congestion and bilateral pleural effusions and bibasilar airspace disease, likely atelectasis.  Original Report Authenticated By: Consuello Bossier, M.D.    Medications:  Scheduled:   . antiseptic oral rinse  15 mL Mouth Rinse q12n4p  . chlorhexidine  15 mL Mouth Rinse BID  . insulin aspart  0-9 Units Subcutaneous Q4H  . lip balm  1 application Topical BID  . magnesium sulfate 1 - 4 g bolus IVPB  1 g Intravenous Once  . metoprolol  5 mg Intravenous Q6H  . metronidazole  500 mg Intravenous Q6H  . pantoprazole (PROTONIX) IV  40 mg Intravenous Q24H  . piperacillin-tazobactam (ZOSYN)  IV  3.375 g Intravenous Q8H  . potassium chloride  10 mEq Intravenous Q1 Hr x 6  . potassium chloride  10 mEq Intravenous Q1 Hr x 6  . sodium chloride  10-40 mL  Intracatheter Q12H  . sodium phosphate  Dextrose 5% IVPB  10 mmol Intravenous Once  . vancomycin  1,000 mg Intravenous Q24H   Continuous:   . sodium chloride 75 mL/hr at 07/12/11 1639  . TPN (CLINIMIX) +/- additives 75 mL/hr at 07/13/11 1825   And  . fat emulsion 250 mL (07/13/11 1824)  . heparin 1,600 Units/hr (07/14/11 0711)  . TPN (CLINIMIX) +/- additives 60 mL/hr at 07/13/11 1000  . TPN (CLINIMIX) +/- additives    . DISCONTD: diltiazem (CARDIZEM) infusion      Assessment/Plan: CVA with left hemiparesis, continue heparin continue recommendations per neurology. CT angiogram the neck shows occluded right ICA, carotid Doppler pending Status post lysis of adhesions, patient still of ileus Hypokalemia, recommend pharmacy increase potassium and TPN, potassium will be added to IV fluids, check magnesium Acute on chronic kidney disease History of PE. Recent ultrasound shows chronic bilateral DVT Status post GI bleed which required reversal of anticoagulation, EGD without definitive source, previous colonoscopy only revealed diverticulosis. Anemia multifactorial Clostridium difficile, Empiric antibiotics for probable hospital-acquired pneumonia, followup x-ray bilateral infiltrate, question of atelectasis, we will discuss with pulmonary the need for continued antibiotics.  LOS: 20 days   Alleya Demeter D 07/14/2011, 10:33 AM

## 2011-07-14 NOTE — Progress Notes (Signed)
4 Days Post-Op  Subjective: Weak.  Objective: Vital signs in last 24 hours: Temp:  [97.8 F (36.6 C)-98.9 F (37.2 C)] 98.4 F (36.9 C) (03/12 0800) Pulse Rate:  [116-141] 116  (03/12 0600) Resp:  [22-29] 23  (03/12 0600) BP: (129-171)/(71-102) 156/79 mmHg (03/12 0600) SpO2:  [97 %-100 %] 98 % (03/12 0600) Last BM Date: 07/13/11  Intake/Output from previous day: 03/11 0701 - 03/12 0700 In: 4790.5 [I.V.:1598; Blood:262.5; IV Piggyback:1250; TPN:1680] Out: 2615 [Urine:2565; Emesis/NG output:50] Intake/Output this shift:    PE: Abd-slightly firm and distended, incisions are clean Neuro-no change  Lab Results:   Basename 07/14/11 0432 07/13/11 0425  WBC 8.2 10.2  HGB 9.2* 8.2*  HCT 27.4* 24.4*  PLT 211 211   BMET  Basename 07/14/11 0432 07/13/11 0425  NA 136 137  K 2.6* 2.9*  CL 107 109  CO2 21 21  GLUCOSE 178* 189*  BUN 14 14  CREATININE 1.15 1.26  CALCIUM 7.7* 7.6*   PT/INR  Basename 07/14/11 0432 07/13/11 0425  LABPROT 15.8* 15.6*  INR 1.23 1.21   Comprehensive Metabolic Panel:    Component Value Date/Time   NA 136 07/14/2011 0432   K 2.6* 07/14/2011 0432   CL 107 07/14/2011 0432   CO2 21 07/14/2011 0432   BUN 14 07/14/2011 0432   CREATININE 1.15 07/14/2011 0432   GLUCOSE 178* 07/14/2011 0432   CALCIUM 7.7* 07/14/2011 0432   AST 13 07/13/2011 0425   ALT 12 07/13/2011 0425   ALKPHOS 68 07/13/2011 0425   BILITOT 0.3 07/13/2011 0425   PROT 4.6* 07/13/2011 0425   ALBUMIN 1.2* 07/13/2011 0425     Studies/Results: Ct Angio Head W/cm &/or Wo Cm  07/14/2011  *RADIOLOGY REPORT*  Clinical Data:  Left-sided weakness.  Perioperative incident with decreased oxygenation.  Atrial fibrillation.  Hypertension.  CT ANGIOGRAPHY HEAD AND NECK  Technique:  Multidetector CT imaging of the head and neck was performed using the standard protocol during bolus administration of intravenous contrast.  Multiplanar CT image reconstructions including MIPs were obtained to evaluate the  vascular anatomy. Carotid stenosis measurements (when applicable) are obtained utilizing NASCET criteria, using the distal internal carotid diameter as the denominator.  Contrast: OMNIPAQUE IOHEXOL 350 MG/ML IV SOLN  Comparison:   None.  CTA NECK  Findings:  Conventional branching of the great vessels from the arch.  No proximal stenosis.  Calcific plaque surrounds both carotid bifurcations.  The right internal carotid artery is occluded without significant reconstitution in the neck.  The left internal carotid artery shows minimal nonstenotic plaque at its origin.  Both vertebrals are patent with the left dominant. There is no significant vertebral ostial stenosis.  Nasogastric tube.  No neck masses.  Asymmetric left laryngeal densities just underneath the left thyroid cartilage may reflect previous surgery.  Mild chronic sinus disease, worst in the right maxillary regions. Lung apices show scarring and air cyst formation, greater on the right.   Review of the MIP images confirms the above findings.  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  CTA HEAD  Findings:  Collaterals reconstitute the right anterior circulation intracranially via the circle of Willis with slight retrograde filling of the right cavernous segment. There is an calcific nonstenotic disease affecting the cavernous segment of the left internal carotid artery. No visible ACA, MCA, or PCA stenosis. Large right cerebellar-pontine venous angioma.  Basilar artery widely patent with left greater than right vertebrals contributing.  There is cerebral,  cerebellar, and brainstem atrophy with chronic microvascular ischemic change.  Remote right frontal infarct.  No visible acute stroke or intracranial hemorrhage.   Review of the MIP images confirms the above findings.  IMPRESSION: Collateral flow from the circle of Willis reconstitutes the right anterior circulation.  No visible proximal intracranial stenosis.   Original Report Authenticated By: Elsie Stain, M.D.   Ct Head Wo Contrast  07/13/2011  *RADIOLOGY REPORT*  Clinical Data: Weakness.  Evaluate for CVA  CT HEAD WITHOUT CONTRAST  Technique:  Contiguous axial images were obtained from the base of the skull through the vertex without contrast.  Comparison: The 07/11/2011  Findings: Right frontal lobe encephalomalacia is identified and appears unchanged from previous exam compatible with prior infarct.  There is diffuse patchy low density throughout the subcortical and periventricular white matter consistent with chronic small vessel ischemic change.  There is prominence of the sulci and ventricles consistent with brain atrophy.  There is no evidence for acute brain infarct, hemorrhage or mass.  The mastoid air cells are clear.  There is chronic asymmetric opacification of the right maxillary sinus.  The skull appears intact.  IMPRESSION:  1.  Small vessel ischemic disease and brain atrophy. 2.  Chronic right frontal lobe infarct.  Original Report Authenticated By: Rosealee Albee, M.D.   Ct Angio Neck W/cm &/or Wo/cm  07/14/2011  *RADIOLOGY REPORT*  Clinical Data:  Left-sided weakness.  Perioperative incident with decreased oxygenation.  Atrial fibrillation.  Hypertension.  CT ANGIOGRAPHY HEAD AND NECK  Technique:  Multidetector CT imaging of the head and neck was performed using the standard protocol during bolus administration of intravenous contrast.  Multiplanar CT image reconstructions including MIPs were obtained to evaluate the vascular anatomy. Carotid stenosis measurements (when applicable) are obtained utilizing NASCET criteria, using the distal internal carotid diameter as the denominator.  Contrast: OMNIPAQUE IOHEXOL 350 MG/ML IV SOLN  Comparison:   None.  CTA NECK  Findings:  Conventional branching of the great vessels from the arch.  No proximal stenosis.  Calcific plaque surrounds both carotid bifurcations.  The right internal carotid  artery is occluded without significant reconstitution in the neck.  The left internal carotid artery shows minimal nonstenotic plaque at its origin.  Both vertebrals are patent with the left dominant. There is no significant vertebral ostial stenosis.  Nasogastric tube.  No neck masses.  Asymmetric left laryngeal densities just underneath the left thyroid cartilage may reflect previous surgery.  Mild chronic sinus disease, worst in the right maxillary regions. Lung apices show scarring and air cyst formation, greater on the right.   Review of the MIP images confirms the above findings.  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  CTA HEAD  Findings:  Collaterals reconstitute the right anterior circulation intracranially via the circle of Willis with slight retrograde filling of the right cavernous segment. There is an calcific nonstenotic disease affecting the cavernous segment of the left internal carotid artery. No visible ACA, MCA, or PCA stenosis. Large right cerebellar-pontine venous angioma.  Basilar artery widely patent with left greater than right vertebrals contributing.  There is cerebral, cerebellar, and brainstem atrophy with chronic microvascular ischemic change.  Remote right frontal infarct.  No visible acute stroke or intracranial hemorrhage.   Review of the MIP images confirms the above findings.  IMPRESSION: Collateral flow from the circle of Willis reconstitutes the right anterior circulation.  No visible proximal intracranial stenosis.  Original Report Authenticated By: Jonny Ruiz  T. CURNES, M.D.   Dg Chest Port 1 View  07/14/2011  *RADIOLOGY REPORT*  Clinical Data: Respiratory distress. History of pulmonary embolism.  Asthma.  Hypertension.  PORTABLE CHEST - 1 VIEW  Comparison: 07/12/2011  Findings: Nasogastric extends beyond the  inferior aspect of the film.  Right-sided PICC line unchanged, tip at low SVC.  Normal heart size.  Right greater than left  bilateral pleural effusion without significant change.  No pneumothorax.  Mild pulmonary venous congestion, accentuated by low lung volumes. Right greater than left bibasilar airspace disease is unchanged.  IMPRESSION:  1. No significant change since the prior exam. 2.  Mild pulmonary venous congestion and bilateral pleural effusions and bibasilar airspace disease, likely atelectasis.  Original Report Authenticated By: Consuello Bossier, M.D.    Anti-infectives: Anti-infectives     Start     Dose/Rate Route Frequency Ordered Stop   07/12/11 1800   metroNIDAZOLE (FLAGYL) IVPB 500 mg        500 mg 100 mL/hr over 60 Minutes Intravenous 4 times per day 07/12/11 1645     07/12/11 1530   vancomycin (VANCOCIN) 500 mg in sodium chloride irrigation 0.9 % 100 mL enema        500 mg 66.7 mL/hr over 90 Minutes Rectal 4 times per day 07/12/11 1450 07/13/11 0108   07/11/11 2359   vancomycin (VANCOCIN) IVPB 1000 mg/200 mL premix        1,000 mg 200 mL/hr over 60 Minutes Intravenous Every 24 hours 07/11/11 0807     07/10/11 1800   vancomycin (VANCOCIN) 500 mg in sodium chloride 0.9 % 100 mL IVPB  Status:  Discontinued        500 mg 100 mL/hr over 60 Minutes Intravenous Every 12 hours 07/10/11 1641 07/11/11 0806   07/10/11 1800  piperacillin-tazobactam (ZOSYN) IVPB 3.375 g       3.375 g 12.5 mL/hr over 240 Minutes Intravenous Every 8 hours 07/10/11 1708     07/10/11 0800   ertapenem (INVANZ) 1 g in sodium chloride 0.9 % 50 mL IVPB  Status:  Discontinued        1 g 100 mL/hr over 30 Minutes Intravenous Every 24 hours 07/10/11 0739 07/10/11 1659   06/26/11 2000   ceFAZolin (ANCEF) IVPB 1 g/50 mL premix        1 g 100 mL/hr over 30 Minutes Intravenous Every 6 hours 06/26/11 1658 06/27/11 0857          Assessment Active Problems:  PE (pulmonary embolism)  Chronic anticoagulation  SBO (small bowel obstruction)-s/p expl lap with LOA and repair of enterotomy-still with ileus  C. Diff colitis-still  with diarrhea; on Flagyl  Anemia  Hypokalemia-replacement in progress     LOS: 20 days   Plan: Leave ng in.  Wait for ileus to resolve.   Olin Gurski J 07/14/2011

## 2011-07-14 NOTE — Progress Notes (Signed)
ANTICOAGULATION CONSULT NOTE - Follow Up Consult  Pharmacy Consult for Heparin Indication: VTE and r/o CVA  No Known Allergies  Patient Measurements: Height: 5\' 6"  (167.6 cm) Weight: 153 lb 7 oz (69.6 kg) IBW/kg (Calculated) : 63.8   Vital Signs: Temp: 97.8 F (36.6 C) (03/12 0400) Temp src: Oral (03/12 0400) BP: 156/79 mmHg (03/12 0600) Pulse Rate: 116  (03/12 0600)  Labs:  Alvira Philips 07/14/11 0808 07/14/11 0432 07/13/11 1935 07/13/11 1320 07/13/11 0845 07/13/11 0425 07/12/11 2150 07/12/11 1655 07/12/11 0520  HGB -- 9.2* -- -- -- 8.2* -- -- --  HCT -- 27.4* -- -- -- 24.4* -- -- 25.9*  PLT -- 211 -- -- -- 211 -- -- 226  APTT -- -- -- -- -- -- -- -- --  LABPROT -- 15.8* -- -- -- 15.6* -- -- 15.8*  INR -- 1.23 -- -- -- 1.21 -- -- 1.23  HEPARINUNFRC 0.43 -- 0.24* -- 0.23* -- -- -- --  CREATININE -- 1.15 -- -- -- 1.26 -- 1.29 --  CKTOTAL -- -- -- 28 -- 35 43 -- --  CKMB -- -- -- 1.8 -- 1.9 1.9 -- --  TROPONINI -- -- -- <0.30 -- <0.30 <0.30 -- --   Estimated Creatinine Clearance: 50.1 ml/min (by C-G formula based on Cr of 1.15).   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 No interruptions or problems w/ IV site per RN.  Goal of Therapy:  Heparin Level 0.3-0.5   Plan:   Continue heparin infusion at 1600 ml/hr  Re-check heparin level in 8 hours to confirm therapeutic level  Daily heparin level, CBC    Lynann Beaver PharmD, BCPS Pager [phone] 07/14/2011 8:22 AM

## 2011-07-14 NOTE — Progress Notes (Signed)
Name: [name] MRN: [id number] DOB: [birth year]    LOS: 20  [place] [facility]/Critical [facility]  Brief patient profile:  78 YO male w/ history of PE, and CRI   admitted on 2/20 s/p a fall resulting in right hip fracture which was repaired by Ortho on 2/22. Post-op he developed GIB and bowel obstruction. He was followed conservatively, but ultimately went for Exploratory lap on 3/8, he underwent lysis of adhesion. Towards the end-of the surgical case anesthesia reported some difficulty with oxygenation. In the PACU he was unable to be extubated due to rising FIO2 requirements and increased respiratory distress. PCCM was asked to evaluate.   Lines / Drains: OETT 3/8>>> 3/9 PICC line 3/7>>>  Cultures: Sputum 3/8> nl flora PCT 3/8   5.31  PCT 3/9  18.64 PCT 3/10 10.99 3/10 c diff> POSITIVE  Antibiotics: invanz 3/8 (abd coverage)>>>3/8 Zosyn 3/8 (HCAP)>>> vanc 3/8 (HCAP)>>> 3/10 flagyl(cdif)>>  Tests / Events: 2/22 right hip ORIF 3/8 expl lap w/ lysis of adhesion 3/8 LE dopplers>>> B LE DVT that appear to be chronic  Subjective:  Follows commands. No O2. NAD at rest  Vital Signs: Temp:  [97.8 F (36.6 C)-98.9 F (37.2 C)] 97.8 F (36.6 C) (03/12 0400) Pulse Rate:  [116-141] 116  (03/12 0600) Resp:  [22-29] 23  (03/12 0600) BP: (129-174)/(71-102) 156/79 mmHg (03/12 0600) SpO2:  [97 %-100 %] 98 % (03/12 0600)  On RA      . sodium chloride 75 mL/hr at 07/12/11 1639  . TPN (CLINIMIX) +/- additives 75 mL/hr at 07/13/11 1825   And  . fat emulsion 250 mL (07/13/11 1824)  . heparin 1,600 Units/hr (07/14/11 0711)  . TPN (CLINIMIX) +/- additives 60 mL/hr at 07/13/11 1000  . DISCONTD: heparin 1,300 Units/hr (07/13/11 1000)    Intake/Output Summary (Last 24 hours) at 07/14/11 0812 Last data filed at 07/14/11 0600  Gross per 24 hour  Intake 4717.48 ml  Output   2340 ml  Net 2377.48 ml    Physical Examination: General:  Chronically ill appearing AAM  Neuro:  Awake, follows  commands, very weak globally  decreased movement LUE or LLE, L neglect HEENT:  No JVD Cardiovascular:  Tachy rrr Lungs:  Diffuse rhonchi Abdomen:  abd tender, mid abd incision CD&I Musculoskeletal:  intact   Labs and Imaging:   Lab 07/14/11 0432 07/13/11 0425 07/12/11 1655  NA 136 137 138  K 2.6* 2.9* 2.7*  CL 107 109 109  CO2 21 21 21   BUN 14 14 16   CREATININE 1.15 1.26 1.29  GLUCOSE 178* 189* 195*    Lab 07/14/11 0432 07/13/11 0425 07/12/11 0520  HGB 9.2* 8.2* 8.7*  HCT 27.4* 24.4* 25.9*  WBC 8.2 10.2 9.9  PLT 211 211 226    Assessment and Plan: SBO (small bowel obstruction) now s/p exp lap with lysis of adhesions 3/8 - CCS following - NGT in place   Acute respiratory failure with Post-op hypoxia: CXR showing new right sided infiltrate vs Atelectasis. Does have history of PE (pulmonary embolism diagnosed in July 2012). He has B chronic DVT on dopplers here, has been on anticoagulation up until bleeding noted on 2/27. Extubated 3/9 Plan: -scheduled BDs  -HCAP coverage will consider d/c  - LE dopplers >> positive for chronic DVT -continue IV heparin for DVT and presumed CVA -PUSH pulm toilet, high risk reintubation due to CVA  New L hemiparesis and apparent CVA -MRI brain ordered, but  Can't do with staples in place, will have  to defer -continue heparin gtt -add ASA PR    Fracture of hip, right, closed 2/22 Plan: -post-op care per ortho  A-fib. With tachycardia o/n 3/8, suspect due to demand of acute illness, dilt gtt started 3/8 Plan: -dilt weaned to off - metoprolol prn  And scheduled q6h - heparin  Hypokalemia  Lab 07/14/11 0432 07/13/11 0425 07/12/11 1655  K 2.6* 2.9* 2.7*  plan: -follow and replace. and phos repleted 3/12  Anemia, on heparin  Basename 07/14/11 0432 07/13/11 0425  HGB 9.2* 8.2*   - 1 u PRBC 3/11 - follow CBC  H/o renal insuff Recent Labs  Basename 07/14/11 0432 07/13/11 0425 07/12/11 1655   CREATININE 1.15 1.26 1.29    plan: -follow UOP and S Cr  Best practices / Disposition: --> CCM to sign off since Dr Nehemiah Settle following -->full code -->IV Heparin gtt -->Protonix for GI Px     Brett Canales Minor ACNP Adolph Pollack PCCM Pager [phone] till 3 pm If no answer page [phone] 07/14/2011, 8:12 AM  Pt independently  seen and examined and available cxr's reviewed and I agree with above findings/ imp/ plan   Sandrea Hughs, MD Pulmonary and Critical Care Medicine [facility] Cell [phone]

## 2011-07-14 NOTE — Progress Notes (Signed)
CRITICAL VALUE ALERT  Critical value received:  K+ 2.6  Date of notification:  07/14/11  Time of notification:  0530  Critical value read back:yes  Nurse who received alert:  M. Danelia Snodgrass  MD notified (1st page):  Dr. Craige Cotta  Time of first page:  0530  MD notified (2nd page):  Time of second page:  Responding MD:    Time MD responded:

## 2011-07-15 ENCOUNTER — Encounter (HOSPITAL_COMMUNITY): Payer: Self-pay | Admitting: Anesthesiology

## 2011-07-15 LAB — BASIC METABOLIC PANEL
BUN: 14 mg/dL (ref 6–23)
Calcium: 7.6 mg/dL — ABNORMAL LOW (ref 8.4–10.5)
GFR calc non Af Amer: 67 mL/min — ABNORMAL LOW (ref >90)
Glucose, Bld: 226 mg/dL — ABNORMAL HIGH (ref 70–99)

## 2011-07-15 LAB — CBC
HCT: 29.1 % — ABNORMAL LOW (ref 39.0–52.0)
Hemoglobin: 9.9 g/dL — ABNORMAL LOW (ref 13.0–17.0)
MCH: 28.5 pg (ref 26.0–34.0)
MCHC: 34 g/dL (ref 30.0–36.0)
MCV: 83.9 fL (ref 78.0–100.0)

## 2011-07-15 LAB — GLUCOSE, CAPILLARY
Glucose-Capillary: 220 mg/dL — ABNORMAL HIGH (ref 70–99)
Glucose-Capillary: 195 mg/dL — ABNORMAL HIGH (ref 70–99)
Glucose-Capillary: 252 mg/dL — ABNORMAL HIGH (ref 70–99)
Glucose-Capillary: 195 mg/dL — ABNORMAL HIGH (ref 70–99)

## 2011-07-15 LAB — MAGNESIUM: Magnesium: 1.7 mg/dL (ref 1.5–2.5)

## 2011-07-15 MED ORDER — VANCOMYCIN HCL IN DEXTROSE 1-5 GM/200ML-% IV SOLN
1000.0000 mg | Freq: Two times a day (BID) | INTRAVENOUS | Status: DC
  Administered 2011-07-15 – 2011-07-16 (×3): 1000 mg via INTRAVENOUS
  Filled 2011-07-15 (×5): qty 200

## 2011-07-15 MED ORDER — FAT EMULSION 20 % IV EMUL
250.0000 mL | INTRAVENOUS | Status: AC
  Administered 2011-07-15: 250 mL via INTRAVENOUS
  Filled 2011-07-15: qty 250

## 2011-07-15 MED ORDER — POTASSIUM CHLORIDE 10 MEQ/100ML IV SOLN
10.0000 meq | INTRAVENOUS | Status: AC
  Administered 2011-07-15 (×5): 10 meq via INTRAVENOUS
  Filled 2011-07-15 (×5): qty 100

## 2011-07-15 MED ORDER — INSULIN ASPART 100 UNIT/ML ~~LOC~~ SOLN
0.0000 [IU] | SUBCUTANEOUS | Status: DC
  Administered 2011-07-15 (×2): 8 [IU] via SUBCUTANEOUS
  Administered 2011-07-15: 3 [IU] via SUBCUTANEOUS
  Administered 2011-07-16: 8 [IU] via SUBCUTANEOUS
  Administered 2011-07-16 (×4): 5 [IU] via SUBCUTANEOUS
  Administered 2011-07-16 – 2011-07-17 (×2): 3 [IU] via SUBCUTANEOUS
  Administered 2011-07-17 (×2): 5 [IU] via SUBCUTANEOUS
  Administered 2011-07-17: 3 [IU] via SUBCUTANEOUS
  Administered 2011-07-17: 5 [IU] via SUBCUTANEOUS
  Administered 2011-07-17: 3 [IU] via SUBCUTANEOUS
  Administered 2011-07-18: 8 [IU] via SUBCUTANEOUS
  Administered 2011-07-18 (×4): 5 [IU] via SUBCUTANEOUS
  Administered 2011-07-18: 3 [IU] via SUBCUTANEOUS
  Administered 2011-07-18: 5 [IU] via SUBCUTANEOUS
  Administered 2011-07-19 – 2011-07-23 (×26): 3 [IU] via SUBCUTANEOUS

## 2011-07-15 MED ORDER — TRACE MINERALS CR-CU-MN-SE-ZN 10-1000-500-60 MCG/ML IV SOLN
INTRAVENOUS | Status: AC
  Administered 2011-07-15: 17:00:00 via INTRAVENOUS
  Filled 2011-07-15: qty 1800

## 2011-07-15 NOTE — Progress Notes (Signed)
5 Days Post-Op  Subjective: Feels a little better.  Feels like his right hand is moving more.  Objective: Vital signs in last 24 hours: Temp:  [97.5 F (36.4 C)-98.5 F (36.9 C)] 97.5 F (36.4 C) (03/13 0400) Pulse Rate:  [110-131] 110  (03/13 0600) Resp:  [21-25] 22  (03/13 0600) BP: (142-170)/(80-97) 149/84 mmHg (03/13 0500) SpO2:  [97 %-100 %] 98 % (03/13 0600) Weight:  [157 lb 13.6 oz (71.6 kg)] 157 lb 13.6 oz (71.6 kg) (03/13 0600) Last BM Date: 07/13/11  Intake/Output from previous day: 03/12 0701 - 03/13 0700 In: 3986 [I.V.:768; NG/GT:200; IV Piggyback:1050; TPN:1650] Out: 2425 [Urine:2225; Emesis/NG output:100; Stool:100] Intake/Output this shift:    PE: Abd-slightly firm and distended, hypoactive BS, incisions clean Neuro-slightly stronger left hand grip  Lab Results:   Basename 07/15/11 0500 07/14/11 0432  WBC 8.2 8.2  HGB 9.9* 9.2*  HCT 29.1* 27.4*  PLT 242 211   BMET  Basename 07/15/11 0500 07/14/11 1449  NA 134* 136  K 3.0* 3.1*  CL 105 108  CO2 22 21  GLUCOSE 226* 199*  BUN 14 14  CREATININE 1.05 1.09  CALCIUM 7.6* 7.7*   PT/INR  Basename 07/15/11 0500 07/14/11 0432  LABPROT 17.6* 15.8*  INR 1.42 1.23   Comprehensive Metabolic Panel:    Component Value Date/Time   NA 134* 07/15/2011 0500   K 3.0* 07/15/2011 0500   CL 105 07/15/2011 0500   CO2 22 07/15/2011 0500   BUN 14 07/15/2011 0500   CREATININE 1.05 07/15/2011 0500   GLUCOSE 226* 07/15/2011 0500   CALCIUM 7.6* 07/15/2011 0500   AST 13 07/13/2011 0425   ALT 12 07/13/2011 0425   ALKPHOS 68 07/13/2011 0425   BILITOT 0.3 07/13/2011 0425   PROT 4.6* 07/13/2011 0425   ALBUMIN 1.2* 07/13/2011 0425     Studies/Results: Ct Angio Head W/cm &/or Wo Cm  07/14/2011  *RADIOLOGY REPORT*  Clinical Data:  Left-sided weakness.  Perioperative incident with decreased oxygenation.  Atrial fibrillation.  Hypertension.  CT ANGIOGRAPHY HEAD AND NECK  Technique:  Multidetector CT imaging of the head and neck was  performed using the standard protocol during bolus administration of intravenous contrast.  Multiplanar CT image reconstructions including MIPs were obtained to evaluate the vascular anatomy. Carotid stenosis measurements (when applicable) are obtained utilizing NASCET criteria, using the distal internal carotid diameter as the denominator.  Contrast: OMNIPAQUE IOHEXOL 350 MG/ML IV SOLN  Comparison:   None.  CTA NECK  Findings:  Conventional branching of the great vessels from the arch.  No proximal stenosis.  Calcific plaque surrounds both carotid bifurcations.  The right internal carotid artery is occluded without significant reconstitution in the neck.  The left internal carotid artery shows minimal nonstenotic plaque at its origin.  Both vertebrals are patent with the left dominant. There is no significant vertebral ostial stenosis.  Nasogastric tube.  No neck masses.  Asymmetric left laryngeal densities just underneath the left thyroid cartilage may reflect previous surgery.  Mild chronic sinus disease, worst in the right maxillary regions. Lung apices show scarring and air cyst formation, greater on the right.   Review of the MIP images confirms the above findings.  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  CTA HEAD  Findings:  Collaterals reconstitute the right anterior circulation intracranially via the circle of Willis with slight retrograde filling of the right cavernous segment. There is an calcific nonstenotic disease affecting the cavernous segment of  the left internal carotid artery. No visible ACA, MCA, or PCA stenosis. Large right cerebellar-pontine venous angioma.  Basilar artery widely patent with left greater than right vertebrals contributing.  There is cerebral, cerebellar, and brainstem atrophy with chronic microvascular ischemic change.  Remote right frontal infarct.  No visible acute stroke or intracranial hemorrhage.   Review of the MIP images  confirms the above findings.  IMPRESSION: Collateral flow from the circle of Willis reconstitutes the right anterior circulation.  No visible proximal intracranial stenosis.  Original Report Authenticated By: Elsie Stain, M.D.   Ct Head Wo Contrast  07/13/2011  *RADIOLOGY REPORT*  Clinical Data: Weakness.  Evaluate for CVA  CT HEAD WITHOUT CONTRAST  Technique:  Contiguous axial images were obtained from the base of the skull through the vertex without contrast.  Comparison: The 07/11/2011  Findings: Right frontal lobe encephalomalacia is identified and appears unchanged from previous exam compatible with prior infarct.  There is diffuse patchy low density throughout the subcortical and periventricular white matter consistent with chronic small vessel ischemic change.  There is prominence of the sulci and ventricles consistent with brain atrophy.  There is no evidence for acute brain infarct, hemorrhage or mass.  The mastoid air cells are clear.  There is chronic asymmetric opacification of the right maxillary sinus.  The skull appears intact.  IMPRESSION:  1.  Small vessel ischemic disease and brain atrophy. 2.  Chronic right frontal lobe infarct.  Original Report Authenticated By: Rosealee Albee, M.D.   Ct Angio Neck W/cm &/or Wo/cm  07/14/2011  *RADIOLOGY REPORT*  Clinical Data:  Left-sided weakness.  Perioperative incident with decreased oxygenation.  Atrial fibrillation.  Hypertension.  CT ANGIOGRAPHY HEAD AND NECK  Technique:  Multidetector CT imaging of the head and neck was performed using the standard protocol during bolus administration of intravenous contrast.  Multiplanar CT image reconstructions including MIPs were obtained to evaluate the vascular anatomy. Carotid stenosis measurements (when applicable) are obtained utilizing NASCET criteria, using the distal internal carotid diameter as the denominator.  Contrast: OMNIPAQUE IOHEXOL 350 MG/ML IV SOLN  Comparison:   None.  CTA NECK   Findings:  Conventional branching of the great vessels from the arch.  No proximal stenosis.  Calcific plaque surrounds both carotid bifurcations.  The right internal carotid artery is occluded without significant reconstitution in the neck.  The left internal carotid artery shows minimal nonstenotic plaque at its origin.  Both vertebrals are patent with the left dominant. There is no significant vertebral ostial stenosis.  Nasogastric tube.  No neck masses.  Asymmetric left laryngeal densities just underneath the left thyroid cartilage may reflect previous surgery.  Mild chronic sinus disease, worst in the right maxillary regions. Lung apices show scarring and air cyst formation, greater on the right.   Review of the MIP images confirms the above findings.  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  CTA HEAD  Findings:  Collaterals reconstitute the right anterior circulation intracranially via the circle of Willis with slight retrograde filling of the right cavernous segment. There is an calcific nonstenotic disease affecting the cavernous segment of the left internal carotid artery. No visible ACA, MCA, or PCA stenosis. Large right cerebellar-pontine venous angioma.  Basilar artery widely patent with left greater than right vertebrals contributing.  There is cerebral, cerebellar, and brainstem atrophy with chronic microvascular ischemic change.  Remote right frontal infarct.  No visible acute stroke or intracranial hemorrhage.   Review of  the MIP images confirms the above findings.  IMPRESSION: Collateral flow from the circle of Willis reconstitutes the right anterior circulation.  No visible proximal intracranial stenosis.  Original Report Authenticated By: Elsie Stain, M.D.   Dg Chest Port 1 View  07/14/2011  *RADIOLOGY REPORT*  Clinical Data: Respiratory distress. History of pulmonary embolism.  Asthma.  Hypertension.  PORTABLE CHEST - 1 VIEW  Comparison:  07/12/2011  Findings: Nasogastric extends beyond the  inferior aspect of the film.  Right-sided PICC line unchanged, tip at low SVC.  Normal heart size.  Right greater than left bilateral pleural effusion without significant change.  No pneumothorax.  Mild pulmonary venous congestion, accentuated by low lung volumes. Right greater than left bibasilar airspace disease is unchanged.  IMPRESSION:  1. No significant change since the prior exam. 2.  Mild pulmonary venous congestion and bilateral pleural effusions and bibasilar airspace disease, likely atelectasis.  Original Report Authenticated By: Consuello Bossier, M.D.    Anti-infectives: Anti-infectives     Start     Dose/Rate Route Frequency Ordered Stop   07/15/11 0900   vancomycin (VANCOCIN) IVPB 1000 mg/200 mL premix        1,000 mg 200 mL/hr over 60 Minutes Intravenous Every 12 hours 07/15/11 0407     07/12/11 1800   metroNIDAZOLE (FLAGYL) IVPB 500 mg        500 mg 100 mL/hr over 60 Minutes Intravenous 4 times per day 07/12/11 1645     07/12/11 1530   vancomycin (VANCOCIN) 500 mg in sodium chloride irrigation 0.9 % 100 mL enema        500 mg 66.7 mL/hr over 90 Minutes Rectal 4 times per day 07/12/11 1450 07/13/11 0108   07/11/11 2359   vancomycin (VANCOCIN) IVPB 1000 mg/200 mL premix  Status:  Discontinued        1,000 mg 200 mL/hr over 60 Minutes Intravenous Every 24 hours 07/11/11 0807 07/15/11 0407   07/10/11 1800   vancomycin (VANCOCIN) 500 mg in sodium chloride 0.9 % 100 mL IVPB  Status:  Discontinued        500 mg 100 mL/hr over 60 Minutes Intravenous Every 12 hours 07/10/11 1641 07/11/11 0806   07/10/11 1800   piperacillin-tazobactam (ZOSYN) IVPB 3.375 g  Status:  Discontinued        3.375 g 12.5 mL/hr over 240 Minutes Intravenous Every 8 hours 07/10/11 1708 07/15/11 0720   07/10/11 0800   ertapenem (INVANZ) 1 g in sodium chloride 0.9 % 50 mL IVPB  Status:  Discontinued        1 g 100 mL/hr over 30 Minutes Intravenous Every  24 hours 07/10/11 0739 07/10/11 1659   06/26/11 2000   ceFAZolin (ANCEF) IVPB 1 g/50 mL premix        1 g 100 mL/hr over 30 Minutes Intravenous Every 6 hours 06/26/11 1658 06/27/11 0857          Assessment Active Problems:  PE (pulmonary embolism)  Chronic anticoagulation   SBO s/p expl lap, LOA, repair of enterotomy-ileus persists.  Hypokalemia-slowly improving  Anema-improved  C. Diff colitis-less diarrhea  CVA   LOS: 21 days   Plan: Continue parenteral nutrition until ileus resolves.   [name] J 07/15/2011

## 2011-07-15 NOTE — Progress Notes (Signed)
Inpatient Diabetes Program Recommendations  AACE/ADA: New Consensus Statement on Inpatient Glycemic Control (2009)  Target Ranges:  Prepandial:   less than 140 mg/dL      Peak postprandial:   less than 180 mg/dL (1-2 hours)      Critically ill patients:  140 - 180 mg/dL  Results for [name], [name] (MRN [id number]) as of 07/15/2011 10:25  Ref. Range 07/14/2011 15:38 07/14/2011 19:36 07/14/2011 23:52 07/15/2011 03:56 07/15/2011 08:01  Glucose-Capillary Latest Range: 70-99 mg/dL 409 (H) 811 (H) 914 (H) 220 (H) 195 (H)    Inpatient Diabetes Program Recommendations Correction (SSI): Increase to MODERATE scale

## 2011-07-15 NOTE — Progress Notes (Signed)
Subjective: No new events overnight, patient still continues to have diarrhea, continues with ileus. Followup x-ray reviewed, no pneumonia, shows atelectasis and effusion. BNP minimally elevated, echo report pending per nursing echo has been done. I also do not see carotid Doppler however CT angiogram was performed. Case discussed with patient's family in detail.  Objective: Vital signs in last 24 hours: Temp:  [97.5 F (36.4 C)-98.5 F (36.9 C)] 97.6 F (36.4 C) (03/13 1200) Pulse Rate:  [110-131] 123  (03/13 1100) Resp:  [21-25] 24  (03/13 1100) BP: (142-172)/(80-97) 172/94 mmHg (03/13 1100) SpO2:  [98 %-100 %] 98 % (03/13 1100) Weight:  [71.6 kg (157 lb 13.6 oz)] 71.6 kg (157 lb 13.6 oz) (03/13 0600) Weight change:  Last BM Date: 07/13/11  Intake/Output from previous day: 03/12 0701 - 03/13 0700 In: 3986 [I.V.:768; NG/GT:200; IV Piggyback:1050; TPN:1650] Out: 2425 [Urine:2225; Emesis/NG output:100; Stool:100] Intake/Output this shift: Total I/O In: 1246 [I.V.:96; IV Piggyback:700; TPN:450] Out: 1200 [Urine:1200]  General appearance: alert and cooperative Resp: clear to auscultation bilaterally Cardio: still tachycardic but regular GI: abdomen distended, decreased bowel sounds Extremities: PAS hose in place  Lab Results:  Results for orders placed during the hospital encounter of 06/24/11 (from the past 24 hour(s))  BASIC METABOLIC PANEL     Status: Abnormal   Collection Time   07/14/11  2:49 PM      Component Value Range   Sodium 136  135 - 145 (mEq/L)   Potassium 3.1 (*) 3.5 - 5.1 (mEq/L)   Chloride 108  96 - 112 (mEq/L)   CO2 21  19 - 32 (mEq/L)   Glucose, Bld 199 (*) 70 - 99 (mg/dL)   BUN 14  6 - 23 (mg/dL)   Creatinine, Ser 1.47  0.50 - 1.35 (mg/dL)   Calcium 7.7 (*) 8.4 - 10.5 (mg/dL)   GFR calc non Af Amer 64 (*) >90 (mL/min)   GFR calc Af Amer 75 (*) >90 (mL/min)  GLUCOSE, CAPILLARY     Status: Abnormal   Collection Time   07/14/11  3:38 PM      Component  Value Range   Glucose-Capillary 190 (*) 70 - 99 (mg/dL)  HEPARIN LEVEL (UNFRACTIONATED)     Status: Normal   Collection Time   07/14/11  7:15 PM      Component Value Range   Heparin Unfractionated 0.47  0.30 - 0.70 (IU/mL)  GLUCOSE, CAPILLARY     Status: Abnormal   Collection Time   07/14/11  7:36 PM      Component Value Range   Glucose-Capillary 196 (*) 70 - 99 (mg/dL)  VANCOMYCIN, TROUGH     Status: Abnormal   Collection Time   07/14/11 11:30 PM      Component Value Range   Vancomycin Tr 8.7 (*) 10.0 - 20.0 (ug/mL)  GLUCOSE, CAPILLARY     Status: Abnormal   Collection Time   07/14/11 11:52 PM      Component Value Range   Glucose-Capillary 200 (*) 70 - 99 (mg/dL)   Comment 1 Notify RN    GLUCOSE, CAPILLARY     Status: Abnormal   Collection Time   07/15/11  3:56 AM      Component Value Range   Glucose-Capillary 220 (*) 70 - 99 (mg/dL)  PROTIME-INR     Status: Abnormal   Collection Time   07/15/11  5:00 AM      Component Value Range   Prothrombin Time 17.6 (*) 11.6 - 15.2 (seconds)  INR 1.42  0.00 - 1.49   CBC     Status: Abnormal   Collection Time   07/15/11  5:00 AM      Component Value Range   WBC 8.2  4.0 - 10.5 (K/uL)   RBC 3.47 (*) 4.22 - 5.81 (MIL/uL)   Hemoglobin 9.9 (*) 13.0 - 17.0 (g/dL)   HCT 16.1 (*) 09.6 - 52.0 (%)   MCV 83.9  78.0 - 100.0 (fL)   MCH 28.5  26.0 - 34.0 (pg)   MCHC 34.0  30.0 - 36.0 (g/dL)   RDW 04.5 (*) 40.9 - 15.5 (%)   Platelets 242  150 - 400 (K/uL)  BASIC METABOLIC PANEL     Status: Abnormal   Collection Time   07/15/11  5:00 AM      Component Value Range   Sodium 134 (*) 135 - 145 (mEq/L)   Potassium 3.0 (*) 3.5 - 5.1 (mEq/L)   Chloride 105  96 - 112 (mEq/L)   CO2 22  19 - 32 (mEq/L)   Glucose, Bld 226 (*) 70 - 99 (mg/dL)   BUN 14  6 - 23 (mg/dL)   Creatinine, Ser 8.11  0.50 - 1.35 (mg/dL)   Calcium 7.6 (*) 8.4 - 10.5 (mg/dL)   GFR calc non Af Amer 67 (*) >90 (mL/min)   GFR calc Af Amer 78 (*) >90 (mL/min)  HEPARIN LEVEL  (UNFRACTIONATED)     Status: Normal   Collection Time   07/15/11  5:00 AM      Component Value Range   Heparin Unfractionated 0.50  0.30 - 0.70 (IU/mL)  PHOSPHORUS     Status: Normal   Collection Time   07/15/11  5:00 AM      Component Value Range   Phosphorus 2.6  2.3 - 4.6 (mg/dL)  MAGNESIUM     Status: Normal   Collection Time   07/15/11  5:00 AM      Component Value Range   Magnesium 1.7  1.5 - 2.5 (mg/dL)  PRO B NATRIURETIC PEPTIDE     Status: Normal   Collection Time   07/15/11  5:00 AM      Component Value Range   Pro B Natriuretic peptide (BNP) 357.0  0 - 450 (pg/mL)  GLUCOSE, CAPILLARY     Status: Abnormal   Collection Time   07/15/11  8:01 AM      Component Value Range   Glucose-Capillary 195 (*) 70 - 99 (mg/dL)  GLUCOSE, CAPILLARY     Status: Abnormal   Collection Time   07/15/11 11:30 AM      Component Value Range   Glucose-Capillary 254 (*) 70 - 99 (mg/dL)   Comment 1 Documented in Chart     Comment 2 Notify RN        Studies/Results: Ct Angio Head W/cm &/or Wo Cm  07/14/2011  *RADIOLOGY REPORT*  Clinical Data:  Left-sided weakness.  Perioperative incident with decreased oxygenation.  Atrial fibrillation.  Hypertension.  CT ANGIOGRAPHY HEAD AND NECK  Technique:  Multidetector CT imaging of the head and neck was performed using the standard protocol during bolus administration of intravenous contrast.  Multiplanar CT image reconstructions including MIPs were obtained to evaluate the vascular anatomy. Carotid stenosis measurements (when applicable) are obtained utilizing NASCET criteria, using the distal internal carotid diameter as the denominator.  Contrast: OMNIPAQUE IOHEXOL 350 MG/ML IV SOLN  Comparison:   None.  CTA NECK  Findings:  Conventional branching of the great vessels from  the arch.  No proximal stenosis.  Calcific plaque surrounds both carotid bifurcations.  The right internal carotid artery is occluded without significant reconstitution in the neck.   The left internal carotid artery shows minimal nonstenotic plaque at its origin.  Both vertebrals are patent with the left dominant. There is no significant vertebral ostial stenosis.  Nasogastric tube.  No neck masses.  Asymmetric left laryngeal densities just underneath the left thyroid cartilage may reflect previous surgery.  Mild chronic sinus disease, worst in the right maxillary regions. Lung apices show scarring and air cyst formation, greater on the right.   Review of the MIP images confirms the above findings.  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  CTA HEAD  Findings:  Collaterals reconstitute the right anterior circulation intracranially via the circle of Willis with slight retrograde filling of the right cavernous segment. There is an calcific nonstenotic disease affecting the cavernous segment of the left internal carotid artery. No visible ACA, MCA, or PCA stenosis. Large right cerebellar-pontine venous angioma.  Basilar artery widely patent with left greater than right vertebrals contributing.  There is cerebral, cerebellar, and brainstem atrophy with chronic microvascular ischemic change.  Remote right frontal infarct.  No visible acute stroke or intracranial hemorrhage.   Review of the MIP images confirms the above findings.  IMPRESSION: Collateral flow from the circle of Willis reconstitutes the right anterior circulation.  No visible proximal intracranial stenosis.  Original Report Authenticated By: Elsie Stain, M.D.   Ct Angio Neck W/cm &/or Wo/cm  07/14/2011  *RADIOLOGY REPORT*  Clinical Data:  Left-sided weakness.  Perioperative incident with decreased oxygenation.  Atrial fibrillation.  Hypertension.  CT ANGIOGRAPHY HEAD AND NECK  Technique:  Multidetector CT imaging of the head and neck was performed using the standard protocol during bolus administration of intravenous contrast.  Multiplanar CT image reconstructions including MIPs were  obtained to evaluate the vascular anatomy. Carotid stenosis measurements (when applicable) are obtained utilizing NASCET criteria, using the distal internal carotid diameter as the denominator.  Contrast: OMNIPAQUE IOHEXOL 350 MG/ML IV SOLN  Comparison:   None.  CTA NECK  Findings:  Conventional branching of the great vessels from the arch.  No proximal stenosis.  Calcific plaque surrounds both carotid bifurcations.  The right internal carotid artery is occluded without significant reconstitution in the neck.  The left internal carotid artery shows minimal nonstenotic plaque at its origin.  Both vertebrals are patent with the left dominant. There is no significant vertebral ostial stenosis.  Nasogastric tube.  No neck masses.  Asymmetric left laryngeal densities just underneath the left thyroid cartilage may reflect previous surgery.  Mild chronic sinus disease, worst in the right maxillary regions. Lung apices show scarring and air cyst formation, greater on the right.   Review of the MIP images confirms the above findings.  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  CTA HEAD  Findings:  Collaterals reconstitute the right anterior circulation intracranially via the circle of Willis with slight retrograde filling of the right cavernous segment. There is an calcific nonstenotic disease affecting the cavernous segment of the left internal carotid artery. No visible ACA, MCA, or PCA stenosis. Large right cerebellar-pontine venous angioma.  Basilar artery widely patent with left greater than right vertebrals contributing.  There is cerebral, cerebellar, and brainstem atrophy with chronic microvascular ischemic change.  Remote right frontal infarct.  No visible acute stroke or intracranial hemorrhage.   Review  of the MIP images confirms the above findings.  IMPRESSION: Collateral flow from the circle of Willis reconstitutes the right anterior circulation.  No visible  proximal intracranial stenosis.  Original Report Authenticated By: Elsie Stain, M.D.   Dg Chest Port 1 View  07/14/2011  *RADIOLOGY REPORT*  Clinical Data: Respiratory distress. History of pulmonary embolism.  Asthma.  Hypertension.  PORTABLE CHEST - 1 VIEW  Comparison: 07/12/2011  Findings: Nasogastric extends beyond the  inferior aspect of the film.  Right-sided PICC line unchanged, tip at low SVC.  Normal heart size.  Right greater than left bilateral pleural effusion without significant change.  No pneumothorax.  Mild pulmonary venous congestion, accentuated by low lung volumes. Right greater than left bibasilar airspace disease is unchanged.  IMPRESSION:  1. No significant change since the prior exam. 2.  Mild pulmonary venous congestion and bilateral pleural effusions and bibasilar airspace disease, likely atelectasis.  Original Report Authenticated By: Consuello Bossier, M.D.    Medications:  Scheduled:   . antiseptic oral rinse  15 mL Mouth Rinse q12n4p  . chlorhexidine  15 mL Mouth Rinse BID  . insulin aspart  0-15 Units Subcutaneous Q4H  . lip balm  1 application Topical BID  . metoprolol  5 mg Intravenous Q6H  . metronidazole  500 mg Intravenous Q6H  . pantoprazole (PROTONIX) IV  40 mg Intravenous Q24H  . potassium chloride  10 mEq Intravenous Q1 Hr x 5  . potassium chloride  10 mEq Intravenous Q1 Hr x 6  . potassium chloride  10 mEq Intravenous Q1 Hr x 3  . sodium chloride  10-40 mL Intracatheter Q12H  . sodium phosphate  Dextrose 5% IVPB  10 mmol Intravenous Once  . vancomycin  1,000 mg Intravenous Q12H  . DISCONTD: insulin aspart  0-9 Units Subcutaneous Q4H  . DISCONTD: piperacillin-tazobactam (ZOSYN)  IV  3.375 g Intravenous Q8H  . DISCONTD: vancomycin  1,000 mg Intravenous Q24H   Continuous:   . TPN (CLINIMIX) +/- additives 75 mL/hr at 07/13/11 1825   And  . fat emulsion 250 mL (07/13/11 1824)  . fat emulsion    . heparin 1,600 Units/hr (07/15/11 0100)  . 0.9 % sodium  chloride with kcl 50 mL/hr at 07/14/11 1448  . TPN (CLINIMIX) +/- additives 75 mL/hr at 07/14/11 1738  . TPN (CLINIMIX) +/- additives      Assessment/Plan:  CVA with left hemiparesis, continue heparin continue recommendations per neurology. CT angiogram the neck shows occluded right ICA, Status post lysis of adhesions, patient still of ileus  Hypokalemia, recommend pharmacy increase potassium in TPN, We will continue some potassium in IV fluids and potassium runs Acute on chronic kidney disease, Resolved  History of PE. Recent ultrasound shows chronic bilateral DVT  Status post GI bleed which required reversal of anticoagulation, EGD without definitive source, previous colonoscopy only revealed diverticulosis.  Anemia multifactorial , Hemoglobin stable no recurrent bleeding Clostridium difficile, All Flagyl and vancomycin Empiric antibiotics for probable hospital-acquired pneumonia, followup x-ray bilateral infiltrate, question of atelectasis,Zosyn DC'd today Ileus management per surgery Elevated CBGs, insulin been added to TPN Mildly elevated BNP, Echo result pending, currently patient is oxygenating well. Malnutrition continue TPN Decondition continue physical therapy  LOS: 21 days   Deavin Forst D 07/15/2011, 12:48 PM

## 2011-07-15 NOTE — Progress Notes (Signed)
ANTICOAGULATION CONSULT NOTE - Follow Up Consult  Pharmacy Consult for Heparin Indication: VTE, Afib, and r/o CVA  No Known Allergies  Patient Measurements: Height: 5\' 6"  (167.6 cm) Weight: 157 lb 13.6 oz (71.6 kg) IBW/kg (Calculated) : 63.8   Vital Signs: Temp: 97.5 F (36.4 C) (03/13 0400) Temp src: Oral (03/13 0400) BP: 149/84 mmHg (03/13 0500) Pulse Rate: 110  (03/13 0600)  Labs:  Basename 07/15/11 0500 07/14/11 1915 07/14/11 1449 07/14/11 0808 07/14/11 0432 07/13/11 1320 07/13/11 0425 07/12/11 2150  HGB 9.9* -- -- -- 9.2* -- -- --  HCT 29.1* -- -- -- 27.4* -- 24.4* --  PLT 242 -- -- -- 211 -- 211 --  APTT -- -- -- -- -- -- -- --  LABPROT 17.6* -- -- -- 15.8* -- 15.6* --  INR 1.42 -- -- -- 1.23 -- 1.21 --  HEPARINUNFRC 0.50 0.47 -- 0.43 -- -- -- --  CREATININE 1.05 -- 1.09 -- 1.15 -- -- --  CKTOTAL -- -- -- -- -- 28 35 43  CKMB -- -- -- -- -- 1.8 1.9 1.9  TROPONINI -- -- -- -- -- <0.30 <0.30 <0.30   Estimated Creatinine Clearance: 54.9 ml/min (by C-G formula based on Cr of 1.05).   Medications:  Scheduled:    . antiseptic oral rinse  15 mL Mouth Rinse q12n4p  . chlorhexidine  15 mL Mouth Rinse BID  . insulin aspart  0-9 Units Subcutaneous Q4H  . lip balm  1 application Topical BID  . magnesium sulfate 1 - 4 g bolus IVPB  1 g Intravenous Once  . metoprolol  5 mg Intravenous Q6H  . metronidazole  500 mg Intravenous Q6H  . pantoprazole (PROTONIX) IV  40 mg Intravenous Q24H  . piperacillin-tazobactam (ZOSYN)  IV  3.375 g Intravenous Q8H  . potassium chloride  10 mEq Intravenous Q1 Hr x 6  . potassium chloride  10 mEq Intravenous Q1 Hr x 3  . sodium chloride  10-40 mL Intracatheter Q12H  . sodium phosphate  Dextrose 5% IVPB  10 mmol Intravenous Once  . vancomycin  1,000 mg Intravenous Q12H  . DISCONTD: vancomycin  1,000 mg Intravenous Q24H    Assessment:  78 yo M, admit on chronic warfarin for history of VTE, s/p ORIF on 2/22, currently on heparin drip for  VTE, Afib, and CVA   Heparin level at upper end of goal and rising steadily  CBC stable, no bleeding reported  Goal of Therapy:  Heparin level 0.3-0.5   Plan:   Decrease heparin empirically 1550 units/hr  F/U heparin level & CBC in am   Loralee Pacas, PharmD, BCPS Pager: [phone] 07/15/2011,7:08 AM

## 2011-07-15 NOTE — Progress Notes (Signed)
ANTIBIOTIC CONSULT NOTE - FOLLOW UP  Pharmacy Consult for vancomycin Indication: PNA  No Known Allergies  Patient Measurements: Height: 5\' 6"  (167.6 cm) Weight: 153 lb 7 oz (69.6 kg) IBW/kg (Calculated) : 63.8  Adjusted Body Weight:   Vital Signs: Temp: 97.5 F (36.4 C) (03/13 0000) Temp src: Oral (03/13 0000) BP: 142/84 mmHg (03/13 0300) Pulse Rate: 119  (03/13 0300) Intake/Output from previous day: 03/12 0701 - 03/13 0700 In: 3338 [I.V.:570; NG/GT:100; IV Piggyback:925; TPN:1425] Out: 1525 [Urine:1425; Emesis/NG output:100] Intake/Output from this shift: Total I/O In: 1666.5 [I.V.:378; ZOXWR:604; NG/GT:100; IV Piggyback:362.5; TPN:600] Out: -   Labs:  Basename 07/14/11 1449 07/14/11 0432 07/13/11 0425 07/12/11 0520  WBC -- 8.2 10.2 9.9  HGB -- 9.2* 8.2* 8.7*  PLT -- 211 211 226  LABCREA -- -- -- --  CREATININE 1.09 1.15 1.26 --   Estimated Creatinine Clearance: 52.8 ml/min (by C-G formula based on Cr of 1.09).  Basename 07/14/11 2330  VANCOTROUGH 8.7*  VANCOPEAK --  Drue Dun --  GENTTROUGH --  GENTPEAK --  GENTRANDOM --  TOBRATROUGH --  TOBRAPEAK --  TOBRARND --  AMIKACINPEAK --  AMIKACINTROU --  AMIKACIN --     Microbiology: Recent Results (from the past 720 hour(s))  MRSA PCR SCREENING     Status: Normal   Collection Time   06/26/11 12:10 PM      Component Value Range Status Comment   MRSA by PCR NEGATIVE  NEGATIVE  Final   MRSA PCR SCREENING     Status: Normal   Collection Time   07/03/11  4:21 AM      Component Value Range Status Comment   MRSA by PCR NEGATIVE  NEGATIVE  Final   CULTURE, RESPIRATORY     Status: Normal   Collection Time   07/10/11  5:00 PM      Component Value Range Status Comment   Specimen Description TRACHEAL ASPIRATE   Final    Special Requests NONE   Final    Gram Stain     Final    Value: NO WBC SEEN     RARE SQUAMOUS EPITHELIAL CELLS PRESENT     NO ORGANISMS SEEN   Culture Non-Pathogenic Oropharyngeal-type Flora  Isolated.   Final    Report Status 07/13/2011 FINAL   Final   CLOSTRIDIUM DIFFICILE BY PCR     Status: Abnormal   Collection Time   07/12/11  6:18 AM      Component Value Range Status Comment   C difficile by pcr POSITIVE (*) NEGATIVE  Final     Anti-infectives     Start     Dose/Rate Route Frequency Ordered Stop   07/15/11 0900   vancomycin (VANCOCIN) IVPB 1000 mg/200 mL premix        1,000 mg 200 mL/hr over 60 Minutes Intravenous Every 12 hours 07/15/11 0407     07/12/11 1800   metroNIDAZOLE (FLAGYL) IVPB 500 mg        500 mg 100 mL/hr over 60 Minutes Intravenous 4 times per day 07/12/11 1645     07/12/11 1530   vancomycin (VANCOCIN) 500 mg in sodium chloride irrigation 0.9 % 100 mL enema        500 mg 66.7 mL/hr over 90 Minutes Rectal 4 times per day 07/12/11 1450 07/13/11 0108   07/11/11 2359   vancomycin (VANCOCIN) IVPB 1000 mg/200 mL premix  Status:  Discontinued        1,000 mg 200 mL/hr over 60 Minutes  Intravenous Every 24 hours 07/11/11 0807 07/15/11 0407   07/10/11 1800   vancomycin (VANCOCIN) 500 mg in sodium chloride 0.9 % 100 mL IVPB  Status:  Discontinued        500 mg 100 mL/hr over 60 Minutes Intravenous Every 12 hours 07/10/11 1641 07/11/11 0806   07/10/11 1800  piperacillin-tazobactam (ZOSYN) IVPB 3.375 g       3.375 g 12.5 mL/hr over 240 Minutes Intravenous Every 8 hours 07/10/11 1708     07/10/11 0800   ertapenem (INVANZ) 1 g in sodium chloride 0.9 % 50 mL IVPB  Status:  Discontinued        1 g 100 mL/hr over 30 Minutes Intravenous Every 24 hours 07/10/11 0739 07/10/11 1659   06/26/11 2000   ceFAZolin (ANCEF) IVPB 1 g/50 mL premix        1 g 100 mL/hr over 30 Minutes Intravenous Every 6 hours 06/26/11 1658 06/27/11 0857          Assessment: Patient with low vancomycin level (8.7) and improving renal function.  Goal of Therapy:  Vancomycin trough level 15-20 mcg/ml  Plan:  Measure antibiotic drug levels at steady state Follow up culture  results Change vancomycin to 1gm iv q12hr  Aleene Davidson Crowford 07/15/2011,4:08 AM

## 2011-07-15 NOTE — Progress Notes (Addendum)
PARENTERAL NUTRITION CONSULT NOTE - FOLLOW UP  Pharmacy Consult for TNA Indication: SBO  No Known Allergies  Patient Measurements: Height: 5\' 6"  (167.6 cm) Weight: 157 lb 13.6 oz (71.6 kg) IBW/kg (Calculated) : 63.8  Usual Weight: unable to assess  Vital Signs: Temp: 97.5 F (36.4 C) (03/13 0400) Temp src: Oral (03/13 0400) BP: 149/84 mmHg (03/13 0500) Pulse Rate: 110  (03/13 0600)  Intake/Output from previous day: 03/12 0701 - 03/13 0700 In: 3986 [I.V.:768; NG/GT:200; IV Piggyback:1050; TPN:1650] Out: 2425 [Urine:2225; Emesis/NG output:100; Stool:100] 3/12 Net +1666ml  Labs:  Basename 07/15/11 0500 07/14/11 0432 07/13/11 0425  WBC 8.2 8.2 10.2  HGB 9.9* 9.2* 8.2*  HCT 29.1* 27.4* 24.4*  PLT 242 211 211  APTT -- -- --  INR 1.42 1.23 1.21     Basename 07/15/11 0500 07/14/11 1449 07/14/11 0807 07/14/11 0433 07/14/11 0432 07/13/11 0425  NA 134* 136 -- -- 136 --  K 3.0* 3.1* -- -- 2.6* --  CL 105 108 -- -- 107 --  CO2 22 21 -- -- 21 --  GLUCOSE 226* 199* -- -- 178* --  BUN 14 14 -- -- 14 --  CREATININE 1.05 1.09 -- -- 1.15 --  LABCREA -- -- -- -- -- --  CREAT24HRUR -- -- -- -- -- --  CALCIUM 7.6* 7.7* -- -- 7.7* --  MG 1.7 -- 1.6 -- -- 1.8  PHOS 2.6 -- -- -- -- 2.1*  PROT -- -- -- -- -- 4.6*  ALBUMIN -- -- -- -- -- 1.2*  AST -- -- -- -- -- 13  ALT -- -- -- -- -- 12  ALKPHOS -- -- -- -- -- 68  BILITOT -- -- -- -- -- 0.3  BILIDIR -- -- -- -- -- --  IBILI -- -- -- -- -- --  PREALBUMIN -- -- -- -- -- <3.0*  TRIG -- -- -- 61 -- 125  CHOLHDL -- -- -- 3.9 -- --  CHOL -- -- -- 54 -- 64  3/12 Corrected calcium = 9.84 Estimated Creatinine Clearance: 54.9 ml/min (by C-G formula based on Cr of 1.05).   CBGs: 187, 206, 190, 196, 200, 220  ( 14 units SSI required/24hr)  Medications:  Scheduled:     . antiseptic oral rinse  15 mL Mouth Rinse q12n4p  . chlorhexidine  15 mL Mouth Rinse BID  . insulin aspart  0-9 Units Subcutaneous Q4H  . lip balm  1 application  Topical BID  . magnesium sulfate 1 - 4 g bolus IVPB  1 g Intravenous Once  . metoprolol  5 mg Intravenous Q6H  . metronidazole  500 mg Intravenous Q6H  . pantoprazole (PROTONIX) IV  40 mg Intravenous Q24H  . potassium chloride  10 mEq Intravenous Q1 Hr x 5  . potassium chloride  10 mEq Intravenous Q1 Hr x 6  . potassium chloride  10 mEq Intravenous Q1 Hr x 3  . sodium chloride  10-40 mL Intracatheter Q12H  . sodium phosphate  Dextrose 5% IVPB  10 mmol Intravenous Once  . vancomycin  1,000 mg Intravenous Q12H  . DISCONTD: piperacillin-tazobactam (ZOSYN)  IV  3.375 g Intravenous Q8H  . DISCONTD: vancomycin  1,000 mg Intravenous Q24H   Infusions:  NS with 30Kcl at 1ml/hr  Heparin 1550 units/hr  Current Nutrition:  Clinimix E5/20 at 75 ml/hr  NPO  Assessment:  77 YOM s/p ORIF 2/22, GI bleed 2/28 thought to be 2/2 diverticular disease, CT 3/4 consistent with SBO. NG tube  with significant output, plan to start TNA. Last meal recorded 2/28.  POD#5 LOA, serosal/enterotomy repair, fecal disimpaction  Tolerating TNA at goal rate of 75 ml/hr  Potassium remains low, despite KCl 60 mEq on 3/11 per MD; KCl 60 mEq IV per MD on 3/12, KCL added to IVF 3/12.  Magnesium slightly low despite 1gm 3/12 (1.6 on 3/12, 1.7 today)  Phosphorus wnl after sodium Phosphate 10 mMol 3/12  (2.1 on 3/11, 2.6 today)   Baseline prealbumin 5.4, on 3/11 it was < 3; Lipids have remained normal  CBGs >200 again despite addition of insulin to TNA  Scr elevated post op, but improving daily  Nutritional Goals:   1850-2110 kCal, 79-92 grams of protein per day  Clinimix E5/20 23ml/hr + 20% Lipids 57ml/hr will provide 90gm protein, 1584 kcal on non-lipid days, 2064 kcal on lipid days, average 1790 kcal/week  Plan:   Continue Clinimix E5/20 at goal rate of 75 ml/hr Fat emulsion 10 ml/hr (MWF only due to ongoing shortage). Standard multivitamins and trace elements (MWF only due to ongoing shortage). IVF per  MD KCL x 5 runs ordered per MD Increase 12 units insulin per liter bag of TNA; Continue CBG/SSI q4h Follow up CBGs, potassium, magnesium, and phosphate levels TNA lab panels on Mondays & Thursdays.   Loralee Pacas, PharmD, BCPS Pager: [phone] 07/15/2011 7:36 AM

## 2011-07-16 LAB — COMPREHENSIVE METABOLIC PANEL
Alkaline Phosphatase: 66 U/L (ref 39–117)
CO2: 23 mEq/L (ref 19–32)
Chloride: 104 mEq/L (ref 96–112)
GFR calc Af Amer: >90 mL/min (ref >90)
Potassium: 3 mEq/L — ABNORMAL LOW (ref 3.5–5.1)
Sodium: 133 mEq/L — ABNORMAL LOW (ref 135–145)
Total Protein: 5.1 g/dL — ABNORMAL LOW (ref 6.0–8.3)

## 2011-07-16 LAB — PHOSPHORUS: Phosphorus: 2.6 mg/dL (ref 2.3–4.6)

## 2011-07-16 LAB — HEPARIN LEVEL (UNFRACTIONATED)
Heparin Unfractionated: 0.74 IU/mL — ABNORMAL HIGH (ref 0.30–0.70)
Heparin Unfractionated: 0.58 IU/mL (ref 0.30–0.70)

## 2011-07-16 LAB — PROTIME-INR
INR: 1.53 — ABNORMAL HIGH (ref 0.00–1.49)
Prothrombin Time: 18.7 seconds — ABNORMAL HIGH (ref 11.6–15.2)
Prothrombin Time: 18.4 seconds — ABNORMAL HIGH (ref 11.6–15.2)

## 2011-07-16 LAB — GLUCOSE, CAPILLARY
Glucose-Capillary: 273 mg/dL — ABNORMAL HIGH (ref 70–99)
Glucose-Capillary: 230 mg/dL — ABNORMAL HIGH (ref 70–99)

## 2011-07-16 LAB — MAGNESIUM: Magnesium: 1.5 mg/dL (ref 1.5–2.5)

## 2011-07-16 MED ORDER — POTASSIUM CHLORIDE 10 MEQ/100ML IV SOLN
10.0000 meq | INTRAVENOUS | Status: AC
  Administered 2011-07-16 (×4): 10 meq via INTRAVENOUS
  Filled 2011-07-16 (×4): qty 100

## 2011-07-16 MED ORDER — HEPARIN (PORCINE) IN NACL 100-0.45 UNIT/ML-% IJ SOLN
1300.0000 [IU]/h | INTRAMUSCULAR | Status: DC
  Administered 2011-07-17: 1300 [IU]/h via INTRAVENOUS
  Filled 2011-07-16 (×3): qty 250

## 2011-07-16 MED ORDER — POTASSIUM CHLORIDE 10 MEQ/50ML IV SOLN
10.0000 meq | INTRAVENOUS | Status: AC
  Administered 2011-07-16 (×5): 10 meq via INTRAVENOUS
  Filled 2011-07-16 (×5): qty 50

## 2011-07-16 MED ORDER — INSULIN REGULAR HUMAN 100 UNIT/ML IJ SOLN
INTRAVENOUS | Status: AC
  Administered 2011-07-16: 17:00:00 via INTRAVENOUS
  Filled 2011-07-16: qty 2000

## 2011-07-16 NOTE — Progress Notes (Signed)
Subjective: No new problems overnight, potassium remains low. Ileus versus. Review of the orders shows that echo has not been done, this is been discussed with nursing staff. Patient's family updated. Patient still remains motivated to participate in therapy. We have discussed the need to increase out of bed to chair  Objective: Vital signs in last 24 hours: Temp:  [97.6 F (36.4 C)-98.7 F (37.1 C)] 97.6 F (36.4 C) (03/14 0000) Pulse Rate:  [110-128] 110  (03/14 0700) Resp:  [19-25] 21  (03/14 0700) BP: (142-173)/(83-95) 164/91 mmHg (03/14 0700) SpO2:  [97 %-98 %] 98 % (03/14 0700) Weight change:  Last BM Date: 07/16/11  Intake/Output from previous day: 03/13 0701 - 03/14 0700 In: 3650 [I.V.:400; IV Piggyback:1300; TPN:1950] Out: 3350 [Urine:2600; Emesis/NG output:150; Stool:600] Intake/Output this shift: Total I/O In: 151 [I.V.:16; IV Piggyback:50; TPN:85] Out: 400 [Stool:400]  General appearance: alert and cooperative Resp: clear to auscultation bilaterally Cardio: tachycardia but regular GI: distended, hypoechoic bowel sounds Extremities: extremities normal, atraumatic, no cyanosis or edema Neuro left hemiparesis Lab Results:  Results for orders placed during the hospital encounter of 06/24/11 (from the past 24 hour(s))  GLUCOSE, CAPILLARY     Status: Abnormal   Collection Time   07/15/11 11:30 AM      Component Value Range   Glucose-Capillary 254 (*) 70 - 99 (mg/dL)   Comment 1 Documented in Chart     Comment 2 Notify RN    GLUCOSE, CAPILLARY     Status: Abnormal   Collection Time   07/15/11  3:41 PM      Component Value Range   Glucose-Capillary 252 (*) 70 - 99 (mg/dL)   Comment 1 Documented in Chart     Comment 2 Notify RN    GLUCOSE, CAPILLARY     Status: Abnormal   Collection Time   07/15/11  7:52 PM      Component Value Range   Glucose-Capillary 195 (*) 70 - 99 (mg/dL)   Comment 1 Documented in Chart     Comment 2 Notify RN    GLUCOSE, CAPILLARY      Status: Abnormal   Collection Time   07/16/11 12:38 AM      Component Value Range   Glucose-Capillary 208 (*) 70 - 99 (mg/dL)   Comment 1 Notify RN    GLUCOSE, CAPILLARY     Status: Abnormal   Collection Time   07/16/11  3:31 AM      Component Value Range   Glucose-Capillary 197 (*) 70 - 99 (mg/dL)   Comment 1 Notify RN    PROTIME-INR     Status: Abnormal   Collection Time   07/16/11  5:30 AM      Component Value Range   Prothrombin Time 18.7 (*) 11.6 - 15.2 (seconds)   INR 1.53 (*) 0.00 - 1.49   HEPARIN LEVEL (UNFRACTIONATED)     Status: Normal   Collection Time   07/16/11  5:30 AM      Component Value Range   Heparin Unfractionated 0.65  0.30 - 0.70 (IU/mL)  COMPREHENSIVE METABOLIC PANEL     Status: Abnormal   Collection Time   07/16/11  7:30 AM      Component Value Range   Sodium 133 (*) 135 - 145 (mEq/L)   Potassium 3.0 (*) 3.5 - 5.1 (mEq/L)   Chloride 104  96 - 112 (mEq/L)   CO2 23  19 - 32 (mEq/L)   Glucose, Bld 206 (*) 70 - 99 (mg/dL)  BUN 15  6 - 23 (mg/dL)   Creatinine, Ser 7.84  0.50 - 1.35 (mg/dL)   Calcium 7.5 (*) 8.4 - 10.5 (mg/dL)   Total Protein 5.1 (*) 6.0 - 8.3 (g/dL)   Albumin 1.4 (*) 3.5 - 5.2 (g/dL)   AST 13  0 - 37 (U/L)   ALT 8  0 - 53 (U/L)   Alkaline Phosphatase 66  39 - 117 (U/L)   Total Bilirubin 0.2 (*) 0.3 - 1.2 (mg/dL)   GFR calc non Af Amer 79 (*) >90 (mL/min)   GFR calc Af Amer >90  >90 (mL/min)  HEPARIN LEVEL (UNFRACTIONATED)     Status: Abnormal   Collection Time   07/16/11  7:30 AM      Component Value Range   Heparin Unfractionated 0.74 (*) 0.30 - 0.70 (IU/mL)  MAGNESIUM     Status: Normal   Collection Time   07/16/11  7:30 AM      Component Value Range   Magnesium 1.5  1.5 - 2.5 (mg/dL)  PHOSPHORUS     Status: Normal   Collection Time   07/16/11  7:30 AM      Component Value Range   Phosphorus 2.6  2.3 - 4.6 (mg/dL)  PROTIME-INR     Status: Abnormal   Collection Time   07/16/11  7:30 AM      Component Value Range   Prothrombin  Time 18.4 (*) 11.6 - 15.2 (seconds)   INR 1.50 (*) 0.00 - 1.49   GLUCOSE, CAPILLARY     Status: Abnormal   Collection Time   07/16/11  7:48 AM      Component Value Range   Glucose-Capillary 205 (*) 70 - 99 (mg/dL)   Comment 1 Documented in Chart     Comment 2 Notify RN        Studies/Results: No results found.  Medications:  Scheduled:   . antiseptic oral rinse  15 mL Mouth Rinse q12n4p  . chlorhexidine  15 mL Mouth Rinse BID  . insulin aspart  0-15 Units Subcutaneous Q4H  . lip balm  1 application Topical BID  . metoprolol  5 mg Intravenous Q6H  . metronidazole  500 mg Intravenous Q6H  . pantoprazole (PROTONIX) IV  40 mg Intravenous Q24H  . potassium chloride  10 mEq Intravenous Q1 Hr x 5  . potassium chloride  10 mEq Intravenous Q1 Hr x 5  . sodium chloride  10-40 mL Intracatheter Q12H  . vancomycin  1,000 mg Intravenous Q12H  . DISCONTD: insulin aspart  0-9 Units Subcutaneous Q4H   Continuous:   . fat emulsion 250 mL (07/15/11 1723)  . heparin 1,450 Units/hr (07/16/11 0821)  . 0.9 % sodium chloride with kcl 50 mL/hr at 07/14/11 1448  . TPN (CLINIMIX) +/- additives 75 mL/hr at 07/14/11 1738  . TPN (CLINIMIX) +/- additives 75 mL/hr at 07/15/11 1722    Assessment/Plan: CVA with left hemiparesis, continue heparin continue recommendations per neurology. CT angiogram the neck shows occluded right ICA,  Status post lysis of adhesions, patient still of ileus  Hypokalemia, recommend pharmacy increase potassium in TPN, We will continue some potassium in IV fluids and potassium runs  Acute on chronic kidney disease, Resolved  History of PE. Recent ultrasound shows chronic bilateral DVT  Status post GI bleed which required reversal of anticoagulation, EGD without definitive source, previous colonoscopy only revealed diverticulosis.  Anemia multifactorial , Hemoglobin stable no recurrent bleeding  Clostridium difficile, on Flagyl and vancomycin  Ileus management  per surgery    Elevated CBGs, insulin has  been added to TPN  Mildly elevated BNP, Echo  pending, currently patient is oxygenating well.  Malnutrition continue TPN  Decondition continue physical therapy    LOS: 22 days   [name] 07/16/2011, 8:21 AM

## 2011-07-16 NOTE — Progress Notes (Signed)
PARENTERAL NUTRITION CONSULT NOTE - FOLLOW UP  Pharmacy Consult for TNA Indication: SBO  No Known Allergies  Patient Measurements: Height: 5\' 6"  (167.6 cm) Weight: 157 lb 13.6 oz (71.6 kg) IBW/kg (Calculated) : 63.8  Usual Weight: unable to assess  Vital Signs: Temp: 99.1 F (37.3 C) (03/14 0800) Temp src: Axillary (03/14 0800) BP: 164/91 mmHg (03/14 0700) Pulse Rate: 110  (03/14 0700)  Intake/Output from previous day: 03/13 0701 - 03/14 0700 In: 3650 [I.V.:400; IV Piggyback:1300; TPN:1950] Out: 3350 [Urine:2600; Emesis/NG output:150; Stool:600] 3/13 Net +131ml  Labs:  Basename 07/16/11 0730 07/16/11 0530 07/15/11 0500 07/14/11 0432  WBC -- -- 8.2 8.2  HGB -- -- 9.9* 9.2*  HCT -- -- 29.1* 27.4*  PLT -- -- 242 211  APTT -- -- -- --  INR 1.50* 1.53* 1.42 --     Basename 07/16/11 0730 07/15/11 0500 07/14/11 1449 07/14/11 0807 07/14/11 0433  NA 133* 134* 136 -- --  K 3.0* 3.0* 3.1* -- --  CL 104 105 108 -- --  CO2 23 22 21  -- --  GLUCOSE 206* 226* 199* -- --  BUN 15 14 14  -- --  CREATININE 0.97 1.05 1.09 -- --  LABCREA -- -- -- -- --  CREAT24HRUR -- -- -- -- --  CALCIUM 7.5* 7.6* 7.7* -- --  MG 1.5 1.7 -- 1.6 --  PHOS 2.6 2.6 -- -- --  PROT 5.1* -- -- -- --  ALBUMIN 1.4* -- -- -- --  AST 13 -- -- -- --  ALT 8 -- -- -- --  ALKPHOS 66 -- -- -- --  BILITOT 0.2* -- -- -- --  BILIDIR -- -- -- -- --  IBILI -- -- -- -- --  PREALBUMIN -- -- -- -- --  TRIG -- -- -- -- 61  CHOLHDL -- -- -- -- 3.9  CHOL -- -- -- -- 54  Corrected calcium = 9.58 Estimated Creatinine Clearance: 59.4 ml/min (by C-G formula based on Cr of 0.97).   CBGs: 254, 252, 195, 208, 197, 205 (34 units SSI required/24hr)  Medications:  Scheduled:     . antiseptic oral rinse  15 mL Mouth Rinse q12n4p  . chlorhexidine  15 mL Mouth Rinse BID  . insulin aspart  0-15 Units Subcutaneous Q4H  . lip balm  1 application Topical BID  . metoprolol  5 mg Intravenous Q6H  . metronidazole  500 mg  Intravenous Q6H  . pantoprazole (PROTONIX) IV  40 mg Intravenous Q24H  . potassium chloride  10 mEq Intravenous Q1 Hr x 5  . potassium chloride  10 mEq Intravenous Q1 Hr x 5  . sodium chloride  10-40 mL Intracatheter Q12H  . vancomycin  1,000 mg Intravenous Q12H  . DISCONTD: insulin aspart  0-9 Units Subcutaneous Q4H   Infusions:  NS with 30Kcl at 49ml/hr  Heparin 1450 units/hr  Current Nutrition:  Clinimix E5/20 at 75 ml/hr  NPO  Assessment:  77 YOM s/p ORIF 2/22, GI bleed 2/28 thought to be 2/2 diverticular disease, CT 3/4 consistent with SBO. NG tube with significant output, plan to start TNA. Last meal recorded 2/28.  POD#6 LOA, serosal/enterotomy repair, fecal disimpaction  Tolerating TNA at goal rate of 75 ml/hr  Potassium remains low, despite multiple bolus replacements and addition of KCL to IVF 3/12.  Insulin in TNA may be contributing  Magnesium slightly low despite 1gm 3/12 (1.7 on 3/13, 1.5 today)  Phosphorus wnl after sodium Phosphate 10 mMol 3/12  (2.6 on  3/13 and today).   Baseline prealbumin 5.4, on 3/11 it was < 3; Lipids have remained normal  CBGs >200 again despite increasing insulin in TNA and increasing SSI to moderate scale.  Scr elevated post op, but improving daily  Nutritional Goals:   1850-2110 kCal, 79-92 grams of protein per day  Clinimix E5/20 36ml/hr + 20% Lipids 46ml/hr will provide 90gm protein, 1584 kcal on non-lipid days, 2064 kcal on lipid days, average 1790 kcal/week  Plan:   Continue Clinimix E5/20 at goal rate of 75 ml/hr Fat emulsion 10 ml/hr (MWF only due to ongoing shortage). Standard multivitamins and trace elements (MWF only due to ongoing shortage). IVF per MD KCL x 5 runs ordered per MD.  Unfortunately, cannot adjust amount of K+ in TNA due to available Clinimix formulations.  Will need to rely on bolus and IVF supplementation. Increase 18 units insulin per liter bag of TNA; Continue CBG/SSI q4h Follow up CBGs,  potassium, magnesium, and phosphate levels TNA lab panels on Mondays & Thursdays.   Loralee Pacas, PharmD, BCPS Pager: [phone] 07/16/2011 9:10 AM

## 2011-07-16 NOTE — Progress Notes (Signed)
Physical Therapy Treatment Patient Details Name: [name] MRN: [id number] DOB: Dec 23, 1933 Today's Date: 07/16/2011  PT Assessment/Plan  PT - Assessment/Plan Comments on Treatment Session: pt progressing slowly, participates well although globally deconditioned at this point; will benefit from CIR vs STSNF PT Plan: Discharge plan remains appropriate;Frequency remains appropriate PT Frequency: Min 3X/week Recommendations for Other Services: Rehab consult Follow Up Recommendations: Skilled nursing facility;Inpatient Rehab;LTACH Equipment Recommended: Defer to next venue PT Goals  Acute Rehab PT Goals Time For Goal Achievement: 2 weeks Pt will Roll Supine to Right Side: with mod assist;with rail PT Goal: Rolling Supine to Right Side - Progress: Progressing toward goal Pt will go Supine/Side to Sit: with mod assist;with rail PT Goal: Supine/Side to Sit - Progress: Goal set today Pt will Sit at Edge of Bed: 6-10 min;with min assist;with unilateral upper extremity support;with cues (comment type and amount) PT Goal: Sit at Edge Of Bed - Progress: Goal set today  PT Treatment Precautions/Restrictions  Precautions Precautions: Fall Required Braces or Orthoses: No Restrictions Weight Bearing Restrictions: No RLE Weight Bearing: Weight bearing as tolerated Other Position/Activity Restrictions: Pt aware he is WBAT Mobility (including Balance) Bed Mobility Rolling Left: 1: +2 Total assist Rolling Left Details (indicate cue type and reason): Pt=15% Utilized bedpad for assistance. Multi-modal cues for encouragement, initiation of task, technique, head positioning. Supine to Sit: 1: +2 Total assist;HOB elevated (Comment degrees) Supine to Sit Details (indicate cue type and reason): pt10%; +2 for assist, safety, lines Transfers Transfers: No (with sky) Transfer via Lift Equipment: [place] Ambulation/Gait Ambulation/Gait: No  Static Sitting Balance Static Sitting - Balance Support: Right  upper extremity supported;Feet supported Static Sitting - Level of Assistance: 1: +2 Total assist Static Sitting - Comment/# of Minutes: pt 15%; +2 for balance safety and lines; wt shifting with skypad placed in sitting  Exercise  General Exercises - Upper Extremity Shoulder Flexion: PROM;AROM;Both;10 reps;Supine Elbow Flexion: AAROM;PROM;Left;10 reps;Supine Elbow Extension: AAROM;PROM;Left;10 reps;Supine General Exercises - Lower Extremity Ankle Circles/Pumps: PROM;AROM;Both;10 reps;Supine Quad Sets: AROM;Right;10 reps;Supine Heel Slides: PROM;AAROM;Both;10 reps;Supine Other Exercises Other Exercises: cervical rotation and flexion Other Exercises: hand flex/ext fingers- AROM R UE, PROM L UE, 10 reps supine End of Session PT - End of Session Activity Tolerance: Patient tolerated treatment well Patient left: in chair;with call bell in reach (RN present) Nurse Communication: Need for lift equipment General Behavior During Session: [facility] for tasks performed Cognition: [facility] for tasks performed  [facility] 07/16/2011, 12:31 PM

## 2011-07-16 NOTE — Progress Notes (Signed)
ANTICOAGULATION CONSULT NOTE - Follow Up Consult  Pharmacy Consult for Heparin Indication: VTE, Afib, and r/o CVA  No Known Allergies  Patient Measurements: Height: 5\' 6"  (167.6 cm) Weight: 157 lb 13.6 oz (71.6 kg) IBW/kg (Calculated) : 63.8   Vital Signs: Temp: 97.4 F (36.3 C) (03/14 1200) Temp src: Oral (03/14 1200) BP: 146/78 mmHg (03/14 1500) Pulse Rate: 126  (03/14 1500)  Labs:  Basename 07/16/11 0730 07/16/11 0530 07/15/11 0500 07/14/11 1449 07/14/11 0432  HGB -- -- 9.9* -- 9.2*  HCT -- -- 29.1* -- 27.4*  PLT -- -- 242 -- 211  APTT -- -- -- -- --  LABPROT 18.4* 18.7* 17.6* -- --  INR 1.50* 1.53* 1.42 -- --  HEPARINUNFRC 0.74* 0.65 0.50 -- --  CREATININE 0.97 -- 1.05 1.09 --  CKTOTAL -- -- -- -- --  CKMB -- -- -- -- --  TROPONINI -- -- -- -- --   Estimated Creatinine Clearance: 59.4 ml/min (by C-G formula based on Cr of 0.97).   Medications:  Scheduled:     . antiseptic oral rinse  15 mL Mouth Rinse q12n4p  . chlorhexidine  15 mL Mouth Rinse BID  . insulin aspart  0-15 Units Subcutaneous Q4H  . lip balm  1 application Topical BID  . metoprolol  5 mg Intravenous Q6H  . metronidazole  500 mg Intravenous Q6H  . pantoprazole (PROTONIX) IV  40 mg Intravenous Q24H  . potassium chloride  10 mEq Intravenous Q1 Hr x 4  . potassium chloride  10 mEq Intravenous Q1 Hr x 5  . sodium chloride  10-40 mL Intracatheter Q12H  . DISCONTD: vancomycin  1,000 mg Intravenous Q12H    Assessment:  78 yo M, admit on chronic warfarin for history of VTE, s/p ORIF on 2/22, ex-lap with LOA, enterotomy 3/8, currently on heparin drip for PE, Afib, and CVA   Heparin infusing at 1450 units/hr, no issues or complications per RN  Heparin level is still slightly supratherapeutic at 0.58  Goal of Therapy:  Heparin level 0.3-0.5   Plan:   Reduce heparin infusion to 1350 units/hr (13.5 ml/hr)  Recheck level in 8hrs  F/U heparin level & CBC in am  Per neurology note, should wait  5-10 days after CVA before starting Coumadin (if we are going to use) due to risk of conversion of sizeable infarct.   Lynann Beaver PharmD, BCPS Pager [phone] 07/16/2011 4:34 PM

## 2011-07-16 NOTE — Progress Notes (Signed)
If discussed at Long Length of Stay Meetings, dates discussed:   07/15/2011   07/16/2011 Raynelle Bring BSN CCM [phone] PT remains in stepdown unit/currently with left hemiparesis following exploratory lap 07/10/11/. Current treatment NG tube-ileus, iv FLAGYL, IV PROTONIX, TNA, IV HEPARIN, IV VANC. Current plan SNF; CM WILL FOLLOW along with CSW

## 2011-07-16 NOTE — Progress Notes (Signed)
ANTICOAGULATION CONSULT NOTE - Follow Up Consult  Pharmacy Consult for Heparin Indication: VTE, Afib, and r/o CVA  No Known Allergies  Patient Measurements: Height: 5\' 6"  (167.6 cm) Weight: 157 lb 13.6 oz (71.6 kg) IBW/kg (Calculated) : 63.8   Vital Signs: Temp: 97.6 F (36.4 C) (03/14 0000) Temp src: Oral (03/14 0000) BP: 154/83 mmHg (03/14 0500) Pulse Rate: 121  (03/14 0500)  Labs:  Basename 07/16/11 0530 07/15/11 0500 07/14/11 1915 07/14/11 1449 07/14/11 0432 07/13/11 1320  HGB -- 9.9* -- -- 9.2* --  HCT -- 29.1* -- -- 27.4* --  PLT -- 242 -- -- 211 --  APTT -- -- -- -- -- --  LABPROT 18.7* 17.6* -- -- 15.8* --  INR 1.53* 1.42 -- -- 1.23 --  HEPARINUNFRC 0.65 0.50 0.47 -- -- --  CREATININE -- 1.05 -- 1.09 1.15 --  CKTOTAL -- -- -- -- -- 28  CKMB -- -- -- -- -- 1.8  TROPONINI -- -- -- -- -- <0.30   Estimated Creatinine Clearance: 54.9 ml/min (by C-G formula based on Cr of 1.05).   Medications:  Scheduled:     . antiseptic oral rinse  15 mL Mouth Rinse q12n4p  . chlorhexidine  15 mL Mouth Rinse BID  . insulin aspart  0-15 Units Subcutaneous Q4H  . lip balm  1 application Topical BID  . metoprolol  5 mg Intravenous Q6H  . metronidazole  500 mg Intravenous Q6H  . pantoprazole (PROTONIX) IV  40 mg Intravenous Q24H  . potassium chloride  10 mEq Intravenous Q1 Hr x 5  . potassium chloride  10 mEq Intravenous Q1 Hr x 5  . sodium chloride  10-40 mL Intracatheter Q12H  . vancomycin  1,000 mg Intravenous Q12H  . DISCONTD: insulin aspart  0-9 Units Subcutaneous Q4H  . DISCONTD: piperacillin-tazobactam (ZOSYN)  IV  3.375 g Intravenous Q8H    Assessment:  78 yo M, admit on chronic warfarin for history of VTE, s/p ORIF on 2/22, ex-lap with LOA, enterotomy 3/8, currently on heparin drip for PE, Afib, and CVA   Heparin level > goal, heparin rate was not decreased yesterday despite order to drop to 1550 units/hr  CBC stable, no bleeding reported  Goal of Therapy:    Heparin level 0.3-0.5   Plan:   Decrease heparin 1450 units/hr  Recheck level in 8hrs  F/U heparin level & CBC in am  Per neurology note, should wait 5-10 days after CVA before starting Coumadin (if we are going to use) due to risk of conversion of sizeable infarct.    Loralee Pacas, PharmD, BCPS Pager: [phone] 07/16/2011,7:05 AM

## 2011-07-16 NOTE — Progress Notes (Signed)
6 Days Post-Op  Subjective: Comfortable.  Awake and alert.  Objective: Vital signs in last 24 hours: Temp:  [97.6 F (36.4 C)-98.7 F (37.1 C)] 97.6 F (36.4 C) (03/14 0000) Pulse Rate:  [112-128] 121  (03/14 0500) Resp:  [19-25] 20  (03/14 0500) BP: (142-173)/(83-95) 154/83 mmHg (03/14 0500) SpO2:  [97 %-98 %] 98 % (03/14 0500) Last BM Date: 07/13/11  Intake/Output from previous day: 03/13 0701 - 03/14 0700 In: 3549 [I.V.:384; IV Piggyback:1300; TPN:1865] Out: 2600 [Urine:2600] Intake/Output this shift: Total I/O In: 1436 [I.V.:176; IV Piggyback:400; TPN:860] Out: 1050 [Urine:1050]  PE: Abd-slightly firm and distended, hypoactive BS, incisions clean, rare bowel sound   Lab Results:   Basename 07/15/11 0500 07/14/11 0432  WBC 8.2 8.2  HGB 9.9* 9.2*  HCT 29.1* 27.4*  PLT 242 211   BMET  Basename 07/15/11 0500 07/14/11 1449  NA 134* 136  K 3.0* 3.1*  CL 105 108  CO2 22 21  GLUCOSE 226* 199*  BUN 14 14  CREATININE 1.05 1.09  CALCIUM 7.6* 7.7*   PT/INR  Basename 07/16/11 0530 07/15/11 0500  LABPROT 18.7* 17.6*  INR 1.53* 1.42   Comprehensive Metabolic Panel:    Component Value Date/Time   NA 134* 07/15/2011 0500   K 3.0* 07/15/2011 0500   CL 105 07/15/2011 0500   CO2 22 07/15/2011 0500   BUN 14 07/15/2011 0500   CREATININE 1.05 07/15/2011 0500   GLUCOSE 226* 07/15/2011 0500   CALCIUM 7.6* 07/15/2011 0500   AST 13 07/13/2011 0425   ALT 12 07/13/2011 0425   ALKPHOS 68 07/13/2011 0425   BILITOT 0.3 07/13/2011 0425   PROT 4.6* 07/13/2011 0425   ALBUMIN 1.2* 07/13/2011 0425     Studies/Results: No results found.  Anti-infectives: Anti-infectives     Start     Dose/Rate Route Frequency Ordered Stop   07/15/11 0900   vancomycin (VANCOCIN) IVPB 1000 mg/200 mL premix        1,000 mg 200 mL/hr over 60 Minutes Intravenous Every 12 hours 07/15/11 0407     07/12/11 1800   metroNIDAZOLE (FLAGYL) IVPB 500 mg        500 mg 100 mL/hr over 60 Minutes Intravenous 4  times per day 07/12/11 1645     07/12/11 1530   vancomycin (VANCOCIN) 500 mg in sodium chloride irrigation 0.9 % 100 mL enema        500 mg 66.7 mL/hr over 90 Minutes Rectal 4 times per day 07/12/11 1450 07/13/11 0108   07/11/11 2359   vancomycin (VANCOCIN) IVPB 1000 mg/200 mL premix  Status:  Discontinued        1,000 mg 200 mL/hr over 60 Minutes Intravenous Every 24 hours 07/11/11 0807 07/15/11 0407   07/10/11 1800   vancomycin (VANCOCIN) 500 mg in sodium chloride 0.9 % 100 mL IVPB  Status:  Discontinued        500 mg 100 mL/hr over 60 Minutes Intravenous Every 12 hours 07/10/11 1641 07/11/11 0806   07/10/11 1800   piperacillin-tazobactam (ZOSYN) IVPB 3.375 g  Status:  Discontinued        3.375 g 12.5 mL/hr over 240 Minutes Intravenous Every 8 hours 07/10/11 1708 07/15/11 0720   07/10/11 0800   ertapenem (INVANZ) 1 g in sodium chloride 0.9 % 50 mL IVPB  Status:  Discontinued        1 g 100 mL/hr over 30 Minutes Intravenous Every 24 hours 07/10/11 0739 07/10/11 1659   06/26/11 2000  ceFAZolin (ANCEF) IVPB 1 g/50 mL premix        1 g 100 mL/hr over 30 Minutes Intravenous Every 6 hours 06/26/11 1658 06/27/11 0857          Assessment Active Problems:  PE (pulmonary embolism)  Chronic anticoagulation   SBO s/p expl lap, LOA, repair of enterotomy-ileus persists and is likely secondary to the postop state, the colitis and the hypokalemia  Hypokalemia-slowly improving  Anema-improved  C. Diff colitis-less diarrhea  CVA   LOS: 22 days   Plan: Continue parenteral nutrition.  Replete potassium.  Leave ng in.   Roby Donaway J 07/16/2011

## 2011-07-16 NOTE — Progress Notes (Signed)
Neuro: Follow commands, alert/oriented.  Active ROM on bilateral upper and lower extremities. L side strength 3/5, R 5/5 strength noted. CV:ST on monitor, routine metoprolol iv for HR.  IV: R brachiocephalic Picc line  IVF: TPN and lipids, heparin drip GI: NPO, NGT to LIS, flexiseal containing liquid stool, remains C-Diff +. Heating pad to abdomen due to ileus. GU: Foley output good Dressings: R hip frx dressing intact. Ice pack applied, fentanyl 25 mcg iv given for pain. Updates to family at 3pm on 07/15/11 by Dr. Nehemiah Settle, son requested family conference about ileus, swallow eval, overall status. Son and grandchildren visited this evening.

## 2011-07-17 ENCOUNTER — Inpatient Hospital Stay (HOSPITAL_COMMUNITY): Payer: Medicare HMO

## 2011-07-17 LAB — BASIC METABOLIC PANEL
Calcium: 7.6 mg/dL — ABNORMAL LOW (ref 8.4–10.5)
Chloride: 104 mEq/L (ref 96–112)
Creatinine, Ser: 0.92 mg/dL (ref 0.50–1.35)
GFR calc Af Amer: >90 mL/min (ref >90)

## 2011-07-17 LAB — GLUCOSE, CAPILLARY
Glucose-Capillary: 196 mg/dL — ABNORMAL HIGH (ref 70–99)
Glucose-Capillary: 225 mg/dL — ABNORMAL HIGH (ref 70–99)
Glucose-Capillary: 192 mg/dL — ABNORMAL HIGH (ref 70–99)
Glucose-Capillary: 184 mg/dL — ABNORMAL HIGH (ref 70–99)

## 2011-07-17 LAB — PROTIME-INR: INR: 1.5 — ABNORMAL HIGH (ref 0.00–1.49)

## 2011-07-17 LAB — MAGNESIUM: Magnesium: 1.6 mg/dL (ref 1.5–2.5)

## 2011-07-17 MED ORDER — TRACE MINERALS CR-CU-MN-SE-ZN 10-1000-500-60 MCG/ML IV SOLN
INTRAVENOUS | Status: AC
  Administered 2011-07-17: 18:00:00 via INTRAVENOUS
  Filled 2011-07-17: qty 2000

## 2011-07-17 MED ORDER — HEPARIN (PORCINE) IN NACL 100-0.45 UNIT/ML-% IJ SOLN
1350.0000 [IU]/h | INTRAMUSCULAR | Status: DC
  Administered 2011-07-17: 1350 [IU]/h via INTRAVENOUS
  Filled 2011-07-17 (×4): qty 250

## 2011-07-17 MED ORDER — PHENOL 1.4 % MT LIQD
1.0000 | OROMUCOSAL | Status: DC | PRN
  Filled 2011-07-17: qty 177

## 2011-07-17 MED ORDER — FAT EMULSION 20 % IV EMUL
250.0000 mL | INTRAVENOUS | Status: AC
  Administered 2011-07-17: 250 mL via INTRAVENOUS
  Filled 2011-07-17: qty 250

## 2011-07-17 NOTE — Progress Notes (Signed)
PARENTERAL NUTRITION CONSULT NOTE - FOLLOW UP  Pharmacy Consult for TNA Indication: SBO  No Known Allergies  Patient Measurements: Height: 5\' 6"  (167.6 cm) Weight: 162 lb 4.1 oz (73.6 kg) IBW/kg (Calculated) : 63.8  Usual Weight: unable to assess  Vital Signs: Temp: 97.6 F (36.4 C) (03/15 0800) Temp src: Oral (03/15 0800) BP: 167/84 mmHg (03/15 0645) Pulse Rate: 104  (03/15 0645)  Intake/Output from previous day: 03/14 0701 - 03/15 0700 In: 2908 [I.V.:308; IV Piggyback:850; TPN:1750] Out: 3850 [Urine:3000; Emesis/NG output:300; Stool:550]  Labs:  Basename 07/16/11 0730 07/16/11 0530 07/15/11 0500  WBC -- -- 8.2  HGB -- -- 9.9*  HCT -- -- 29.1*  PLT -- -- 242  APTT -- -- --  INR 1.50* 1.53* 1.42     Basename 07/17/11 0500 07/16/11 0730 07/15/11 0500  NA 132* 133* 134*  K 3.6 3.0* 3.0*  CL 104 104 105  CO2 23 23 22   GLUCOSE 220* 206* 226*  BUN 16 15 14   CREATININE 0.92 0.97 1.05  LABCREA -- -- --  CREAT24HRUR -- -- --  CALCIUM 7.6* 7.5* 7.6*  MG 1.6 1.5 1.7  PHOS 2.3 2.6 2.6  PROT -- 5.1* --  ALBUMIN -- 1.4* --  AST -- 13 --  ALT -- 8 --  ALKPHOS -- 66 --  BILITOT -- 0.2* --  BILIDIR -- -- --  IBILI -- -- --  PREALBUMIN -- -- --  TRIG -- -- --  CHOLHDL -- -- --  CHOL -- -- --   Estimated Creatinine Clearance: 62.6 ml/min (by C-G formula based on Cr of 0.92).   Medications:  Scheduled:     . antiseptic oral rinse  15 mL Mouth Rinse q12n4p  . chlorhexidine  15 mL Mouth Rinse BID  . insulin aspart  0-15 Units Subcutaneous Q4H  . lip balm  1 application Topical BID  . metoprolol  5 mg Intravenous Q6H  . metronidazole  500 mg Intravenous Q6H  . pantoprazole (PROTONIX) IV  40 mg Intravenous Q24H  . potassium chloride  10 mEq Intravenous Q1 Hr x 4  . potassium chloride  10 mEq Intravenous Q1 Hr x 5  . sodium chloride  10-40 mL Intracatheter Q12H  . DISCONTD: vancomycin  1,000 mg Intravenous Q12H   Infusions:  NS with 30K at 107ml/hr  Heparin  13 ml/hr  Nutritional Goals:   1850-2110 kCal, 79-92 grams of protein per day  Clinimix E5/20 72ml/hr + 20% Lipids 75ml/hr will provide 90gm protein, 1584 kcal on non-lipid days, 2064 kcal on lipid days, average 1790 kcal/week  Current Nutrition:  Clinimix E5/20 at 75 ml/hr +/- IV lipids at 83ml/hr on MWF. NPO  Assessment:  77 YOM s/p ORIF 2/22, GI bleed 2/28 thought to be 2/2 diverticular disease, CT 3/4 consistent with SBO. NG tube with significant output. Last meal recorded 2/28.  POD#7 LOA, serosal/enterotomy repair, fecal disimpaction.  Tolerating TNA at goal rate of 75 ml/hr  Lytes: K now wnl after total IV KCl on 3/14, plus K+ in IVF (also has insulin in TNA). Na low (unable to change TNA content). Correct calcium wnl (9.7). Phos and Mg wnl but on low-side of normal.  Prealbumin: baseline = 5.4, on 3/11 it was < 3; Lipids have remained normal  CBGs: Range 196-273 last 24 hours. After insulin content in TNA increased last night, CBG range 196-230. On SSI q4h plus getting another 32 units insulin over 24 hours from TNA.  IVF: TNA @ 77ml/hr +  IV lipids @ 41ml/hr on MWF+ NS30K @ 60ml/hr (total fluid = 135 ml/hr on MWF, 125 ml/hr on TuThSS)  GI Prophylaxis: IV Protonix, Reglan IV q8h prn  Plan:   Continue Clinimix E5/20 at goal rate of 75 ml/hr with 36 units insulin per 2L bag Fat emulsion at 10 ml/hr, Standard multivitamins and trace elements on MWF only due to ongoing shortage. IVF per MD Renal function panel in am - watch K, Mg, Phos No change in insulin content of TNA - watch CBGs for full 24 hours after 60% increase in insulin content of TNA last night. TNA lab panels on Mondays & Thursdays.  Darrol Angel, PharmD Pager: [phone] 07/17/2011 8:14 AM

## 2011-07-17 NOTE — Progress Notes (Signed)
ANTICOAGULATION CONSULT NOTE - Follow Up Consult  Pharmacy Consult for Heparin Indication: VTE, Afib, and r/o CVA  No Known Allergies  Patient Measurements: Height: 5\' 6"  (167.6 cm) Weight: 157 lb 13.6 oz (71.6 kg) IBW/kg (Calculated) : 63.8   Vital Signs: Temp: 98.4 F (36.9 C) (03/15 0000) Temp src: Oral (03/15 0000) BP: 154/89 mmHg (03/15 0100) Pulse Rate: 121  (03/15 0200)  Labs:  Basename 07/17/11 0205 07/16/11 1600 07/16/11 0730 07/16/11 0530 07/15/11 0500 07/14/11 1449 07/14/11 0432  HGB -- -- -- -- 9.9* -- 9.2*  HCT -- -- -- -- 29.1* -- 27.4*  PLT -- -- -- -- 242 -- 211  APTT -- -- -- -- -- -- --  LABPROT -- -- 18.4* 18.7* 17.6* -- --  INR -- -- 1.50* 1.53* 1.42 -- --  HEPARINUNFRC 0.51 0.58 0.74* -- -- -- --  CREATININE -- -- 0.97 -- 1.05 1.09 --  CKTOTAL -- -- -- -- -- -- --  CKMB -- -- -- -- -- -- --  TROPONINI -- -- -- -- -- -- --   Estimated Creatinine Clearance: 59.4 ml/min (by C-G formula based on Cr of 0.97).   Medications:  Scheduled:     . antiseptic oral rinse  15 mL Mouth Rinse q12n4p  . chlorhexidine  15 mL Mouth Rinse BID  . insulin aspart  0-15 Units Subcutaneous Q4H  . lip balm  1 application Topical BID  . metoprolol  5 mg Intravenous Q6H  . metronidazole  500 mg Intravenous Q6H  . pantoprazole (PROTONIX) IV  40 mg Intravenous Q24H  . potassium chloride  10 mEq Intravenous Q1 Hr x 4  . potassium chloride  10 mEq Intravenous Q1 Hr x 5  . sodium chloride  10-40 mL Intracatheter Q12H  . DISCONTD: vancomycin  1,000 mg Intravenous Q12H    Assessment:  78 yo M, admit on chronic warfarin for history of VTE, s/p ORIF on 2/22, ex-lap with LOA, enterotomy 3/8, currently on heparin drip for PE, Afib, and CVA   Heparin infusing at 1350 units/hr, no issues noted  Heparin level = 0.51 (slightly above goal)  Goal of Therapy:  Heparin level 0.3-0.5   Plan:   Decrease heparin infusion to 1300 units/hr (13 ml/hr)  Recheck level in  8hrs  Per neurology note, should wait 5-10 days after CVA before starting Coumadin (if we are going to use) due to risk of conversion of sizeable infarct.   Terrilee Files, PharmD 07/17/2011 2:46 AM

## 2011-07-17 NOTE — Progress Notes (Signed)
ANTICOAGULATION CONSULT NOTE - Follow Up Consult  Pharmacy Consult for Heparin Indication: VTE, Afib, and r/o CVA  No Known Allergies  Patient Measurements: Height: 5\' 6"  (167.6 cm) Weight: 162 lb 4.1 oz (73.6 kg) IBW/kg (Calculated) : 63.8   Vital Signs: Temp: 98.9 F (37.2 C) (03/15 1600) Temp src: Oral (03/15 1600) BP: 140/91 mmHg (03/15 1600) Pulse Rate: 126  (03/15 1600)  Labs:  Basename 07/17/11 1745 07/17/11 0925 07/17/11 0500 07/17/11 0205 07/16/11 0730 07/16/11 0530 07/15/11 0500  HGB -- -- -- -- -- -- 9.9*  HCT -- -- -- -- -- -- 29.1*  PLT -- -- -- -- -- -- 242  APTT -- -- -- -- -- -- --  LABPROT -- 18.4* -- -- 18.4* 18.7* --  INR -- 1.50* -- -- 1.50* 1.53* --  HEPARINUNFRC 0.26* 0.34 -- 0.51 -- -- --  CREATININE -- -- 0.92 -- 0.97 -- 1.05  CKTOTAL -- -- -- -- -- -- --  CKMB -- -- -- -- -- -- --  TROPONINI -- -- -- -- -- -- --   Estimated Creatinine Clearance: 62.6 ml/min (by C-G formula based on Cr of 0.92).   Medications:  Scheduled:     . antiseptic oral rinse  15 mL Mouth Rinse q12n4p  . chlorhexidine  15 mL Mouth Rinse BID  . insulin aspart  0-15 Units Subcutaneous Q4H  . lip balm  1 application Topical BID  . metoprolol  5 mg Intravenous Q6H  . metronidazole  500 mg Intravenous Q6H  . pantoprazole (PROTONIX) IV  40 mg Intravenous Q24H  . potassium chloride  10 mEq Intravenous Q1 Hr x 4  . sodium chloride  10-40 mL Intracatheter Q12H    Assessment:  78 yo M, admit on chronic warfarin for history of VTE, s/p ORIF on 2/22, ex-lap with LOA, enterotomy 3/8, currently on heparin drip for PE, Afib, and CVA   Heparin infusing at 1300 units/hr, no interruptions or complications per RN  Heparin level has continued to decrease, now 0.26, slightly below goal range  Goal of Therapy:  Heparin level 0.3-0.5   Plan:   Increase heparin infusion to 1350 units/hr (13.5 ml/hr)  Recheck level in 8 hrs to confirm dose.  Per neurology note, should wait  5-10 days after CVA before starting Coumadin (if we are going to use) due to risk of conversion of sizeable infarct.    Lynann Beaver PharmD, BCPS Pager [phone] 07/17/2011 7:26 PM

## 2011-07-17 NOTE — Progress Notes (Signed)
CSW continues to follow this pt to assist with d/c planning needs. [facility] remains interested in providing SNF placement for this pt once stable. [employer] insurance will need to reauthorize prior approval for SNF. CSW will initiate this process once approximate d/c date is available.   Cori Razor LCSW [phone]

## 2011-07-17 NOTE — Progress Notes (Signed)
Nutrition Follow-up  Diet Order:  NPO  Pt continues TPN, Clinimix 5/20 @ 75 mL/hr with lipids MWF @ 10 mL/hr providing 1789 kcal, 90g protein meeting 96% kcal, needs, 100% estimated protein needs.  Pt continues with ileus and NGT.  Decreased NG tube output.  Totals 50-300 mL/day.  Increased stool output: 500-600 mL/day, c.diff colitis.  Per RN, overall decreased.  Meds: Scheduled Meds:   . antiseptic oral rinse  15 mL Mouth Rinse q12n4p  . chlorhexidine  15 mL Mouth Rinse BID  . insulin aspart  0-15 Units Subcutaneous Q4H  . lip balm  1 application Topical BID  . metoprolol  5 mg Intravenous Q6H  . metronidazole  500 mg Intravenous Q6H  . pantoprazole (PROTONIX) IV  40 mg Intravenous Q24H  . potassium chloride  10 mEq Intravenous Q1 Hr x 4  . potassium chloride  10 mEq Intravenous Q1 Hr x 5  . sodium chloride  10-40 mL Intracatheter Q12H  . DISCONTD: vancomycin  1,000 mg Intravenous Q12H   Continuous Infusions:   . fat emulsion 250 mL (07/15/11 1723)  . fat emulsion    . heparin 1,300 Units/hr (07/17/11 0130)  . 0.9 % sodium chloride with kcl 50 mL/hr at 07/17/11 0900  . TPN (CLINIMIX) +/- additives 75 mL/hr at 07/15/11 1722  . TPN (CLINIMIX) +/- additives 75 mL/hr at 07/16/11 1720  . TPN (CLINIMIX) +/- additives    . DISCONTD: heparin 1,450 Units/hr (07/16/11 0821)   PRN Meds:.acetaminophen (TYLENOL) oral liquid 160 mg/5 mL, fentaNYL, metoCLOPramide (REGLAN) injection, phenol, sodium chloride  Labs:  CMP     Component Value Date/Time   NA 132* 07/17/2011 0500   K 3.6 07/17/2011 0500   CL 104 07/17/2011 0500   CO2 23 07/17/2011 0500   GLUCOSE 220* 07/17/2011 0500   BUN 16 07/17/2011 0500   CREATININE 0.92 07/17/2011 0500   CALCIUM 7.6* 07/17/2011 0500   PROT 5.1* 07/16/2011 0730   ALBUMIN 1.4* 07/16/2011 0730   AST 13 07/16/2011 0730   ALT 8 07/16/2011 0730   ALKPHOS 66 07/16/2011 0730   BILITOT 0.2* 07/16/2011 0730   GFRNONAA 80* 07/17/2011 0500   GFRAA >90 07/17/2011 0500    CBG (last 3)   Basename 07/17/11 0739 07/17/11 0352 07/16/11 2325  GLUCAP 225* 219* 196*   PharmD monitoring CBGs today to assess for changes needed.    Lipid Panel     Component Value Date/Time   CHOL 54 07/14/2011 0433   TRIG 61 07/14/2011 0433   HDL 14* 07/14/2011 0433   CHOLHDL 3.9 07/14/2011 0433   VLDL 12 07/14/2011 0433   LDLCALC 28 07/14/2011 0433    Intake/Output Summary (Last 24 hours) at 07/17/11 1027 Last data filed at 07/17/11 0900  Gross per 24 hour  Intake 2671.5 ml  Output   3800 ml  Net -1128.5 ml    Weight Status:  Significantly increased Admission wt: 134 lbs Current wt: 162 lbs  Nutrition Dx:  Inadequate oral intake, ongoing  Intervention:   1.  Parenteral nutrition; continued management per PharmD. 2.  Enteral nutrition; pt with NGT, continued ileus.  If trial of trickle feeds warranted, recommend initiating Promote @ 10 mL/hr continuous via NGT. 3.  Blood glucose; pt is on SSI.  Discussed with PharmD.  Pt getting 32 units/24hrs via TPN, plus an additional ~28 units via SSI coverage.  PharmD to manage.  Monitor:   1.  Parenteral nutrition; continued management per PharmD, continued tolerance with pt meeting  90-100% estimated needs.  Met, continue. 2.  Food/Beverage; tolerance if PO diet resume when ileus resolved.  Not met, continue 3.  Enteral nutrition;  Trickle feeds if appropriate per MD with tolerance and stimulation of gut function. 4.  Blood glucose; goal for wound healing <180 mg/dL.  Hoyt Koch Pager #:  [phone]

## 2011-07-17 NOTE — Progress Notes (Signed)
Physical Therapy Treatment Patient Details Name: [name] MRN: [id number] DOB: 1934-04-15 Today's Date: 07/17/2011  PT Assessment/Plan  PT - Assessment/Plan Comments on Treatment Session: pt progressing slowly, participates well although globally deconditioned at this point; will benefit from CIR vs STSNFHR ; HR 129-119; Sats 98% on RA;BP 175/96--normal for pt  and acceptable at this point PT Plan: Discharge plan remains appropriate;Frequency remains appropriate PT Frequency: Min 3X/week Recommendations for Other Services: Rehab consult Follow Up Recommendations: Skilled nursing facility;Inpatient Rehab;LTACH Equipment Recommended: Defer to next venue PT Goals  Acute Rehab PT Goals Pt will Roll Supine to Right Side: with mod assist;with rail Pt will Roll Supine to Left Side: with mod assist;with rail PT Goal: Rolling Supine to Left Side - Progress: Progressing toward goal Pt will Sit at Edge of Bed: 6-10 min;with min assist;with unilateral upper extremity support;with cues (comment type and amount) PT Goal: Sit at Edge Of Bed - Progress: Progressing toward goal PT Goal: Ambulate - Progress: Discontinued (comment)  PT Treatment Precautions/Restrictions  Precautions Precautions: Fall Precaution Comments: S/P ORIF right hip--no precautions Required Braces or Orthoses: No Restrictions Weight Bearing Restrictions: No RLE Weight Bearing: Weight bearing as tolerated Other Position/Activity Restrictions: Pt aware he is WBAT Mobility (including Balance) Bed Mobility Bed Mobility: Yes Rolling Left: 1: +2 Total assist Rolling Left Details (indicate cue type and reason): Pt=15% Utilized bedpad for assistance. Multi-modal cues for encouragement, initiation of task, technique, head positioning. Left Sidelying to Sit: 1: +2 Total assist;HOB flat Left Sidelying to Sit Details (indicate cue type and reason): pt15%; +2 for rolling, UB/LB assist, safety and lines Sitting - Scoot to Edge of  Bed: 1: +2 Total assist Sitting - Scoot to Edge of Bed Details (indicate cue type and reason): pt=10%; +2 for wt shift for reciprocal scooting Transfers Transfer via Lift Equipment: [place]  Posture/Postural Control Posture/Postural Control: Postural limitations Postural Limitations: pt positions head in flexion and rotation to right; encouraged pt to look out window durign day and RN will also encourage left cervical rotation and flexion Static Sitting Balance Static Sitting - Balance Support: Right upper extremity supported;Feet supported Static Sitting - Level of Assistance: 1: +2 Total assist Static Sitting - Comment/# of Minutes: sat EOB x 15%-25%; +2 for balance safety and lines; wt shifting with skypad placed in sitting pt with LOB posteriorly and to right, able to  initiatecorrection with assist and verbal cues  Exercise  Total Joint Exercises Ankle Circles/Pumps: AROM;10 reps;PROM;Both;Supine Heel Slides: AAROM;Both;10 reps;Supine Hip ABduction/ADduction: AROM;AAROM;Right;Left;10 reps General Exercises - Upper Extremity Shoulder Flexion: PROM;AROM;Both;10 reps;Supine Elbow Flexion: AAROM;PROM;Left;10 reps;Supine Elbow Extension: AAROM;PROM;Left;10 reps;Supine Other Exercises Other Exercises: cervical rotation and flexion to left Other Exercises: hand flex/ext fingers- AROM R UE, PROM L UE, 10 reps supine End of Session PT - End of Session Activity Tolerance: Patient tolerated treatment well Patient left: in chair;with call bell in reach Nurse Communication: Need for lift equipment General Behavior During Session: [facility] for tasks performed Cognition: [facility] for tasks performed  [facility] 07/17/2011, 10:43 AM

## 2011-07-17 NOTE — Progress Notes (Signed)
ANTICOAGULATION CONSULT NOTE - Follow Up Consult  Pharmacy Consult for Heparin Indication: VTE, Afib, and r/o CVA  No Known Allergies  Patient Measurements: Height: 5\' 6"  (167.6 cm) Weight: 162 lb 4.1 oz (73.6 kg) IBW/kg (Calculated) : 63.8   Vital Signs: Temp: 97.6 F (36.4 C) (03/15 0800) Temp src: Oral (03/15 0800) BP: 175/96 mmHg (03/15 1000) Pulse Rate: 126  (03/15 1000)  Labs:  Basename 07/17/11 0925 07/17/11 0500 07/17/11 0205 07/16/11 1600 07/16/11 0730 07/16/11 0530 07/15/11 0500  HGB -- -- -- -- -- -- 9.9*  HCT -- -- -- -- -- -- 29.1*  PLT -- -- -- -- -- -- 242  APTT -- -- -- -- -- -- --  LABPROT 18.4* -- -- -- 18.4* 18.7* --  INR 1.50* -- -- -- 1.50* 1.53* --  HEPARINUNFRC 0.34 -- 0.51 0.58 -- -- --  CREATININE -- 0.92 -- -- 0.97 -- 1.05  CKTOTAL -- -- -- -- -- -- --  CKMB -- -- -- -- -- -- --  TROPONINI -- -- -- -- -- -- --   Estimated Creatinine Clearance: 62.6 ml/min (by C-G formula based on Cr of 0.92).   Medications:  Scheduled:     . antiseptic oral rinse  15 mL Mouth Rinse q12n4p  . chlorhexidine  15 mL Mouth Rinse BID  . insulin aspart  0-15 Units Subcutaneous Q4H  . lip balm  1 application Topical BID  . metoprolol  5 mg Intravenous Q6H  . metronidazole  500 mg Intravenous Q6H  . pantoprazole (PROTONIX) IV  40 mg Intravenous Q24H  . potassium chloride  10 mEq Intravenous Q1 Hr x 4  . potassium chloride  10 mEq Intravenous Q1 Hr x 5  . sodium chloride  10-40 mL Intracatheter Q12H  . DISCONTD: vancomycin  1,000 mg Intravenous Q12H    Assessment:  78 yo M, admit on chronic warfarin for history of VTE, s/p ORIF on 2/22, ex-lap with LOA, enterotomy 3/8, currently on heparin drip for PE, Afib, and CVA   Heparin infusing at 1300 units/hr, no issues noted  Heparin level = 0.34 (within goal range)  Goal of Therapy:  Heparin level 0.3-0.5   Plan:   Continue heparin infusion to 1300 units/hr (13 ml/hr)  Recheck level in 8 hrs to confirm  dose.  Per neurology note, should wait 5-10 days after CVA before starting Coumadin (if we are going to use) due to risk of conversion of sizeable infarct.  Darrol Angel, PharmD Pager: [phone] 07/17/2011 11:04 AM

## 2011-07-17 NOTE — Progress Notes (Signed)
7 Days Post-Op  Subjective: Incisional soreness  Objective: Vital signs in last 24 hours: Temp:  [97.4 F (36.3 C)-98.9 F (37.2 C)] 97.6 F (36.4 C) (03/15 0800) Pulse Rate:  [104-132] 110  (03/15 0700) Resp:  [19-29] 24  (03/15 0700) BP: (143-183)/(78-113) 164/81 mmHg (03/15 0700) SpO2:  [97 %-99 %] 99 % (03/15 0700) Weight:  [162 lb 4.1 oz (73.6 kg)] 162 lb 4.1 oz (73.6 kg) (03/15 0600) Last BM Date: 07/16/11  Intake/Output from previous day: 03/14 0701 - 03/15 0700 In: 2908 [I.V.:308; IV Piggyback:850; TPN:1750] Out: 3850 [Urine:3000; Emesis/NG output:300; Stool:550] Intake/Output this shift:    PE: Abd-slightly firm and distended, rare BS, incisions clean   Lab Results:   Basename 07/15/11 0500  WBC 8.2  HGB 9.9*  HCT 29.1*  PLT 242   BMET  Basename 07/17/11 0500 07/16/11 0730  NA 132* 133*  K 3.6 3.0*  CL 104 104  CO2 23 23  GLUCOSE 220* 206*  BUN 16 15  CREATININE 0.92 0.97  CALCIUM 7.6* 7.5*   PT/INR  Basename 07/16/11 0730 07/16/11 0530  LABPROT 18.4* 18.7*  INR 1.50* 1.53*   Comprehensive Metabolic Panel:    Component Value Date/Time   NA 132* 07/17/2011 0500   K 3.6 07/17/2011 0500   CL 104 07/17/2011 0500   CO2 23 07/17/2011 0500   BUN 16 07/17/2011 0500   CREATININE 0.92 07/17/2011 0500   GLUCOSE 220* 07/17/2011 0500   CALCIUM 7.6* 07/17/2011 0500   AST 13 07/16/2011 0730   ALT 8 07/16/2011 0730   ALKPHOS 66 07/16/2011 0730   BILITOT 0.2* 07/16/2011 0730   PROT 5.1* 07/16/2011 0730   ALBUMIN 1.4* 07/16/2011 0730     Studies/Results: No results found.  Anti-infectives: Anti-infectives     Start     Dose/Rate Route Frequency Ordered Stop   07/15/11 0900   vancomycin (VANCOCIN) IVPB 1000 mg/200 mL premix  Status:  Discontinued        1,000 mg 200 mL/hr over 60 Minutes Intravenous Every 12 hours 07/15/11 0407 07/16/11 1325   07/12/11 1800   metroNIDAZOLE (FLAGYL) IVPB 500 mg        500 mg 100 mL/hr over 60 Minutes Intravenous 4 times  per day 07/12/11 1645     07/12/11 1530   vancomycin (VANCOCIN) 500 mg in sodium chloride irrigation 0.9 % 100 mL enema        500 mg 66.7 mL/hr over 90 Minutes Rectal 4 times per day 07/12/11 1450 07/13/11 0108   07/11/11 2359   vancomycin (VANCOCIN) IVPB 1000 mg/200 mL premix  Status:  Discontinued        1,000 mg 200 mL/hr over 60 Minutes Intravenous Every 24 hours 07/11/11 0807 07/15/11 0407   07/10/11 1800   vancomycin (VANCOCIN) 500 mg in sodium chloride 0.9 % 100 mL IVPB  Status:  Discontinued        500 mg 100 mL/hr over 60 Minutes Intravenous Every 12 hours 07/10/11 1641 07/11/11 0806   07/10/11 1800   piperacillin-tazobactam (ZOSYN) IVPB 3.375 g  Status:  Discontinued        3.375 g 12.5 mL/hr over 240 Minutes Intravenous Every 8 hours 07/10/11 1708 07/15/11 0720   07/10/11 0800   ertapenem (INVANZ) 1 g in sodium chloride 0.9 % 50 mL IVPB  Status:  Discontinued        1 g 100 mL/hr over 30 Minutes Intravenous Every 24 hours 07/10/11 0739 07/10/11 1659   06/26/11 2000  ceFAZolin (ANCEF) IVPB 1 g/50 mL premix        1 g 100 mL/hr over 30 Minutes Intravenous Every 6 hours 06/26/11 1658 06/27/11 0857          Assessment Active Problems:  PE (pulmonary embolism)  Chronic anticoagulation   SBO s/p expl lap, LOA, repair of enterotomy-ileus persists and is likely secondary to the postop state, the colitis and the hypokalemia(which has been corrected)  Hypokalemia-corrected  Anema-improved  C. Diff colitis-less diarrhea  CVA   LOS: 23 days   Plan: Continue parenteral nutrition.   Leave ng in until ileus resolves.  Check abd x-ray.   [name] J 07/17/2011

## 2011-07-17 NOTE — Progress Notes (Signed)
Subjective: Patient had a good night, abdominal pain control. According to nursing stool decreased. NG tube output decreased. Patient now has A little more upper extremity movement . His potassium is now in the normal range.  Objective: Vital signs in last 24 hours: Temp:  [97.4 F (36.3 C)-98.9 F (37.2 C)] 97.6 F (36.4 C) (03/15 0800) Pulse Rate:  [104-132] 104  (03/15 0645) Resp:  [19-29] 22  (03/15 0645) BP: (143-183)/(78-113) 167/84 mmHg (03/15 0645) SpO2:  [97 %-99 %] 98 % (03/15 0645) Weight:  [73.6 kg (162 lb 4.1 oz)] 73.6 kg (162 lb 4.1 oz) (03/15 0600) Weight change:  Last BM Date: 07/16/11  Intake/Output from previous day: 03/14 0701 - 03/15 0700 In: 2908 [I.V.:308; IV Piggyback:850; TPN:1750] Out: 3850 [Urine:3000; Emesis/NG output:300; Stool:550] Intake/Output this shift:    General appearance: alert and cooperative Resp: clear to auscultation bilaterally Cardio: tachycardic but regular GI: appears less distended, bowel sounds appreciated today, still discomfort with palpation mainly along the incision area Extremities: PAS hose in place Neuro left hemiparesis, improved movement of upper extremity today Lab Results:  Results for orders placed during the hospital encounter of 06/24/11 (from the past 24 hour(s))  GLUCOSE, CAPILLARY     Status: Abnormal   Collection Time   07/16/11 11:32 AM      Component Value Range   Glucose-Capillary 230 (*) 70 - 99 (mg/dL)   Comment 1 Documented in Chart     Comment 2 Notify RN    GLUCOSE, CAPILLARY     Status: Abnormal   Collection Time   07/16/11  3:38 PM      Component Value Range   Glucose-Capillary 273 (*) 70 - 99 (mg/dL)  HEPARIN LEVEL (UNFRACTIONATED)     Status: Normal   Collection Time   07/16/11  4:00 PM      Component Value Range   Heparin Unfractionated 0.58  0.30 - 0.70 (IU/mL)  GLUCOSE, CAPILLARY     Status: Abnormal   Collection Time   07/16/11  7:28 PM      Component Value Range   Glucose-Capillary 230  (*) 70 - 99 (mg/dL)   Comment 1 Notify RN    GLUCOSE, CAPILLARY     Status: Abnormal   Collection Time   07/16/11 11:25 PM      Component Value Range   Glucose-Capillary 196 (*) 70 - 99 (mg/dL)   Comment 1 Notify RN    HEPARIN LEVEL (UNFRACTIONATED)     Status: Normal   Collection Time   07/17/11  2:05 AM      Component Value Range   Heparin Unfractionated 0.51  0.30 - 0.70 (IU/mL)  GLUCOSE, CAPILLARY     Status: Abnormal   Collection Time   07/17/11  3:52 AM      Component Value Range   Glucose-Capillary 219 (*) 70 - 99 (mg/dL)  BASIC METABOLIC PANEL     Status: Abnormal   Collection Time   07/17/11  5:00 AM      Component Value Range   Sodium 132 (*) 135 - 145 (mEq/L)   Potassium 3.6  3.5 - 5.1 (mEq/L)   Chloride 104  96 - 112 (mEq/L)   CO2 23  19 - 32 (mEq/L)   Glucose, Bld 220 (*) 70 - 99 (mg/dL)   BUN 16  6 - 23 (mg/dL)   Creatinine, Ser 9.56  0.50 - 1.35 (mg/dL)   Calcium 7.6 (*) 8.4 - 10.5 (mg/dL)   GFR calc non Af [name]  80 (*) >90 (mL/min)   GFR calc Af Amer >90  >90 (mL/min)  MAGNESIUM     Status: Normal   Collection Time   07/17/11  5:00 AM      Component Value Range   Magnesium 1.6  1.5 - 2.5 (mg/dL)  PHOSPHORUS     Status: Normal   Collection Time   07/17/11  5:00 AM      Component Value Range   Phosphorus 2.3  2.3 - 4.6 (mg/dL)  GLUCOSE, CAPILLARY     Status: Abnormal   Collection Time   07/17/11  7:39 AM      Component Value Range   Glucose-Capillary 225 (*) 70 - 99 (mg/dL)      Studies/Results: No results found.  Medications:  Scheduled:   . antiseptic oral rinse  15 mL Mouth Rinse q12n4p  . chlorhexidine  15 mL Mouth Rinse BID  . insulin aspart  0-15 Units Subcutaneous Q4H  . lip balm  1 application Topical BID  . metoprolol  5 mg Intravenous Q6H  . metronidazole  500 mg Intravenous Q6H  . pantoprazole (PROTONIX) IV  40 mg Intravenous Q24H  . potassium chloride  10 mEq Intravenous Q1 Hr x 4  . potassium chloride  10 mEq Intravenous Q1 Hr x 5    . sodium chloride  10-40 mL Intracatheter Q12H  . DISCONTD: vancomycin  1,000 mg Intravenous Q12H   Continuous:   . fat emulsion 250 mL (07/15/11 1723)  . heparin 1,300 Units/hr (07/17/11 0130)  . 0.9 % sodium chloride with kcl 50 mL/hr at 07/17/11 0600  . TPN (CLINIMIX) +/- additives 75 mL/hr at 07/15/11 1722  . TPN (CLINIMIX) +/- additives 75 mL/hr at 07/16/11 1720  . DISCONTD: heparin 1,450 Units/hr (07/16/11 1610)    Assessment/Plan: CVA with left hemiparesis, continue heparin continue recommendations per neurology. CT angiogram the neck shows occluded right ICA,  Status post lysis of adhesions, patient still of ileus  Hypokalemia, Now resolved,We will continue some potassium in IV fluids and potassium runs As needed Acute on chronic kidney disease, Resolved  History of PE. Recent ultrasound shows chronic bilateral DVT  Status post GI bleed which required reversal of anticoagulation, EGD without definitive source, previous colonoscopy only revealed diverticulosis.  Anemia multifactorial , Hemoglobin stable no recurrent bleeding  Clostridium difficile, on Flagyl  Ileus management per surgery  Elevated CBGs, insulin has been added to TPN  Mildly elevated BNP, Echo pending, currently patient is oxygenating well.  Malnutrition continue TPN  Decondition continue physical therapy    LOS: 23 days   Jaylene Arrowood D 07/17/2011, 8:20 AM

## 2011-07-18 LAB — GLUCOSE, CAPILLARY
Glucose-Capillary: 167 mg/dL — ABNORMAL HIGH (ref 70–99)
Glucose-Capillary: 209 mg/dL — ABNORMAL HIGH (ref 70–99)
Glucose-Capillary: 216 mg/dL — ABNORMAL HIGH (ref 70–99)
Glucose-Capillary: 218 mg/dL — ABNORMAL HIGH (ref 70–99)
Glucose-Capillary: 223 mg/dL — ABNORMAL HIGH (ref 70–99)
Glucose-Capillary: 265 mg/dL — ABNORMAL HIGH (ref 70–99)

## 2011-07-18 LAB — RENAL FUNCTION PANEL
CO2: 22 mEq/L (ref 19–32)
Calcium: 7.6 mg/dL — ABNORMAL LOW (ref 8.4–10.5)
Creatinine, Ser: 0.94 mg/dL (ref 0.50–1.35)
GFR calc Af Amer: >90 mL/min (ref >90)
Glucose, Bld: 229 mg/dL — ABNORMAL HIGH (ref 70–99)
Phosphorus: 2.5 mg/dL (ref 2.3–4.6)
Sodium: 134 mEq/L — ABNORMAL LOW (ref 135–145)

## 2011-07-18 LAB — HEPARIN LEVEL (UNFRACTIONATED): Heparin Unfractionated: 0.35 IU/mL (ref 0.30–0.70)

## 2011-07-18 MED ORDER — HEPARIN (PORCINE) IN NACL 100-0.45 UNIT/ML-% IJ SOLN
1150.0000 [IU]/h | INTRAMUSCULAR | Status: DC
  Administered 2011-07-18: 1350 [IU]/h via INTRAVENOUS
  Administered 2011-07-19 – 2011-07-21 (×3): 1150 [IU]/h via INTRAVENOUS
  Filled 2011-07-18 (×6): qty 250

## 2011-07-18 MED ORDER — CLINIMIX E/DEXTROSE (5/20) 5 % IV SOLN
INTRAVENOUS | Status: AC
  Administered 2011-07-18: 17:00:00 via INTRAVENOUS
  Filled 2011-07-18: qty 2000

## 2011-07-18 MED ORDER — METOPROLOL TARTRATE 1 MG/ML IV SOLN
10.0000 mg | Freq: Four times a day (QID) | INTRAVENOUS | Status: DC
  Administered 2011-07-18 – 2011-07-23 (×19): 10 mg via INTRAVENOUS
  Filled 2011-07-18 (×25): qty 10

## 2011-07-18 NOTE — Progress Notes (Signed)
8 Days Post-Op  Subjective: States that he is about the same.  No nausea  Objective: Vital signs in last 24 hours: Temp:  [98.1 F (36.7 C)-99.1 F (37.3 C)] 98.1 F (36.7 C) (03/16 0800) Pulse Rate:  [116-137] 120  (03/16 0700) Resp:  [18-23] 18  (03/16 0500) BP: (140-174)/(78-95) 157/78 mmHg (03/16 0700) SpO2:  [97 %-100 %] 100 % (03/16 0700) Last BM Date: 07/16/11  Intake/Output from previous day: 03/15 0701 - 03/16 0700 In: 3122.5 [I.V.:867.5; IV Piggyback:400; TPN:1855] Out: 3850 [Urine:3500; Stool:350] Intake/Output this shift: Total I/O In: 445.5 [I.V.:190.5; TPN:255] Out: 600 [Urine:600]  General appearance: alert, cooperative and no distress Resp: clear to auscultation bilaterally Cardio: tachycardic, regular GI: soft, no apparent tenderness on exam but complains of tenderness at the top of the main incision, distended with some tymapny but not much output., no peritonitis Extremities: scd's bilat.  Lab Results:  No results found for this basename: WBC:2,HGB:2,HCT:2,PLT:2 in the last 72 hours BMET  Basename 07/18/11 0415 07/17/11 0500  NA 134* 132*  K 3.6 3.6  CL 106 104  CO2 22 23  GLUCOSE 229* 220*  BUN 16 16  CREATININE 0.94 0.92  CALCIUM 7.6* 7.6*   PT/INR  Basename 07/18/11 0415 07/17/11 0925  LABPROT 18.8* 18.4*  INR 1.54* 1.50*   ABG No results found for this basename: PHART:2,PCO2:2,PO2:2,HCO3:2 in the last 72 hours  Studies/Results: Dg Abd Portable 1v  07/17/2011  *RADIOLOGY REPORT*  Clinical Data: Ileus.  PORTABLE ABDOMEN - 1 VIEW  Comparison: 07/09/2011  Findings: NG tube tip is in the distal stomach.  There has been resolution of the dilated small bowel loops.  There is a small amount of air scattered throughout the bowel.  Abdominal mesh is in place.  Rotoscoliosis of lumbar spine.  IMPRESSION: Resolution of small bowel distention.  Original Report Authenticated By: Gwynn Burly, M.D.    Anti-infectives: Anti-infectives     Start      Dose/Rate Route Frequency Ordered Stop   07/15/11 0900   vancomycin (VANCOCIN) IVPB 1000 mg/200 mL premix  Status:  Discontinued        1,000 mg 200 mL/hr over 60 Minutes Intravenous Every 12 hours 07/15/11 0407 07/16/11 1325   07/12/11 1800   metroNIDAZOLE (FLAGYL) IVPB 500 mg        500 mg 100 mL/hr over 60 Minutes Intravenous 4 times per day 07/12/11 1645     07/12/11 1530   vancomycin (VANCOCIN) 500 mg in sodium chloride irrigation 0.9 % 100 mL enema        500 mg 66.7 mL/hr over 90 Minutes Rectal 4 times per day 07/12/11 1450 07/13/11 0108   07/11/11 2359   vancomycin (VANCOCIN) IVPB 1000 mg/200 mL premix  Status:  Discontinued        1,000 mg 200 mL/hr over 60 Minutes Intravenous Every 24 hours 07/11/11 0807 07/15/11 0407   07/10/11 1800   vancomycin (VANCOCIN) 500 mg in sodium chloride 0.9 % 100 mL IVPB  Status:  Discontinued        500 mg 100 mL/hr over 60 Minutes Intravenous Every 12 hours 07/10/11 1641 07/11/11 0806   07/10/11 1800   piperacillin-tazobactam (ZOSYN) IVPB 3.375 g  Status:  Discontinued        3.375 g 12.5 mL/hr over 240 Minutes Intravenous Every 8 hours 07/10/11 1708 07/15/11 0720   07/10/11 0800   ertapenem (INVANZ) 1 g in sodium chloride 0.9 % 50 mL IVPB  Status:  Discontinued  1 g 100 mL/hr over 30 Minutes Intravenous Every 24 hours 07/10/11 0739 07/10/11 1659   06/26/11 2000   ceFAZolin (ANCEF) IVPB 1 g/50 mL premix        1 g 100 mL/hr over 30 Minutes Intravenous Every 6 hours 06/26/11 1658 06/27/11 0857          Assessment/Plan: s/p Procedure(s) (LRB): LAPAROSCOPY DIAGNOSTIC (N/A) LYSIS OF ADHESION (N/A) IMPACTION REMOVAL (N/A) Not much has come out of NG and his xray looked better yesterday but I would not remove it today since he is still distended on exam. Still with loose stools. continue flagyl for c. diff colitis.  advance NG  LOS: 24 days    Lodema Pilot DAVID 07/18/2011

## 2011-07-18 NOTE — Progress Notes (Signed)
PARENTERAL NUTRITION CONSULT NOTE - FOLLOW UP  Pharmacy Consult for TNA Indication: SBO  No Known Allergies  Patient Measurements: Height: 5\' 6"  (167.6 cm) Weight: 162 lb 4.1 oz (73.6 kg) IBW/kg (Calculated) : 63.8  Usual Weight: unable to assess  Vital Signs: Temp: 99.1 F (37.3 C) (03/16 0000) Temp src: Oral (03/16 0000) BP: 157/78 mmHg (03/16 0700) Pulse Rate: 120  (03/16 0700)  Intake/Output from previous day: 03/15 0701 - 03/16 0700 In: 3122.5 [I.V.:867.5; IV Piggyback:400; TPN:1855] Out: 3850 [Urine:3500; Stool:350]  Labs:  [facility] 07/18/11 0415 07/17/11 0925 07/16/11 0730  WBC -- -- --  HGB -- -- --  HCT -- -- --  PLT -- -- --  APTT -- -- --  INR 1.54* 1.50* 1.50*     Basename 07/18/11 0415 07/17/11 0500 07/16/11 0730  NA 134* 132* 133*  K 3.6 3.6 3.0*  CL 106 104 104  CO2 22 23 23   GLUCOSE 229* 220* 206*  BUN 16 16 15   CREATININE 0.94 0.92 0.97  LABCREA -- -- --  CREAT24HRUR -- -- --  CALCIUM 7.6* 7.6* 7.5*  MG -- 1.6 1.5  PHOS 2.5 2.3 2.6  PROT -- -- 5.1*  ALBUMIN 1.4* -- 1.4*  AST -- -- 13  ALT -- -- 8  ALKPHOS -- -- 66  BILITOT -- -- 0.2*  BILIDIR -- -- --  IBILI -- -- --  PREALBUMIN -- -- --  TRIG -- -- --  CHOLHDL -- -- --  CHOL -- -- --   Estimated Creatinine Clearance: 61.3 ml/min (by C-G formula based on Cr of 0.94).   Medications:  Scheduled:     . antiseptic oral rinse  15 mL Mouth Rinse q12n4p  . chlorhexidine  15 mL Mouth Rinse BID  . insulin aspart  0-15 Units Subcutaneous Q4H  . lip balm  1 application Topical BID  . metoprolol  5 mg Intravenous Q6H  . metronidazole  500 mg Intravenous Q6H  . pantoprazole (PROTONIX) IV  40 mg Intravenous Q24H  . sodium chloride  10-40 mL Intracatheter Q12H   IV Fluid:  NS with 30K at 65ml/hr   Nutritional Goals:   1850-2110 kCal, 79-92 grams of protein per day  Clinimix E 5/20 80ml/hr + 20% Lipids 56ml/hr will provide 90gm protein, 1584 kcal on non-lipid days, 2064 kcal on  lipid days, average 1790 kcal/week  Current Nutrition:  Clinimix E5/20 at 75 ml/hr +/- IV lipids at 65ml/hr on MWF. NPO  Assessment:  64 YOM s/p ORIF 2/22, GI bleed 2/28 thought to be due to diverticular disease, CT 3/4 consistent with SBO. NG tube with significant output. Last meal recorded 2/28.  POD#8 LOA, serosal/enterotomy repair, fecal disimpaction.  Tolerating TNA at goal rate of 75 ml/hr  Lytes: K remains wnl after total IV KCl on 3/14, plus K+ in IVF (has insulin in TNA). Na+ improving. Correct calcium wnl (9.7). Phos wnl.  Prealbumin: baseline = 5.4, on 3/11 it was < 3; Lipids have remained normal  CBGs: Range 184-225 last 24 hours.CBGs continue to slowly trend down, but still above goal range. Was given 31 units SSI q4h in last 24 hours, plus getting another 32 units insulin over 24 hours from TNA.  IVF: TNA @ 75ml/hr + IV lipids @ 68ml/hr on MWF+ NS30K @ 17ml/hr (total fluid = 135 ml/hr on MWF, 125 ml/hr on TuThSS)  GI Prophylaxis: IV Protonix, Reglan IV q8h prn  Plan:   Continue Clinimix E5/20 at goal rate of 75  ml/hr   Increase insulin to 61 units/2L bag (~70% increase in insulin content). Anticipate patient will receive 55 of those insulin units over 24 hours, beginning at 18:00 tonight. Fat emulsion at 10 ml/hr, standard multivitamins and trace elements on MWF only due to ongoing shortage. IVF per MD Watch CBGs for full 24 hours after 70% increase in insulin content of TNA tonight. If CBGs continue to remain elevated, may consider starting low-dose Lantus in the next few days TNA lab panels on Mondays & Thursdays.  Darrol Angel, PharmD Pager: [phone] 07/18/2011 7:38 AM

## 2011-07-18 NOTE — Progress Notes (Addendum)
ANTICOAGULATION CONSULT NOTE - Follow Up Consult  Pharmacy Consult for Heparin Indication: VTE, Afib, and r/o CVA  No Known Allergies  Patient Measurements: Height: 5\' 6"  (167.6 cm) Weight: 162 lb 4.1 oz (73.6 kg) IBW/kg (Calculated) : 63.8   Vital Signs: Temp: 99.1 F (37.3 C) (03/16 0000) Temp src: Oral (03/16 0000) BP: 158/92 mmHg (03/16 0100) Pulse Rate: 136  (03/16 0100)  Labs:  Basename 07/18/11 0415 07/17/11 1745 07/17/11 0925 07/17/11 0500 07/16/11 0730  HGB -- -- -- -- --  HCT -- -- -- -- --  PLT -- -- -- -- --  APTT -- -- -- -- --  LABPROT 18.8* -- 18.4* -- 18.4*  INR 1.54* -- 1.50* -- 1.50*  HEPARINUNFRC 0.35 0.26* 0.34 -- --  CREATININE 0.94 -- -- 0.92 0.97  CKTOTAL -- -- -- -- --  CKMB -- -- -- -- --  TROPONINI -- -- -- -- --   Estimated Creatinine Clearance: 61.3 ml/min (by C-G formula based on Cr of 0.94).   Medications:  Scheduled:     . antiseptic oral rinse  15 mL Mouth Rinse q12n4p  . chlorhexidine  15 mL Mouth Rinse BID  . insulin aspart  0-15 Units Subcutaneous Q4H  . lip balm  1 application Topical BID  . metoprolol  5 mg Intravenous Q6H  . metronidazole  500 mg Intravenous Q6H  . pantoprazole (PROTONIX) IV  40 mg Intravenous Q24H  . sodium chloride  10-40 mL Intracatheter Q12H    Assessment:  78 yo M, admit on chronic warfarin for history of VTE, s/p ORIF on 2/22, ex-lap with LOA, enterotomy 3/8, currently on heparin drip for PE, Afib, and CVA   Heparin infusing at 1350 units/hr, no interruptions or complications noted  Heparin level therapeutic (0.35)  Goal of Therapy:  Heparin level 0.3-0.5   Plan:   Continue heparin infusion at1350 units/hr (13.5 ml/hr)  Recheck level in 8 hrs to confirm dose.  Per neurology note, should wait 5-10 days after CVA before starting Coumadin (if we are going to use) due to risk of conversion of sizeable infarct.    Terrilee Files, PharmD 07/18/2011 5:41 AM  ADDENDUM: Repeat heparin  level = 0.55, just above goal range 0.3-0.5. Continuously going back and forth between heparin 1300 units/hr and 1350 ml/hr. Tried to order an in-between rate in hopes of regulating and maintaining a therapeutic heparin level (1320 units/hr) but cannot enter this order due to Epic order entry rounding rules (rounds it down to 1300 units/hr).   Plan: 1) Decrease heparin to 1300 units/hr (13 ml/hr) 2) Recheck heparin level in 8 hours  Darrol Angel, PharmD Pager: [phone] 07/18/2011 1:25 PM

## 2011-07-18 NOTE — Progress Notes (Signed)
Subjective: "I am having a better today than yesterday." Does not offer any complaints in regard to pain or dyspnea. Does note that his abd is tender to the touch.   Objective:  Vital Signs: Filed Vitals:   07/18/11 0300 07/18/11 0500 07/18/11 0700 07/18/11 0800  BP: 140/80 163/88 157/78   Pulse: 125 128 120   Temp:    98.1 F (36.7 C)  TempSrc:    Oral  Resp: 19 18    Height:      Weight:      SpO2: 99% 98% 100%      EXAM: ABD; mild diffuse tenderness, ? Worse in LUQ/left abdomen.  NG output: 300 cc/24 hours Rectal tube: 350 cc/24 hours - very liquid   Intake/Output Summary (Last 24 hours) at 07/18/11 0929 Last data filed at 07/18/11 0900  Gross per 24 hour  Intake 3243.5 ml  Output   3850 ml  Net -606.5 ml    Lab Results:  Basename 07/18/11 0415 07/17/11 0500 07/16/11 0730  NA 134* 132* --  K 3.6 3.6 --  CL 106 104 --  CO2 22 23 --  GLUCOSE 229* 220* --  BUN 16 16 --  CREATININE 0.94 0.92 --  CALCIUM 7.6* 7.6* --  MG -- 1.6 1.5  PHOS 2.5 2.3 --    Basename 07/18/11 0415 07/16/11 0730  AST -- 13  ALT -- 8  ALKPHOS -- 66  BILITOT -- 0.2*  PROT -- 5.1*  ALBUMIN 1.4* 1.4*   No results found for this basename: LIPASE:2,AMYLASE:2 in the last 72 hours No results found for this basename: WBC:2,NEUTROABS:2,HGB:2,HCT:2,MCV:2,PLT:2 in the last 72 hours No results found for this basename: CKTOTAL:3,CKMB:3,CKMBINDEX:3,TROPONINI:3 in the last 72 hours No components found with this basename: POCBNP:3 No results found for this basename: DDIMER:2 in the last 72 hours No results found for this basename: HGBA1C:2 in the last 72 hours No results found for this basename: CHOL:2,HDL:2,LDLCALC:2,TRIG:2,CHOLHDL:2,LDLDIRECT:2 in the last 72 hours No results found for this basename: TSH,T4TOTAL,FREET3,T3FREE,THYROIDAB in the last 72 hours No results found for this basename: VITAMINB12:2,FOLATE:2,FERRITIN:2,TIBC:2,IRON:2,RETICCTPCT:2 in the last 72 hours  Studies/Results: Dg  Abd Portable 1v  07/17/2011  *RADIOLOGY REPORT*  Clinical Data: Ileus.  PORTABLE ABDOMEN - 1 VIEW  Comparison: 07/09/2011  Findings: NG tube tip is in the distal stomach.  There has been resolution of the dilated small bowel loops.  There is a small amount of air scattered throughout the bowel.  Abdominal mesh is in place.  Rotoscoliosis of lumbar spine.  IMPRESSION: Resolution of small bowel distention.  Original Report Authenticated By: Gwynn Burly, M.D.   Medications: Scheduled Meds:   . antiseptic oral rinse  15 mL Mouth Rinse q12n4p  . chlorhexidine  15 mL Mouth Rinse BID  . insulin aspart  0-15 Units Subcutaneous Q4H  . lip balm  1 application Topical BID  . metoprolol  5 mg Intravenous Q6H  . metronidazole  500 mg Intravenous Q6H  . pantoprazole (PROTONIX) IV  40 mg Intravenous Q24H  . sodium chloride  10-40 mL Intracatheter Q12H   Continuous Infusions:   . fat emulsion 250 mL (07/17/11 1800)  . heparin 1,350 Units/hr (07/18/11 0900)  . 0.9 % sodium chloride with kcl 50 mL/hr at 07/17/11 1900  . TPN (CLINIMIX) +/- additives 75 mL/hr at 07/16/11 1720  . TPN (CLINIMIX) +/- additives    . TPN (CLINIMIX) +/- additives 75 mL/hr at 07/17/11 1800  . DISCONTD: heparin 1,300 Units/hr (07/17/11 0130)   PRN  Meds:.acetaminophen (TYLENOL) oral liquid 160 mg/5 mL, fentaNYL, metoCLOPramide (REGLAN) injection, phenol, sodium chloride  Assessment/Plan: Active Problems:  PE (pulmonary embolism) - on anticoagulation  Fracture of hip, right, closed  SBO (small bowel obstruction) - xray better and NG output is down. Will d/c suction but leave NG in place. He is tender - not sure what that means.   Acute respiratory failure with hypoxia - seems to be stable.   Tachycardia - seems to be in sinus tach (very regular but varying slowly in speed, therefore, not Afib and not PAT/SVT). On metoprolol. I asked nurse to observe if that is having any effect and for how long.   A-fib   LOS: 24 days    [name] 07/18/2011, 9:29 AM

## 2011-07-19 ENCOUNTER — Inpatient Hospital Stay (HOSPITAL_COMMUNITY): Payer: Medicare HMO

## 2011-07-19 LAB — BASIC METABOLIC PANEL
BUN: 16 mg/dL (ref 6–23)
CO2: 23 mEq/L (ref 19–32)
Calcium: 7.6 mg/dL — ABNORMAL LOW (ref 8.4–10.5)
Chloride: 103 mEq/L (ref 96–112)
Creatinine, Ser: 0.98 mg/dL (ref 0.50–1.35)
GFR calc Af Amer: >90 mL/min (ref >90)
GFR calc non Af Amer: 78 mL/min — ABNORMAL LOW (ref >90)
Glucose, Bld: 189 mg/dL — ABNORMAL HIGH (ref 70–99)
Potassium: 3.4 mEq/L — ABNORMAL LOW (ref 3.5–5.1)
Sodium: 133 mEq/L — ABNORMAL LOW (ref 135–145)

## 2011-07-19 LAB — CBC
HCT: 26.4 % — ABNORMAL LOW (ref 39.0–52.0)
Hemoglobin: 8.7 g/dL — ABNORMAL LOW (ref 13.0–17.0)
MCH: 28 pg (ref 26.0–34.0)
MCHC: 33 g/dL (ref 30.0–36.0)
MCV: 84.9 fL (ref 78.0–100.0)
Platelets: 370 10*3/uL (ref 150–400)
RBC: 3.11 MIL/uL — ABNORMAL LOW (ref 4.22–5.81)
RDW: 16.6 % — ABNORMAL HIGH (ref 11.5–15.5)
WBC: 9.1 10*3/uL (ref 4.0–10.5)

## 2011-07-19 LAB — GLUCOSE, CAPILLARY
Glucose-Capillary: 199 mg/dL — ABNORMAL HIGH (ref 70–99)
Glucose-Capillary: 190 mg/dL — ABNORMAL HIGH (ref 70–99)

## 2011-07-19 LAB — DIFFERENTIAL
Basophils Absolute: 0 10*3/uL (ref 0.0–0.1)
Basophils Relative: 0 % (ref 0–1)
Eosinophils Absolute: 0.2 10*3/uL (ref 0.0–0.7)
Eosinophils Relative: 2 % (ref 0–5)
Lymphocytes Relative: 20 % (ref 12–46)
Lymphs Abs: 1.8 10*3/uL (ref 0.7–4.0)
Monocytes Absolute: 0.5 10*3/uL (ref 0.1–1.0)
Monocytes Relative: 6 % (ref 3–12)
Neutro Abs: 6.6 10*3/uL (ref 1.7–7.7)
Neutrophils Relative %: 72 % (ref 43–77)

## 2011-07-19 LAB — PROTIME-INR
INR: 1.68 — ABNORMAL HIGH (ref 0.00–1.49)
Prothrombin Time: 20.1 seconds — ABNORMAL HIGH (ref 11.6–15.2)

## 2011-07-19 LAB — HEPARIN LEVEL (UNFRACTIONATED)
Heparin Unfractionated: 0.34 IU/mL (ref 0.30–0.70)
Heparin Unfractionated: 0.85 IU/mL — ABNORMAL HIGH (ref 0.30–0.70)

## 2011-07-19 MED ORDER — INSULIN REGULAR HUMAN 100 UNIT/ML IJ SOLN
INTRAMUSCULAR | Status: AC
  Administered 2011-07-19: 18:00:00 via INTRAVENOUS
  Filled 2011-07-19: qty 2000

## 2011-07-19 MED ORDER — SODIUM CHLORIDE 0.9 % IV SOLN: INTRAVENOUS | Status: DC

## 2011-07-19 MED ORDER — POTASSIUM CHLORIDE 10 MEQ/100ML IV SOLN
10.0000 meq | INTRAVENOUS | Status: AC
  Administered 2011-07-19 (×3): 10 meq via INTRAVENOUS
  Filled 2011-07-19: qty 300

## 2011-07-19 NOTE — Progress Notes (Addendum)
ANTICOAGULATION CONSULT NOTE - Follow Up Consult  Pharmacy Consult for Heparin Indication: VTE, Afib, and r/o CVA  No Known Allergies  Patient Measurements: Height: 5\' 6"  (167.6 cm) Weight: 162 lb 4.1 oz (73.6 kg) IBW/kg (Calculated) : 63.8   Vital Signs: Temp: 98.2 F (36.8 C) (03/17 0000) Temp src: Oral (03/17 0000) BP: 156/81 mmHg (03/16 2341) Pulse Rate: 114  (03/17 0000)  Labs:  Basename 07/18/11 2330 07/18/11 1230 07/18/11 0415 07/17/11 0925 07/17/11 0500 07/16/11 0730  HGB -- -- -- -- -- --  HCT -- -- -- -- -- --  PLT -- -- -- -- -- --  APTT -- -- -- -- -- --  LABPROT -- -- 18.8* 18.4* -- 18.4*  INR -- -- 1.54* 1.50* -- 1.50*  HEPARINUNFRC 0.85* 0.55 0.35 -- -- --  CREATININE -- -- 0.94 -- 0.92 0.97  CKTOTAL -- -- -- -- -- --  CKMB -- -- -- -- -- --  TROPONINI -- -- -- -- -- --   Estimated Creatinine Clearance: 61.3 ml/min (by C-G formula based on Cr of 0.94).   Medications:  Scheduled:     . antiseptic oral rinse  15 mL Mouth Rinse q12n4p  . chlorhexidine  15 mL Mouth Rinse BID  . insulin aspart  0-15 Units Subcutaneous Q4H  . lip balm  1 application Topical BID  . metoprolol  10 mg Intravenous Q6H  . metronidazole  500 mg Intravenous Q6H  . pantoprazole (PROTONIX) IV  40 mg Intravenous Q24H  . sodium chloride  10-40 mL Intracatheter Q12H  . DISCONTD: metoprolol  5 mg Intravenous Q6H    Assessment:  78 yo M, admit on chronic warfarin for history of VTE, s/p ORIF on 2/22, ex-lap with LOA, enterotomy 3/8, currently on heparin drip for PE, Afib, and CVA   Heparin level = 0.85 on 1300 units/ hr (which is a significant increase from last heparin level of 0.55 on Heparin @ 1350 units/hr)   Verified with RN that heparin running at correct rate, heparin level drawn appropriately.  No issues with heparin infusion noted.  Goal of Therapy:  Heparin level 0.3-0.5   Plan:   Decrease heparin infusion to 1150 units/hr (11.5 ml/hr)  Recheck level in 8 hrs to  confirm dose.  Per neurology note, should wait 5-10 days after CVA before starting Coumadin (if we are going to use) due to risk of conversion of sizeable infarct.    Terrilee Files, PharmD 07/19/2011 12:50 AM   ADDENDUM:  Heparin level 0.41, back in goal range for CVA. Confirmed with RN that heparin continues to infuse at 1150 units/hr (11.5 ml/hr). Will continue this current heparin rate and recheck a heparin level in 8 hours (17:30).   Darrol Angel, PharmD Pager: [phone] 07/19/2011 11:14 AM

## 2011-07-19 NOTE — Progress Notes (Signed)
ANTICOAGULATION CONSULT NOTE - Follow Up Consult  Pharmacy Consult for Heparin Indication: VTE, Afib, and r/o CVA  No Known Allergies  Patient Measurements: Height: 5\' 6"  (167.6 cm) Weight: 155 lb 6.8 oz (70.5 kg) IBW/kg (Calculated) : 63.8   Vital Signs: Temp: 97.7 F (36.5 C) (03/17 1600) Temp src: Oral (03/17 1600) BP: 147/87 mmHg (03/17 1500) Pulse Rate: 125  (03/17 1500)  Labs:  Basename 07/19/11 1730 07/19/11 0935 07/19/11 0457 07/18/11 2330 07/18/11 0415 07/17/11 0925 07/17/11 0500  HGB -- -- 8.7* -- -- -- --  HCT -- -- 26.4* -- -- -- --  PLT -- -- 370 -- -- -- --  APTT -- -- -- -- -- -- --  LABPROT -- -- 20.1* -- 18.8* 18.4* --  INR -- -- 1.68* -- 1.54* 1.50* --  HEPARINUNFRC 0.34 0.41 -- 0.85* -- -- --  CREATININE -- -- 0.98 -- 0.94 -- 0.92  CKTOTAL -- -- -- -- -- -- --  CKMB -- -- -- -- -- -- --  TROPONINI -- -- -- -- -- -- --   Estimated Creatinine Clearance: 58.8 ml/min (by C-G formula based on Cr of 0.98).  Medications:  Scheduled:     . antiseptic oral rinse  15 mL Mouth Rinse q12n4p  . chlorhexidine  15 mL Mouth Rinse BID  . insulin aspart  0-15 Units Subcutaneous Q4H  . lip balm  1 application Topical BID  . metoprolol  10 mg Intravenous Q6H  . metronidazole  500 mg Intravenous Q6H  . pantoprazole (PROTONIX) IV  40 mg Intravenous Q24H  . potassium chloride  10 mEq Intravenous Q1 Hr x 3  . 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  Heparin level in target range on current rate. No bleeding reported/documented.  Goal of Therapy:  Heparin level 0.3-0.5   Plan:   Cont heparin infusion at 1150 units/hr (11.5 ml/hr)  Recheck level in am.  Waiting to start Coumadin per neurology.  Charolotte Eke, PharmD, pager [phone]. 07/19/2011,6:16 PM.

## 2011-07-19 NOTE — Progress Notes (Signed)
Subjective: Feels okay. Not nauseated after 24 hours of NG clamped. Not much abd pain.   About 100cc loose stool per shift last night  Objective:  Vital Signs: Filed Vitals:   07/19/11 0300 07/19/11 0400 07/19/11 0500 07/19/11 0600  BP: 145/81  145/81   Pulse: 125 132 131 113  Temp:  99 F (37.2 C)    TempSrc:  Oral    Resp: 20 21 20 18   Height:      Weight:      SpO2: 98% 98% 97% 99%     EXAM: ABD: slight distension but not tender.    Intake/Output Summary (Last 24 hours) at 07/19/11 0750 Last data filed at 07/19/11 0600  Gross per 24 hour  Intake 3524.28 ml  Output   3350 ml  Net 174.28 ml    Lab Results:  Basename 07/19/11 0457 07/18/11 0415 07/17/11 0500  NA 133* 134* --  K 3.4* 3.6 --  CL 103 106 --  CO2 23 22 --  GLUCOSE 189* 229* --  BUN 16 16 --  CREATININE 0.98 0.94 --  CALCIUM 7.6* 7.6* --  MG -- -- 1.6  PHOS -- 2.5 2.3    Basename 07/18/11 0415  AST --  ALT --  ALKPHOS --  BILITOT --  PROT --  ALBUMIN 1.4*   No results found for this basename: LIPASE:2,AMYLASE:2 in the last 72 hours  Basename 07/19/11 0457  WBC 9.1  NEUTROABS 6.6  HGB 8.7*  HCT 26.4*  MCV 84.9  PLT 370   No results found for this basename: CKTOTAL:3,CKMB:3,CKMBINDEX:3,TROPONINI:3 in the last 72 hours No components found with this basename: POCBNP:3 No results found for this basename: DDIMER:2 in the last 72 hours No results found for this basename: HGBA1C:2 in the last 72 hours No results found for this basename: CHOL:2,HDL:2,LDLCALC:2,TRIG:2,CHOLHDL:2,LDLDIRECT:2 in the last 72 hours No results found for this basename: TSH,T4TOTAL,FREET3,T3FREE,THYROIDAB in the last 72 hours No results found for this basename: VITAMINB12:2,FOLATE:2,FERRITIN:2,TIBC:2,IRON:2,RETICCTPCT:2 in the last 72 hours  Studies/Results: Dg Abd Portable 1v  07/17/2011  *RADIOLOGY REPORT*  Clinical Data: Ileus.  PORTABLE ABDOMEN - 1 VIEW  Comparison: 07/09/2011  Findings: NG tube tip is in the  distal stomach.  There has been resolution of the dilated small bowel loops.  There is a small amount of air scattered throughout the bowel.  Abdominal mesh is in place.  Rotoscoliosis of lumbar spine.  IMPRESSION: Resolution of small bowel distention.  Original Report Authenticated By: Gwynn Burly, M.D.   Medications: Scheduled Meds:   . antiseptic oral rinse  15 mL Mouth Rinse q12n4p  . chlorhexidine  15 mL Mouth Rinse BID  . insulin aspart  0-15 Units Subcutaneous Q4H  . lip balm  1 application Topical BID  . metoprolol  10 mg Intravenous Q6H  . metronidazole  500 mg Intravenous Q6H  . pantoprazole (PROTONIX) IV  40 mg Intravenous Q24H  . sodium chloride  10-40 mL Intracatheter Q12H  . DISCONTD: metoprolol  5 mg Intravenous Q6H   Continuous Infusions:   . fat emulsion 250 mL (07/17/11 1800)  . heparin 1,150 Units/hr (07/19/11 0100)  . 0.9 % sodium chloride with kcl 50 mL/hr at 07/19/11 0400  . TPN (CLINIMIX) +/- additives 75 mL/hr at 07/18/11 1800  . TPN (CLINIMIX) +/- additives 75 mL/hr at 07/17/11 1800  . DISCONTD: heparin 1,350 Units/hr (07/18/11 1100)   PRN Meds:.acetaminophen (TYLENOL) oral liquid 160 mg/5 mL, fentaNYL, metoCLOPramide (REGLAN) injection, phenol, sodium chloride  Assessment/Plan: Active  Problems:  PE (pulmonary embolism) - on anticoag  Chronic anticoagulation  Fall from standing  Fracture of hip, right, closed  SBO (small bowel obstruction) - has tolerated having NG off. Will recheck xray and if okay, pull NG tube  Fecal impaction in rectum  Chronic constipation  Acute respiratory failure with hypoxia  Tachycardia - heart rate still up despite increased metoprolol. I can't find any additional inciting factor  A-fib - current rhythm is not Afib  Hypokalemia  - IV runs of K today.    LOS: 25 days   [name] 07/19/2011, 7:50 AM

## 2011-07-19 NOTE — Progress Notes (Signed)
9 Days Post-Op  Subjective: Seems to be improving.  States that pain improved.  No nausea while NG clamped.  Objective: Vital signs in last 24 hours: Temp:  [97.8 F (36.6 C)-99 F (37.2 C)] 98.6 F (37 C) (03/17 0800) Pulse Rate:  [108-133] 126  (03/17 0947) Resp:  [18-26] 22  (03/17 0947) BP: (142-174)/(78-102) 158/85 mmHg (03/17 0947) SpO2:  [95 %-99 %] 98 % (03/17 0947) Weight:  [155 lb 6.8 oz (70.5 kg)] 155 lb 6.8 oz (70.5 kg) (03/17 0000) Last BM Date: 07/19/11  Intake/Output from previous day: 03/16 0701 - 03/17 0700 In: 3574.3 [I.V.:1499.3; NG/GT:30; IV Piggyback:200; TPN:1845] Out: 3350 [Urine:3150; Emesis/NG output:200] Intake/Output this shift: Total I/O In: 584.5 [I.V.:84.5; IV Piggyback:200; TPN:300] Out: 555 [Urine:555]  General appearance: alert, cooperative and no distress GI: soft, no significant tenderness today, still has a slight amount of upper abdominal tympany but this is likely colonic given xray, no peritonitis  Lab Results:   [facility] 07/19/11 0457  WBC 9.1  HGB 8.7*  HCT 26.4*  PLT 370   BMET  Basename 07/19/11 0457 07/18/11 0415  NA 133* 134*  K 3.4* 3.6  CL 103 106  CO2 23 22  GLUCOSE 189* 229*  BUN 16 16  CREATININE 0.98 0.94  CALCIUM 7.6* 7.6*   PT/INR  Basename 07/19/11 0457 07/18/11 0415  LABPROT 20.1* 18.8*  INR 1.68* 1.54*   ABG No results found for this basename: PHART:2,PCO2:2,PO2:2,HCO3:2 in the last 72 hours  Studies/Results: Dg Abd 1 View  07/19/2011  *RADIOLOGY REPORT*  Clinical Data: Abdominal pain and distention  ABDOMEN - 1 VIEW  Comparison: Plain film 07/17/2011  Findings: NG tube is unchanged with tip in the stomach antrum. There is gas within the colon which is nondilated.  No dilated loops of small bowel.  There is gas in the rectum.  Multiple surgical clips.  IMPRESSION: No evidence of bowel obstruction.  No change.  Original Report Authenticated By: Genevive Bi, M.D.     Anti-infectives: Anti-infectives     Start     Dose/Rate Route Frequency Ordered Stop   07/15/11 0900   vancomycin (VANCOCIN) IVPB 1000 mg/200 mL premix  Status:  Discontinued        1,000 mg 200 mL/hr over 60 Minutes Intravenous Every 12 hours 07/15/11 0407 07/16/11 1325   07/12/11 1800   metroNIDAZOLE (FLAGYL) IVPB 500 mg        500 mg 100 mL/hr over 60 Minutes Intravenous 4 times per day 07/12/11 1645     07/12/11 1530   vancomycin (VANCOCIN) 500 mg in sodium chloride irrigation 0.9 % 100 mL enema        500 mg 66.7 mL/hr over 90 Minutes Rectal 4 times per day 07/12/11 1450 07/13/11 0108   07/11/11 2359   vancomycin (VANCOCIN) IVPB 1000 mg/200 mL premix  Status:  Discontinued        1,000 mg 200 mL/hr over 60 Minutes Intravenous Every 24 hours 07/11/11 0807 07/15/11 0407   07/10/11 1800   vancomycin (VANCOCIN) 500 mg in sodium chloride 0.9 % 100 mL IVPB  Status:  Discontinued        500 mg 100 mL/hr over 60 Minutes Intravenous Every 12 hours 07/10/11 1641 07/11/11 0806   07/10/11 1800   piperacillin-tazobactam (ZOSYN) IVPB 3.375 g  Status:  Discontinued        3.375 g 12.5 mL/hr over 240 Minutes Intravenous Every 8 hours 07/10/11 1708 07/15/11 0720   07/10/11 0800  ertapenem (INVANZ) 1 g in sodium chloride 0.9 % 50 mL IVPB  Status:  Discontinued        1 g 100 mL/hr over 30 Minutes Intravenous Every 24 hours 07/10/11 0739 07/10/11 1659   06/26/11 2000   ceFAZolin (ANCEF) IVPB 1 g/50 mL premix        1 g 100 mL/hr over 30 Minutes Intravenous Every 6 hours 06/26/11 1658 06/27/11 0857          Assessment/Plan: s/p Procedure(s) (LRB): LAPAROSCOPY DIAGNOSTIC (N/A) LYSIS OF ADHESION (N/A) IMPACTION REMOVAL (N/A) I think that it is reasonable to pull NG given xrays and exam, consider speech eval and diet if passes speech evaluation.  Abdominal symptoms seem much improved.  LOS: 25 days    [name] DAVID 07/19/2011

## 2011-07-19 NOTE — Progress Notes (Signed)
PARENTERAL NUTRITION CONSULT NOTE - FOLLOW UP  Pharmacy Consult for TNA Indication: SBO  No Known Allergies  Patient Measurements: Height: 5\' 6"  (167.6 cm) Weight: 155 lb 6.8 oz (70.5 kg) IBW/kg (Calculated) : 63.8  Usual Weight: unable to assess  Vital Signs: Temp: 98.6 F (37 C) (03/17 0800) Temp src: Oral (03/17 0800) BP: 158/85 mmHg (03/17 0947) Pulse Rate: 126  (03/17 0947)  Intake/Output from previous day: 03/16 0701 - 03/17 0700 In: 3574.3 [I.V.:1499.3; NG/GT:30; IV Piggyback:200; TPN:1845] Out: 3350 [Urine:3150; Emesis/NG output:200]  Labs:  [facility] 07/19/11 0457 07/18/11 0415 07/17/11 0925  WBC 9.1 -- --  HGB 8.7* -- --  HCT 26.4* -- --  PLT 370 -- --  APTT -- -- --  INR 1.68* 1.54* 1.50*     Basename 07/19/11 0457 07/18/11 0415 07/17/11 0500  NA 133* 134* 132*  K 3.4* 3.6 3.6  CL 103 106 104  CO2 23 22 23   GLUCOSE 189* 229* 220*  BUN 16 16 16   CREATININE 0.98 0.94 0.92  LABCREA -- -- --  CREAT24HRUR -- -- --  CALCIUM 7.6* 7.6* 7.6*  MG -- -- 1.6  PHOS -- 2.5 2.3  PROT -- -- --  ALBUMIN -- 1.4* --  AST -- -- --  ALT -- -- --  ALKPHOS -- -- --  BILITOT -- -- --  BILIDIR -- -- --  IBILI -- -- --  PREALBUMIN -- -- --  TRIG -- -- --  CHOLHDL -- -- --  CHOL -- -- --   Estimated Creatinine Clearance: 58.8 ml/min (by C-G formula based on Cr of 0.98).   Medications:  Scheduled:     . antiseptic oral rinse  15 mL Mouth Rinse q12n4p  . chlorhexidine  15 mL Mouth Rinse BID  . insulin aspart  0-15 Units Subcutaneous Q4H  . lip balm  1 application Topical BID  . metoprolol  10 mg Intravenous Q6H  . metronidazole  500 mg Intravenous Q6H  . pantoprazole (PROTONIX) IV  40 mg Intravenous Q24H  . potassium chloride  10 mEq Intravenous Q1 Hr x 3  . sodium chloride  10-40 mL Intracatheter Q12H  . DISCONTD: metoprolol  5 mg Intravenous Q6H   IV Fluid:  NS with 30K at 67ml/hr   Nutritional Goals:   1850-2110 kCal, 79-92 grams of protein per  day  Clinimix E 5/20 62ml/hr + 20% Lipids 9ml/hr will provide 90gm protein, 1584 kcal on non-lipid days, 2064 kcal on lipid days, average 1790 kcal/week  Current Nutrition:  Clinimix E5/20 at 75 ml/hr +/- IV lipids at 56ml/hr on MWF. NPO  Assessment:  73 YOM s/p ORIF 2/22, GI bleed 2/28 thought to be due to diverticular disease, CT 3/4 consistent with SBO. NG tube with significant output. Last meal recorded 2/28.  POD#9 LOA, serosal/enterotomy repair, fecal disimpaction.  Tolerating TNA at goal rate of 75 ml/hr  Lytes: K slightly low this am, IV KCl already ordered by MD. Na+ remains low. Correct calcium wnl (9.7).  Prealbumin: baseline = 5.4, on 3/11 it was < 3; Lipids have remained normal  CBGs: Range 162-265 last 24 hours. Since insulin content in TNA increased last night, CBG range improved to 162-209. CBGs continue to slowly trend down. Was given 29 units SSI q4h in last 24 hours, plus getting another 55 units insulin over 24 hours from TNA. Since this morning's CBG was up a little at 176, will add 50% of SSI usage to TNA tonight in hopes of  continuing steady downward CBG trend, instead of waiting until tomorrow night.  IVF: TNA @ 32ml/hr + IV lipids @ 2ml/hr on MWF+ NS30K @ 82ml/hr (total fluid = 135 ml/hr on MWF, 125 ml/hr on TuThSS)  GI Prophylaxis: IV Protonix, Reglan IV q8h prn  Plan:   Continue Clinimix E5/20 at goal rate of 75 ml/hr   Increase insulin to 77 units/2L bag. Anticipate patient will receive 70 of those insulin units over 24 hours, beginning at 18:00 tonight. Fat emulsion at 10 ml/hr, standard multivitamins and trace elements on MWF only due to ongoing shortage. IVF per MD Watch CBGs for full 24 hours after increase in insulin content of TNA tonight. If CBGs continue to remain elevated, may consider starting low-dose Lantus in the next few days - although improving nicely with addition to TNA - watch trend. TNA lab panels on Mondays &  Thursdays.  Darrol Angel, PharmD Pager: [phone] 07/19/2011 11:25 AM

## 2011-07-20 LAB — COMPREHENSIVE METABOLIC PANEL
BUN: 17 mg/dL (ref 6–23)
CO2: 23 mEq/L (ref 19–32)
Calcium: 7.8 mg/dL — ABNORMAL LOW (ref 8.4–10.5)
Chloride: 103 mEq/L (ref 96–112)
Creatinine, Ser: 0.98 mg/dL (ref 0.50–1.35)
GFR calc Af Amer: >90 mL/min (ref >90)
GFR calc non Af Amer: 78 mL/min — ABNORMAL LOW (ref >90)
Glucose, Bld: 154 mg/dL — ABNORMAL HIGH (ref 70–99)
Total Bilirubin: 0.2 mg/dL — ABNORMAL LOW (ref 0.3–1.2)

## 2011-07-20 LAB — DIFFERENTIAL
Eosinophils Absolute: 0.2 10*3/uL (ref 0.0–0.7)
Eosinophils Relative: 2 % (ref 0–5)
Lymphs Abs: 1.7 10*3/uL (ref 0.7–4.0)
Monocytes Absolute: 0.5 10*3/uL (ref 0.1–1.0)
Neutrophils Relative %: 71 % (ref 43–77)

## 2011-07-20 LAB — PHOSPHORUS: Phosphorus: 2.8 mg/dL (ref 2.3–4.6)

## 2011-07-20 LAB — GLUCOSE, CAPILLARY
Glucose-Capillary: 157 mg/dL — ABNORMAL HIGH (ref 70–99)
Glucose-Capillary: 159 mg/dL — ABNORMAL HIGH (ref 70–99)
Glucose-Capillary: 154 mg/dL — ABNORMAL HIGH (ref 70–99)

## 2011-07-20 LAB — CBC
HCT: 28.6 % — ABNORMAL LOW (ref 39.0–52.0)
Hemoglobin: 9.4 g/dL — ABNORMAL LOW (ref 13.0–17.0)
MCH: 28.2 pg (ref 26.0–34.0)
MCHC: 32.9 g/dL (ref 30.0–36.0)
MCV: 85.9 fL (ref 78.0–100.0)
Platelets: 384 10*3/uL (ref 150–400)
RBC: 3.33 MIL/uL — ABNORMAL LOW (ref 4.22–5.81)
RDW: 16.7 % — ABNORMAL HIGH (ref 11.5–15.5)
WBC: 8.3 10*3/uL (ref 4.0–10.5)

## 2011-07-20 LAB — HEPARIN LEVEL (UNFRACTIONATED): Heparin Unfractionated: 0.31 IU/mL (ref 0.30–0.70)

## 2011-07-20 LAB — PROTIME-INR: INR: 1.7 — ABNORMAL HIGH (ref 0.00–1.49)

## 2011-07-20 LAB — CHOLESTEROL, TOTAL: Cholesterol: 52 mg/dL (ref 0–200)

## 2011-07-20 MED ORDER — ALUM & MAG HYDROXIDE-SIMETH 200-200-20 MG/5ML PO SUSP
15.0000 mL | ORAL | Status: DC | PRN
  Administered 2011-07-20 (×3): 15 mL via ORAL
  Administered 2011-07-21 (×2): via ORAL
  Administered 2011-07-21 – 2011-08-03 (×16): 15 mL via ORAL
  Filled 2011-07-20 (×18): qty 30

## 2011-07-20 MED ORDER — FAT EMULSION 20 % IV EMUL
250.0000 mL | INTRAVENOUS | Status: AC
  Administered 2011-07-20: 250 mL via INTRAVENOUS
  Filled 2011-07-20: qty 250

## 2011-07-20 MED ORDER — TRACE MINERALS CR-CU-MN-SE-ZN 10-1000-500-60 MCG/ML IV SOLN
INTRAVENOUS | Status: AC
  Administered 2011-07-20: 18:00:00 via INTRAVENOUS
  Filled 2011-07-20: qty 2000

## 2011-07-20 NOTE — Progress Notes (Signed)
Subjective: No new problems of weekend, and NG tube out, no complaints of emesis. Patient does complain of some discomfort in his throat and also wondering when his staples will come out. We will obtain speech about cleared for p.o., hopefully start p.o. Diet today.  Objective: Vital signs in last 24 hours: Temp:  [97.7 F (36.5 C)-99.3 F (37.4 C)] 99.3 F (37.4 C) (03/18 0400) Pulse Rate:  [111-144] 121  (03/18 0500) Resp:  [20-25] 20  (03/18 0500) BP: (129-188)/(70-103) 144/83 mmHg (03/18 0500) SpO2:  [97 %-99 %] 98 % (03/18 0500) Weight change:  Last BM Date: 07/19/11 (flexiseal)  Intake/Output from previous day: 03/17 0701 - 03/18 0700 In: 3551.5 [I.V.:1201.5; IV Piggyback:700; TPN:1650] Out: 4385 [Urine:3910; Stool:475] Intake/Output this shift:    General appearance: alert and cooperative Resp: clear to auscultation bilaterally Cardio: tachycardic but regular GI: slightly distended, positive bowel sounds Extremities: extremities normal, atraumatic, no cyanosis or edema Neurologic: Motor: left hemiparesis, improved moment upper extremity greater than lower extremity  Lab Results:  Results for orders placed during the hospital encounter of 06/24/11 (from the past 24 hour(s))  HEPARIN LEVEL (UNFRACTIONATED)     Status: Normal   Collection Time   07/19/11  9:35 AM      Component Value Range   Heparin Unfractionated 0.41  0.30 - 0.70 (IU/mL)  GLUCOSE, CAPILLARY     Status: Abnormal   Collection Time   07/19/11 12:22 PM      Component Value Range   Glucose-Capillary 199 (*) 70 - 99 (mg/dL)   Comment 1 Notify RN     Comment 2 Documented in Chart    GLUCOSE, CAPILLARY     Status: Abnormal   Collection Time   07/19/11  4:10 PM      Component Value Range   Glucose-Capillary 190 (*) 70 - 99 (mg/dL)   Comment 1 Notify RN     Comment 2 Documented in Chart    HEPARIN LEVEL (UNFRACTIONATED)     Status: Normal   Collection Time   07/19/11  5:30 PM      Component Value Range     Heparin Unfractionated 0.34  0.30 - 0.70 (IU/mL)  GLUCOSE, CAPILLARY     Status: Abnormal   Collection Time   07/19/11  7:42 PM      Component Value Range   Glucose-Capillary 173 (*) 70 - 99 (mg/dL)   Comment 1 Documented in Chart     Comment 2 Notify RN    GLUCOSE, CAPILLARY     Status: Abnormal   Collection Time   07/19/11 11:56 PM      Component Value Range   Glucose-Capillary 152 (*) 70 - 99 (mg/dL)   Comment 1 Documented in Chart     Comment 2 Notify RN    PROTIME-INR     Status: Abnormal   Collection Time   07/20/11  4:15 AM      Component Value Range   Prothrombin Time 20.3 (*) 11.6 - 15.2 (seconds)   INR 1.70 (*) 0.00 - 1.49   COMPREHENSIVE METABOLIC PANEL     Status: Abnormal   Collection Time   07/20/11  4:15 AM      Component Value Range   Sodium 132 (*) 135 - 145 (mEq/L)   Potassium 3.6  3.5 - 5.1 (mEq/L)   Chloride 103  96 - 112 (mEq/L)   CO2 23  19 - 32 (mEq/L)   Glucose, Bld 154 (*) 70 - 99 (mg/dL)  BUN 17  6 - 23 (mg/dL)   Creatinine, Ser 4.09  0.50 - 1.35 (mg/dL)   Calcium 7.8 (*) 8.4 - 10.5 (mg/dL)   Total Protein 6.2  6.0 - 8.3 (g/dL)   Albumin 1.5 (*) 3.5 - 5.2 (g/dL)   AST 16  0 - 37 (U/L)   ALT 9  0 - 53 (U/L)   Alkaline Phosphatase 73  39 - 117 (U/L)   Total Bilirubin 0.2 (*) 0.3 - 1.2 (mg/dL)   GFR calc non Af Amer 78 (*) >90 (mL/min)   GFR calc Af Amer >90  >90 (mL/min)  MAGNESIUM     Status: Normal   Collection Time   07/20/11  4:15 AM      Component Value Range   Magnesium 1.6  1.5 - 2.5 (mg/dL)  PHOSPHORUS     Status: Normal   Collection Time   07/20/11  4:15 AM      Component Value Range   Phosphorus 2.8  2.3 - 4.6 (mg/dL)  DIFFERENTIAL     Status: Normal   Collection Time   07/20/11  4:15 AM      Component Value Range   Neutrophils Relative 71  43 - 77 (%)   Lymphocytes Relative 20  12 - 46 (%)   Monocytes Relative 6  3 - 12 (%)   Eosinophils Relative 2  0 - 5 (%)   Basophils Relative 1  0 - 1 (%)   Neutro Abs 5.8  1.7 - 7.7  (K/uL)   Lymphs Abs 1.7  0.7 - 4.0 (K/uL)   Monocytes Absolute 0.5  0.1 - 1.0 (K/uL)   Eosinophils Absolute 0.2  0.0 - 0.7 (K/uL)   Basophils Absolute 0.1  0.0 - 0.1 (K/uL)   WBC Morphology MILD LEFT SHIFT (1-5% METAS, OCC MYELO, OCC BANDS)    CHOLESTEROL, TOTAL     Status: Normal   Collection Time   07/20/11  4:15 AM      Component Value Range   Cholesterol 52  0 - 200 (mg/dL)  TRIGLYCERIDES     Status: Normal   Collection Time   07/20/11  4:15 AM      Component Value Range   Triglycerides 47  <150 (mg/dL)  HEPARIN LEVEL (UNFRACTIONATED)     Status: Normal   Collection Time   07/20/11  4:15 AM      Component Value Range   Heparin Unfractionated 0.31  0.30 - 0.70 (IU/mL)  CBC     Status: Abnormal   Collection Time   07/20/11  4:15 AM      Component Value Range   WBC 8.3  4.0 - 10.5 (K/uL)   RBC 3.33 (*) 4.22 - 5.81 (MIL/uL)   Hemoglobin 9.4 (*) 13.0 - 17.0 (g/dL)   HCT 81.1 (*) 91.4 - 52.0 (%)   MCV 85.9  78.0 - 100.0 (fL)   MCH 28.2  26.0 - 34.0 (pg)   MCHC 32.9  30.0 - 36.0 (g/dL)   RDW 78.2 (*) 95.6 - 15.5 (%)   Platelets 384  150 - 400 (K/uL)  GLUCOSE, CAPILLARY     Status: Abnormal   Collection Time   07/20/11  4:40 AM      Component Value Range   Glucose-Capillary 157 (*) 70 - 99 (mg/dL)   Comment 1 Documented in Chart     Comment 2 Notify RN        Studies/Results: Dg Abd 1 View  07/19/2011  *RADIOLOGY REPORT*  Clinical  Data: Abdominal pain and distention  ABDOMEN - 1 VIEW  Comparison: Plain film 07/17/2011  Findings: NG tube is unchanged with tip in the stomach antrum. There is gas within the colon which is nondilated.  No dilated loops of small bowel.  There is gas in the rectum.  Multiple surgical clips.  IMPRESSION: No evidence of bowel obstruction.  No change.  Original Report Authenticated By: Genevive Bi, M.[name].    Medications:  Scheduled:   . antiseptic oral rinse  15 mL Mouth Rinse q12n4p  . chlorhexidine  15 mL Mouth Rinse BID  . insulin aspart   0-15 Units Subcutaneous Q4H  . lip balm  1 application Topical BID  . metoprolol  10 mg Intravenous Q6H  . metronidazole  500 mg Intravenous Q6H  . pantoprazole (PROTONIX) IV  40 mg Intravenous Q24H  . potassium chloride  10 mEq Intravenous Q1 Hr x 3  . sodium chloride  10-40 mL Intracatheter Q12H   Continuous:   . sodium chloride    . fat emulsion    . heparin 1,150 Units/hr (07/19/11 1246)  . 0.9 % sodium chloride with kcl 50 mL/hr at 07/19/11 0400  . TPN (CLINIMIX) +/- additives 75 mL/hr at 07/18/11 1800  . TPN (CLINIMIX) +/- additives 75 mL/hr at 07/19/11 1744  . TPN (CLINIMIX) +/- additives      Assessment/Plan: CVA with left hemiparesis, continue heparin continue recommendations per neurology. 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 intake Status post lysis of adhesions, t ileus and small bowel obstruction resolved,, Start diet if Patient passes swallowing evaluation Hypokalemia, Now resolved,We will continue some potassium in IV fluids and potassium runs As needed  Acute on chronic kidney disease, Resolved  History of PE. Recent ultrasound shows chronic bilateral DVT  Status post GI bleed which required reversal of anticoagulation, EGD without definitive source, previous colonoscopy only revealed diverticulosis.  Anemia multifactorial , Hemoglobin stable no recurrent bleeding  Clostridium difficile, on Flagyl    Elevated CBGs, insulin has been added to TPN  .  Malnutrition continue TPN, Hopefully to start diet today  Decondition continue physical therapy     LOS: 26 days   [name] 07/20/2011, 8:17 AM

## 2011-07-20 NOTE — Progress Notes (Signed)
PARENTERAL NUTRITION CONSULT NOTE - FOLLOW UP  Pharmacy Consult for TNA Indication: SBO  No Known Allergies  Patient Measurements: Height: 5\' 6"  (167.6 cm) Weight: 155 lb 6.8 oz (70.5 kg) IBW/kg (Calculated) : 63.8  Usual Weight: unable to assess  Vital Signs: Temp: 99.3 F (37.4 C) (03/18 0400) Temp src: Oral (03/18 0400) BP: 144/83 mmHg (03/18 0500) Pulse Rate: 121  (03/18 0500)  Intake/Output from previous day: 03/17 0701 - 03/18 0700 In: 3551.5 [I.V.:1201.5; IV Piggyback:700; TPN:1650] Out: 4385 [Urine:3910; Stool:475]  Labs:  [facility] 07/20/11 0415 07/19/11 0457 07/18/11 0415  WBC 8.3 9.1 --  HGB 9.4* 8.7* --  HCT 28.6* 26.4* --  PLT 384 370 --  APTT -- -- --  INR 1.70* 1.68* 1.54*     Basename 07/20/11 0415 07/19/11 0457 07/18/11 0415  NA 132* 133* 134*  K 3.6 3.4* 3.6  CL 103 103 106  CO2 23 23 22   GLUCOSE 154* 189* 229*  BUN 17 16 16   CREATININE 0.98 0.98 0.94  LABCREA -- -- --  CREAT24HRUR -- -- --  CALCIUM 7.8* 7.6* 7.6*  MG 1.6 -- --  PHOS 2.8 -- 2.5  PROT 6.2 -- --  ALBUMIN 1.5* -- 1.4*  AST 16 -- --  ALT 9 -- --  ALKPHOS 73 -- --  BILITOT 0.2* -- --  BILIDIR -- -- --  IBILI -- -- --  PREALBUMIN -- -- --  TRIG -- -- --  CHOLHDL -- -- --  CHOL -- -- --   Estimated Creatinine Clearance: 58.8 ml/min (by C-G formula based on Cr of 0.98).   Medications:  Scheduled:     . antiseptic oral rinse  15 mL Mouth Rinse q12n4p  . chlorhexidine  15 mL Mouth Rinse BID  . insulin aspart  0-15 Units Subcutaneous Q4H  . lip balm  1 application Topical BID  . metoprolol  10 mg Intravenous Q6H  . metronidazole  500 mg Intravenous Q6H  . pantoprazole (PROTONIX) IV  40 mg Intravenous Q24H  . potassium chloride  10 mEq Intravenous Q1 Hr x 3  . sodium chloride  10-40 mL Intracatheter Q12H   IV Fluid:  NS with 30K at 34ml/hr   Nutritional Goals:   1850-2110 kCal, 79-92 grams of protein per day  Clinimix E 5/20 52ml/hr + 20% Lipids 30ml/hr will  provide 90gm protein, 1584 kcal on non-lipid days, 2064 kcal on lipid days, average 1790 kcal/week  Current Nutrition:  Clinimix E5/20 at 75 ml/hr +/- IV lipids at 65ml/hr on MWF. NPO  Assessment:  74 YOM s/p ORIF 2/22, GI bleed 2/28 thought to be due to diverticular disease, CT 3/4 consistent with SBO. NG tube with significant output. Last meal recorded 2/28.  POD#10 LOA, serosal/enterotomy repair, fecal disimpaction.  Tolerating TNA at goal rate of 75 ml/hr  Lytes: K slightly improved after replacement 3/17. Na+ remains low. Correct calcium wnl (9.8).  Prealbumin: baseline = 5.4, on 3/11 it was < 3; Pending today.  Lipids have remained normal  CBGs: Range 152-199 last 24 hours after significant increase insulin in TNA last night.  Continue to monitor today.  IVF: TNA @ 108ml/hr + IV lipids @ 90ml/hr on MWF+ NS30K @ 72ml/hr (total fluid = 135 ml/hr on MWF, 125 ml/hr on TuThSS)  GI Prophylaxis: IV Protonix, Reglan IV q8h prn  Plan:   Continue Clinimix E5/20 at goal rate of 75 ml/hr  Fat emulsion at 10 ml/hr, standard multivitamins and trace elements on MWF only due  to ongoing shortage. IVF per MD If CBGs continue to remain elevated, may consider starting low-dose Lantus in the next few days - although improving nicely with addition to TNA - watch trend. TNA lab panels on Mondays & Thursdays.   Loralee Pacas, PharmD, BCPS Pager: [phone] 07/20/2011 7:41 AM

## 2011-07-20 NOTE — Progress Notes (Signed)
ANTICOAGULATION CONSULT NOTE - Follow Up Consult  Pharmacy Consult for Heparin Indication: VTE, Afib, and r/o CVA  No Known Allergies  Patient Measurements: Height: 5\' 6"  (167.6 cm) Weight: 155 lb 6.8 oz (70.5 kg) IBW/kg (Calculated) : 63.8   Vital Signs: Temp: 99.3 F (37.4 C) (03/18 0400) Temp src: Oral (03/18 0400) BP: 144/83 mmHg (03/18 0500) Pulse Rate: 121  (03/18 0500)  Labs:  Basename 07/20/11 0415 07/19/11 1730 07/19/11 0935 07/19/11 0457 07/18/11 0415  HGB 9.4* -- -- 8.7* --  HCT 28.6* -- -- 26.4* --  PLT 384 -- -- 370 --  APTT -- -- -- -- --  LABPROT 20.3* -- -- 20.1* 18.8*  INR 1.70* -- -- 1.68* 1.54*  HEPARINUNFRC 0.31 0.34 0.41 -- --  CREATININE 0.98 -- -- 0.98 0.94  CKTOTAL -- -- -- -- --  CKMB -- -- -- -- --  TROPONINI -- -- -- -- --   Estimated Creatinine Clearance: 58.8 ml/min (by C-G formula based on Cr of 0.98).  Medications:  Scheduled:     . antiseptic oral rinse  15 mL Mouth Rinse q12n4p  . chlorhexidine  15 mL Mouth Rinse BID  . insulin aspart  0-15 Units Subcutaneous Q4H  . lip balm  1 application Topical BID  . metoprolol  10 mg Intravenous Q6H  . metronidazole  500 mg Intravenous Q6H  . pantoprazole (PROTONIX) IV  40 mg Intravenous Q24H  . potassium chloride  10 mEq Intravenous Q1 Hr x 3  . 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  Heparin level in target range on current rate, but falling  Of note, INR steadily rising: 1.7<1.68<1.54<1.5 No bleeding reported/documented.  Goal of Therapy:  Heparin level 0.3-0.5   Plan:   Cont heparin infusion at 1150 units/hr (11.5 ml/hr)  Recheck level in am.  Waiting to start Coumadin per neurology.  Loralee Pacas, PharmD, BCPS Pager: [phone] 07/20/2011,7:28 AM.

## 2011-07-20 NOTE — Progress Notes (Signed)
eLink Physician-Brief Progress Note Patient Name: [name] DOB: 10/14/1933 MRN: [id number]  Date of Service  07/20/2011   HPI/Events of Note   Due to medical stability Elink ok 'd change to stepdown status  eICU Interventions  Stepdown status ordered   Intervention Category Minor Interventions: Communication with other healthcare providers and/or family;Routine modifications to care plan (e.g. PRN medications for pain, fever)  Shan Levans 07/20/2011, 6:51 PM

## 2011-07-20 NOTE — Evaluation (Signed)
Clinical/Bedside Swallow Evaluation Patient Details  Name: [name] MRN: [id number] DOB: 1933-09-06 Today's Date: 07/20/2011  [id number]  Past Medical History:  Past Medical History  Diagnosis Date  . Asthma   . Hypertension   . Pulmonary embolism   . Peripheral artery disease   . Hypercholesteremia   . Bursitis of right shoulder   . DVT of leg (deep venous thrombosis)    Past Surgical History:  Past Surgical History  Procedure Date  . Hernia repair   . Appendectomy   . Femoral-femoral bypass graft   . Iliac artery stent   . Esophagogastroduodenoscopy 07/03/2011    Procedure: ESOPHAGOGASTRODUODENOSCOPY (EGD);  Surgeon: Shirley Friar, MD;  Location: [facility];  Service: Endoscopy;  Laterality: N/A;  . Compression hip screw 06/26/2011    Procedure: COMPRESSION HIP;  Surgeon: Raymon Mutton, MD;  Location: [facility];  Service: Orthopedics;  Laterality: Right;   HPI:  Pt is a 78 yo male adm to [facility] after falling at hospital with right femur fx, s/p ortho surgery with GI bleed and bowel obstruction- exploratory lap 07/10/11, EGD 07/03/11 small hiatal hernia.  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  Pt had NG placed and was removed yesterday per pt.     Assessment/Recommendations/Treatment Plan Suspected Esophageal Findings Suspected Esophageal Findings: Other (comment) (known h/o GERD-Hiatal hernia)  SLP Assessment Clinical Impression Statement: Pt observed swallowing single cracker bolus, applesauce x3 boluses, and water without overt clinical s/s of aspiration and with pt self feeding.  Swallow was timely with clear voice throughout.  Pt did exhibit mildly increased work of breathing, evidenced by pursed lip breathing after minimal intake, therefore SLP ended evaluation.  Rec consider initiating diet SLOWLY due to multiple asp pna factors - pt has not had po for 2 weeks, deconditioning, esophageal issues, dyspnea with  exertion, decr mobility, COPD and h/o reflux.  Pt also acknowledges h/o choking on food approx 6 months ago and having to take a break to recover.  Reflux reported by pt and he stated he took Nexium then changed to an alternative medication until pt was taken off a PPI completely a few months prior to admit.  Pt further acknowledges he was having some reflux symptoms at home PTA.  Asp risk is chronic d/t multiple risk factors, however  if pt is fully monitored and starts slowly, his tolerance may be improved.   Other Related Risk Factors: History of pneumonia;History of GERD;History of esophageal-related issues;Previous CVA;Decreased respiratory status;Decreased management of secretions (pt using oral suction to clear some secretions)  Swallow Evaluation Recommendations Liquid Consistency: Thin Liquid Administration via: Cup;Straw Medication Administration: Whole meds with puree (follow with water) Compensations: Slow rate;Small sips/bites (allow rest break if short of breath) Postural Changes and/or Swallow Maneuvers: Seated upright 90 degrees;Upright 30-60 min after meal Oral Care Recommendations: Oral care BID Other Recommendations: Have oral suction available Follow up Recommendations: Other (comment) (TBD)  Treatment Plan Speech Therapy Frequency: min 2x/week Treatment Duration: 2 weeks Interventions: Aspiration precaution training;Patient/family education;Trials of upgraded texture/liquids;Compensatory techniques  Prognosis Prognosis for Safe Diet Advancement: Fair Barriers to Reach Goals: Severity of dysphagia  Individuals Consulted Consulted and Agree with Results and Recommendations: Patient  Swallowing Goals  SLP Swallowing Goals Patient will utilize recommended strategies during swallow to increase swallowing safety with: Moderate assistance  General  Date of Onset: 07/20/11 HPI: Pt is a 78 yo male adm to [facility] after falling at hospital with right femur  fx, s/p ortho surgery  with GI bleed and bowel obstruction- exploratory lap 07/10/11, EGD 07/03/11 small hiatal hernia.  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  Pt had NG placed and was removed yesterday per pt.   Type of Study: Bedside swallow evaluation Diet Prior to this Study: NPO;TNA Respiratory Status: Room air Behavior/Cognition: Alert;Cooperative Oral Cavity - Dentition: Adequate natural dentition Vision: Functional for self-feeding Patient Positioning: Upright in chair Baseline Vocal Quality: Hoarse Volitional Cough: Strong Volitional Swallow: Able to elicit  Oral Motor/Sensory Function  Overall Oral Motor/Sensory Function: Appears within functional limits for tasks assessed (? some apraxia, pt unable to pucker labial musculature)  Consistency Results  Ice Chips Ice chips: Within functional limits  Thin Liquid Thin Liquid: Within functional limits Presentation: Cup;Straw;Self Fed;Spoon Other Comments: pt did report sensation x1 of stasis pointing to esophagus, reported it cleared  Nectar Thick Liquid Nectar Thick Liquid: Not tested  Honey Thick Liquid Honey Thick Liquid: Not tested  Puree Puree: Within functional limits Presentation: Self Fed;Spoon Other Comments: 3 tsps of applesauce  Solid Presentation: Self Fed Other Comments: cracker bolus, slow mastication which likely is compensatory for pt   [name], MS [facility] SLP [phone]

## 2011-07-20 NOTE — Progress Notes (Signed)
Physical Therapy Treatment Patient Details Name: [name] MRN: [id number] DOB: 30-Nov-1933 Today's Date: 07/20/2011  PT Assessment/Plan  PT - Assessment/Plan Comments on Treatment Session: Patient now demonstrating left UE/LE active movement.  HR max 114 with treatment.  Still limited activity tolerance, but able to support self better with sitting and with attempt for sit to stand.  Feel will need long term rehab offered at SNF or LTAC upon d/c. PT Plan: Discharge plan remains appropriate PT Frequency: Min 3X/week Follow Up Recommendations: Skilled nursing facility;LTACH Equipment Recommended: Defer to next venue PT Goals  Acute Rehab PT Goals Pt will Roll Supine to Left Side: with mod assist Pt will go Supine/Side to Sit: with mod assist PT Goal: Supine/Side to Sit - Progress: Progressing toward goal Pt will Sit at [facility] of Bed: 6-10 min;with min assist;with unilateral upper extremity support;with cues (comment type and amount) PT Goal: Sit at Edge Of Bed - Progress: Progressing toward goal Pt will go Sit to Stand: with +2 total assist (pt=50%) PT Goal: Sit to Stand - Progress: Goal set today Pt will go Stand to Sit: with +2 total assist (pt=40%) PT Goal: Stand to Sit - Progress: Goal set today Pt will Transfer Bed to Chair/Chair to Bed: with +2 total assist (pt=50%) PT Transfer Goal: Bed to Chair/Chair to Bed - Progress: Progressing toward goal Pt will Perform Home Exercise Program: with min assist PT Goal: Perform Home Exercise Program - Progress: Progressing toward goal  PT Treatment Precautions/Restrictions  Precautions Precautions: Fall Precaution Comments: S/P ORIF right hip--no precautions Required Braces or Orthoses: No Restrictions Weight Bearing Restrictions: Yes RLE Weight Bearing: Weight bearing as tolerated Other Position/Activity Restrictions: Pt aware he is WBAT Mobility (including Balance) Bed Mobility Rolling Left: 1: +2 Total assist Rolling Left Details  (indicate cue type and reason): pt=20% with cues, increased time, with rail Left Sidelying to Sit: 1: +2 Total assist Left Sidelying to Sit Details (indicate cue type and reason): pt=25% with facilitation for left UE activation in semisidelying with reaching for ceiling with right UE Sitting - Scoot to Edge of Bed: 1: +2 Total assist Sitting - Scoot to Edge of Bed Details (indicate cue type and reason): with scooting right hip forward on pad under patient while being supported in sitting Transfers Sit to Stand: 1: +2 Total assist;From elevated surface Sit to Stand Details (indicate cue type and reason): pt=10%; assist for left LE stability, and for trunk control. cues due to pushing to left Stand to Sit: 1: +2 Total assist;To bed Stand to Sit Details: pt dependent Squat Pivot Transfers: 1: +2 Total assist Squat Pivot Transfer Details (indicate cue type and reason): bed to recliner pt=10-15%  Posture/Postural Control Posture/Postural Control: Postural limitations Postural Limitations: head forward flexed and rotated left, right leg positioned out and to right in sitting with left side elongated with left lateral and posterior pelvice tilt Static Sitting Balance Static Sitting - Balance Support: Right upper extremity supported Static Sitting - Level of Assistance: 4: Min assist Static Sitting - Comment/# of Minutes: sat approx 3 minutes with facilitation for left side activation and cues for decreased right UE holding to rail.  Activated left side with weight bearing first Exercise  General Exercises - Lower Extremity Ankle Circles/Pumps: AROM;AAROM;Both;10 reps Short Arc QuadBarbaraann Boys;Right;10 reps;Supine Heel Slides: AAROM;Both;10 reps;Supine Hip ABduction/ADduction: AAROM;Right;10 reps;Supine End of Session PT - End of Session Equipment Utilized During Treatment: Gait belt Activity Tolerance: Patient limited by fatigue Patient left: in chair;with call bell in reach  Nurse Communication:  Need for lift equipment General Behavior During Session: [facility] for tasks performed Cognition: [facility] for tasks performed  [facility] 07/20/2011, 2:07 PM

## 2011-07-21 ENCOUNTER — Encounter (HOSPITAL_COMMUNITY): Payer: Self-pay

## 2011-07-21 ENCOUNTER — Inpatient Hospital Stay (HOSPITAL_COMMUNITY): Payer: Medicare HMO

## 2011-07-21 LAB — BASIC METABOLIC PANEL
CO2: 22 mEq/L (ref 19–32)
Chloride: 103 mEq/L (ref 96–112)
Glucose, Bld: 158 mg/dL — ABNORMAL HIGH (ref 70–99)
Potassium: 3.8 mEq/L (ref 3.5–5.1)
Sodium: 132 mEq/L — ABNORMAL LOW (ref 135–145)

## 2011-07-21 LAB — GLUCOSE, CAPILLARY
Glucose-Capillary: 167 mg/dL — ABNORMAL HIGH (ref 70–99)
Glucose-Capillary: 177 mg/dL — ABNORMAL HIGH (ref 70–99)
Glucose-Capillary: 177 mg/dL — ABNORMAL HIGH (ref 70–99)
Glucose-Capillary: 189 mg/dL — ABNORMAL HIGH (ref 70–99)

## 2011-07-21 LAB — PROTIME-INR
INR: 1.64 — ABNORMAL HIGH (ref 0.00–1.49)
Prothrombin Time: 19.7 seconds — ABNORMAL HIGH (ref 11.6–15.2)

## 2011-07-21 MED ORDER — HEPARIN (PORCINE) IN NACL 100-0.45 UNIT/ML-% IJ SOLN
1450.0000 [IU]/h | INTRAMUSCULAR | Status: DC
  Administered 2011-07-21: 1450 [IU]/h via INTRAVENOUS

## 2011-07-21 MED ORDER — INSULIN REGULAR HUMAN 100 UNIT/ML IJ SOLN
INTRAVENOUS | Status: AC
  Administered 2011-07-21: 18:00:00 via INTRAVENOUS
  Filled 2011-07-21: qty 2000

## 2011-07-21 MED ORDER — BOOST PLUS PO LIQD
237.0000 mL | Freq: Every day | ORAL | Status: DC
  Administered 2011-07-22 – 2011-08-04 (×10): 237 mL via ORAL
  Filled 2011-07-21 (×15): qty 237

## 2011-07-21 NOTE — Progress Notes (Signed)
Nutrition Follow-up  NG tube removed yesterday. Pt's diet was advanced yesterday; per RN pt tolerating PO well. Rectal tube output is decreasing, per internal med note. Per SLP, plan is to advance diet SLOWLY due to multiple asp PNA factors.  PO intake at breakfast <25%. Pt states he did not like the items sent. Discussed with pt his food preferences and will provide notes in [facility]. Pt with no c/o nausea/vomiting or abdominal pain. As PO intake increases will recommend to decrease rate of TPN.  Diet Order:  Full Liquid TPN: Clinimix E 5/20 @ 75 mll/hr. Lipids (20% IVFE @ 10 ml/hr), multivitamins and trace elements are provided MWF due to national backorder. Provides 1789 kcal and  90 grams protein  Insulin continued to be added to TPN.  Meds: Scheduled Meds:   . antiseptic oral rinse  15 mL Mouth Rinse q12n4p  . chlorhexidine  15 mL Mouth Rinse BID  . insulin aspart  0-15 Units Subcutaneous Q4H  . lip balm  1 application Topical BID  . metoprolol  10 mg Intravenous Q6H  . metronidazole  500 mg Intravenous Q6H  . pantoprazole (PROTONIX) IV  40 mg Intravenous Q24H  . sodium chloride  10-40 mL Intracatheter Q12H   Continuous Infusions:   . sodium chloride    . fat emulsion 250 mL (07/21/11 0700)  . heparin 1,250 Units/hr (07/21/11 0927)  . 0.9 % sodium chloride with kcl 20 mL/hr at 07/21/11 0924  . TPN (CLINIMIX) +/- additives 75 mL/hr at 07/19/11 1744  . TPN (CLINIMIX) +/- additives    . TPN (CLINIMIX) +/- additives 75 mL/hr at 07/21/11 0700  . DISCONTD: heparin 1,150 Units/hr (07/21/11 0654)   PRN Meds:.acetaminophen (TYLENOL) oral liquid 160 mg/5 mL, alum & mag hydroxide-simeth, fentaNYL, metoCLOPramide (REGLAN) injection, phenol, sodium chloride  Labs:  CMP     Component Value Date/Time   NA 132* 07/21/2011 0357   K 3.8 07/21/2011 0357   CL 103 07/21/2011 0357   CO2 22 07/21/2011 0357   GLUCOSE 158* 07/21/2011 0357   BUN 17 07/21/2011 0357   CREATININE 0.97  07/21/2011 0357   CALCIUM 7.7* 07/21/2011 0357   PROT 6.2 07/20/2011 0415   ALBUMIN 1.5* 07/20/2011 0415   AST 16 07/20/2011 0415   ALT 9 07/20/2011 0415   ALKPHOS 73 07/20/2011 0415   BILITOT 0.2* 07/20/2011 0415   GFRNONAA 79* 07/21/2011 0357   GFRAA >90 07/21/2011 0357  Phosphorus: 2.8 on 3/18 (WNL) Magnesium: 1.6 on 3/18 (WNL)  CBG (last 3)   Basename 07/21/11 0748 07/21/11 0344 07/21/11 0005  GLUCAP 177* 167* 189*     Intake/Output Summary (Last 24 hours) at 07/21/11 1128 Last data filed at 07/21/11 0700  Gross per 24 hour  Intake 3021.67 ml  Output   3600 ml  Net -578.33 ml    Weight Status:  70.5 kg (trending down from 73.6 kg on 3/15, however, wt still significantly increased from admission, 60.782 kg on 2/20)  Re-estimated needs:  1850-2100 kcal, 80-95 grams protein  Nutrition Dx:  Inadequate oral intake, ongoing.  Goal:   1. Parenteral nutrition; continued management per PharmD, continued tolerance with pt meeting 90-100% estimated needs. Met, continue. 2. Food/Beverage; tolerance if PO diet resume when ileus resolved. Not met but progressing, continue  3. Enteral nutrition; Trickle feeds if appropriate per MD with tolerance and stimulation of gut function. No longer applicable. 4. Blood glucose; goal for wound healing <180 mg/dL. Progressing.  Intervention:   1. Parenteral  nutrition; continued management per PharmD 2. Continue to advance diet as able. Discussed food preferences with pt. 3. Once PO intake is 25-50% of all meals, recommend begin to decrease TPN rate to allow for pt's appetite to increase. 3. Blood glucose; Pt getting 84 units/24 hrs via TPN, plus SSI q4hrs. PharmD to manage  Monitor: PO intake, diet advancement, labs, weight trend  Karenann Cai Pager #:  [phone]  Hoyt Koch

## 2011-07-21 NOTE — Progress Notes (Addendum)
ANTICOAGULATION CONSULT NOTE - Follow Up Consult  Pharmacy Consult for Heparin Indication: VTE, Afib, and r/o CVA  No Known Allergies  Patient Measurements: Height: 5\' 6"  (167.6 cm) Weight: 155 lb 6.8 oz (70.5 kg) IBW/kg (Calculated) : 63.8   Vital Signs: Temp: 97.9 F (36.6 C) (03/19 0000) Temp src: Oral (03/19 0000) BP: 154/84 mmHg (03/19 0600) Pulse Rate: 127  (03/19 0600)  Labs:  Basename 07/21/11 0357 07/20/11 0415 07/19/11 1730 07/19/11 0457  HGB -- 9.4* -- 8.7*  HCT -- 28.6* -- 26.4*  PLT -- 384 -- 370  APTT -- -- -- --  LABPROT 19.7* 20.3* -- 20.1*  INR 1.64* 1.70* -- 1.68*  HEPARINUNFRC 0.27* 0.31 0.34 --  CREATININE 0.97 0.98 -- 0.98  CKTOTAL -- -- -- --  CKMB -- -- -- --  TROPONINI -- -- -- --   Estimated Creatinine Clearance: 59.4 ml/min (by C-G formula based on Cr of 0.97).  Medications:  Scheduled:     . antiseptic oral rinse  15 mL Mouth Rinse q12n4p  . chlorhexidine  15 mL Mouth Rinse BID  . insulin aspart  0-15 Units Subcutaneous Q4H  . lip balm  1 application Topical BID  . metoprolol  10 mg Intravenous Q6H  . metronidazole  500 mg Intravenous Q6H  . pantoprazole (PROTONIX) IV  40 mg Intravenous Q24H  . 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  Heparin level slightly < goal  Of note, INR elevated: 1.64<1.7<1.68<1.54<1.5 No bleeding reported/documented.  Goal of Therapy:  Heparin level 0.3-0.5   Plan:   Increase heparin infusion at 1250 units/hr.  Recheck level in 8hr  Waiting to start Coumadin per neurology.  Loralee Pacas, PharmD, BCPS Pager: [phone] 07/21/2011,7:33 AM.   ADDENDUM: 4:54 PM 07/21/2011 A: Heparin level remains low at 0.16 despite rate increase.  P: Increase heparin rate again to 1450 units/hr.  Patient has not received boluses d/t  recent GIB.  Check heparin level in 8 hours.  [name], PharmD, BCPS Pager: [phone] 07/21/2011 5:00 PM

## 2011-07-21 NOTE — Progress Notes (Signed)
Patient ID: [name], male   DOB: 05-30-1933, 78 y.o.   MRN: [id number] [facility] Progress Note:   11 Days Post-Op  Subjective: Mental status is clear Objective: Vital signs in last 24 hours: Temp:  [97.5 F (36.4 C)-98.7 F (37.1 C)] 97.6 F (36.4 C) (03/19 0800) Pulse Rate:  [113-144] 126  (03/19 0800) Resp:  [18-29] 24  (03/19 0800) BP: (123-172)/(79-101) 123/79 mmHg (03/19 0700) SpO2:  [95 %-99 %] 96 % (03/19 0800)  Intake/Output from previous day: 03/18 0701 - 03/19 0700 In: 3587.7 [I.V.:1496; IV Piggyback:200; TPN:1891.7] Out: 4500 [Urine:4350; Stool:150] Intake/Output this shift:    Physical Exam: Work of breathing is  Better.  Not labored.  Incision has staples out and looks ok.  + BMs  Lab Results:  Results for orders placed during the hospital encounter of 06/24/11 (from the past 48 hour(s))  HEPARIN LEVEL (UNFRACTIONATED)     Status: Normal   Collection Time   07/19/11  9:35 AM      Component Value Range Comment   Heparin Unfractionated 0.41  0.30 - 0.70 (IU/mL)   GLUCOSE, CAPILLARY     Status: Abnormal   Collection Time   07/19/11 12:22 PM      Component Value Range Comment   Glucose-Capillary 199 (*) 70 - 99 (mg/dL)    Comment 1 Notify RN      Comment 2 Documented in Chart     GLUCOSE, CAPILLARY     Status: Abnormal   Collection Time   07/19/11  4:10 PM      Component Value Range Comment   Glucose-Capillary 190 (*) 70 - 99 (mg/dL)    Comment 1 Notify RN      Comment 2 Documented in Chart     HEPARIN LEVEL (UNFRACTIONATED)     Status: Normal   Collection Time   07/19/11  5:30 PM      Component Value Range Comment   Heparin Unfractionated 0.34  0.30 - 0.70 (IU/mL)   GLUCOSE, CAPILLARY     Status: Abnormal   Collection Time   07/19/11  7:42 PM      Component Value Range Comment   Glucose-Capillary 173 (*) 70 - 99 (mg/dL)    Comment 1 Documented in Chart      Comment 2 Notify RN     GLUCOSE, CAPILLARY     Status: Abnormal   Collection  Time   07/19/11 11:56 PM      Component Value Range Comment   Glucose-Capillary 152 (*) 70 - 99 (mg/dL)    Comment 1 Documented in Chart      Comment 2 Notify RN     PROTIME-INR     Status: Abnormal   Collection Time   07/20/11  4:15 AM      Component Value Range Comment   Prothrombin Time 20.3 (*) 11.6 - 15.2 (seconds)    INR 1.70 (*) 0.00 - 1.49    COMPREHENSIVE METABOLIC PANEL     Status: Abnormal   Collection Time   07/20/11  4:15 AM      Component Value Range Comment   Sodium 132 (*) 135 - 145 (mEq/L)    Potassium 3.6  3.5 - 5.1 (mEq/L)    Chloride 103  96 - 112 (mEq/L)    CO2 23  19 - 32 (mEq/L)    Glucose, Bld 154 (*) 70 - 99 (mg/dL)    BUN 17  6 - 23 (mg/dL)    Creatinine, Ser 4.09  0.50 - 1.35 (mg/dL)    Calcium 7.8 (*) 8.4 - 10.5 (mg/dL)    Total Protein 6.2  6.0 - 8.3 (g/dL)    Albumin 1.5 (*) 3.5 - 5.2 (g/dL)    AST 16  0 - 37 (U/L)    ALT 9  0 - 53 (U/L)    Alkaline Phosphatase 73  39 - 117 (U/L)    Total Bilirubin 0.2 (*) 0.3 - 1.2 (mg/dL)    GFR calc non Af Amer 78 (*) >90 (mL/min)    GFR calc Af Amer >90  >90 (mL/min)   MAGNESIUM     Status: Normal   Collection Time   07/20/11  4:15 AM      Component Value Range Comment   Magnesium 1.6  1.5 - 2.5 (mg/dL)   PHOSPHORUS     Status: Normal   Collection Time   07/20/11  4:15 AM      Component Value Range Comment   Phosphorus 2.8  2.3 - 4.6 (mg/dL)   DIFFERENTIAL     Status: Normal   Collection Time   07/20/11  4:15 AM      Component Value Range Comment   Neutrophils Relative 71  43 - 77 (%)    Lymphocytes Relative 20  12 - 46 (%)    Monocytes Relative 6  3 - 12 (%)    Eosinophils Relative 2  0 - 5 (%)    Basophils Relative 1  0 - 1 (%)    Neutro Abs 5.8  1.7 - 7.7 (K/uL)    Lymphs Abs 1.7  0.7 - 4.0 (K/uL)    Monocytes Absolute 0.5  0.1 - 1.0 (K/uL)    Eosinophils Absolute 0.2  0.0 - 0.7 (K/uL)    Basophils Absolute 0.1  0.0 - 0.1 (K/uL)    WBC Morphology MILD LEFT SHIFT (1-5% METAS, OCC MYELO, OCC BANDS)      CHOLESTEROL, TOTAL     Status: Normal   Collection Time   07/20/11  4:15 AM      Component Value Range Comment   Cholesterol 52  0 - 200 (mg/dL)   TRIGLYCERIDES     Status: Normal   Collection Time   07/20/11  4:15 AM      Component Value Range Comment   Triglycerides 47  <150 (mg/dL)   PREALBUMIN     Status: Abnormal   Collection Time   07/20/11  4:15 AM      Component Value Range Comment   Prealbumin 13.6 (*) 17.0 - 34.0 (mg/dL)   HEPARIN LEVEL (UNFRACTIONATED)     Status: Normal   Collection Time   07/20/11  4:15 AM      Component Value Range Comment   Heparin Unfractionated 0.31  0.30 - 0.70 (IU/mL)   CBC     Status: Abnormal   Collection Time   07/20/11  4:15 AM      Component Value Range Comment   WBC 8.3  4.0 - 10.5 (K/uL)    RBC 3.33 (*) 4.22 - 5.81 (MIL/uL)    Hemoglobin 9.4 (*) 13.0 - 17.0 (g/dL)    HCT 16.1 (*) 09.6 - 52.0 (%)    MCV 85.9  78.0 - 100.0 (fL)    MCH 28.2  26.0 - 34.0 (pg)    MCHC 32.9  30.0 - 36.0 (g/dL)    RDW 04.5 (*) 40.9 - 15.5 (%)    Platelets 384  150 - 400 (K/uL)   GLUCOSE, CAPILLARY  Status: Abnormal   Collection Time   07/20/11  4:40 AM      Component Value Range Comment   Glucose-Capillary 157 (*) 70 - 99 (mg/dL)    Comment 1 Documented in Chart      Comment 2 Notify RN     GLUCOSE, CAPILLARY     Status: Abnormal   Collection Time   07/20/11  7:34 AM      Component Value Range Comment   Glucose-Capillary 166 (*) 70 - 99 (mg/dL)    Comment 1 Documented in Chart      Comment 2 Notify RN     GLUCOSE, CAPILLARY     Status: Abnormal   Collection Time   07/20/11 11:37 AM      Component Value Range Comment   Glucose-Capillary 159 (*) 70 - 99 (mg/dL)   GLUCOSE, CAPILLARY     Status: Abnormal   Collection Time   07/20/11  3:48 PM      Component Value Range Comment   Glucose-Capillary 154 (*) 70 - 99 (mg/dL)    Comment 1 Notify RN      Comment 2 Documented in Chart     GLUCOSE, CAPILLARY     Status: Abnormal   Collection Time    07/20/11  7:50 PM      Component Value Range Comment   Glucose-Capillary 163 (*) 70 - 99 (mg/dL)    Comment 1 Notify RN     GLUCOSE, CAPILLARY     Status: Abnormal   Collection Time   07/21/11 12:05 AM      Component Value Range Comment   Glucose-Capillary 189 (*) 70 - 99 (mg/dL)    Comment 1 Notify RN     GLUCOSE, CAPILLARY     Status: Abnormal   Collection Time   07/21/11  3:44 AM      Component Value Range Comment   Glucose-Capillary 167 (*) 70 - 99 (mg/dL)    Comment 1 Notify RN     PROTIME-INR     Status: Abnormal   Collection Time   07/21/11  3:57 AM      Component Value Range Comment   Prothrombin Time 19.7 (*) 11.6 - 15.2 (seconds)    INR 1.64 (*) 0.00 - 1.49    HEPARIN LEVEL (UNFRACTIONATED)     Status: Abnormal   Collection Time   07/21/11  3:57 AM      Component Value Range Comment   Heparin Unfractionated 0.27 (*) 0.30 - 0.70 (IU/mL)   BASIC METABOLIC PANEL     Status: Abnormal   Collection Time   07/21/11  3:57 AM      Component Value Range Comment   Sodium 132 (*) 135 - 145 (mEq/L)    Potassium 3.8  3.5 - 5.1 (mEq/L)    Chloride 103  96 - 112 (mEq/L)    CO2 22  19 - 32 (mEq/L)    Glucose, Bld 158 (*) 70 - 99 (mg/dL)    BUN 17  6 - 23 (mg/dL)    Creatinine, Ser 1.61  0.50 - 1.35 (mg/dL)    Calcium 7.7 (*) 8.4 - 10.5 (mg/dL)    GFR calc non Af Amer 79 (*) >90 (mL/min)    GFR calc Af Amer >90  >90 (mL/min)     Radiology/Results: Dg Abd 1 View  07/19/2011  *RADIOLOGY REPORT*  Clinical Data: Abdominal pain and distention  ABDOMEN - 1 VIEW  Comparison: Plain film 07/17/2011  Findings: NG tube is unchanged  with tip in the stomach antrum. There is gas within the colon which is nondilated.  No dilated loops of small bowel.  There is gas in the rectum.  Multiple surgical clips.  IMPRESSION: No evidence of bowel obstruction.  No change.  Original Report Authenticated By: Genevive Bi, M.D.    Anti-infectives: Anti-infectives     Start     Dose/Rate Route Frequency  Ordered Stop   07/15/11 0900   vancomycin (VANCOCIN) IVPB 1000 mg/200 mL premix  Status:  Discontinued        1,000 mg 200 mL/hr over 60 Minutes Intravenous Every 12 hours 07/15/11 0407 07/16/11 1325   07/12/11 1800   metroNIDAZOLE (FLAGYL) IVPB 500 mg        500 mg 100 mL/hr over 60 Minutes Intravenous 4 times per day 07/12/11 1645     07/12/11 1530   vancomycin (VANCOCIN) 500 mg in sodium chloride irrigation 0.9 % 100 mL enema        500 mg 66.7 mL/hr over 90 Minutes Rectal 4 times per day 07/12/11 1450 07/13/11 0108   07/11/11 2359   vancomycin (VANCOCIN) IVPB 1000 mg/200 mL premix  Status:  Discontinued        1,000 mg 200 mL/hr over 60 Minutes Intravenous Every 24 hours 07/11/11 0807 07/15/11 0407   07/10/11 1800   vancomycin (VANCOCIN) 500 mg in sodium chloride 0.9 % 100 mL IVPB  Status:  Discontinued        500 mg 100 mL/hr over 60 Minutes Intravenous Every 12 hours 07/10/11 1641 07/11/11 0806   07/10/11 1800   piperacillin-tazobactam (ZOSYN) IVPB 3.375 g  Status:  Discontinued        3.375 g 12.5 mL/hr over 240 Minutes Intravenous Every 8 hours 07/10/11 1708 07/15/11 0720   07/10/11 0800   ertapenem (INVANZ) 1 g in sodium chloride 0.9 % 50 mL IVPB  Status:  Discontinued        1 g 100 mL/hr over 30 Minutes Intravenous Every 24 hours 07/10/11 0739 07/10/11 1659   06/26/11 2000   ceFAZolin (ANCEF) IVPB 1 g/50 mL premix        1 g 100 mL/hr over 30 Minutes Intravenous Every 6 hours 06/26/11 1658 06/27/11 0857          Assessment/Plan: Problem List: Patient Active Problem List  Diagnoses  . PE (pulmonary embolism)  . Chronic anticoagulation  . Fall from standing  . Fracture of hip, right, closed  . Peripheral arterial disease  . SBO (small bowel obstruction)  . Fecal impaction in rectum  . Chronic constipation  . Acute respiratory failure with hypoxia  . Tachycardia  . A-fib    Doing better.  Slow going 11 Days Post-Op    LOS: 27 days   Matt B. Daphine Deutscher,  MD, [facility] [phone] beeper [phone]  07/21/2011 9:02 AM

## 2011-07-21 NOTE — Progress Notes (Signed)
PARENTERAL NUTRITION CONSULT NOTE - FOLLOW UP  Pharmacy Consult for TNA Indication: SBO  No Known Allergies  Patient Measurements: Height: 5\' 6"  (167.6 cm) Weight: 155 lb 6.8 oz (70.5 kg) IBW/kg (Calculated) : 63.8  Usual Weight: unable to assess  Vital Signs: Temp: 97.6 F (36.4 C) (03/19 0800) Temp src: Oral (03/19 0800) BP: 123/79 mmHg (03/19 0700) Pulse Rate: 126  (03/19 0800)  Intake/Output from previous day: 03/18 0701 - 03/19 0700 In: 3587.7 [I.V.:1496; IV Piggyback:200; TPN:1891.7] Out: 4500 [Urine:4350; Stool:150]  Labs:  [facility] 07/21/11 0357 07/20/11 0415 07/19/11 0457  WBC -- 8.3 9.1  HGB -- 9.4* 8.7*  HCT -- 28.6* 26.4*  PLT -- 384 370  APTT -- -- --  INR 1.64* 1.70* 1.68*     Basename 07/21/11 0357 07/20/11 0415 07/19/11 0457  NA 132* 132* 133*  K 3.8 3.6 3.4*  CL 103 103 103  CO2 22 23 23   GLUCOSE 158* 154* 189*  BUN 17 17 16   CREATININE 0.97 0.98 0.98  LABCREA -- -- --  CREAT24HRUR -- -- --  CALCIUM 7.7* 7.8* 7.6*  MG -- 1.6 --  PHOS -- 2.8 --  PROT -- 6.2 --  ALBUMIN -- 1.5* --  AST -- 16 --  ALT -- 9 --  ALKPHOS -- 73 --  BILITOT -- 0.2* --  BILIDIR -- -- --  IBILI -- -- --  PREALBUMIN -- 13.6* --  TRIG -- 47 --  CHOLHDL -- -- --  CHOL -- 52 --  Corrected calcium = 9.7 Estimated Creatinine Clearance: 59.4 ml/min (by C-G formula based on Cr of 0.97).   Medications:  Scheduled:     . antiseptic oral rinse  15 mL Mouth Rinse q12n4p  . chlorhexidine  15 mL Mouth Rinse BID  . insulin aspart  0-15 Units Subcutaneous Q4H  . lip balm  1 application Topical BID  . metoprolol  10 mg Intravenous Q6H  . metronidazole  500 mg Intravenous Q6H  . pantoprazole (PROTONIX) IV  40 mg Intravenous Q24H  . sodium chloride  10-40 mL Intracatheter Q12H   IV Fluid:  NS with 30K at 97ml/hr   Nutritional Goals:   1850-2110 kCal, 79-92 grams of protein per day  Clinimix E 5/20 79ml/hr + 20% Lipids 76ml/hr will provide 90gm protein, 1584 kcal  on non-lipid days, 2064 kcal on lipid days, average 1790 kcal/week  Current Nutrition:  Clinimix E5/20 at 75 ml/hr +/- IV lipids at 39ml/hr on MWF. NPO  Assessment:  37 YOM s/p ORIF 2/22, GI bleed 2/28 thought to be due to diverticular disease, CT 3/4 consistent with SBO. NG tube with significant output. Last meal recorded 2/28.  POD#11 LOA, serosal/enterotomy repair, fecal disimpaction.  Tolerating TNA at goal rate of 75 ml/hr  Lytes: K slightly improved after replacement 3/17. Na+ remains low (unable to adjust in TNA). Corrected calcium wnl (9.7).  Prealbumin: baseline = 5.4 (3/11), 13.6 (3/18) trending up, albumin remains unchanged  Lipids: have remained normal  CBGs: Range 154-189 last 24 hours (18 units SSI required) after significant increase insulin in TNA 3/17.  Increase insulin slightly today.  GI Prophylaxis: IV Protonix, Reglan IV q8h prn  Diet: advanced to full liquids 3/18  Plan:   Continue Clinimix E5/20 at goal rate of 75 ml/hr  Fat emulsion at 10 ml/hr, standard multivitamins and trace elements on MWF only due to ongoing shortage. IVF per MD Increase insulin 84 units/2L bag - will receive ~75 units/day via TNA TNA lab  panels on Mondays & Thursdays.   Loralee Pacas, PharmD, BCPS Pager: [phone] 07/21/2011 9:38 AM

## 2011-07-21 NOTE — Progress Notes (Signed)
Speech Pathology: Dysphagia Treatment Note [id number]  Patient was observed with : Thin liquids and [place]    Patient was noted to have s/s of aspiration : No, but pt with increased work of breathing noted within first few swallows characterized by pursed lip breathing.  RN reports pt has been tachycardic today.  Pt complains of "heartburn" symptoms that improve after Maalox.    Pt asked why heartburn is worse with hospital admission, SLP advised him to speak to his physician about this issue.   SLP noted results of DG Abdomen today:  Colon gas present, dilataion of loop of small intestine in right side of abdomen.   SLP suspects GI issues *previous SBO, known reflux hx, colon gas present on DG Abd* and weakness may be contributing to reflux issue.  Pt reports cough worse today than previously but is nonproductive.    Pt also reports problems choking on saliva approximately 13 months ago with improvement when he started on a reflux medication.  At one time he reports being informed to "double up" on his PPI, but said he never did.   Lung Sounds:  CTA, productive cough Temperature: afebrile 60% intake documented  Patient required: No verbal cues needed, pt did need SLP to feed him [place] Ice due to arm weakness but was able to self feed water.  Pt did not desire more po after minimal amount of intake due to c/o heartburn.    Clinical Impression:  Pt appears to be tolerating liquids regarding oropharyngeal swallow, but does complain of heartburn, noted to have frequent belching.   Recommendations:  Continue clear liquids with close monitoring of tolerance.  Note pt diet changed from full liquid to clear due to GI issues.  Suspect intake will increase slowly due to deconditioning and GI issues.  SLP to follow briefly for tolerance and education.  Pt and spouse verbalized understanding to information.    Pain:   none Intervention Required:   No  Goals: Goals partially met  [name], MS [facility] SLP      [phone]    MD Would pt benefit from a referral for LTAC to aid with progression, especially considering he continues to require TNA?  Please order if you agree.  Thanks!

## 2011-07-21 NOTE — Progress Notes (Signed)
Subjective: No new problems overnight, patient tolerated some p.o. His rectal tube output appears to be decreasing. He denies increased abdominal pain. He does complain of some heartburn.  Objective: Vital signs in last 24 hours: Temp:  [97.5 F (36.4 C)-98.7 F (37.1 C)] 97.9 F (36.6 C) (03/19 0000) Pulse Rate:  [115-144] 127  (03/19 0600) Resp:  [18-29] 18  (03/19 0600) BP: (137-172)/(81-101) 154/84 mmHg (03/19 0600) SpO2:  [95 %-99 %] 95 % (03/19 0600) Weight change:  Last BM Date: 07/20/11 (flexiseal)  Intake/Output from previous day: 03/18 0701 - 03/19 0700 In: 3419.5 [I.V.:1384.5; IV Piggyback:200; TPN:1835] Out: 4500 [Urine:4350; Stool:150] Intake/Output this shift:    General appearance: alert and cooperative Resp: clear to auscultation bilaterally Cardio: tachycardic but regular GI: slight distended, decreased bowel sounds Extremities: extremities normal, atraumatic, no cyanosis or edema  Lab Results:  Results for orders placed during the hospital encounter of 06/24/11 (from the past 24 hour(s))  GLUCOSE, CAPILLARY     Status: Abnormal   Collection Time   07/20/11 11:37 AM      Component Value Range   Glucose-Capillary 159 (*) 70 - 99 (mg/dL)  GLUCOSE, CAPILLARY     Status: Abnormal   Collection Time   07/20/11  3:48 PM      Component Value Range   Glucose-Capillary 154 (*) 70 - 99 (mg/dL)   Comment 1 Notify RN     Comment 2 Documented in Chart    GLUCOSE, CAPILLARY     Status: Abnormal   Collection Time   07/20/11  7:50 PM      Component Value Range   Glucose-Capillary 163 (*) 70 - 99 (mg/dL)   Comment 1 Notify RN    GLUCOSE, CAPILLARY     Status: Abnormal   Collection Time   07/21/11 12:05 AM      Component Value Range   Glucose-Capillary 189 (*) 70 - 99 (mg/dL)   Comment 1 Notify RN    GLUCOSE, CAPILLARY     Status: Abnormal   Collection Time   07/21/11  3:44 AM      Component Value Range   Glucose-Capillary 167 (*) 70 - 99 (mg/dL)   Comment 1  Notify RN    PROTIME-INR     Status: Abnormal   Collection Time   07/21/11  3:57 AM      Component Value Range   Prothrombin Time 19.7 (*) 11.6 - 15.2 (seconds)   INR 1.64 (*) 0.00 - 1.49   HEPARIN LEVEL (UNFRACTIONATED)     Status: Abnormal   Collection Time   07/21/11  3:57 AM      Component Value Range   Heparin Unfractionated 0.27 (*) 0.30 - 0.70 (IU/mL)  BASIC METABOLIC PANEL     Status: Abnormal   Collection Time   07/21/11  3:57 AM      Component Value Range   Sodium 132 (*) 135 - 145 (mEq/L)   Potassium 3.8  3.5 - 5.1 (mEq/L)   Chloride 103  96 - 112 (mEq/L)   CO2 22  19 - 32 (mEq/L)   Glucose, Bld 158 (*) 70 - 99 (mg/dL)   BUN 17  6 - 23 (mg/dL)   Creatinine, Ser 3.08  0.50 - 1.35 (mg/dL)   Calcium 7.7 (*) 8.4 - 10.5 (mg/dL)   GFR calc non Af Amer 79 (*) >90 (mL/min)   GFR calc Af Amer >90  >90 (mL/min)      Studies/Results: Dg Abd 1 View  07/19/2011  *  RADIOLOGY REPORT*  Clinical Data: Abdominal pain and distention  ABDOMEN - 1 VIEW  Comparison: Plain film 07/17/2011  Findings: NG tube is unchanged with tip in the stomach antrum. There is gas within the colon which is nondilated.  No dilated loops of small bowel.  There is gas in the rectum.  Multiple surgical clips.  IMPRESSION: No evidence of bowel obstruction.  No change.  Original Report Authenticated By: Genevive Bi, M.D.    Medications:  Scheduled:    . antiseptic oral rinse  15 mL Mouth Rinse q12n4p  . chlorhexidine  15 mL Mouth Rinse BID  . insulin aspart  0-15 Units Subcutaneous Q4H  . lip balm  1 application Topical BID  . metoprolol  10 mg Intravenous Q6H  . metronidazole  500 mg Intravenous Q6H  . pantoprazole (PROTONIX) IV  40 mg Intravenous Q24H  . sodium chloride  10-40 mL Intracatheter Q12H   Continuous:   . sodium chloride    . fat emulsion 10 kcal (07/20/11 1900)  . heparin    . 0.9 % sodium chloride with kcl 50 mL/hr at 07/20/11 1900  . TPN (CLINIMIX) +/- additives 75 mL/hr at  07/19/11 1744  . TPN (CLINIMIX) +/- additives 75 mL/hr at 07/20/11 1730  . DISCONTD: heparin 1,150 Units/hr (07/21/11 0654)    Assessment/Plan:  CVA with left hemiparesis, continue heparin continue recommendations per neurology. CT angiogram the neck shows occluded right ICA, Coumadin when tolerant  of p.o. intake   Status post lysis of adhesions,  small bowel obstruction which is now resolved, He did tolerate some liquid diet yesterday  Hypokalemia, Now resolved,We will continue some potassium in IV fluids and potassium runs As needed   Acute on chronic kidney disease, Resolved   History of PE. Recent ultrasound shows chronic bilateral DVT   Status post GI bleed which required reversal of anticoagulation, EGD without definitive source, previous colonoscopy only revealed diverticulosis.   Anemia multifactorial , Hemoglobin stable no recurrent bleeding   Clostridium difficile, on Flagyl   Elevated CBGs, insulin has been added to TPN , If p.o. Diet tolerated, sliding-scale insulin and  will be added as needed .  Malnutrition continue TPN,Advance diet as tolerated  Decondition continue physical therapy    LOS: 27 days   Tommy Minichiello D 07/21/2011, 8:20 AM

## 2011-07-22 ENCOUNTER — Inpatient Hospital Stay (HOSPITAL_COMMUNITY): Payer: Medicare HMO

## 2011-07-22 LAB — CBC
HCT: 27.8 % — ABNORMAL LOW (ref 39.0–52.0)
Hemoglobin: 9 g/dL — ABNORMAL LOW (ref 13.0–17.0)
MCH: 29.2 pg (ref 26.0–34.0)
MCHC: 30.4 g/dL (ref 30.0–36.0)
Platelets: 343 10*3/uL (ref 150–400)
RDW: 16.8 % — ABNORMAL HIGH (ref 11.5–15.5)
WBC: 8.2 10*3/uL (ref 4.0–10.5)

## 2011-07-22 LAB — GLUCOSE, CAPILLARY
Glucose-Capillary: 187 mg/dL — ABNORMAL HIGH (ref 70–99)
Glucose-Capillary: 166 mg/dL — ABNORMAL HIGH (ref 70–99)
Glucose-Capillary: 155 mg/dL — ABNORMAL HIGH (ref 70–99)
Glucose-Capillary: 168 mg/dL — ABNORMAL HIGH (ref 70–99)
Glucose-Capillary: 163 mg/dL — ABNORMAL HIGH (ref 70–99)

## 2011-07-22 LAB — PROTIME-INR: INR: 1.63 — ABNORMAL HIGH (ref 0.00–1.49)

## 2011-07-22 LAB — BASIC METABOLIC PANEL
BUN: 18 mg/dL (ref 6–23)
Chloride: 104 mEq/L (ref 96–112)
GFR calc Af Amer: >90 mL/min (ref >90)
Potassium: 3.7 mEq/L (ref 3.5–5.1)
Sodium: 134 mEq/L — ABNORMAL LOW (ref 135–145)

## 2011-07-22 LAB — HEPARIN LEVEL (UNFRACTIONATED)
Heparin Unfractionated: 0.55 IU/mL (ref 0.30–0.70)
Heparin Unfractionated: 0.52 IU/mL (ref 0.30–0.70)

## 2011-07-22 MED ORDER — HEPARIN (PORCINE) IN NACL 100-0.45 UNIT/ML-% IJ SOLN
1000.0000 [IU]/h | INTRAMUSCULAR | Status: DC
  Administered 2011-07-22 – 2011-07-23 (×2): 1400 [IU]/h via INTRAVENOUS
  Administered 2011-07-23: 1100 [IU]/h via INTRAVENOUS
  Administered 2011-07-24 – 2011-07-25 (×2): 1050 [IU]/h via INTRAVENOUS
  Administered 2011-07-27 – 2011-07-29 (×3): 1000 [IU]/h via INTRAVENOUS
  Filled 2011-07-22 (×7): qty 250

## 2011-07-22 MED ORDER — FAT EMULSION 20 % IV EMUL
250.0000 mL | INTRAVENOUS | Status: AC
  Administered 2011-07-22: 250 mL via INTRAVENOUS
  Filled 2011-07-22: qty 250

## 2011-07-22 MED ORDER — TRACE MINERALS CR-CU-MN-SE-ZN 10-1000-500-60 MCG/ML IV SOLN
INTRAVENOUS | Status: AC
  Administered 2011-07-22: 19:00:00 via INTRAVENOUS
  Filled 2011-07-22: qty 2000

## 2011-07-22 MED ORDER — PANTOPRAZOLE SODIUM 40 MG PO TBEC
40.0000 mg | DELAYED_RELEASE_TABLET | Freq: Every day | ORAL | Status: DC
  Administered 2011-07-22 – 2011-08-04 (×14): 40 mg via ORAL
  Filled 2011-07-22 (×16): qty 1

## 2011-07-22 NOTE — Progress Notes (Signed)
ANTICOAGULATION CONSULT NOTE - Follow Up Consult  Pharmacy Consult for Heparin Indication: VTE, Afib, and r/o CVA  No Known Allergies  Patient Measurements: Height: 5\' 6"  (167.6 cm) Weight: 155 lb 6.8 oz (70.5 kg) IBW/kg (Calculated) : 63.8   Vital Signs: Temp: 98.6 F (37 C) (03/19 2300) Temp src: Axillary (03/19 2300) BP: 140/89 mmHg (03/20 0000) Pulse Rate: 120  (03/20 0000)  Labs:  Basename 07/22/11 0230 07/21/11 1610 07/21/11 0357 07/20/11 0415 07/19/11 0457  HGB 9.0* 8.4* -- -- --  HCT 27.8* 27.6* -- 28.6* --  PLT 367 343 -- 384 --  APTT -- -- -- -- --  LABPROT -- -- 19.7* 20.3* 20.1*  INR -- -- 1.64* 1.70* 1.68*  HEPARINUNFRC 0.55 0.16* 0.27* -- --  CREATININE -- -- 0.97 0.98 0.98  CKTOTAL -- -- -- -- --  CKMB -- -- -- -- --  TROPONINI -- -- -- -- --   Estimated Creatinine Clearance: 59.4 ml/min (by C-G formula based on Cr of 0.97).  Medications:  Scheduled:     . antiseptic oral rinse  15 mL Mouth Rinse q12n4p  . Boost Plus  237 mL Oral Q1400  . chlorhexidine  15 mL Mouth Rinse BID  . insulin aspart  0-15 Units Subcutaneous Q4H  . lip balm  1 application Topical BID  . metoprolol  10 mg Intravenous Q6H  . metronidazole  500 mg Intravenous Q6H  . pantoprazole (PROTONIX) IV  40 mg Intravenous Q24H  . 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  Heparin level slightly < goal  Of note, INR elevated: 1.64<1.7<1.68<1.54<1.5 No bleeding reported/documented or IV interuptions per RN. HL = 0.55 just slightly above desired range for pt with recent CVA.  Goal of Therapy:  Heparin level 0.3-0.5   Plan:   Decrease Heparin drip to 1400 units/hr.  Recheck HL in 8 hours.   Gean Birchwood, PharmD [phone]

## 2011-07-22 NOTE — Progress Notes (Signed)
ANTICOAGULATION CONSULT NOTE - Follow Up Consult  Pharmacy Consult for Heparin Indication: VTE, Afib, and r/o CVA  No Known Allergies   Labs:  Basename 07/22/11 1000 07/22/11 0230 07/21/11 1610 07/21/11 0357 07/20/11 0415  HGB -- 9.0* 8.4* -- --  HCT -- 27.8* 27.6* -- 28.6*  PLT -- 367 343 -- 384  APTT -- -- -- -- --  LABPROT -- 19.6* -- 19.7* 20.3*  INR -- 1.63* -- 1.64* 1.70*  HEPARINUNFRC 0.52 0.55 0.16* -- --  CREATININE -- 0.94 -- 0.97 0.98  CKTOTAL -- -- -- -- --  CKMB -- -- -- -- --  TROPONINI -- -- -- -- --   Estimated Creatinine Clearance: 61.3 ml/min (by C-G formula based on Cr of 0.94).  Medications:  Scheduled:     . antiseptic oral rinse  15 mL Mouth Rinse q12n4p  . Boost Plus  237 mL Oral Q1400  . chlorhexidine  15 mL Mouth Rinse BID  . insulin aspart  0-15 Units Subcutaneous Q4H  . lip balm  1 application Topical BID  . metoprolol  10 mg Intravenous Q6H  . metronidazole  500 mg Intravenous Q6H  . pantoprazole  40 mg Oral Daily  . sodium chloride  10-40 mL Intracatheter Q12H  . DISCONTD: pantoprazole (PROTONIX) IV  40 mg Intravenous Q24H   Assessment:  78 yo M, admit on chronic warfarin for history of VTE, s/p ORIF on 2/22, ex-lap with LOA, enterotomy 3/8, currently on heparin drip for PE, Afib, and CVA.  Heparin level slightly > goal, but trending down following rate decrease  Of note, INR elevated: 1.63<1.7<1.68<1.54<1.5  CBC stable No bleeding reported/documented    Goal of Therapy:  Heparin level 0.3-0.5   Plan:   Continue heparin 1400 units/hr  Daily heparin level, CBC  Awaiting abilityto resume coumadin - per Neurology note on, should wait 5-10 days after CVA before starting Coumadin due to risk of conversion of sizeable infarct   Loralee Pacas, PharmD, BCPS Pager: [phone] 07/22/2011, 11:43 AM

## 2011-07-22 NOTE — Progress Notes (Signed)
PARENTERAL NUTRITION CONSULT NOTE - FOLLOW UP  Pharmacy Consult for TNA Indication: SBO  No Known Allergies  Patient Measurements: Height: 5\' 6"  (167.6 cm) Weight: 155 lb 6.8 oz (70.5 kg) IBW/kg (Calculated) : 63.8  Weight on admit: 162 lb (73kg)  Vital Signs: Temp: 98 F (36.7 C) (03/20 0800) Temp src: Oral (03/20 0800) BP: 144/84 mmHg (03/20 1000) Pulse Rate: 124  (03/20 1000)  Intake/Output from previous day: 03/19 0701 - 03/20 0700 In: 2841.1 [I.V.:826.1; IV Piggyback:200; TPN:1815] Out: 3295 [Urine:3295]  Labs:  [facility] 07/22/11 0230 07/21/11 1610 07/21/11 0357 07/20/11 0415  WBC 8.2 7.3 -- 8.3  HGB 9.0* 8.4* -- 9.4*  HCT 27.8* 27.6* -- 28.6*  PLT 367 343 -- 384  APTT -- -- -- --  INR 1.63* -- 1.64* 1.70*     Basename 07/22/11 0230 07/21/11 0357 07/20/11 0415  NA 134* 132* 132*  K 3.7 3.8 3.6  CL 104 103 103  CO2 23 22 23   GLUCOSE 161* 158* 154*  BUN 18 17 17   CREATININE 0.94 0.97 0.98  LABCREA -- -- --  CREAT24HRUR -- -- --  CALCIUM 7.7* 7.7* 7.8*  MG -- -- 1.6  PHOS -- -- 2.8  PROT -- -- 6.2  ALBUMIN -- -- 1.5*  AST -- -- 16  ALT -- -- 9  ALKPHOS -- -- 73  BILITOT -- -- 0.2*  BILIDIR -- -- --  IBILI -- -- --  PREALBUMIN -- -- 13.6*  TRIG -- -- 47  CHOLHDL -- -- --  CHOL -- -- 52  Corrected calcium = 9.7 Estimated Creatinine Clearance: 61.3 ml/min (by C-G formula based on Cr of 0.94).   Medications:  Scheduled:     . antiseptic oral rinse  15 mL Mouth Rinse q12n4p  . Boost Plus  237 mL Oral Q1400  . chlorhexidine  15 mL Mouth Rinse BID  . insulin aspart  0-15 Units Subcutaneous Q4H  . lip balm  1 application Topical BID  . metoprolol  10 mg Intravenous Q6H  . metronidazole  500 mg Intravenous Q6H  . pantoprazole  40 mg Oral Daily  . sodium chloride  10-40 mL Intracatheter Q12H  . DISCONTD: pantoprazole (PROTONIX) IV  40 mg Intravenous Q24H   IV Fluid:  NS with 30K at 80ml/hr   Nutritional Goals:   1850-2110 kCal, 79-92 grams  of protein per day  Clinimix E 5/20 45ml/hr + 20% Lipids 16ml/hr will provide 90gm protein, 1584 kcal on non-lipid days, 2064 kcal on lipid days, average 1790 kcal/week  Current Nutrition:  Clinimix E5/20 at 75 ml/hr +/- IV lipids at 17ml/hr on MWF. Clear liquids (did not tolerate full liquids 3/19)  CBGs: 3/19 = 177, 187, 188 After increased insulin in TNA = 177, 161, 166, 155 18 units SSI required over past 24hr  Assessment:  77 YOM s/p ORIF 2/22, GI bleed 2/28 thought to be due to diverticular disease, CT 3/4 consistent with SBO.   POD#12 LOA, serosal/enterotomy repair, fecal disimpaction.  Tolerating TNA at goal rate of 75 ml/hr  Lytes: K slightly improved after replacement 3/17. Na+ remains low (unable to adjust in TNA). Corrected calcium wnl (9.7).  Prealbumin: baseline = 5.4 (3/11), 13.6 (3/18) trending up, albumin remains unchanged  Lipids: have remained normal  CBGs: improving with significant increase insulin in TNA 3/17 and 3/19.  Currently has 84 units insulin/2L bag (provides ~75 units/24hr).  Approaching goal of <150  GI Prophylaxis: PO Protonix, Reglan IV q8h prn  Diet:  advanced to full liquids 3/18, decreased to clears 3/19 due to intolerance (heartburn)  Plan:   Continue Clinimix E5/20 at goal rate of 75 ml/hr  Fat emulsion at 10 ml/hr, standard multivitamins and trace elements on MWF only due to ongoing shortage. Continue current insulin regimen for now since CBGs trending down nicely now. TNA lab panels on Mondays & Thursdays.   Loralee Pacas, PharmD, BCPS Pager: [phone] 07/22/2011 11:24 AM

## 2011-07-22 NOTE — Progress Notes (Addendum)
Physical Therapy Treatment Patient Details Name: [name] MRN: [id number] DOB: 03-05-1934 Today's Date: 07/22/2011  PT Assessment/Plan  PT - Assessment/Plan Comments on Treatment Session: Pt continues to demonstrate left UE/LE active movements, however continues to require max trunk assist due to lateral and posterior leaning/pushing.  Continue to provide cues for upright head posture with cervical stretching .  Pt BP 144/71, HR 123-140-128.  WBing LEs/any attempts to perform partial stand not done due to elevated HR.   PT Plan: Discharge plan remains appropriate PT Frequency: Min 3X/week Recommendations for Other Services: Rehab consult Follow Up Recommendations: CIR Equipment Recommended: Defer to next venue PT Goals  Acute Rehab PT Goals PT Goal Formulation: With patient Time For Goal Achievement: 2 weeks Pt will Roll Supine to Left Side: with mod assist PT Goal: Rolling Supine to Left Side - Progress: Progressing toward goal Pt will go Supine/Side to Sit: with mod assist PT Goal: Supine/Side to Sit - Progress: Progressing toward goal Pt will Sit at Edge of Bed: 6-10 min;with min assist;with unilateral upper extremity support;with cues (comment type and amount) PT Goal: Sit at Edge Of Bed - Progress: Progressing toward goal Pt will Ambulate: 16 - 50 feet;with mod assist;with rolling walker PT Goal: Ambulate - Progress: Discontinued (comment) Pt will Perform Home Exercise Program: with min assist PT Goal: Perform Home Exercise Program - Progress: Progressing toward goal  PT Treatment Precautions/Restrictions  Precautions Precautions: Fall Precaution Comments: S/P ORIF right hip--no precautions Required Braces or Orthoses: No Restrictions Weight Bearing Restrictions: Yes RLE Weight Bearing: Weight bearing as tolerated Other Position/Activity Restrictions: Pt aware he is WBAT Mobility (including Balance) Bed Mobility Rolling Left: 1: +2 Total assist Rolling Left Details  (indicate cue type and reason): Pt assist 20%.  Required assist with trunk initiation and LE to attain sidelying position.  Cues provided for hand placement on bed rail and technique with LE.   (Performed x 3 reps for core strengthening. ) Left Sidelying to Sit: 1: +2 Total assist Left Sidelying to Sit Details (indicate cue type and reason): Pt assist 15%.  Provided assist for B LE off of bed, assist for trunk and UE.  Cues provided for UE placement to self assist trunk into sitting.  Sitting - Scoot to Edge of Bed: 1: +2 Total assist Sitting - Scoot to Edge of Bed Details (indicate cue type and reason): Pt assist 10%.  Used bed pad to assist pts hip forward with cues for hand placement.   Transfers Transfers: Yes Transfer via Lift Equipment: [place] Ambulation/Gait Ambulation/Gait: No  Posture/Postural Control Posture/Postural Control: Postural limitations Postural Limitations: head forward flexed and rotated left, right leg positioned out and to right in sitting with left side elongated with left lateral and posterior pelvice tilt Balance Balance Assessed: Yes Static Sitting Balance Static Sitting - Balance Support: Feet supported;Right upper extremity supported;No upper extremity supported Static Sitting - Level of Assistance: 1: +2 Total assist;Patient percentage (comment) Static Sitting - Comment/# of Minutes: Pt assist 20%.  Requires increased assist for left lateral leaning/posterior leaning/pushing.  Cues provided for upright trunk and head positioning.  Tolerated approx 30 mins of sitting balance/postural activities.  Dynamic Sitting Balance Dynamic Sitting - Balance Support: Right upper extremity supported;Left upper extremity supported;Feet supported (weight shifted on R UE, then L UE) Dynamic Sitting - Level of Assistance: 1: +2 Total assist;Patient percentage (comment) Dynamic Sitting Balance - Compensations: Pt assist 25%.  Cues for weight shifting/weight bearing through elbows  with assist for maintaining position.  Constant cuing for head positioning.   Dynamic Sitting - Balance Activities: Lateral lean/weight shifting Dynamic Sitting - Comments: Noted pt assisting more with weight shifting, however continues to require +2 for trunk control and RLE foot flat.  Exercise  Total Joint Exercises Hip ABduction/ADduction: AROM;AAROM;Right;Left;10 reps General Exercises - Upper Extremity Shoulder Flexion: PROM;AROM;Both;10 reps;Supine Elbow Flexion: AAROM;PROM;Left;10 reps;Supine;Strengthening (resisted) Elbow Extension: AAROM;PROM;Left;10 reps;Supine;Strengthening General Exercises - Lower Extremity Ankle Circles/Pumps: AROM;AAROM;Both;10 reps Short Arc QuadBarbaraann Boys;Right;10 reps;Supine Other Exercises Other Exercises: cervical rotation and flexion to left Other Exercises: Hip ext against manual resistance x 10 reps supine End of Session PT - End of Session Activity Tolerance: Patient limited by fatigue Patient left: in chair;with call bell in reach Nurse Communication: Need for lift equipment General Behavior During Session: [facility] for tasks performed Cognition: [facility] for tasks performed  [facility] 07/22/2011, 12:05 PM

## 2011-07-22 NOTE — Progress Notes (Signed)
Subjective: Patient did not eat or drink much yesterday, he complains of some heartburn. He wonders if he can have Nexium. There is no nausea no vomiting, he denies abdominal pain. Per nursing loose stool has decreased. He spent some time the day yesterday  Objective: Vital signs in last 24 hours: Temp:  [97.4 F (36.3 C)-98.6 F (37 C)] 98 F (36.7 C) (03/20 0800) Pulse Rate:  [107-139] 116  (03/20 0800) Resp:  [17-27] 17  (03/20 0800) BP: (123-160)/(73-95) 143/95 mmHg (03/20 0800) SpO2:  [96 %-99 %] 98 % (03/20 0800) Weight change:  Last BM Date: 07/20/11 (flexiseal)  Intake/Output from previous day: 03/19 0701 - 03/20 0700 In: 2841.1 [I.V.:826.1; IV Piggyback:200; TPN:1815] Out: 3295 [Urine:3295] Intake/Output this shift:    General appearance: alert and cooperative Resp: clear to auscultation bilaterally Cardio: tachycardic but regular Extremities: extremities normal, atraumatic, no cyanosis or edema Neurologic: Motor: left hemiparesis  Lab Results:  Results for orders placed during the hospital encounter of 06/24/11 (from the past 24 hour(s))  GLUCOSE, CAPILLARY     Status: Abnormal   Collection Time   07/21/11 11:36 AM      Component Value Range   Glucose-Capillary 187 (*) 70 - 99 (mg/dL)   Comment 1 Documented in Chart     Comment 2 Notify RN    GLUCOSE, CAPILLARY     Status: Abnormal   Collection Time   07/21/11  3:38 PM      Component Value Range   Glucose-Capillary 188 (*) 70 - 99 (mg/dL)   Comment 1 Documented in Chart     Comment 2 Notify RN    HEPARIN LEVEL (UNFRACTIONATED)     Status: Abnormal   Collection Time   07/21/11  4:10 PM      Component Value Range   Heparin Unfractionated 0.16 (*) 0.30 - 0.70 (IU/mL)  CBC     Status: Abnormal   Collection Time   07/21/11  4:10 PM      Component Value Range   WBC 7.3  4.0 - 10.5 (K/uL)   RBC 2.88 (*) 4.22 - 5.81 (MIL/uL)   Hemoglobin 8.4 (*) 13.0 - 17.0 (g/dL)   HCT 16.1 (*) 09.6 - 52.0 (%)   MCV 95.8   78.0 - 100.0 (fL)   MCH 29.2  26.0 - 34.0 (pg)   MCHC 30.4  30.0 - 36.0 (g/dL)   RDW 04.5 (*) 40.9 - 15.5 (%)   Platelets 343  150 - 400 (K/uL)  GLUCOSE, CAPILLARY     Status: Abnormal   Collection Time   07/21/11  7:14 PM      Component Value Range   Glucose-Capillary 177 (*) 70 - 99 (mg/dL)   Comment 1 Documented in Chart     Comment 2 Notify RN    GLUCOSE, CAPILLARY     Status: Abnormal   Collection Time   07/21/11 11:07 PM      Component Value Range   Glucose-Capillary 161 (*) 70 - 99 (mg/dL)   Comment 1 Documented in Chart     Comment 2 Notify RN    PROTIME-INR     Status: Abnormal   Collection Time   07/22/11  2:30 AM      Component Value Range   Prothrombin Time 19.6 (*) 11.6 - 15.2 (seconds)   INR 1.63 (*) 0.00 - 1.49   CBC     Status: Abnormal   Collection Time   07/22/11  2:30 AM      Component  Value Range   WBC 8.2  4.0 - 10.5 (K/uL)   RBC 3.22 (*) 4.22 - 5.81 (MIL/uL)   Hemoglobin 9.0 (*) 13.0 - 17.0 (g/dL)   HCT 66.4 (*) 40.3 - 52.0 (%)   MCV 86.3  78.0 - 100.0 (fL)   MCH 28.0  26.0 - 34.0 (pg)   MCHC 32.4  30.0 - 36.0 (g/dL)   RDW 47.4 (*) 25.9 - 15.5 (%)   Platelets 367  150 - 400 (K/uL)  BASIC METABOLIC PANEL     Status: Abnormal   Collection Time   07/22/11  2:30 AM      Component Value Range   Sodium 134 (*) 135 - 145 (mEq/L)   Potassium 3.7  3.5 - 5.1 (mEq/L)   Chloride 104  96 - 112 (mEq/L)   CO2 23  19 - 32 (mEq/L)   Glucose, Bld 161 (*) 70 - 99 (mg/dL)   BUN 18  6 - 23 (mg/dL)   Creatinine, Ser 5.63  0.50 - 1.35 (mg/dL)   Calcium 7.7 (*) 8.4 - 10.5 (mg/dL)   GFR calc non Af Amer 80 (*) >90 (mL/min)   GFR calc Af Amer >90  >90 (mL/min)  HEPARIN LEVEL (UNFRACTIONATED)     Status: Normal   Collection Time   07/22/11  2:30 AM      Component Value Range   Heparin Unfractionated 0.55  0.30 - 0.70 (IU/mL)  GLUCOSE, CAPILLARY     Status: Abnormal   Collection Time   07/22/11  3:46 AM      Component Value Range   Glucose-Capillary 166 (*) 70 - 99  (mg/dL)  GLUCOSE, CAPILLARY     Status: Abnormal   Collection Time   07/22/11  7:40 AM      Component Value Range   Glucose-Capillary 155 (*) 70 - 99 (mg/dL)   Comment 1 Notify RN     Comment 2 Documented in Chart        Studies/Results: Dg Abd Portable 1v  07/22/2011  *RADIOLOGY REPORT*  Clinical Data: 78 year old male with abdominal pain.  PORTABLE ABDOMEN - 1 VIEW  Comparison: 07/21/2011 and earlier.  Findings: Portable supine views of the abdomen and pelvis at 0840 hours.  Scoliosis.  Postoperative changes to the proximal right femur.  Sequelae of ventral abdominal hernia repair. Nonobstructed bowel gas pattern.  Probable left pleural effusion.  IMPRESSION: 1. Nonobstructed bowel gas pattern. 2.  Left pleural effusion.  Original Report Authenticated By: Harley Hallmark, M.D.   Dg Abd Portable 1v  07/21/2011  *RADIOLOGY REPORT*  Clinical Data: History of small-bowel obstruction.  Follow-up.  PORTABLE ABDOMEN - 1 VIEW  Comparison: 07/19/2011.  CT 07/06/2011.  Findings: The enteric tube seen on the prior study is no longer evident.  Colon gas is present.  There is dilatation of a loop of small intestine in the right side of the abdomen.  There is osteopenic appearance of the bones.  Scoliosis convexity to the right is seen.  There is degenerative spondylosis with osteophyte formation at multiple levels.  Previous anterior abdominal wall surgery is seen been performed.  Previous ORIF of right proximal femur has been performed with hardware in place without evidence of disruption.  Surgical clips are seen in the right inguinal region.  IMPRESSION: There has been interval removal of the enteric tube.  There is dilatation of a loop of small intestine in the right side of the abdomen.  Colon gas is present.  Original Report Authenticated By: DAVID  CALL, M.D.    Medications:  Scheduled:   . antiseptic oral rinse  15 mL Mouth Rinse q12n4p  . Boost Plus  237 mL Oral Q1400  . chlorhexidine  15 mL  Mouth Rinse BID  . insulin aspart  0-15 Units Subcutaneous Q4H  . lip balm  1 application Topical BID  . metoprolol  10 mg Intravenous Q6H  . metronidazole  500 mg Intravenous Q6H  . pantoprazole (PROTONIX) IV  40 mg Intravenous Q24H  . sodium chloride  10-40 mL Intracatheter Q12H   Continuous:   . sodium chloride    . fat emulsion 250 mL (07/21/11 1600)  . heparin 1,400 Units/hr (07/22/11 0345)  . 0.9 % sodium chloride with kcl 20 mL/hr at 07/21/11 1800  . TPN (CLINIMIX) +/- additives 75 mL/hr at 07/21/11 1800  . TPN (CLINIMIX) +/- additives 75 mL/hr at 07/21/11 1200  . DISCONTD: heparin 1,450 Units/hr (07/21/11 1900)    Assessment/Plan: CVA with left hemiparesis, continue heparin continue recommendations per neurology. CT angiogram the neck shows occluded right ICA, Coumadin when tolerant of p.o. intake  Status post lysis of adhesions, small bowel obstruction which is now resolved, He did tolerate some liquid diet yesterday . Followup x-ray today nonobstructive bowel gas pattern Hypokalemia, Now resolved,We will continue some potassium in IV fluids and potassium runs As needed  Acute on chronic kidney disease, Resolved  History of PE. Recent ultrasound shows chronic bilateral DVT  Status post GI bleed which required reversal of anticoagulation, EGD without definitive source, previous colonoscopy only revealed diverticulosis.  Anemia multifactorial , Hemoglobin stable no recurrent bleeding  Clostridium difficile, on Flagyl DC Flagyl after 14 days Elevated CBGs, insulin has been added to TPN , If p.o. Diet tolerated, sliding-scale insulin and will be added as needed  .  Malnutrition continue TPN,Advance diet as tolerated  Decondition continue physical therapy    LOS: 28 days   [name] D 07/22/2011, 9:40 AM

## 2011-07-22 NOTE — Progress Notes (Signed)
Patient ID: [name], male   DOB: 08/20/1933, 78 y.o.   MRN: [id number] [name] 78 y.o.  Body mass index is 25.09 kg/(m^2).  Patient Active Problem List  Diagnoses  . PE (pulmonary embolism)  . Chronic anticoagulation  . Fall from standing  . Fracture of hip, right, closed  . Peripheral arterial disease  . SBO (small bowel obstruction)  . Fecal impaction in rectum  . Chronic constipation  . Acute respiratory failure with hypoxia  . Tachycardia  . A-fib    No Known Allergies  Past Surgical History  Procedure Date  . Hernia repair   . Appendectomy   . Femoral-femoral bypass graft   . Iliac artery stent   . Esophagogastroduodenoscopy 07/03/2011    Procedure: ESOPHAGOGASTRODUODENOSCOPY (EGD);  Surgeon: Shirley Friar, MD;  Location: [facility];  Service: Endoscopy;  Laterality: N/A;  . Compression hip screw 06/26/2011    Procedure: COMPRESSION HIP;  Surgeon: Raymon Mutton, MD;  Location: [facility];  Service: Orthopedics;  Laterality: Right;   Katy Apo, MD, MD 1. Intertrochanteric fracture of right hip   2. Acute respiratory failure with hypoxia   3. PE (pulmonary embolism)     Today [name] said he felt much better. He seems to be making progress. A posterior row. He remains in the ICU although hopefully we will be able to get into the floor soon.  Matt B. Daphine Deutscher, MD, [facility] [phone] beeper [phone]  07/22/2011 5:59 PM

## 2011-07-23 ENCOUNTER — Inpatient Hospital Stay (HOSPITAL_COMMUNITY): Payer: Medicare HMO

## 2011-07-23 LAB — COMPREHENSIVE METABOLIC PANEL
AST: 17 U/L (ref 0–37)
Albumin: 1.7 g/dL — ABNORMAL LOW (ref 3.5–5.2)
BUN: 17 mg/dL (ref 6–23)
Calcium: 8 mg/dL — ABNORMAL LOW (ref 8.4–10.5)
Chloride: 104 mEq/L (ref 96–112)
Creatinine, Ser: 0.88 mg/dL (ref 0.50–1.35)
Total Bilirubin: 0.2 mg/dL — ABNORMAL LOW (ref 0.3–1.2)
Total Protein: 6.5 g/dL (ref 6.0–8.3)

## 2011-07-23 LAB — MAGNESIUM: Magnesium: 1.8 mg/dL (ref 1.5–2.5)

## 2011-07-23 LAB — CBC
HCT: 27.2 % — ABNORMAL LOW (ref 39.0–52.0)
Hemoglobin: 8.9 g/dL — ABNORMAL LOW (ref 13.0–17.0)
MCHC: 32.7 g/dL (ref 30.0–36.0)
RBC: 3.13 MIL/uL — ABNORMAL LOW (ref 4.22–5.81)
WBC: 7.3 10*3/uL (ref 4.0–10.5)

## 2011-07-23 LAB — GLUCOSE, CAPILLARY
Glucose-Capillary: 157 mg/dL — ABNORMAL HIGH (ref 70–99)
Glucose-Capillary: 161 mg/dL — ABNORMAL HIGH (ref 70–99)
Glucose-Capillary: 163 mg/dL — ABNORMAL HIGH (ref 70–99)
Glucose-Capillary: 173 mg/dL — ABNORMAL HIGH (ref 70–99)
Glucose-Capillary: 199 mg/dL — ABNORMAL HIGH (ref 70–99)

## 2011-07-23 LAB — PHOSPHORUS: Phosphorus: 2.9 mg/dL (ref 2.3–4.6)

## 2011-07-23 LAB — HEPARIN LEVEL (UNFRACTIONATED): Heparin Unfractionated: 0.67 IU/mL (ref 0.30–0.70)

## 2011-07-23 LAB — PROTIME-INR
INR: 1.67 — ABNORMAL HIGH (ref 0.00–1.49)
Prothrombin Time: 20 seconds — ABNORMAL HIGH (ref 11.6–15.2)

## 2011-07-23 MED ORDER — METOPROLOL TARTRATE 25 MG PO TABS
25.0000 mg | ORAL_TABLET | Freq: Two times a day (BID) | ORAL | Status: DC
  Administered 2011-07-23 – 2011-07-25 (×6): 25 mg via ORAL
  Filled 2011-07-23 (×8): qty 1

## 2011-07-23 MED ORDER — WARFARIN SODIUM 3 MG PO TABS
3.0000 mg | ORAL_TABLET | Freq: Once | ORAL | Status: AC
  Administered 2011-07-23: 3 mg via ORAL
  Filled 2011-07-23: qty 1

## 2011-07-23 MED ORDER — METOPROLOL TARTRATE 1 MG/ML IV SOLN: 10.0000 mg | INTRAVENOUS | Status: DC | PRN

## 2011-07-23 MED ORDER — ENSURE CLINICAL ST REVIGOR PO LIQD: 237.0000 mL | Freq: Once | ORAL | Status: DC

## 2011-07-23 MED ORDER — METOPROLOL TARTRATE 1 MG/ML IV SOLN
5.0000 mg | Freq: Four times a day (QID) | INTRAVENOUS | Status: DC | PRN
  Administered 2011-07-24: 5 mg via INTRAVENOUS
  Filled 2011-07-23: qty 5

## 2011-07-23 MED ORDER — INSULIN REGULAR HUMAN 100 UNIT/ML IJ SOLN
INTRAVENOUS | Status: AC
  Administered 2011-07-23: 19:00:00 via INTRAVENOUS
  Filled 2011-07-23: qty 2000

## 2011-07-23 MED ORDER — INSULIN ASPART 100 UNIT/ML ~~LOC~~ SOLN
0.0000 [IU] | Freq: Three times a day (TID) | SUBCUTANEOUS | Status: DC
  Administered 2011-07-23 (×2): 3 [IU] via SUBCUTANEOUS
  Administered 2011-07-23: 5 [IU] via SUBCUTANEOUS
  Administered 2011-07-24 (×3): 3 [IU] via SUBCUTANEOUS
  Administered 2011-07-24: 5 [IU] via SUBCUTANEOUS
  Administered 2011-07-25: 3 [IU] via SUBCUTANEOUS
  Administered 2011-07-25 (×3): 2 [IU] via SUBCUTANEOUS
  Administered 2011-07-26 (×2): 3 [IU] via SUBCUTANEOUS
  Administered 2011-07-26: 2 [IU] via SUBCUTANEOUS
  Administered 2011-07-26 – 2011-07-27 (×2): 3 [IU] via SUBCUTANEOUS
  Administered 2011-07-27: 2 [IU] via SUBCUTANEOUS
  Administered 2011-07-27: 3 [IU] via SUBCUTANEOUS
  Administered 2011-07-28: 2 [IU] via SUBCUTANEOUS
  Administered 2011-07-28 (×2): 3 [IU] via SUBCUTANEOUS
  Administered 2011-07-29: 2 [IU] via SUBCUTANEOUS
  Administered 2011-07-29 (×2): 3 [IU] via SUBCUTANEOUS
  Administered 2011-07-29 – 2011-08-01 (×5): 2 [IU] via SUBCUTANEOUS

## 2011-07-23 MED ORDER — WARFARIN - PHARMACIST DOSING INPATIENT: Freq: Every day | Status: DC

## 2011-07-23 NOTE — Progress Notes (Addendum)
PARENTERAL NUTRITION CONSULT NOTE - FOLLOW UP  Pharmacy Consult for TNA Indication: SBO  No Known Allergies  Patient Measurements: Height: 5\' 6"  (167.6 cm) Weight: 147 lb 14.9 oz (67.1 kg) IBW/kg (Calculated) : 63.8  Weight on admit: 162 lb (73kg)  Vital Signs: Temp: 98.1 F (36.7 C) (03/21 0400) Temp src: Oral (03/21 0400) BP: 145/73 mmHg (03/21 0400) Pulse Rate: 115  (03/21 0400)  Intake/Output from previous day: 03/20 0701 - 03/21 0700 In: 3141 [I.V.:896; IV Piggyback:400; TPN:1845] Out: 2800 [Urine:2800]  Labs:  [facility] 07/23/11 0400 07/22/11 0230 07/21/11 1610 07/21/11 0357  WBC 7.3 8.2 7.3 --  HGB 8.9* 9.0* 8.4* --  HCT 27.2* 27.8* 27.6* --  PLT 393 367 343 --  APTT -- -- -- --  INR 1.67* 1.63* -- 1.64*     Basename 07/23/11 0400 07/22/11 0230 07/21/11 0357  NA 132* 134* 132*  K 3.8 3.7 3.8  CL 104 104 103  CO2 23 23 22   GLUCOSE 195* 161* 158*  BUN 17 18 17   CREATININE 0.88 0.94 0.97  LABCREA -- -- --  CREAT24HRUR -- -- --  CALCIUM 8.0* 7.7* 7.7*  MG 1.8 -- --  PHOS 2.9 -- --  PROT 6.5 -- --  ALBUMIN 1.7* -- --  AST 17 -- --  ALT 9 -- --  ALKPHOS 90 -- --  BILITOT 0.2* -- --  BILIDIR -- -- --  IBILI -- -- --  PREALBUMIN -- -- --  TRIG -- -- --  CHOLHDL -- -- --  CHOL -- -- --  Corrected calcium = 9.7 Estimated Creatinine Clearance: 65.5 ml/min (by C-G formula based on Cr of 0.88).   Medications:  Scheduled:     . antiseptic oral rinse  15 mL Mouth Rinse q12n4p  . Boost Plus  237 mL Oral Q1400  . chlorhexidine  15 mL Mouth Rinse BID  . insulin aspart  0-15 Units Subcutaneous Q4H  . lip balm  1 application Topical BID  . metoprolol  10 mg Intravenous Q6H  . metronidazole  500 mg Intravenous Q6H  . pantoprazole  40 mg Oral Daily  . sodium chloride  10-40 mL Intracatheter Q12H  . DISCONTD: pantoprazole (PROTONIX) IV  40 mg Intravenous Q24H   IV Fluid:  NS with 30K at 10ml/hr   Nutritional Goals:   1850-2110 kCal, 79-92 grams of  protein per day  Clinimix E 5/20 70ml/hr + 20% Lipids 9ml/hr will provide 90gm protein, 1584 kcal on non-lipid days, 2064 kcal on lipid days, average 1790 kcal/week  Current Nutrition:  Clinimix E5/20 at 75 ml/hr +/- IV lipids at 30ml/hr on MWF. Clear liquids (did not tolerate full liquids 3/19)  CBGs: 157, 163, 199 on current formula -> requiring 9 units SSI over last 12hrs.  Assessment:  59 YOM s/p ORIF 2/22, GI bleed 2/28 thought to be due to diverticular disease, CT 3/4 consistent with SBO.   POD#13 LOA, serosal/enterotomy repair, fecal disimpaction.  Tolerating TNA at goal rate of 75 ml/hr  Lytes: K stable. Na+ remains low(unable to adjust in TNA). Corrected calcium wnl (9.8).  Prealbumin: 13.6(3/18), Results pending for 3/21.  Lipids: have remained normal. Results pending for 3/21.  CBGs: improving with significant increase insulin in TNA 3/17 and 3/19.  Currently has 84 units insulin/2L bag (provides ~75 units/24hr).  Approaching goal of <150.   GI Prophylaxis: PO Protonix, Reglan IV q8h prn  Diet: Changed back to [facility] diet today. Feels like he can eat. Prefers strawberry flavored  supplements.   Plan:   Continue Clinimix E5/20 at goal rate of 75 ml/hr  Fat emulsion at 10 ml/hr, standard multivitamins and trace elements on MWF only due to ongoing shortage. Continue current insulin regimen for now since CBGs coming under control. Change CBGs/SSI to preprandial and bedtime.  TNA lab panels on Mondays & Thursdays. Let us know when we can start tapering TNA to help with appetite.  Charolotte Eke, PharmD, pager [phone]. 07/23/2011,8:05 AM.

## 2011-07-23 NOTE — Progress Notes (Signed)
Nutrition Follow-up  Diet Order:  Full liquids  Pt diet readvanced to full liquids, was unable to tolerate initial advancement.  Pt states he does not drink milk, does not eat pudding, but would like to try creamed soups and ice cream.  Pt has a chocolate Boost at bedside with minimal amount consumed.  Pt requests a lid to help him sip without spilling which is provided.  Pt seems weak.  Seems to have an appetite; asks for a cracker with his soup.  Discussed diet advancement process and hopeful continued tolerance so that he could start solid foods soon and d/c IV nutrition therapy.  Pt verbalizes understanding.  Continues Clinimix 5/20 @ 75 mL/hr with lipids @ 10 mL MWF providing 1789 kcal, 90g protein on average.  Meds: Scheduled Meds:   . antiseptic oral rinse  15 mL Mouth Rinse q12n4p  . Boost Plus  237 mL Oral Q1400  . chlorhexidine  15 mL Mouth Rinse BID  . insulin aspart  0-15 Units Subcutaneous TID AC & HS  . lip balm  1 application Topical BID  . metoprolol tartrate  25 mg Oral BID  . pantoprazole  40 mg Oral Daily  . sodium chloride  10-40 mL Intracatheter Q12H  . warfarin  3 mg Oral Once  . Warfarin - Pharmacist Dosing Inpatient   Does not apply q1800  . DISCONTD: feeding supplement  237 mL Oral Once  . DISCONTD: insulin aspart  0-15 Units Subcutaneous Q4H  . DISCONTD: metoprolol  10 mg Intravenous Q6H  . DISCONTD: metronidazole  500 mg Intravenous Q6H   Continuous Infusions:   . sodium chloride    . fat emulsion 250 mL (07/22/11 1905)  . heparin 1,300 Units/hr (07/23/11 0755)  . 0.9 % sodium chloride with kcl 20 mL/hr at 07/21/11 1800  . TPN (CLINIMIX) +/- additives 75 mL/hr at 07/21/11 1800  . TPN (CLINIMIX) +/- additives    . TPN (CLINIMIX) +/- additives 75 mL/hr at 07/22/11 1858   PRN Meds:.acetaminophen (TYLENOL) oral liquid 160 mg/5 mL, alum & mag hydroxide-simeth, metoCLOPramide (REGLAN) injection, metoprolol, phenol, sodium chloride  Labs:  CMP     Component  Value Date/Time   NA 132* 07/23/2011 0400   K 3.8 07/23/2011 0400   CL 104 07/23/2011 0400   CO2 23 07/23/2011 0400   GLUCOSE 195* 07/23/2011 0400   BUN 17 07/23/2011 0400   CREATININE 0.88 07/23/2011 0400   CALCIUM 8.0* 07/23/2011 0400   PROT 6.5 07/23/2011 0400   ALBUMIN 1.7* 07/23/2011 0400   AST 17 07/23/2011 0400   ALT 9 07/23/2011 0400   ALKPHOS 90 07/23/2011 0400   BILITOT 0.2* 07/23/2011 0400   GFRNONAA 82* 07/23/2011 0400   GFRAA >90 07/23/2011 0400   CBG (last 3)   Basename 07/23/11 0806 07/23/11 0408 07/22/11 2359  GLUCAP 168* 199* 163*  Blood glucose in better control.    Intake/Output Summary (Last 24 hours) at 07/23/11 1025 Last data filed at 07/23/11 0952  Gross per 24 hour  Intake 3080.92 ml  Output   3150 ml  Net -69.08 ml    Weight Status:  Variable.  Range: 134-162 lbs Current wt:  147 lbs  Nutrition Dx:   Inadequate oral intake, ongoing  Intervention:   1.  Supplements; per PharmD, pt requested a Strawberry supplement but does not like Boost/Ensure.  Provided pt with a [employer] to try. Currently working on a Boost, so will try later.  Will order BID in anticipation  of pt liking better than Boost. 2.  Meals/snacks; obtained lunch order per pt's preferences.  Monitor:  PO intake, diet advancement, labs, wt trend   Hoyt Koch Pager #:  [phone]

## 2011-07-23 NOTE — Progress Notes (Signed)
Pt having loose BM's every 20 to 30 minutes, MD notified.  No flexiseal at this time.

## 2011-07-23 NOTE — Progress Notes (Signed)
Subjective: Patient had a good night, no nausea no vomiting, he tolerated more food. He had a bowel movement. No problems per nursing  Objective: Vital signs in last 24 hours: Temp:  [97.2 F (36.2 C)-98.1 F (36.7 C)] 98.1 F (36.7 C) (03/21 0400) Pulse Rate:  [102-124] 115  (03/21 0400) Resp:  [14-23] 14  (03/21 0400) BP: (130-151)/(69-89) 145/73 mmHg (03/21 0400) SpO2:  [97 %-98 %] 97 % (03/21 0400) Weight:  [67.1 kg (147 lb 14.9 oz)] 67.1 kg (147 lb 14.9 oz) (03/21 0400) Weight change:  Last BM Date: 07/22/11  Intake/Output from previous day: 03/20 0701 - 03/21 0700 In: 3141 [I.V.:896; IV Piggyback:400; TPN:1845] Out: 2800 [Urine:2800] Intake/Output this shift: Total I/O In: -  Out: 350 [Urine:350]  General appearance: alert and cooperative Resp: clear to auscultation bilaterally Cardio: tachycardia but regular Extremities: extremities normal, atraumatic, no cyanosis or edema Neurologic: Motor: left hemiparesis, exam unchanged  Lab Results:  Results for orders placed during the hospital encounter of 06/24/11 (from the past 24 hour(s))  HEPARIN LEVEL (UNFRACTIONATED)     Status: Normal   Collection Time   07/22/11 10:00 AM      Component Value Range   Heparin Unfractionated 0.52  0.30 - 0.70 (IU/mL)  GLUCOSE, CAPILLARY     Status: Abnormal   Collection Time   07/22/11 11:32 AM      Component Value Range   Glucose-Capillary 168 (*) 70 - 99 (mg/dL)  GLUCOSE, CAPILLARY     Status: Abnormal   Collection Time   07/22/11  4:15 PM      Component Value Range   Glucose-Capillary 163 (*) 70 - 99 (mg/dL)   Comment 1 Notify RN     Comment 2 Documented in Chart    GLUCOSE, CAPILLARY     Status: Abnormal   Collection Time   07/22/11  7:46 PM      Component Value Range   Glucose-Capillary 157 (*) 70 - 99 (mg/dL)  GLUCOSE, CAPILLARY     Status: Abnormal   Collection Time   07/22/11 11:59 PM      Component Value Range   Glucose-Capillary 163 (*) 70 - 99 (mg/dL)   Comment 1  Documented in Chart     Comment 2 Notify RN    PROTIME-INR     Status: Abnormal   Collection Time   07/23/11  4:00 AM      Component Value Range   Prothrombin Time 20.0 (*) 11.6 - 15.2 (seconds)   INR 1.67 (*) 0.00 - 1.49   COMPREHENSIVE METABOLIC PANEL     Status: Abnormal   Collection Time   07/23/11  4:00 AM      Component Value Range   Sodium 132 (*) 135 - 145 (mEq/L)   Potassium 3.8  3.5 - 5.1 (mEq/L)   Chloride 104  96 - 112 (mEq/L)   CO2 23  19 - 32 (mEq/L)   Glucose, Bld 195 (*) 70 - 99 (mg/dL)   BUN 17  6 - 23 (mg/dL)   Creatinine, Ser 4.69  0.50 - 1.35 (mg/dL)   Calcium 8.0 (*) 8.4 - 10.5 (mg/dL)   Total Protein 6.5  6.0 - 8.3 (g/dL)   Albumin 1.7 (*) 3.5 - 5.2 (g/dL)   AST 17  0 - 37 (U/L)   ALT 9  0 - 53 (U/L)   Alkaline Phosphatase 90  39 - 117 (U/L)   Total Bilirubin 0.2 (*) 0.3 - 1.2 (mg/dL)   GFR calc non  Af Amer 82 (*) >90 (mL/min)   GFR calc Af Amer >90  >90 (mL/min)  MAGNESIUM     Status: Normal   Collection Time   07/23/11  4:00 AM      Component Value Range   Magnesium 1.8  1.5 - 2.5 (mg/dL)  PHOSPHORUS     Status: Normal   Collection Time   07/23/11  4:00 AM      Component Value Range   Phosphorus 2.9  2.3 - 4.6 (mg/dL)  CBC     Status: Abnormal   Collection Time   07/23/11  4:00 AM      Component Value Range   WBC 7.3  4.0 - 10.5 (K/uL)   RBC 3.13 (*) 4.22 - 5.81 (MIL/uL)   Hemoglobin 8.9 (*) 13.0 - 17.0 (g/dL)   HCT 13.0 (*) 86.5 - 52.0 (%)   MCV 86.9  78.0 - 100.0 (fL)   MCH 28.4  26.0 - 34.0 (pg)   MCHC 32.7  30.0 - 36.0 (g/dL)   RDW 78.4 (*) 69.6 - 15.5 (%)   Platelets 393  150 - 400 (K/uL)  HEPARIN LEVEL (UNFRACTIONATED)     Status: Normal   Collection Time   07/23/11  4:00 AM      Component Value Range   Heparin Unfractionated 0.67  0.30 - 0.70 (IU/mL)  GLUCOSE, CAPILLARY     Status: Abnormal   Collection Time   07/23/11  4:08 AM      Component Value Range   Glucose-Capillary 199 (*) 70 - 99 (mg/dL)  GLUCOSE, CAPILLARY     Status:  Abnormal   Collection Time   07/23/11  8:06 AM      Component Value Range   Glucose-Capillary 168 (*) 70 - 99 (mg/dL)      Studies/Results: Dg Abd Portable 1v  07/22/2011  *RADIOLOGY REPORT*  Clinical Data: 77 year old male with abdominal pain.  PORTABLE ABDOMEN - 1 VIEW  Comparison: 07/21/2011 and earlier.  Findings: Portable supine views of the abdomen and pelvis at 0840 hours.  Scoliosis.  Postoperative changes to the proximal right femur.  Sequelae of ventral abdominal hernia repair. Nonobstructed bowel gas pattern.  Probable left pleural effusion.  IMPRESSION: 1. Nonobstructed bowel gas pattern. 2.  Left pleural effusion.  Original Report Authenticated By: Harley Hallmark, M.D.    Medications:  Scheduled:   . antiseptic oral rinse  15 mL Mouth Rinse q12n4p  . Boost Plus  237 mL Oral Q1400  . chlorhexidine  15 mL Mouth Rinse BID  . insulin aspart  0-15 Units Subcutaneous TID AC & HS  . lip balm  1 application Topical BID  . metoprolol  10 mg Intravenous Q6H  . metronidazole  500 mg Intravenous Q6H  . pantoprazole  40 mg Oral Daily  . sodium chloride  10-40 mL Intracatheter Q12H  . DISCONTD: feeding supplement  237 mL Oral Once  . DISCONTD: insulin aspart  0-15 Units Subcutaneous Q4H  . DISCONTD: pantoprazole (PROTONIX) IV  40 mg Intravenous Q24H   Continuous:   . sodium chloride    . fat emulsion 250 mL (07/22/11 1905)  . heparin 1,300 Units/hr (07/23/11 0755)  . 0.9 % sodium chloride with kcl 20 mL/hr at 07/21/11 1800  . TPN (CLINIMIX) +/- additives 75 mL/hr at 07/21/11 1800  . TPN (CLINIMIX) +/- additives    . TPN (CLINIMIX) +/- additives 75 mL/hr at 07/22/11 1858    Assessment/Plan: CVA with left hemiparesis, continue heparin continue recommendations per  neurology. CT angiogram the neck shows occluded right ICA, Coumadin when tolerant of p.o. intake   Status post lysis of adhesions, small bowel obstruction which is now resolved, He did tolerate some liquid diet  yesterday . Followup x-ray today nonobstructive bowel gas pattern   Hypokalemia, Now resolved,We will continue some potassium in IV fluids and potassium runs As needed   Acute on chronic kidney disease, Resolved   History of PE. Recent ultrasound shows chronic bilateral DVT   Status post GI bleed which required reversal of anticoagulation, EGD without definitive source, previous colonoscopy only revealed diverticulosis.   Anemia multifactorial , Hemoglobin stable no recurrent bleeding   Clostridium difficile, on Flagyl DC Flagyl after 14 days   Elevated CBGs, insulin has been added to TPN , If p.o. Diet tolerated, sliding-scale insulin and will be added as needed  .  Malnutrition continue TPN,Advance diet as tolerated   Decondition continue physical therapy . Physical therapy or recommendations noted in reference to inpatient rehabilitation    LOS: 29 days   Sydnee Lamour D 07/23/2011, 9:23 AM

## 2011-07-23 NOTE — Progress Notes (Signed)
ANTICOAGULATION CONSULT NOTE - Follow Up Consult  Pharmacy Consult for Heparin/Coumadin Indication: VTE, Afib, and r/o CVA  No Known Allergies   Labs:  Basename 07/23/11 1330 07/23/11 0400 07/22/11 1000 07/22/11 0230 07/21/11 1610 07/21/11 0357  HGB -- 8.9* -- 9.0* -- --  HCT -- 27.2* -- 27.8* 27.6* --  PLT -- 393 -- 367 343 --  APTT -- -- -- -- -- --  LABPROT -- 20.0* -- 19.6* -- 19.7*  INR -- 1.67* -- 1.63* -- 1.64*  HEPARINUNFRC 0.68 0.67 0.52 -- -- --  CREATININE -- 0.88 -- 0.94 -- 0.97  CKTOTAL -- -- -- -- -- --  CKMB -- -- -- -- -- --  TROPONINI -- -- -- -- -- --   Estimated Creatinine Clearance: 65.5 ml/min (by C-G formula based on Cr of 0.88).  Medications:  Scheduled:     . antiseptic oral rinse  15 mL Mouth Rinse q12n4p  . Boost Plus  237 mL Oral Q1400  . chlorhexidine  15 mL Mouth Rinse BID  . insulin aspart  0-15 Units Subcutaneous TID AC & HS  . lip balm  1 application Topical BID  . metoprolol tartrate  25 mg Oral BID  . pantoprazole  40 mg Oral Daily  . sodium chloride  10-40 mL Intracatheter Q12H  . warfarin  3 mg Oral Once  . Warfarin - Pharmacist Dosing Inpatient   Does not apply q1800  . DISCONTD: feeding supplement  237 mL Oral Once  . DISCONTD: insulin aspart  0-15 Units Subcutaneous Q4H  . DISCONTD: metoprolol  10 mg Intravenous Q6H  . DISCONTD: metronidazole  500 mg Intravenous Q6H   Assessment:  78 yo M, admit on chronic warfarin for history of VTE, s/p ORIF on 2/22, ex-lap with LOA, enterotomy 3/8, currently on heparin drip for PE, Afib, and CVA.  Heparin level still above goal.  No bleeding reported/documented   Goal of Therapy:  Heparin level 0.3-0.5   Plan:   Reduce heparin further to 1100 units/hr.  Will wait to recheck heparin level in am d/t multiple blood draws and INR close to therapeutic.  Hopefully INR will be therapeutic soon so heparin can be stopped.  Charolotte Eke, PharmD, pager [phone]. 07/23/2011,2:21 PM.

## 2011-07-23 NOTE — Progress Notes (Addendum)
ANTICOAGULATION CONSULT NOTE - Follow Up Consult  Pharmacy Consult for Heparin Indication: VTE, Afib, and r/o CVA  No Known Allergies   Labs:  Basename 07/23/11 0400 07/22/11 1000 07/22/11 0230 07/21/11 1610 07/21/11 0357  HGB 8.9* -- 9.0* -- --  HCT 27.2* -- 27.8* 27.6* --  PLT 393 -- 367 343 --  APTT -- -- -- -- --  LABPROT 20.0* -- 19.6* -- 19.7*  INR 1.67* -- 1.63* -- 1.64*  HEPARINUNFRC 0.67 0.52 0.55 -- --  CREATININE 0.88 -- 0.94 -- 0.97  CKTOTAL -- -- -- -- --  CKMB -- -- -- -- --  TROPONINI -- -- -- -- --   Estimated Creatinine Clearance: 65.5 ml/min (by C-G formula based on Cr of 0.88).  Medications:  Scheduled:     . antiseptic oral rinse  15 mL Mouth Rinse q12n4p  . Boost Plus  237 mL Oral Q1400  . chlorhexidine  15 mL Mouth Rinse BID  . insulin aspart  0-15 Units Subcutaneous Q4H  . lip balm  1 application Topical BID  . metoprolol  10 mg Intravenous Q6H  . metronidazole  500 mg Intravenous Q6H  . pantoprazole  40 mg Oral Daily  . sodium chloride  10-40 mL Intracatheter Q12H  . DISCONTD: pantoprazole (PROTONIX) IV  40 mg Intravenous Q24H   Assessment:  78 yo M, admit on chronic warfarin for history of VTE, s/p ORIF on 2/22, ex-lap with LOA, enterotomy 3/8, currently on heparin drip for PE, Afib, and CVA.  Heparin level slightly > goal, but trending down following rate decrease  INR remains elevated off Coumadin: 1.67<1.63<1.7<1.68<1.54<1.5  CBC stable No bleeding reported/documented   Goal of Therapy:  Heparin level 0.3-0.5   Plan:   Reduce heparin to 1300 units/hr.  Recheck heparin level in 6hrs.  Daily heparin level, CBC  Awaiting abilityto resume coumadin - per Neurology note on, should wait 5-10 days after CVA before starting Coumadin due to risk of conversion of sizeable infarct.  Charolotte Eke, PharmD, pager [phone]. 07/23/2011,7:27 AM.  Resuming Coumadin. INR goal 2-3. INR has stayed elevated without any Coumadin. Not sure how  sensitive he will be. Home dose was about 4mg /day. Will give 3mg  today and follow INR daily.  Charolotte Eke, PharmD, pager [phone]. 07/23/2011,10:18 AM.

## 2011-07-23 NOTE — Progress Notes (Signed)
Georgena Spurling, MD   [name], PA-C [address] [place], [place], [place]  [address]                             [phone]   PROGRESS NOTE  Subjective:  negative for Chest Pain  negative for Shortness of Breath  negative for Nausea/Vomiting   negative for Calf Pain  negative for Bowel Movement   Tolerating Diet: yes         Patient reports pain as 3 on 0-10 scale.    Objective: Vital signs in last 24 hours:   Patient Vitals for the past 24 hrs:  BP Temp Temp src Pulse Resp SpO2 Weight  07/23/11 1200 133/76 mmHg - - 132  21  100 % -  07/23/11 1000 138/78 mmHg - - 122  21  100 % -  07/23/11 0800 132/78 mmHg 97.6 F (36.4 C) - 115  23  99 % -  07/23/11 0400 145/73 mmHg 98.1 F (36.7 C) Oral 115  14  97 % 67.1 kg (147 lb 14.9 oz)  07/23/11 0000 143/84 mmHg 97.7 F (36.5 C) Oral 102  23  97 % -  07/22/11 2000 151/88 mmHg 97.6 F (36.4 C) Oral 124  23  98 % -  07/22/11 1938 - 97.6 F (36.4 C) Oral - - - -  07/22/11 1900 - - - 115  20  97 % -  07/22/11 1800 135/75 mmHg - - 103  16  98 % -  07/22/11 1600 130/69 mmHg 97.8 F (36.6 C) Axillary 122  21  97 % -  07/22/11 1400 138/76 mmHg - - 119  21  98 % -  07/22/11 1330 - - - 111  - 98 % -  07/22/11 1240 - - - 109  - 98 % -    @flow {1959:LAST@   Intake/Output from previous day:   03/20 0701 - 03/21 0700 In: 3141 [I.V.:896] Out: 2800 [Urine:2800]   Intake/Output this shift:   03/21 0701 - 03/21 1900 In: 574.9 [I.V.:149.9] Out: 700 [Urine:700]   Intake/Output      03/20 0701 - 03/21 0700 03/21 0701 - 03/22 0700   I.V. (mL/kg) 896 (13.4) 149.9 (2.2)   IV Piggyback 400    TPN 1845 425   Total Intake(mL/kg) 3141 (46.8) 574.9 (8.6)   Urine (mL/kg/hr) 2800 (1.7) 700 (2)   Total Output 2800 700   Net +341 -125.1        Stool Occurrence 1 x 1 x      LABORATORY DATA:  Basename 07/23/11 0400 07/22/11 0230 07/21/11 1610 07/20/11 0415 07/19/11 0457  WBC 7.3 8.2 7.3 8.3 9.1  HGB 8.9* 9.0* 8.4* 9.4* 8.7*  HCT  27.2* 27.8* 27.6* 28.6* 26.4*  PLT 393 367 343 384 370    Basename 07/23/11 0400 07/22/11 0230 07/21/11 0357 07/20/11 0415 07/19/11 0457 07/18/11 0415 07/17/11 0500  NA 132* 134* 132* 132* 133* 134* 132*  K 3.8 3.7 3.8 3.6 3.4* 3.6 3.6  CL 104 104 103 103 103 106 104  CO2 23 23 22 23 23 22 23   BUN 17 18 17 17 16 16 16   CREATININE 0.88 0.94 0.97 0.98 0.98 0.94 0.92  GLUCOSE 195* 161* 158* 154* 189* 229* 220*  CALCIUM 8.0* 7.7* 7.7* 7.8* 7.6* 7.6* 7.6*   Lab Results  Component Value Date   INR 1.67* 07/23/2011   INR 1.63* 07/22/2011   INR  1.64* 07/21/2011    Examination:  General appearance: alert, cooperative and no distress Resp: clear to auscultation bilaterally Cardio: regular rate and rhythm, S1, S2 normal, no murmur, click, rub or gallop Extremities: extremities normal, atraumatic, no cyanosis or edema and Homans sign is negative, no sign of DVT  Wound Exam: clean, dry, intact   Drainage:  None: wound tissue dry  Motor Exam: EHL and FHL Intact  Sensory Exam: Deep Peroneal normal  Vascular Exam:    Assessment:    13 Days Post-Op  Procedure(s) (LRB): LAPAROSCOPY DIAGNOSTIC (N/A) LYSIS OF ADHESION (N/A) IMPACTION REMOVAL (N/A)  ADDITIONAL DIAGNOSIS:  Active Problems:  PE (pulmonary embolism)  Chronic anticoagulation  Fall from standing  Fracture of hip, right, closed  SBO (small bowel obstruction)  Fecal impaction in rectum  Chronic constipation  Acute respiratory failure with hypoxia  Tachycardia  A-fib  Acute Blood Loss Anemia   Plan:  Dressing changed.  Needs x-ray of right femur  Physical Therapy as ordered Weight Bearing as Tolerated (WBAT)  DVT Prophylaxis:  Lovenox  DISCHARGE PLAN: Skilled Nursing Facility/Rehab  DISCHARGE NEEDS: HHPT, CPM, Walker and 3-in-1 comode seat  Follow up with [facility] in 3 weeks for new x-ray and wound eval  Medicine needs CVA with left hemiparesis, continue heparin continue recommendations per neurology. CT  angiogram the neck shows occluded right ICA, Coumadin when tolerant of p.o. intake  Status post lysis of adhesions, small bowel obstruction which is now resolved, He did tolerate some liquid diet yesterday . Followup x-ray today nonobstructive bowel gas pattern  Hypokalemia, Now resolved,We will continue some potassium in IV fluids and potassium runs As needed  Acute on chronic kidney disease, Resolved  History of PE. Recent ultrasound shows chronic bilateral DVT  Status post GI bleed which required reversal of anticoagulation, EGD without definitive source, previous colonoscopy only revealed diverticulosis.  Anemia multifactorial , Hemoglobin stable no recurrent bleeding  Clostridium difficile, on Flagyl DC Flagyl after 14 days  Elevated CBGs, insulin has been added to TPN , If p.o. Diet tolerated, sliding-scale insulin and will be added as needed  .  Malnutrition continue TPN,Advance diet as tolerated  Decondition continue physical therapy . Physical therapy or recommendations noted in reference to inpatient rehabilitation     Tyreck Bell 07/23/2011, 12:19 PM

## 2011-07-23 NOTE — Progress Notes (Signed)
Patient ID: [name], male   DOB: [birth year], 78 y.o.   MRN: [id number] [name] 78 y.o.  Body mass index is 23.88 kg/(m^2).  Patient Active Problem List  Diagnoses  . PE (pulmonary embolism)  . Chronic anticoagulation  . Fall from standing  . Fracture of hip, right, closed  . Peripheral arterial disease  . SBO (small bowel obstruction)  . Fecal impaction in rectum  . Chronic constipation  . Acute respiratory failure with hypoxia  . Tachycardia  . A-fib    No Known Allergies  Past Surgical History  Procedure Date  . Hernia repair   . Appendectomy   . Femoral-femoral bypass graft   . Iliac artery stent   . Esophagogastroduodenoscopy 07/03/2011    Procedure: ESOPHAGOGASTRODUODENOSCOPY (EGD);  Surgeon: Shirley Friar, MD;  Location: [facility];  Service: Endoscopy;  Laterality: N/A;  . Compression hip screw 06/26/2011    Procedure: COMPRESSION HIP;  Surgeon: Raymon Mutton, MD;  Location: [facility];  Service: Orthopedics;  Laterality: Right;   Katy Apo, MD, MD 1. Intertrochanteric fracture of right hip   2. Acute respiratory failure with hypoxia   3. PE (pulmonary embolism)    [name], [name] ? Occupational therapy re consult for above as well.   He is passing flatus and reports he had a BM this am. His diet has been advanced to full liquids.   PE- ABD- +BS, mildly distended, but soft and non tender. Incisions are healing well   S/P LOA- now about 2 weeks post op- ileus resolving and diet advanced to fulls Multiple  complications including CVA and PE- management per primary service.   [name],PA-C Pager [phone] General Trauma Pager [phone]  [phone]  07/23/2011 7:46 AM

## 2011-07-24 ENCOUNTER — Inpatient Hospital Stay (HOSPITAL_COMMUNITY): Payer: Medicare HMO

## 2011-07-24 LAB — HEPARIN LEVEL (UNFRACTIONATED): Heparin Unfractionated: 0.53 IU/mL (ref 0.30–0.70)

## 2011-07-24 LAB — CBC
HCT: 27.9 % — ABNORMAL LOW (ref 39.0–52.0)
Hemoglobin: 8.9 g/dL — ABNORMAL LOW (ref 13.0–17.0)
MCH: 28.1 pg (ref 26.0–34.0)
MCHC: 31.9 g/dL (ref 30.0–36.0)
RBC: 3.17 MIL/uL — ABNORMAL LOW (ref 4.22–5.81)

## 2011-07-24 LAB — GLUCOSE, CAPILLARY

## 2011-07-24 LAB — PROTIME-INR: Prothrombin Time: 19.2 seconds — ABNORMAL HIGH (ref 11.6–15.2)

## 2011-07-24 MED ORDER — TRACE MINERALS CR-CU-MN-SE-ZN 10-1000-500-60 MCG/ML IV SOLN
INTRAVENOUS | Status: AC
  Administered 2011-07-24: 18:00:00 via INTRAVENOUS
  Filled 2011-07-24: qty 2000

## 2011-07-24 MED ORDER — DILTIAZEM HCL ER COATED BEADS 180 MG PO CP24
180.0000 mg | ORAL_CAPSULE | Freq: Every day | ORAL | Status: DC
  Administered 2011-07-24 – 2011-08-04 (×12): 180 mg via ORAL
  Filled 2011-07-24 (×13): qty 1

## 2011-07-24 MED ORDER — FAT EMULSION 20 % IV EMUL
240.0000 mL | INTRAVENOUS | Status: AC
  Administered 2011-07-24: 240 mL via INTRAVENOUS
  Filled 2011-07-24: qty 250

## 2011-07-24 MED ORDER — WARFARIN SODIUM 4 MG PO TABS
4.0000 mg | ORAL_TABLET | Freq: Once | ORAL | Status: AC
  Administered 2011-07-24: 4 mg via ORAL
  Filled 2011-07-24: qty 1

## 2011-07-24 NOTE — Consult Note (Signed)
Physical Medicine and Rehabilitation Consult Reason for Consult: Deconditioning/history of PE/CVA Referring Phsyician: Dr. polite [name] is an 78 y.o. male.   HPI: 78 year old right-handed male multi-medical with history of pulmonary emboli and deep vein thrombosis diagnosed July 2012 on chronic Coumadin therapy. Admitted [facility] February 20. Patient was visiting his wife in the hospital when he sustained a fall without loss of consciousness sustaining a right intertrochanteric hip fracture. Seen by orthopedic services allow his INR to slowly drift down and underwent open reduction internal fixation February 22 per Dr. Sherlean Foot. He is weightbearing as tolerated to right lower extremity. His chronic Coumadin has since been resumed. Hospital course complicated by dark maroon stools. Seen by GI services March 1 and underwent EGD that showed no evidence of ulcer or worrisome bleeding. His hemoglobin and hematocrit has remained stable. Patient developed abdominal distention with x-ray films and imaging of the abdomen revealing fecal impaction secondary to ileus. A nasogastric tube was placed with little change noted in obstruction and later underwent laparoscopic lysis of adhesions with open exploration with serosal repair as well as open exploration with enterotomy repair and fecal disimpaction March 8 per Dr. Michaell Cowing. Patient later placed on TPN for nutritional support. On March 9 patient noted to have left-sided weakness with initial CT scan showing old right frontal white matter infarcts and minimal chronic small vessel white matter changes in the left frontal lobe. Followup CT scan showed progression of infarcts and right watershed distribution. 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 Neurology service followup advised to continue chronic Coumadin therapy. 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 Patient with bouts of atrial fibrillation cardiac rate 140s. Echocardiogram with ejection fraction of 55% without emboli. Patient maintained on Cardizem as well as Lopressor. Patient treated for Clostridium difficile with Flagyl during his hospital course remains on contact precautions. Patient with profound deconditioning limited mobility with MCV requesting physical medicine rehabilitation consult  Review of Systems  Cardiovascular: Positive for palpitations.  Gastrointestinal: Positive for nausea and constipation.  Musculoskeletal: Positive for falls.  Neurological: Positive for weakness.  All other systems reviewed and are negative.   Past Medical History  Diagnosis Date  . Asthma   . Hypertension   . Pulmonary embolism   . Peripheral artery disease   . Hypercholesteremia   . Bursitis of right shoulder   . DVT of leg (deep venous thrombosis)    Past Surgical History  Procedure Date  . Hernia repair   . Appendectomy   . Femoral-femoral bypass graft   . Iliac artery stent   . Esophagogastroduodenoscopy 07/03/2011    Procedure: ESOPHAGOGASTRODUODENOSCOPY (EGD);  Surgeon: Shirley Friar, MD;  Location: [facility];  Service: Endoscopy;  Laterality: N/A;  . Compression hip screw 06/26/2011    Procedure: COMPRESSION HIP;  Surgeon: Raymon Mutton, MD;  Location: [facility];  Service: Orthopedics;  Laterality: Right;   History reviewed. No pertinent family history. Social History:  reports that he has quit smoking. He has never used smokeless tobacco. He reports that he does not drink alcohol or use illicit drugs. Allergies: No Known Allergies Medications Prior to Admission  Medication Dose Route Frequency Provider Last Rate Last Dose  . 0.9 %  sodium chloride infusion   Intravenous Continuous Rollene Fare, PHARMD 75 mL/hr at 07/09/11 1900    . acetaminophen (TYLENOL) solution 650  mg  650 mg Per Tube Q6H PRN Zigmund Gottron, MD   650 mg at 07/24/11 0846  . alum & mag hydroxide-simeth (MAALOX/MYLANTA) 200-200-20 MG/5ML suspension 15 mL  15 mL Oral Q3H PRN Darnelle Bos, MD   15 mL at 07/24/11 0846  . antiseptic oral rinse (BIOTENE) solution 15 mL  15 mL Mouth Rinse q12n4p Katy Apo, MD   15 mL at 07/23/11 1611  . aspirin suppository 300 mg  300 mg Rectal Once Leslye Peer, MD   300 mg at 07/12/11 1230  . bisacodyl (DULCOLAX) suppository 10 mg  10 mg Rectal Once Jacques Navy, MD   10 mg at 06/27/11 1135  . Boost Plus (BOOST Plus) liquid 237 mL  237 mL Oral Q1400 Ashley Jacobs, RD   237 mL at 07/23/11 1307  . ceFAZolin (ANCEF) IVPB 1 g/50 mL premix  1 g Intravenous Q6H [name], PA   1 g at 06/27/11 0827  . chlorhexidine (PERIDEX) 0.12 % solution 15 mL  15 mL Mouth Rinse BID Katy Apo, MD   15 mL at 07/24/11 0809  . diltiazem (CARDIZEM CD) 24 hr capsule 180 mg  180 mg Oral Daily Katy Apo, MD   180 mg at 07/24/11 1044  . fat emulsion 20 % infusion 240 mL  240 mL Intravenous Continuous TPN Thuyvan Thi Phan, PHARMD      . tpn solution (CLINIMIX E 5/20) 2,000 mL with multivitamins adult 10 mL, trace elements Cr-Cu-Mn-Se-Zn 1 mL infusion   Intravenous Continuous TPN Rollene Fare, PHARMD 60 mL/hr at 07/10/11 2200     And  . fat emulsion 20 % infusion 250 mL  250 mL Intravenous Continuous TPN Rollene Fare, PHARMD 10 mL/hr at 07/10/11 2200 250 mL at 07/10/11 2200  . tpn solution (CLINIMIX E 5/20) 1,800 mL with multivitamins adult 10 mL, trace elements Cr-Cu-Mn-Se-Zn 1 mL, insulin regular 18 Units infusion   Intravenous Continuous TPN Winfield Rast, PHARMD 75 mL/hr at 07/13/11 1825     And  . fat emulsion 20 % infusion 250 mL  250 mL Intravenous Continuous TPN Winfield Rast, PHARMD 10 mL/hr at 07/13/11 1824 250 mL at 07/13/11 1824  . fat emulsion 20 % infusion 250 mL  250 mL Intravenous Continuous TPN Rollene Fare, PHARMD 10 mL/hr at 07/15/11 1723 250 mL at  07/15/11 1723  . fat emulsion 20 % infusion 250 mL  250 mL Intravenous Continuous TPN Annia Belt, PHARMD 10 mL/hr at 07/17/11 1800 250 mL at 07/17/11 1800  . fat emulsion 20 % infusion 250 mL  250 mL Intravenous Continuous TPN Rollene Fare, PHARMD 10 mL/hr at 07/21/11 1600 250 mL at 07/21/11 1600  . fat emulsion 20 % infusion 250 mL  250 mL Intravenous Continuous TPN Rollene Fare, PHARMD 10 mL/hr at 07/22/11 1905 250 mL at 07/22/11 1905  . fentaNYL (SUBLIMAZE) injection 12.5 mcg  12.5 mcg Intravenous Q30 min PRN Laray Anger, DO   12.5 mcg at 06/24/11 1610  . furosemide (LASIX) injection 20 mg  20 mg Intravenous Once Jacques Navy, MD   20 mg at 06/28/11 1630  . heparin ADULT infusion 100 units/mL (25000 units/250 mL)  1,050 Units/hr Intravenous Continuous Theda Sers, PHARMD 10.5 mL/hr at 07/24/11 0903 1,050 Units/hr at 07/24/11 0903  . insulin aspart (novoLOG) injection 0-15 Units  0-15 Units Subcutaneous TID AC & HS Katy Apo, MD  3 Units at 07/24/11 0809  . iohexol (OMNIPAQUE) 300 MG/ML solution 100 mL  100 mL Intravenous Once PRN Medication Radiologist, MD   50 mL at 07/06/11 2053  . iohexol (OMNIPAQUE) 300 MG/ML solution 80 mL  80 mL Intravenous Once PRN Medication Radiologist, MD      . iohexol (OMNIPAQUE) 350 MG/ML injection 100 mL  100 mL Intravenous Once PRN Medication Radiologist, MD   100 mL at 07/13/11 1908  . lactated ringers bolus 1,000 mL  1,000 mL Intravenous Once Ardeth Sportsman, MD   1,000 mL at 07/07/11 1530  . lactated ringers bolus 1,000 mL  1,000 mL Intravenous Once Ardeth Sportsman, MD   1,000 mL at 07/11/11 0937  . lip balm (CARMEX) ointment 1 application  1 application Topical BID Ardeth Sportsman, MD   1 application at 07/24/11 1044  . magnesium sulfate IVPB 1 g 100 mL  1 g Intravenous Once Kalman Shan, MD   1 g at 07/12/11 1839  . magnesium sulfate IVPB 1 g 100 mL  1 g Intravenous Once Christine E Shade, PHARMD   1 g at 07/14/11  1023  . magnesium sulfate IVPB 4 g 100 mL  4 g Intravenous Once Ardeth Sportsman, MD   4 g at 07/10/11 0739  . metoCLOPramide (REGLAN) injection 5-10 mg  5-10 mg Intravenous Q8H PRN [name], PA   10 mg at 07/23/11 1311  . metoprolol (LOPRESSOR) 1 MG/ML injection           . metoprolol (LOPRESSOR) injection 5 mg  5 mg Intravenous Once Lonia Farber, MD   5 mg at 07/11/11 1628  . metoprolol (LOPRESSOR) injection 5 mg  5 mg Intravenous Q6H PRN Katy Apo, MD      . metoprolol tartrate (LOPRESSOR) tablet 25 mg  25 mg Oral BID Katy Apo, MD   25 mg at 07/24/11 1044  . mineral oil enema 1 enema  1 enema Rectal Once Barrie Folk, MD      . mineral oil enema 1 enema  1 enema Rectal Once Barrie Folk, MD      . morphine 2 MG/ML injection 2 mg  2 mg Intravenous Once Altha Harm, MD   2 mg at 06/24/11 1808  . morphine 2 MG/ML injection 2 mg  2 mg Intravenous Once Katy Apo, MD   2 mg at 07/04/11 0908  . pantoprazole (PROTONIX) EC tablet 40 mg  40 mg Oral Daily Katy Apo, MD   40 mg at 07/24/11 1044  . phenol (CHLORASEPTIC) mouth spray 1 spray  1 spray Mouth/Throat PRN Adolph Pollack, MD      . phytonadione (VITAMIN K) 10 mg in dextrose 5 % 50 mL IVPB  10 mg Intravenous Once Lillia Mountain, MD   10 mg at 07/03/11 0353  . potassium chloride 10 mEq in 100 mL IVPB  10 mEq Intravenous Q1 Hr x 5 Thuyvan Thi Phan, PHARMD   10 mEq at 07/10/11 1407  . potassium chloride 10 mEq in 100 mL IVPB  10 mEq Intravenous Q1 Hr x 6 Ardeth Sportsman, MD   10 mEq at 07/10/11 0902  . potassium chloride 10 mEq in 100 mL IVPB  10 mEq Intravenous Q1 Hr x 4 Elby Showers, MD   10 mEq at 07/12/11 1130  . potassium chloride 10 mEq in 100 mL IVPB  10 mEq Intravenous Q1 Hr x 6 Kalman Shan, MD  10 mEq at 07/13/11 0054  . potassium chloride 10 mEq in 100 mL IVPB  10 mEq Intravenous Q1 Hr x 5 Katy Apo, MD   10 mEq at 07/15/11 1231  . potassium chloride 10 mEq in 100 mL IVPB   10 mEq Intravenous Q1 Hr x 4 Katy Apo, MD   10 mEq at 07/16/11 1939  . potassium chloride 10 mEq in 100 mL IVPB  10 mEq Intravenous Q1 Hr x 3 Darnelle Bos, MD   10 mEq at 07/19/11 1155  . potassium chloride 10 mEq in 50 mL *CENTRAL LINE* IVPB  10 mEq Intravenous Q1 Hr x 6 Coralyn Helling, MD   10 mEq at 07/13/11 1156  . potassium chloride 10 mEq in 50 mL *CENTRAL LINE* IVPB  10 mEq Intravenous Q1 Hr x 6 Coralyn Helling, MD   10 mEq at 07/14/11 1351  . potassium chloride 10 mEq in 50 mL *CENTRAL LINE* IVPB  10 mEq Intravenous Q1 Hr x 3 Oretha Milch, MD   10 mEq at 07/14/11 2156  . potassium chloride 10 mEq in 50 mL *CENTRAL LINE* IVPB  10 mEq Intravenous Q1 Hr x 5 Adolph Pollack, MD   10 mEq at 07/16/11 1124  . sodium chloride 0.9 % bolus 500 mL  500 mL Intravenous Once Lonia Farber, MD   500 mL at 07/11/11 1536  . sodium chloride 0.9 % injection 10-40 mL  10-40 mL Intracatheter Q12H Katy Apo, MD   10 mL at 07/24/11 1045  . sodium chloride 0.9 % injection 10-40 mL  10-40 mL Intracatheter PRN Katy Apo, MD   10 mL at 07/14/11 1210  . sodium phosphate (FLEET) 7-19 GM/118ML enema 1 enema  1 enema Rectal Once Katy Apo, MD   1 enema at 06/30/11 0815  . sodium phosphate 10 mmol in dextrose 5 % 250 mL infusion  10 mmol Intravenous Once Christine E Shade, PHARMD   10 mmol at 07/14/11 1451  . tpn solution (CLINIMIX E 5/15) 1,000 mL infusion   Intravenous Continuous TPN Thuyvan Thi Phan, PHARMD      . tpn solution (CLINIMIX E 5/20) 1,000 mL with insulin regular 10 Units infusion   Intravenous Continuous TPN Rollene Fare, PHARMD 60 mL/hr at 07/12/11 1813    . tpn solution (CLINIMIX E 5/20) 1,000 mL with insulin regular 10 Units infusion   Intravenous Continuous TPN Rollene Fare, PHARMD 60 mL/hr at 07/13/11 1000    . tpn solution (CLINIMIX E 5/20) 1,800 mL with insulin regular 18 Units infusion   Intravenous Continuous TPN Winfield Rast, PHARMD 75 mL/hr at  07/14/11 1738    . tpn solution (CLINIMIX E 5/20) 1,800 mL with multivitamins adult 10 mL, trace elements Cr-Cu-Mn-Se-Zn 1 mL, insulin regular 22 Units infusion   Intravenous Continuous TPN Rollene Fare, PHARMD 75 mL/hr at 07/15/11 1722    . tpn solution (CLINIMIX E 5/20) 2,000 mL infusion   Intravenous Continuous TPN Rollene Fare, PHARMD 60 mL/hr at 07/11/11 1745    . tpn solution (CLINIMIX E 5/20) 2,000 mL with insulin regular 36 Units infusion   Intravenous Continuous TPN Rollene Fare, PHARMD 75 mL/hr at 07/16/11 1720    . tpn solution (CLINIMIX E 5/20) 2,000 mL with insulin regular 61 Units infusion   Intravenous Continuous TPN Annia Belt, PHARMD 75 mL/hr at 07/18/11 1800    . tpn solution (CLINIMIX E 5/20) 2,000 mL with insulin  regular 77 Units infusion   Intravenous Continuous TPN Annia Belt, PHARMD 75 mL/hr at 07/19/11 1744    . tpn solution (CLINIMIX E 5/20) 2,000 mL with insulin regular 84 Units infusion   Intravenous Continuous TPN Rollene Fare, PHARMD 75 mL/hr at 07/21/11 1800    . tpn solution (CLINIMIX E 5/20) 2,000 mL with insulin regular 84 Units infusion   Intravenous Continuous TPN Katy Apo, MD 75 mL/hr at 07/23/11 1837    . tpn solution (CLINIMIX E 5/20) 2,000 mL with multivitamins adult 10 mL, trace elements Cr-Cu-Mn-Se-Zn 1 mL, insulin regular 36 Units infusion   Intravenous Continuous TPN Annia Belt, PHARMD 75 mL/hr at 07/17/11 1800    . tpn solution (CLINIMIX E 5/20) 2,000 mL with multivitamins adult 10 mL, trace elements Cr-Cu-Mn-Se-Zn 1 mL, insulin regular 77 Units infusion   Intravenous Continuous TPN Rollene Fare, PHARMD 75 mL/hr at 07/21/11 1200    . tpn solution (CLINIMIX E 5/20) 2,000 mL with multivitamins adult 10 mL, trace elements Cr-Cu-Mn-Se-Zn 1 mL, insulin regular 84 Units infusion   Intravenous Continuous TPN Rollene Fare, PHARMD 75 mL/hr at 07/22/11 1858    . tpn solution (CLINIMIX E 5/20) 2,000 mL with  multivitamins adult 10 mL, trace elements Cr-Cu-Mn-Se-Zn 1 mL, insulin regular 88 Units infusion   Intravenous Continuous TPN Thuyvan Thi Phan, PHARMD      . vancomycin (VANCOCIN) 500 mg in sodium chloride irrigation 0.9 % 100 mL enema  500 mg Rectal Q6H Valarie Merino, MD   500 mg at 07/12/11 2338  . warfarin (COUMADIN) tablet 2 mg  2 mg Oral ONCE-1800 Gwen Her, PHARMD   2 mg at 06/28/11 1753  . warfarin (COUMADIN) tablet 2.5 mg  2.5 mg Oral ONCE-1800 Berkley Harvey, PHARMD   2.5 mg at 06/29/11 1610  . warfarin (COUMADIN) tablet 2.5 mg  2.5 mg Oral ONCE-1800 Katy Apo, MD   2.5 mg at 06/30/11 2253  . warfarin (COUMADIN) tablet 2.5 mg  2.5 mg Oral ONCE-1800 Thuyvan Thi Phan, PHARMD   2.5 mg at 07/01/11 1747  . warfarin (COUMADIN) tablet 3 mg  3 mg Oral Once Katy Apo, MD   3 mg at 07/23/11 1147  . warfarin (COUMADIN) tablet 4 mg  4 mg Oral Once Dorethea Clan, PHARMD   4 mg at 06/26/11 2053  . warfarin (COUMADIN) tablet 4 mg  4 mg Oral ONCE-1800 Thuyvan Thi Phan, PHARMD      . warfarin (COUMADIN) tablet 5 mg  5 mg Oral ONCE-1800 Gwen Her, PHARMD   5 mg at 06/27/11 1808  . Warfarin - Pharmacist Dosing Inpatient   Does not apply q1800 Katy Apo, MD      . DISCONTD: 0.9 %  sodium chloride infusion   Intravenous Continuous Laray Anger, DO      . DISCONTD: 0.9 %  sodium chloride infusion   Intravenous Continuous Altha Harm, MD 20 mL/hr at 06/24/11 1907 20 mL/hr at 06/24/11 1907  . DISCONTD: 0.9 %  sodium chloride infusion   Intravenous Continuous [name], PA 75 mL/hr at 06/28/11 2206    . DISCONTD: 0.9 %  sodium chloride infusion   Intravenous Continuous Lillia Mountain, MD      . DISCONTD: 0.9 %  sodium chloride infusion   Intravenous Continuous Katy Apo, MD 10 mL/hr at 07/04/11 1326 10 mL/hr at 07/04/11 1326  . DISCONTD: 0.9 %  sodium chloride infusion  Intravenous Continuous Thuyvan Thi Phan, PHARMD 100 mL/hr at 07/09/11  0600    . DISCONTD: 0.9 %  sodium chloride infusion   Intravenous Continuous Thuyvan Thi Phan, PHARMD      . DISCONTD: 0.9 %  sodium chloride infusion   Intravenous Continuous Rollene Fare, PHARMD      . DISCONTD: 0.9 %  sodium chloride infusion   Intravenous Continuous Vilinda Blanks Minor, NP 75 mL/hr at 07/12/11 1639    . DISCONTD: 0.9 %  sodium chloride infusion   Intravenous Continuous Katy Apo, MD      . DISCONTD: 0.9 %  sodium chloride infusion   Intravenous Continuous Darnelle Bos, MD      . DISCONTD: 0.9 % irrigation (POUR BTL)    PRN Raymon Mutton, MD   1,000 mL at 06/26/11 1441  . DISCONTD: 0.9 % irrigation (POUR BTL)    PRN Ardeth Sportsman, MD   2,000 mL at 07/10/11 1128  . DISCONTD: acetaminophen (TYLENOL) suppository 650 mg  650 mg Rectal Q6H PRN Katy Apo, MD      . DISCONTD: acetaminophen (TYLENOL) tablet 650 mg  650 mg Oral Q6H PRN Katy Apo, MD      . DISCONTD: acetaminophen (TYLENOL) tablet 650 mg  650 mg Oral Q6H PRN Katy Apo, MD      . DISCONTD: ADULT TPN   Intravenous Continuous TPN Katy Apo, MD      . DISCONTD: alum & mag hydroxide-simeth (MAALOX/MYLANTA) 200-200-20 MG/5ML suspension 30 mL  30 mL Oral Q4H PRN [name], PA   30 mL at 06/30/11 0640  . DISCONTD: atorvastatin (LIPITOR) tablet 10 mg  10 mg Oral q1800 Katy Apo, MD   10 mg at 07/09/11 1853  . DISCONTD: bisacodyl (DULCOLAX) suppository 10 mg  10 mg Rectal Daily PRN Altha Harm, MD      . DISCONTD: bisacodyl (DULCOLAX) suppository 10 mg  10 mg Rectal Q12H PRN Ardeth Sportsman, MD      . DISCONTD: bupivacaine-EPINEPHrine (MARCAINE W/ EPI) 0.25 % (with pres) injection    PRN Ardeth Sportsman, MD   50 mL at 07/10/11 1127  . DISCONTD: butamben-tetracaine-benzocaine (CETACAINE) spray    PRN Shirley Friar, MD   2 spray at 07/03/11 1428  . DISCONTD: diltiazem (CARDIZEM CD) 24 hr capsule 180 mg  180 mg Oral Daily Katy Apo, MD   180 mg at 07/05/11 1227    . DISCONTD: diltiazem (CARDIZEM) 100 mg in dextrose 5 % 100 mL infusion  5-15 mg/hr Intravenous Titrated Katy Apo, MD   15 mg/hr at 07/10/11 0902  . DISCONTD: diltiazem (CARDIZEM) 100 mg in dextrose 5 % 100 mL infusion  5-15 mg/hr Intravenous Continuous Zigmund Gottron, MD   5 mg/hr at 07/11/11 1542  . DISCONTD: diltiazem (CARDIZEM) 100 mg in dextrose 5 % 100 mL infusion  5-15 mg/hr Intravenous Continuous Vilinda Blanks Minor, NP      . DISCONTD: diltiazem (DILACOR XR) 24 hr capsule 180 mg  180 mg Oral Daily [name], PA   180 mg at 07/02/11 0919  . DISCONTD: docusate sodium (COLACE) capsule 100 mg  100 mg Oral BID Altha Harm, MD   100 mg at 07/02/11 2215  . DISCONTD: docusate sodium (COLACE) capsule 100 mg  100 mg Oral BID [name], PA      . DISCONTD: docusate sodium (COLACE) capsule 100 mg  100 mg Oral BID Windy Fast  Silas [place], MD   100 mg at 07/05/11 2226  . DISCONTD: enoxaparin (LOVENOX) injection 40 mg  40 mg Subcutaneous Q24H [name], PA      . DISCONTD: enoxaparin (LOVENOX) injection 60 mg  60 mg Subcutaneous [address] [place], PHARMD   60 mg at 06/28/11 0455  . DISCONTD: ertapenem (INVANZ) 1 g in sodium chloride 0.9 % 50 mL IVPB  1 g Intravenous Q24H Sherrie George, PA   1 g at 07/10/11 0847  . DISCONTD: feeding supplement (ENSURE CLINICAL STRENGTH) liquid 237 mL  237 mL Oral Once Katy Apo, MD      . DISCONTD: fentaNYL (SUBLIMAZE) injection 12.5-25 mcg  12.5-25 mcg Intravenous Q2H PRN Leslye Peer, MD      . DISCONTD: fentaNYL (SUBLIMAZE) injection 25-50 mcg  25-50 mcg Intravenous Q5 min PRN Phillips Grout, MD      . DISCONTD: fentaNYL (SUBLIMAZE) injection 25-50 mcg  25-50 mcg Intravenous Q5 min PRN Azell Der, MD   50 mcg at 07/10/11 1459  . DISCONTD: fentaNYL (SUBLIMAZE) injection 25-50 mcg  25-50 mcg Intravenous Q2H PRN Simonne Martinet, NP   25 mcg at 07/11/11 1320  . DISCONTD: fentaNYL NICU IV Syringe 50 mcg/mL    PRN Shirley Friar,  MD   12.5 mcg at 07/03/11 1433  . DISCONTD: ferrous fumarate (HEMOCYTE - 106 mg FE) tablet 106 mg of iron  1 tablet Oral BID Katy Apo, MD   106 mg of iron at 07/02/11 0919  . DISCONTD: heparin ADULT infusion 100 units/mL (25000 units/250 mL)  1,300 Units/hr Intravenous Continuous Katy Apo, MD 13 mL/hr at 07/13/11 1000 1,300 Units/hr at 07/13/11 1000  . DISCONTD: heparin ADULT infusion 100 units/mL (25000 units/250 mL)  1,450 Units/hr Intravenous Continuous Rollene Fare, PHARMD 14.5 mL/hr at 07/16/11 0821 1,450 Units/hr at 07/16/11 0821  . DISCONTD: heparin ADULT infusion 100 units/mL (25000 units/250 mL)  1,300 Units/hr Intravenous Continuous Leann Trefz Poindexter, PHARMD 13 mL/hr at 07/17/11 0130 1,300 Units/hr at 07/17/11 0130  . DISCONTD: heparin ADULT infusion 100 units/mL (25000 units/250 mL)  1,350 Units/hr Intravenous Continuous Christine E Shade, PHARMD 13.5 mL/hr at 07/18/11 1100 1,350 Units/hr at 07/18/11 1100  . DISCONTD: heparin ADULT infusion 100 units/mL (25000 units/250 mL)  1,150 Units/hr Intravenous Continuous Leann Trefz Poindexter, PHARMD 11.5 mL/hr at 07/21/11 0654 1,150 Units/hr at 07/21/11 0654  . DISCONTD: heparin ADULT infusion 100 units/mL (25000 units/250 mL)  1,450 Units/hr Intravenous Continuous Maryanna Shape [place]yon, PHARMD 14.5 mL/hr at 07/21/11 1900 1,450 Units/hr at 07/21/11 1900  . DISCONTD: heparin injection 5,000 Units  5,000 Units Subcutaneous Q8H Katy Apo, MD   5,000 Units at 07/10/11 [phone]  . DISCONTD: HYDROcodone-acetaminophen (NORCO) 5-325 MG per tablet 1-2 tablet  1-2 tablet Oral Q4H PRN [name], PA   2 tablet at 07/03/11 9604  . DISCONTD: HYDROcodone-acetaminophen (NORCO) 5-325 MG per tablet 2 tablet  2 tablet Oral Q4H PRN Katy Apo, MD   2 tablet at 07/09/11 1355  . DISCONTD: insulin aspart (novoLOG) injection 0-15 Units  0-15 Units Subcutaneous Q4H Rollene Fare, PHARMD   3 Units at 07/23/11 5409  . DISCONTD: insulin aspart  (novoLOG) injection 0-9 Units  0-9 Units Subcutaneous Q6H Theda Sers, PHARMD   5 Units at 07/11/11 8119  . DISCONTD: insulin aspart (novoLOG) injection 0-9 Units  0-9 Units Subcutaneous Q4H Rollene Fare, PHARMD   2 Units at 07/15/11 1478  . DISCONTD: lactated ringers  bolus 1,000 mL  1,000 mL Intravenous Q8H PRN Ardeth Sportsman, MD      . DISCONTD: lactated ringers infusion   Intravenous Continuous Phillips Grout, MD      . DISCONTD: lactated ringers irrigation solution    PRN Ardeth Sportsman, MD   1,000 mL at 07/10/11 1127  . DISCONTD: magic mouthwash  15 mL Oral QID PRN Ardeth Sportsman, MD      . DISCONTD: magnesium sulfate 1 g in dextrose 5 % 250 mL  1 g Intravenous Once Kalman Shan, MD      . DISCONTD: menthol-cetylpyridinium (CEPACOL) lozenge 3 mg  1 lozenge Oral PRN [name], PA      . DISCONTD: menthol-cetylpyridinium (CEPACOL) lozenge 3 mg  1 lozenge Oral PRN Ardeth Sportsman, MD      . DISCONTD: meperidine (DEMEROL) injection 6.5-12.5 mg  6.5-12.5 mg Intravenous Q5 min PRN Phillips Grout, MD      . DISCONTD: methocarbamol (ROBAXIN) 500 mg in dextrose 5 % 50 mL IVPB  500 mg Intravenous Q6H PRN [name], PA      . DISCONTD: methocarbamol (ROBAXIN) tablet 500 mg  500 mg Oral Q6H PRN [name], PA   500 mg at 07/01/11 2239  . DISCONTD: metoCLOPramide (REGLAN) tablet 5-10 mg  5-10 mg Oral Q8H PRN [name], PA      . DISCONTD: metoprolol (LOPRESSOR) injection 10 mg  10 mg Intravenous Q6H Darnelle Bos, MD   10 mg at 07/23/11 0537  . DISCONTD: metoprolol (LOPRESSOR) injection 10 mg  10 mg Intravenous PRN Katy Apo, MD      . DISCONTD: metoprolol (LOPRESSOR) injection 5 mg  5 mg Intravenous Q6H Katy Apo, MD   5 mg at 07/04/11 1124  . DISCONTD: metoprolol (LOPRESSOR) injection 5 mg  5 mg Intravenous Q6H PRN Katy Apo, MD   5 mg at 07/06/11 0981  . DISCONTD: metoprolol (LOPRESSOR) injection 5 mg  5 mg Intravenous Q6H Lonia Farber, MD   5 mg at 07/18/11 1117  . DISCONTD: metoprolol (LOPRESSOR) tablet 100 mg  100 mg Oral BID [name], PA      . DISCONTD: metoprolol (LOPRESSOR) tablet 100 mg  100 mg Oral BID Katy Apo, MD   100 mg at 07/05/11 2226  . DISCONTD: metoprolol tartrate (LOPRESSOR) tablet 100 mg  100 mg Oral BID Altha Harm, MD   100 mg at 07/02/11 2215  . DISCONTD: metroNIDAZOLE (FLAGYL) IVPB 500 mg  500 mg Intravenous Q6H Kalman Shan, MD   500 mg at 07/23/11 0538  . DISCONTD: midazolam (VERSED) injection 1-2 mg  1-2 mg Intravenous Q2H PRN Simonne Martinet, NP   2 mg at 07/10/11 1502  . DISCONTD: midazolam (VERSED) injection    PRN Shirley Friar, MD   1 mg at 07/03/11 1433  . DISCONTD: morphine 2 MG/ML injection 1 mg  1 mg Intravenous Q4H PRN Katy Apo, MD   1 mg at 07/03/11 1914  . DISCONTD: morphine 2 MG/ML injection 2 mg  2 mg Intravenous Q4H PRN Altha Harm, MD   2 mg at 06/24/11 1606  . DISCONTD: morphine 2 MG/ML injection 2 mg  2 mg Intravenous Q4H PRN Altha Harm, MD      . DISCONTD: morphine 2 MG/ML injection 2 mg  2 mg Intravenous Q4H PRN Katy Apo, MD   2 mg at 07/04/11 0557  . DISCONTD: morphine 2 MG/ML injection 2  mg  2 mg Intravenous Q4H PRN Jacques Navy, MD   2 mg at 07/09/11 1908  . DISCONTD: morphine 4 MG/ML injection 4 mg  4 mg Intravenous Q4H PRN Altha Harm, MD   4 mg at 06/27/11 1420  . DISCONTD: ondansetron (ZOFRAN) injection 4 mg  4 mg Intravenous Q6H PRN Altha Harm, MD   4 mg at 07/05/11 1448  . DISCONTD: ondansetron (ZOFRAN) injection 4 mg  4 mg Intravenous Q6H PRN [name], PA      . DISCONTD: ondansetron [facility]) tablet 4 mg  4 mg Oral Q6H PRN Altha Harm, MD      . DISCONTD: ondansetron [facility]) tablet 4 mg  4 mg Oral Q6H PRN [name], PA      . DISCONTD: pantoprazole (PROTONIX) 80 mg in sodium chloride 0.9 % 250 mL infusion  8 mg/hr Intravenous Continuous Lillia Mountain, MD 25  mL/hr at 07/04/11 0046 8 mg/hr at 07/04/11 0046  . DISCONTD: pantoprazole (PROTONIX) EC tablet 40 mg  40 mg Oral Q1200 [name], PA   40 mg at 07/02/11 1251  . DISCONTD: pantoprazole (PROTONIX) EC tablet 40 mg  40 mg Oral Q1200 Katy Apo, MD   40 mg at 07/05/11 1147  . DISCONTD: pantoprazole (PROTONIX) injection 40 mg  40 mg Intravenous Q24H Katy Apo, MD   40 mg at 07/21/11 1141  . DISCONTD: phenol (CHLORASEPTIC) mouth spray 1 spray  1 spray Mouth/Throat PRN [name], PA      . DISCONTD: phenol (CHLORASEPTIC) mouth spray 2 spray  2 spray Mouth/Throat PRN Ardeth Sportsman, MD      . DISCONTD: piperacillin-tazobactam (ZOSYN) IVPB 3.375 g  3.375 g Intravenous Q8H Maryanna Shape Runyon, PHARMD   3.375 g at 07/15/11 0243  . DISCONTD: polyethylene glycol (MIRALAX / GLYCOLAX) packet 17 g  17 g Oral Daily PRN Altha Harm, MD   17 g at 07/01/11 0948  . DISCONTD: polyethylene glycol (MIRALAX / GLYCOLAX) packet 17 g  17 g Oral Daily Katy Apo, MD   17 g at 07/02/11 0919  . DISCONTD: polyethylene glycol (MIRALAX / GLYCOLAX) packet 17 g  17 g Oral Daily Katy Apo, MD   17 g at 07/05/11 0900  . DISCONTD: promethazine (PHENERGAN) injection 6.25-12.5 mg  6.25-12.5 mg Intravenous Q15 min PRN Phillips Grout, MD      . DISCONTD: promethazine (PHENERGAN) injection 6.25-12.5 mg  6.25-12.5 mg Intravenous Q15 min PRN Azell Der, MD      . DISCONTD: simvastatin (ZOCOR) tablet 10 mg  10 mg Oral q1800 Altha Harm, MD   10 mg at 07/02/11 1728  . DISCONTD: simvastatin (ZOCOR) tablet 20 mg  20 mg Oral q1800 Katy Apo, MD   20 mg at 07/04/11 1832  . DISCONTD: sodium chloride 0.9 % 1,000 mL with potassium chloride 30 mEq infusion   Intravenous Continuous Katy Apo, MD 20 mL/hr at 07/21/11 1800    . DISCONTD: sterile water for irrigation for irrigation    PRN Ardeth Sportsman, MD   1,500 mL at 07/10/11 1129  . DISCONTD: tpn solution (CLINIMIX E 5/20) 1,000 mL with insulin  regular 10 Units infusion   Intravenous Continuous TPN Rollene Fare, PHARMD      . DISCONTD: tpn solution (CLINIMIX E 5/20) 1,000 mL with insulin regular 10 Units infusion   Intravenous Continuous TPN Rollene Fare, PHARMD      . DISCONTD: tpn  solution (CLINIMIX E 5/20) 2,000 mL with insulin regular 20 Units infusion   Intravenous Continuous TPN Rollene Fare, PHARMD      . DISCONTD: vancomycin (VANCOCIN) 500 mg in sodium chloride 0.9 % 100 mL IVPB  500 mg Intravenous Q12H Maryanna Shape Runyon, PHARMD   500 mg at 07/11/11 0639  . DISCONTD: vancomycin (VANCOCIN) IVPB 1000 mg/200 mL premix  1,000 mg Intravenous Q24H Rollene Fare, PHARMD   1,000 mg at 07/15/11 0115  . DISCONTD: vancomycin (VANCOCIN) IVPB 1000 mg/200 mL premix  1,000 mg Intravenous Q12H Katy Apo, MD   1,000 mg at 07/16/11 0820  . DISCONTD: warfarin (COUMADIN) tablet 5 mg  5 mg Oral ONCE-1800 Thuyvan Stacey Drain, PHARMD      . DISCONTD: Warfarin - Pharmacist Dosing Inpatient   Does not apply q1800 Hope Budds, PHARMD      . DISCONTD: Warfarin - Pharmacist Dosing Inpatient   Does not apply [id number] Katy Apo, MD      . DISCONTD: Warfarin - Pharmacist Dosing Inpatient   Does not apply [id number] Katy Apo, MD      . DISCONTD: Warfarin - Pharmacist Dosing Inpatient   Does not apply [id number] Katy Apo, MD      . DISCONTD: zolpidem (AMBIEN) tablet 5 mg  5 mg Oral QHS PRN [name], PA       No current outpatient prescriptions on file as of 07/24/2011.    Home: Home Living Lives With: Spouse Type of Home: House Home Layout: Two level Alternate Level Stairs-Rails: Right Alternate Level Stairs-Number of Steps: 7 Home Access: Level entry Bathroom Shower/Tub: Tub/shower unit Home Adaptive Equipment: None Additional Comments: Pt d/cing to snf  Functional History: Prior Function Level of Independence: Independent with basic ADLs;Independent with gait;Independent with homemaking with ambulation;Independent  with transfers (Requiring assist since recent admission for ORIF) Driving: Yes Vocation: Retired Functional Status:  Mobility: Bed Mobility Bed Mobility: Yes Rolling Right: 1: +2 Total assist Rolling Right Details (indicate cue type and reason): Pt=10%. Utilized bedpad for assistance. Multi-modal cues for encouragment, initiation of task, technique, head positioning. Rolling Left: 1: +2 Total assist Rolling Left Details (indicate cue type and reason): Pt assist 20%.  Required assist with trunk initiation and LE to attain sidelying position.  Cues provided for hand placement on bed rail and technique with LE.   (Performed x 3 reps for core strengthening. ) Left Sidelying to Sit: 1: +2 Total assist Left Sidelying to Sit Details (indicate cue type and reason): Pt assist 15%.  Provided assist for B LE off of bed, assist for trunk and UE.  Cues provided for UE placement to self assist trunk into sitting.  Supine to Sit: 1: +2 Total assist;HOB elevated (Comment degrees) Supine to Sit Details (indicate cue type and reason): pt10%; +2 for assist, safety, lines Sitting - Scoot to Edge of Bed: 1: +2 Total assist Sitting - Scoot to Edge of Bed Details (indicate cue type and reason): Pt assist 10%.  Used bed pad to assist pts hip forward with cues for hand placement.   Sit to Supine: 1: +2 Total assist;HOB flat;Patient percentage (comment) (Pt 50% A needed for trunk and BLES.) Sit to Supine - Details (indicate cue type and reason): pt 15% Transfers Transfers: Yes Sit to Stand: 1: +2 Total assist;From elevated surface Sit to Stand Details (indicate cue type and reason): pt=10%; assist for left LE stability, and for trunk control. cues due to pushing to left Stand  to Sit: 1: +2 Total assist;To bed Stand to Sit Details: pt dependent Stand Pivot Transfers: 4: Min Actuary Details (indicate cue type and reason): +2 for safety, assist to steady, pt did well with sliding LEs on floor to  turn and moving RW with verbal cues and increased time Squat Pivot Transfers: 1: +2 Total assist Squat Pivot Transfer Details (indicate cue type and reason): bed to recliner pt=10-15% Transfer via Lift Equipment: [place] Ambulation/Gait Ambulation/Gait: No Ambulation/Gait Assistance: 1: +2 Total assist Ambulation/Gait Assistance Details (indicate cue type and reason): pt=40-50%, assist to progress left right LE and cues for sequence Ambulation Distance (Feet): 3 Feet (turning around in room 3:1 to recliner (limited w/ HR 160)) Assistive device: Eva walker Gait Pattern: Step-to pattern;Decreased dorsiflexion - right;Decreased hip/knee flexion - right;Decreased stride length Gait velocity: B flex hips and knees, 75% VC's to extend and WB.  Pt c/o no hip pain, just max fatigue. Stairs: No Wheelchair Mobility Wheelchair Mobility: No  ADL: ADL Grooming: Simulated;Set up Where Assessed - Grooming: Sitting, chair;Supported Upper Body Bathing: Simulated;Minimal assistance Where Assessed - Upper Body Bathing: Sitting, chair;Supported Lower Body Bathing: Simulated;+2 Total assistance;Comment for patient % (Pt 0%) Where Assessed - Lower Body Bathing: Sit to stand from bed Upper Body Dressing: Simulated;Minimal assistance Where Assessed - Upper Body Dressing: Sitting, chair;Supported Lower Body Dressing: +2 Total assistance;Comment for patient % (Pt 0%) Where Assessed - Lower Body Dressing: Sit to stand from bed Toilet Transfer: Simulated;Moderate assistance Toilet Transfer Details (indicate cue type and reason): + 2 for safety and lines. Pt stood for approximately 2 minutes. Cues for posture and hand placement. Toilet Transfer Method: Stand pivot Toileting - Clothing Manipulation: Simulated;+2 Total assistance;Comment for patient % (Pt 0%) Where Assessed - Toileting Clothing Manipulation: Standing Toileting - Hygiene: +2 Total assistance;Comment for patient % (Pt 0%) Where Assessed - Toileting  Hygiene: Standing Tub/Shower Transfer: Not assessed Tub/Shower Transfer Method: Not assessed Equipment Used: Rolling walker ADL Comments: Pt fatigued very quickly, very hesitant when mobilizing.  Cognition: Cognition Arousal/Alertness: Awake/alert Orientation Level: Oriented X4 Cognition Arousal/Alertness: Awake/alert Overall Cognitive Status: Appears within functional limits for tasks assessed Orientation Level: Oriented X4  Blood pressure 156/79, pulse 124, temperature 98.2 F (36.8 C), temperature source Oral, resp. rate 17, height 5\' 6"  (1.676 m), weight 71.1 kg (156 lb 12 oz), SpO2 98.00%. Physical Exam  Vitals reviewed. Constitutional: He is oriented to person, place, and time.  HENT:  Head: Normocephalic.  Neck: Neck supple. No thyromegaly present.  Cardiovascular:       Irregular irregular  Pulmonary/Chest:       Decreased breath sounds at the bases  Abdominal: He exhibits distension. There is no tenderness.  Musculoskeletal:       +1 edema lower extremities  Neurological: He is alert and oriented to person, place, and time.  Skin: Skin is warm and dry.  Psychiatric: He has a normal mood and affect.   motor strength is 4/5 in the right deltoid, biceps, triceps, grip 3 minus in the right hip flexor knee extensor 4 at the right ankle dorsiflexor plantar flexor Left upper extremities 2 minus at the deltoid, biceps, triceps, and grip left lower extremities 2 minus and left hip flexor knee extensor and 3 minus in the ankle dorsiflexor plantar flexor. Sensation is intact to light touch. There is no evidence of increased tone in the left upper and left lower family. Mood and affect are appropriate No evidence of aphasia or apraxia No evidence of ataxia  There is no evidence of visual field deficit  Results for orders placed during the hospital encounter of 06/24/11 (from the past 24 hour(s))  GLUCOSE, CAPILLARY     Status: Abnormal   Collection Time   07/23/11 12:06 PM       Component Value Range   Glucose-Capillary 181 (*) 70 - 99 (mg/dL)  HEPARIN LEVEL (UNFRACTIONATED)     Status: Normal   Collection Time   07/23/11  1:30 PM      Component Value Range   Heparin Unfractionated 0.68  0.30 - 0.70 (IU/mL)  GLUCOSE, CAPILLARY     Status: Abnormal   Collection Time   07/23/11  4:04 PM      Component Value Range   Glucose-Capillary 228 (*) 70 - 99 (mg/dL)  GLUCOSE, CAPILLARY     Status: Abnormal   Collection Time   07/23/11  7:46 PM      Component Value Range   Glucose-Capillary 173 (*) 70 - 99 (mg/dL)  GLUCOSE, CAPILLARY     Status: Abnormal   Collection Time   07/23/11 10:18 PM      Component Value Range   Glucose-Capillary 161 (*) 70 - 99 (mg/dL)  HEPARIN LEVEL (UNFRACTIONATED)     Status: Normal   Collection Time   07/24/11  4:30 AM      Component Value Range   Heparin Unfractionated 0.53  0.30 - 0.70 (IU/mL)  PROTIME-INR     Status: Abnormal   Collection Time   07/24/11  4:30 AM      Component Value Range   Prothrombin Time 19.2 (*) 11.6 - 15.2 (seconds)   INR 1.58 (*) 0.00 - 1.49   GLUCOSE, CAPILLARY     Status: Abnormal   Collection Time   07/24/11  8:01 AM      Component Value Range   Glucose-Capillary 153 (*) 70 - 99 (mg/dL)   Comment 1 Notify RN     Comment 2 Documented in Chart    CBC     Status: Abnormal   Collection Time   07/24/11 10:00 AM      Component Value Range   WBC 7.2  4.0 - 10.5 (K/uL)   RBC 3.17 (*) 4.22 - 5.81 (MIL/uL)   Hemoglobin 8.9 (*) 13.0 - 17.0 (g/dL)   HCT 30.8 (*) 65.7 - 52.0 (%)   MCV 88.0  78.0 - 100.0 (fL)   MCH 28.1  26.0 - 34.0 (pg)   MCHC 31.9  30.0 - 36.0 (g/dL)   RDW 84.6 (*) 96.2 - 15.5 (%)   Platelets 389  150 - 400 (K/uL)   Dg Hip Portable 1 View Right  07/23/2011  *RADIOLOGY REPORT*  Clinical Data: Status post ORIF for right hip fracture.  PORTABLE RIGHT HIP - 1 VIEW  Comparison: Intraoperative radiographs dated 06/26/2011.  Findings: Dynamic hip compression screw is again seen traversing a  intertrochanteric fracture of the right hip, without evidence of hardware loosening or other hardware related complications. Surgical clips is seen inferior to the right hip joint.  Fracture fragments show early signs of healing and incorporation.  IMPRESSION: 1.  Postoperative changes of a dynamic compression screw fixation for intertrochanteric fracture of the right hip, without complicating features, as above.  Original Report Authenticated By: Florencia Reasons, M.D.    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 In addition he has right lower extremity weakness associated with history of right hip fracture which has not had adequate rehabilitation do to  his complicating medical factors.  1. Does the need for close, 24 hr/day medical supervision in concert with the patient's rehab needs make it unreasonable for this patient to be served in a less intensive setting? Potentially 2. Co-Morbidities requiring supervision/potential complications: Uncontrolled ventricular response with rapid A. fib, bowel incontinence with diarrhea history of recent small bowel obstruction, history of pulmonary embolism, malnutrition on total parenteral nutrition 3. Due to bladder management, bowel management, skin/wound care, disease management, medication administration, pain management and patient education, does the patient require 24 hr/day rehab nursing? Potentially 4. Does the patient require coordinated care of a physician, rehab nurse, PT (0.5-1 hrs/day, 5 days/week) and OT (0.5-1 hrs/day, 5 days/week) to address physical and functional deficits in the context of the above medical diagnosis(es)? Potentially Addressing deficits in the following areas: balance, endurance, locomotion, strength, transferring, bowel/bladder control, bathing, dressing, feeding, grooming and toileting 5. Can the patient actively participate in an intensive therapy program of at least 3 hrs of therapy  per day at least 5 days per week? No 6. The potential for patient to make measurable gains while on inpatient rehab is poor 7. Anticipated functional outcomes upon discharge from inpatients are not applicable PT, not applicable OT, not applicable SLP 8. Estimated rehab length of stay to reach the above functional goals is: Not applicable 9. Does the patient have adequate social supports to accommodate these discharge functional goals? Potentially 10. Anticipated D/C setting: Home 11. Anticipated post D/C treatments: HH therapy 12. Overall Rehab/Functional Prognosis: fair  RECOMMENDATIONS: This patient's condition is appropriate for continued rehabilitative care in the following setting: LTACH Patient has agreed to participate in recommended program. Potentially Note that insurance prior authorization may be required for reimbursement for recommended care.  Comment: Because of poor exercise tolerance as well as continuing problems with atrial flutter ablation rapid ventricular response as well as need for TPN, I recommend the patient first goes to [facility] and then be reevaluated for CIR   ANGIULLI,DANIEL J. 07/24/2011

## 2011-07-24 NOTE — Progress Notes (Signed)
Physical Therapy Treatment Patient Details Name: [name] MRN: [id number] DOB: 1933/12/17 Today's Date: 07/24/2011  PT Assessment/Plan  PT - Assessment/Plan Comments on Treatment Session: Patient still with pusher tendencies.  He could not tolerate edge of bed activities today due to increased HR.  Was able to pivot OOB, but unsafely due to weakness/level of fatigue. PT Plan: Discharge plan remains appropriate PT Frequency: Min 3X/week Follow Up Recommendations: Skilled nursing facility;LTACH Equipment Recommended: Defer to next venue PT Goals  Acute Rehab PT Goals Pt will Roll Supine to Right Side: with mod assist PT Goal: Rolling Supine to Right Side - Progress: Progressing toward goal Pt will go Supine/Side to Sit: with mod assist PT Goal: Supine/Side to Sit - Progress: Progressing toward goal Pt will Sit at [facility] of Bed: 6-10 min;with unilateral upper extremity support;with min assist;with cues (comment type and amount) PT Goal: Sit at [facility] Of Bed - Progress: Not progressing (due to increased HR) Pt will Transfer Bed to Chair/Chair to Bed: with +2 total assist (pt=50%) PT Transfer Goal: Bed to Chair/Chair to Bed - Progress: Progressing toward goal Pt will Perform Home Exercise Program: with min assist PT Goal: Perform Home Exercise Program - Progress: Progressing toward goal  PT Treatment Precautions/Restrictions  Precautions Precautions: Fall Precaution Comments: S/P ORIF right hip--no precautions Required Braces or Orthoses: No Restrictions Weight Bearing Restrictions: Yes RLE Weight Bearing: Weight bearing as tolerated Other Position/Activity Restrictions: Pt aware he is WBAT Mobility (including Balance) Bed Mobility Rolling Left: 1: +2 Total assist Rolling Left Details (indicate cue type and reason): pt=20% cues for technique, assist to bend right LE and reach for rail with right UE Left Sidelying to Sit: 1: +2 Total assist Left Sidelying to Sit Details (indicate cue  type and reason): assist and cues for technique to allow for left UE weight bearing while lifting trunk upright. pt=15% Sitting - Scoot to Edge of Bed: 1: +2 Total assist Sitting - Scoot to Edge of Bed Details (indicate cue type and reason): with pad under patient while assist for balance; pt=10% Transfers Squat Pivot Transfers: 1: +2 Total assist Squat Pivot Transfer Details (indicate cue type and reason): pt=10% weak and fatigued, sat on edge of chair and needed total assist to scoot back for safety  Static Sitting Balance Static Sitting - Comment/# of Minutes: today required total assist for balance with HR already elevated despite RN stating she had given meds.  Unable to sit unsupported long due to fatigue Exercise  Total Joint Exercises Ankle Circles/Pumps: AROM;AAROM;Left;Right;15 reps;Supine Short Arc Quad: AROM;Right;10 reps;Supine Heel Slides: AAROM;Both;10 reps;Supine (with resistance to left LE extension) Hip ABduction/ADduction: AAROM;Right;10 reps;Supine End of Session PT - End of Session Equipment Utilized During Treatment: Gait belt Activity Tolerance: Patient limited by fatigue Patient left: in chair;with call bell in reach Nurse Communication: Need for lift equipment General Behavior During Session: [facility] for tasks performed Cognition: [facility] for tasks performed  [facility] 07/24/2011, 1:30 PM

## 2011-07-24 NOTE — Progress Notes (Signed)
PT here to get pt OOB, Dr. Nehemiah Settle notified of pt's HR in 130's to 140's, MD ok with pt getting OOB with this HR.

## 2011-07-24 NOTE — Consult Note (Signed)
Admit date: 06/24/2011 Referring Physician  Renford Dills, M.D. Primary Physician  Renford Dills, M.D. Primary Cardiologist  Gwynneth Albright, M.D. Reason for Consultation  tachycardia  ASSESSMENT: 1. Sinus tachycardia, uncertain cause. Multifactorial including anemia, debility, and ?other unidentified issues. The echocardiogram demonstrated a smaller than normal LV cavity of uncertain significance. TSH is normal.  2. Recent CVA.  3. Chronic DVT, Bilateral.  4. Hip fracture 4 weeks ago  5. Anemia  6. GI bleed while hospitalized   PLAN:  1. Continue low-dose beta blocker therapy.  2. T3 level and plasma catecholamine   HPI: The patient has no cardiovascular complaints other than his heart rate is been increased since he was admitted to the hospital. There is no prior history of heart disease. He denies chest pain and dyspnea. He has had bilateral DVT. No documentation of pulmonary emboli. He's had a CVA and GI bleed since hospitalization.   PMH:   Past Medical History  Diagnosis Date  . Asthma   . Hypertension   . Pulmonary embolism   . Peripheral artery disease   . Hypercholesteremia   . Bursitis of right shoulder   . DVT of leg (deep venous thrombosis)      PSH:   Past Surgical History  Procedure Date  . Hernia repair   . Appendectomy   . Femoral-femoral bypass graft   . Iliac artery stent   . Esophagogastroduodenoscopy 07/03/2011    Procedure: ESOPHAGOGASTRODUODENOSCOPY (EGD);  Surgeon: Shirley Friar, MD;  Location: [facility];  Service: Endoscopy;  Laterality: N/A;  . Compression hip screw 06/26/2011    Procedure: COMPRESSION HIP;  Surgeon: Raymon Mutton, MD;  Location: [facility];  Service: Orthopedics;  Laterality: Right;    Allergies:  Review of patient's allergies indicates no known allergies. Prior to Admit Meds:   Prescriptions prior to admission  Medication Sig Dispense Refill  . clopidogrel (PLAVIX) 75 MG tablet Take 75 mg by mouth daily.       Marland Kitchen diltiazem (DILACOR XR) 180 MG 24 hr capsule Take 180 mg by mouth daily.      . metoprolol (LOPRESSOR) 100 MG tablet Take 100 mg by mouth 2 (two) times daily.      . pantoprazole (PROTONIX) 40 MG tablet Take 40 mg by mouth daily.      . simvastatin (ZOCOR) 20 MG tablet Take 10 mg by mouth every evening. Pt cuts tab in half for 10 mg dose      . warfarin (COUMADIN) 5 MG tablet Take 2.5-5 mg by mouth See admin instructions. Pt takes 1/2 of 5 mg  Tablet for 2.5 mg dose on Monday,Wednesday,Friday,sunday. On Tuesday,Thursday, Saturday pt takes 5 mg.       Fam HX:   History reviewed. No pertinent family history. Social HX:    History   Social History  . Marital Status: Married    Spouse Name: N/A    Number of Children: N/A  . Years of Education: N/A   Occupational History  . Not on file.   Social History Main Topics  . Smoking status: Former Games developer  . Smokeless tobacco: Never Used  . Alcohol Use: No  . Drug Use: No  . Sexually Active: No   Other Topics Concern  . Not on file   Social History Narrative  . No narrative on file     Review of Systems: No significant complaints other than decreased appetite  Physical Exam: Blood pressure 111/60, pulse 124, temperature 98.4  F (36.9 C), temperature source Axillary, resp. rate 20, height 5\' 6"  (1.676 m), weight 71.1 kg (156 lb 12 oz), SpO2 97.00%. Weight change: 4 kg (8 lb 13.1 oz)  Referral-appearing alert gentleman in no distress  Flat neck veins with the patient lying at 25.  Cardiac exam reveals tachycardia but no murmur, rub, click, or gallop.  Abdomen is nontender. Bowel sounds are softtly present.  Alert and oriented. Labs:   Lab Results  Component Value Date   WBC 7.2 07/24/2011   HGB 8.9* 07/24/2011   HCT 27.9* 07/24/2011   MCV 88.0 07/24/2011   PLT 389 07/24/2011    Lab 07/23/11 0400  NA 132*  K 3.8  CL 104  CO2 23  BUN 17  CREATININE 0.88  CALCIUM 8.0*  PROT 6.5  BILITOT 0.2*  ALKPHOS 90  ALT 9  AST  17  GLUCOSE 195*   No results found for this basename: PTT   Lab Results  Component Value Date   INR 1.58* 07/24/2011   INR 1.67* 07/23/2011   INR 1.63* 07/22/2011   Lab Results  Component Value Date   CKTOTAL 28 07/13/2011   CKMB 1.8 07/13/2011   TROPONINI <0.30 07/13/2011     Lab Results  Component Value Date   CHOL 52 07/20/2011   CHOL 54 07/14/2011   CHOL 64 07/13/2011   Lab Results  Component Value Date   HDL 14* 07/14/2011   Lab Results  Component Value Date   LDLCALC 28 07/14/2011   Lab Results  Component Value Date   TRIG 47 07/20/2011   TRIG 61 07/14/2011   TRIG 125 07/13/2011   Lab Results  Component Value Date   CHOLHDL 3.9 07/14/2011   No results found for this basename: LDLDIRECT      Radiology:  Dg Chest Port 1 View  07/24/2011  *RADIOLOGY REPORT*  Clinical Data: Follow up pneumonia  PORTABLE CHEST - 1 VIEW  Comparison: 07/14/2011  Findings: Cardiomediastinal silhouette is stable.  Stable right arm PICC line position with tip in SVC.  NG tube has been removed.  No pulmonary edema.  There is improvement in aeration.  Residual right base medially linear atelectasis or infiltrate.  No new infiltrate is noted.  IMPRESSION:  NG tube has been removed.  No pulmonary edema.  There is improvement in aeration.  Residual right base medially linear atelectasis or infiltrate.  No new infiltrate is noted.  Original Report Authenticated By: Natasha Mead, M.D.   Dg Hip Portable 1 View Right  07/23/2011  *RADIOLOGY REPORT*  Clinical Data: Status post ORIF for right hip fracture.  PORTABLE RIGHT HIP - 1 VIEW  Comparison: Intraoperative radiographs dated 06/26/2011.  Findings: Dynamic hip compression screw is again seen traversing a intertrochanteric fracture of the right hip, without evidence of hardware loosening or other hardware related complications. Surgical clips is seen inferior to the right hip joint.  Fracture fragments show early signs of healing and incorporation.  IMPRESSION: 1.   Postoperative changes of a dynamic compression screw fixation for intertrochanteric fracture of the right hip, without complicating features, as above.  Original Report Authenticated By: Florencia Reasons, M.D.   EKG:  Sinus tachycardia without other abnormality noted on 07/06/11  ECHO: 07/13/11 Study Conclusions  Left ventricle: The cavity size was below normal. Wall thickness was normal. Systolic function was normal. The estimated ejection fraction was in the range of 50% to 55%. Possible moderate hypokinesis of the basal-midanteroseptal myocardium. Doppler parameters are consistent with  abnormal left ventricular relaxation (grade 1 diastolic dysfunction). Doppler parameters are consistent with high ventricular filling pressure.  Impressions:  - No cardiac source of embolism was identified, but cannot be ruled out on the basis of this examination.   Lesleigh Noe 07/24/2011 7:44 PM

## 2011-07-24 NOTE — Progress Notes (Signed)
PARENTERAL NUTRITION and ANTICOAGULATION CONSULT NOTE - FOLLOW UP  Pharmacy Consult for TNA Indication: SBO  Pharmacy Consult for Heparin/Coumadin Indication: VTE, afib, and r/o CVA  No Known Allergies  Patient Measurements: Height: 5\' 6"  (167.6 cm) Weight: 156 lb 12 oz (71.1 kg) IBW/kg (Calculated) : 63.8  Weight on admit: 162 lb (73kg)  Vital Signs: Temp: 98.3 F (36.8 C) (03/22 0400) Temp src: Oral (03/22 0400) BP: 140/75 mmHg (03/22 0400) Pulse Rate: 127  (03/22 0400)  Intake/Output from previous day: 03/21 0701 - 03/22 0700 In: 2308.7 [P.O.:120; I.V.:353.7; TPN:1835] Out: 2700 [Urine:2700]  Labs:  [facility] 07/24/11 0430 07/23/11 0400 07/22/11 0230 07/21/11 1610  WBC -- 7.3 8.2 7.3  HGB -- 8.9* 9.0* 8.4*  HCT -- 27.2* 27.8* 27.6*  PLT -- 393 367 343  APTT -- -- -- --  INR 1.58* 1.67* 1.63* --     Basename 07/23/11 0400 07/22/11 0230  NA 132* 134*  K 3.8 3.7  CL 104 104  CO2 23 23  GLUCOSE 195* 161*  BUN 17 18  CREATININE 0.88 0.94  LABCREA -- --  CREAT24HRUR -- --  CALCIUM 8.0* 7.7*  MG 1.8 --  PHOS 2.9 --  PROT 6.5 --  ALBUMIN 1.7* --  AST 17 --  ALT 9 --  ALKPHOS 90 --  BILITOT 0.2* --  BILIDIR -- --  IBILI -- --  PREALBUMIN -- --  TRIG -- --  CHOLHDL -- --  CHOL -- --   Estimated Creatinine Clearance: 65.5 ml/min (by C-G formula based on Cr of 0.88).   IV Fluid:  None   Nutritional Goals:   1850-2110 kCal, 79-92 grams of protein per day  Clinimix E 5/20 22ml/hr + 20% Lipids 11ml/hr will provide 90gm protein, 1584 kcal on non-lipid days, 2064 kcal on lipid days, average 1790 kcal/week  Current Nutrition:  Clinimix E5/20 at 75 ml/hr +/- IV lipids at 23ml/hr on MWF. Full liquid started 3/21  CBGs: 228, 173, 161 on current formula -> requiring 14 units SSI 3/21.  Assessment:  85 YOM s/p ORIF 2/22, GI bleed 2/28 thought to be due to diverticular disease, CT 3/4 consistent with SBO.   POD#14 LOA, serosal/enterotomy repair, fecal  disimpaction.  Tolerating TNA at goal rate of 75 ml/hr  Lytes 3/21: K stable. Na+ remains low(unable to adjust in TNA). Corrected calcium wnl (9.8).  Prealbumin: 13.6(3/18), next level 3/25  Lipids: have remained normal  CBGs: improving with significant increase insulin in TNA 3/17 and 3/19.  Currently has 84 units insulin/2L bag (provides ~75 units/24hr).  Approaching goal of <150.   GI Prophylaxis: PO Protonix, Reglan IV q8h prn  Surgery noted may want to calorie count to see if TNA can be tapered off soon.  Continue full liquids for now.   Plan:   Continue Clinimix E5/20 at goal rate of 75 ml/hr  Fat emulsion at 10 ml/hr, standard multivitamins and trace elements on MWF only due to ongoing shortage. Will increase insulin to 88 units/2L bag to achieve CGBs < 150 while on TNA Let us know when we can start tapering TNA to help with appetite.  ___________________________________________________    ANTICOAGULATION CONSULT NOTE - Follow Up Consult  Labs:  Basename 07/24/11 0430 07/23/11 1330 07/23/11 0400 07/22/11 0230 07/21/11 1610  HGB -- -- 8.9* 9.0* --  HCT -- -- 27.2* 27.8* 27.6*  PLT -- -- 393 367 343  APTT -- -- -- -- --  LABPROT 19.2* -- 20.0* 19.6* --  INR  1.58* -- 1.67* 1.63* --  HEPARINUNFRC 0.53 0.68 0.67 -- --  CREATININE -- -- 0.88 0.94 --  CKTOTAL -- -- -- -- --  CKMB -- -- -- -- --  TROPONINI -- -- -- -- --   Estimated Creatinine Clearance: 65.5 ml/min (by C-G formula based on Cr of 0.88).   Assessment:  77 yom on chronic warfarin for history of PE, DVTs, s/p ORIF on 2/22, ex-lap with LOA, enterotomy 3/8, acute CVA 3/10.  Currently on heparin drip and Coumadin resumed 3/21.  Lower heparin level goal of 0.3 to 0.5 requested per MD.  Heparin gtt lowered yesterday and Heparin level just slightly above goal today.   Home Coumadin dose : 2.5mg  MWFSu, 5mg  other days.  INR 1.58 today.  No bleeding reported/documented   Goal of Therapy:  Heparin level  0.3-0.5   Plan:   Reduce heparin gtt to 1050 units/hr to obtain goal.    Will recheck heparin level in AM  Coumadin 4 mg po x 1 tonight  Pharmacy will follow up daily  Geoffry Paradise Thi 07/24/2011,8:03 AM

## 2011-07-24 NOTE — Progress Notes (Signed)
Comments:  07/24/2011 Raynelle Bring BSN CCM [phone] PER CHART REVIEW INPATIENT REHAB RECOMMENDED LTAC; LTAC REP NOTIFIED-CASE WAS REVIEWED. REP ADVISED THAT PER pt's insurance-[employer] guidelines are not met and patient is not appropriate for LTAC. CSW notified.

## 2011-07-24 NOTE — Progress Notes (Signed)
Since pt got OOB to chair, pt's HR has been sustaining in the 140's, MD notified, PRN Metoprolol given per MD order.

## 2011-07-24 NOTE — Progress Notes (Signed)
Speech Language/Pathology Speech Pathology: Dysphagia Treatment Note  Patient was observed with :  Boost liquids via straw, self feeding without assistance appox 1 ounce.  Pt reports he requires assistance to eat or he spills it.  Pt acknowledges less reflux symptoms today, per RN pt can receive Maalox every 3 hours prn.   SLP spoke to RN who reports pt receiving hand-over-hand assistance to eat. Pt reports oropharyngeal swallow ability is at his baseline, acknowledges less dyspenea with po intake compared to 07/20/11.  Pt dislikes taste of pudding and doesn't eat grits.  He does enjoy Boost and continues with TNA.   Patient was noted to have s/s of aspiration : No  Lung Sounds:  Productive cough to tan secretions not correlated to po per pt and RN Temperature: Afebrile  Patient required:  No cues, pt recalled need to take rest breaks if dyspnenic or if coughing with po intake  Clinical Impression: Oropharyngeal swallow ability likely at baseline currently, recommend continue asp precautions d/t multiple risk factors including deconditioning, decr mobility.  Strong cough present indicating good sign for his airway protection.   Pt educated to risk factors for asp pna and verbalized understanding.  No further SLP indicated.  Recommendations:  Continue diet per surgery and PCP with precautions.    Pain:   none Intervention Required:   no  Goals: All Goals Met  [name], MS [facility] SLP [phone]

## 2011-07-24 NOTE — Progress Notes (Signed)
Patient ID: [name], male   DOB: 07-Mar-1934, 78 y.o.   MRN: [id number] [name] 78 y.o.  Body mass index is 25.30 kg/(m^2).  Patient Active Problem List  Diagnoses  . PE (pulmonary embolism)  . Chronic anticoagulation  . Fall from standing  . Fracture of hip, right, closed  . Peripheral arterial disease  . SBO (small bowel obstruction)  . Fecal impaction in rectum  . Chronic constipation  . Acute respiratory failure with hypoxia  . Tachycardia  . A-fib    No Known Allergies  Past Surgical History  Procedure Date  . Hernia repair   . Appendectomy   . Femoral-femoral bypass graft   . Iliac artery stent   . Esophagogastroduodenoscopy 07/03/2011    Procedure: ESOPHAGOGASTRODUODENOSCOPY (EGD);  Surgeon: Shirley Friar, MD;  Location: [facility];  Service: Endoscopy;  Laterality: N/A;  . Compression hip screw 06/26/2011    Procedure: COMPRESSION HIP;  Surgeon: Raymon Mutton, MD;  Location: [facility];  Service: Orthopedics;  Laterality: Right;   Katy Apo, MD, MD 1. Intertrochanteric fracture of right hip   2. Acute respiratory failure with hypoxia   3. PE (pulmonary embolism)   SUBJECTIVE:  Pt reports 5 loose stools yesterday, but none overnight. He is tolerating full liquids and po supplements.  He continues to have significant left hemiparesis.    PE- ABD- +BS, moderately distended, but soft and non tender. Incisions are healing well   S/P LOA- now about 2 weeks post op- ileus resolving and diet advanced to fulls Hx C Diff- continued Flagyl through 3/21 for 10 day course, some loose stools yesterday, but none   Overnight , will monitor closely for recurrence. He does seem more distended today. Multiple medical issues per primary service. CVA- continues therapies- They rec CIR PE/DVT- continues Heparin/warfarin per pharmacy FEN- ileus resolving. Continue Full liquids and supplements, may want calorie count to see if TNA   Can be tapered off soon. Hip  fx- ORIF- per Dr. Flora Lipps- continue full liquids for now as abd seems slightly more distended today    Marcos Peloso,PA-C Pager [phone] General Trauma Pager [phone]  [phone]  07/24/2011 8:11 AM

## 2011-07-24 NOTE — Progress Notes (Signed)
Called Linda from Case Management, she will call LTAC for consult.

## 2011-07-24 NOTE — Progress Notes (Signed)
CSW has been following this pt to assist with D/C planning. Prior to ICU trans pt was planning to have ST rehab at [facility]. PT is recommending CIR consult. CSW will assist with SNF placement if CIR isunable to accept pt. for ST rehab. Pt has [employer] which requires prior authorization for SNF which cannot be initiated until d/c plan is determined.   Cori Razor  LCSW  [phone]

## 2011-07-24 NOTE — Progress Notes (Signed)
Subjective: Patient is doing well, tolerating his diet. No nausea no vomiting he did have a few loose stools however they have slowed up. Patient's heart rate remains elevated. Still sinus tach,  Objective: Vital signs in last 24 hours: Temp:  [97.4 F (36.3 C)-98.9 F (37.2 C)] 98.3 F (36.8 C) (03/22 0400) Pulse Rate:  [116-132] 127  (03/22 0400) Resp:  [20-23] 20  (03/22 0400) BP: (127-141)/(65-80) 140/75 mmHg (03/22 0400) SpO2:  [98 %-100 %] 99 % (03/22 0400) Weight:  [71.1 kg (156 lb 12 oz)] 71.1 kg (156 lb 12 oz) (03/22 0400) Weight change: 4 kg (8 lb 13.1 oz) Last BM Date: 07/23/11  Intake/Output from previous day: 03/21 0701 - 03/22 0700 In: 2308.7 [P.O.:120; I.V.:353.7; TPN:1835] Out: 2700 [Urine:2700] Intake/Output this shift:    General appearance: alert and cooperative Resp: clear to auscultation bilaterally GI: soft, positive bowel sounds, slight protuberant, nontender Extremities: extremities normal, atraumatic, no cyanosis or edema Neurologic: Motor: left hemiparesis lower extremity greater than upper  Lab Results:  Results for orders placed during the hospital encounter of 06/24/11 (from the past 24 hour(s))  GLUCOSE, CAPILLARY     Status: Abnormal   Collection Time   07/23/11 12:06 PM      Component Value Range   Glucose-Capillary 181 (*) 70 - 99 (mg/dL)  HEPARIN LEVEL (UNFRACTIONATED)     Status: Normal   Collection Time   07/23/11  1:30 PM      Component Value Range   Heparin Unfractionated 0.68  0.30 - 0.70 (IU/mL)  GLUCOSE, CAPILLARY     Status: Abnormal   Collection Time   07/23/11  4:04 PM      Component Value Range   Glucose-Capillary 228 (*) 70 - 99 (mg/dL)  GLUCOSE, CAPILLARY     Status: Abnormal   Collection Time   07/23/11  7:46 PM      Component Value Range   Glucose-Capillary 173 (*) 70 - 99 (mg/dL)  GLUCOSE, CAPILLARY     Status: Abnormal   Collection Time   07/23/11 10:18 PM      Component Value Range   Glucose-Capillary 161 (*) 70 -  99 (mg/dL)  HEPARIN LEVEL (UNFRACTIONATED)     Status: Normal   Collection Time   07/24/11  4:30 AM      Component Value Range   Heparin Unfractionated 0.53  0.30 - 0.70 (IU/mL)  PROTIME-INR     Status: Abnormal   Collection Time   07/24/11  4:30 AM      Component Value Range   Prothrombin Time 19.2 (*) 11.6 - 15.2 (seconds)   INR 1.58 (*) 0.00 - 1.49   GLUCOSE, CAPILLARY     Status: Abnormal   Collection Time   07/24/11  8:01 AM      Component Value Range   Glucose-Capillary 153 (*) 70 - 99 (mg/dL)   Comment 1 Notify RN     Comment 2 Documented in Chart        Studies/Results: Dg Hip Portable 1 View Right  07/23/2011  *RADIOLOGY REPORT*  Clinical Data: Status post ORIF for right hip fracture.  PORTABLE RIGHT HIP - 1 VIEW  Comparison: Intraoperative radiographs dated 06/26/2011.  Findings: Dynamic hip compression screw is again seen traversing a intertrochanteric fracture of the right hip, without evidence of hardware loosening or other hardware related complications. Surgical clips is seen inferior to the right hip joint.  Fracture fragments show early signs of healing and incorporation.  IMPRESSION: 1.  Postoperative  changes of a dynamic compression screw fixation for intertrochanteric fracture of the right hip, without complicating features, as above.  Original Report Authenticated By: Florencia Reasons, M.D.    Medications:  Scheduled:   . antiseptic oral rinse  15 mL Mouth Rinse q12n4p  . Boost Plus  237 mL Oral Q1400  . chlorhexidine  15 mL Mouth Rinse BID  . diltiazem  180 mg Oral Daily  . insulin aspart  0-15 Units Subcutaneous TID AC & HS  . lip balm  1 application Topical BID  . metoprolol tartrate  25 mg Oral BID  . pantoprazole  40 mg Oral Daily  . sodium chloride  10-40 mL Intracatheter Q12H  . warfarin  3 mg Oral Once  . warfarin  4 mg Oral ONCE-1800  . Warfarin - Pharmacist Dosing Inpatient   Does not apply q1800  . DISCONTD: metoprolol  10 mg Intravenous Q6H    . DISCONTD: metronidazole  500 mg Intravenous Q6H   Continuous:   . fat emulsion    . fat emulsion 250 mL (07/22/11 1905)  . heparin 1,050 Units/hr (07/24/11 0903)  . TPN (CLINIMIX) +/- additives 75 mL/hr at 07/23/11 1837  . TPN (CLINIMIX) +/- additives 75 mL/hr at 07/22/11 1858  . TPN (CLINIMIX) +/- additives    . DISCONTD: sodium chloride    . DISCONTD: 0.9 % sodium chloride with kcl 20 mL/hr at 07/21/11 1800    Assessment/Plan: CVA with left hemiparesis, continue heparin continue recommendations per neurology. CT angiogram the neck shows occluded right ICA, Coumadin when tolerant of p.o. intake  Status post lysis of adhesions, small bowel obstruction which is now resolved, He did tolerate some liquid diet yesterday . Followup x-ray today nonobstructive bowel gas pattern  Hypokalemia, Now resolved,We will continue some potassium in IV fluids and potassium runs As needed  Acute on chronic kidney disease, Resolved  History of PE. Recent ultrasound shows chronic bilateral DVT  Status post GI bleed which required reversal of anticoagulation, EGD without definitive source, previous colonoscopy only revealed diverticulosis.  Anemia multifactorial , Hemoglobin stable no recurrent bleeding  Clostridium difficile, on Flagyl DC'd Elevated CBGs, insulin has been added to TPN , If p.o. Diet tolerated, sliding-scale insulin and will be added as needed  .  Malnutrition continue TPN,Advance diet as tolerated  Decondition continue physical therapy . Physical therapy or recommendations noted in reference to inpatient rehabilitation    LOS: 30 days   Stephani Janak D 07/24/2011, 9:05 AM

## 2011-07-25 LAB — CBC
HCT: 26.5 % — ABNORMAL LOW (ref 39.0–52.0)
Hemoglobin: 8.6 g/dL — ABNORMAL LOW (ref 13.0–17.0)
MCHC: 32.5 g/dL (ref 30.0–36.0)
RBC: 3.06 MIL/uL — ABNORMAL LOW (ref 4.22–5.81)

## 2011-07-25 LAB — GLUCOSE, CAPILLARY
Glucose-Capillary: 146 mg/dL — ABNORMAL HIGH (ref 70–99)
Glucose-Capillary: 189 mg/dL — ABNORMAL HIGH (ref 70–99)

## 2011-07-25 LAB — PROTIME-INR: INR: 1.46 (ref 0.00–1.49)

## 2011-07-25 LAB — HEPARIN LEVEL (UNFRACTIONATED): Heparin Unfractionated: 0.43 IU/mL (ref 0.30–0.70)

## 2011-07-25 LAB — T3, FREE: T3, Free: 3.4 pg/mL (ref 2.3–4.2)

## 2011-07-25 MED ORDER — WARFARIN SODIUM 6 MG PO TABS
6.0000 mg | ORAL_TABLET | Freq: Once | ORAL | Status: AC
  Administered 2011-07-25: 6 mg via ORAL
  Filled 2011-07-25: qty 1

## 2011-07-25 MED ORDER — FAT EMULSION 20 % IV EMUL
240.0000 mL | INTRAVENOUS | Status: DC
  Filled 2011-07-25: qty 250

## 2011-07-25 MED ORDER — INSULIN REGULAR HUMAN 100 UNIT/ML IJ SOLN
INTRAVENOUS | Status: AC
  Administered 2011-07-25: 18:00:00 via INTRAVENOUS
  Filled 2011-07-25: qty 2000

## 2011-07-25 NOTE — Progress Notes (Signed)
Patient ID: [name], male   DOB: 21-Oct-1933, 78 y.o.   MRN: [id number] Subjective: Pt tol liquid diet Still tachy- no symptoms No abd pain BM x1  Objective: Vital signs in last 24 hours: Temp:  [97.5 F (36.4 C)-99.7 F (37.6 C)] 98.8 F (37.1 C) (03/23 0800) Pulse Rate:  [108-137] 130  (03/23 0906) Resp:  [18-27] 27  (03/23 0700) BP: (111-133)/(60-95) 133/95 mmHg (03/23 0906) SpO2:  [94 %-99 %] 97 % (03/23 0700)   Intake/Output from previous day: 03/22 0701 - 03/23 0700 In: 1455 [I.V.:180; TPN:1275] Out: 2241 [Urine:2240; Stool:1]    General appearance: alert Cardio: tachycardia GI: soft BS present, mild distended.  Lab Results:  Basename 07/25/11 0350 07/24/11 1000  WBC 7.2 7.2  HGB 8.6* 8.9*  HCT 26.5* 27.9*  PLT 379 389   BMET  Basename 07/23/11 0400  NA 132*  K 3.8  CL 104  CO2 23  GLUCOSE 195*  BUN 17  CREATININE 0.88  CALCIUM 8.0*   Lab Results  Component Value Date   ALT 9 07/23/2011   AST 17 07/23/2011   ALKPHOS 90 07/23/2011   BILITOT 0.2* 07/23/2011    Assessment/Plan:  Active Problems:  PE (pulmonary embolism) CONTINUE ANTICOAG  Chronic anticoagulation  Fall from standing  Fracture of hip, right, closed  SBO (small bowel obstruction) S/P LAB/ ILLEUS  IMPROVED LIQUID DIET/ TPN  Fecal impaction in rectum  Chronic constipation  Acute respiratory failure with hypoxia  Tachycardia ON B BLOCKER/ CARDIZEM TSH OK  A-fib FTT/ DECONDITIONING PT /OT WITH TOLERATE.   Jene Huq 07/25/2011, 9:50 AM

## 2011-07-25 NOTE — Progress Notes (Signed)
PARENTERAL NUTRITION and ANTICOAGULATION CONSULT NOTE - FOLLOW UP  Pharmacy Consult for TNA Indication: SBO  Pharmacy Consult for Heparin/Coumadin Indication: VTE, afib, and r/o CVA  No Known Allergies  Patient Measurements: Height: 5\' 6"  (167.6 cm) Weight: 156 lb 12 oz (71.1 kg) IBW/kg (Calculated) : 63.8  Weight on admit: 162 lb (73kg)  Vital Signs: Temp: 99.7 F (37.6 C) (03/23 0400) Temp src: Oral (03/23 0000) BP: 129/65 mmHg (03/23 0400) Pulse Rate: 125  (03/23 0700)  Intake/Output from previous day: 03/22 0701 - 03/23 0700 In: 1455 [I.V.:180; TPN:1275] Out: 2241 [Urine:2240; Stool:1]  Labs:  [facility] 07/25/11 0350 07/24/11 1000 07/24/11 0430 07/23/11 0400  WBC 7.2 7.2 -- 7.3  HGB 8.6* 8.9* -- 8.9*  HCT 26.5* 27.9* -- 27.2*  PLT 379 389 -- 393  APTT -- -- -- --  INR 1.46 -- 1.58* 1.67*     Basename 07/23/11 0400  NA 132*  K 3.8  CL 104  CO2 23  GLUCOSE 195*  BUN 17  CREATININE 0.88  LABCREA --  CREAT24HRUR --  CALCIUM 8.0*  MG 1.8  PHOS 2.9  PROT 6.5  ALBUMIN 1.7*  AST 17  ALT 9  ALKPHOS 90  BILITOT 0.2*  BILIDIR --  IBILI --  PREALBUMIN --  TRIG --  CHOLHDL --  CHOL --   Estimated Creatinine Clearance: 65.5 ml/min (by C-G formula based on Cr of 0.88).   IV Fluid:  None   Nutritional Goals:   1850-2110 kCal, 79-92 grams of protein per day  Clinimix E 5/20 12ml/hr + 20% Lipids 12ml/hr will provide 90gm protein, 1584 kcal on non-lipid days, 2064 kcal on lipid days, average 1790 kcal/week  Current Nutrition:  Clinimix E5/20 at 75 ml/hr +/- IV lipids at 4ml/hr on MWF. Full liquid started 3/21  CBGs: 200, 172 on current formula -> requiring 6 units SSI since last TNA bag hung.  Assessment:  14 YOM s/p ORIF 2/22, GI bleed 2/28 thought to be due to diverticular disease, CT 3/4 consistent with SBO.   POD#15 LOA, serosal/enterotomy repair, fecal disimpaction.  Tolerating TNA at goal rate of 75 ml/hr  Lytes:  No BMET since  3/20  Prealbumin: 13.6(3/18), next level 3/25  Lipids: have remained normal  CBGs: Currently has 88 units insulin/2L bag, insulin increased yesterday so will wait another day to see full effect prior to any adjustment.   Surgery noted 3/22 may want to calorie count to see if TNA can be tapered off soon.  Continue full liquids for now.  PO intake not recorded for 3/22  Plan:   Continue Clinimix E5/20 at goal rate of 75 ml/hr  Fat emulsion at 10 ml/hr, standard multivitamins and trace elements on MWF only due to ongoing shortage. Will obtain BMET for tomorrow  Let us know when we can start tapering TNA to help with appetite.  ___________________________________________________    ANTICOAGULATION CONSULT NOTE - Follow Up Consult  Labs:  Basename 07/25/11 0350 07/24/11 1000 07/24/11 0430 07/23/11 1330 07/23/11 0400  HGB 8.6* 8.9* -- -- --  HCT 26.5* 27.9* -- -- 27.2*  PLT 379 389 -- -- 393  APTT -- -- -- -- --  LABPROT 18.0* -- 19.2* -- 20.0*  INR 1.46 -- 1.58* -- 1.67*  HEPARINUNFRC 0.43 -- 0.53 0.68 --  CREATININE -- -- -- -- 0.88  CKTOTAL -- -- -- -- --  CKMB -- -- -- -- --  TROPONINI -- -- -- -- --   Estimated Creatinine Clearance: 65.5  ml/min (by C-G formula based on Cr of 0.88).   Assessment:  77 yom on chronic warfarin for history of PE, DVTs, s/p ORIF on 2/22, ex-lap with LOA, enterotomy 3/8, acute CVA 3/10.  Currently on heparin drip and Coumadin resumed 3/21.  Lower heparin level goal of 0.3 to 0.5 requested per MD.    Heparin currently running at 1050 units/hr and heparin level within goal.   Home Coumadin dose : 2.5mg  MWFSu, 5mg  other days.  INR further down today.  No bleeding reported/documented. CBC stable.  Goal of Therapy:  Heparin level 0.3-0.5   Plan:   Continue heparin at 1050 units/hr.    Coumadin 6 mg po x 1 tonight  Daily PT/INR and heparin level  Geoffry Paradise Thi 07/25/2011,7:29 AM

## 2011-07-25 NOTE — Progress Notes (Signed)
15 Days Post-Op  Subjective: Feels bloated.  Passing a lot of gas.  Bowels have been moving.  Objective: Vital signs in last 24 hours: Temp:  [97.5 F (36.4 C)-99.7 F (37.6 C)] 99.7 F (37.6 C) (03/23 0400) Pulse Rate:  [108-137] 125  (03/23 0700) Resp:  [17-27] 27  (03/23 0700) BP: (111-156)/(60-79) 129/65 mmHg (03/23 0400) SpO2:  [94 %-99 %] 97 % (03/23 0700) Last BM Date: 07/24/11  Intake/Output from previous day: 03/22 0701 - 03/23 0700 In: 1455 [I.V.:180; TPN:1275] Out: 2241 [Urine:2240; Stool:1] Intake/Output this shift:    PE: Abd-soft, distended, nontender, incisions clean and intact  Lab Results:   Basename 07/25/11 0350 07/24/11 1000  WBC 7.2 7.2  HGB 8.6* 8.9*  HCT 26.5* 27.9*  PLT 379 389   BMET  Basename 07/23/11 0400  NA 132*  K 3.8  CL 104  CO2 23  GLUCOSE 195*  BUN 17  CREATININE 0.88  CALCIUM 8.0*   PT/INR  Basename 07/25/11 0350 07/24/11 0430  LABPROT 18.0* 19.2*  INR 1.46 1.58*   Comprehensive Metabolic Panel:    Component Value Date/Time   NA 132* 07/23/2011 0400   K 3.8 07/23/2011 0400   CL 104 07/23/2011 0400   CO2 23 07/23/2011 0400   BUN 17 07/23/2011 0400   CREATININE 0.88 07/23/2011 0400   GLUCOSE 195* 07/23/2011 0400   CALCIUM 8.0* 07/23/2011 0400   AST 17 07/23/2011 0400   ALT 9 07/23/2011 0400   ALKPHOS 90 07/23/2011 0400   BILITOT 0.2* 07/23/2011 0400   PROT 6.5 07/23/2011 0400   ALBUMIN 1.7* 07/23/2011 0400     Studies/Results: Dg Chest Port 1 View  07/24/2011  *RADIOLOGY REPORT*  Clinical Data: Follow up pneumonia  PORTABLE CHEST - 1 VIEW  Comparison: 07/14/2011  Findings: Cardiomediastinal silhouette is stable.  Stable right arm PICC line position with tip in SVC.  NG tube has been removed.  No pulmonary edema.  There is improvement in aeration.  Residual right base medially linear atelectasis or infiltrate.  No new infiltrate is noted.  IMPRESSION:  NG tube has been removed.  No pulmonary edema.  There is improvement in  aeration.  Residual right base medially linear atelectasis or infiltrate.  No new infiltrate is noted.  Original Report Authenticated By: Natasha Mead, M.D.   Dg Hip Portable 1 View Right  07/23/2011  *RADIOLOGY REPORT*  Clinical Data: Status post ORIF for right hip fracture.  PORTABLE RIGHT HIP - 1 VIEW  Comparison: Intraoperative radiographs dated 06/26/2011.  Findings: Dynamic hip compression screw is again seen traversing a intertrochanteric fracture of the right hip, without evidence of hardware loosening or other hardware related complications. Surgical clips is seen inferior to the right hip joint.  Fracture fragments show early signs of healing and incorporation.  IMPRESSION: 1.  Postoperative changes of a dynamic compression screw fixation for intertrochanteric fracture of the right hip, without complicating features, as above.  Original Report Authenticated By: Florencia Reasons, M.D.    Anti-infectives: Anti-infectives     Start     Dose/Rate Route Frequency Ordered Stop   07/15/11 0900   vancomycin (VANCOCIN) IVPB 1000 mg/200 mL premix  Status:  Discontinued        1,000 mg 200 mL/hr over 60 Minutes Intravenous Every 12 hours 07/15/11 0407 07/16/11 1325   07/12/11 1800   metroNIDAZOLE (FLAGYL) IVPB 500 mg  Status:  Discontinued        500 mg 100 mL/hr over 60 Minutes Intravenous  4 times per day 07/12/11 1645 07/23/11 0930   07/12/11 1530   vancomycin (VANCOCIN) 500 mg in sodium chloride irrigation 0.9 % 100 mL enema        500 mg 66.7 mL/hr over 90 Minutes Rectal 4 times per day 07/12/11 1450 07/13/11 0108   07/11/11 2359   vancomycin (VANCOCIN) IVPB 1000 mg/200 mL premix  Status:  Discontinued        1,000 mg 200 mL/hr over 60 Minutes Intravenous Every 24 hours 07/11/11 0807 07/15/11 0407   07/10/11 1800   vancomycin (VANCOCIN) 500 mg in sodium chloride 0.9 % 100 mL IVPB  Status:  Discontinued        500 mg 100 mL/hr over 60 Minutes Intravenous Every 12 hours 07/10/11 1641  07/11/11 0806   07/10/11 1800   piperacillin-tazobactam (ZOSYN) IVPB 3.375 g  Status:  Discontinued        3.375 g 12.5 mL/hr over 240 Minutes Intravenous Every 8 hours 07/10/11 1708 07/15/11 0720   07/10/11 0800   ertapenem (INVANZ) 1 g in sodium chloride 0.9 % 50 mL IVPB  Status:  Discontinued        1 g 100 mL/hr over 30 Minutes Intravenous Every 24 hours 07/10/11 0739 07/10/11 1659   06/26/11 2000   ceFAZolin (ANCEF) IVPB 1 g/50 mL premix        1 g 100 mL/hr over 30 Minutes Intravenous Every 6 hours 06/26/11 1658 06/27/11 0857          Assessment Active Problems:  PE (pulmonary embolism)  Chronic anticoagulation  Fall from standing  Fracture of hip, right, closed  SBO (small bowel obstruction) s/p expl lap, LOA, repair of enterotomy on 3/8-had prolonged postop ileus which is slowly resolving; on liquid diet.  Fecal impaction in rectum  Chronic constipation  Acute respiratory failure with hypoxia  Tachycardia  A-fib    LOS: 31 days   Plan: Continue liquid diet for now.   Haizel Gatchell Shela Commons 07/25/2011

## 2011-07-25 NOTE — Progress Notes (Signed)
Imp.   1. Sinus Tach.  Multiple other medical problems.  No new recs.  Continue low dose metoprolol as tolerated. The sinus tach is secondary to his other illnesses.  Will sign off.   Call [facility] cardiology for questions.  Vesta Mixer, Montez Hageman., MD, [facility] 07/25/2011, 12:25 PM

## 2011-07-26 LAB — HEPARIN LEVEL (UNFRACTIONATED): Heparin Unfractionated: 0.54 IU/mL (ref 0.30–0.70)

## 2011-07-26 LAB — GLUCOSE, CAPILLARY
Glucose-Capillary: 164 mg/dL — ABNORMAL HIGH (ref 70–99)
Glucose-Capillary: 138 mg/dL — ABNORMAL HIGH (ref 70–99)

## 2011-07-26 LAB — BASIC METABOLIC PANEL
BUN: 21 mg/dL (ref 6–23)
CO2: 24 mEq/L (ref 19–32)
Calcium: 8.3 mg/dL — ABNORMAL LOW (ref 8.4–10.5)
GFR calc non Af Amer: 67 mL/min — ABNORMAL LOW (ref >90)
Glucose, Bld: 168 mg/dL — ABNORMAL HIGH (ref 70–99)
Potassium: 5.1 mEq/L (ref 3.5–5.1)
Sodium: 132 mEq/L — ABNORMAL LOW (ref 135–145)

## 2011-07-26 MED ORDER — METOPROLOL TARTRATE 50 MG PO TABS
50.0000 mg | ORAL_TABLET | Freq: Two times a day (BID) | ORAL | Status: DC
  Administered 2011-07-26 – 2011-08-02 (×16): 50 mg via ORAL
  Filled 2011-07-26 (×18): qty 1

## 2011-07-26 MED ORDER — WARFARIN SODIUM 6 MG PO TABS
6.0000 mg | ORAL_TABLET | Freq: Once | ORAL | Status: AC
  Administered 2011-07-26: 6 mg via ORAL
  Filled 2011-07-26: qty 1

## 2011-07-26 MED ORDER — POLYETHYLENE GLYCOL 3350 17 G PO PACK
17.0000 g | PACK | Freq: Every day | ORAL | Status: DC
  Administered 2011-07-26 – 2011-08-04 (×8): 17 g via ORAL
  Filled 2011-07-26 (×11): qty 1

## 2011-07-26 MED ORDER — INSULIN REGULAR HUMAN 100 UNIT/ML IJ SOLN
INTRAVENOUS | Status: AC
  Administered 2011-07-26: 18:00:00 via INTRAVENOUS
  Filled 2011-07-26: qty 2000

## 2011-07-26 MED ORDER — DOCUSATE SODIUM 100 MG PO CAPS
100.0000 mg | ORAL_CAPSULE | Freq: Two times a day (BID) | ORAL | Status: DC
  Administered 2011-07-26 – 2011-08-04 (×16): 100 mg via ORAL
  Filled 2011-07-26 (×22): qty 1

## 2011-07-26 NOTE — Progress Notes (Signed)
PARENTERAL NUTRITION and ANTICOAGULATION CONSULT NOTE - FOLLOW UP  Pharmacy Consult for TNA Indication: SBO  Pharmacy Consult for Heparin/Coumadin Indication: VTE, afib, and r/o CVA  No Known Allergies  Patient Measurements: Height: 5\' 6"  (167.6 cm) Weight: 156 lb 12 oz (71.1 kg) IBW/kg (Calculated) : 63.8  Weight on admit: 162 lb (73kg)  Vital Signs: Temp: 98.1 F (36.7 C) (03/24 0424) Temp src: Oral (03/24 0424) BP: 121/72 mmHg (03/24 0424) Pulse Rate: 107  (03/24 0424)  Intake/Output from previous day: 03/23 0701 - 03/24 0700 In: 4191.5 [P.O.:2300; I.V.:241.5; TPN:1650] Out: 3300 [Urine:3300]  Labs:  [facility] 07/26/11 0610 07/25/11 0350 07/24/11 1000 07/24/11 0430  WBC -- 7.2 7.2 --  HGB -- 8.6* 8.9* --  HCT -- 26.5* 27.9* --  PLT -- 379 389 --  APTT -- -- -- --  INR 1.44 1.46 -- 1.58*     Basename 07/26/11 0610  NA 132*  K 5.1  CL 103  CO2 24  GLUCOSE 168*  BUN 21  CREATININE 1.05  LABCREA --  CREAT24HRUR --  CALCIUM 8.3*  MG --  PHOS --  PROT --  ALBUMIN --  AST --  ALT --  ALKPHOS --  BILITOT --  BILIDIR --  IBILI --  PREALBUMIN --  TRIG --  CHOLHDL --  CHOL --   Estimated Creatinine Clearance: 54.9 ml/min (by C-G formula based on Cr of 1.05).   IV Fluid:  None   Nutritional Goals:   1850-2110 kCal, 79-92 grams of protein per day  Clinimix E 5/20 95ml/hr + 20% Lipids 45ml/hr will provide 90gm protein, 1584 kcal on non-lipid days, 2064 kcal on lipid days, average 1790 kcal/week  Current Nutrition:  Clinimix E5/20 at 75 ml/hr +/- IV lipids at 10ml/hr on MWF. Bland diet starting 3/24 AM  CBGs: 147, 146, 133, 189 on current formula -> requiring 5 units SSI since last TNA bag hung.  Assessment:  72 YOM s/p ORIF 2/22, GI bleed 2/28 thought to be due to diverticular disease, CT 3/4 consistent with SBO.   POD#16 LOA, serosal/enterotomy repair, fecal disimpaction.  Tolerating TNA at goal rate of 75 ml/hr  Lytes: Na slightly low,  K 5.1 (increased from 3.8 on 3/20  Prealbumin: 13.6(3/18), next level 3/25  Lipids: have remained normal  CBGs: Currently has 88 units insulin/2L bag, CBGs better  Surgery noted 3/22 may want to calorie count to see if TNA can be tapered off soon.  Advanced to bland diet today. PO intake 2300 ml recorded 3/23  Due to rapid rise in potassium without other source of K, will temporary remove electrolytes from TNA.  The only available Clinimix bag without electrolytes is the Clinimix 5/15.  Aware that this is NOT meeting pt's nutritional goal previously stated but PO intake increased 3/23 and this change is temporary.  Plan:  Change Clinimix formula to 5/15 at 75 ml/hr.  Will decrease insulin to 80 units per 2 L (total insulin per 24 hr will be 72 units) given less dextrose in this formulation.   Fat emulsion at 10 ml/hr, standard multivitamins and trace elements on MWF only due to ongoing shortage. TNA lab panel tomorrow, f/u diet toleration. Let us know when we can start tapering TNA to help with appetite.  ___________________________________________________    ANTICOAGULATION CONSULT NOTE - Follow Up Consult  Labs:  Basename 07/26/11 0610 07/25/11 0350 07/24/11 1000 07/24/11 0430  HGB -- 8.6* 8.9* --  HCT -- 26.5* 27.9* --  PLT -- 379  389 --  APTT -- -- -- --  LABPROT 17.8* 18.0* -- 19.2*  INR 1.44 1.46 -- 1.58*  HEPARINUNFRC 0.54 0.43 -- 0.53  CREATININE 1.05 -- -- --  CKTOTAL -- -- -- --  CKMB -- -- -- --  TROPONINI -- -- -- --   Estimated Creatinine Clearance: 54.9 ml/min (by C-G formula based on Cr of 1.05).   Assessment:  77 yom on chronic warfarin for history of PE, DVTs, s/p ORIF on 2/22, ex-lap with LOA, enterotomy 3/8, acute CVA 3/10.  Currently on heparin drip and Coumadin resumed 3/21.  Lower heparin level goal of 0.3 to 0.5 requested per MD.    Heparin currently running at 1050 units/hr and heparin level slightly above goal today.  Home Coumadin dose : 2.5mg   MWFSu, 5mg  other days.  No change in INR given higher than home dose x 2 days.  No bleeding reported/documented. No CBC this AM.   Goal of Therapy:  Heparin level 0.3-0.5   Plan:   Decrease heparin to 1000 units/hr and heparin level in AM.    Repeat Coumadin 6 mg po x 1 tonight  Daily PT/INR and heparin level  Geoffry Paradise Thi 07/26/2011,8:13 AM

## 2011-07-26 NOTE — Progress Notes (Signed)
Patient ID: [name], male   DOB: January 23, 1934, 78 y.o.   MRN: [id number] Subjective: Pt c/o constipation On liquid diet S/p  Bowel surg Adv diet. tachycardia  Objective: Vital signs in last 24 hours: Temp:  [97.8 F (36.6 C)-98.8 F (37.1 C)] 98.1 F (36.7 C) (03/24 0800) Pulse Rate:  [107-135] 126  (03/24 0800) Resp:  [19-27] 27  (03/24 0800) BP: (121-145)/(68-78) 121/78 mmHg (03/24 0800) SpO2:  [96 %-100 %] 98 % (03/24 0800)   Intake/Output from previous day: 03/23 0701 - 03/24 0700 In: 4191.5 [P.O.:2300; I.V.:241.5; TPN:1650] Out: 3300 [Urine:3300]    General appearance: alert Cardio: tachycardia GI: soft mild distended. BS present  Lab Results:  Basename 07/25/11 0350 07/24/11 1000  WBC 7.2 7.2  HGB 8.6* 8.9*  HCT 26.5* 27.9*  PLT 379 389   BMET  Basename 07/26/11 0610  NA 132*  K 5.1  CL 103  CO2 24  GLUCOSE 168*  BUN 21  CREATININE 1.05  CALCIUM 8.3*   Lab Results  Component Value Date   ALT 9 07/23/2011   AST 17 07/23/2011   ALKPHOS 90 07/23/2011   BILITOT 0.2* 07/23/2011    Assessment/Plan:  Active Problems:  PE (pulmonary embolism)  Chronic anticoagulation  Fall from standing  Fracture of hip, right, closed  SBO (small bowel obstruction)  Fecal impaction in rectum  Chronic constipation  Acute respiratory failure with hypoxia  Tachycardia  A-fib   [name] 07/26/2011, 10:15 AM

## 2011-07-26 NOTE — Progress Notes (Signed)
16 Days Post-Op  Subjective: Feels about the same.  Had a BM yesterday.  Passing gas.  Objective: Vital signs in last 24 hours: Temp:  [97.8 F (36.6 C)-98.8 F (37.1 C)] 98.1 F (36.7 C) (03/24 0424) Pulse Rate:  [107-135] 107  (03/24 0424) Resp:  [19-27] 19  (03/24 0424) BP: (121-145)/(68-95) 121/72 mmHg (03/24 0424) SpO2:  [96 %-100 %] 97 % (03/24 0424) Last BM Date: 07/25/11  Intake/Output from previous day: 03/23 0701 - 03/24 0700 In: 4020.5 [P.O.:2300; I.V.:220.5; TPN:1500] Out: 3300 [Urine:3300] Intake/Output this shift: Total I/O In: 3155 [P.O.:2300; I.V.:105; TPN:750] Out: 1375 [Urine:1375]  PE: Abd-soft, distended, nontender, incisions clean and intact  Lab Results:   Basename 07/25/11 0350 07/24/11 1000  WBC 7.2 7.2  HGB 8.6* 8.9*  HCT 26.5* 27.9*  PLT 379 389   BMET No results found for this basename: NA:2,K:2,CL:2,CO2:2,GLUCOSE:2,BUN:2,CREATININE:2,CALCIUM:2 in the last 72 hours PT/INR  Basename 07/25/11 0350 07/24/11 0430  LABPROT 18.0* 19.2*  INR 1.46 1.58*   Comprehensive Metabolic Panel:    Component Value Date/Time   NA 132* 07/23/2011 0400   K 3.8 07/23/2011 0400   CL 104 07/23/2011 0400   CO2 23 07/23/2011 0400   BUN 17 07/23/2011 0400   CREATININE 0.88 07/23/2011 0400   GLUCOSE 195* 07/23/2011 0400   CALCIUM 8.0* 07/23/2011 0400   AST 17 07/23/2011 0400   ALT 9 07/23/2011 0400   ALKPHOS 90 07/23/2011 0400   BILITOT 0.2* 07/23/2011 0400   PROT 6.5 07/23/2011 0400   ALBUMIN 1.7* 07/23/2011 0400     Studies/Results: Dg Chest Port 1 View  07/24/2011  *RADIOLOGY REPORT*  Clinical Data: Follow up pneumonia  PORTABLE CHEST - 1 VIEW  Comparison: 07/14/2011  Findings: Cardiomediastinal silhouette is stable.  Stable right arm PICC line position with tip in SVC.  NG tube has been removed.  No pulmonary edema.  There is improvement in aeration.  Residual right base medially linear atelectasis or infiltrate.  No new infiltrate is noted.  IMPRESSION:  NG  tube has been removed.  No pulmonary edema.  There is improvement in aeration.  Residual right base medially linear atelectasis or infiltrate.  No new infiltrate is noted.  Original Report Authenticated By: Natasha Mead, M.D.    Anti-infectives: Anti-infectives     Start     Dose/Rate Route Frequency Ordered Stop   07/15/11 0900   vancomycin (VANCOCIN) IVPB 1000 mg/200 mL premix  Status:  Discontinued        1,000 mg 200 mL/hr over 60 Minutes Intravenous Every 12 hours 07/15/11 0407 07/16/11 1325   07/12/11 1800   metroNIDAZOLE (FLAGYL) IVPB 500 mg  Status:  Discontinued        500 mg 100 mL/hr over 60 Minutes Intravenous 4 times per day 07/12/11 1645 07/23/11 0930   07/12/11 1530   vancomycin (VANCOCIN) 500 mg in sodium chloride irrigation 0.9 % 100 mL enema        500 mg 66.7 mL/hr over 90 Minutes Rectal 4 times per day 07/12/11 1450 07/13/11 0108   07/11/11 2359   vancomycin (VANCOCIN) IVPB 1000 mg/200 mL premix  Status:  Discontinued        1,000 mg 200 mL/hr over 60 Minutes Intravenous Every 24 hours 07/11/11 0807 07/15/11 0407   07/10/11 1800   vancomycin (VANCOCIN) 500 mg in sodium chloride 0.9 % 100 mL IVPB  Status:  Discontinued        500 mg 100 mL/hr over 60 Minutes Intravenous Every  12 hours 07/10/11 1641 07/11/11 0806   07/10/11 1800   piperacillin-tazobactam (ZOSYN) IVPB 3.375 g  Status:  Discontinued        3.375 g 12.5 mL/hr over 240 Minutes Intravenous Every 8 hours 07/10/11 1708 07/15/11 0720   07/10/11 0800   ertapenem (INVANZ) 1 g in sodium chloride 0.9 % 50 mL IVPB  Status:  Discontinued        1 g 100 mL/hr over 30 Minutes Intravenous Every 24 hours 07/10/11 0739 07/10/11 1659   06/26/11 2000   ceFAZolin (ANCEF) IVPB 1 g/50 mL premix        1 g 100 mL/hr over 30 Minutes Intravenous Every 6 hours 06/26/11 1658 06/27/11 0857          Assessment Active Problems:  PE (pulmonary embolism)  Chronic anticoagulation  Fall from standing  Fracture of hip,  right, closed  SBO (small bowel obstruction) s/p expl lap, LOA, repair of enterotomy on 3/8-had prolonged postop ileus which is slowly resolving; on liquid diet.  Fecal impaction in rectum  Chronic constipation  Acute respiratory failure with hypoxia  Tachycardia  A-fib    LOS: 32 days   Plan: Try bland diet today.   [name] 07/26/2011

## 2011-07-27 ENCOUNTER — Inpatient Hospital Stay (HOSPITAL_COMMUNITY): Payer: Medicare HMO

## 2011-07-27 LAB — CBC
Hemoglobin: 9.6 g/dL — ABNORMAL LOW (ref 13.0–17.0)
MCH: 27.7 pg (ref 26.0–34.0)
Platelets: 396 10*3/uL (ref 150–400)
RBC: 3.46 MIL/uL — ABNORMAL LOW (ref 4.22–5.81)
WBC: 8.1 10*3/uL (ref 4.0–10.5)

## 2011-07-27 LAB — DIFFERENTIAL
Basophils Relative: 0 % (ref 0–1)
Lymphocytes Relative: 21 % (ref 12–46)
Lymphs Abs: 1.7 10*3/uL (ref 0.7–4.0)
Monocytes Absolute: 0.7 10*3/uL (ref 0.1–1.0)
Monocytes Relative: 9 % (ref 3–12)
Neutro Abs: 5.5 10*3/uL (ref 1.7–7.7)
Neutrophils Relative %: 67 % (ref 43–77)

## 2011-07-27 LAB — COMPREHENSIVE METABOLIC PANEL
ALT: 14 U/L (ref 0–53)
AST: 24 U/L (ref 0–37)
Albumin: 1.9 g/dL — ABNORMAL LOW (ref 3.5–5.2)
Alkaline Phosphatase: 138 U/L — ABNORMAL HIGH (ref 39–117)
BUN: 25 mg/dL — ABNORMAL HIGH (ref 6–23)
CO2: 21 mEq/L (ref 19–32)
Calcium: 8.5 mg/dL (ref 8.4–10.5)
Chloride: 98 mEq/L (ref 96–112)
Creatinine, Ser: 1.02 mg/dL (ref 0.50–1.35)
GFR calc Af Amer: 81 mL/min — ABNORMAL LOW (ref >90)
GFR calc non Af Amer: 70 mL/min — ABNORMAL LOW (ref >90)
Glucose, Bld: 128 mg/dL — ABNORMAL HIGH (ref 70–99)
Potassium: 4.8 mEq/L (ref 3.5–5.1)
Sodium: 127 mEq/L — ABNORMAL LOW (ref 135–145)
Total Bilirubin: 0.3 mg/dL (ref 0.3–1.2)
Total Protein: 7.8 g/dL (ref 6.0–8.3)

## 2011-07-27 LAB — PROTIME-INR
INR: 1.66 — ABNORMAL HIGH (ref 0.00–1.49)
Prothrombin Time: 19.9 seconds — ABNORMAL HIGH (ref 11.6–15.2)

## 2011-07-27 LAB — HEPARIN LEVEL (UNFRACTIONATED): Heparin Unfractionated: 0.48 IU/mL (ref 0.30–0.70)

## 2011-07-27 LAB — GLUCOSE, CAPILLARY
Glucose-Capillary: 155 mg/dL — ABNORMAL HIGH (ref 70–99)
Glucose-Capillary: 120 mg/dL — ABNORMAL HIGH (ref 70–99)
Glucose-Capillary: 162 mg/dL — ABNORMAL HIGH (ref 70–99)

## 2011-07-27 LAB — MAGNESIUM: Magnesium: 1.8 mg/dL (ref 1.5–2.5)

## 2011-07-27 LAB — PREALBUMIN: Prealbumin: 21 mg/dL (ref 17.0–34.0)

## 2011-07-27 LAB — TRIGLYCERIDES: Triglycerides: 110 mg/dL (ref <150)

## 2011-07-27 MED ORDER — MORPHINE SULFATE 2 MG/ML IJ SOLN
1.0000 mg | INTRAMUSCULAR | Status: DC | PRN
  Administered 2011-07-28 – 2011-07-30 (×4): 1 mg via INTRAVENOUS
  Filled 2011-07-27 (×6): qty 1

## 2011-07-27 MED ORDER — TRACE MINERALS CR-CU-MN-SE-ZN 10-1000-500-60 MCG/ML IV SOLN
INTRAVENOUS | Status: AC
  Administered 2011-07-27: 17:00:00 via INTRAVENOUS
  Filled 2011-07-27: qty 2000

## 2011-07-27 MED ORDER — FAT EMULSION 20 % IV EMUL
240.0000 mL | INTRAVENOUS | Status: AC
  Administered 2011-07-27: 240 mL via INTRAVENOUS
  Filled 2011-07-27: qty 250

## 2011-07-27 MED ORDER — SODIUM CHLORIDE 0.9 % IV SOLN
INTRAVENOUS | Status: DC
  Administered 2011-07-28 – 2011-08-01 (×4): via INTRAVENOUS

## 2011-07-27 MED ORDER — WARFARIN SODIUM 6 MG PO TABS
6.0000 mg | ORAL_TABLET | Freq: Once | ORAL | Status: AC
  Administered 2011-07-27: 6 mg via ORAL
  Filled 2011-07-27: qty 1

## 2011-07-27 NOTE — Progress Notes (Signed)
Pt with abdominal pain occuring later after rounds. Plain films look like ileus with gas in small and large bowel. Will see how pt's abdominal exam is tomorrow.

## 2011-07-27 NOTE — Progress Notes (Signed)
PARENTERAL NUTRITION and ANTICOAGULATION CONSULT NOTE - FOLLOW UP  Pharmacy Consult for TNA Indication: SBO  Pharmacy Consult for Heparin/Coumadin Indication: VTE, afib, and r/o CVA  No Known Allergies  Patient Measurements: Height: 5\' 6"  (167.6 cm) Weight: 156 lb 12 oz (71.1 kg) IBW/kg (Calculated) : 63.8  Weight on admit: 162 lb (73kg)  Vital Signs: Temp: 97.9 F (36.6 C) (03/25 0426) Temp src: Oral (03/25 0426) BP: 155/89 mmHg (03/25 0426) Pulse Rate: 120  (03/25 0426)  Intake/Output from previous day: 03/24 0701 - 03/25 0700 In: 1710.9 [I.V.:210.9; TPN:1500] Out: 2101 [Urine:2100; Stool:1]  Labs:  [facility] 07/27/11 0335 07/26/11 0610 07/25/11 0350 07/24/11 1000  WBC 8.1 -- 7.2 7.2  HGB 9.6* -- 8.6* 8.9*  HCT 30.1* -- 26.5* 27.9*  PLT 396 -- 379 389  APTT -- -- -- --  INR 1.66* 1.44 1.46 --     Basename 07/27/11 0335 07/26/11 0610  NA 127* 132*  K 4.8 5.1  CL 98 103  CO2 21 24  GLUCOSE 128* 168*  BUN 25* 21  CREATININE 1.02 1.05  LABCREA -- --  CREAT24HRUR -- --  CALCIUM 8.5 8.3*  MG 1.8 --  PHOS 3.4 --  PROT 7.8 --  ALBUMIN 1.9* --  AST 24 --  ALT 14 --  ALKPHOS 138* --  BILITOT 0.3 --  BILIDIR -- --  IBILI -- --  PREALBUMIN -- --  TRIG 110 --  CHOLHDL -- --  CHOL 91 --   Estimated Creatinine Clearance: 56.5 ml/min (by C-G formula based on Cr of 1.02).   IV Fluid:  None   Nutritional Goals:   1850-2110 kCal, 79-92 grams of protein per day  Clinimix E 5/20 76ml/hr + 20% Lipids 76ml/hr will provide 90gm protein, 1584 kcal on non-lipid days, 2064 kcal on lipid days, average 1790 kcal/week  Current Nutrition:  Clinimix E5/20 at 75 ml/hr +/- IV lipids at 69ml/hr on MWF. Bland diet starting 3/24 AM  CBGs: 131, 138, 155 -> required 5 units SSI since last TNA bag hung.  Assessment:  26 YOM s/p ORIF 2/22, GI bleed 2/28 thought to be due to diverticular disease, CT 3/4 consistent with SBO.   POD#17 LOA, serosal/enterotomy repair, fecal  disimpaction.  Tolerating TNA at goal rate of 75 ml/hr  Lytes: Low Na  Prealbumin: 13.6 (3/18), pending 3/25 level   Lipids: wnl   CBGs: better controlled, mostly CBGs < 150.    Alkaline phosphatase rising (138 today, from 90 on 3/21)   Surgery noted 3/22 may want to calorie count to see if TNA can be tapered off soon.  Advanced to bland diet 3/24. PO intake 2300 ml recorded 3/23, not recored 3/24.    Due to rapid rise in potassium without other source of K on 3/24, TNA formulation was changed to Clinimix 5/15 without electrolytes and insulin reduced given lower dextrose in TNA.  K wnl this AM and Na lowered so will resume previous formulation to meet nutritional goals.   Plan:  Change Clinimix formula to E 5/20 at 75 ml/hr.  Increase insulin to 88 units/2L bag.   Fat emulsion at 10 ml/hr, standard multivitamins and trace elements on MWF only due to ongoing shortage. F/u diet toleration, lytes and alk phos. Let us know when we can start tapering TNA to help with appetite.  ___________________________________________________    ANTICOAGULATION CONSULT NOTE - Follow Up Consult  Labs:  Basename 07/27/11 0335 07/26/11 0610 07/25/11 0350 07/24/11 1000  HGB 9.6* -- 8.6* --  HCT 30.1* -- 26.5* 27.9*  PLT 396 -- 379 389  APTT -- -- -- --  LABPROT 19.9* 17.8* 18.0* --  INR 1.66* 1.44 1.46 --  HEPARINUNFRC 0.48 0.54 0.43 --  CREATININE 1.02 1.05 -- --  CKTOTAL -- -- -- --  CKMB -- -- -- --  TROPONINI -- -- -- --   Estimated Creatinine Clearance: 56.5 ml/min (by C-G formula based on Cr of 1.02).   Assessment:  77 yom on chronic warfarin for history of PE, DVTs, s/p ORIF on 2/22, ex-lap with LOA, enterotomy 3/8, acute CVA 3/10.  Currently on heparin drip and Coumadin resumed 3/21.  Lower heparin level goal of 0.3 to 0.5 requested per MD.    Heparin currently running at 1000 units/hr and heparin level at goal this AM.  Home Coumadin dose : 2.5mg  MWFSu, 5mg  other days.  INR  now increased to 1.66 after higher than home doses given.    No bleeding reported/documented.  Stable CBC.  Goal of Therapy:  Heparin level 0.3-0.5   Plan:   Continue heparin at 1000 units/hr     Repeat Coumadin 6 mg po x 1 tonight  Daily PT/INR and heparin level  Geoffry Paradise Thi 07/27/2011,9:14 AM

## 2011-07-27 NOTE — Progress Notes (Signed)
17 Days Post-Op  Subjective: On bed pan, says diarrhea is better, No complaints.   Objective: Vital signs in last 24 hours: Temp:  [97.7 F (36.5 C)-98.5 F (36.9 C)] 97.9 F (36.6 C) (03/25 0426) Pulse Rate:  [107-134] 120  (03/25 0921) Resp:  [20-26] 22  (03/25 0426) BP: (131-155)/(71-89) 144/88 mmHg (03/25 0921) SpO2:  [95 %-99 %] 95 % (03/25 0426) Last BM Date: 07/26/11  Intake/Output from previous day: 03/24 0701 - 03/25 0700 In: 1710.9 [I.V.:210.9; TPN:1500] Out: 2101 [Urine:2100; Stool:1] Intake/Output this shift: Total I/O In: 240 [P.O.:240] Out: -   General appearance: alert, cooperative and no distress Resp: clear to auscultation bilaterally GI: distended, not tender, +BS, +BM's, Incision looks fine. Ongoing weakness On left   Lab Results:   Basename 07/27/11 0335 07/25/11 0350  WBC 8.1 7.2  HGB 9.6* 8.6*  HCT 30.1* 26.5*  PLT 396 379    BMET  Basename 07/27/11 0335 07/26/11 0610  NA 127* 132*  K 4.8 5.1  CL 98 103  CO2 21 24  GLUCOSE 128* 168*  BUN 25* 21  CREATININE 1.02 1.05  CALCIUM 8.5 8.3*   PT/INR  Basename 07/27/11 0335 07/26/11 0610  LABPROT 19.9* 17.8*  INR 1.66* 1.44     Lab 07/27/11 0335 07/23/11 0400  AST 24 17  ALT 14 9  ALKPHOS 138* 90  BILITOT 0.3 0.2*  PROT 7.8 6.5  ALBUMIN 1.9* 1.7*     Lipase     Component Value Date/Time   LIPASE 52 07/04/2011 0919     Studies/Results: Dg Abd Portable 1v  07/27/2011  *RADIOLOGY REPORT*  Clinical Data: History of small-bowel obstruction.  Abdominal pain and distention.  PORTABLE ABDOMEN - 1 VIEW  Comparison: 07/22/2011.  Findings: Rectal gas is present.  There is moderate gaseous distention of loops of small and large intestine diffusely. Previous abdominal ventral surgery changes are seen.  Previous dynamic compression screw has been placed in the right proximal femur.  Scoliosis.  Changes of degenerative disc disease degenerative spondylosis.  Surgical clips in right inguinal  region. No opaque calculi.  Pelvic phleboliths.  IMPRESSION: Rectal gas is present.  There is moderate gaseous distention of loops of small and large intestine diffusely. This could be functional or reflect an ileus.  Air fluid levels cannot be evaluated without horizontal beam examination.  Original Report Authenticated By: Crawford Givens, M.D.    Medications:    . antiseptic oral rinse  15 mL Mouth Rinse q12n4p  . Boost Plus  237 mL Oral Q1400  . chlorhexidine  15 mL Mouth Rinse BID  . diltiazem  180 mg Oral Daily  . docusate sodium  100 mg Oral BID  . insulin aspart  0-15 Units Subcutaneous TID AC & HS  . lip balm  1 application Topical BID  . metoprolol tartrate  50 mg Oral BID  . pantoprazole  40 mg Oral Daily  . polyethylene glycol  17 g Oral Daily  . sodium chloride  10-40 mL Intracatheter Q12H  . warfarin  6 mg Oral ONCE-1800  . warfarin  6 mg Oral ONCE-1800  . Warfarin - Pharmacist Dosing Inpatient   Does not apply q1800    Assessment/Plan PE (pulmonary embolism)  Chronic anticoagulation  Fall from standing  Fracture of hip, right, closed  SBO (small bowel obstruction) s/p expl lap, LOA, repair of enterotomy on 3/8-had prolonged postop ileus which is slowly resolving; on liquid diet.  Fecal impaction in rectum  Chronic constipation  Acute respiratory failure with hypoxia  Tachycardia  A-fib    Plan:  From a surgical standpoint he is stable.      LOS: 33 days    Boyce Keltner 07/27/2011

## 2011-07-27 NOTE — Progress Notes (Signed)
Physical Therapy Treatment Patient Details Name: [name] MRN: [id number] DOB: 02-22-1934 Today's Date: 07/27/2011  1. S/P R ORIF 2nd fall/fx WBAT 2. SBO w/ exp lap 3. CVA w/ L hemiparesis 14:00 - 14:25 2 ta  PT Assessment/Plan  PT - Assessment/Plan Comments on Treatment Session: Assisted pt to EOB to perform trunk stability activities and WB thru L UE however pt tolerated very little 2nd MAX c/o dizzyness.  BP 134/82.  EOB activities of 4 min then squat pivot to recliner. PT Plan: Discharge plan remains appropriate Follow Up Recommendations: Skilled nursing facility Equipment Recommended: Defer to next venue PT Goals  Acute Rehab PT Goals PT Goal Formulation: With patient Pt will Roll Supine to Right Side: with mod assist PT Goal: Rolling Supine to Right Side - Progress: Progressing toward goal Pt will Roll Supine to Left Side: with mod assist PT Goal: Rolling Supine to Left Side - Progress: Progressing toward goal Pt will go Supine/Side to Sit: with mod assist PT Goal: Supine/Side to Sit - Progress: Progressing toward goal Pt will Sit at [facility] of Bed: 6-10 min;with unilateral upper extremity support;with min assist PT Goal: Sit at Edge Of Bed - Progress: Progressing toward goal Pt will go Sit to Stand: with +2 total assist;Other (comment) (pt 50%) PT Goal: Sit to Stand - Progress: Progressing toward goal Pt will go Stand to Sit: with +2 total assist (Pt 50%) PT Goal: Stand to Sit - Progress: Progressing toward goal Pt will Transfer Bed to Chair/Chair to Bed: with +2 total assist (pt 50%) PT Transfer Goal: Bed to Chair/Chair to Bed - Progress: Progressing toward goal  PT Treatment Precautions/Restrictions  Precautions Precautions: Fall Precaution Comments: s/p ORIF R hip - no percautions - WBAT - CVA w/ L emiparesis Required Braces or Orthoses: No Restrictions Weight Bearing Restrictions: No RLE Weight Bearing: Weight bearing as tolerated Other Position/Activity  Restrictions: Pt aware he is WBAT Mobility (including Balance) Bed Mobility Bed Mobility: Yes Rolling Right: 1: +2 Total assist Rolling Right Details (indicate cue type and reason): Total Assist + 2 pt 10% using bed pad to swival pt to EOB. Supine to Sit: 1: +2 Total assist Supine to Sit Details (indicate cue type and reason): Total Assist + 2 pt 10% using bed pad to swival hips around and tactile cueing to increase posture to upright and midline. Sitting - Scoot to Edge of Bed: 1: +2 Total assist Sitting - Scoot to Edge of Bed Details (indicate cue type and reason): Total Assist + 2 pt 10% using bed pad to swival hips around and tactile cueing to increase posture to upright and midline. Transfers Transfers: Yes Sit to Stand: 1: +2 Total assist;From bed;From elevated surface Sit to Stand Details (indicate cue type and reason): squat pivot 1/4 turn to pt's R from bed to chair pt 10%. Stand to Sit: 1: +2 Total assist;To chair/3-in-1 Squat Pivot Transfer Details (indicate cue type and reason): squat pivot 1/4 turn to pt's R from bed to chair pt 10%. Ambulation/Gait Ambulation/Gait: No Wheelchair Mobility Wheelchair Mobility: No    Exercise  EOB sitting static/dynamic activities X 4 min PNF pattern cross midline reaching using R UE Posture facilitation corrections and core training WB thru L elbow activities End of Session PT - End of Session Equipment Utilized During Treatment: Gait belt Activity Tolerance: Patient limited by fatigue;Other (comment) (MAX c/o dizzyness while sitting EOB) Patient left: in chair;Other (comment) (X 5 pillows for positioning and comfort) General Behavior During Session: [facility] for tasks  performed Cognition: [facility] for tasks performed  Felecia Shelling  PTA [facility]  Acute  Rehab Pager     [phone]

## 2011-07-27 NOTE — Progress Notes (Signed)
Subjective: Patient complains of abdominal pain when he coughs. Otherwise he's tolerating his diet. Denies chest pain denies shortness of breath. No significant events over the weekend  Objective: Vital signs in last 24 hours: Temp:  [97.7 F (36.5 C)-98.5 F (36.9 C)] 97.9 F (36.6 C) (03/25 0426) Pulse Rate:  [107-134] 120  (03/25 0426) Resp:  [20-26] 22  (03/25 0426) BP: (131-155)/(71-89) 155/89 mmHg (03/25 0426) SpO2:  [95 %-99 %] 95 % (03/25 0426) Weight change:  Last BM Date: 07/26/11  Intake/Output from previous day: 03/24 0701 - 03/25 0700 In: 1710.9 [I.V.:210.9; TPN:1500] Out: 2101 [Urine:2100; Stool:1] Intake/Output this shift:    General appearance: occasional coughing spells. Resp: clear to auscultation bilaterally Cardio: sinus tach GI: distended, positive bowel sounds Extremities: extremities normal, atraumatic, no cyanosis or edema  Lab Results:  Results for orders placed during the hospital encounter of 06/24/11 (from the past 24 hour(s))  GLUCOSE, CAPILLARY     Status: Abnormal   Collection Time   07/26/11 12:04 PM      Component Value Range   Glucose-Capillary 192 (*) 70 - 99 (mg/dL)   Comment 1 Notify RN     Comment 2 Documented in Chart    GLUCOSE, CAPILLARY     Status: Abnormal   Collection Time   07/26/11  4:23 PM      Component Value Range   Glucose-Capillary 131 (*) 70 - 99 (mg/dL)  GLUCOSE, CAPILLARY     Status: Abnormal   Collection Time   07/26/11 10:07 PM      Component Value Range   Glucose-Capillary 138 (*) 70 - 99 (mg/dL)   Comment 1 Notify RN     Comment 2 Documented in Chart    COMPREHENSIVE METABOLIC PANEL     Status: Abnormal   Collection Time   07/27/11  3:35 AM      Component Value Range   Sodium 127 (*) 135 - 145 (mEq/L)   Potassium 4.8  3.5 - 5.1 (mEq/L)   Chloride 98  96 - 112 (mEq/L)   CO2 21  19 - 32 (mEq/L)   Glucose, Bld 128 (*) 70 - 99 (mg/dL)   BUN 25 (*) 6 - 23 (mg/dL)   Creatinine, Ser 1.61  0.50 - 1.35 (mg/dL)     Calcium 8.5  8.4 - 10.5 (mg/dL)   Total Protein 7.8  6.0 - 8.3 (g/dL)   Albumin 1.9 (*) 3.5 - 5.2 (g/dL)   AST 24  0 - 37 (U/L)   ALT 14  0 - 53 (U/L)   Alkaline Phosphatase 138 (*) 39 - 117 (U/L)   Total Bilirubin 0.3  0.3 - 1.2 (mg/dL)   GFR calc non Af Amer 70 (*) >90 (mL/min)   GFR calc Af Amer 81 (*) >90 (mL/min)  MAGNESIUM     Status: Normal   Collection Time   07/27/11  3:35 AM      Component Value Range   Magnesium 1.8  1.5 - 2.5 (mg/dL)  PHOSPHORUS     Status: Normal   Collection Time   07/27/11  3:35 AM      Component Value Range   Phosphorus 3.4  2.3 - 4.6 (mg/dL)  DIFFERENTIAL     Status: Normal   Collection Time   07/27/11  3:35 AM      Component Value Range   Neutrophils Relative 67  43 - 77 (%)   Neutro Abs 5.5  1.7 - 7.7 (K/uL)   Lymphocytes Relative 21  12 - 46 (%)   Lymphs Abs 1.7  0.7 - 4.0 (K/uL)   Monocytes Relative 9  3 - 12 (%)   Monocytes Absolute 0.7  0.1 - 1.0 (K/uL)   Eosinophils Relative 3  0 - 5 (%)   Eosinophils Absolute 0.2  0.0 - 0.7 (K/uL)   Basophils Relative 0  0 - 1 (%)   Basophils Absolute 0.0  0.0 - 0.1 (K/uL)  CHOLESTEROL, TOTAL     Status: Normal   Collection Time   07/27/11  3:35 AM      Component Value Range   Cholesterol 91  0 - 200 (mg/dL)  TRIGLYCERIDES     Status: Normal   Collection Time   07/27/11  3:35 AM      Component Value Range   Triglycerides 110  <150 (mg/dL)  HEPARIN LEVEL (UNFRACTIONATED)     Status: Normal   Collection Time   07/27/11  3:35 AM      Component Value Range   Heparin Unfractionated 0.48  0.30 - 0.70 (IU/mL)  CBC     Status: Abnormal   Collection Time   07/27/11  3:35 AM      Component Value Range   WBC 8.1  4.0 - 10.5 (K/uL)   RBC 3.46 (*) 4.22 - 5.81 (MIL/uL)   Hemoglobin 9.6 (*) 13.0 - 17.0 (g/dL)   HCT 16.1 (*) 09.6 - 52.0 (%)   MCV 87.0  78.0 - 100.0 (fL)   MCH 27.7  26.0 - 34.0 (pg)   MCHC 31.9  30.0 - 36.0 (g/dL)   RDW 04.5 (*) 40.9 - 15.5 (%)   Platelets 396  150 - 400 (K/uL)   PROTIME-INR     Status: Abnormal   Collection Time   07/27/11  3:35 AM      Component Value Range   Prothrombin Time 19.9 (*) 11.6 - 15.2 (seconds)   INR 1.66 (*) 0.00 - 1.49       Studies/Results: No results found.  Medications:  Scheduled:   . antiseptic oral rinse  15 mL Mouth Rinse q12n4p  . Boost Plus  237 mL Oral Q1400  . chlorhexidine  15 mL Mouth Rinse BID  . diltiazem  180 mg Oral Daily  . docusate sodium  100 mg Oral BID  . insulin aspart  0-15 Units Subcutaneous TID AC & HS  . lip balm  1 application Topical BID  . metoprolol tartrate  50 mg Oral BID  . pantoprazole  40 mg Oral Daily  . polyethylene glycol  17 g Oral Daily  . sodium chloride  10-40 mL Intracatheter Q12H  . warfarin  6 mg Oral ONCE-1800  . Warfarin - Pharmacist Dosing Inpatient   Does not apply q1800  . DISCONTD: metoprolol tartrate  25 mg Oral BID   Continuous:   . heparin 1,000 Units/hr (07/27/11 0300)  . TPN (CLINIMIX) +/- additives 75 mL/hr at 07/26/11 1749  . TPN (CLINIMIX) +/- additives 75 mL/hr at 07/25/11 1734    Assessment/Plan: CVA with left hemiparesis, continue heparin continue recommendations per neurology. 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 Recent ultrasound shows chronic bilateral DVT  Status post GI bleed which required reversal of anticoagulation, EGD without definitive source, previous colonoscopy only revealed diverticulosis.  Anemia multifactorial , Hemoglobin stable no recurrent bleeding  Clostridium difficile resolved  Malnutrition continue TPN,Advance diet as tolerated  Decondition continue physical therapy . LTAC recommended   LOS: 33 days   Kohen Reither D 07/27/2011, 8:55 AM

## 2011-07-27 NOTE — Progress Notes (Signed)
CSW assisting with d/c planning. According to [facility] notes LTAC is unable to accept pt. FL2 updated and [facility] contacted. SNF is able to offer placement when pt is stable. Prior approval is needed from [facility] which CSW will initiate when MD indicates pt is nearing d/c. Daughter Jasmine December ) contacted and she is agreeable with d/c plan to [facility] once pt is stable. CSW will follow to assist with d/c planning.

## 2011-07-27 NOTE — Progress Notes (Deleted)
17 Days Post-Op  Subjective: Sitting up in bed alert, no complaints  Objective: Vital signs in last 24 hours: Temp:  [97.7 F (36.5 C)-98.5 F (36.9 C)] 97.9 F (36.6 C) (03/25 0426) Pulse Rate:  [107-134] 120  (03/25 0921) Resp:  [20-26] 22  (03/25 0426) BP: (131-155)/(71-89) 144/88 mmHg (03/25 0921) SpO2:  [95 %-99 %] 95 % (03/25 0426) Last BM Date: 07/26/11  Intake/Output from previous day: 03/24 0701 - 03/25 0700 In: 1710.9 [I.V.:210.9; TPN:1500] Out: 2101 [Urine:2100; Stool:1] Intake/Output this shift: Total I/O In: 240 [P.O.:240] Out: -   General appearance: alert, cooperative and no distress GI: abd is distended, no discomfort, +BS, incision looks good  Lab Results:   [facility] 07/27/11 0335 07/25/11 0350  WBC 8.1 7.2  HGB 9.6* 8.6*  HCT 30.1* 26.5*  PLT 396 379    BMET  Basename 07/27/11 0335 07/26/11 0610  NA 127* 132*  K 4.8 5.1  CL 98 103  CO2 21 24  GLUCOSE 128* 168*  BUN 25* 21  CREATININE 1.02 1.05  CALCIUM 8.5 8.3*   PT/INR  Basename 07/27/11 0335 07/26/11 0610  LABPROT 19.9* 17.8*  INR 1.66* 1.44     Lab 07/27/11 0335 07/23/11 0400  AST 24 17  ALT 14 9  ALKPHOS 138* 90  BILITOT 0.3 0.2*  PROT 7.8 6.5  ALBUMIN 1.9* 1.7*     Lipase     Component Value Date/Time   LIPASE 52 07/04/2011 0919     Studies/Results: No results found.  Medications:    . antiseptic oral rinse  15 mL Mouth Rinse q12n4p  . Boost Plus  237 mL Oral Q1400  . chlorhexidine  15 mL Mouth Rinse BID  . diltiazem  180 mg Oral Daily  . docusate sodium  100 mg Oral BID  . insulin aspart  0-15 Units Subcutaneous TID AC & HS  . lip balm  1 application Topical BID  . metoprolol tartrate  50 mg Oral BID  . pantoprazole  40 mg Oral Daily  . polyethylene glycol  17 g Oral Daily  . sodium chloride  10-40 mL Intracatheter Q12H  . warfarin  6 mg Oral ONCE-1800  . warfarin  6 mg Oral ONCE-1800  . Warfarin - Pharmacist Dosing Inpatient   Does not apply q1800     Assessment/Plan PE (pulmonary embolism)  Chronic anticoagulation  Fall from standing  Fracture of hip, right, closed  SBO (small bowel obstruction) s/p expl lap, LOA, repair of enterotomy on 3/8-had prolonged postop ileus which is slowly resolving; on liquid diet.  Fecal impaction in rectum  Chronic constipation  Acute respiratory failure with hypoxia  Tachycardia  A-fib         LOS: 33 days    Cassity Christian 07/27/2011

## 2011-07-28 ENCOUNTER — Inpatient Hospital Stay (HOSPITAL_COMMUNITY): Payer: Medicare HMO

## 2011-07-28 LAB — BASIC METABOLIC PANEL
CO2: 19 mEq/L (ref 19–32)
Calcium: 8.5 mg/dL (ref 8.4–10.5)
Creatinine, Ser: 1.03 mg/dL (ref 0.50–1.35)
GFR calc non Af Amer: 69 mL/min — ABNORMAL LOW (ref >90)

## 2011-07-28 LAB — PROTIME-INR
INR: 1.8 — ABNORMAL HIGH (ref 0.00–1.49)
Prothrombin Time: 21.2 seconds — ABNORMAL HIGH (ref 11.6–15.2)

## 2011-07-28 LAB — GLUCOSE, CAPILLARY
Glucose-Capillary: 161 mg/dL — ABNORMAL HIGH (ref 70–99)
Glucose-Capillary: 146 mg/dL — ABNORMAL HIGH (ref 70–99)
Glucose-Capillary: 160 mg/dL — ABNORMAL HIGH (ref 70–99)

## 2011-07-28 LAB — HEPARIN LEVEL (UNFRACTIONATED): Heparin Unfractionated: 0.37 IU/mL (ref 0.30–0.70)

## 2011-07-28 MED ORDER — INSULIN REGULAR HUMAN 100 UNIT/ML IJ SOLN
INTRAMUSCULAR | Status: AC
  Administered 2011-07-28: 17:00:00 via INTRAVENOUS
  Filled 2011-07-28: qty 2000

## 2011-07-28 MED ORDER — WARFARIN SODIUM 6 MG PO TABS
6.0000 mg | ORAL_TABLET | Freq: Once | ORAL | Status: AC
  Administered 2011-07-28: 6 mg via ORAL
  Filled 2011-07-28: qty 1

## 2011-07-28 MED ORDER — METOCLOPRAMIDE HCL 5 MG/ML IJ SOLN
5.0000 mg | Freq: Four times a day (QID) | INTRAMUSCULAR | Status: DC
  Administered 2011-07-28 – 2011-08-02 (×19): 5 mg via INTRAVENOUS
  Filled 2011-07-28 (×13): qty 1
  Filled 2011-07-28: qty 2
  Filled 2011-07-28: qty 1
  Filled 2011-07-28: qty 2
  Filled 2011-07-28 (×6): qty 1

## 2011-07-28 NOTE — Progress Notes (Signed)
Subjective: Patient complains of some abdominal discomfort, there is no emesis. He has not moved his bowels. He states he feels like he needs an enema. Followup x-ray shows ileus, no comments of small bowel obstruction on today's exam. Patient appears tired and fatigue. Followup labs show improvement in sodium  Objective: Vital signs in last 24 hours: Temp:  [98 F (36.7 C)-100.7 F (38.2 C)] 98.1 F (36.7 C) (03/26 0513) Pulse Rate:  [122-132] 130  (03/26 0900) Resp:  [18-20] 18  (03/26 0513) BP: (128-156)/(77-90) 128/77 mmHg (03/26 0900) SpO2:  [95 %-96 %] 95 % (03/26 0513) Weight change:  Last BM Date: 07/26/11  Intake/Output from previous day: 03/25 0701 - 03/26 0700 In: 2535 [P.O.:360; I.V.:40; TPN:2135] Out: 3200 [Urine:3200] Intake/Output this shift:    General appearance: alert, cooperative and fatigued Resp: clear to auscultation bilaterally Cardio: regular rate and rhythm, S1, S2 normal, no murmur, click, rub or gallop GI: slight protuberant, tympanic  Lab Results:  Results for orders placed during the hospital encounter of 06/24/11 (from the past 24 hour(s))  GLUCOSE, CAPILLARY     Status: Abnormal   Collection Time   07/27/11  4:34 PM      Component Value Range   Glucose-Capillary 162 (*) 70 - 99 (mg/dL)  GLUCOSE, CAPILLARY     Status: Abnormal   Collection Time   07/27/11  9:37 PM      Component Value Range   Glucose-Capillary 140 (*) 70 - 99 (mg/dL)   Comment 1 Notify RN     Comment 2 Documented in Chart    HEPARIN LEVEL (UNFRACTIONATED)     Status: Normal   Collection Time   07/28/11  5:15 AM      Component Value Range   Heparin Unfractionated 0.37  0.30 - 0.70 (IU/mL)  PROTIME-INR     Status: Abnormal   Collection Time   07/28/11  5:15 AM      Component Value Range   Prothrombin Time 21.2 (*) 11.6 - 15.2 (seconds)   INR 1.80 (*) 0.00 - 1.49   BASIC METABOLIC PANEL     Status: Abnormal   Collection Time   07/28/11  5:15 AM      Component Value Range     Sodium 128 (*) 135 - 145 (mEq/L)   Potassium 4.3  3.5 - 5.1 (mEq/L)   Chloride 101  96 - 112 (mEq/L)   CO2 19  19 - 32 (mEq/L)   Glucose, Bld 160 (*) 70 - 99 (mg/dL)   BUN 27 (*) 6 - 23 (mg/dL)   Creatinine, Ser 4.40  0.50 - 1.35 (mg/dL)   Calcium 8.5  8.4 - 10.2 (mg/dL)   GFR calc non Af Amer 69 (*) >90 (mL/min)   GFR calc Af Amer 80 (*) >90 (mL/min)  GLUCOSE, CAPILLARY     Status: Abnormal   Collection Time   07/28/11  8:31 AM      Component Value Range   Glucose-Capillary 161 (*) 70 - 99 (mg/dL)      Studies/Results: Dg Chest Port 1 View  07/28/2011  *RADIOLOGY REPORT*  Clinical Data: Cough, pneumonia.  PORTABLE CHEST - 1 VIEW  Comparison: 07/24/2011  Findings: Bibasilar densities, compatible with atelectasis or infiltrates, right greater than left.  Biapical pleural thickening/scarring, stable.  Right PICC line is unchanged.  Heart is normal size.  No effusions.  IMPRESSION: Bibasilar atelectasis or infiltrates, slightly increased since prior study.  Chronic changes.  Original Report Authenticated By: Cyndie Chime, M.D.  Dg Abd Portable 1v  07/28/2011  *RADIOLOGY REPORT*  Clinical Data: Abdominal pain, ileus.  PORTABLE ABDOMEN - 1 VIEW  Comparison: 07/27/2011  Findings: Mild gaseous distention of bowel, predominately colon. No real change since prior study.  This likely reflects mild ileus. No free air organomegaly.  IMPRESSION: Stable mild gaseous distention of bowel, likely mild ileus.  Original Report Authenticated By: Cyndie Chime, M.D.   Dg Abd Portable 1v  07/27/2011  *RADIOLOGY REPORT*  Clinical Data: History of small-bowel obstruction.  Abdominal pain and distention.  PORTABLE ABDOMEN - 1 VIEW  Comparison: 07/22/2011.  Findings: Rectal gas is present.  There is moderate gaseous distention of loops of small and large intestine diffusely. Previous abdominal ventral surgery changes are seen.  Previous dynamic compression screw has been placed in the right proximal femur.   Scoliosis.  Changes of degenerative disc disease degenerative spondylosis.  Surgical clips in right inguinal region. No opaque calculi.  Pelvic phleboliths.  IMPRESSION: Rectal gas is present.  There is moderate gaseous distention of loops of small and large intestine diffusely. This could be functional or reflect an ileus.  Air fluid levels cannot be evaluated without horizontal beam examination.  Original Report Authenticated By: Crawford Givens, M.D.    Medications:  Scheduled:   . antiseptic oral rinse  15 mL Mouth Rinse q12n4p  . lactose free nutrition  237 mL Oral Q1400  . chlorhexidine  15 mL Mouth Rinse BID  . diltiazem  180 mg Oral Daily  . docusate sodium  100 mg Oral BID  . insulin aspart  0-15 Units Subcutaneous TID AC & HS  . lip balm  1 application Topical BID  . metoprolol tartrate  50 mg Oral BID  . pantoprazole  40 mg Oral Daily  . polyethylene glycol  17 g Oral Daily  . sodium chloride  10-40 mL Intracatheter Q12H  . warfarin  6 mg Oral ONCE-1800  . warfarin  6 mg Oral ONCE-1800  . Warfarin - Pharmacist Dosing Inpatient   Does not apply q1800   Continuous:   . sodium chloride 75 mL/hr at 07/27/11 1230  . fat emulsion 240 mL (07/27/11 1725)  . heparin 1,000 Units/hr (07/28/11 0030)  . TPN (CLINIMIX) +/- additives 75 mL/hr at 07/26/11 1749  . TPN (CLINIMIX) +/- additives    . TPN (CLINIMIX) +/- additives 75 mL/hr at 07/27/11 1724    Assessment/Plan: Persistent ileus,mobilize, add Reglan, await further input from surgery. Clear liquids for now Hyponatremia improved CVA with left hemiparesis History of PE History of GI bleed Anemia multifactorial Malnutrition on TPN Decondition, her case management note LTAC not possible  LOS: 34 days   [name] D 07/28/2011, 11:58 AM

## 2011-07-28 NOTE — Progress Notes (Signed)
PARENTERAL NUTRITION and ANTICOAGULATION CONSULT NOTE - FOLLOW UP  Pharmacy Consult for TNA Indication: SBO  Pharmacy Consult for Heparin/Coumadin Indication: VTE, afib, and r/o CVA  No Known Allergies  Patient Measurements: Height: 5\' 6"  (167.6 cm) Weight: 156 lb 12 oz (71.1 kg) IBW/kg (Calculated) : 63.8  Weight on admit: 162 lb (73kg)  Vital Signs: Temp: 98.1 F (36.7 C) (03/26 0513) Temp src: Oral (03/26 0513) BP: 143/77 mmHg (03/26 0513) Pulse Rate: 132  (03/26 0513)  Intake/Output from previous day: 03/25 0701 - 03/26 0700 In: 2535 [P.O.:360; I.V.:40; TPN:2135] Out: 3200 [Urine:3200]  Labs:  Basename 07/28/11 0515 07/27/11 0335 07/26/11 0610  WBC -- 8.1 --  HGB -- 9.6* --  HCT -- 30.1* --  PLT -- 396 --  APTT -- -- --  INR 1.80* 1.66* 1.44     Basename 07/28/11 0515 07/27/11 0335 07/26/11 0610  NA 128* 127* 132*  K 4.3 4.8 5.1  CL 101 98 103  CO2 19 21 24   GLUCOSE 160* 128* 168*  BUN 27* 25* 21  CREATININE 1.03 1.02 1.05  LABCREA -- -- --  CREAT24HRUR -- -- --  CALCIUM 8.5 8.5 8.3*  MG -- 1.8 --  PHOS -- 3.4 --  PROT -- 7.8 --  ALBUMIN -- 1.9* --  AST -- 24 --  ALT -- 14 --  ALKPHOS -- 138* --  BILITOT -- 0.3 --  BILIDIR -- -- --  IBILI -- -- --  PREALBUMIN -- 21.0 --  TRIG -- 110 --  CHOLHDL -- -- --  CHOL -- 91 --   Estimated Creatinine Clearance: 55.9 ml/min (by C-G formula based on Cr of 1.03).   IV Fluid:  None   Nutritional Goals:   1850-2110 kCal, 79-92 grams of protein per day  Clinimix E 5/20 3ml/hr + 20% Lipids 65ml/hr will provide 90gm protein, 1584 kcal on non-lipid days, 2064 kcal on lipid days, average 1790 kcal/week  Current Nutrition:  Clinimix E 5/20 at 75 ml/hr +/- IV lipids at 19ml/hr on MWF. NPO again, starting 3/25  CBGs: 120, 162, 140 -> required 2 units SSI since last TNA bag hung.  Assessment:  42 YOM s/p ORIF 2/22, GI bleed 2/28 thought to be due to diverticular disease, CT 3/4 consistent with SBO.     POD#18 LOA, serosal/enterotomy repair, fecal disimpaction.  Tolerating TNA at goal rate of 75 ml/hr  Lytes: Low Na  Prealbumin: 13.6 (3/18), 21 (3/25)  Lipids: wnl   CBGs: improved after adjusting insulin in TNA to 88 units/2L bag, mostly CBGs < 150  Alkaline phosphatase rising (138 3/25, from 90 on 3/21).  Recheck level on Thursday.  Patient was switched back to NPO yesterday after patient c/o abdominal pains and plain films identified a possible ileus.  Plan:  Continue Clinimix formula to E 5/20 at 75 ml/hr. Fat emulsion at 10 ml/hr, standard multivitamins and trace elements on MWF only due to ongoing shortage. F/u plan for advancing diet. Let us know when we can start tapering TNA to help with appetite.  ___________________________________________________    ANTICOAGULATION CONSULT NOTE - Follow Up Consult  Labs:  Basename 07/28/11 0515 07/27/11 0335 07/26/11 0610  HGB -- 9.6* --  HCT -- 30.1* --  PLT -- 396 --  APTT -- -- --  LABPROT 21.2* 19.9* 17.8*  INR 1.80* 1.66* 1.44  HEPARINUNFRC 0.37 0.48 0.54  CREATININE 1.03 1.02 1.05  CKTOTAL -- -- --  CKMB -- -- --  TROPONINI -- -- --  Estimated Creatinine Clearance: 55.9 ml/min (by C-G formula based on Cr of 1.03).   Assessment:  77 yom on chronic warfarin for history of PE, DVTs, s/p ORIF on 2/22, ex-lap with LOA, enterotomy 3/8, acute CVA 3/10.  Currently on heparin drip and Coumadin resumed 3/21.  Lower heparin level goal of 0.3 to 0.5 requested per MD.    Heparin currently running at 1000 units/hr and heparin level at goal this AM.  Home Coumadin dose : 2.5mg  MWFSu, 5mg  other days.  INR approaching goal range with 6 mg doses.  No bleeding reported/documented.  Stable CBC.  Goal of Therapy:  Heparin level 0.3-0.5   Plan:   Continue heparin at 1000 units/hr     Repeat Coumadin 6 mg po x 1 tonight  Daily PT/INR and heparin level  Cira Deyoe 07/28/2011,7:39 AM

## 2011-07-28 NOTE — Progress Notes (Signed)
No evidence radiographically of obstruction.  Small and large bowel dilated with rectal gas present.  Pt appears to have ileus. Would not advance past clears until having more BMs and/or flatus.

## 2011-07-28 NOTE — Progress Notes (Signed)
18 Days Post-Op  Subjective: No BM + flatus, not really complaining of pain  Objective: Vital signs in last 24 hours: Temp:  [98 F (36.7 C)-100.7 F (38.2 C)] 98.1 F (36.7 C) (03/26 0513) Pulse Rate:  [122-132] 130  (03/26 0900) Resp:  [18-20] 18  (03/26 0513) BP: (128-156)/(77-90) 128/77 mmHg (03/26 0900) SpO2:  [95 %-96 %] 95 % (03/26 0513) Last BM Date: 07/26/11  Intake/Output from previous day: 03/25 0701 - 03/26 0700 In: 2535 [P.O.:360; I.V.:40; TPN:2135] Out: 3200 [Urine:3200] Intake/Output this shift:    General appearance: alert, cooperative and no distress GI: distended not tender, +BS, +flatus  Lab Results:   [facility] 07/27/11 0335  WBC 8.1  HGB 9.6*  HCT 30.1*  PLT 396    BMET  Basename 07/28/11 0515 07/27/11 0335  NA 128* 127*  K 4.3 4.8  CL 101 98  CO2 19 21  GLUCOSE 160* 128*  BUN 27* 25*  CREATININE 1.03 1.02  CALCIUM 8.5 8.5   PT/INR  Basename 07/28/11 0515 07/27/11 0335  LABPROT 21.2* 19.9*  INR 1.80* 1.66*     Lab 07/27/11 0335 07/23/11 0400  AST 24 17  ALT 14 9  ALKPHOS 138* 90  BILITOT 0.3 0.2*  PROT 7.8 6.5  ALBUMIN 1.9* 1.7*     Lipase     Component Value Date/Time   LIPASE 52 07/04/2011 0919     Studies/Results: Dg Chest Port 1 View  07/28/2011  *RADIOLOGY REPORT*  Clinical Data: Cough, pneumonia.  PORTABLE CHEST - 1 VIEW  Comparison: 07/24/2011  Findings: Bibasilar densities, compatible with atelectasis or infiltrates, right greater than left.  Biapical pleural thickening/scarring, stable.  Right PICC line is unchanged.  Heart is normal size.  No effusions.  IMPRESSION: Bibasilar atelectasis or infiltrates, slightly increased since prior study.  Chronic changes.  Original Report Authenticated By: Cyndie Chime, M.D.   Dg Abd Portable 1v  07/28/2011  *RADIOLOGY REPORT*  Clinical Data: Abdominal pain, ileus.  PORTABLE ABDOMEN - 1 VIEW  Comparison: 07/27/2011  Findings: Mild gaseous distention of bowel, predominately  colon. No real change since prior study.  This likely reflects mild ileus. No free air organomegaly.  IMPRESSION: Stable mild gaseous distention of bowel, likely mild ileus.  Original Report Authenticated By: Cyndie Chime, M.D.   Dg Abd Portable 1v  07/27/2011  *RADIOLOGY REPORT*  Clinical Data: History of small-bowel obstruction.  Abdominal pain and distention.  PORTABLE ABDOMEN - 1 VIEW  Comparison: 07/22/2011.  Findings: Rectal gas is present.  There is moderate gaseous distention of loops of small and large intestine diffusely. Previous abdominal ventral surgery changes are seen.  Previous dynamic compression screw has been placed in the right proximal femur.  Scoliosis.  Changes of degenerative disc disease degenerative spondylosis.  Surgical clips in right inguinal region. No opaque calculi.  Pelvic phleboliths.  IMPRESSION: Rectal gas is present.  There is moderate gaseous distention of loops of small and large intestine diffusely. This could be functional or reflect an ileus.  Air fluid levels cannot be evaluated without horizontal beam examination.  Original Report Authenticated By: Crawford Givens, M.D.    Medications:    . antiseptic oral rinse  15 mL Mouth Rinse q12n4p  . lactose free nutrition  237 mL Oral Q1400  . chlorhexidine  15 mL Mouth Rinse BID  . diltiazem  180 mg Oral Daily  . docusate sodium  100 mg Oral BID  . insulin aspart  0-15 Units Subcutaneous TID AC &  HS  . lip balm  1 application Topical BID  . metoprolol tartrate  50 mg Oral BID  . pantoprazole  40 mg Oral Daily  . polyethylene glycol  17 g Oral Daily  . sodium chloride  10-40 mL Intracatheter Q12H  . warfarin  6 mg Oral ONCE-1800  . warfarin  6 mg Oral ONCE-1800  . Warfarin - Pharmacist Dosing Inpatient   Does not apply q1800    Assessment/Plan SBO (small bowel obstruction) s/p expl lap, LOA, repair of enterotomy on 3/8-had prolonged postop ileus which is slowly resolving; on liquid diet.  PE (pulmonary  embolism)  Chronic anticoagulation  Fall from standing  Fracture of hip, right, closed  Fecal impaction in rectum  Chronic constipation  Acute respiratory failure with hypoxia  Tachycardia  A-fib   IMP:  Ongoing ileus, we would not advance diet.      LOS: 34 days    Zawadi Aplin 07/28/2011

## 2011-07-29 ENCOUNTER — Encounter (HOSPITAL_COMMUNITY): Payer: Self-pay | Admitting: Surgery

## 2011-07-29 LAB — CATECHOLAMINES, FRACTIONATED, PLASMA: Norepinephrine: 990 pg/mL

## 2011-07-29 LAB — BASIC METABOLIC PANEL
CO2: 20 mEq/L (ref 19–32)
Calcium: 8.4 mg/dL (ref 8.4–10.5)
Creatinine, Ser: 1.04 mg/dL (ref 0.50–1.35)
GFR calc Af Amer: 79 mL/min — ABNORMAL LOW (ref >90)
GFR calc non Af Amer: 68 mL/min — ABNORMAL LOW (ref >90)
Sodium: 133 mEq/L — ABNORMAL LOW (ref 135–145)

## 2011-07-29 LAB — GLUCOSE, CAPILLARY
Glucose-Capillary: 151 mg/dL — ABNORMAL HIGH (ref 70–99)
Glucose-Capillary: 145 mg/dL — ABNORMAL HIGH (ref 70–99)
Glucose-Capillary: 133 mg/dL — ABNORMAL HIGH (ref 70–99)

## 2011-07-29 LAB — PROTIME-INR: Prothrombin Time: 23.7 seconds — ABNORMAL HIGH (ref 11.6–15.2)

## 2011-07-29 LAB — CBC
MCV: 87.8 fL (ref 78.0–100.0)
Platelets: 285 10*3/uL (ref 150–400)
RBC: 2.87 MIL/uL — ABNORMAL LOW (ref 4.22–5.81)
WBC: 7.6 10*3/uL (ref 4.0–10.5)

## 2011-07-29 MED ORDER — WARFARIN SODIUM 5 MG PO TABS
5.0000 mg | ORAL_TABLET | Freq: Once | ORAL | Status: AC
  Administered 2011-07-29: 5 mg via ORAL
  Filled 2011-07-29: qty 1

## 2011-07-29 MED ORDER — TRACE MINERALS CR-CU-MN-SE-ZN 10-1000-500-60 MCG/ML IV SOLN
INTRAVENOUS | Status: AC
  Administered 2011-07-29: 18:00:00 via INTRAVENOUS
  Filled 2011-07-29: qty 2000

## 2011-07-29 MED ORDER — FAT EMULSION 20 % IV EMUL
240.0000 mL | INTRAVENOUS | Status: AC
  Administered 2011-07-29: 240 mL via INTRAVENOUS
  Filled 2011-07-29: qty 250

## 2011-07-29 NOTE — Progress Notes (Signed)
Physical Therapy Treatment Patient Details Name: [name] MRN: [id number] DOB: 1934-04-04 Today's Date: 07/29/2011  PT Assessment/Plan  PT - Assessment/Plan Comments on Treatment Session: Pt performed exercises in bed with assist then assisted to recliner.  Pt continues to have L hemiparesis and unable to take weight through L side without support.  PT Plan: Discharge plan remains appropriate;Frequency remains appropriate Follow Up Recommendations: Skilled nursing facility Equipment Recommended: Defer to next venue PT Goals  Acute Rehab PT Goals Pt will go Supine/Side to Sit: with mod assist PT Goal: Supine/Side to Sit - Progress: Not progressing Pt will Sit at [facility] of Bed: 6-10 min;with min assist;with unilateral upper extremity support;with cues (comment type and amount) PT Goal: Sit at Edge Of Bed - Progress: Not progressing Pt will Perform Home Exercise Program: with min assist PT Goal: Perform Home Exercise Program - Progress: Progressing toward goal  PT Treatment Precautions/Restrictions  Precautions Precautions: Fall Precaution Comments: s/p ORIF R hip - no percautions - WBAT - CVA w/ L emiparesis Required Braces or Orthoses: No Restrictions Weight Bearing Restrictions: No RLE Weight Bearing: Weight bearing as tolerated Other Position/Activity Restrictions: Pt aware he is WBAT Mobility (including Balance) Bed Mobility Bed Mobility: Yes Supine to Sit: Patient percentage (comment);1: +2 Total assist;HOB elevated (Comment degrees) Supine to Sit Details (indicate cue type and reason): pt<10%, pt able to assist very little, used bed pad to assist with transfer Sitting - Scoot to Edge of Bed: Patient percentage (comment);1: +2 Total assist Sitting - Scoot to Edge of Bed Details (indicate cue type and reason): pt<10%, used bed pad to assist with transfer Transfers Transfers: Yes Sit to Stand: 1: +2 Total assist;Patient percentage (comment);From elevated surface;From bed Sit  to Stand Details (indicate cue type and reason): used bed pad to assist with transfer Stand to Sit: To chair/3-in-1;1: +2 Total assist;Patient percentage (comment) Stand to Sit Details: pt<10% Stand Pivot Transfers: 1: +2 Total assist;Patient percentage (comment) Stand Pivot Transfer Details (indicate cue type and reason): pt<10% Squat Pivot Transfers: 1: +2 Total assist;Patient percentage (comment) Squat Pivot Transfer Details (indicate cue type and reason): used bed pad to assist with transfer to pt's R side, supported L UE, no WBing present through L LE  Balance Balance Assessed: Yes Static Sitting Balance Static Sitting - Balance Support: Right upper extremity supported;Feet supported Static Sitting - Level of Assistance: 1: +1 Total assist Static Sitting - Comment/# of Minutes: Pt leans to R side requiring total assist to remain upright with verbal cues to attempt to engage abdominal muscles to assist.  sat EOB approx 3 min prior to squat pivot Exercise  General Exercises - Lower Extremity Ankle Circles/Pumps: AROM;Both;10 reps;Supine Quad Sets: AROM;Strengthening;Both;10 reps;Supine Heel Slides: AAROM;PROM;Both;10 reps;Supine;Other (comment) (passive on L) Hip ABduction/ADduction: AAROM;Strengthening;Both;10 reps;Supine End of Session PT - End of Session Equipment Utilized During Treatment: Gait belt Activity Tolerance: Patient limited by fatigue Patient left: in chair;with call bell in reach;Other (comment) (positioned to comfort and supported and elevated L UE) General Behavior During Session: [facility] for tasks performed Cognition: [facility] for tasks performed  Monroe Toure,[name] 07/29/2011, 11:28 AM Pager: [phone]

## 2011-07-29 NOTE — Progress Notes (Signed)
Subjective: Patient states he feels better, no nausea no vomiting. Tolerating p.o. Had a large bowel movement yesterday. He looks much better today  Objective: Vital signs in last 24 hours: Temp:  [97.9 F (36.6 C)-98.5 F (36.9 C)] 98.5 F (36.9 C) (03/27 0629) Pulse Rate:  [123-134] 132  (03/27 0955) Resp:  [18] 18  (03/27 0629) BP: (104-144)/(68-80) 144/80 mmHg (03/27 0955) SpO2:  [94 %-97 %] 94 % (03/27 0629) Weight change:  Last BM Date: 07/28/11  Intake/Output from previous day: 03/26 0701 - 03/27 0700 In: 902 [IV Piggyback:2; TPN:900] Out: 3300 [Urine:3300] Intake/Output this shift:    General appearance: alert and cooperative Resp: clear to auscultation bilaterally Cardio: regular rate and rhythm, S1, S2 normal, no murmur, click, rub or gallop GI: slight distended, positive bowel sounds, no organomegaly Neurologic: Motor: left hemiparesis lower extremity greater than the upper  Lab Results:  Results for orders placed during the hospital encounter of 06/24/11 (from the past 24 hour(s))  GLUCOSE, CAPILLARY     Status: Abnormal   Collection Time   07/28/11  4:59 PM      Component Value Range   Glucose-Capillary 146 (*) 70 - 99 (mg/dL)  GLUCOSE, CAPILLARY     Status: Abnormal   Collection Time   07/28/11  9:52 PM      Component Value Range   Glucose-Capillary 160 (*) 70 - 99 (mg/dL)   Comment 1 Documented in Chart     Comment 2 Notify RN    BASIC METABOLIC PANEL     Status: Abnormal   Collection Time   07/29/11  5:00 AM      Component Value Range   Sodium 133 (*) 135 - 145 (mEq/L)   Potassium 4.5  3.5 - 5.1 (mEq/L)   Chloride 104  96 - 112 (mEq/L)   CO2 20  19 - 32 (mEq/L)   Glucose, Bld 145 (*) 70 - 99 (mg/dL)   BUN 25 (*) 6 - 23 (mg/dL)   Creatinine, Ser 1.19  0.50 - 1.35 (mg/dL)   Calcium 8.4  8.4 - 14.7 (mg/dL)   GFR calc non Af Amer 68 (*) >90 (mL/min)   GFR calc Af Amer 79 (*) >90 (mL/min)  HEPARIN LEVEL (UNFRACTIONATED)     Status: Abnormal   Collection Time   07/29/11  5:04 AM      Component Value Range   Heparin Unfractionated 0.17 (*) 0.30 - 0.70 (IU/mL)  CBC     Status: Abnormal   Collection Time   07/29/11  5:04 AM      Component Value Range   WBC 7.6  4.0 - 10.5 (K/uL)   RBC 2.87 (*) 4.22 - 5.81 (MIL/uL)   Hemoglobin 8.0 (*) 13.0 - 17.0 (g/dL)   HCT 82.9 (*) 56.2 - 52.0 (%)   MCV 87.8  78.0 - 100.0 (fL)   MCH 27.9  26.0 - 34.0 (pg)   MCHC 31.7  30.0 - 36.0 (g/dL)   RDW 13.0 (*) 86.5 - 15.5 (%)   Platelets 285  150 - 400 (K/uL)  PROTIME-INR     Status: Abnormal   Collection Time   07/29/11  5:04 AM      Component Value Range   Prothrombin Time 23.7 (*) 11.6 - 15.2 (seconds)   INR 2.07 (*) 0.00 - 1.49   GLUCOSE, CAPILLARY     Status: Abnormal   Collection Time   07/29/11  7:50 AM      Component Value Range   Glucose-Capillary  151 (*) 70 - 99 (mg/dL)      Studies/Results: Dg Chest Port 1 View  07/28/2011  *RADIOLOGY REPORT*  Clinical Data: Cough, pneumonia.  PORTABLE CHEST - 1 VIEW  Comparison: 07/24/2011  Findings: Bibasilar densities, compatible with atelectasis or infiltrates, right greater than left.  Biapical pleural thickening/scarring, stable.  Right PICC line is unchanged.  Heart is normal size.  No effusions.  IMPRESSION: Bibasilar atelectasis or infiltrates, slightly increased since prior study.  Chronic changes.  Original Report Authenticated By: Cyndie Chime, M.D.   Dg Abd Portable 1v  07/28/2011  *RADIOLOGY REPORT*  Clinical Data: Abdominal pain, ileus.  PORTABLE ABDOMEN - 1 VIEW  Comparison: 07/27/2011  Findings: Mild gaseous distention of bowel, predominately colon. No real change since prior study.  This likely reflects mild ileus. No free air organomegaly.  IMPRESSION: Stable mild gaseous distention of bowel, likely mild ileus.  Original Report Authenticated By: Cyndie Chime, M.D.    Medications:  Scheduled:   . antiseptic oral rinse  15 mL Mouth Rinse q12n4p  . lactose free nutrition  237  mL Oral Q1400  . chlorhexidine  15 mL Mouth Rinse BID  . diltiazem  180 mg Oral Daily  . docusate sodium  100 mg Oral BID  . insulin aspart  0-15 Units Subcutaneous TID AC & HS  . lip balm  1 application Topical BID  . metoCLOPramide (REGLAN) injection  5 mg Intravenous Q6H  . metoprolol tartrate  50 mg Oral BID  . pantoprazole  40 mg Oral Daily  . polyethylene glycol  17 g Oral Daily  . sodium chloride  10-40 mL Intracatheter Q12H  . warfarin  5 mg Oral ONCE-1800  . warfarin  6 mg Oral ONCE-1800  . Warfarin - Pharmacist Dosing Inpatient   Does not apply q1800   Continuous:   . sodium chloride 75 mL/hr at 07/28/11 2219  . fat emulsion 240 mL (07/27/11 1725)  . fat emulsion    . TPN (CLINIMIX) +/- additives 75 mL/hr at 07/28/11 1722  . TPN (CLINIMIX) +/- additives 75 mL/hr at 07/27/11 1724  . TPN (CLINIMIX) +/- additives    . DISCONTD: heparin 1,000 Units/hr (07/29/11 0311)    Assessment/Plan: Ileus, clinically improved with, continue current treatment. Surgery input appreciated CVA with left hemiparesis. INR therapeutic, DC heparin Tachycardia improved Anemia multifactorial, we'll try to keep hemoglobin greater than 8 Hyponatremia improved Malnutrition continue TPN, continue p.o. Intake History of PE Treated Clostridium difficile, currently with solid bowel movement without fever chills  LOS: 35 days   Aryssa Rosamond D 07/29/2011, 12:35 PM

## 2011-07-29 NOTE — Progress Notes (Addendum)
PARENTERAL NUTRITION and ANTICOAGULATION CONSULT NOTE - FOLLOW UP  Pharmacy Consult for TNA Indication: SBO  Pharmacy Consult for Heparin/Coumadin Indication: VTE, afib, and r/o CVA  No Known Allergies  Patient Measurements: Height: 5\' 6"  (167.6 cm) Weight: 156 lb 12 oz (71.1 kg) IBW/kg (Calculated) : 63.8  Weight on admit: 162 lb (73kg)  Vital Signs: Temp: 98.5 F (36.9 C) (03/27 0629) Temp src: Oral (03/27 0629) BP: 119/69 mmHg (03/27 0629) Pulse Rate: 123  (03/27 0629)  Intake/Output from previous day: 03/26 0701 - 03/27 0700 In: 902 [IV Piggyback:2; TPN:900] Out: 3300 [Urine:3300]  Labs:  [facility] 07/29/11 0504 07/28/11 0515 07/27/11 0335  WBC 7.6 -- 8.1  HGB 8.0* -- 9.6*  HCT 25.2* -- 30.1*  PLT 285 -- 396  APTT -- -- --  INR 2.07* 1.80* 1.66*     Basename 07/28/11 0515 07/27/11 0335  NA 128* 127*  K 4.3 4.8  CL 101 98  CO2 19 21  GLUCOSE 160* 128*  BUN 27* 25*  CREATININE 1.03 1.02  LABCREA -- --  CREAT24HRUR -- --  CALCIUM 8.5 8.5  MG -- 1.8  PHOS -- 3.4  PROT -- 7.8  ALBUMIN -- 1.9*  AST -- 24  ALT -- 14  ALKPHOS -- 138*  BILITOT -- 0.3  BILIDIR -- --  IBILI -- --  PREALBUMIN -- 21.0  TRIG -- 110  CHOLHDL -- --  CHOL -- 91   Estimated Creatinine Clearance: 55.9 ml/min (by C-G formula based on Cr of 1.03).   IV Fluid:  NS @ 75 ml/hr  Nutritional Goals:   1850-2110 kCal, 79-92 grams of protein per day  Clinimix E 5/20 41ml/hr + 20% Lipids 55ml/hr will provide 90gm protein, 1584 kcal on non-lipid days, 2064 kcal on lipid days, average 1790 kcal/week  Current Nutrition:  Clinimix E 5/20 at 75 ml/hr +/- IV lipids at 13ml/hr on MWF. FLD  CBGs: 161, 176, 146, 160 -> required 11 units SSI over last 24 hours  Assessment:  77 YOM s/p ORIF 2/22, GI bleed 2/28 thought to be due to diverticular disease, CT 3/4 consistent with SBO.   POD#19 LOA, serosal/enterotomy repair, fecal disimpaction.  Tolerating TNA at goal rate of 75  ml/hr  Lytes: Low Na  Prealbumin: 13.6 (3/18), 21 (3/25)  Lipids: wnl   CBGs: not controlled, most CBGs are >150 with 88 units/2L TNA bag.  Will increase insulin by 8 units in new TNA bag.  Alkaline phosphatase rising (138 3/25, from 90 on 3/21).  Recheck level on Thursday.  Patient c/o abdominal pains and plain films identified an ileus.  Surgery recommending not to advance diet past clears until having more BMs or flatus.  Plan:  Continue Clinimix formula to E 5/20 at 75 ml/hr. Increase insulin to 96 units/2L TNA bag. Fat emulsion at 10 ml/hr, standard multivitamins and trace elements on MWF only due to ongoing shortage. F/u TNA lab panel in AM.  ___________________________________________________    ANTICOAGULATION CONSULT NOTE - Follow Up Consult  Labs:  Basename 07/29/11 0504 07/28/11 0515 07/27/11 0335  HGB 8.0* -- 9.6*  HCT 25.2* -- 30.1*  PLT 285 -- 396  APTT -- -- --  LABPROT 23.7* 21.2* 19.9*  INR 2.07* 1.80* 1.66*  HEPARINUNFRC 0.17* 0.37 0.48  CREATININE -- 1.03 1.02  CKTOTAL -- -- --  CKMB -- -- --  TROPONINI -- -- --   Estimated Creatinine Clearance: 55.9 ml/min (by C-G formula based on Cr of 1.03).   Assessment:  77 yom on chronic warfarin for history of PE, DVTs, s/p ORIF on 2/22, ex-lap with LOA, enterotomy 3/8, acute CVA 3/9.  Currently on heparin drip and Coumadin resumed 3/21.    Heparin currently running at 1000 units/hr which has been therapeutic but heparin level below goal this AM.  Home Coumadin dose : 2.5mg  MWFSu, 5mg  other days.  INR now within goal range following 6 mg doses.  No bleeding reported/documented.  H/H low but stable.   Goal of Therapy:  Heparin level 0.3-0.5 INR 2-3   Plan:   After discussing with Dr. Nehemiah Settle, discontinuing heparin gtt now that INR is therapeutic.  Coumadin 5 mg po x 1 tonight  Daily PT/INR.  [name] 07/29/2011,7:48 AM

## 2011-07-29 NOTE — Progress Notes (Signed)
19 Days Post-Op  Subjective: Up in chair.  Abd still distended after drinking Boost.  + flatus, +BM, some substance.  Objective: Vital signs in last 24 hours: Temp:  [97.9 F (36.6 C)-98.5 F (36.9 C)] 98.5 F (36.9 C) (03/27 0629) Pulse Rate:  [123-134] 132  (03/27 0955) Resp:  [18] 18  (03/27 0629) BP: (104-144)/(68-80) 144/80 mmHg (03/27 0955) SpO2:  [94 %-97 %] 94 % (03/27 0629) Last BM Date: 07/28/11  Intake/Output from previous day: 03/26 0701 - 03/27 0700 In: 902 [IV Piggyback:2; TPN:900] Out: 3300 [Urine:3300] Intake/Output this shift:    General appearance: alert, cooperative and no distress GI: up in chair, distended. + Bs,+BM, +flatus, wound from surgery healed and doing welll.  Lab Results:   [name] 07/29/11 0504 07/27/11 0335  WBC 7.6 8.1  HGB 8.0* 9.6*  HCT 25.2* 30.1*  PLT 285 396    BMET  Basename 07/29/11 0500 07/28/11 0515  NA 133* 128*  K 4.5 4.3  CL 104 101  CO2 20 19  GLUCOSE 145* 160*  BUN 25* 27*  CREATININE 1.04 1.03  CALCIUM 8.4 8.5   PT/INR  Basename 07/29/11 0504 07/28/11 0515  LABPROT 23.7* 21.2*  INR 2.07* 1.80*     Lab 07/27/11 0335 07/23/11 0400  AST 24 17  ALT 14 9  ALKPHOS 138* 90  BILITOT 0.3 0.2*  PROT 7.8 6.5  ALBUMIN 1.9* 1.7*     Lipase     Component Value Date/Time   LIPASE 52 07/04/2011 0919     Studies/Results: Dg Chest Port 1 View  07/28/2011  *RADIOLOGY REPORT*  Clinical Data: Cough, pneumonia.  PORTABLE CHEST - 1 VIEW  Comparison: 07/24/2011  Findings: Bibasilar densities, compatible with atelectasis or infiltrates, right greater than left.  Biapical pleural thickening/scarring, stable.  Right PICC line is unchanged.  Heart is normal size.  No effusions.  IMPRESSION: Bibasilar atelectasis or infiltrates, slightly increased since prior study.  Chronic changes.  Original Report Authenticated By: Cyndie Chime, M.D.   Dg Abd Portable 1v  07/28/2011  *RADIOLOGY REPORT*  Clinical Data: Abdominal pain,  ileus.  PORTABLE ABDOMEN - 1 VIEW  Comparison: 07/27/2011  Findings: Mild gaseous distention of bowel, predominately colon. No real change since prior study.  This likely reflects mild ileus. No free air organomegaly.  IMPRESSION: Stable mild gaseous distention of bowel, likely mild ileus.  Original Report Authenticated By: Cyndie Chime, M.D.    Medications:    . antiseptic oral rinse  15 mL Mouth Rinse q12n4p  . lactose free nutrition  237 mL Oral Q1400  . chlorhexidine  15 mL Mouth Rinse BID  . diltiazem  180 mg Oral Daily  . docusate sodium  100 mg Oral BID  . insulin aspart  0-15 Units Subcutaneous TID AC & HS  . lip balm  1 application Topical BID  . metoCLOPramide (REGLAN) injection  5 mg Intravenous Q6H  . metoprolol tartrate  50 mg Oral BID  . pantoprazole  40 mg Oral Daily  . polyethylene glycol  17 g Oral Daily  . sodium chloride  10-40 mL Intracatheter Q12H  . warfarin  5 mg Oral ONCE-1800  . warfarin  6 mg Oral ONCE-1800  . Warfarin - Pharmacist Dosing Inpatient   Does not apply q1800    Assessment/Plan SBO (small bowel obstruction) s/p expl lap, LOA, repair of enterotomy on 3/8-had prolonged postop ileus which is slowly resolving; on liquid diet.  C. Diff Colits 07/11/11, just finished flagyl. PE (  pulmonary embolism)  Chronic anticoagulation  Fall from standing  Fracture of hip, right, closed  Fecal impaction in rectum  Chronic constipation  Acute respiratory failure with hypoxia  Tachycardia  A-fib   Plan:  Ongoing ileus I would go slow with PO's, He's still on TNA for nutrition.     LOS: 35 days    [name] 07/29/2011

## 2011-07-30 ENCOUNTER — Inpatient Hospital Stay (HOSPITAL_COMMUNITY): Payer: Medicare HMO

## 2011-07-30 LAB — PHOSPHORUS: Phosphorus: 3.7 mg/dL (ref 2.3–4.6)

## 2011-07-30 LAB — COMPREHENSIVE METABOLIC PANEL
Albumin: 2 g/dL — ABNORMAL LOW (ref 3.5–5.2)
BUN: 23 mg/dL (ref 6–23)
Calcium: 8.6 mg/dL (ref 8.4–10.5)
Chloride: 104 mEq/L (ref 96–112)
Creatinine, Ser: 0.98 mg/dL (ref 0.50–1.35)
Total Bilirubin: 0.2 mg/dL — ABNORMAL LOW (ref 0.3–1.2)

## 2011-07-30 LAB — GLUCOSE, CAPILLARY
Glucose-Capillary: 142 mg/dL — ABNORMAL HIGH (ref 70–99)
Glucose-Capillary: 116 mg/dL — ABNORMAL HIGH (ref 70–99)
Glucose-Capillary: 120 mg/dL — ABNORMAL HIGH (ref 70–99)

## 2011-07-30 LAB — MAGNESIUM: Magnesium: 1.9 mg/dL (ref 1.5–2.5)

## 2011-07-30 LAB — PROTIME-INR: Prothrombin Time: 27.1 seconds — ABNORMAL HIGH (ref 11.6–15.2)

## 2011-07-30 MED ORDER — WARFARIN SODIUM 2.5 MG PO TABS
2.5000 mg | ORAL_TABLET | Freq: Once | ORAL | Status: AC
  Administered 2011-07-30: 2.5 mg via ORAL
  Filled 2011-07-30: qty 1

## 2011-07-30 MED ORDER — CLINIMIX E/DEXTROSE (5/15) 5 % IV SOLN
INTRAVENOUS | Status: AC
  Administered 2011-07-30: 17:00:00 via INTRAVENOUS
  Filled 2011-07-30: qty 2000

## 2011-07-30 MED ORDER — BOOST / RESOURCE BREEZE PO LIQD
1.0000 | Freq: Two times a day (BID) | ORAL | Status: DC
  Administered 2011-07-30 – 2011-08-04 (×7): 1 via ORAL

## 2011-07-30 NOTE — Progress Notes (Addendum)
PARENTERAL NUTRITION and ANTICOAGULATION CONSULT - FOLLOW UP  Pharmacy Consult for TNA Indication: SBO  Pharmacy Consult for Heparin/Coumadin Indication: VTE, afib, and r/o CVA  No Known Allergies  Patient Measurements: Height: 5\' 6"  (167.6 cm) Weight: 156 lb 12 oz (71.1 kg) IBW/kg (Calculated) : 63.8  Weight on admit: 162 lb (73kg)  Vital Signs: Temp: 98.8 F (37.1 C) (03/28 0639) Temp src: Oral (03/28 0639) BP: 146/79 mmHg (03/28 0639) Pulse Rate: 127  (03/28 0639)  Intake/Output from previous day: 03/27 0701 - 03/28 0700 In: -  Out: 1900 [Urine:1900]  Labs:  Basename 07/30/11 0409 07/29/11 0504 07/28/11 0515  WBC -- 7.6 --  HGB -- 8.0* --  HCT -- 25.2* --  PLT -- 285 --  APTT -- -- --  INR 2.46* 2.07* 1.80*     Basename 07/30/11 0409 07/29/11 0500 07/28/11 0515  NA 133* 133* 128*  K 4.2 4.5 4.3  CL 104 104 101  CO2 20 20 19   GLUCOSE 115* 145* 160*  BUN 23 25* 27*  CREATININE 0.98 1.04 1.03  LABCREA -- -- --  CREAT24HRUR -- -- --  CALCIUM 8.6 8.4 8.5  MG 1.9 -- --  PHOS 3.7 -- --  PROT 7.6 -- --  ALBUMIN 2.0* -- --  AST 26 -- --  ALT 19 -- --  ALKPHOS 158* -- --  BILITOT 0.2* -- --  BILIDIR -- -- --  IBILI -- -- --  PREALBUMIN -- -- --  TRIG -- -- --  CHOLHDL -- -- --  CHOL -- -- --   Estimated Creatinine Clearance: 58.8 ml/min (by C-G formula based on Cr of 0.98).   IV Fluid:  NS @ 75 ml/hr  Nutritional Goals:   1850-2110 kCal, 80-95 grams of protein per day  Clinimix E 5/20 28ml/hr + 20% Lipids 91ml/hr will provide 90gm protein, 1584 kcal on non-lipid days, 2064 kcal on lipid days, average 1790 kcal/week  Current Nutrition:  Clinimix E 5/20 at 75 ml/hr +/- IV lipids at 32ml/hr on MWF. FLD  CBGs: 116, 115 -> since new TNA bag hung, no SSI given  Assessment:  77 YOM s/p ORIF 2/22, GI bleed 2/28 thought to be due to diverticular disease, CT 3/4 consistent with SBO.   POD#20 LOA, serosal/enterotomy repair, fecal  disimpaction.  Tolerating TNA at goal rate of 75 ml/hr.  Patient is also taking Boost Plus daily, which provides 360 kcal and 14g per serving.  Although patient appears to be doing well with Clinimix E 5/20, this item is now of limited reserve Radio broadcast assistant).  Considering that this patient does not have hepatic or renal impairment, would be a good candidate for switching to our Clinimix E 5/15 formula.  Switching to Clinimix E 5/15 at 65 ml/hr + 20% Lipids 89ml/hr will provide 78 gm protein, 1109 kcal on non-lipid days, 1589 kcal on lipid days. With the Boost Plus daily, this provides 92 gm protein and 1949 kcal on the lipid days, 1459 on non-lipid days, average 1669 kcal/week.  Lytes: Low Na slightly improved, all other lytes WNL  Prealbumin: 13.6 (3/18), 21 (3/25)  Lipids: wnl   CBGs: controlled (cbgs<150) with 96 units/2L TNA bag.    Alkaline phosphatase rising: 158 (3/28) << 138 (3/25) << 90 (3/21), will monitor carefully.  Patient's ileus improving.  Surgery recommending not to advance diet past clears until having more BMs or flatus.  Pt has documented BMs x 2 yesterday.  Will f/u plan for advancing diet.  Please  let us know when we can start tapering TNA to help with appetite.  Plan:  Switch Clinimix formula to E 5/15 at 65 ml/hr. Continue adding insulin to TNA bag, but will reduce to 75 units considering change of formula and rate. Fat emulsion at 10 ml/hr, standard multivitamins and trace elements on MWF only due to ongoing shortage. F/u plan for advancing diet.  [name] 07/30/2011,7:50 AM  ________________________________________________  ANTICOAGULATION CONSULT NOTE - Follow Up Consult  Labs:  Basename 07/30/11 0409 07/29/11 0504 07/29/11 0500 07/28/11 0515  HGB -- 8.0* -- --  HCT -- 25.2* -- --  PLT -- 285 -- --  APTT -- -- -- --  LABPROT 27.1* 23.7* -- 21.2*  INR 2.46* 2.07* -- 1.80*  HEPARINUNFRC -- 0.17* -- 0.37  CREATININE 0.98 -- 1.04 1.03  CKTOTAL  -- -- -- --  CKMB -- -- -- --  TROPONINI -- -- -- --   Estimated Creatinine Clearance: 58.8 ml/min (by C-G formula based on Cr of 0.98).  Assessment:  77 yom on chronic warfarin for history of PE, DVTs, s/p ORIF on 2/22, ex-lap with LOA, enterotomy 3/8, acute CVA 3/9.  Coumadin resumed 3/21.    Heparin discontinued yesterday when INR>2.  Home Coumadin dose : 2.5mg  MWFSu, 5mg  other days.  INR within goal range following 6 mg doses.  Will try to return to home dosing schedule.  No bleeding reported/documented.  H/H low but stable.   Goal of Therapy:  INR 2-3   Plan:   Coumadin 2.5 mg po x 1 tonight  Daily PT/INR.  [name] 07/30/2011,8:30 AM

## 2011-07-30 NOTE — Progress Notes (Signed)
20 Days Post-Op   Subjective: Denies N/V. Tolerating full liquid diet. BM yesterday. Only occasional pain, tolerable.  Objective: Vital signs in last 24 hours: Temp:  [98 F (36.7 C)-98.8 F (37.1 C)] 98.8 F (37.1 C) Aug 28, 2022 0639) Pulse Rate:  [115-127] 126  August 28, 2022 0900) Resp:  [20] 20  August 28, 2022 0639) BP: (134-146)/(61-79) 146/79 mmHg 28-Aug-2022 0639) SpO2:  [92 %-95 %] 95 % 2022-08-28 0639) Last BM Date: 07/29/11  Intake/Output from previous day: 03/27 0701 - 2022-08-28 0700 In: -  Out: 1900 [Urine:1900]   General appearance: alert and no distress Resp: clear to auscultation bilaterally Cardio: regular rate and rhythm GI: normal findings: bowel sounds normal and soft, non-tender  Lab Results:  BMET  Basename 2011/08/28 0409 07/29/11 0500  NA 133* 133*  K 4.2 4.5  CL 104 104  CO2 20 20  GLUCOSE 115* 145*  BUN 23 25*  CREATININE 0.98 1.04  CALCIUM 8.6 8.4   PT/INR  Basename 2011-08-28 0409 07/29/11 0504  LABPROT 27.1* 23.7*  INR 2.46* 2.07*    Studies/Results: Dg Abd 2 Views  August 28, 2011  *RADIOLOGY REPORT*  Clinical Data: Small bowel obstruction  ABDOMEN - 2 VIEW  Comparison: July 28, 2011  Findings: The stool and bowel gas pattern is within normal limits with no evidence of obstruction or pneumoperitoneum.  Rectal gas is present.  Side plate and screw fixation of the right hip is anatomically aligned.  Thoricolumbar scoliosis and degenerative changes are again noted.  The visualized lung bases are clear.  IMPRESSION: Normal stool and bowel gas pattern.  Original Report Authenticated By: Brandon Melnick, M.D.    Assessment/Plan: SBO (small bowel obstruction) s/p expl lap, LOA, repair of enterotomy on 3/8 Ileus -- Advance to reg diet Multiple medical problems -- per primary    LOS: 36 days    Kathya Wilz J. 08-28-2011

## 2011-07-30 NOTE — Progress Notes (Signed)
Agree with above 

## 2011-07-30 NOTE — Progress Notes (Signed)
07/30/2011 [name] BSN CCM [phone] Pt has had problem with persistent ileus. Positive for BM's. Advancing diet. Abd film today. Con't TPN, Plans for SNF when ready

## 2011-07-30 NOTE — Progress Notes (Signed)
Nutrition Follow-up  Diet Order:  Regular Diet  Patient diet advanced to regular diet. Patient stated he dislikes pudding, Ensure and all tomato products. Poor PO intake at meals documented and reported 50%. Patient appears thin.  Continues on Clinimix 5/20 @ 75 ml/hr with lipids @ 10 ml/hr MWF. Provides 1789 kcal and 90 grams of protein on average.   Meds: Scheduled Meds:   . antiseptic oral rinse  15 mL Mouth Rinse q12n4p  . lactose free nutrition  237 mL Oral Q1400  . chlorhexidine  15 mL Mouth Rinse BID  . diltiazem  180 mg Oral Daily  . docusate sodium  100 mg Oral BID  . insulin aspart  0-15 Units Subcutaneous TID AC & HS  . lip balm  1 application Topical BID  . metoCLOPramide (REGLAN) injection  5 mg Intravenous Q6H  . metoprolol tartrate  50 mg Oral BID  . pantoprazole  40 mg Oral Daily  . polyethylene glycol  17 g Oral Daily  . sodium chloride  10-40 mL Intracatheter Q12H  . warfarin  2.5 mg Oral ONCE-1800  . warfarin  5 mg Oral ONCE-1800  . Warfarin - Pharmacist Dosing Inpatient   Does not apply q1800   Continuous Infusions:   . sodium chloride 75 mL/hr at 07/30/11 0025  . fat emulsion 240 mL (07/29/11 1818)  . TPN (CLINIMIX) +/- additives    . TPN (CLINIMIX) +/- additives 75 mL/hr at 07/28/11 1722  . TPN (CLINIMIX) +/- additives 75 mL/hr at 07/29/11 1817   PRN Meds:.acetaminophen (TYLENOL) oral liquid 160 mg/5 mL, alum & mag hydroxide-simeth, metoCLOPramide (REGLAN) injection, metoprolol, morphine injection, phenol, sodium chloride  Labs:  CMP     Component Value Date/Time   NA 133* 07/30/2011 0409   K 4.2 07/30/2011 0409   CL 104 07/30/2011 0409   CO2 20 07/30/2011 0409   GLUCOSE 115* 07/30/2011 0409   BUN 23 07/30/2011 0409   CREATININE 0.98 07/30/2011 0409   CALCIUM 8.6 07/30/2011 0409   PROT 7.6 07/30/2011 0409   ALBUMIN 2.0* 07/30/2011 0409   AST 26 07/30/2011 0409   ALT 19 07/30/2011 0409   ALKPHOS 158* 07/30/2011 0409   BILITOT 0.2* 07/30/2011 0409   GFRNONAA  78* 07/30/2011 0409   GFRAA >90 07/30/2011 0409     Intake/Output Summary (Last 24 hours) at 07/30/11 1300 Last data filed at 07/30/11 1324  Gross per 24 hour  Intake      0 ml  Output   2900 ml  Net  -2900 ml    Weight Status: Weight is up 9 pounds from previous weight documented in RD note. Weight  Obtained 3/22 156 lb..  *noted weight has been variable and ranges between 134-162 lb.   Nutrition Dx:  Inadequate oral intake, Ongoing.  Goal:   1. Positive tolerance of diet. 2. PO intake >75% at meals.  Intervention:   1. Will continue to send Boost once a day. Provides 360 kcal and 14 grams of protein. 2. Will order Resource Breeze BID. Provides 500 kcal and 20 grams of protein.  3. RD to follow nutrition needs.   Monitor:  PO intake, diet tolerance, labs, weight trends   Adron Bene Pager #:  [phone]

## 2011-07-30 NOTE — Progress Notes (Signed)
Subjective: Patient without any specific complaint, no nausea no vomiting  Objective: Vital signs in last 24 hours: Temp:  [98 F (36.7 C)-98.8 F (37.1 C)] 98.8 F (37.1 C) (03/28 0639) Pulse Rate:  [115-127] 126  (03/28 0900) Resp:  [20] 20  (03/28 0639) BP: (134-146)/(61-79) 146/79 mmHg (03/28 0639) SpO2:  [92 %-95 %] 95 % (03/28 4098) Weight change:  Last BM Date: 07/29/11  Intake/Output from previous day: 03/27 0701 - 03/28 0700 In: -  Out: 1900 [Urine:1900] Intake/Output this shift: Total I/O In: -  Out: 1000 [Urine:1000]  General appearance: alert and cooperative Resp: clear to auscultation bilaterally Cardio: regular rate and rhythm, S1, S2 normal, no murmur, click, rub or gallop GI: soft, positive bowel sounds, nontender, no hepatosplenomegaly Extremities: extremities normal, atraumatic, no cyanosis or edema Neurologic: Motor: left hemiparesis  Lab Results:  Results for orders placed during the hospital encounter of 06/24/11 (from the past 24 hour(s))  GLUCOSE, CAPILLARY     Status: Abnormal   Collection Time   07/29/11  5:12 PM      Component Value Range   Glucose-Capillary 133 (*) 70 - 99 (mg/dL)   Comment 1 Documented in Chart     Comment 2 Notify RN    GLUCOSE, CAPILLARY     Status: Abnormal   Collection Time   07/29/11  9:30 PM      Component Value Range   Glucose-Capillary 116 (*) 70 - 99 (mg/dL)  COMPREHENSIVE METABOLIC PANEL     Status: Abnormal   Collection Time   07/30/11  4:09 AM      Component Value Range   Sodium 133 (*) 135 - 145 (mEq/L)   Potassium 4.2  3.5 - 5.1 (mEq/L)   Chloride 104  96 - 112 (mEq/L)   CO2 20  19 - 32 (mEq/L)   Glucose, Bld 115 (*) 70 - 99 (mg/dL)   BUN 23  6 - 23 (mg/dL)   Creatinine, Ser 1.19  0.50 - 1.35 (mg/dL)   Calcium 8.6  8.4 - 14.7 (mg/dL)   Total Protein 7.6  6.0 - 8.3 (g/dL)   Albumin 2.0 (*) 3.5 - 5.2 (g/dL)   AST 26  0 - 37 (U/L)   ALT 19  0 - 53 (U/L)   Alkaline Phosphatase 158 (*) 39 - 117 (U/L)   Total Bilirubin 0.2 (*) 0.3 - 1.2 (mg/dL)   GFR calc non Af Amer 78 (*) >90 (mL/min)   GFR calc Af Amer >90  >90 (mL/min)  MAGNESIUM     Status: Normal   Collection Time   07/30/11  4:09 AM      Component Value Range   Magnesium 1.9  1.5 - 2.5 (mg/dL)  PHOSPHORUS     Status: Normal   Collection Time   07/30/11  4:09 AM      Component Value Range   Phosphorus 3.7  2.3 - 4.6 (mg/dL)  PROTIME-INR     Status: Abnormal   Collection Time   07/30/11  4:09 AM      Component Value Range   Prothrombin Time 27.1 (*) 11.6 - 15.2 (seconds)   INR 2.46 (*) 0.00 - 1.49   GLUCOSE, CAPILLARY     Status: Abnormal   Collection Time   07/30/11  8:27 AM      Component Value Range   Glucose-Capillary 142 (*) 70 - 99 (mg/dL)  GLUCOSE, CAPILLARY     Status: Abnormal   Collection Time   07/30/11 12:04 PM  Component Value Range   Glucose-Capillary 116 (*) 70 - 99 (mg/dL)      Studies/Results: Dg Abd 2 Views  16-Aug-2011  *RADIOLOGY REPORT*  Clinical Data: Small bowel obstruction  ABDOMEN - 2 VIEW  Comparison: July 28, 2011  Findings: The stool and bowel gas pattern is within normal limits with no evidence of obstruction or pneumoperitoneum.  Rectal gas is present.  Side plate and screw fixation of the right hip is anatomically aligned.  Thoricolumbar scoliosis and degenerative changes are again noted.  The visualized lung bases are clear.  IMPRESSION: Normal stool and bowel gas pattern.  Original Report Authenticated By: Brandon Melnick, M.D.    Medications:  Scheduled:   . antiseptic oral rinse  15 mL Mouth Rinse q12n4p  . lactose free nutrition  237 mL Oral Q1400  . chlorhexidine  15 mL Mouth Rinse BID  . diltiazem  180 mg Oral Daily  . docusate sodium  100 mg Oral BID  . feeding supplement  1 Container Oral BID  . insulin aspart  0-15 Units Subcutaneous TID AC & HS  . lip balm  1 application Topical BID  . metoCLOPramide (REGLAN) injection  5 mg Intravenous Q6H  . metoprolol tartrate  50 mg  Oral BID  . pantoprazole  40 mg Oral Daily  . polyethylene glycol  17 g Oral Daily  . sodium chloride  10-40 mL Intracatheter Q12H  . warfarin  2.5 mg Oral ONCE-1800  . warfarin  5 mg Oral ONCE-1800  . Warfarin - Pharmacist Dosing Inpatient   Does not apply q1800   Continuous:   . sodium chloride 75 mL/hr at 08-16-2011 0025  . fat emulsion 240 mL (07/29/11 1818)  . TPN (CLINIMIX) +/- additives    . TPN (CLINIMIX) +/- additives 75 mL/hr at 07/28/11 1722  . TPN (CLINIMIX) +/- additives 75 mL/hr at 07/29/11 1817    Assessment/Plan: Ileus clinically improve, followup x-ray shows resolution, continue to advance diet slowly. Saline lock IV fluids, change Reglan to p.o., mobilized CVA, left hemiparesis, INR therapeutic, continue physical therapy Sinus tachycardia multifactorial improve Anemia multifactorial, would like to keep hemoglobin greater than 8 Hyponatremia resolved Malnutrition wean TPN as diet allows History of PE  Disposition to skilled nursing facility as clinical course allows particularly if he tolerates his diet  LOS: 36 days   Cohan Stipes D Aug 16, 2011, 3:03 PM

## 2011-07-30 NOTE — Progress Notes (Signed)
Advance diet as tolerated. Pt with improving ileus.

## 2011-07-31 LAB — GLUCOSE, CAPILLARY
Glucose-Capillary: 140 mg/dL — ABNORMAL HIGH (ref 70–99)
Glucose-Capillary: 102 mg/dL — ABNORMAL HIGH (ref 70–99)

## 2011-07-31 LAB — PROTIME-INR: INR: 2.06 — ABNORMAL HIGH (ref 0.00–1.49)

## 2011-07-31 MED ORDER — TRACE MINERALS CR-CU-MN-SE-ZN 10-1000-500-60 MCG/ML IV SOLN
INTRAVENOUS | Status: DC
  Filled 2011-07-31: qty 2000

## 2011-07-31 MED ORDER — WARFARIN SODIUM 5 MG PO TABS
5.0000 mg | ORAL_TABLET | Freq: Once | ORAL | Status: AC
  Administered 2011-07-31: 5 mg via ORAL
  Filled 2011-07-31: qty 1

## 2011-07-31 MED ORDER — FAT EMULSION 20 % IV EMUL
240.0000 mL | INTRAVENOUS | Status: DC
  Administered 2011-07-31: 240 mL via INTRAVENOUS
  Filled 2011-07-31: qty 250

## 2011-07-31 NOTE — Progress Notes (Signed)
Subjective: The patient is tolerating diet, eating slowly and no N/V and denies abdominal pain  Objective: Vital signs in last 24 hours: Temp:  [98 F (36.7 C)-99.5 F (37.5 C)] 99.1 F (37.3 C) (03/29 0451) Pulse Rate:  [116-136] 125  (03/29 0950) Resp:  [18-20] 20  (03/29 0451) BP: (133-163)/(79-94) 152/89 mmHg (03/29 0950) SpO2:  [95 %-98 %] 98 % (03/29 0840) Weight change:  Last BM Date: 07/29/11  Intake/Output from previous day: Aug 23, 2022 0701 - 03/29 0700 In: 6014.5 [P.O.:240; I.V.:5767.5; IV Piggyback:7] Out: 4251 [Urine:4250; Stool:1] Intake/Output this shift: Total I/O In: 10 [I.V.:10] Out: -   Cardio: regular rate and rhythm, S1, S2 normal, no murmur, click, rub or gallop GI: soft, non-tender; bowel sounds normal; no masses,  no organomegaly  Lab Results:  [facility] 07/29/11 0504  WBC 7.6  HGB 8.0*  HCT 25.2*  PLT 285   BMET  Basename 08-23-11 0409 07/29/11 0500  NA 133* 133*  K 4.2 4.5  CL 104 104  CO2 20 20  GLUCOSE 115* 145*  BUN 23 25*  CREATININE 0.98 1.04  CALCIUM 8.6 8.4    Studies/Results: Dg Abd 2 Views  08-23-2011  *RADIOLOGY REPORT*  Clinical Data: Small bowel obstruction  ABDOMEN - 2 VIEW  Comparison: July 28, 2011  Findings: The stool and bowel gas pattern is within normal limits with no evidence of obstruction or pneumoperitoneum.  Rectal gas is present.  Side plate and screw fixation of the right hip is anatomically aligned.  Thoricolumbar scoliosis and degenerative changes are again noted.  The visualized lung bases are clear.  IMPRESSION: Normal stool and bowel gas pattern.  Original Report Authenticated By: Brandon Melnick, M.D.    Medications: I have reviewed the patient's current medications.  Assessment/Plan: Ileus: clinically improving continue diet Anemia check cbc and bmet in am Deconditioned hopefully can start increased PT when TPN off  LOS: 37 days   [facility] 07/31/2011, 10:27 AM

## 2011-07-31 NOTE — Progress Notes (Signed)
Physical Therapy Treatment Patient Details Name: [name] MRN: [id number] DOB: 1934/03/13 Today's Date: 07/31/2011  PT Assessment/Plan  PT - Assessment/Plan Comments on Treatment Session: Pt performed standing with sara plus to encourage weight bearing through L extremities. PT Plan: Discharge plan remains appropriate;Frequency remains appropriate Follow Up Recommendations: Skilled nursing facility Equipment Recommended: Defer to next venue PT Goals  Acute Rehab PT Goals PT Goal: Supine/Side to Sit - Progress: Progressing toward goal PT Goal: Sit at [facility] Of Bed - Progress: Progressing toward goal PT Goal: Sit to Stand - Progress: Progressing toward goal  PT Treatment Precautions/Restrictions  Precautions Precautions: Fall Precaution Comments: s/p ORIF R hip - no percautions - WBAT - CVA w/ L emiparesis Required Braces or Orthoses: No Restrictions Weight Bearing Restrictions: No RLE Weight Bearing: Weight bearing as tolerated Other Position/Activity Restrictions: Pt aware he is WBAT Mobility (including Balance) Bed Mobility Bed Mobility: Yes Supine to Sit: 1: +2 Total assist;Patient percentage (comment);HOB elevated (Comment degrees) Supine to Sit Details (indicate cue type and reason): pt<10%, pt able to assist very little, used bed pad to assist with transfer  Sitting - Scoot to Edge of Bed Details (indicate cue type and reason): pt<10%, used bed pad to assist with transfer  Transfers Transfers: Yes Sit to Stand: Other (comment) Sit to Stand Details (indicate cue type and reason): Used sara plus to encourage weight bearing through left extremities, pt unable to tolerate standing more than one minute 2* "can't breathe" so with second stand moved sara plus to assist pt to chair.  Balance Balance Assessed: Yes Static Sitting Balance Static Sitting - Balance Support: Right upper extremity supported;Feet supported Static Sitting - Level of Assistance: 1: +1 Total  assist Static Sitting - Comment/# of Minutes: pt continues to require increased assist, able to hold upper body upright for approx 5 seconds before requiring assist for trunk control Exercise    End of Session PT - End of Session Activity Tolerance: Patient limited by fatigue Patient left: in chair;with call bell in reach;with family/visitor present General Behavior During Session: [facility] for tasks performed Cognition: [facility] for tasks performed  Kindle Strohmeier,[name] 07/31/2011, 1:11 PM Pager: [phone]

## 2011-07-31 NOTE — Progress Notes (Signed)
Patient ID: [name], male   DOB: [birth year], 78 y.o.   MRN: [id number] [facility] Surgery Progress Note:   21 Days Post-Op  Subjective: Mental status is clear.   Objective: Vital signs in last 24 hours: Temp:  [98 F (36.7 C)-99.5 F (37.5 C)] 99.1 F (37.3 C) (03/29 0451) Pulse Rate:  [116-136] 125  (03/29 0950) Resp:  [18-20] 20  (03/29 0451) BP: (133-163)/(79-94) 152/89 mmHg (03/29 0950) SpO2:  [95 %-98 %] 98 % (03/29 0840)  Intake/Output from previous day: 03/28 0701 - 03/29 0700 In: 6014.5 [P.O.:240; I.V.:5767.5; IV Piggyback:7] Out: 4251 [Urine:4250; Stool:1] Intake/Output this shift: Total I/O In: 10 [I.V.:10] Out: -   Physical Exam: Work of breathing is  Slightly increased.  PTworking with patient.  He is concerned about constipation if he takes solids.  Lab Results:  Results for orders placed during the hospital encounter of 06/24/11 (from the past 48 hour(s))  GLUCOSE, CAPILLARY     Status: Abnormal   Collection Time   07/29/11 12:45 PM      Component Value Range Comment   Glucose-Capillary 145 (*) 70 - 99 (mg/dL)   GLUCOSE, CAPILLARY     Status: Abnormal   Collection Time   07/29/11  5:12 PM      Component Value Range Comment   Glucose-Capillary 133 (*) 70 - 99 (mg/dL)    Comment 1 Documented in Chart      Comment 2 Notify RN     GLUCOSE, CAPILLARY     Status: Abnormal   Collection Time   07/29/11  9:30 PM      Component Value Range Comment   Glucose-Capillary 116 (*) 70 - 99 (mg/dL)   COMPREHENSIVE METABOLIC PANEL     Status: Abnormal   Collection Time   07/30/11  4:09 AM      Component Value Range Comment   Sodium 133 (*) 135 - 145 (mEq/L)    Potassium 4.2  3.5 - 5.1 (mEq/L)    Chloride 104  96 - 112 (mEq/L)    CO2 20  19 - 32 (mEq/L)    Glucose, Bld 115 (*) 70 - 99 (mg/dL)    BUN 23  6 - 23 (mg/dL)    Creatinine, Ser 4.09  0.50 - 1.35 (mg/dL)    Calcium 8.6  8.4 - 10.5 (mg/dL)    Total Protein 7.6  6.0 - 8.3 (g/dL)    Albumin 2.0 (*) 3.5 -  5.2 (g/dL)    AST 26  0 - 37 (U/L)    ALT 19  0 - 53 (U/L)    Alkaline Phosphatase 158 (*) 39 - 117 (U/L)    Total Bilirubin 0.2 (*) 0.3 - 1.2 (mg/dL)    GFR calc non Af Amer 78 (*) >90 (mL/min)    GFR calc Af Amer >90  >90 (mL/min)   MAGNESIUM     Status: Normal   Collection Time   07/30/11  4:09 AM      Component Value Range Comment   Magnesium 1.9  1.5 - 2.5 (mg/dL)   PHOSPHORUS     Status: Normal   Collection Time   07/30/11  4:09 AM      Component Value Range Comment   Phosphorus 3.7  2.3 - 4.6 (mg/dL)   PROTIME-INR     Status: Abnormal   Collection Time   07/30/11  4:09 AM      Component Value Range Comment   Prothrombin Time 27.1 (*) 11.6 - 15.2 (seconds)  INR 2.46 (*) 0.00 - 1.49    GLUCOSE, CAPILLARY     Status: Abnormal   Collection Time   07/30/11  8:27 AM      Component Value Range Comment   Glucose-Capillary 142 (*) 70 - 99 (mg/dL)   GLUCOSE, CAPILLARY     Status: Abnormal   Collection Time   07/30/11 12:04 PM      Component Value Range Comment   Glucose-Capillary 116 (*) 70 - 99 (mg/dL)   GLUCOSE, CAPILLARY     Status: Abnormal   Collection Time   07/30/11  5:16 PM      Component Value Range Comment   Glucose-Capillary 140 (*) 70 - 99 (mg/dL)   GLUCOSE, CAPILLARY     Status: Abnormal   Collection Time   07/30/11 10:42 PM      Component Value Range Comment   Glucose-Capillary 120 (*) 70 - 99 (mg/dL)   PROTIME-INR     Status: Abnormal   Collection Time   07/31/11  4:10 AM      Component Value Range Comment   Prothrombin Time 23.6 (*) 11.6 - 15.2 (seconds)    INR 2.06 (*) 0.00 - 1.49    GLUCOSE, CAPILLARY     Status: Abnormal   Collection Time   07/31/11  7:53 AM      Component Value Range Comment   Glucose-Capillary 113 (*) 70 - 99 (mg/dL)     Radiology/Results: Dg Abd 2 Views  07/30/2011  *RADIOLOGY REPORT*  Clinical Data: Small bowel obstruction  ABDOMEN - 2 VIEW  Comparison: July 28, 2011  Findings: The stool and bowel gas pattern is within normal  limits with no evidence of obstruction or pneumoperitoneum.  Rectal gas is present.  Side plate and screw fixation of the right hip is anatomically aligned.  Thoricolumbar scoliosis and degenerative changes are again noted.  The visualized lung bases are clear.  IMPRESSION: Normal stool and bowel gas pattern.  Original Report Authenticated By: Brandon Melnick, M.D.    Anti-infectives: Anti-infectives     Start     Dose/Rate Route Frequency Ordered Stop   07/15/11 0900   vancomycin (VANCOCIN) IVPB 1000 mg/200 mL premix  Status:  Discontinued        1,000 mg 200 mL/hr over 60 Minutes Intravenous Every 12 hours 07/15/11 0407 07/16/11 1325   07/12/11 1800   metroNIDAZOLE (FLAGYL) IVPB 500 mg  Status:  Discontinued        500 mg 100 mL/hr over 60 Minutes Intravenous 4 times per day 07/12/11 1645 07/23/11 0930   07/12/11 1530   vancomycin (VANCOCIN) 500 mg in sodium chloride irrigation 0.9 % 100 mL enema        500 mg 66.7 mL/hr over 90 Minutes Rectal 4 times per day 07/12/11 1450 07/13/11 0108   07/11/11 2359   vancomycin (VANCOCIN) IVPB 1000 mg/200 mL premix  Status:  Discontinued        1,000 mg 200 mL/hr over 60 Minutes Intravenous Every 24 hours 07/11/11 0807 07/15/11 0407   07/10/11 1800   vancomycin (VANCOCIN) 500 mg in sodium chloride 0.9 % 100 mL IVPB  Status:  Discontinued        500 mg 100 mL/hr over 60 Minutes Intravenous Every 12 hours 07/10/11 1641 07/11/11 0806   07/10/11 1800   piperacillin-tazobactam (ZOSYN) IVPB 3.375 g  Status:  Discontinued        3.375 g 12.5 mL/hr over 240 Minutes Intravenous Every 8 hours 07/10/11 1708  07/15/11 0720   07/10/11 0800   ertapenem (INVANZ) 1 g in sodium chloride 0.9 % 50 mL IVPB  Status:  Discontinued        1 g 100 mL/hr over 30 Minutes Intravenous Every 24 hours 07/10/11 0739 07/10/11 1659   06/26/11 2000   ceFAZolin (ANCEF) IVPB 1 g/50 mL premix        1 g 100 mL/hr over 30 Minutes Intravenous Every 6 hours 06/26/11 1658 06/27/11  0857          Assessment/Plan: Problem List: Patient Active Problem List  Diagnoses  . PE (pulmonary embolism)  . Chronic anticoagulation  . Fall from standing  . Fracture of hip, right, closed  . Peripheral arterial disease  . SBO (small bowel obstruction)  . Fecal impaction in rectum  . Chronic constipation  . Acute respiratory failure with hypoxia  . Tachycardia  . A-fib    Slow recovery.  Will advance to solid diet 21 Days Post-Op    LOS: 37 days   Matt B. Daphine Deutscher, MD, [facility] [phone] beeper [phone]  07/31/2011 11:29 AM

## 2011-07-31 NOTE — Progress Notes (Signed)
CSW following to assist with d/c planning. [facility] has offered SNF bed to this pt once stable for d/c and [employer] has provided prior authorization. SNF can also provide TPN if needed. SNF will need advanced notice in order to supple TPN and request prior auth from insurance. CSW will continue to follow to assist with d/c planning.  Cori Razor LCSW [phone]

## 2011-07-31 NOTE — Plan of Care (Signed)
Problem: Consults Goal: Nutrition Consult-if indicated Outcome: Completed/Met Date Met:  07/31/11 TNA & lipids infusing

## 2011-07-31 NOTE — Progress Notes (Signed)
PARENTERAL NUTRITION and ANTICOAGULATION CONSULT - FOLLOW UP  Pharmacy Consult for TNA Indication: SBO  Pharmacy Consult for Heparin/Coumadin Indication: VTE, afib, and r/o CVA  No Known Allergies  Patient Measurements: Height: 5\' 6"  (167.6 cm) Weight: 156 lb 12 oz (71.1 kg) IBW/kg (Calculated) : 63.8  Weight on admit: 162 lb (73kg)  Vital Signs: Temp: 99.1 F (37.3 C) (03/29 0451) Temp src: Oral (03/29 0451) BP: 133/79 mmHg (03/29 0451) Pulse Rate: 126  (03/29 0451)  Intake/Output from previous day: 03/28 0701 - 03/29 0700 In: 6014.5 [P.O.:240; I.V.:5767.5; IV Piggyback:7] Out: 4251 [Urine:4250; Stool:1]  Labs:  [name] 07/31/11 0410 07/30/11 0409 07/29/11 0504  WBC -- -- 7.6  HGB -- -- 8.0*  HCT -- -- 25.2*  PLT -- -- 285  APTT -- -- --  INR 2.06* 2.46* 2.07*     [name] 07/30/11 0409 07/29/11 0500  NA 133* 133*  K 4.2 4.5  CL 104 104  CO2 20 20  GLUCOSE 115* 145*  BUN 23 25*  CREATININE 0.98 1.04  LABCREA -- --  CREAT24HRUR -- --  CALCIUM 8.6 8.4  MG 1.9 --  PHOS 3.7 --  PROT 7.6 --  ALBUMIN 2.0* --  AST 26 --  ALT 19 --  ALKPHOS 158* --  BILITOT 0.2* --  BILIDIR -- --  IBILI -- --  PREALBUMIN -- --  TRIG -- --  CHOLHDL -- --  CHOL -- --   Estimated Creatinine Clearance: 58.8 ml/min (by C-G formula based on Cr of 0.98).   IV Fluid:  NS @ 75 ml/hr  Nutritional Goals:   1850-2110 kCal, 80-95 grams of protein per day  Clinimix E 5/15 at 65 ml/hr + 20% Lipids 93ml/hr will provide 78 gm protein, 1109 kcal on non-lipid days, 1589 kcal on lipid days. With the Boost Plus daily, this provides 92 gm protein and 1949 kcal on the lipid days, 1459 on non-lipid days, average 1669 kcal/week.  Current Nutrition:  Clinimix E 5/15 at 65 ml/hr +/- IV lipids at 55ml/hr on MWF. Boost Plus once daily. Resource Breeze BID. Regular diet, starting 3/28  CBGs: 120, 113 -> since new TNA bag hung, no SSI given  Assessment:  77 YOM s/p ORIF 2/22, GI  bleed 2/28 thought to be due to diverticular disease, CT 3/4 consistent with SBO.   POD#21 LOA, serosal/enterotomy repair, fecal disimpaction.  Patient was switched to Clinimix E 5/15 yesterday since Clinimix E 5/20 is now of limited reserve Radio broadcast assistant).  Patient appears to be tolerating switch.  Patient has been taking Boost Plus daily, which provides 360 kcal and 14g per serving.  Yesterday, Resource Breeze BID was added (providing 500 kcal and 18 grams of protein daily) and patient was advanced to regular diet.  Lytes: Low Na slightly improved, all other lytes WNL. No BMET today.  Prealbumin: 13.6 (3/18), 21 (3/25)  Lipids: wnl   CBGs: controlled (cbgs<150) with 75 units/2L TNA bag.  CBGs are relatively tightly controlled now and since we will be adjusting the TNA rate with advancing in oral diet/supplements, will reduce insulin in TNA slightly today to be cautious.  Alkaline phosphatase rising: 158 (3/28) << 138 (3/25) << 90 (3/21), will monitor carefully.  Patient's ileus improving, pt is positive for BM's.  Will adjust TNA today to account for extra calories supplemented with Boost Plus and Resource Breeze.  Reducing TNA to Climinix E 5/15 to 21ml/hr with Boost Plus and Resource Cleotis Nipper will provide 92 gm protein and 2192 kcal  on lipid days (today).  Please let us know when we can start tapering TNA to help with appetite.  Plan:  Adjust Clinimix E 5/15 to 50 ml/hr. Continue adding insulin to TNA bag, but will reduce to 68 units considering rate change. Fat emulsion at 10 ml/hr, standard multivitamins and trace elements on MWF only due to ongoing shortage. F/u plan for advancing diet.  [name] 07/31/2011,8:33 AM  ________________________________________________  ANTICOAGULATION CONSULT NOTE - Follow Up Consult  Labs:  [name] 07/31/11 0410 07/30/11 0409 07/29/11 0504 07/29/11 0500  HGB -- -- 8.0* --  HCT -- -- 25.2* --  PLT -- -- 285 --  APTT -- -- -- --    LABPROT 23.6* 27.1* 23.7* --  INR 2.06* 2.46* 2.07* --  HEPARINUNFRC -- -- 0.17* --  CREATININE -- 0.98 -- 1.04  CKTOTAL -- -- -- --  CKMB -- -- -- --  TROPONINI -- -- -- --   Estimated Creatinine Clearance: 58.8 ml/min (by C-G formula based on Cr of 0.98).  Assessment:  77 yom on chronic warfarin for history of PE, DVTs, s/p ORIF on 2/22, ex-lap with LOA, enterotomy 3/8, acute CVA 3/9.  Coumadin resumed 3/21.    Heparin discontinued yesterday when INR>2.  Home Coumadin dose : 2.5mg  MWFSu, 5mg  other days.  INR within goal range following 6 mg doses.  Will try to return to home dosing schedule.  No bleeding reported/documented.  H/H low but stable.   Goal of Therapy:  INR 2-3   Plan:   Coumadin 5 mg po x 1 tonight  Daily PT/INR.  [name] 07/30/2011,8:30 AM

## 2011-08-01 LAB — DIFFERENTIAL
Basophils Absolute: 0 10*3/uL (ref 0.0–0.1)
Basophils Relative: 0 % (ref 0–1)
Eosinophils Relative: 4 % (ref 0–5)
Lymphocytes Relative: 22 % (ref 12–46)
Monocytes Absolute: 0.7 10*3/uL (ref 0.1–1.0)
Monocytes Relative: 10 % (ref 3–12)
Neutro Abs: 4.2 10*3/uL (ref 1.7–7.7)

## 2011-08-01 LAB — CBC
HCT: 28.1 % — ABNORMAL LOW (ref 39.0–52.0)
Hemoglobin: 8.8 g/dL — ABNORMAL LOW (ref 13.0–17.0)
MCHC: 31.3 g/dL (ref 30.0–36.0)
MCV: 87 fL (ref 78.0–100.0)
RDW: 17.3 % — ABNORMAL HIGH (ref 11.5–15.5)

## 2011-08-01 LAB — BASIC METABOLIC PANEL
BUN: 23 mg/dL (ref 6–23)
CO2: 20 mEq/L (ref 19–32)
Calcium: 8.7 mg/dL (ref 8.4–10.5)
Chloride: 103 mEq/L (ref 96–112)
Creatinine, Ser: 0.93 mg/dL (ref 0.50–1.35)

## 2011-08-01 LAB — GLUCOSE, CAPILLARY
Glucose-Capillary: 127 mg/dL — ABNORMAL HIGH (ref 70–99)
Glucose-Capillary: 121 mg/dL — ABNORMAL HIGH (ref 70–99)
Glucose-Capillary: 108 mg/dL — ABNORMAL HIGH (ref 70–99)

## 2011-08-01 MED ORDER — INSULIN REGULAR HUMAN 100 UNIT/ML IJ SOLN
INTRAVENOUS | Status: DC
  Filled 2011-08-01: qty 1000

## 2011-08-01 MED ORDER — INSULIN ASPART 100 UNIT/ML ~~LOC~~ SOLN
0.0000 [IU] | Freq: Three times a day (TID) | SUBCUTANEOUS | Status: DC
  Administered 2011-08-01 (×2): 1 [IU] via SUBCUTANEOUS

## 2011-08-01 MED ORDER — WARFARIN SODIUM 5 MG PO TABS
5.0000 mg | ORAL_TABLET | Freq: Once | ORAL | Status: AC
  Administered 2011-08-01: 5 mg via ORAL
  Filled 2011-08-01: qty 1

## 2011-08-01 NOTE — Progress Notes (Signed)
Subjective: Feels well, conversive, NAD. Ate well yesterday. Likes Boost  Objective: Weight change:   Intake/Output Summary (Last 24 hours) at 08/01/11 0728 Last data filed at 08/01/11 0700  Gross per 24 hour  Intake   3345 ml  Output   2126 ml  Net   1219 ml   Filed Vitals:   07/31/11 0950 07/31/11 1429 07/31/11 2117 08/01/11 0622  BP: 152/89 143/81 151/81 122/76  Pulse: 125 115 128 110  Temp:  97.6 F (36.4 C) 97.2 F (36.2 C) 97.7 F (36.5 C)  TempSrc:  Oral Oral Oral  Resp:  18 18 18   Height:      Weight:      SpO2:  98% 96% 97%   General Appearance: Alert, cooperative, no distress, appears stated age Head: Normocephalic, without obvious abnormality, atraumatic Neck: Supple, symmetrical Lungs: Clear to auscultation bilaterally, respirations unlabored Heart: Regular rate and rhythm, S1 and S2 normal, no murmur, rub or gallop, mildly increased rate Abdomen: Soft, non-tender, bowel sounds active all four quadrants Extremities: Extremities normal, atraumatic, no cyanosis or edema Foley: Cloudy urine Pulses: 2+ and symmetric all extremities Skin: Skin color, texture, turgor normal, no rashes or lesions Neuro: CNII-XII intact. Left hemiparesis   Lab Results:  Basename 08/01/11 0453 August 29, 2011 0409  NA 132* 133*  K 4.2 4.2  CL 103 104  CO2 20 20  GLUCOSE 117* 115*  BUN 23 23  CREATININE 0.93 0.98  CALCIUM 8.7 8.6  MG -- 1.9  PHOS -- 3.7    Basename 29-Aug-2011 0409  AST 26  ALT 19  ALKPHOS 158*  BILITOT 0.2*  PROT 7.6  ALBUMIN 2.0*   No results found for this basename: LIPASE:2,AMYLASE:2 in the last 72 hours  Basename 08/01/11 0453  WBC 6.6  NEUTROABS 4.2  HGB 8.8*  HCT 28.1*  MCV 87.0  PLT 325   No results found for this basename: CKTOTAL:3,CKMB:3,CKMBINDEX:3,TROPONINI:3 in the last 72 hours No components found with this basename: POCBNP:3 No results found for this basename: DDIMER:2 in the last 72 hours No results found for this basename: HGBA1C:2  in the last 72 hours No results found for this basename: CHOL:2,HDL:2,LDLCALC:2,TRIG:2,CHOLHDL:2,LDLDIRECT:2 in the last 72 hours No results found for this basename: TSH,T4TOTAL,FREET3,T3FREE,THYROIDAB in the last 72 hours No results found for this basename: VITAMINB12:2,FOLATE:2,FERRITIN:2,TIBC:2,IRON:2,RETICCTPCT:2 in the last 72 hours  Studies/Results: Dg Abd 2 Views  08-29-2011  *RADIOLOGY REPORT*  Clinical Data: Small bowel obstruction  ABDOMEN - 2 VIEW  Comparison: July 28, 2011  Findings: The stool and bowel gas pattern is within normal limits with no evidence of obstruction or pneumoperitoneum.  Rectal gas is present.  Side plate and screw fixation of the right hip is anatomically aligned.  Thoricolumbar scoliosis and degenerative changes are again noted.  The visualized lung bases are clear.  IMPRESSION: Normal stool and bowel gas pattern.  Original Report Authenticated By: Brandon Melnick, M.D.   Medications: Scheduled Meds:   . antiseptic oral rinse  15 mL Mouth Rinse q12n4p  . lactose free nutrition  237 mL Oral Q1400  . chlorhexidine  15 mL Mouth Rinse BID  . diltiazem  180 mg Oral Daily  . docusate sodium  100 mg Oral BID  . feeding supplement  1 Container Oral BID  . insulin aspart  0-15 Units Subcutaneous TID AC & HS  . lip balm  1 application Topical BID  . metoCLOPramide (REGLAN) injection  5 mg Intravenous Q6H  . metoprolol tartrate  50 mg Oral BID  .  pantoprazole  40 mg Oral Daily  . polyethylene glycol  17 g Oral Daily  . sodium chloride  10-40 mL Intracatheter Q12H  . warfarin  5 mg Oral ONCE-1800  . Warfarin - Pharmacist Dosing Inpatient   Does not apply q1800   Continuous Infusions:   . sodium chloride 75 mL/hr at 08/01/11 0700  . fat emulsion 240 mL (08/01/11 0700)  . TPN (CLINIMIX) +/- additives 65 mL/hr at 07/30/11 1706  . TPN (CLINIMIX) +/- additives     PRN Meds:.acetaminophen (TYLENOL) oral liquid 160 mg/5 mL, alum & mag hydroxide-simeth,  metoCLOPramide (REGLAN) injection, metoprolol, morphine injection, phenol, sodium chloride  Assessment/Plan:  Ileus: clinically improving continue diet - eating better. Albumin 2.0 3/28 Anemia - Hgb 8.8 Cloudy urine - chk urine culture now if pt becomes sx OOB to chair  Deconditioned hopefully can start increased PT when TPN off CVA with Left hemiparesis Chk labs for TPN as per pharmacy      LOS: 38 days   Ryosuke Ericksen NEVILL 08/01/2011, 7:28 AM

## 2011-08-01 NOTE — Progress Notes (Addendum)
PARENTERAL NUTRITION and ANTICOAGULATION CONSULT - FOLLOW UP  Pharmacy Consult for TNA Indication: SBO  Pharmacy Consult for Heparin/Coumadin Indication: VTE, afib, and r/o CVA  No Known Allergies  Patient Measurements: Height: 5\' 6"  (167.6 cm) Weight: 156 lb 12 oz (71.1 kg) IBW/kg (Calculated) : 63.8  Weight on admit: 162 lb (73kg)  Vital Signs: Temp: 97.7 F (36.5 C) (03/30 0622) Temp src: Oral (03/30 0622) BP: 122/76 mmHg (03/30 0622) Pulse Rate: 110  (03/30 0622)  Intake/Output from previous day: 03/29 0701 - 03/30 0700 In: 3345 [P.O.:714; I.V.:1510; IV Piggyback:1; TPN:600] Out: 2126 [Urine:2125; Stool:1]  Labs:  [facility] 08/01/11 0453 07/31/11 0410 07/30/11 0409  WBC 6.6 -- --  HGB 8.8* -- --  HCT 28.1* -- --  PLT 325 -- --  APTT -- -- --  INR 2.14* 2.06* 2.46*     Basename 08/01/11 0453 07/30/11 0409  NA 132* 133*  K 4.2 4.2  CL 103 104  CO2 20 20  GLUCOSE 117* 115*  BUN 23 23  CREATININE 0.93 0.98  LABCREA -- --  CREAT24HRUR -- --  CALCIUM 8.7 8.6  MG -- 1.9  PHOS -- 3.7  PROT -- 7.6  ALBUMIN -- 2.0*  AST -- 26  ALT -- 19  ALKPHOS -- 158*  BILITOT -- 0.2*  BILIDIR -- --  IBILI -- --  PREALBUMIN -- --  TRIG -- --  CHOLHDL -- --  CHOL -- --   Estimated Creatinine Clearance: 61.9 ml/min (by C-G formula based on Cr of 0.93).   IV Fluid:  NS @ 75 ml/hr  Nutritional Goals:   1850-2110 kCal, 80-95 grams of protein per day  Clinimix E 5/15 at 65 ml/hr + 20% Lipids 77ml/hr will provide 78 gm protein, 1109 kcal on non-lipid days, 1589 kcal on lipid days. With the Boost Plus daily, this provides 92 gm protein and 1949 kcal on the lipid days, 1459 on non-lipid days, average 1669 kcal/week.  Current Nutrition:  Clinimix E 5/15 at 50 ml/hr +/- IV lipids at 56ml/hr on MWF. Boost Plus once daily. Resource Breeze BID. Regular diet, started 3/28  CBGs: 113, 114, 102, 140, 125 - 4 units SSI given  Assessment:  77 YOM s/p ORIF 2/22, GI bleed  2/28 thought to be due to diverticular disease, CT 3/4 consistent with SBO.   POD#22 LOA, serosal/enterotomy repair, fecal disimpaction.  Patient was switched to Clinimix E 5/15 since Clinimix E 5/20 is now of limited reserve Radio broadcast assistant).  Patient appears to be tolerating switch.  Patient has been taking Boost Plus daily, which provides 360 kcal and 14g per serving.  Yesterday, Resource Breeze BID was added (providing 500 kcal and 18 grams of protein daily) and patient was advanced to regular diet.  Lytes: Low Na slightly improved, all other lytes WNL.   Prealbumin: 13.6 (3/18), 21 (3/25)  Lipids: wnl   CBGs: controlled (cbgs<150) with  68 units/2L TNA bag (34 units/L).  CBGs are relatively tightly controlled now and since we will be adjusting the TNA rate with advancing in oral diet/supplements, will reduce insulin in TNA slightly today to be cautious.  Alkaline phosphatase rising: 158 (3/28) << 138 (3/25) << 90 (3/21), will monitor carefully.  Patient's ileus improving, pt is positive for BM's.  Will adjust TNA today to account for extra calories supplemented with Boost Plus and Resource Breeze.  Reducing TNA to Climinix E 5/15 to 62ml/hr with Boost Plus and Resource Cleotis Nipper will provide 92 gm protein and 2192 kcal  on lipid days (today).  MD reports patient is eating well & drinking Boost.  Please let us know when we can start tapering or d/c TNA.  Plan:  Decrease Clinimix E 5/15 to 40 ml/hr. Continue adding insulin to TNA bag, but will reduce to 32 units per liter considering rate change. Fat emulsion at 10 ml/hr, standard multivitamins and trace elements on MWF only due to ongoing shortage. Follow up when to d/c TNA  Loralee Pacas, PharmD, BCPS Pager: [phone] 08/01/2011,8:34 AM  ________________________________________________  ANTICOAGULATION CONSULT NOTE - Follow Up Consult  Labs:  Basename 08/01/11 0453 07/31/11 0410 07/30/11 0409  HGB 8.8* -- --  HCT 28.1* --  --  PLT 325 -- --  APTT -- -- --  LABPROT 24.3* 23.6* 27.1*  INR 2.14* 2.06* 2.46*  HEPARINUNFRC -- -- --  CREATININE 0.93 -- 0.98  CKTOTAL -- -- --  CKMB -- -- --  TROPONINI -- -- --   Estimated Creatinine Clearance: 61.9 ml/min (by C-G formula based on Cr of 0.93).  Assessment:  77 yom on chronic warfarin for history of PE, DVTs, s/p ORIF on 2/22, ex-lap with LOA, enterotomy 3/8, acute CVA 3/9.  Coumadin resumed 3/21.    Heparin discontinued 3/27 when INR>2.  Home Coumadin dose : 2.5mg  MWFSu, 5mg  other days.  INR at low end of goal   No bleeding reported/documented.  H/H low but stable.   Goal of Therapy:  INR 2-3   Plan:   Coumadin 5 mg po x 1 tonight  Daily PT/INR.  Loralee Pacas, PharmD, BCPS Pager: [phone] 08/01/2011, 8:35 AM

## 2011-08-01 NOTE — Progress Notes (Signed)
22 Days Post-Op  Subjective: Bowel issues  Objective: Vital signs in last 24 hours: Temp:  [97.2 F (36.2 C)-97.7 F (36.5 C)] 97.7 F (36.5 C) (03/30 0622) Pulse Rate:  [110-128] 110  (03/30 0622) Resp:  [18] 18  (03/30 0622) BP: (122-152)/(76-89) 122/76 mmHg (03/30 0622) SpO2:  [96 %-98 %] 97 % (03/30 0622) Last BM Date: 07/31/11  Intake/Output from previous day: 03/29 0701 - 03/30 0700 In: 3345 [P.O.:714; I.V.:1510; IV Piggyback:1; TPN:600] Out: 2126 [Urine:2125; Stool:1] Intake/Output this shift: Total I/O In: -  Out: 1 [Stool:1]  abdomen soft non tender wound healed.  Lab Results:   [facility] 08/01/11 0453  WBC 6.6  HGB 8.8*  HCT 28.1*  PLT 325   BMET  Basename 08/01/11 0453 08-20-2011 0409  NA 132* 133*  K 4.2 4.2  CL 103 104  CO2 20 20  GLUCOSE 117* 115*  BUN 23 23  CREATININE 0.93 0.98  CALCIUM 8.7 8.6   PT/INR  Basename 08/01/11 0453 07/31/11 0410  LABPROT 24.3* 23.6*  INR 2.14* 2.06*   ABG No results found for this basename: PHART:2,PCO2:2,PO2:2,HCO3:2 in the last 72 hours  Studies/Results: Dg Abd 2 Views  August 20, 2011  *RADIOLOGY REPORT*  Clinical Data: Small bowel obstruction  ABDOMEN - 2 VIEW  Comparison: July 28, 2011  Findings: The stool and bowel gas pattern is within normal limits with no evidence of obstruction or pneumoperitoneum.  Rectal gas is present.  Side plate and screw fixation of the right hip is anatomically aligned.  Thoricolumbar scoliosis and degenerative changes are again noted.  The visualized lung bases are clear.  IMPRESSION: Normal stool and bowel gas pattern.  Original Report Authenticated By: Brandon Melnick, M.D.    Anti-infectives: Anti-infectives     Start     Dose/Rate Route Frequency Ordered Stop   07/15/11 0900   vancomycin (VANCOCIN) IVPB 1000 mg/200 mL premix  Status:  Discontinued        1,000 mg 200 mL/hr over 60 Minutes Intravenous Every 12 hours 07/15/11 0407 07/16/11 1325   07/12/11 1800   metroNIDAZOLE  (FLAGYL) IVPB 500 mg  Status:  Discontinued        500 mg 100 mL/hr over 60 Minutes Intravenous 4 times per day 07/12/11 1645 07/23/11 0930   07/12/11 1530   vancomycin (VANCOCIN) 500 mg in sodium chloride irrigation 0.9 % 100 mL enema        500 mg 66.7 mL/hr over 90 Minutes Rectal 4 times per day 07/12/11 1450 07/13/11 0108   07/11/11 2359   vancomycin (VANCOCIN) IVPB 1000 mg/200 mL premix  Status:  Discontinued        1,000 mg 200 mL/hr over 60 Minutes Intravenous Every 24 hours 07/11/11 0807 07/15/11 0407   07/10/11 1800   vancomycin (VANCOCIN) 500 mg in sodium chloride 0.9 % 100 mL IVPB  Status:  Discontinued        500 mg 100 mL/hr over 60 Minutes Intravenous Every 12 hours 07/10/11 1641 07/11/11 0806   07/10/11 1800   piperacillin-tazobactam (ZOSYN) IVPB 3.375 g  Status:  Discontinued        3.375 g 12.5 mL/hr over 240 Minutes Intravenous Every 8 hours 07/10/11 1708 07/15/11 0720   07/10/11 0800   ertapenem (INVANZ) 1 g in sodium chloride 0.9 % 50 mL IVPB  Status:  Discontinued        1 g 100 mL/hr over 30 Minutes Intravenous Every 24 hours 07/10/11 0739 07/10/11 1659   06/26/11 2000  ceFAZolin (ANCEF) IVPB 1 g/50 mL premix        1 g 100 mL/hr over 30 Minutes Intravenous Every 6 hours 06/26/11 1658 06/27/11 0857          Assessment/Plan: s/p Procedure(s) (LRB): LAPAROSCOPY DIAGNOSTIC (N/A) LYSIS OF ADHESION (N/A) IMPACTION REMOVAL (N/A) Stop TNA Otherwise needs PT and OT  LOS: 38 days    Rochella Benner A. 08/01/2011

## 2011-08-02 LAB — URINE MICROSCOPIC-ADD ON

## 2011-08-02 LAB — URINALYSIS, ROUTINE W REFLEX MICROSCOPIC
Nitrite: POSITIVE — AB
Protein, ur: NEGATIVE mg/dL
Specific Gravity, Urine: 1.019 (ref 1.005–1.030)
Urobilinogen, UA: 0.2 mg/dL (ref 0.0–1.0)

## 2011-08-02 LAB — PROTIME-INR: INR: 2.43 — ABNORMAL HIGH (ref 0.00–1.49)

## 2011-08-02 MED ORDER — METOCLOPRAMIDE HCL 10 MG PO TABS
10.0000 mg | ORAL_TABLET | Freq: Three times a day (TID) | ORAL | Status: DC
  Administered 2011-08-02 – 2011-08-04 (×7): 10 mg via ORAL
  Filled 2011-08-02 (×11): qty 1

## 2011-08-02 MED ORDER — WARFARIN SODIUM 2.5 MG PO TABS
2.5000 mg | ORAL_TABLET | Freq: Once | ORAL | Status: AC
  Administered 2011-08-02: 2.5 mg via ORAL
  Filled 2011-08-02: qty 1

## 2011-08-02 NOTE — Progress Notes (Signed)
Subjective: Patient ate well yesterday. One loose stool. No abdominal pain no or shortness of breath. Feeling better overall. Out of bed to chair yesterday  Objective: Weight change:   Intake/Output Summary (Last 24 hours) at 08/02/11 0728 Last data filed at 08/02/11 0615  Gross per 24 hour  Intake    885 ml  Output   1801 ml  Net   -916 ml   Filed Vitals:   08/01/11 0622 08/01/11 1400 08/01/11 2151 08/02/11 0613  BP: 122/76 143/87 125/77 139/87  Pulse: 110 109 117 121  Temp: 97.7 F (36.5 C) 97.7 F (36.5 C) 97.5 F (36.4 C) 97.6 F (36.4 C)  TempSrc: Oral Oral Oral Oral  Resp: 18 19 18 18   Height:      Weight:      SpO2: 97% 97% 96% 96%    General Appearance: Alert, cooperative, no distress, appears stated age  Head: Normocephalic, without obvious abnormality, atraumatic  Neck: Supple, symmetrical  Lungs: Clear to auscultation bilaterally, respirations unlabored  Heart: Regular rate and rhythm, S1 and S2 normal, no murmur, rub or gallop, mildly increased rate  Abdomen: Soft, non-tender, bowel sounds active all four quadrants  Extremities: Extremities normal, atraumatic, no cyanosis or edema  Foley: Cloudy urine  Pulses: 2+ and symmetric all extremities  Skin: Skin color, texture, turgor normal, no rashes or lesions  Neuro: CNII-XII intact. Left hemiparesis   Lab Results:  Basename 08/01/11 0453  NA 132*  K 4.2  CL 103  CO2 20  GLUCOSE 117*  BUN 23  CREATININE 0.93  CALCIUM 8.7  MG --  PHOS --   No results found for this basename: AST:2,ALT:2,ALKPHOS:2,BILITOT:2,PROT:2,ALBUMIN:2 in the last 72 hours No results found for this basename: LIPASE:2,AMYLASE:2 in the last 72 hours  Basename 08/01/11 0453  WBC 6.6  NEUTROABS 4.2  HGB 8.8*  HCT 28.1*  MCV 87.0  PLT 325   No results found for this basename: CKTOTAL:3,CKMB:3,CKMBINDEX:3,TROPONINI:3 in the last 72 hours No components found with this basename: POCBNP:3 No results found for this basename:  DDIMER:2 in the last 72 hours No results found for this basename: HGBA1C:2 in the last 72 hours No results found for this basename: CHOL:2,HDL:2,LDLCALC:2,TRIG:2,CHOLHDL:2,LDLDIRECT:2 in the last 72 hours No results found for this basename: TSH,T4TOTAL,FREET3,T3FREE,THYROIDAB in the last 72 hours No results found for this basename: VITAMINB12:2,FOLATE:2,FERRITIN:2,TIBC:2,IRON:2,RETICCTPCT:2 in the last 72 hours  Studies/Results: No results found. Medications: Scheduled Meds:   . antiseptic oral rinse  15 mL Mouth Rinse q12n4p  . lactose free nutrition  237 mL Oral Q1400  . chlorhexidine  15 mL Mouth Rinse BID  . diltiazem  180 mg Oral Daily  . docusate sodium  100 mg Oral BID  . feeding supplement  1 Container Oral BID  . insulin aspart  0-9 Units Subcutaneous TID WC  . lip balm  1 application Topical BID  . metoCLOPramide  10 mg Oral TID AC  . metoprolol tartrate  50 mg Oral BID  . pantoprazole  40 mg Oral Daily  . polyethylene glycol  17 g Oral Daily  . sodium chloride  10-40 mL Intracatheter Q12H  . warfarin  5 mg Oral ONCE-1800  . Warfarin - Pharmacist Dosing Inpatient   Does not apply q1800  . DISCONTD: insulin aspart  0-15 Units Subcutaneous TID AC & HS  . DISCONTD: metoCLOPramide (REGLAN) injection  5 mg Intravenous Q6H   Continuous Infusions:   . DISCONTD: sodium chloride 75 mL/hr at 08/01/11 1928  . DISCONTD: fat emulsion  240 mL (08/01/11 0700)  . DISCONTD: TPN (CLINIMIX) +/- additives    . DISCONTD: TPN (CLINIMIX) +/- additives     PRN Meds:.acetaminophen (TYLENOL) oral liquid 160 mg/5 mL, alum & mag hydroxide-simeth, metoCLOPramide (REGLAN) injection, phenol, sodium chloride, DISCONTD: metoprolol, DISCONTD:  morphine injection  Assessment/Plan:  Ileus: Clinically improving continue diet - eating better. Albumin 2.0 3/28  Anemia - Hgb 8.8 Discontinue monitoring and IV fluids Cloudy urine - chk urine culture now if pt becomes sx  OOB to chair  Deconditioned  hopefully can start increased PT when TPN off  CVA with Left hemiparesis  TPN discontinued. Check CBC in bmet in a.m.     LOS: 39 days   [name] 08/02/2011, 7:28 AM

## 2011-08-02 NOTE — Progress Notes (Signed)
ANTICOAGULATION CONSULT NOTE - Follow Up Consult  Pharmacy Consult for Coumadin Indication: Hx DVTs, PE  No Known Allergies  Patient Measurements: Height: 5\' 6"  (167.6 cm) Weight: 156 lb 12 oz (71.1 kg) IBW/kg (Calculated) : 63.8   Vital Signs: Temp: 97.6 F (36.4 C) (03/31 0613) Temp src: Oral (03/31 0613) BP: 139/87 mmHg (03/31 0613) Pulse Rate: 121  (03/31 0613)  Labs:  Alvira Philips 08/02/11 0655 08/01/11 0453 07/31/11 0410  HGB -- 8.8* --  HCT -- 28.1* --  PLT -- 325 --  APTT -- -- --  LABPROT 26.8* 24.3* 23.6*  INR 2.43* 2.14* 2.06*  HEPARINUNFRC -- -- --  CREATININE -- 0.93 --  CKTOTAL -- -- --  CKMB -- -- --  TROPONINI -- -- --   Estimated Creatinine Clearance: 61.9 ml/min (by C-G formula based on Cr of 0.93).   Medications:  Scheduled:    . lactose free nutrition  237 mL Oral Q1400  . diltiazem  180 mg Oral Daily  . docusate sodium  100 mg Oral BID  . feeding supplement  1 Container Oral BID  . lip balm  1 application Topical BID  . metoCLOPramide  10 mg Oral TID AC  . metoprolol tartrate  50 mg Oral BID  . pantoprazole  40 mg Oral Daily  . polyethylene glycol  17 g Oral Daily  . sodium chloride  10-40 mL Intracatheter Q12H  . warfarin  5 mg Oral ONCE-1800  . Warfarin - Pharmacist Dosing Inpatient   Does not apply q1800  . DISCONTD: antiseptic oral rinse  15 mL Mouth Rinse q12n4p  . DISCONTD: chlorhexidine  15 mL Mouth Rinse BID  . DISCONTD: insulin aspart  0-9 Units Subcutaneous TID WC  . DISCONTD: metoCLOPramide (REGLAN) injection  5 mg Intravenous Q6H    Assessment: 77 yom on chronic warfarin for history of PE, DVTs, s/p ORIF on 2/22, ex-lap with LOA, enterotomy 3/8, acute CVA 3/9. Coumadin resumed 3/21.  Heparin discontinued 3/27 when INR>2.  Home Coumadin dose : 2.5mg  MWFSu, 5mg  other days.  INR in goal No bleeding reported/documented. H/H low but stable.   Goal of Therapy:  INR 2-3   Plan:   Coumadin 2.5mg  today per home regimen  Daily  PT/INR   Loralee Pacas, PharmD, BCPS Pager: [phone] 08/02/2011,1:12 PM

## 2011-08-02 NOTE — Progress Notes (Signed)
Patient ID: [name], male   DOB: 02-11-1934, 78 y.o.   MRN: [id number] [facility] Surgery Progress Note:   23 Days Post-Op  Subjective: Mental status is fairly clear.  More upbeat this am.  No complaints Objective: Vital signs in last 24 hours: Temp:  [97.5 F (36.4 C)-97.7 F (36.5 C)] 97.6 F (36.4 C) (03/31 0613) Pulse Rate:  [109-121] 121  (03/31 0613) Resp:  [18-19] 18  (03/31 0613) BP: (125-143)/(77-87) 139/87 mmHg (03/31 0613) SpO2:  [96 %-97 %] 96 % (03/31 0613)  Intake/Output from previous day: 03/30 0701 - 03/31 0700 In: 885 [P.O.:580; I.V.:300; IV Piggyback:5] Out: 1801 [Urine:1800; Stool:1] Intake/Output this shift: Total I/O In: 360 [P.O.:360] Out: -   Physical Exam: Work of breathing is  Normal.  Pt on bedpan.  Bowels working  Lab Results:  Results for orders placed during the hospital encounter of 06/24/11 (from the past 48 hour(s))  GLUCOSE, CAPILLARY     Status: Abnormal   Collection Time   07/31/11 12:00 PM      Component Value Range Comment   Glucose-Capillary 114 (*) 70 - 99 (mg/dL)   GLUCOSE, CAPILLARY     Status: Abnormal   Collection Time   07/31/11  5:08 PM      Component Value Range Comment   Glucose-Capillary 102 (*) 70 - 99 (mg/dL)   GLUCOSE, CAPILLARY     Status: Abnormal   Collection Time   07/31/11  9:42 PM      Component Value Range Comment   Glucose-Capillary 140 (*) 70 - 99 (mg/dL)   PROTIME-INR     Status: Abnormal   Collection Time   08/01/11  4:53 AM      Component Value Range Comment   Prothrombin Time 24.3 (*) 11.6 - 15.2 (seconds)    INR 2.14 (*) 0.00 - 1.49    CBC     Status: Abnormal   Collection Time   08/01/11  4:53 AM      Component Value Range Comment   WBC 6.6  4.0 - 10.5 (K/uL)    RBC 3.23 (*) 4.22 - 5.81 (MIL/uL)    Hemoglobin 8.8 (*) 13.0 - 17.0 (g/dL)    HCT 40.9 (*) 81.1 - 52.0 (%)    MCV 87.0  78.0 - 100.0 (fL)    MCH 27.2  26.0 - 34.0 (pg)    MCHC 31.3  30.0 - 36.0 (g/dL)    RDW 91.4 (*) 78.2 - 15.5  (%)    Platelets 325  150 - 400 (K/uL)   DIFFERENTIAL     Status: Normal   Collection Time   08/01/11  4:53 AM      Component Value Range Comment   Neutrophils Relative 64  43 - 77 (%)    Neutro Abs 4.2  1.7 - 7.7 (K/uL)    Lymphocytes Relative 22  12 - 46 (%)    Lymphs Abs 1.4  0.7 - 4.0 (K/uL)    Monocytes Relative 10  3 - 12 (%)    Monocytes Absolute 0.7  0.1 - 1.0 (K/uL)    Eosinophils Relative 4  0 - 5 (%)    Eosinophils Absolute 0.3  0.0 - 0.7 (K/uL)    Basophils Relative 0  0 - 1 (%)    Basophils Absolute 0.0  0.0 - 0.1 (K/uL)   BASIC METABOLIC PANEL     Status: Abnormal   Collection Time   08/01/11  4:53 AM      Component  Value Range Comment   Sodium 132 (*) 135 - 145 (mEq/L)    Potassium 4.2  3.5 - 5.1 (mEq/L)    Chloride 103  96 - 112 (mEq/L)    CO2 20  19 - 32 (mEq/L)    Glucose, Bld 117 (*) 70 - 99 (mg/dL)    BUN 23  6 - 23 (mg/dL)    Creatinine, Ser 0.86  0.50 - 1.35 (mg/dL)    Calcium 8.7  8.4 - 10.5 (mg/dL)    GFR calc non Af Amer 80 (*) >90 (mL/min)    GFR calc Af Amer >90  >90 (mL/min)   GLUCOSE, CAPILLARY     Status: Abnormal   Collection Time   08/01/11  8:01 AM      Component Value Range Comment   Glucose-Capillary 126 (*) 70 - 99 (mg/dL)   GLUCOSE, CAPILLARY     Status: Abnormal   Collection Time   08/01/11 11:45 AM      Component Value Range Comment   Glucose-Capillary 127 (*) 70 - 99 (mg/dL)   GLUCOSE, CAPILLARY     Status: Abnormal   Collection Time   08/01/11  4:35 PM      Component Value Range Comment   Glucose-Capillary 121 (*) 70 - 99 (mg/dL)   GLUCOSE, CAPILLARY     Status: Abnormal   Collection Time   08/01/11 10:12 PM      Component Value Range Comment   Glucose-Capillary 108 (*) 70 - 99 (mg/dL)   PROTIME-INR     Status: Abnormal   Collection Time   08/02/11  6:55 AM      Component Value Range Comment   Prothrombin Time 26.8 (*) 11.6 - 15.2 (seconds)    INR 2.43 (*) 0.00 - 1.49    URINALYSIS, ROUTINE W REFLEX MICROSCOPIC     Status:  Abnormal   Collection Time   08/02/11  7:56 AM      Component Value Range Comment   Color, Urine YELLOW  YELLOW     APPearance TURBID (*) CLEAR     Specific Gravity, Urine 1.019  1.005 - 1.030     pH 5.0  5.0 - 8.0     Glucose, UA NEGATIVE  NEGATIVE (mg/dL)    Hgb urine dipstick TRACE (*) NEGATIVE     Bilirubin Urine NEGATIVE  NEGATIVE     Ketones, ur NEGATIVE  NEGATIVE (mg/dL)    Protein, ur NEGATIVE  NEGATIVE (mg/dL)    Urobilinogen, UA 0.2  0.0 - 1.0 (mg/dL)    Nitrite POSITIVE (*) NEGATIVE     Leukocytes, UA LARGE (*) NEGATIVE    URINE MICROSCOPIC-ADD ON     Status: Abnormal   Collection Time   08/02/11  7:56 AM      Component Value Range Comment   WBC, UA TOO NUMEROUS TO COUNT  <3 (WBC/hpf)    Bacteria, UA MANY (*) RARE      Radiology/Results: No results found.  Anti-infectives: Anti-infectives     Start     Dose/Rate Route Frequency Ordered Stop   07/15/11 0900   vancomycin (VANCOCIN) IVPB 1000 mg/200 mL premix  Status:  Discontinued        1,000 mg 200 mL/hr over 60 Minutes Intravenous Every 12 hours 07/15/11 0407 07/16/11 1325   07/12/11 1800   metroNIDAZOLE (FLAGYL) IVPB 500 mg  Status:  Discontinued        500 mg 100 mL/hr over 60 Minutes Intravenous 4 times per day 07/12/11 1645  07/23/11 0930   07/12/11 1530   vancomycin (VANCOCIN) 500 mg in sodium chloride irrigation 0.9 % 100 mL enema        500 mg 66.7 mL/hr over 90 Minutes Rectal 4 times per day 07/12/11 1450 07/13/11 0108   07/11/11 2359   vancomycin (VANCOCIN) IVPB 1000 mg/200 mL premix  Status:  Discontinued        1,000 mg 200 mL/hr over 60 Minutes Intravenous Every 24 hours 07/11/11 0807 07/15/11 0407   07/10/11 1800   vancomycin (VANCOCIN) 500 mg in sodium chloride 0.9 % 100 mL IVPB  Status:  Discontinued        500 mg 100 mL/hr over 60 Minutes Intravenous Every 12 hours 07/10/11 1641 07/11/11 0806   07/10/11 1800   piperacillin-tazobactam (ZOSYN) IVPB 3.375 g  Status:  Discontinued        3.375  g 12.5 mL/hr over 240 Minutes Intravenous Every 8 hours 07/10/11 1708 07/15/11 0720   07/10/11 0800   ertapenem (INVANZ) 1 g in sodium chloride 0.9 % 50 mL IVPB  Status:  Discontinued        1 g 100 mL/hr over 30 Minutes Intravenous Every 24 hours 07/10/11 0739 07/10/11 1659   06/26/11 2000   ceFAZolin (ANCEF) IVPB 1 g/50 mL premix        1 g 100 mL/hr over 30 Minutes Intravenous Every 6 hours 06/26/11 1658 06/27/11 0857          Assessment/Plan: Problem List: Patient Active Problem List  Diagnoses  . PE (pulmonary embolism)  . Chronic anticoagulation  . Fall from standing  . Fracture of hip, right, closed  . Peripheral arterial disease  . SBO (small bowel obstruction)  . Fecal impaction in rectum  . Chronic constipation  . Acute respiratory failure with hypoxia  . Tachycardia  . A-fib    Long term care.  Slow improvement after very significant deconditioning.   23 Days Post-Op    LOS: 39 days   Matt B. Daphine Deutscher, MD, [facility] [phone] beeper [phone]  08/02/2011 10:24 AM

## 2011-08-02 NOTE — Progress Notes (Signed)
Pt declines turn and reposition at this time. Pt states "I would rather stay like this on my back". Will monitor.

## 2011-08-03 ENCOUNTER — Other Ambulatory Visit: Payer: Self-pay

## 2011-08-03 LAB — PROTIME-INR
INR: 2.71 — ABNORMAL HIGH (ref 0.00–1.49)
Prothrombin Time: 29.2 seconds — ABNORMAL HIGH (ref 11.6–15.2)

## 2011-08-03 LAB — BASIC METABOLIC PANEL
BUN: 18 mg/dL (ref 6–23)
Chloride: 104 mEq/L (ref 96–112)
Creatinine, Ser: 1.02 mg/dL (ref 0.50–1.35)
GFR calc Af Amer: 81 mL/min — ABNORMAL LOW (ref >90)
GFR calc non Af Amer: 70 mL/min — ABNORMAL LOW (ref >90)
Potassium: 3.9 mEq/L (ref 3.5–5.1)

## 2011-08-03 LAB — CBC
HCT: 29.9 % — ABNORMAL LOW (ref 39.0–52.0)
Hemoglobin: 9.4 g/dL — ABNORMAL LOW (ref 13.0–17.0)
MCHC: 31.4 g/dL (ref 30.0–36.0)
RDW: 17.2 % — ABNORMAL HIGH (ref 11.5–15.5)
WBC: 7.6 10*3/uL (ref 4.0–10.5)

## 2011-08-03 LAB — DIFFERENTIAL
Basophils Absolute: 0 10*3/uL (ref 0.0–0.1)
Eosinophils Relative: 3 % (ref 0–5)
Lymphocytes Relative: 24 % (ref 12–46)
Lymphs Abs: 1.8 10*3/uL (ref 0.7–4.0)
Neutro Abs: 4.9 10*3/uL (ref 1.7–7.7)
Neutrophils Relative %: 64 % (ref 43–77)

## 2011-08-03 MED ORDER — WARFARIN SODIUM 2.5 MG PO TABS
2.5000 mg | ORAL_TABLET | Freq: Once | ORAL | Status: AC
  Administered 2011-08-03: 2.5 mg via ORAL
  Filled 2011-08-03: qty 1

## 2011-08-03 MED ORDER — METOPROLOL TARTRATE 50 MG PO TABS
50.0000 mg | ORAL_TABLET | Freq: Two times a day (BID) | ORAL | Status: DC
  Administered 2011-08-03 – 2011-08-04 (×3): 50 mg via ORAL
  Filled 2011-08-03 (×6): qty 1

## 2011-08-03 NOTE — Progress Notes (Signed)
CSW assisting with D/C planning. PN reviewed . Pt appears to be improving / nearing d/c. Met with pt / spouse and spoke with daughter, Jasmine December regarding d/c planning. [facility] contacted and PT notes sent to begin [facility] authorization process. OT eval also needed and will be sent to SNF once completed. Pt is looking forward to D/C to [facility] rehab.  Will follow to assist with d/c planning to SNF when stable.  Cori Razor  LCSW  [phone]

## 2011-08-03 NOTE — Progress Notes (Signed)
The patient interviewed and examined. I agree with evaluation and care plan as outlined by Mr. Marlyne Beards, [place] and by Dr. [name].  The patient is tolerating a diet and is moving his bowels.  Exam reveals abdomen is soft and nontender and his incision is well-healed.  Assessment and plan: I agree that he is ready for transition to skilled nursing facility.   Angelia Mould. Derrell Lolling, M.D., [facility] General and Minimally invasive Surgery Breast and Colorectal Surgery Office:   [phone] Pager:   [phone]

## 2011-08-03 NOTE — Progress Notes (Signed)
Subjective: [name] has no complaints. Ate well yesterday and had 2 bowel movements that were slightly loose. No abdominal pain  Objective: Weight change:   Intake/Output Summary (Last 24 hours) at 08/03/11 0727 Last data filed at 08/03/11 0500  Gross per 24 hour  Intake   1077 ml  Output   2251 ml  Net  -1174 ml   Filed Vitals:   08/02/11 0613 08/02/11 1348 08/02/11 2106 08/03/11 0511  BP: 139/87 124/80 130/81 138/85  Pulse: 121 106 112 118  Temp: 97.6 F (36.4 C) 98.5 F (36.9 C) 97.8 F (36.6 C) 97.8 F (36.6 C)  TempSrc: Oral Axillary Oral Oral  Resp: 18 19 18 18   Height:      Weight:      SpO2: 96% 98% 96% 96%    General Appearance: Alert, cooperative, no distress, appears stated age  Head: Normocephalic, without obvious abnormality, atraumatic  Neck: Supple, symmetrical  Lungs: Clear to auscultation bilaterally, respirations unlabored  Heart: Regular rate and rhythm, S1 and S2 normal, no murmur, rub or gallop, mildly increased rate  Abdomen: Soft, non-tender, bowel sounds active all four quadrants  Extremities: Extremities normal, atraumatic, no cyanosis or edema  Foley: Cloudy urine  Pulses: 2+ and symmetric all extremities  Skin: Skin color, texture, turgor normal, no rashes or lesions  Neuro: CNII-XII intact. Left hemiparesis   Lab Results:  Basename 08/03/11 0500 08/01/11 0453  NA 134* 132*  K 3.9 4.2  CL 104 103  CO2 20 20  GLUCOSE 116* 117*  BUN 18 23  CREATININE 1.02 0.93  CALCIUM 8.9 8.7  MG -- --  PHOS -- --   No results found for this basename: AST:2,ALT:2,ALKPHOS:2,BILITOT:2,PROT:2,ALBUMIN:2 in the last 72 hours No results found for this basename: LIPASE:2,AMYLASE:2 in the last 72 hours  Basename 08/03/11 0500 08/01/11 0453  WBC 7.6 6.6  NEUTROABS 4.9 4.2  HGB 9.4* 8.8*  HCT 29.9* 28.1*  MCV 86.2 87.0  PLT 350 325   No results found for this basename: CKTOTAL:3,CKMB:3,CKMBINDEX:3,TROPONINI:3 in the last 72 hours No components  found with this basename: POCBNP:3 No results found for this basename: DDIMER:2 in the last 72 hours No results found for this basename: HGBA1C:2 in the last 72 hours No results found for this basename: CHOL:2,HDL:2,LDLCALC:2,TRIG:2,CHOLHDL:2,LDLDIRECT:2 in the last 72 hours No results found for this basename: TSH,T4TOTAL,FREET3,T3FREE,THYROIDAB in the last 72 hours No results found for this basename: VITAMINB12:2,FOLATE:2,FERRITIN:2,TIBC:2,IRON:2,RETICCTPCT:2 in the last 72 hours  Studies/Results: No results found. Medications: Scheduled Meds:   . lactose free nutrition  237 mL Oral Q1400  . diltiazem  180 mg Oral Daily  . docusate sodium  100 mg Oral BID  . feeding supplement  1 Container Oral BID  . lip balm  1 application Topical BID  . metoCLOPramide  10 mg Oral TID AC  . metoprolol tartrate  50 mg Oral BID  . pantoprazole  40 mg Oral Daily  . polyethylene glycol  17 g Oral Daily  . sodium chloride  10-40 mL Intracatheter Q12H  . warfarin  2.5 mg Oral ONCE-1800  . Warfarin - Pharmacist Dosing Inpatient   Does not apply q1800  . DISCONTD: antiseptic oral rinse  15 mL Mouth Rinse q12n4p  . DISCONTD: chlorhexidine  15 mL Mouth Rinse BID  . DISCONTD: insulin aspart  0-9 Units Subcutaneous TID WC   Continuous Infusions:  PRN Meds:.acetaminophen (TYLENOL) oral liquid 160 mg/5 mL, alum & mag hydroxide-simeth, metoCLOPramide (REGLAN) injection, phenol, sodium chloride  Assessment/Plan:  Ileus:  Resolved Anemia - Hgb 8.8, now up to 9.4  Cloudy urine - urinalysis consistent with UTI. Patient has unexplained tachycardia. Hopefully he is not developing symptoms from his UTI. White count is normal  OOB to chair and continue PT OT  CVA with Left hemiparesis, left upper extremity worse than left lower extremity  Tachycardia - increase metoprolol to 75 mg twice a day and check EKG and thyroid function Disposition: Will need skilled nursing facility placement with prolonged rehabilitation.  I would be happy to follow him at [facility] nursing facility   LOS: 40 days   [name] 08/03/2011, 7:27 AM

## 2011-08-03 NOTE — Progress Notes (Signed)
Physical Therapy Treatment Patient Details Name: [name] MRN: [id number] DOB: 1933/06/21 Today's Date: 08/03/2011  11:00 - 11:35 2 ta  PT Assessment/Plan  PT - Assessment/Plan Comments on Treatment Session:  Son present and assisted.  Assisted pt OOB + 2 assist.  Attempted standing activity using Fara Boros and L knee KI.  Pt required + 3 assist to proper positioning of L UE, L LE and trunk extension.  Pt plans to D/C to SNF for Rehab. PT Plan: Discharge plan remains appropriate Follow Up Recommendations: Skilled nursing facility Equipment Recommended: Defer to next venue PT Goals   See LPT Re Eval  PT Treatment Precautions/Restrictions  Precautions Precautions: Fall Precaution Comments: Left hemiparesis CVA and R ORIF WBAT Required Braces or Orthoses: No Restrictions Weight Bearing Restrictions: No RLE Weight Bearing: Weight bearing as tolerated Other Position/Activity Restrictions: Pt aware he is WBAT Mobility (including Balance) Bed Mobility Bed Mobility: Yes Rolling Right: 1: +2 Total assist Rolling Right Details (indicate cue type and reason): Total assist pt 10% Supine to Sit: 1: +2 Total assist Supine to Sit Details (indicate cue type and reason): Total Assist = 2 pt 10% Transfers Transfers: Yes Sit to Stand: 1: +2 Total assist;From bed;From elevated surface Sit to Stand Details (indicate cue type and reason): Total assist + 2 pt 10% using Eva walker and L KI for kneed buckle Stand to Sit: 1: +2 Total assist Stand to Sit Details: Total Assist + 2 pt 10% Stand Pivot Transfers: 1: +2 Total assist Stand Pivot Transfer Details (indicate cue type and reason): Total Assist + 2 pt 5% with total trunk support by therapist to encage upright posture of trunk extension and hip extension.  Pt unable to fully support self or weight shift as he transfered from bed to chair. Ambulation/Gait Ambulation/Gait: No (bed to chair transfer only) Assistive device: Engineer, maintenance (IT): No  Posture/Postural Control Posture/Postural Control: Postural limitations Balance Balance Assessed: No Static Sitting Balance Static Sitting - Balance Support: Left upper extremity supported Static Sitting - Level of Assistance: 1: +1 Total assist Dynamic Sitting Balance Dynamic Sitting - Balance Support: Left upper extremity supported Dynamic Sitting - Level of Assistance: 1: +2 Total assist Reach (Patient is able to reach ___ inches to right, left, forward, back): able to reach to left approx 6 inches with total assist for support Dynamic Sitting - Balance Activities: Reaching across midline;Lateral lean/weight shifting;Head control activities;Trunk control activities;Reaching for objects    End of Session PT - End of Session Equipment Utilized During Treatment: Gait belt;Left knee immobilizer Activity Tolerance: Patient limited by fatigue Patient left: in chair;with call bell in reach;with family/visitor present General Behavior During Session: [facility] for tasks performed Cognition: [facility] for tasks performed  Felecia Shelling  PTA [facility]  Acute  Rehab Pager     [phone]

## 2011-08-03 NOTE — Progress Notes (Signed)
24 Days Post-Op  Subjective: Tolerating regular diet, 1-2 soft stools per day, Incision is still sore taking tylenol for it.    Objective: Vital signs in last 24 hours: Temp:  [97.8 F (36.6 C)-98.5 F (36.9 C)] 97.8 F (36.6 C) (04/01 0850) Pulse Rate:  [106-128] 128  (04/01 0850) Resp:  [18-19] 18  (04/01 0850) BP: (118-138)/(76-85) 118/76 mmHg (04/01 0850) SpO2:  [95 %-98 %] 95 % (04/01 0850) Last BM Date: 08/02/11 Regular diet; Single bowel movement recorded yesterday, and daily for last 5 dasys/ afebrile/ VSS   Intake/Output from previous day: 03/31 0701 - 04/01 0700 In: 1077 [P.O.:1077] Out: 2251 [Urine:2250; Stool:1] Intake/Output this shift:    General appearance: alert, cooperative and no distress GI: bowel sounds normal; no masses,  no organomegaly and still slightly distended, but GI tract appears to be working well.  Incision looks good and well healed.  Lab Results:   Basename 08/03/11 0500 08/01/11 0453  WBC 7.6 6.6  HGB 9.4* 8.8*  HCT 29.9* 28.1*  PLT 350 325    BMET  Basename 08/03/11 0500 08/01/11 0453  NA 134* 132*  K 3.9 4.2  CL 104 103  CO2 20 20  GLUCOSE 116* 117*  BUN 18 23  CREATININE 1.02 0.93  CALCIUM 8.9 8.7   PT/INR  Basename 08/03/11 0500 08/02/11 0655  LABPROT 29.2* 26.8*  INR 2.71* 2.43*     Lab 07/30/11 0409  AST 26  ALT 19  ALKPHOS 158*  BILITOT 0.2*  PROT 7.6  ALBUMIN 2.0*     Lipase     Component Value Date/Time   LIPASE 52 07/04/2011 0919     Studies/Results: No results found.  Medications:    . lactose free nutrition  237 mL Oral Q1400  . diltiazem  180 mg Oral Daily  . docusate sodium  100 mg Oral BID  . feeding supplement  1 Container Oral BID  . lip balm  1 application Topical BID  . metoCLOPramide  10 mg Oral TID AC  . metoprolol tartrate  50 mg Oral BID  . pantoprazole  40 mg Oral Daily  . polyethylene glycol  17 g Oral Daily  . sodium chloride  10-40 mL Intracatheter Q12H  . warfarin  2.5 mg  Oral ONCE-1800  . Warfarin - Pharmacist Dosing Inpatient   Does not apply q1800  . DISCONTD: antiseptic oral rinse  15 mL Mouth Rinse q12n4p  . DISCONTD: chlorhexidine  15 mL Mouth Rinse BID  . DISCONTD: metoprolol tartrate  50 mg Oral BID    Assessment/Plan SBO (small bowel obstruction) s/p expl lap, LOA, repair of enterotomy on 3/8-had prolonged postop ileus which is slowly resolving; on liquid diet.  C. Diff Colits 07/11/11, just finished flagyl.  PE (pulmonary embolism)  Chronic anticoagulation  Fall from standing  Fracture of hip, right, closed  Fecal impaction in rectum  Chronic constipation  Acute respiratory failure with hypoxia  Tachycardia  A-fib  Plan;  At this point no further recommendations from surgical standpoint. He has follow up instruction for Dr.Gross, in [facility].  He reports plans for rehab, to recover from Stroke when his acute hospitalization is complete.    LOS: 40 days    [name] 08/03/2011

## 2011-08-03 NOTE — Progress Notes (Addendum)
ANTICOAGULATION CONSULT NOTE - Follow Up Consult  Pharmacy Consult for Warfarin Indication: Hx DVTs, PE  No Known Allergies  Patient Measurements: Height: 5\' 6"  (167.6 cm) Weight: 156 lb 12 oz (71.1 kg) IBW/kg (Calculated) : 63.8   Vital Signs: Temp: 97.8 F (36.6 C) (04/01 0850) Temp src: Oral (04/01 0850) BP: 118/76 mmHg (04/01 0850) Pulse Rate: 128  (04/01 0850)  Labs:  Basename 08/03/11 0500 08/02/11 0655 08/01/11 0453  HGB 9.4* -- 8.8*  HCT 29.9* -- 28.1*  PLT 350 -- 325  APTT -- -- --  LABPROT 29.2* 26.8* 24.3*  INR 2.71* 2.43* 2.14*  HEPARINUNFRC -- -- --  CREATININE 1.02 -- 0.93  CKTOTAL -- -- --  CKMB -- -- --  TROPONINI -- -- --   Estimated Creatinine Clearance: 56.5 ml/min (by C-G formula based on Cr of 1.02).   Medications:  Scheduled:     . lactose free nutrition  237 mL Oral Q1400  . diltiazem  180 mg Oral Daily  . docusate sodium  100 mg Oral BID  . feeding supplement  1 Container Oral BID  . lip balm  1 application Topical BID  . metoCLOPramide  10 mg Oral TID AC  . metoprolol tartrate  50 mg Oral BID  . pantoprazole  40 mg Oral Daily  . polyethylene glycol  17 g Oral Daily  . sodium chloride  10-40 mL Intracatheter Q12H  . warfarin  2.5 mg Oral ONCE-1800  . Warfarin - Pharmacist Dosing Inpatient   Does not apply q1800  . DISCONTD: antiseptic oral rinse  15 mL Mouth Rinse q12n4p  . DISCONTD: chlorhexidine  15 mL Mouth Rinse BID  . DISCONTD: metoprolol tartrate  50 mg Oral BID    Assessment: 77 yom on chronic warfarin for history of PE, DVTs, s/p ORIF on 2/22, ex-lap with LOA, enterotomy 3/8, acute CVA 3/9. Coumadin resumed 3/21.  Heparin discontinued 3/27 when INR>2.  Home Coumadin dose : 2.5mg  MWFSu, 5mg  other days.  INR in goal No bleeding reported/documented. H/H low but stable.    Goal of Therapy:  INR 2-3   Plan:   Coumadin 2.5mg  today per home regimen  Daily PT/INR   Lynann Beaver PharmD, BCPS Pager  [phone] 08/03/2011 10:07 AM

## 2011-08-04 LAB — DIFFERENTIAL
Basophils Absolute: 0 10*3/uL (ref 0.0–0.1)
Basophils Relative: 1 % (ref 0–1)
Lymphocytes Relative: 29 % (ref 12–46)
Neutro Abs: 3.7 10*3/uL (ref 1.7–7.7)
Neutrophils Relative %: 57 % (ref 43–77)

## 2011-08-04 LAB — URINE CULTURE

## 2011-08-04 LAB — BASIC METABOLIC PANEL
CO2: 20 mEq/L (ref 19–32)
Chloride: 105 mEq/L (ref 96–112)
GFR calc Af Amer: 75 mL/min — ABNORMAL LOW (ref >90)
Potassium: 4 mEq/L (ref 3.5–5.1)
Sodium: 136 mEq/L (ref 135–145)

## 2011-08-04 LAB — CBC
MCHC: 31.1 g/dL (ref 30.0–36.0)
RDW: 17.6 % — ABNORMAL HIGH (ref 11.5–15.5)
WBC: 6.6 10*3/uL (ref 4.0–10.5)

## 2011-08-04 LAB — TSH: TSH: 1.059 u[IU]/mL (ref 0.350–4.500)

## 2011-08-04 LAB — PROTIME-INR
INR: 3.02 — ABNORMAL HIGH (ref 0.00–1.49)
Prothrombin Time: 31.8 seconds — ABNORMAL HIGH (ref 11.6–15.2)

## 2011-08-04 MED ORDER — METOCLOPRAMIDE HCL 10 MG PO TABS: 10.0000 mg | ORAL_TABLET | Freq: Three times a day (TID) | ORAL | Status: DC

## 2011-08-04 MED ORDER — BOOST PLUS PO LIQD: 237.0000 mL | Freq: Three times a day (TID) | ORAL | Status: DC

## 2011-08-04 MED ORDER — METOPROLOL TARTRATE 50 MG PO TABS: 50.0000 mg | ORAL_TABLET | Freq: Two times a day (BID) | ORAL | Status: DC

## 2011-08-04 MED ORDER — CIPROFLOXACIN HCL 250 MG PO TABS: 250.0000 mg | ORAL_TABLET | Freq: Two times a day (BID) | ORAL | Status: AC

## 2011-08-04 NOTE — Progress Notes (Signed)
Pt plans to d/c to [facility] today via [facility] transport for [facility] placement. [employer] has provided prior authorization for placement. Pt/family are aware of d/c and are in agreement with plan.  Cori Razor  lCSW  [phone]

## 2011-08-04 NOTE — Evaluation (Signed)
Occupational Therapy Evaluation Patient Details Name: [name] MRN: [id number] DOB: 02-26-1934 Today's Date: 08/04/2011  Problem List:  Patient Active Problem List  Diagnoses  . PE (pulmonary embolism)  . Chronic anticoagulation  . Fall from standing  . Fracture of hip, right, closed  . Peripheral arterial disease  . SBO (small bowel obstruction)  . Fecal impaction in rectum  . Chronic constipation  . Acute respiratory failure with hypoxia  . Tachycardia  . A-fib    Past Medical History:  Past Medical History  Diagnosis Date  . Asthma   . Hypertension   . Pulmonary embolism   . Peripheral artery disease   . Hypercholesteremia   . Bursitis of right shoulder   . DVT of leg (deep venous thrombosis)    Past Surgical History:  Past Surgical History  Procedure Date  . Hernia repair   . Appendectomy   . Femoral-femoral bypass graft   . Iliac artery stent   . Esophagogastroduodenoscopy 07/03/2011    Procedure: ESOPHAGOGASTRODUODENOSCOPY (EGD);  Surgeon: Shirley Friar, MD;  Location: [facility];  Service: Endoscopy;  Laterality: N/A;  . Compression hip screw 06/26/2011    Procedure: COMPRESSION HIP;  Surgeon: Raymon Mutton, MD;  Location: [facility];  Service: Orthopedics;  Laterality: Right;  . Laparoscopy 07/10/2011    Procedure: LAPAROSCOPY DIAGNOSTIC;  Surgeon: Ardeth Sportsman, MD;  Location: [facility];  Service: General;  Laterality: N/A;  serousal repair,    OT Assessment/Plan/Recommendation OT Assessment Clinical Impression Statement: This 78 year old male was has a complex medical history including PE, respiratory failure, R hip fx on 06/26/11 managed with ORIF and CVA with L hemiparesis.  He is appropriate for skilled OT to increase postural control to increase independence with ADLs and transfers.  Goal is 1 caregiver level of support in acute.   OT Recommendation/Assessment: Patient will need skilled OT in the acute care venue OT Problem List: Decreased range of  motion;Decreased strength;Impaired balance (sitting and/or standing);Pain OT Therapy Diagnosis : Hemiplegia non-dominant side OT Plan OT Frequency: Min 2X/week OT Treatment/Interventions: Self-care/ADL training;Neuromuscular education;Therapeutic exercise;Energy conservation;Therapeutic activities;Patient/family education;Balance training OT Recommendation Follow Up Recommendations: Skilled nursing facility Equipment Recommended: Defer to next venue Individuals Consulted Consulted and Agree with Results and Recommendations: Patient;Family member/caregiver Family Member Consulted: son [name] OT Goals Acute Rehab OT Goals OT Goal Formulation: With patient Time For Goal Achievement: 2 weeks ADL Goals Pt Will Perform Upper Body Bathing: with supervision;Sitting, chair;Supported;Sitting, edge of bed ADL Goal: Upper Body Bathing - Progress: Goal set today Pt Will Perform Lower Body Bathing: with mod assist;Supine, rolling right and/or left;with adaptive equipment ADL Goal: Lower Body Bathing - Progress: Goal set today Pt Will Perform Upper Body Dressing: with supervision;Sitting, chair;Sitting, bed;Supported;Other (comment) (including tying) ADL Goal: Upper Body Dressing - Progress: Goal set today Pt Will Transfer to Toilet: with max assist;Squat pivot transfer;Drop arm 3-in-1 Miscellaneous OT Goals Miscellaneous OT Goal #1: pt will incorporate LUE into active role in bil tasks without cues OT Goal: Miscellaneous Goal #1 - Progress: Goal set today Miscellaneous OT Goal #2: pt will maintain head in neutral during siitting tasks for 1 minute without cues OT Goal: Miscellaneous Goal #2 - Progress: Goal set today Miscellaneous OT Goal #3: pt will sit unsupported EOB with min guard for 30 seconds near neutral in prep for seated ADLs OT Goal: Miscellaneous Goal #3 - Progress: Goal set today  OT Evaluation Precautions/Restrictions  Precautions Precautions: Fall Precaution Comments: Left  hemiparesis CVA and R  ORIF WBAT Required Braces or Orthoses: No Restrictions Weight Bearing Restrictions: No RLE Weight Bearing: Weight bearing as tolerated Other Position/Activity Restrictions: Pt aware he is WBAT Prior Functioning Home Living Additional Comments: plans STSNF.  Son [name] in room and very involved in learning   ADL ADL Eating/Feeding: Simulated;Supervision/safety Eating/Feeding Details (indicate cue type and reason): modified diet Where Assessed - Eating/Feeding: Bed level Grooming: Performed;Set up;Wash/dry hands Grooming Details (indicate cue type and reason): uses Bil UEs; cued to increase demands on L Where Assessed - Grooming: Supine, head of bed up Upper Body Bathing: Simulated;Minimal assistance Where Assessed - Upper Body Bathing: Supine, head of bed up Lower Body Bathing: Simulated;+2 Total assistance;Comment for patient % (20) Where Assessed - Lower Body Bathing: Rolling right and/or left Upper Body Dressing: Simulated;Minimal assistance Where Assessed - Upper Body Dressing: Supine, head of bed up Lower Body Dressing: Simulated;+2 Total assistance;Comment for patient % (0) Where Assessed - Lower Body Dressing: Rolling right and/or left Toilet Transfer: Not assessed Toileting - Clothing Manipulation: Simulated;+2 Total assistance;Comment for patient % (0) Where Assessed - Toileting Clothing Manipulation: Rolling right and/or left Toileting - Hygiene: Simulated;+2 Total assistance;Comment for patient % (25) Where Assessed - Toileting Hygiene: Rolling right and/or left Tub/Shower Transfer: Not assessed Ambulation Related to ADLs: unable.   Sat EOB with min guard (briefly about 10 seconds) to mod A and max multimodal cues for trunk control.  Overuses RUE for support; midline shift slightly to L ADL Comments: Educated pt/son on optimizing LUE involvement in Bil task; AROM for shoulder and neck (including holding for stretch) Vision/Perception  Vision -  History Visual History:  (slightly dysconjugate:  pt reports no visual problems) Patient Visual Report: No change from baseline Perception Perception: Within Functional Limits Praxis Praxis: Intact Cognition Cognition Arousal/Alertness: Awake/alert Overall Cognitive Status: Appears within functional limits for tasks assessed Orientation Level: Oriented X4 Sensation/Coordination Sensation Light Touch: Appears Intact Extremity Assessment RUE Assessment RUE Assessment:has bursitis (pt is R dominant) LUE Assessment LUE Assessment: Exceptions to WFL (AROM to 90; cannot sustain long; AAROM WFLs) Mobility  Bed Mobility Bed Mobility: Yes Rolling Right: 1: +2 Total assist;Patient percentage (comment);With rail (10) Rolling Left: 1: +2 Total assist;Patient percentage (comment);With rail (30) Supine to Sit: 1: +2 Total assist;Patient percentage (comment) (10) Exercises   End of Session OT - End of Session Activity Tolerance: Patient limited by fatigue Patient left: in bed;with call bell in reach;with family/visitor present General Behavior During Session: [facility] for tasks performed Cognition: [facility] for tasks performed [facility], OTR/L [phone] 08/04/2011  Xue Low 08/04/2011, 10:25 AM

## 2011-08-04 NOTE — Discharge Summary (Signed)
Physician Discharge Summary  Patient ID: [name] MRN: [id number] DOB/AGE: [birth year] 78 y.o.  Admit date: 06/24/2011 Discharge date: 08/04/2011  Admission Diagnoses: Right hip fracture  Discharge Diagnoses:  Active Problems:  PE (pulmonary embolism)  Chronic anticoagulation  Fall from standing  Fracture of hip, right, closed  SBO (small bowel obstruction)  Fecal impaction in rectum  Chronic constipation  Acute respiratory failure with hypoxia  Tachycardia  A-fib ileus GI bleed CVA with left hemiparesis Malnutrition Deconditioning Clostridium difficile, treated UTI, culture showing Enterobacter, required 5 days of treatment Cipro 250 b.i.d. 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 He underwent surgery by Dr. Valentina Gu. Patient's postop course was complicated by several problems, he first developed an ileus, subsequent fecal impaction subsequent GI bleed which required him to be transferred to the ICU. His GI bleed necessitated reversal of his anticoagulation. Patient did receive packed red blood cells and also underwent EGD on March 1 which was unrevealing of a source. He had a colonoscopy in the past which only revealed diverticulosis. Patient did not have any recurrence of the GI bleed. Patient ileus/fecal impaction persistent, surgery was consulted . Ultimately patient had a CT of the abdomen and pelvis which showed small bowel obstruction, patient failed conservative management. Lysis of adhesions was required. Shortly after surgery patient had signs of left hemiparesis, IV heparin was started imaging obtained included carotid Doppler, CT angiogram of the neck which suggested chronically occluded right ICA and also CT of the [name] x2. Patient's postop course also was complicated by  Clostridium difficile which was treated. Patient also had sinus tachycardia. He also had a prolonged ileus after his surgery. He was continued on TNA and bowel rest for some time. Patient slowly began to have resumption of bowel function, at that time p.o. Intake was resumed. The patient's tolerance of diet was very slow in the beginning but ultimately had resolution of his ileus tolerance of his diet with normal bowel movements. He still requires Reglan. Over the weekend urine appeared cloudy, UA is consistent with UTI and he still requires treatment of this starting today. He is currently tolerating a p.o. Diet. He is tolerant of his Coumadin. His INR is therapeutic. He has significant deconditioning because of his prolonged bed rest and his intercurrent CVA during this hospitalization which necessitat[facility]killed nursing facility. At this time patient is medically stable for discharge to skilled nursing facility.   76 minutes spent in the discharge process of his patient  Consults: Treatment Team:  W Fletcher Anon., MD Md Ccs, MD  Significant Diagnostic Studies:  PT INR 3.0 Discharge hemoglobin 9.4 TSH normal  1.0 Discharge creatinine 1.02     Ct Angio [name] W/cm &/or Wo Cm  07/14/2011  *RADIOLOGY REPORT*  Clinical Data:  Left-sided weakness.  Perioperative incident with decreased oxygenation.  Atrial fibrillation.  Hypertension.  CT ANGIOGRAPHY [name] AND NECK  Technique:  Multidetector CT imaging of the [name] and neck was performed using the standard protocol during bolus administration of intravenous contrast.  Multiplanar CT image reconstructions including MIPs were obtained to evaluate the vascular anatomy. Carotid stenosis measurements (when applicable) are obtained utilizing NASCET criteria, using the distal internal carotid diameter as the denominator.  Contrast: OMNIPAQUE IOHEXOL 350 MG/ML IV SOLN  Comparison:   None.  CTA NECK  Findings:  Conventional branching  of the great vessels  from the arch.  No proximal stenosis.  Calcific plaque surrounds both carotid bifurcations.  The right internal carotid artery is occluded without significant reconstitution in the neck.  The left internal carotid artery shows minimal nonstenotic plaque at its origin.  Both vertebrals are patent with the left dominant. There is no significant vertebral ostial stenosis.  Nasogastric tube.  No neck masses.  Asymmetric left laryngeal densities just underneath the left thyroid cartilage may reflect previous surgery.  Mild chronic sinus disease, worst in the right maxillary regions. Lung apices show scarring and air cyst formation, greater on the right.   Review of the MIP images confirms the above findings.  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  CTA [name]  Findings:  Collaterals reconstitute the right anterior circulation intracranially via the circle of Willis with slight retrograde filling of the right cavernous segment. There is an calcific nonstenotic disease affecting the cavernous segment of the left internal carotid artery. No visible ACA, MCA, or PCA stenosis. Large right cerebellar-pontine venous angioma.  Basilar artery widely patent with left greater than right vertebrals contributing.  There is cerebral, cerebellar, and brainstem atrophy with chronic microvascular ischemic change.  Remote right frontal infarct.  No visible acute stroke or intracranial hemorrhage.   Review of the MIP images confirms the above findings.  IMPRESSION: Collateral flow from the circle of Willis reconstitutes the right anterior circulation.  No visible proximal intracranial stenosis.  Original Report Authenticated By: Elsie Stain, M.D.   Dg Abd 1 View  07/19/2011  *RADIOLOGY REPORT*  Clinical Data: Abdominal pain and distention  ABDOMEN - 1 VIEW  Comparison: Plain film 07/17/2011  Findings: NG tube is unchanged with tip in the stomach antrum. There is gas within the colon which is  nondilated.  No dilated loops of small bowel.  There is gas in the rectum.  Multiple surgical clips.  IMPRESSION: No evidence of bowel obstruction.  No change.  Original Report Authenticated By: Genevive Bi, M.D.   Ct [name] Wo Contrast  07/13/2011  *RADIOLOGY REPORT*  Clinical Data: Weakness.  Evaluate for CVA  CT [name] WITHOUT CONTRAST  Technique:  Contiguous axial images were obtained from the base of the skull through the vertex without contrast.  Comparison: The 07/11/2011  Findings: Right frontal lobe encephalomalacia is identified and appears unchanged from previous exam compatible with prior infarct.  There is diffuse patchy low density throughout the subcortical and periventricular white matter consistent with chronic small vessel ischemic change.  There is prominence of the sulci and ventricles consistent with brain atrophy.  There is no evidence for acute brain infarct, hemorrhage or mass.  The mastoid air cells are clear.  There is chronic asymmetric opacification of the right maxillary sinus.  The skull appears intact.  IMPRESSION:  1.  Small vessel ischemic disease and brain atrophy. 2.  Chronic right frontal lobe infarct.  Original Report Authenticated By: Rosealee Albee, M.D.   Ct [name] Wo Contrast  07/11/2011  *RADIOLOGY REPORT*  Clinical Data: Left hemiplegia.  CT [name] WITHOUT CONTRAST  Technique:  Contiguous axial images were obtained from the base of the skull through the vertex without contrast.  Comparison: None.  Findings: Mildly enlarged ventricles and subarachnoid spaces. White matter low density in the right frontal lobe.  Minimal white matter low density in the left frontal lobe.  No intracranial hemorrhage, mass lesion or CT evidence of acute infarction.  Small, opacified right maxillary  sinus.  Left maxillary sinus mucosal thickening with a maximum thickness of 9 mm.  IMPRESSION:  1.  No acute CT abnormality. 2.  Mild atrophy. 3.  Old right frontal white matter infarct and minimal  chronic small vessel white matter ischemic changes in the left frontal lobe. 4.  Hypoplastic, opacified right maxillary sinus and chronic left maxillary sinusitis.  These results were called by telephone on 07/11/2011  at  2100 hours to  Tvedte, the patient's nurse, who verbally acknowledged these results.  Original Report Authenticated By: Darrol Angel, M.D.   Ct Angio Neck W/cm &/or Wo/cm  07/14/2011  *RADIOLOGY REPORT*  Clinical Data:  Left-sided weakness.  Perioperative incident with decreased oxygenation.  Atrial fibrillation.  Hypertension.  CT ANGIOGRAPHY [name] AND NECK  Technique:  Multidetector CT imaging of the [name] and neck was performed using the standard protocol during bolus administration of intravenous contrast.  Multiplanar CT image reconstructions including MIPs were obtained to evaluate the vascular anatomy. Carotid stenosis measurements (when applicable) are obtained utilizing NASCET criteria, using the distal internal carotid diameter as the denominator.  Contrast: OMNIPAQUE IOHEXOL 350 MG/ML IV SOLN  Comparison:   None.  CTA NECK  Findings:  Conventional branching of the great vessels from the arch.  No proximal stenosis.  Calcific plaque surrounds both carotid bifurcations.  The right internal carotid artery is occluded without significant reconstitution in the neck.  The left internal carotid artery shows minimal nonstenotic plaque at its origin.  Both vertebrals are patent with the left dominant. There is no significant vertebral ostial stenosis.  Nasogastric tube.  No neck masses.  Asymmetric left laryngeal densities just underneath the left thyroid cartilage may reflect previous surgery.  Mild chronic sinus disease, worst in the right maxillary regions. Lung apices show scarring and air cyst formation, greater on the right.   Review of the MIP images confirms the above findings.  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  CTA [name]   Findings:  Collaterals reconstitute the right anterior circulation intracranially via the circle of Willis with slight retrograde filling of the right cavernous segment. There is an calcific nonstenotic disease affecting the cavernous segment of the left internal carotid artery. No visible ACA, MCA, or PCA stenosis. Large right cerebellar-pontine venous angioma.  Basilar artery widely patent with left greater than right vertebrals contributing.  There is cerebral, cerebellar, and brainstem atrophy with chronic microvascular ischemic change.  Remote right frontal infarct.  No visible acute stroke or intracranial hemorrhage.   Review of the MIP images confirms the above findings.  IMPRESSION: Collateral flow from the circle of Willis reconstitutes the right anterior circulation.  No visible proximal intracranial stenosis.  Original Report Authenticated By: Elsie Stain, M.D.   Ct Abdomen Pelvis W Contrast  07/07/2011  *RADIOLOGY REPORT*  Clinical Data: Small bowel obstruction, question abscess, rectal bleeding  CT ABDOMEN AND PELVIS WITH CONTRAST  Technique:  Multidetector CT imaging of the abdomen and pelvis was performed following the standard protocol during bolus administration of intravenous contrast. Sagittal and coronal MPR images reconstructed from axial data set.  Contrast: Dilute oral contrast, 50 ml Omnipaque-300 IV  Comparison: 01/28/2009  Findings: Bibasilar pleural effusions atelectasis. Nasogastric tube in stomach. Multiple hepatic cysts. No additional focal abnormalities of the liver, spleen, pancreas, or adrenal glands. Distended urinary bladder. Dilatation of renal collecting systems/ureters, question due to bladder distention. Scattered atherosclerotic calcifications with prior stenting of the right iliac system.  Small amount  of free pelvic fluid at the base of the left pericolic gutter. Scattered fluid and gas throughout the minimally distended colon to rectum. Dilated proximal and  decompressed distal small bowel loops compatible with small bowel obstruction. Transition from dilated to nondilated small bowel loops occurs in the left mid pelvis, question adhesion. Prior ventral hernia repair. No bowel wall thickening, abscess or free intraperitoneal air.  No mass, adenopathy, or hernia. Bones appear diffusely demineralized. Orthopedic hardware proximal right femur across an intertrochanteric fracture. Degenerative disc disease changes and scoliosis lumbar spine.  IMPRESSION: Distended urinary bladder, which may account for ureteral dilatation and renal collecting system dilatation bilaterally. Small bowel obstruction with transition from dilated to nondilated small bowel occurring in the left mid pelvis, question related to adhesion. Gas and fluid throughout the colon, which could be related to diarrhea or ileus. Hepatic cysts. Bibasilar effusions atelectasis.  Post ORIF intertrochanteric fracture right femur.  Original Report Authenticated By: Lollie Marrow, M.D.   Dg Chest Port 1 View  07/28/2011  *RADIOLOGY REPORT*  Clinical Data: Cough, pneumonia.  PORTABLE CHEST - 1 VIEW  Comparison: 07/24/2011  Findings: Bibasilar densities, compatible with atelectasis or infiltrates, right greater than left.  Biapical pleural thickening/scarring, stable.  Right PICC line is unchanged.  Heart is normal size.  No effusions.  IMPRESSION: Bibasilar atelectasis or infiltrates, slightly increased since prior study.  Chronic changes.  Original Report Authenticated By: Cyndie Chime, M.D.   Dg Chest Port 1 View  07/24/2011  *RADIOLOGY REPORT*  Clinical Data: Follow up pneumonia  PORTABLE CHEST - 1 VIEW  Comparison: 07/14/2011  Findings: Cardiomediastinal silhouette is stable.  Stable right arm PICC line position with tip in SVC.  NG tube has been removed.  No pulmonary edema.  There is improvement in aeration.  Residual right base medially linear atelectasis or infiltrate.  No new infiltrate is noted.   IMPRESSION:  NG tube has been removed.  No pulmonary edema.  There is improvement in aeration.  Residual right base medially linear atelectasis or infiltrate.  No new infiltrate is noted.  Original Report Authenticated By: Natasha Mead, M.D.   Dg Chest Port 1 View  07/14/2011  *RADIOLOGY REPORT*  Clinical Data: Respiratory distress. History of pulmonary embolism.  Asthma.  Hypertension.  PORTABLE CHEST - 1 VIEW  Comparison: 07/12/2011  Findings: Nasogastric extends beyond the  inferior aspect of the film.  Right-sided PICC line unchanged, tip at low SVC.  Normal heart size.  Right greater than left bilateral pleural effusion without significant change.  No pneumothorax.  Mild pulmonary venous congestion, accentuated by low lung volumes. Right greater than left bibasilar airspace disease is unchanged.  IMPRESSION:  1. No significant change since the prior exam. 2.  Mild pulmonary venous congestion and bilateral pleural effusions and bibasilar airspace disease, likely atelectasis.  Original Report Authenticated By: Consuello Bossier, M.D.   Dg Chest Port 1 View  07/12/2011  *RADIOLOGY REPORT*  Clinical Data: Evaluate endotracheal tube and line placement. Hypertension.  PORTABLE CHEST - 1 VIEW  Comparison: Chest x-ray 07/11/2011.  Findings: The patient has been extubated. Nasogastric tube is seen extending into the stomach, however, the tip of the nasogastric tube extends below the lower margin of the image.  Right upper extremity PICC with tip terminating in the mid superior vena cava. Lung volumes remain low, there continues to be bibasilar opacities (right greater than left), favored to represent areas of atelectasis.  Small left and small - moderate right-sided pleural effusions are  unchanged.  Pulmonary venous congestion without frank pulmonary edema.  Heart size is normal. The patient is rotated to the left on today's exam, resulting in distortion of the mediastinal contours and reduced diagnostic sensitivity and  specificity for mediastinal pathology.  IMPRESSION: 1.  Support apparatus, as above.  The patient has been extubated. 2.  Low lung volumes with persistent bibasilar subsegmental atelectasis and bilateral pleural effusions (right greater than left).  Original Report Authenticated By: Florencia Reasons, M.D.   Dg Chest Port 1 View  07/11/2011  *RADIOLOGY REPORT*  Clinical Data: Check endotracheal tube.  PORTABLE CHEST - 1 VIEW  Comparison: 07/10/2011  Findings: Endotracheal tube is 2.9 cm above the carina.  PICC line tip in the SVC region.  Nasogastric tube extends into the abdomen. Stable hazy densities throughout the right lung and right lung base.  Probable small pleural effusions.  Heart size is stable. There may be slightly decreased edema in the left lung.  IMPRESSION: Minimal change in the appearance of the lungs.  Hazy densities in the right lung may represent a combination of edema and pleural fluid.  There may be slightly decreased edema in the left lung.  Support apparatuses as described.  Original Report Authenticated By: Richarda Overlie, M.D.   Dg Chest Port 1 View  07/10/2011  *RADIOLOGY REPORT*  Clinical Data: Tachypnea.  Status post intubation.  Question aspiration.  PORTABLE CHEST - 1 VIEW  Comparison: Portable chest 06/24/2011.  Findings: The the patient is intubated.  The endotracheal tube terminates 2.6 cm above the carina.  Mild interstitial edema is now present.  There are bilateral pleural effusions.  A right-sided PICC line terminates at the cavoatrial junction.  An NG tube courses off the inferior border of the film.  Biapical pleural parenchymal scarring is again noted.  Bibasilar airspace disease is slightly more prominent on the right.  IMPRESSION:  1.  Endotracheal tube terminates 2.6 cm above the carina, satisfactory position. 2.  Interval development of edema and bilateral pleural effusions, suggesting congestive heart failure. 3.  Satisfactory positioning of a right-sided PICC line. 4.   Right greater than left bibasilar airspace disease.  While this may represent atelectasis, early infection or even aspiration is not excluded.  Original Report Authenticated By: Jamesetta Orleans. MATTERN, M.D.   Dg Hip Portable 1 View Right  07/23/2011  *RADIOLOGY REPORT*  Clinical Data: Status post ORIF for right hip fracture.  PORTABLE RIGHT HIP - 1 VIEW  Comparison: Intraoperative radiographs dated 06/26/2011.  Findings: Dynamic hip compression screw is again seen traversing a intertrochanteric fracture of the right hip, without evidence of hardware loosening or other hardware related complications. Surgical clips is seen inferior to the right hip joint.  Fracture fragments show early signs of healing and incorporation.  IMPRESSION: 1.  Postoperative changes of a dynamic compression screw fixation for intertrochanteric fracture of the right hip, without complicating features, as above.  Original Report Authenticated By: Florencia Reasons, M.D.   Dg Abd 2 Views  07/30/2011  *RADIOLOGY REPORT*  Clinical Data: Small bowel obstruction  ABDOMEN - 2 VIEW  Comparison: July 28, 2011  Findings: The stool and bowel gas pattern is within normal limits with no evidence of obstruction or pneumoperitoneum.  Rectal gas is present.  Side plate and screw fixation of the right hip is anatomically aligned.  Thoricolumbar scoliosis and degenerative changes are again noted.  The visualized lung bases are clear.  IMPRESSION: Normal stool and bowel gas pattern.  Original Report Authenticated By:  Brandon Melnick, M.D.   Dg Abd 2 Views  07/09/2011  *RADIOLOGY REPORT*  Clinical Data: Small bowel obstruction  ABDOMEN - 2 VIEW  Comparison: Radiographs and CT abdomen 07/06/2011  Findings: Tip of nasogastric tube is within the stomach though the proximal side port is above the gastroesophageal junction. Persistent dilatation of small bowel loops compatible with small bowel obstruction, with increase in diameter of the a small bowel loop  in the left mid abdomen since previous study. Prior ventral hernia repair. Upper normal bowel wall thickness is seen at a small bowel loop in the right mid abdomen. Paucity of colonic gas. Bones demineralized. Prior stenting of right iliac arteries. Orthopedic hardware right femur.  IMPRESSION: Slightly increased small bowel distention compatible with persistent small bowel obstruction. Recommend advance nasogastric tube 5 cm to position proximal side port within stomach.  Original Report Authenticated By: Lollie Marrow, M.D.   Dg Abd Portable 1v  07/28/2011  *RADIOLOGY REPORT*  Clinical Data: Abdominal pain, ileus.  PORTABLE ABDOMEN - 1 VIEW  Comparison: 07/27/2011  Findings: Mild gaseous distention of bowel, predominately colon. No real change since prior study.  This likely reflects mild ileus. No free air organomegaly.  IMPRESSION: Stable mild gaseous distention of bowel, likely mild ileus.  Original Report Authenticated By: Cyndie Chime, M.D.   Dg Abd Portable 1v  07/27/2011  *RADIOLOGY REPORT*  Clinical Data: History of small-bowel obstruction.  Abdominal pain and distention.  PORTABLE ABDOMEN - 1 VIEW  Comparison: 07/22/2011.  Findings: Rectal gas is present.  There is moderate gaseous distention of loops of small and large intestine diffusely. Previous abdominal ventral surgery changes are seen.  Previous dynamic compression screw has been placed in the right proximal femur.  Scoliosis.  Changes of degenerative disc disease degenerative spondylosis.  Surgical clips in right inguinal region. No opaque calculi.  Pelvic phleboliths.  IMPRESSION: Rectal gas is present.  There is moderate gaseous distention of loops of small and large intestine diffusely. This could be functional or reflect an ileus.  Air fluid levels cannot be evaluated without horizontal beam examination.  Original Report Authenticated By: Crawford Givens, M.D.   Dg Abd Portable 1v  07/22/2011  *RADIOLOGY REPORT*  Clinical Data:  78 year old male with abdominal pain.  PORTABLE ABDOMEN - 1 VIEW  Comparison: 07/21/2011 and earlier.  Findings: Portable supine views of the abdomen and pelvis at 0840 hours.  Scoliosis.  Postoperative changes to the proximal right femur.  Sequelae of ventral abdominal hernia repair. Nonobstructed bowel gas pattern.  Probable left pleural effusion.  IMPRESSION: 1. Nonobstructed bowel gas pattern. 2.  Left pleural effusion.  Original Report Authenticated By: Harley Hallmark, M.D.   Dg Abd Portable 1v  07/21/2011  *RADIOLOGY REPORT*  Clinical Data: History of small-bowel obstruction.  Follow-up.  PORTABLE ABDOMEN - 1 VIEW  Comparison: 07/19/2011.  CT 07/06/2011.  Findings: The enteric tube seen on the prior study is no longer evident.  Colon gas is present.  There is dilatation of a loop of small intestine in the right side of the abdomen.  There is osteopenic appearance of the bones.  Scoliosis convexity to the right is seen.  There is degenerative spondylosis with osteophyte formation at multiple levels.  Previous anterior abdominal wall surgery is seen been performed.  Previous ORIF of right proximal femur has been performed with hardware in place without evidence of disruption.  Surgical clips are seen in the right inguinal region.  IMPRESSION: There has been interval removal of  the enteric tube.  There is dilatation of a loop of small intestine in the right side of the abdomen.  Colon gas is present.  Original Report Authenticated By: Crawford Givens, M.D.   Dg Abd Portable 1v  07/17/2011  *RADIOLOGY REPORT*  Clinical Data: Ileus.  PORTABLE ABDOMEN - 1 VIEW  Comparison: 07/09/2011  Findings: NG tube tip is in the distal stomach.  There has been resolution of the dilated small bowel loops.  There is a small amount of air scattered throughout the bowel.  Abdominal mesh is in place.  Rotoscoliosis of lumbar spine.  IMPRESSION: Resolution of small bowel distention.  Original Report Authenticated By: Gwynn Burly, M.D.   Dg Abd Portable 1v  07/06/2011  *RADIOLOGY REPORT*  Clinical Data: Rule out small bowel obstruction.  PORTABLE ABDOMEN - 1 VIEW  Comparison:  Abdominal radiograph 07/05/2011  Findings: When compared to the recent prior examinations, there is increasing gaseous distension throughout multiple borderline dilated and mildly dilated loops of small bowel (up to 5 cm in diameter in the left upper quadrant, and 4 cm in diameter in the right lower quadrant).  Gas is again seen throughout a mildly distended colon, with a moderate volume of stool in the rectal vault.  A nasogastric tube is seen in the stomach.  Postoperative changes of mesh repair for abdominal wall hernia are again noted overlying the abdomen.  No gross evidence of pneumoperitoneum on this single supine view.  Vascular stents in the right iliac circulation incidentally noted.  IMPRESSION: 1.  Nonspecific bowel gas pattern, with increasing small bowel dilatation, as detailed above.  Findings could be indicative of early or partial small bowel obstruction.  Clinical correlation is recommended.  This was made a call report.  Original Report Authenticated By: Florencia Reasons, M.D.   Dg Abd Portable 1v  07/06/2011  *RADIOLOGY REPORT*  Clinical data:  Abdominal distention, vomiting  ABDOMEN PORTABLE ONE-VIEW:  Comparison:  Portable exam 2345 hours compared to 07/04/2011  Findings: Coils from prior ventral hernia repair. Osseous demineralization. Orthopedic hardware proximal right femur post ORIF of intertrochanteric fracture. Lumbar scoliosis with degenerative disc disease changes. Gaseous distention of the colon and stomach. Air filled small bowel loops. Stool present in rectum. Bowel gas pattern appears nonobstructive. No bowel wall thickening or urinary tract calcification identified. Wall stents identified at the right iliac system.  IMPRESSION: Nonobstructive bowel gas pattern with mild gaseous distention of the colon noted, question  ileus.  Original Report Authenticated By: Lollie Marrow, M.D.   Echocardiogram Impressions:  - No cardiac source of embolism was identified, but cannot be ruled out on the basis of this examination.  Venous doppler US  - Findings consistent with chronic deep vein thrombosis involving the common femoral and femoral veins of thebilateral lower extremity. - No evidence of Baker's cyst on the right or left. Other specific details can be found in the table(s) above. Prepared and Electronically Authenticated by   Stool for C. Difficile positive, treated x10 days     Discharge Exam: Blood pressure 131/81, pulse 110, temperature 97.3 F (36.3 C), temperature source Oral, resp. rate 18, height 5\' 6"  (1.676 m), weight 71.1 kg (156 lb 12 oz), SpO2 96.00%. General appearance: alert and cooperative [name]: Normocephalic, without obvious abnormality, atraumatic Resp: clear to auscultation bilaterally Cardio: regular rate and rhythm, S1, S2 normal, no murmur, click, rub or gallop GI: soft, non-tender; bowel sounds normal; no masses,  no organomegaly Extremities: extremities normal, atraumatic, no cyanosis  or edema Neurologic: Motor: left hemiparesis lower extremity greater than upper extremity  Disposition: SNF    Medication List  As of 08/04/2011  1:19 PM   STOP taking these medications         CARDIZEM PO      metoprolol 100 MG tablet         TAKE these medications         ciprofloxacin 250 MG tablet   Commonly known as: CIPRO   Take 1 tablet (250 mg total) by mouth 2 (two) times daily.      clopidogrel 75 MG tablet   Commonly known as: PLAVIX   Take 75 mg by mouth daily.      diltiazem 180 MG 24 hr capsule   Commonly known as: DILACOR XR   Take 180 mg by mouth daily.      lactose free nutrition Liqd   Take 237 mLs by mouth 3 (three) times daily with meals.      metoCLOPramide 10 MG tablet   Commonly known as: REGLAN   Take 1 tablet (10 mg total) by mouth 3 (three)  times daily before meals.      metoprolol 50 MG tablet   Commonly known as: LOPRESSOR   Take 1 tablet (50 mg total) by mouth 2 (two) times daily.      pantoprazole 40 MG tablet   Commonly known as: PROTONIX   Take 40 mg by mouth daily.      simvastatin 20 MG tablet   Commonly known as: ZOCOR   Take 10 mg by mouth every evening. Pt cuts tab in half for 10 mg dose      warfarin 5 MG tablet   Commonly known as: COUMADIN   Take 2.5-5 mg by mouth See admin instructions. Pt takes 1/2 of 5 mg  Tablet for 2.5 mg dose on Monday,Wednesday,Friday,sunday. On Tuesday,Thursday, Saturday pt takes 5 mg.           Follow-up Information    Follow up with GROSS,STEVEN C., MD in 2 weeks.   Contact information:   [employer] [address] [place] [address] [phone]       Follow up with Katy Apo, MD in 2 weeks.   Contact information:   [address] [place] [address] [phone]       Follow up with Raymon Mutton, MD.   Contact information:   [address] [place] [address] [phone]          Signed: Katy Apo 08/04/2011, 1:19 PM

## 2011-08-04 NOTE — Progress Notes (Signed)
Agree with evaluation and management plan as outlined by Mr. Marlyne Beards, [place]

## 2011-08-04 NOTE — Progress Notes (Addendum)
ANTICOAGULATION CONSULT NOTE - Follow Up Consult  Pharmacy Consult for Warfarin Indication: Hx DVTs, PE  No Known Allergies  Patient Measurements: Height: 5\' 6"  (167.6 cm) Weight: 156 lb 12 oz (71.1 kg) IBW/kg (Calculated) : 63.8    Labs:  Basename 08/04/11 0410 08/03/11 0500 08/02/11 0655  HGB 9.4* 9.4* --  HCT 30.2* 29.9* --  PLT 324 350 --  APTT -- -- --  LABPROT 31.8* 29.2* 26.8*  INR 3.02* 2.71* 2.43*  HEPARINUNFRC -- -- --  CREATININE 1.09 1.02 --  CKTOTAL -- -- --  CKMB -- -- --  TROPONINI -- -- --   Estimated Creatinine Clearance: 52.8 ml/min (by C-G formula based on Cr of 1.09).   Medications:  Scheduled:     . lactose free nutrition  237 mL Oral Q1400  . diltiazem  180 mg Oral Daily  . docusate sodium  100 mg Oral BID  . feeding supplement  1 Container Oral BID  . lip balm  1 application Topical BID  . metoCLOPramide  10 mg Oral TID AC  . metoprolol tartrate  50 mg Oral BID  . pantoprazole  40 mg Oral Daily  . polyethylene glycol  17 g Oral Daily  . sodium chloride  10-40 mL Intracatheter Q12H  . warfarin  2.5 mg Oral ONCE-1800  . Warfarin - Pharmacist Dosing Inpatient   Does not apply q1800    Assessment: 77 yom on chronic warfarin for history of PE, DVTs, s/p ORIF on 2/22, ex-lap with LOA, enterotomy 3/8, acute CVA 3/9. Coumadin resumed 3/21.  Heparin discontinued 3/27 when INR>2.  Home Coumadin dose : 2.5mg  MWFSu, 5mg  other days.  INR supratherapeutic today, despite using home regimen. No bleeding reported/documented. H/H low but stable.   Goal of Therapy:  INR 2-3   Plan:   Hold warfarin today  Daily PT/INR   Lynann Beaver PharmD, BCPS Pager [phone] 08/04/2011 11:20 AM     ** ADDENDUM** Please note that Cipro will prolong the INR.  INR is currently elevated.  Use caution when resuming warfarin.

## 2011-08-04 NOTE — Progress Notes (Signed)
Pt hasn't voided since foley removed at 11am, no urge to void, Dr Nehemiah Settle aware, bladder scan showed 70-90 mls.  Dr Nehemiah Settle to call back w/instructions on whether to d/c to snf. [name]

## 2011-08-04 NOTE — Progress Notes (Signed)
25 Days Post-Op  Subjective: Feels good, ate well.  He says appetite is coming back, 2 BM yesterday and troday.  Objective: Vital signs in last 24 hours: Temp:  [97.3 F (36.3 C)-98.2 F (36.8 C)] 97.3 F (36.3 C) (04/02 0507) Pulse Rate:  [103-123] 110  (04/02 0507) Resp:  [16-18] 18  (04/02 0507) BP: (122-131)/(73-81) 131/81 mmHg (04/02 0507) SpO2:  [96 %-98 %] 96 % (04/02 0507) Last BM Date: 08/02/11  Intake/Output from previous day: 04/01 0701 - 04/02 0700 In: 720 [P.O.:720] Out: 850 [Urine:850] Intake/Output this shift: Total I/O In: 120 [P.O.:120] Out: -   General appearance: alert, cooperative and no distress Resp: clear to auscultation bilaterally and rales in bases GI: soft, non-tender; bowel sounds normal; no masses,  no organomegaly and Incision well healed. Still weak on left, cant hold anyting with left hand, cant bear wt on left  Lab Results:   Basename 08/04/11 0410 08/03/11 0500  WBC 6.6 7.6  HGB 9.4* 9.4*  HCT 30.2* 29.9*  PLT 324 350    BMET  Basename 08/04/11 0410 08/03/11 0500  NA 136 134*  K 4.0 3.9  CL 105 104  CO2 20 20  GLUCOSE 96 116*  BUN 18 18  CREATININE 1.09 1.02  CALCIUM 9.0 8.9   PT/INR  Basename 08/04/11 0410 08/03/11 0500  LABPROT 31.8* 29.2*  INR 3.02* 2.71*     Lab 07/30/11 0409  AST 26  ALT 19  ALKPHOS 158*  BILITOT 0.2*  PROT 7.6  ALBUMIN 2.0*     Lipase     Component Value Date/Time   LIPASE 52 07/04/2011 0919     Studies/Results: No results found.  Medications:    . lactose free nutrition  237 mL Oral Q1400  . diltiazem  180 mg Oral Daily  . docusate sodium  100 mg Oral BID  . feeding supplement  1 Container Oral BID  . lip balm  1 application Topical BID  . metoCLOPramide  10 mg Oral TID AC  . metoprolol tartrate  50 mg Oral BID  . pantoprazole  40 mg Oral Daily  . polyethylene glycol  17 g Oral Daily  . sodium chloride  10-40 mL Intracatheter Q12H  . warfarin  2.5 mg Oral ONCE-1800  .  Warfarin - Pharmacist Dosing Inpatient   Does not apply q1800    Assessment/Plan SBO (small bowel obstruction) s/p expl lap, LOA, repair of enterotomy on 3/8-had prolonged postop ileus which is slowly resolving; on liquid diet.  C. Diff Colits 07/11/11, just finished flagyl.  PE (pulmonary embolism)  Chronic anticoagulation  Fall from standing  Fracture of hip, right, closed  Fecal impaction in rectum  Chronic constipation  Acute respiratory failure with hypoxia  Tachycardia  A-fib  CVA WITH left sided weakness    Plan:  For SNF,  DOING well from surgery.  We can follow up in office after dc to rehab.     LOS: 41 days    Carter Kaman 08/04/2011

## 2011-08-21 ENCOUNTER — Ambulatory Visit (INDEPENDENT_AMBULATORY_CARE_PROVIDER_SITE_OTHER): Payer: Medicare HMO | Admitting: Surgery

## 2011-08-21 ENCOUNTER — Encounter (INDEPENDENT_AMBULATORY_CARE_PROVIDER_SITE_OTHER): Payer: Self-pay | Admitting: Surgery

## 2011-08-21 VITALS — BP 146/64 | HR 90 | Temp 97.4°F | Resp 18 | Ht 67.0 in | Wt 140.0 lb

## 2011-08-21 DIAGNOSIS — I69991 Dysphagia following unspecified cerebrovascular disease: Secondary | ICD-10-CM

## 2011-08-21 DIAGNOSIS — I69391 Dysphagia following cerebral infarction: Secondary | ICD-10-CM | POA: Insufficient documentation

## 2011-08-21 DIAGNOSIS — K56609 Unspecified intestinal obstruction, unspecified as to partial versus complete obstruction: Secondary | ICD-10-CM

## 2011-08-21 DIAGNOSIS — K319 Disease of stomach and duodenum, unspecified: Secondary | ICD-10-CM

## 2011-08-21 DIAGNOSIS — K3189 Other diseases of stomach and duodenum: Secondary | ICD-10-CM | POA: Insufficient documentation

## 2011-08-21 NOTE — Patient Instructions (Signed)
GETTING TO GOOD BOWEL HEALTH. Irregular bowel habits such as constipation and diarrhea can lead to many problems over time.  Having one soft bowel movement a day is the most important way to prevent further problems.  The anorectal canal is designed to handle stretching and feces to safely manage our ability to get rid of solid waste (feces, poop, stool) out of our body.  BUT, hard constipated stools can act like ripping concrete bricks and diarrhea can be a burning fire to this very sensitive area of our body, causing inflamed hemorrhoids, anal fissures, increasing risk is perirectal abscesses, abdominal pain/bloating, an making irritable bowel worse.     The goal: ONE SOFT BOWEL MOVEMENT A DAY!  To have soft, regular bowel movements:    Drink at least 8 tall glasses of water a day.     Take plenty of fiber.  Fiber is the undigested part of plant food that passes into the colon, acting s "natures broom" to encourage bowel motility and movement.  Fiber can absorb and hold large amounts of water. This results in a larger, bulkier stool, which is soft and easier to pass. Work gradually over several weeks up to 6 servings a day of fiber (25g a day even more if needed) in the form of: o Vegetables -- Root (potatoes, carrots, turnips), leafy green (lettuce, salad greens, celery, spinach), or cooked high residue (cabbage, broccoli, etc) o Fruit -- Fresh (unpeeled skin & pulp), Dried (prunes, apricots, cherries, etc ),  or stewed ( applesauce)  o Whole grain breads, pasta, etc (whole wheat)  o Bran cereals    Bulking Agents -- This type of water-retaining fiber generally is easily obtained each day by one of the following:  o Psyllium bran -- The psyllium plant is remarkable because its ground seeds can retain so much water. This product is available as Metamucil, Konsyl, Effersyllium, Per Diem Fiber, or the less expensive generic preparation in drug and health food stores. Although labeled a laxative, it really  is not a laxative.  o Methylcellulose -- This is another fiber derived from wood which also retains water. It is available as Citrucel. o Polyethylene Glycol - and "artificial" fiber commonly called Miralax or Glycolax.  It is helpful for people with gassy or bloated feelings with regular fiber o Flax Seed - a less gassy fiber than psyllium   No reading or other relaxing activity while on the toilet. If bowel movements take longer than 5 minutes, you are too constipated   AVOID CONSTIPATION.  High fiber and water intake usually takes care of this.  Sometimes a laxative is needed to stimulate more frequent bowel movements, but    Laxatives are not a good long-term solution as it can wear the colon out. o Osmotics (Milk of Magnesia, Fleets phosphosoda, Magnesium citrate, MiraLax, GoLytely) are safer than  o Stimulants (Senokot, Castor Oil, Dulcolax, Ex Lax)    o Do not take laxatives for more than 7days in a row.    IF SEVERELY CONSTIPATED, try a Bowel Retraining Program: o Do not use laxatives.  o Eat a diet high in roughage, such as bran cereals and leafy vegetables.  o Drink six (6) ounces of prune or apricot juice each morning.  o Eat two (2) large servings of stewed fruit each day.  o Take one (1) heaping tablespoon of a psyllium-based bulking agent twice a day. Use sugar-free sweetener when possible to avoid excessive calories.  o Eat a normal breakfast.  o   Set aside 15 minutes after breakfast to sit on the toilet, but do not strain to have a bowel movement.  o If you do not have a bowel movement by the third day, use an enema and repeat the above steps.    Controlling diarrhea o Switch to liquids and simpler foods for a few days to avoid stressing your intestines further. o Avoid dairy products (especially milk & ice cream) for a short time.  The intestines often can lose the ability to digest lactose when stressed. o Avoid foods that cause gassiness or bloating.  Typical foods include  beans and other legumes, cabbage, broccoli, and dairy foods.  Every person has some sensitivity to other foods, so listen to our body and avoid those foods that trigger problems for you. o Adding fiber (Citrucel, Metamucil, psyllium, Miralax) gradually can help thicken stools by absorbing excess fluid and retrain the intestines to act more normally.  Slowly increase the dose over a few weeks.  Too much fiber too soon can backfire and cause cramping & bloating. o Probiotics (such as active yogurt, Align, etc) may help repopulate the intestines and colon with normal bacteria and calm down a sensitive digestive tract.  Most studies show it to be of mild help, though, and such products can be costly. o Medicines:   Bismuth subsalicylate (ex. Kayopectate, Pepto Bismol) every 30 minutes for up to 6 doses can help control diarrhea.  Avoid if pregnant.   Loperamide (Immodium) can slow down diarrhea.  Start with two tablets (4mg  total) first and then try one tablet every 6 hours.  Avoid if you are having fevers or severe pain.  If you are not better or start feeling worse, stop all medicines and call your doctor for advice o Call your doctor if you are getting worse or not better.  Sometimes further testing (cultures, endoscopy, X-ray studies, bloodwork, etc) may be needed to help diagnose and treat the cause of the diarrhea.   Dysphagia Diet Level 2, Mechanically Altered This dysphagia mechanically altered diet is restricted to: Foods that are moist, soft-textured, and easy to chew and swallow.  Meats that are ground or minced to no larger than -inch pieces. Meats are moist with gravy or sauce added.  Foods that do not include bread or bread-like textures except soft pancakes, well-moistened with syrup or sauce.  Textures with some chewing ability required.  Casseroles without rice.  Cooked vegetables that are less than -inch in size and easily mashed with a fork. No cooked corn, peas, broccoli,  cauliflower, cabbage, Brussels sprouts, asparagus, or other fibrous, non-tender, or rubbery cooked vegetables.  Canned fruit except for pineapple. Fruit must be cut into no larger than -inch pieces.  Foods that do not include nuts, seeds, coconut, or sticky textures.  FOOD TEXTURES Includes all foods listed on Dysphagia Diet Level 1, Pureed, in addition to the foods listed below. Beverages Recommended: All beverages thickened to recommended consistency with minimal amounts of texture pulp. Any texture should be suspended in the liquid and should not fall out.  Avoid: All others.  You are currently limited to one of the following liquid consistency levels:  Thin.  Nectar-like.  Honey-like.  Spoon-thick.  Breads Recommended: Soft pancakes, well-moistened with syrup or sauce.  Avoid: All others.  Cereals Recommended: Cooked cereals with little texture, including oatmeal. Unprocessed wheat bran stirred into cereals for bulk. If thin liquids are restricted, it is important that all of the liquid is absorbed into the cereal.  Avoid: All dry cereals and any cooked cereals that may contain flax seeds or other seeds or nuts. Whole-grain, dry, or coarse cereals. Cereals with nuts, seeds, dried fruit, or coconut.  Desserts Recommended: Pudding, custard. Soft fruit pies with bottom crust only. Canned fruit (excluding pineapple). Soft, moist cakes with icing.  Avoid: Dry, coarse cakes and cookies. Anything with nuts, seeds, coconut, pineapple, or dried fruit. Breakfast yogurt with nuts. Rice or bread pudding.  These foods are considered thin liquids and should be avoided if thin liquids are restricted:  Frozen malts, milk shakes, frozen yogurt, eggnog, nutritional supplements, ice cream, sherbet, regular or sugar-free gelatin, or any foods that become thin liquid at either room temperature, 70 F (21.1 C) or body temperature, 98 F (36.7 C).  Fats Recommended: Butter, margarine, cream for cereal  (depending on liquid consistency recommendations), gravy, cream sauces, sour cream, sour cream dips with soft additives, mayonnaise, salad dressings, cream cheese, cream cheese spreads with soft additives, whipped toppings.  Avoid: All fats with coarse or chunky additives.  Fruits Recommended: Soft drained, canned, or cooked fruits without seeds or skin. Fresh soft and ripe banana. Fruit juices with a small amount of pulp. If thin liquids are restricted, fruit juices should be thickened to appropriate consistency.  Avoid: Fresh or frozen fruits. Cooked fruit with skin or seeds. Dried fruits. Fresh, canned, or cooked pineapple.  Meats and Meat Substitutes Meat pieces should not exceed -inch cubes and should be tender. Recommended: Moistened ground or cooked meat, poultry, or fish. Moist ground or tender meat may be served with gravy or sauce. Casseroles without rice. Moist macaroni and cheese, well-cooked pasta with meat sauce, tuna noodle casserole, soft, moist lasagna. Moist meatballs, meatloaf, or fish loaf. Protein salads, such as tuna or egg without large chunks, celery, or onion. Cottage cheese, smooth quiche without large chunks. Poached, scrambled, or soft-cooked eggs (egg yolks should not be "runny" but should be moist and able to be mashed with butter, margarine, or other moisture added to them). Cook eggs to 160 F (71.1 C) or use pasteurized eggs for safety. Souffls may have small, soft chunks. Tofu. Well-cooked, slightly mashed, moist legumes such as baked beans. All meats or protein substitutes should be served with sauces or moistened to help maintain cohesiveness in the mouth.  Avoid: Dry meats and tough meats, such as bacon, sausage, hot dogs, and bratwurst. Dry casseroles or casseroles with rice or large chunks. Peanut butter. Cheese slices and cubes. Hard-cooked or crisp fried eggs. Sandwiches. Pizza.  Potatoes and Starches Recommended: Well-cooked, moistened, boiled, baked, or mashed  potatoes. Well-cooked shredded hash brown potatoes that are not crisp. All potatoes need to be moist and in sauces. Well-cooked noodles in sauce. Spaetzel or soft dumplings that have been moistened with butter or gravy.  Avoid: Potato skins and chips. Fried or French-fried potatoes. Rice.  Soups Recommended: Soups with easy-to-chew or easy-to-swallow meats or vegetables. Contents in soups should be less than -inch pieces. Soups will need to be thickened to appropriate consistency if soup is thinner than prescribed liquid consistency.  Avoid: Soups with large chunks of meat and vegetables. Soups with rice, corn, peas.  Vegetables Recommended: All soft, well-cooked vegetables. Vegetables should be less than -inch pieces. They should be easily mashed with a fork.  Avoid: Cooked corn and peas. Broccoli, cabbage, Brussels sprouts, asparagus, or other fibrous, non-tender, or rubbery cooked vegetables.  Miscellaneous Recommended: Jams and preserves without seeds, jelly. Sauces or salsas with small, tender, less than -inch  pieces. Soft, smooth chocolate bars that are easily chewed.  Avoid: Seeds, nuts, coconut, or sticky foods. Chewy candies such as caramels or licorice.  Document Released: 04/20/2005 Document Revised: 04/09/2011 Document Reviewed: 05/13/2009 [facility] Patient Information 2012 [place], [place].

## 2011-08-21 NOTE — Progress Notes (Signed)
Subjective:     Patient ID: [name], male   DOB: [birth year], 78 y.o.   MRN: [id number]  HPI  [name]  1933-12-19 [id number]  Patient Care Team: Katy Apo, MD as PCP - General (Internal Medicine) Raymon Mutton, MD as Consulting Physician (Orthopedic Surgery) Shirley Friar, MD as Consulting Physician (Gastroenterology)  This patient is a 78 y.o.male who presents today for surgical evaluation.   Procedure: Exploratory laparotomy and lysis of adhesions 07/10/2011  The patient comes in today with his family. Starting to feel better. He is in rehabilitation. His appetite is fair. Slowly returning. Using supplemental shakes. He had a postoperative neurological compromise. Probable stroke. Using his upper tremor is fine. Having some difficulty bearing weight on the left lower side. Has a Foley catheter. Due to followup with urology for probable removal. On Flomax.  Occasionally coughs with eating his mechanically soft diet. His daughter is worried about possible persistent dysphasia. Speech therapy involved. They have not want to modify the diet yet. He denies any nausea or vomiting. Having bowel movements twice a day now. Denies hip pain. Claims his orthopedic surgeon cleared him after his hip replacement now.  Patient Active Problem List  Diagnoses  . PE (pulmonary embolism)  . Chronic anticoagulation  . Fall from standing  . Fracture of hip, right, closed  . Peripheral arterial disease  . SBO (small bowel obstruction)  . Fecal impaction in rectum  . Chronic constipation  . Acute respiratory failure with hypoxia  . Tachycardia  . A-fib  . Brunner's gland hyperplasia of duodenum  . Dysphagia S/P CVA (cerebrovascular accident)    Past Medical History  Diagnosis Date  . Asthma   . Hypertension   . Pulmonary embolism   . Peripheral artery disease   . Hypercholesteremia   . Bursitis of right shoulder   . DVT of leg (deep venous thrombosis)   . Stroke   . Cough    . Trouble swallowing   . Change in voice   . Difficulty urinating   . Easy bruising     Past Surgical History  Procedure Date  . Hernia repair   . Appendectomy   . Femoral-femoral bypass graft   . Iliac artery stent   . Esophagogastroduodenoscopy 07/03/2011    Procedure: ESOPHAGOGASTRODUODENOSCOPY (EGD);  Surgeon: Shirley Friar, MD;  Location: [facility];  Service: Endoscopy;  Laterality: N/A;  . Compression hip screw 06/26/2011    Procedure: COMPRESSION HIP;  Surgeon: Raymon Mutton, MD;  Location: [facility];  Service: Orthopedics;  Laterality: Right;  . Laparoscopy 07/10/2011    Procedure: LAPAROSCOPY DIAGNOSTIC;  Surgeon: Ardeth Sportsman, MD;  Location: [facility];  Service: General;  Laterality: N/A;  serousal repair,    History   Social History  . Marital Status: Married    Spouse Name: N/A    Number of Children: N/A  . Years of Education: N/A   Occupational History  . Not on file.   Social History Main Topics  . Smoking status: Former Smoker    Quit date: 08/20/1993  . Smokeless tobacco: Never Used  . Alcohol Use: No  . Drug Use: No  . Sexually Active: No   Other Topics Concern  . Not on file   Social History Narrative  . No narrative on file    History reviewed. No pertinent family history.  Current Outpatient Prescriptions  Medication Sig Dispense Refill  . metoCLOPramide (REGLAN) 10 MG tablet Take 10 mg by mouth  3 (three) times daily.      . Multiple Vitamin (MULTIVITAMIN) tablet Take 1 tablet by mouth daily.      . NON FORMULARY 30 mLs 2 (two) times daily. Provide Gold Regular Liquid - for wound healing.      . Tamsulosin HCl (FLOMAX) 0.4 MG CAPS Take 0.4 mg by mouth daily.      . vitamin C (ASCORBIC ACID) 500 MG tablet Take 500 mg by mouth 2 (two) times daily.      Marland Kitchen warfarin (COUMADIN) 5 MG tablet Take 2.5-5 mg by mouth See admin instructions. Pt takes 1/2 of 5 mg  Tablet for 2.5 mg dose on Monday,Wednesday,Friday,sunday. On Tuesday,Thursday,  Saturday pt takes 5 mg.      . clopidogrel (PLAVIX) 75 MG tablet Take 75 mg by mouth daily.      Marland Kitchen diltiazem (DILACOR XR) 180 MG 24 hr capsule Take 180 mg by mouth daily.      Marland Kitchen lactose free nutrition (BOOST PLUS) LIQD Take 237 mLs by mouth 3 (three) times daily with meals.      . metoCLOPramide (REGLAN) 10 MG tablet Take 1 tablet (10 mg total) by mouth 3 (three) times daily before meals.      . metoprolol (LOPRESSOR) 50 MG tablet Take 1 tablet (50 mg total) by mouth 2 (two) times daily.      . pantoprazole (PROTONIX) 40 MG tablet Take 40 mg by mouth daily.      . simvastatin (ZOCOR) 20 MG tablet Take 10 mg by mouth every evening. Pt cuts tab in half for 10 mg dose         No Known Allergies  BP 146/64  Pulse 90  Temp(Src) 97.4 F (36.3 C) (Temporal)  Resp 18  Ht 5\' 7"  (1.702 m)  Wt 140 lb (63.504 kg)  BMI 21.93 kg/m2     Review of Systems  Constitutional: Positive for activity change, appetite change, fatigue and unexpected weight change. Negative for fever, chills and diaphoresis.  HENT: Negative for sore throat, trouble swallowing and neck pain.   Eyes: Negative for photophobia and visual disturbance.  Respiratory: Negative for choking and shortness of breath.   Cardiovascular: Negative for chest pain and palpitations.  Gastrointestinal: Negative for nausea, vomiting, abdominal pain, diarrhea, constipation, blood in stool, abdominal distention, anal bleeding and rectal pain.  Genitourinary: Negative for dysuria, urgency, difficulty urinating and testicular pain.  Musculoskeletal: Negative for myalgias, arthralgias and gait problem.  Skin: Negative for color change and rash.  Neurological: Positive for weakness. Negative for dizziness, speech difficulty and numbness.  Hematological: Negative for adenopathy.  Psychiatric/Behavioral: Negative for hallucinations, confusion and agitation.       Objective:   Physical Exam  Constitutional: He is oriented to person, place, and  time. He appears well-developed and well-nourished. No distress.  HENT:  Head: Normocephalic.  Mouth/Throat: Oropharynx is clear and moist. No oropharyngeal exudate.  Eyes: Conjunctivae and EOM are normal. Pupils are equal, round, and reactive to light. No scleral icterus.  Neck: Normal range of motion. No tracheal deviation present.  Cardiovascular: Normal rate, normal heart sounds and intact distal pulses.   Pulmonary/Chest: Effort normal. No respiratory distress.  Abdominal: Soft. He exhibits no distension and no mass. There is no rebound and no guarding. Hernia confirmed negative in the right inguinal area and confirmed negative in the left inguinal area.       Incisions clean with normal healing ridges.  No hernias  Mild upper abdominal soreness with  coughing only. No peritonitis  Musculoskeletal: Normal range of motion. He exhibits no tenderness.  Neurological: He is alert and oriented to person, place, and time. No cranial nerve deficit. He exhibits normal muscle tone. Coordination normal.  Skin: Skin is warm and dry. No rash noted. He is not diaphoretic.  Psychiatric: He has a normal mood and affect. His speech is normal and behavior is normal. Judgment and thought content normal. His mood appears not anxious. His affect is not angry. Cognition and memory are normal. He does not exhibit a depressed mood.       Assessment:     2 months s/p urgent lap/open LOA for SBO w fecal disimpaction.  Gradually recovering    Plan:     Increase activity as tolerated.  Do not push through pain.  Continue rehabilitation with physical therapy as tolerated.  Advanced on diet as tolerated. Bowel regimen to avoid problems.  Twice a day bowel movements reassuring. Continue supplemental shakes until he is gaining weight.  Consider reevaluation of his swallow eval her modified barium swallow to see if he's out of the woods and can be advanced to a more regular diet. The fact he continues to cough and  become comfortable advancing his diet without any formal evaluation. I will defer to Dr. Abner Greenspan on this.  Recovery from stroke per Dr. Nehemiah Settle.  He is getting doubly anticoagulated with warfarin and Plavix.  He has escaped any problems with leak or abscess or wound infection. I therefore think he can return to clinic p.r.n. The patient expressed understanding and appreciation

## 2011-09-26 ENCOUNTER — Inpatient Hospital Stay (HOSPITAL_COMMUNITY)
Admission: EM | Admit: 2011-09-26 | Discharge: 2011-10-12 | DRG: 356 | Disposition: A | Discharge status: Skilled Nursing Facility | Payer: Medicare HMO | Attending: Internal Medicine | Admitting: Internal Medicine

## 2011-09-26 ENCOUNTER — Other Ambulatory Visit: Payer: Self-pay

## 2011-09-26 ENCOUNTER — Emergency Department (HOSPITAL_COMMUNITY): Payer: Medicare HMO

## 2011-09-26 DIAGNOSIS — N138 Other obstructive and reflux uropathy: Secondary | ICD-10-CM | POA: Diagnosis present

## 2011-09-26 DIAGNOSIS — D62 Acute posthemorrhagic anemia: Secondary | ICD-10-CM | POA: Diagnosis present

## 2011-09-26 DIAGNOSIS — I428 Other cardiomyopathies: Secondary | ICD-10-CM | POA: Diagnosis present

## 2011-09-26 DIAGNOSIS — K922 Gastrointestinal hemorrhage, unspecified: Secondary | ICD-10-CM | POA: Diagnosis present

## 2011-09-26 DIAGNOSIS — Z7902 Long term (current) use of antithrombotics/antiplatelets: Secondary | ICD-10-CM

## 2011-09-26 DIAGNOSIS — K2981 Duodenitis with bleeding: Secondary | ICD-10-CM | POA: Diagnosis present

## 2011-09-26 DIAGNOSIS — Z87448 Personal history of other diseases of urinary system: Secondary | ICD-10-CM | POA: Diagnosis present

## 2011-09-26 DIAGNOSIS — Z7982 Long term (current) use of aspirin: Secondary | ICD-10-CM

## 2011-09-26 DIAGNOSIS — J9600 Acute respiratory failure, unspecified whether with hypoxia or hypercapnia: Secondary | ICD-10-CM | POA: Diagnosis not present

## 2011-09-26 DIAGNOSIS — I82409 Acute embolism and thrombosis of unspecified deep veins of unspecified lower extremity: Secondary | ICD-10-CM

## 2011-09-26 DIAGNOSIS — Z87891 Personal history of nicotine dependence: Secondary | ICD-10-CM

## 2011-09-26 DIAGNOSIS — Z86718 Personal history of other venous thrombosis and embolism: Secondary | ICD-10-CM

## 2011-09-26 DIAGNOSIS — Y832 Surgical operation with anastomosis, bypass or graft as the cause of abnormal reaction of the patient, or of later complication, without mention of misadventure at the time of the procedure: Secondary | ICD-10-CM | POA: Diagnosis present

## 2011-09-26 DIAGNOSIS — K228 Other specified diseases of esophagus: Secondary | ICD-10-CM | POA: Diagnosis present

## 2011-09-26 DIAGNOSIS — N401 Enlarged prostate with lower urinary tract symptoms: Secondary | ICD-10-CM | POA: Diagnosis present

## 2011-09-26 DIAGNOSIS — D649 Anemia, unspecified: Secondary | ICD-10-CM | POA: Diagnosis present

## 2011-09-26 DIAGNOSIS — I2699 Other pulmonary embolism without acute cor pulmonale: Secondary | ICD-10-CM | POA: Diagnosis present

## 2011-09-26 DIAGNOSIS — J45909 Unspecified asthma, uncomplicated: Secondary | ICD-10-CM | POA: Diagnosis present

## 2011-09-26 DIAGNOSIS — R000 Tachycardia, unspecified: Secondary | ICD-10-CM | POA: Diagnosis present

## 2011-09-26 DIAGNOSIS — K56609 Unspecified intestinal obstruction, unspecified as to partial versus complete obstruction: Secondary | ICD-10-CM | POA: Diagnosis present

## 2011-09-26 DIAGNOSIS — K921 Melena: Secondary | ICD-10-CM

## 2011-09-26 DIAGNOSIS — I498 Other specified cardiac arrhythmias: Secondary | ICD-10-CM | POA: Diagnosis present

## 2011-09-26 DIAGNOSIS — Z7901 Long term (current) use of anticoagulants: Secondary | ICD-10-CM

## 2011-09-26 DIAGNOSIS — I69959 Hemiplegia and hemiparesis following unspecified cerebrovascular disease affecting unspecified side: Secondary | ICD-10-CM

## 2011-09-26 DIAGNOSIS — I639 Cerebral infarction, unspecified: Secondary | ICD-10-CM | POA: Diagnosis present

## 2011-09-26 DIAGNOSIS — I429 Cardiomyopathy, unspecified: Secondary | ICD-10-CM | POA: Diagnosis present

## 2011-09-26 DIAGNOSIS — I10 Essential (primary) hypertension: Secondary | ICD-10-CM | POA: Diagnosis present

## 2011-09-26 DIAGNOSIS — K208 Other esophagitis without bleeding: Secondary | ICD-10-CM | POA: Diagnosis present

## 2011-09-26 DIAGNOSIS — I200 Unstable angina: Secondary | ICD-10-CM | POA: Diagnosis present

## 2011-09-26 DIAGNOSIS — K31811 Angiodysplasia of stomach and duodenum with bleeding: Principal | ICD-10-CM | POA: Diagnosis present

## 2011-09-26 DIAGNOSIS — E7800 Pure hypercholesterolemia, unspecified: Secondary | ICD-10-CM | POA: Diagnosis present

## 2011-09-26 DIAGNOSIS — I255 Ischemic cardiomyopathy: Secondary | ICD-10-CM | POA: Diagnosis present

## 2011-09-26 DIAGNOSIS — I219 Acute myocardial infarction, unspecified: Secondary | ICD-10-CM

## 2011-09-26 DIAGNOSIS — I214 Non-ST elevation (NSTEMI) myocardial infarction: Secondary | ICD-10-CM

## 2011-09-26 DIAGNOSIS — Z8719 Personal history of other diseases of the digestive system: Secondary | ICD-10-CM

## 2011-09-26 DIAGNOSIS — E785 Hyperlipidemia, unspecified: Secondary | ICD-10-CM | POA: Diagnosis present

## 2011-09-26 DIAGNOSIS — T82898A Other specified complication of vascular prosthetic devices, implants and grafts, initial encounter: Secondary | ICD-10-CM | POA: Diagnosis not present

## 2011-09-26 DIAGNOSIS — I4891 Unspecified atrial fibrillation: Secondary | ICD-10-CM | POA: Diagnosis present

## 2011-09-26 DIAGNOSIS — R338 Other retention of urine: Secondary | ICD-10-CM | POA: Diagnosis present

## 2011-09-26 DIAGNOSIS — K2289 Other specified disease of esophagus: Secondary | ICD-10-CM | POA: Diagnosis present

## 2011-09-26 DIAGNOSIS — R339 Retention of urine, unspecified: Secondary | ICD-10-CM | POA: Diagnosis present

## 2011-09-26 DIAGNOSIS — K2990 Gastroduodenitis, unspecified, without bleeding: Secondary | ICD-10-CM | POA: Diagnosis present

## 2011-09-26 DIAGNOSIS — R0902 Hypoxemia: Secondary | ICD-10-CM | POA: Diagnosis not present

## 2011-09-26 DIAGNOSIS — I2510 Atherosclerotic heart disease of native coronary artery without angina pectoris: Secondary | ICD-10-CM | POA: Diagnosis present

## 2011-09-26 DIAGNOSIS — I739 Peripheral vascular disease, unspecified: Secondary | ICD-10-CM | POA: Diagnosis present

## 2011-09-26 DIAGNOSIS — S72001A Fracture of unspecified part of neck of right femur, initial encounter for closed fracture: Secondary | ICD-10-CM | POA: Diagnosis present

## 2011-09-26 DIAGNOSIS — K2970 Gastritis, unspecified, without bleeding: Secondary | ICD-10-CM | POA: Diagnosis present

## 2011-09-26 DIAGNOSIS — Z86711 Personal history of pulmonary embolism: Secondary | ICD-10-CM

## 2011-09-26 DIAGNOSIS — I743 Embolism and thrombosis of arteries of the lower extremities: Secondary | ICD-10-CM | POA: Diagnosis not present

## 2011-09-26 DIAGNOSIS — I70209 Unspecified atherosclerosis of native arteries of extremities, unspecified extremity: Secondary | ICD-10-CM | POA: Diagnosis present

## 2011-09-26 DIAGNOSIS — Z79899 Other long term (current) drug therapy: Secondary | ICD-10-CM

## 2011-09-26 DIAGNOSIS — Y9210 Unspecified residential institution as the place of occurrence of the external cause: Secondary | ICD-10-CM | POA: Diagnosis present

## 2011-09-26 HISTORY — DX: Acute myocardial infarction, unspecified: I21.9

## 2011-09-26 HISTORY — DX: Atherosclerotic heart disease of native coronary artery without angina pectoris: I25.10

## 2011-09-26 LAB — CBC
HCT: 31.3 % — ABNORMAL LOW (ref 39.0–52.0)
MCHC: 31 g/dL (ref 30.0–36.0)
MCV: 82.6 fL (ref 78.0–100.0)
Platelets: 416 10*3/uL — ABNORMAL HIGH (ref 150–400)
RDW: 16.3 % — ABNORMAL HIGH (ref 11.5–15.5)
WBC: 13.3 10*3/uL — ABNORMAL HIGH (ref 4.0–10.5)

## 2011-09-26 LAB — DIFFERENTIAL
Basophils Absolute: 0 10*3/uL (ref 0.0–0.1)
Eosinophils Absolute: 0.2 10*3/uL (ref 0.0–0.7)
Eosinophils Relative: 2 % (ref 0–5)
Lymphocytes Relative: 28 % (ref 12–46)
Monocytes Absolute: 0.7 10*3/uL (ref 0.1–1.0)

## 2011-09-26 LAB — COMPREHENSIVE METABOLIC PANEL
AST: 32 U/L (ref 0–37)
Albumin: 3 g/dL — ABNORMAL LOW (ref 3.5–5.2)
BUN: 61 mg/dL — ABNORMAL HIGH (ref 6–23)
Chloride: 100 mEq/L (ref 96–112)
Creatinine, Ser: 1 mg/dL (ref 0.50–1.35)
Total Protein: 7 g/dL (ref 6.0–8.3)

## 2011-09-26 LAB — PROTIME-INR: INR: 2.59 — ABNORMAL HIGH (ref 0.00–1.49)

## 2011-09-26 LAB — POCT I-STAT TROPONIN I

## 2011-09-26 MED ORDER — PANTOPRAZOLE SODIUM 40 MG IV SOLR
40.0000 mg | Freq: Once | INTRAVENOUS | Status: AC
  Administered 2011-09-26: 40 mg via INTRAVENOUS
  Filled 2011-09-26: qty 40

## 2011-09-26 MED ORDER — ASPIRIN 81 MG PO CHEW
CHEWABLE_TABLET | ORAL | Status: AC
  Administered 2011-09-26: 21:00:00
  Filled 2011-09-26: qty 4

## 2011-09-26 MED ORDER — NITROGLYCERIN 0.4 MG SL SUBL
SUBLINGUAL_TABLET | SUBLINGUAL | Status: AC
  Administered 2011-09-26 (×2)
  Filled 2011-09-26: qty 25

## 2011-09-26 MED ORDER — GI COCKTAIL ~~LOC~~
30.0000 mL | Freq: Once | ORAL | Status: AC
  Administered 2011-09-26: 30 mL via ORAL
  Filled 2011-09-26: qty 30

## 2011-09-26 NOTE — ED Notes (Signed)
ZOX:WRUE<AV> Expected date:<BR> Expected time: 7:49 PM<BR> Means of arrival:<BR> Comments:<BR> M50 - 78yoM Dark stools

## 2011-09-26 NOTE — ED Provider Notes (Signed)
History     CSN: [id number]  Arrival date & time 09/26/11  2000   First MD Initiated Contact with Patient 09/26/11 2014      Chief Complaint  Patient presents with  . Melena    (Consider location/radiation/quality/duration/timing/severity/associated sxs/prior treatment) HPI Comments: Patient here from [facility] with complaints of chest burning, nausea and vomiting x 2 - states that they also noted that his stools were black as well.  He states two episodes of NBNB vomit which he describes as clear.  He states that he has a burning sensation in his chest.  States that this has been going on since the vomiting.  He reports that he also gets short of breath and begins to sweat when this comes on.  He adamantly denies abdominal pain, though he has a history of SBO with resection in April of this year.  He also has a history of CVA with left sided weakness, as well as PE and DVT for which he is on coumadin for.  He reports no diarrhea or constipation, states that the nursing home has noted the dark stools just today.  He arrives with an initial heart rate of 150.  Patient is a 78 y.o. male presenting with chest pain. The history is provided by the patient, the spouse and a relative. No language interpreter was used.  Chest Pain The chest pain began 6 - 12 hours ago. Chest pain occurs constantly. The chest pain is unchanged. The pain is associated with breathing. At its most intense, the pain is at 8/10. The pain is currently at 8/10. The quality of the pain is described as burning. The pain radiates to the epigastrium. Chest pain is worsened by deep breathing. Primary symptoms include fatigue, shortness of breath, nausea and vomiting. Pertinent negatives for primary symptoms include no fever, no syncope, no cough, no wheezing, no palpitations, no abdominal pain, no dizziness and no altered mental status.  Associated symptoms include diaphoresis.  Pertinent negatives for associated  symptoms include no claudication, no lower extremity edema, no near-syncope, no numbness, no orthopnea, no paroxysmal nocturnal dyspnea and no weakness. He tried nothing for the symptoms. Risk factors include being elderly and male gender.  His past medical history is significant for DVT, hyperlipidemia, hypertension, PE and strokes.     Past Medical History  Diagnosis Date  . Asthma   . Hypertension   . Pulmonary embolism   . Peripheral artery disease   . Hypercholesteremia   . Bursitis of right shoulder   . DVT of leg (deep venous thrombosis)   . Stroke   . Cough   . Trouble swallowing   . Change in voice   . Difficulty urinating   . Easy bruising     Past Surgical History  Procedure Date  . Hernia repair   . Appendectomy   . Femoral-femoral bypass graft   . Iliac artery stent   . Esophagogastroduodenoscopy 07/03/2011    Procedure: ESOPHAGOGASTRODUODENOSCOPY (EGD);  Surgeon: Shirley Friar, MD;  Location: [facility];  Service: Endoscopy;  Laterality: N/A;  . Compression hip screw 06/26/2011    Procedure: COMPRESSION HIP;  Surgeon: Raymon Mutton, MD;  Location: [facility];  Service: Orthopedics;  Laterality: Right;  . Laparoscopy 07/10/2011    Procedure: LAPAROSCOPY DIAGNOSTIC;  Surgeon: Ardeth Sportsman, MD;  Location: [facility];  Service: General;  Laterality: N/A;  serousal repair,    No family history on file.  History  Substance Use Topics  .  Smoking status: Former Smoker    Quit date: 08/20/1993  . Smokeless tobacco: Never Used  . Alcohol Use: No      Review of Systems  Constitutional: Positive for diaphoresis and fatigue. Negative for fever.  Respiratory: Positive for shortness of breath. Negative for cough and wheezing.   Cardiovascular: Positive for chest pain. Negative for palpitations, orthopnea, claudication, syncope and near-syncope.  Gastrointestinal: Positive for nausea and vomiting. Negative for abdominal pain.  Neurological: Negative for dizziness,  weakness and numbness.  Psychiatric/Behavioral: Negative for altered mental status.  All other systems reviewed and are negative.    Allergies  Review of patient's allergies indicates no known allergies.  Home Medications   Current Outpatient Rx  Name Route Sig Dispense Refill  . CLOPIDOGREL BISULFATE 75 MG PO TABS Oral Take 75 mg by mouth daily.    Marland Kitchen DILTIAZEM HCL ER 180 MG PO CP24 Oral Take 180 mg by mouth daily.    Marland Kitchen BOOST PLUS PO LIQD Oral Take 237 mLs by mouth 3 (three) times daily with meals.    Marland Kitchen METOCLOPRAMIDE HCL 10 MG PO TABS Oral Take 10 mg by mouth 3 (three) times daily.    Marland Kitchen METOPROLOL TARTRATE 50 MG PO TABS Oral Take 1 tablet (50 mg total) by mouth 2 (two) times daily.    . ADULT MULTIVITAMIN W/MINERALS CH Oral Take 1 tablet by mouth daily.    . NON FORMULARY  30 mLs 2 (two) times daily. Provide Gold Regular Liquid - for wound healing.    Marland Kitchen PANTOPRAZOLE SODIUM 40 MG PO TBEC Oral Take 40 mg by mouth daily.    Marland Kitchen SIMVASTATIN 10 MG PO TABS Oral Take 10 mg by mouth daily.    Marland Kitchen TAMSULOSIN HCL 0.4 MG PO CAPS Oral Take 0.4 mg by mouth daily.    Marland Kitchen VITAMIN C 500 MG PO TABS Oral Take 500 mg by mouth 2 (two) times daily.    . WARFARIN SODIUM 10 MG PO TABS Oral Take 10 mg by mouth daily.    Marland Kitchen METOCLOPRAMIDE HCL 10 MG PO TABS Oral Take 1 tablet (10 mg total) by mouth 3 (three) times daily before meals.      There were no vitals taken for this visit.  Physical Exam  Nursing note and vitals reviewed. Constitutional: He is oriented to person, place, and time. He appears well-developed and well-nourished. No distress.  HENT:  Head: Normocephalic and atraumatic.  Right Ear: External ear normal.  Left Ear: External ear normal.  Nose: Nose normal.  Mouth/Throat: Oropharynx is clear and moist. No oropharyngeal exudate.  Eyes: EOM are normal. Pupils are equal, round, and reactive to light. No scleral icterus.       Pale conjunctiva  Neck: Normal range of motion. Neck supple.    Cardiovascular: Regular rhythm and normal heart sounds.  Exam reveals no gallop and no friction rub.   No murmur heard.      tachycardia  Pulmonary/Chest: Breath sounds normal. No respiratory distress. He has no wheezes. He has no rales. He exhibits no tenderness.       tachypnea  Abdominal: Soft. Bowel sounds are normal. He exhibits no distension. There is no tenderness. There is no rebound and no guarding.       Well healed surgical scars  Genitourinary: Guaiac positive stool.  Musculoskeletal: Normal range of motion. He exhibits no edema and no tenderness.  Lymphadenopathy:    He has no cervical adenopathy.  Neurological: He is alert and oriented to  person, place, and time. No cranial nerve deficit.       Left sided hemiparesis  Skin: Skin is warm and dry. There is pallor.  Psychiatric: He has a normal mood and affect. His behavior is normal. Judgment and thought content normal.    ED Course  Procedures (including critical care time)  Labs Reviewed  PROTIME-INR - Abnormal; Notable for the following:    Prothrombin Time 28.2 (*) RESULT CHECKED   INR 2.59 (*)    All other components within normal limits  CBC - Abnormal; Notable for the following:    WBC 13.3 (*)    RBC 3.79 (*)    Hemoglobin 9.7 (*)    HCT 31.3 (*)    MCH 25.6 (*)    RDW 16.3 (*)    Platelets 416 (*)    All other components within normal limits  OCCULT BLOOD, POC DEVICE  COMPREHENSIVE METABOLIC PANEL  TYPE AND SCREEN  DIFFERENTIAL   No results found. Results for orders placed during the hospital encounter of 09/26/11  PROTIME-INR      Component Value Range   Prothrombin Time 28.2 (*) 11.6 - 15.2 (seconds)   INR 2.59 (*) 0.00 - 1.49   CBC      Component Value Range   WBC 13.3 (*) 4.0 - 10.5 (K/uL)   RBC 3.79 (*) 4.22 - 5.81 (MIL/uL)   Hemoglobin 9.7 (*) 13.0 - 17.0 (g/dL)   HCT 16.1 (*) 09.6 - 52.0 (%)   MCV 82.6  78.0 - 100.0 (fL)   MCH 25.6 (*) 26.0 - 34.0 (pg)   MCHC 31.0  30.0 - 36.0 (g/dL)    RDW 04.5 (*) 40.9 - 15.5 (%)   Platelets 416 (*) 150 - 400 (K/uL)  COMPREHENSIVE METABOLIC PANEL      Component Value Range   Sodium 135  135 - 145 (mEq/L)   Potassium 4.8  3.5 - 5.1 (mEq/L)   Chloride 100  96 - 112 (mEq/L)   CO2 26  19 - 32 (mEq/L)   Glucose, Bld 123 (*) 70 - 99 (mg/dL)   BUN 61 (*) 6 - 23 (mg/dL)   Creatinine, Ser 8.11  0.50 - 1.35 (mg/dL)   Calcium 9.3  8.4 - 91.4 (mg/dL)   Total Protein 7.0  6.0 - 8.3 (g/dL)   Albumin 3.0 (*) 3.5 - 5.2 (g/dL)   AST 32  0 - 37 (U/L)   ALT 21  0 - 53 (U/L)   Alkaline Phosphatase 64  39 - 117 (U/L)   Total Bilirubin 0.1 (*) 0.3 - 1.2 (mg/dL)   GFR calc non Af Amer 71 (*) >90 (mL/min)   GFR calc Af Amer 82 (*) >90 (mL/min)  TYPE AND SCREEN      Component Value Range   ABO/RH(D) A POS     Antibody Screen NEG     Sample Expiration 09/29/2011    OCCULT BLOOD, POC DEVICE      Component Value Range   Fecal Occult Bld POSITIVE    DIFFERENTIAL      Component Value Range   Neutrophils Relative 65  43 - 77 (%)   Neutro Abs 8.7 (*) 1.7 - 7.7 (K/uL)   Lymphocytes Relative 28  12 - 46 (%)   Lymphs Abs 3.7  0.7 - 4.0 (K/uL)   Monocytes Relative 6  3 - 12 (%)   Monocytes Absolute 0.7  0.1 - 1.0 (K/uL)   Eosinophils Relative 2  0 - 5 (%)  Eosinophils Absolute 0.2  0.0 - 0.7 (K/uL)   Basophils Relative 0  0 - 1 (%)   Basophils Absolute 0.0  0.0 - 0.1 (K/uL)  LACTIC ACID, PLASMA      Component Value Range   Lactic Acid, Venous 1.6  0.5 - 2.2 (mmol/L)  POCT I-STAT TROPONIN I      Component Value Range   Troponin i, poc 1.68 (*) 0.00 - 0.08 (ng/mL)   Comment NOTIFIED PHYSICIAN     Comment 3            Dg Chest Portable 1 View  09/26/2011  *RADIOLOGY REPORT*  Clinical Data: Chest burning, productive cough.  PORTABLE CHEST - 1 VIEW  Comparison: 07/28/2011  Findings: Shallow inspiration.  Linear fibrosis or atelectasis in the lung bases, greatest on the right.  This appears stable since the previous study.  Normal heart size and  pulmonary vascularity. No focal airspace consolidation in the lungs.  No blunting of costophrenic angles.  No pneumothorax.  IMPRESSION: Shallow inspiration with linear fibrosis or atelectasis in the lung bases, similar to previous study.  No focal consolidation.  Original Report Authenticated By: Marlon Pel, M.D.   CRITICAL CARE Performed by: Patrecia Pour.   Total critical care time: 30 minutes  Critical care time was exclusive of separately billable procedures and treating other patients.  Critical care was necessary to treat or prevent imminent or life-threatening deterioration.  Critical care was time spent personally by me on the following activities: development of treatment plan with patient and/or surrogate as well as nursing, discussions with consultants, evaluation of patient's response to treatment, examination of patient, obtaining history from patient or surrogate, ordering and performing treatments and interventions, ordering and review of laboratory studies, ordering and review of radiographic studies, pulse oximetry and re-evaluation of patient's condition.    Upper GI bleed NSTEMI Tachycardia   MDM  Patient with multiple chronic medical problems presents with complaints of burning type chest pain, nausea with two episodes of NBNB vomiting.  He denies abdominal pain with this but was noted with melena stools on coumadin.  INR therapeutic at this time but noted with a troponin leak of 1.68.  Dr. Jeraldine Loots, with whom I am working with this patient has spoken with Critical Care who will admit.       Izola Price [place], [place] 09/26/11 [id number]

## 2011-09-26 NOTE — ED Notes (Signed)
Per EMS: patient from [facility]. Patient sts he has been having abd pain, nausea, vomiting and dark tarry stools since 1 hour ago. Patient sts vomit was clear. Pt tachycardic at this time with HR at 150. IV established by EMS.

## 2011-09-27 ENCOUNTER — Encounter (HOSPITAL_COMMUNITY): Payer: Self-pay | Admitting: *Deleted

## 2011-09-27 ENCOUNTER — Inpatient Hospital Stay (HOSPITAL_COMMUNITY): Payer: Medicare HMO

## 2011-09-27 DIAGNOSIS — I82409 Acute embolism and thrombosis of unspecified deep veins of unspecified lower extremity: Secondary | ICD-10-CM | POA: Diagnosis present

## 2011-09-27 DIAGNOSIS — I214 Non-ST elevation (NSTEMI) myocardial infarction: Secondary | ICD-10-CM | POA: Diagnosis present

## 2011-09-27 DIAGNOSIS — I200 Unstable angina: Secondary | ICD-10-CM

## 2011-09-27 DIAGNOSIS — K922 Gastrointestinal hemorrhage, unspecified: Secondary | ICD-10-CM

## 2011-09-27 DIAGNOSIS — Z87448 Personal history of other diseases of urinary system: Secondary | ICD-10-CM | POA: Diagnosis present

## 2011-09-27 DIAGNOSIS — Z86711 Personal history of pulmonary embolism: Secondary | ICD-10-CM

## 2011-09-27 DIAGNOSIS — I639 Cerebral infarction, unspecified: Secondary | ICD-10-CM | POA: Diagnosis present

## 2011-09-27 DIAGNOSIS — D649 Anemia, unspecified: Secondary | ICD-10-CM

## 2011-09-27 DIAGNOSIS — Z8719 Personal history of other diseases of the digestive system: Secondary | ICD-10-CM

## 2011-09-27 LAB — MAGNESIUM: Magnesium: 2 mg/dL (ref 1.5–2.5)

## 2011-09-27 LAB — BASIC METABOLIC PANEL
CO2: 21 mEq/L (ref 19–32)
Chloride: 108 mEq/L (ref 96–112)
Creatinine, Ser: 1 mg/dL (ref 0.50–1.35)
Potassium: 4.6 mEq/L (ref 3.5–5.1)

## 2011-09-27 LAB — PREPARE RBC (CROSSMATCH)

## 2011-09-27 LAB — CBC
MCHC: 32.7 g/dL (ref 30.0–36.0)
MCV: 82.5 fL (ref 78.0–100.0)
Platelets: 336 10*3/uL (ref 150–400)
RBC: 2.63 MIL/uL — ABNORMAL LOW (ref 4.22–5.81)
RDW: 15.8 % — ABNORMAL HIGH (ref 11.5–15.5)
WBC: 11.9 10*3/uL — ABNORMAL HIGH (ref 4.0–10.5)

## 2011-09-27 LAB — CARDIAC PANEL(CRET KIN+CKTOT+MB+TROPI)
CK, MB: 5.1 ng/mL — ABNORMAL HIGH (ref 0.3–4.0)
Total CK: 75 U/L (ref 7–232)

## 2011-09-27 LAB — PHOSPHORUS: Phosphorus: 2 mg/dL — ABNORMAL LOW (ref 2.3–4.6)

## 2011-09-27 MED ORDER — NITROGLYCERIN 0.4 MG SL SUBL
SUBLINGUAL_TABLET | SUBLINGUAL | Status: AC
  Filled 2011-09-27: qty 25

## 2011-09-27 MED ORDER — CHLORHEXIDINE GLUCONATE CLOTH 2 % EX PADS
6.0000 | MEDICATED_PAD | Freq: Every day | CUTANEOUS | Status: DC
  Administered 2011-09-27: 6 via TOPICAL

## 2011-09-27 MED ORDER — METOPROLOL TARTRATE 1 MG/ML IV SOLN
INTRAVENOUS | Status: AC
  Filled 2011-09-27: qty 5

## 2011-09-27 MED ORDER — METOPROLOL TARTRATE 50 MG PO TABS
50.0000 mg | ORAL_TABLET | Freq: Two times a day (BID) | ORAL | Status: DC
  Administered 2011-09-27 – 2011-09-30 (×7): 50 mg via ORAL
  Filled 2011-09-27 (×8): qty 1

## 2011-09-27 MED ORDER — SODIUM CHLORIDE 0.9 % IV SOLN
250.0000 mL | INTRAVENOUS | Status: DC | PRN
  Administered 2011-09-27: 12:00:00 via INTRAVENOUS

## 2011-09-27 MED ORDER — NITROGLYCERIN 0.4 MG SL SUBL
0.4000 mg | SUBLINGUAL_TABLET | SUBLINGUAL | Status: DC | PRN
  Administered 2011-09-27 – 2011-09-28 (×7): 0.4 mg via SUBLINGUAL

## 2011-09-27 MED ORDER — NITROGLYCERIN IN D5W 200-5 MCG/ML-% IV SOLN
INTRAVENOUS | Status: AC
  Administered 2011-09-27: 50000 ug
  Filled 2011-09-27: qty 250

## 2011-09-27 MED ORDER — MUPIROCIN 2 % EX OINT
1.0000 "application " | TOPICAL_OINTMENT | Freq: Two times a day (BID) | CUTANEOUS | Status: AC
  Administered 2011-09-27 – 2011-10-01 (×10): 1 via NASAL
  Filled 2011-09-27: qty 22

## 2011-09-27 MED ORDER — METOPROLOL TARTRATE 1 MG/ML IV SOLN
2.5000 mg | Freq: Once | INTRAVENOUS | Status: AC
  Administered 2011-09-27: 2.5 mg via INTRAVENOUS

## 2011-09-27 MED ORDER — MORPHINE SULFATE 2 MG/ML IJ SOLN
2.0000 mg | Freq: Once | INTRAMUSCULAR | Status: AC
  Administered 2011-09-27: 2 mg via INTRAVENOUS

## 2011-09-27 MED ORDER — SIMVASTATIN 10 MG PO TABS
10.0000 mg | ORAL_TABLET | Freq: Every day | ORAL | Status: DC
  Administered 2011-09-27 – 2011-10-11 (×15): 10 mg via ORAL
  Filled 2011-09-27 (×17): qty 1

## 2011-09-27 MED ORDER — METOPROLOL TARTRATE 1 MG/ML IV SOLN: 2.5000 mg | Freq: Once | INTRAVENOUS | Status: DC

## 2011-09-27 MED ORDER — VITAMIN K1 10 MG/ML IJ SOLN
10.0000 mg | Freq: Once | INTRAVENOUS | Status: AC
  Administered 2011-09-27: 10 mg via INTRAVENOUS
  Filled 2011-09-27 (×2): qty 1

## 2011-09-27 MED ORDER — NITROGLYCERIN IN D5W 200-5 MCG/ML-% IV SOLN
5.0000 ug/min | INTRAVENOUS | Status: DC
  Administered 2011-09-27: 10 ug/min via INTRAVENOUS

## 2011-09-27 MED ORDER — SODIUM CHLORIDE 0.9 % IV SOLN
8.0000 mg/h | INTRAVENOUS | Status: DC
  Administered 2011-09-27 – 2011-09-29 (×5): 8 mg/h via INTRAVENOUS
  Filled 2011-09-27 (×12): qty 80

## 2011-09-27 MED ORDER — METOPROLOL TARTRATE 1 MG/ML IV SOLN
INTRAVENOUS | Status: AC
  Administered 2011-09-27: 2.5 mg
  Filled 2011-09-27: qty 5

## 2011-09-27 MED ORDER — MORPHINE SULFATE 2 MG/ML IJ SOLN
1.0000 mg | INTRAMUSCULAR | Status: DC | PRN
  Administered 2011-09-27 – 2011-09-30 (×3): 1 mg via INTRAVENOUS
  Filled 2011-09-27 (×3): qty 1

## 2011-09-27 MED ORDER — SODIUM CHLORIDE 0.9 % IV SOLN: Freq: Once | INTRAVENOUS | Status: DC

## 2011-09-27 MED ORDER — ONDANSETRON HCL 4 MG/2ML IJ SOLN
4.0000 mg | Freq: Three times a day (TID) | INTRAMUSCULAR | Status: DC | PRN
  Administered 2011-09-27 – 2011-09-30 (×2): 4 mg via INTRAVENOUS
  Filled 2011-09-27 (×2): qty 2

## 2011-09-27 NOTE — Consult Note (Signed)
[name], MONNIER NO.:  [id number]  MEDICAL RECORD NO.:  [id number]  LOCATION:  1225                         FACILITY:  [facility]  PHYSICIAN:  Natasha Bence, MD       DATE OF BIRTH:  Jun 26, 1933  DATE OF CONSULTATION:  09/27/2011 DATE OF DISCHARGE:                                CONSULTATION   REQUESTING PHYSICIAN:  Orbie Hurst, MD, Pulmonary Critical Care.  REASON FOR CONSULTATION:  Patient with non-ST elevation MI.  HISTORY OF PRESENT ILLNESS:  [name] is a 78 year old black male with a history of peripheral vascular disease and hypertension and stroke, who presented to an outside hospital yesterday with chest discomfort and melena.  He is chronically anticoagulated on Coumadin for his AFib and a history of a DVT.  He was found to be tachycardic at the outside hospital and also found to be anemic.  He was admitted to the ICU for further management of presumed GI bleed.  The patient reports several episodes of chest discomfort.  He had a severe burning sensation across his chest yesterday during his presenting episode.  He does report similar episodes like this several times, usually occurring in the morning before he arises from bed.  He report a deep pressure sensation across his chest.  Sometimes, it burns into his left arm.  It is also associated with shortness of breath and diaphoresis.  He reports he gets these fairly frequently.  He is going to rehab for his previous stroke and he does not report any of these with exertion; however, these symptoms that sound like angina last about 30 minutes.  It happens fairly frequently early in the morning.  Yesterday, he was given nitroglycerin for one of those episodes and it worked with significant relief.  Otherwise, he has tried Mylanta without much relief.  He denies any increasing dyspnea on exertion, any PND or orthopnea.  He has not had any palpitations lightheadedness.  Denies any other bleeding  besides his melanic stools.  Currently, he is pain free and feels much better after receiving blood.  He denies any fevers, chills.  Rest of the review of systems was negative except for stated above.  PAST MEDICAL HISTORY: 1. Chronic atrial fibrillation, on Coumadin. 2. History of DVT. 3. History of stroke. 4. History of right hip fracture. 5. Peripheral vascular disease. 6. Dyslipidemia.  PAST SURGICAL HISTORY:  He has had an appendectomy, hernia repair,femorofemoral bypass, and iliac stenting.  Also had EGD, laparoscopy, and hip surgery.  FAMILY HISTORY:  No known coronary artery disease.  SOCIAL HISTORY:  He is a former smoker, quit 18 years ago.  Denies any alcohol or illicit drug use.  HOME MEDICATIONS:  He is on Plavix 75 mg p.o. daily, diltiazem XR 180 mg p.o. daily, Reglan 10 mg p.o. t.i.d., Lopressor 50 mg p.o. b.i.d., multivitamin daily, Protonix 40 mg p.o. daily, Zocor 10 mg p.o. daily, Flomax 0.4 mg p.o. daily, vitamin C 500 mg p.o. b.i.d., Coumadin 10 mg p.o. daily.  ALLERGIES:  He has no known drug allergies.  PHYSICAL EXAMINATION:  VITAL SIGNS:  Currently, he is afebrile, temperature 97.6, pulse of 104 and regular, respiratory rate  19, blood pressure 119/65, O2 sats 99% on room air. GENERAL:  He is an elderly white male, in no apparent distress. HEENT:  Eyes is anicteric sclerae. NECK:  Normal jugular venous pressure.  No carotid bruits. LUNGS:  Clear to auscultation bilaterally. CARDIOVASCULAR:  He is tachycardic, regular.  No murmurs, rubs, or gallops. ABDOMEN:  Soft, nontender, nondistended.  No masses. EXTREMITIES:  Warm.  No edema. NEURO:  He has a dense right hemiparesis and facial droop from his previous stroke.  Otherwise, he is awake, alert, and oriented. SKIN:  No rashes or ulcers.  LABORATORY DATA:  Sodium 137, potassium 4.6, chloride 108, bicarb 21, BUN of 62, creatinine 1, calcium 8.4, magnesium of 2, glucose 123. White blood cell count  11.9, hematocrit 21.7, platelet count 336. Troponin point of care was 1.68.  Chest x-ray showed atelectasis or fibrosis in the lung bases, otherwise no infiltrates or effusions.  EKG shows sinus tachycardia, rate of 122 beats per minute.  He has anterolateral ST depressions suggestive of ischemia.  IMPRESSION AND PLAN:  This is a 78 year old black male with history of chronic atrial fibrillation, stroke, and deep venous thrombosis, who presents with chest discomfort, melena.  His chest discomfort is typical of angina.  He also has elevated troponins and ischemic changes on his EKG.  He does describe what sounds like anginal episodes in the mornings that have been fairly stable and consistent.  He has underlying coronary artery disease, likely exacerbated by his profound anemia.  Primary team has begun to address this with the blood transfusion and PPIs.  At this point in time, I agree with current management and reversing of his anticoagulants given the acute GI bleed and severity of it.  At one point, he was hypotensive and tachycardic with hematocrit of 21.7. Currently, he is chest pain-free and hemodynamically stable with improved blood pressure and heart rate.  At this point, we will continue supportive management and symptomatic treatment with nitroglycerin and/or morphine as blood pressure tolerates if he has recurrent pain. When stable from a bleeding standpoint, ideally he would be on aspirin and beta-blocker, which those could be restarted whenever he is more stable.  Otherwise, statin therapy would be reasonable in this situation.  Please call with any questions.  We will continue to follow.          ______________________________ Natasha Bence, MD     MH/MEDQ  D:  09/27/2011  T:  09/27/2011  Job:  [id number]

## 2011-09-27 NOTE — H&P (Signed)
Name: [name] MRN: [id number] DOB: 1933/09/17    LOS: 1  PULMONARY / CRITICAL CARE MEDICINE  HPI:  Patient is a 78 y/o M with extensive PMHx including stroke, R hip fracture, Hx of PE on anticoag with Warfarin, A fib, DVT and peripheral vascular disease who was brought to [facility] from [facility] due to burning chest pain and melanotic stools.  As per patient and medical records, pt has been undergoing rehab due to stroke and R hjip fracture s/p fixation, and he has been on chronic anticoag with coumadin and ASA and plavix for PVD and stroke. He reports episodes of dark stools on 05/25 and was found to have a HR of 150's in the ER and Hb 9.7. Further workup showed troponins of 1.68, INR 2.59. Pt was given ASA and Nitroglycerin and the chest pain improved. Critical care consultation was requested for further management.    Past Medical History  Diagnosis Date  . Asthma   . Hypertension   . Pulmonary embolism   . Peripheral artery disease   . Hypercholesteremia   . Bursitis of right shoulder   . DVT of leg (deep venous thrombosis)   . Stroke   . Cough   . Trouble swallowing   . Change in voice   . Difficulty urinating   . Easy bruising    Past Surgical History  Procedure Date  . Hernia repair   . Appendectomy   . Femoral-femoral bypass graft   . Iliac artery stent   . Esophagogastroduodenoscopy 07/03/2011    Procedure: ESOPHAGOGASTRODUODENOSCOPY (EGD);  Surgeon: Shirley Friar, MD;  Location: [facility];  Service: Endoscopy;  Laterality: N/A;  . Compression hip screw 06/26/2011    Procedure: COMPRESSION HIP;  Surgeon: Raymon Mutton, MD;  Location: [facility];  Service: Orthopedics;  Laterality: Right;  . Laparoscopy 07/10/2011    Procedure: LAPAROSCOPY DIAGNOSTIC;  Surgeon: Ardeth Sportsman, MD;  Location: [facility];  Service: General;  Laterality: N/A;  serousal repair,   Prior to Admission medications   Medication Sig Start Date End Date Taking? Authorizing  Provider  clopidogrel (PLAVIX) 75 MG tablet Take 75 mg by mouth daily.   Yes Historical Provider, MD  diltiazem (DILACOR XR) 180 MG 24 hr capsule Take 180 mg by mouth daily.   Yes Historical Provider, MD  lactose free nutrition (BOOST PLUS) LIQD Take 237 mLs by mouth 3 (three) times daily with meals. 08/04/11  Yes Katy Apo, MD  metoCLOPramide (REGLAN) 10 MG tablet Take 10 mg by mouth 3 (three) times daily.   Yes Historical Provider, MD  metoprolol (LOPRESSOR) 50 MG tablet Take 1 tablet (50 mg total) by mouth 2 (two) times daily. 08/04/11 08/03/12 Yes Katy Apo, MD  Multiple Vitamin (MULITIVITAMIN WITH MINERALS) TABS Take 1 tablet by mouth daily.   Yes Historical Provider, MD  NON FORMULARY 30 mLs 2 (two) times daily. Provide Gold Regular Liquid - for wound healing.   Yes Historical Provider, MD  pantoprazole (PROTONIX) 40 MG tablet Take 40 mg by mouth daily.   Yes Historical Provider, MD  simvastatin (ZOCOR) 10 MG tablet Take 10 mg by mouth daily.   Yes Historical Provider, MD  Tamsulosin HCl (FLOMAX) 0.4 MG CAPS Take 0.4 mg by mouth daily.   Yes Historical Provider, MD  vitamin C (ASCORBIC ACID) 500 MG tablet Take 500 mg by mouth 2 (two) times daily.   Yes Historical Provider, MD  warfarin (COUMADIN) 10 MG tablet Take 10  mg by mouth daily.   Yes Historical Provider, MD  metoCLOPramide (REGLAN) 10 MG tablet Take 1 tablet (10 mg total) by mouth 3 (three) times daily before meals. 08/04/11 08/14/11  Katy Apo, MD   Allergies No Known Allergies  Family History No family history on file. Social History  reports that he quit smoking about 18 years ago. He has never used smokeless tobacco. He reports that he does not drink alcohol or use illicit drugs.  Review Of Systems:  All other systems were negative except as above in HPI    Current Status:  Vital Signs: Pulse Rate:  [123-147] 144  (05/26 0130) Resp:  [14-30] 28  (05/26 0130) BP: (97-116)/(50-60) 116/50 mmHg (05/25  2216) SpO2:  [92 %-100 %] 98 % (05/26 0130)  Physical Examination: General:  Pt is alert, oriented x3, in no acute respiratory distress Neuro:  Alert, oriented x3, L hemiparesis HEENT:  No JVD Cardiovascular:  Tachycardic rhythm. S1 and S2 normal Lungs:  Mildly decreased breath sounds at the pulm bases, no crackles or wheezes. Abdomen:  Soft, non tender, non distended. BS+, no peritoneal signs Skin:  No rash    ASSESSMENT AND PLAN  PULMONARY No results found for this basename: PHART:5,PCO2:5,PCO2ART:5,PO2ART:5,HCO3:5,O2SAT:5 in the last 168 hours   A: Hx of PE/DVT   Hx of asthma - V/Q scan positive for PE and Hx of DVT P: - Pt with GI bleed - No candidate for anticoagulation - Will discuss  IVC filter placement  - No active wheezing at this time. - Bronchodilators  CARDIOVASCULAR  Lab 09/26/11 2111  TROPONINI --  LATICACIDVEN 1.6  PROBNP --   ECG:  Old Q waves, no ST changes Lines: peripheral access x2  A: Acute coronary syndrome Likely demand ischemia due to anemia due to GI bleed P: - Blood transfusion -Pt is not a candidate for anticoagulation due to GI bleed - INR reversal - Will consider B blockers if tachycardia persists despite blood transfusion - cardiology consult - Echo pending  GASTROINTESTINAL  Lab 09/26/11 2001  AST 32  ALT 21  ALKPHOS 64  BILITOT 0.1*  PROT 7.0  ALBUMIN 3.0*    A:  GI bleed - Pt with Hx of upper GI bleed s/p EGD on 07/03/11 by Dr Bosie Clos With Duodenal bulb benign-appearing lesion  -s/p biopsy. Suspect pancreatic rest (ectopic pancreatic tissue) and no source of bleeding seen  P:  - Blood transfusion - INR reversal with FFP - Vit K - GI on consult - Protonix drip - EGD in the morning  HEMATOLOGIC  Lab 09/26/11 2001  HGB 9.7*  HCT 31.3*  PLT 416*  INR 2.59*  APTT --   A:  Anemia due to GI bleeding P: PRBCs transfusion, FFP, vit K - Follow CBC   NEUROLOGIC  A:  Hx of stroke P:  On ASA, plavix at home  and coumadin for PE/DVT - Hold ASA/Plavix due to active GI bleed   BEST PRACTICE / DISPOSITION - Level of Care:  ICU - Primary Service:  PCCM - Consultants:  GI, cardiology - Code Status:  Full code - Diet:  NPO - DVT Px:  SCDs - GI Px:  Eliezer Lofts, M.D. Pulmonary and Critical Care Medicine [facility] Pager: [phone]  09/27/2011, 1:44 AM

## 2011-09-27 NOTE — Progress Notes (Signed)
  Echocardiogram 2D Echocardiogram has been performed.  Laural Benes, Acy Orsak A 09/27/2011, 9:02 AM

## 2011-09-27 NOTE — ED Provider Notes (Signed)
Medical screening examination/treatment/procedure(s) were conducted as a shared visit with non-physician practitioner(s) and myself.  I personally evaluated the patient during the encounter This 58 old male presents with chest pain, melena.  Notably, the patient is anticoagulated due to a history of dvt/pe.  If symptoms began earlier in the day, since onset has been persistent, with multiple bloody stool.  On my exam the patient is in no distress though he continues to complain of ongoing chest pain.  He has no abdominal pain.  He is tachycardic with a heart rate in the 130s, sinus tachycardia, abnormal.  The patient is not hypoxic, though he is on nasal cannula.  He is satting 100%, which is abnormal due to the nasal cannula.  Immediately following the patient's arrival, obtaining vital signs, the notation of melena, he began to be resuscitated with IV fluids, Protonix.  The patient's labs are notable for a troponin suggestive of ongoing ischemia, though absent notable EKG changes, this likely represented demand ischemia.  The patient was therapeutic with his Coumadin use.  Given the ongoing coronary ischemia consideration, his history of PE/DVT, and the active GI bleed, the patient was most appropriate for ongoing care in the ICU.  I discussed the patient's case with the critical care team.  Throughout the patient's evaluation myself and the physician assistant has been managing his symptoms.   Date: 09/27/2011  Rate: 150  Rhythm: sinus tachycardia  QRS Axis: left  Intervals: normal  ST/T Wave abnormalities: nonspecific ST/T changes  Conduction Disutrbances:none  Narrative Interpretation:   Old EKG Reviewed: changes noted ABNORMAL  CRITICAL CARE Performed by: Gerhard Munch   Total critical care time: 35  Critical care time was exclusive of separately billable procedures and treating other patients.  Critical care was necessary to treat or prevent imminent or life-threatening  deterioration.  Critical care was time spent personally by me on the following activities: development of treatment plan with patient and/or surrogate as well as nursing, discussions with consultants, evaluation of patient's response to treatment, examination of patient, obtaining history from patient or surrogate, ordering and performing treatments and interventions, ordering and review of laboratory studies, ordering and review of radiographic studies, pulse oximetry and re-evaluation of patient's condition.    Gerhard Munch, MD 09/27/11 1810

## 2011-09-27 NOTE — Progress Notes (Signed)
eLink Physician-Brief Progress Note Patient Name: Deny Chevez DOB: 03/18/1934 MRN: [id number]  Date of Service  09/27/2011   HPI/Events of Note  Pt. Continues to complain of central chest pain despite getting morphine 2 mg IV.   eICU Interventions  - Will get a stat EKG and chest x-ray. - S/L nitroglycerine - Beta blocker to decrease the HR.      Catha Brow 09/27/2011, 4:51 AM

## 2011-09-27 NOTE — Progress Notes (Signed)
Pt admitted earlier this AM by Dr Synetta Fail. I reviewed his current status and therapies with bedside RN. Has been seen by Cards and GI. Plan EGD 5/27  Billy Fischer, MD;  PCCM service; Mobile [phone]

## 2011-09-27 NOTE — Progress Notes (Signed)
Came to see patient and it is apparent he is a patient of Dr. Hazle Coca at [place].  Will ask them to assume care.  Darden Palmer MD [facility]

## 2011-09-27 NOTE — ED Notes (Signed)
Report given to ICU. ICU unable to take patient without orders from critical care team. Critical care on call paged and stated that Orbie Hurst will be at ED soon to assess and write orders on patient. ICU notified that patient's admitting MD will be in soon to assess patient and write orders.

## 2011-09-27 NOTE — ED Notes (Signed)
Pt transferred to ICU

## 2011-09-27 NOTE — Progress Notes (Signed)
eLink Physician-Brief Progress Note Patient Name: [name] DOB: [birth year] MRN: [id number]  Date of Service  09/27/2011   HPI/Events of Note  Pt admitted with lower GI bleed, he is tachycardic and hypertensive. Also c/o chest pain, had troponin leak earlier. Cardiology aware and thought to be demand ischemia.    eICU Interventions  - Will cont. With blood transfusion to keep Hb at 10 - Cont. To cycle Hb and troponin. - Morphine 2 mg IV and Metoprolol 2.5mg  IV.      Catha Brow 09/27/2011, 4:36 AM

## 2011-09-27 NOTE — Consult Note (Signed)
[facility] Consultation Note  Referring Provider:  Orbie Hurst, MD Primary Care Physician:  Katy Apo, MD, MD  Reason for Consultation:  Blood in stool  HPI: [name] is a 78 y.o. male on chronic aspirin, clopidigrel and warfarin for stroke, atrial fibrillation, DVT and history of pulmonary embolism.  Recent stroke with left-hemiparesis and in rehab facility.  Yesterday, at rehab facility, apparently had several episodes of black stool.  Admitted to hospital, found to have tachycardia and non-STEMI which is being managed medically.  Continues to have some intermittent chest pain, which he tells me is relieved with nitroglycerin.  On admission, his coagulopathy was corrected.  He denies any further bowel movements since his admission last night.  He has no nausea, vomiting.  Had episode of hematochezia few months ago, and had endoscopy 07/2009 by Dr. Bosie Clos showing no bleeding source.  Colonoscopy August 2011 showed cecal and sigmoid diverticulosis, hemorrhoids, and hyperplastic polyps.   Past Medical History  Diagnosis Date  . Asthma   . Hypertension   . Pulmonary embolism   . Peripheral artery disease   . Hypercholesteremia   . Bursitis of right shoulder   . DVT of leg (deep venous thrombosis)   . Stroke   . Cough   . Trouble swallowing   . Change in voice   . Difficulty urinating   . Easy bruising     Past Surgical History  Procedure Date  . Hernia repair   . Appendectomy   . Femoral-femoral bypass graft   . Iliac artery stent   . Esophagogastroduodenoscopy 07/03/2011    Procedure: ESOPHAGOGASTRODUODENOSCOPY (EGD);  Surgeon: Shirley Friar, MD;  Location: [facility];  Service: Endoscopy;  Laterality: N/A;  . Compression hip screw 06/26/2011    Procedure: COMPRESSION HIP;  Surgeon: Raymon Mutton, MD;  Location: [facility];  Service: Orthopedics;  Laterality: Right;  . Laparoscopy 07/10/2011    Procedure: LAPAROSCOPY DIAGNOSTIC;  Surgeon: Ardeth Sportsman,  MD;  Location: [facility];  Service: General;  Laterality: N/A;  serousal repair,    Prior to Admission medications   Medication Sig Start Date End Date Taking? Authorizing Provider  clopidogrel (PLAVIX) 75 MG tablet Take 75 mg by mouth daily.   Yes Historical Provider, MD  diltiazem (DILACOR XR) 180 MG 24 hr capsule Take 180 mg by mouth daily.   Yes Historical Provider, MD  lactose free nutrition (BOOST PLUS) LIQD Take 237 mLs by mouth 3 (three) times daily with meals. 08/04/11  Yes Katy Apo, MD  metoCLOPramide (REGLAN) 10 MG tablet Take 10 mg by mouth 3 (three) times daily.   Yes Historical Provider, MD  metoprolol (LOPRESSOR) 50 MG tablet Take 1 tablet (50 mg total) by mouth 2 (two) times daily. 08/04/11 08/03/12 Yes Katy Apo, MD  Multiple Vitamin (MULITIVITAMIN WITH MINERALS) TABS Take 1 tablet by mouth daily.   Yes Historical Provider, MD  NON FORMULARY 30 mLs 2 (two) times daily. Provide Gold Regular Liquid - for wound healing.   Yes Historical Provider, MD  pantoprazole (PROTONIX) 40 MG tablet Take 40 mg by mouth daily.   Yes Historical Provider, MD  simvastatin (ZOCOR) 10 MG tablet Take 10 mg by mouth daily.   Yes Historical Provider, MD  Tamsulosin HCl (FLOMAX) 0.4 MG CAPS Take 0.4 mg by mouth daily.   Yes Historical Provider, MD  vitamin C (ASCORBIC ACID) 500 MG tablet Take 500 mg by mouth 2 (two) times daily.   Yes Historical Provider, MD  warfarin (COUMADIN) 10 MG tablet Take 10 mg by mouth daily.   Yes Historical Provider, MD  metoCLOPramide (REGLAN) 10 MG tablet Take 1 tablet (10 mg total) by mouth 3 (three) times daily before meals. 08/04/11 08/14/11  Katy Apo, MD    Current Facility-Administered Medications  Medication Dose Route Frequency Provider Last Rate Last Dose  . 0.9 %  sodium chloride infusion  250 mL Intravenous PRN Orbie Hurst, MD      . aspirin 81 MG chewable tablet           . Chlorhexidine Gluconate Cloth 2 % PADS 6 each  6 each Topical Q0600 Orbie Hurst,  MD   6 each at 09/27/11 1000  . gi cocktail (Maalox,Lidocaine,Donnatal)  30 mL Oral Once Scarlette Calico C. [place], [place]   30 mL at 09/26/11 2053  . metoprolol (LOPRESSOR) 1 MG/ML injection        2.5 mg at 09/27/11 0441  . metoprolol (LOPRESSOR) 1 MG/ML injection           . metoprolol (LOPRESSOR) injection 2.5 mg  2.5 mg Intravenous Once Catha Brow, MD   2.5 mg at 09/27/11 0439  . metoprolol (LOPRESSOR) injection 2.5 mg  2.5 mg Intravenous Once Catha Brow, MD      . morphine 2 MG/ML injection 1 mg  1 mg Intravenous Q4H PRN Orbie Hurst, MD   1 mg at 09/27/11 0434  . morphine 2 MG/ML injection 2 mg  2 mg Intravenous Once Catha Brow, MD   2 mg at 09/27/11 0438  . mupirocin ointment (BACTROBAN) 2 % 1 application  1 application Nasal BID Orbie Hurst, MD      . nitroGLYCERIN (NITROSTAT) 0.4 MG SL tablet           . nitroGLYCERIN (NITROSTAT) 0.4 MG SL tablet           . nitroGLYCERIN (NITROSTAT) SL tablet 0.4 mg  0.4 mg Sublingual Q5 Min x 3 PRN Catha Brow, MD   0.4 mg at 09/27/11 0450  . ondansetron (ZOFRAN) injection 4 mg  4 mg Intravenous Q8H PRN Orbie Hurst, MD   4 mg at 09/27/11 0244  . pantoprazole (PROTONIX) 80 mg in sodium chloride 0.9 % 250 mL infusion  8 mg/hr Intravenous Continuous Orbie Hurst, MD 25 mL/hr at 09/27/11 0800 8 mg/hr at 09/27/11 0800  . pantoprazole (PROTONIX) injection 40 mg  40 mg Intravenous Once Scarlette Calico C. [place], [place]   40 mg at 09/26/11 2053  . phytonadione (VITAMIN K) 10 mg in dextrose 5 % 50 mL IVPB  10 mg Intravenous Once Orbie Hurst, MD   10 mg at 09/27/11 0439    Allergies as of 09/26/2011  . (No Known Allergies)    History reviewed. No pertinent family history.  History   Social History  . Marital Status: Married    Spouse Name: N/A    Number of Children: N/A  . Years of Education: N/A   Occupational History  . Not on file.   Social History Main Topics  . Smoking status: Former Smoker    Quit date: 08/20/1993  . Smokeless tobacco: Never Used  . Alcohol  Use: No  . Drug Use: No  . Sexually Active: No   Other Topics Concern  . Not on file   Social History Narrative  . No narrative on file    Review of Systems: Positive = bold Gen: Denies any fever, chills, rigors, night sweats, anorexia, fatigue, weakness, malaise, involuntary weight loss,  and sleep disorder CV: Denies chest pain, angina, palpitations, syncope, orthopnea, PND, peripheral edema, and claudication. Resp: Denies dyspnea, cough, sputum, wheezing, coughing up blood. GI:  As detailed in HPI GU : Denies urinary burning, blood in urine, urinary frequency, urinary hesitancy, nocturnal urination, and urinary incontinence. MS: Denies joint pain or swelling.  Denies muscle weakness, cramps, atrophy.  Derm: Denies rash, itching, oral ulcerations, hives, unhealing ulcers.  Psych: Denies depression, anxiety, memory loss, suicidal ideation, hallucinations,  and confusion. Heme: Denies bruising, bleeding, and enlarged lymph nodes. Neuro:  Denies any headaches, dizziness, paresthesias. Left weakness post stroke. Endo:  Denies any problems with DM, thyroid, adrenal function.  Physical Exam: Vital signs in last 24 hours: Temp:  [97.4 F (36.3 C)-97.9 F (36.6 C)] 97.6 F (36.4 C) (05/26 0800) Pulse Rate:  [104-147] 109  (05/26 0800) Resp:  [13-34] 13  (05/26 0800) BP: (87-163)/(50-91) 108/56 mmHg (05/26 0800) SpO2:  [92 %-100 %] 98 % (05/26 0800) Weight:  [61.1 kg (134 lb 11.2 oz)-64.1 kg (141 lb 5 oz)] 64.1 kg (141 lb 5 oz) (05/26 0500) Last BM Date: 09/26/11 General:   Alert,  In no acute distress Head:  Normocephalic and atraumatic. Eyes:  Sclera clear, no icterus.   Conjunctiva slightly pale. Ears:  Normal auditory acuity. Nose:  No deformity, discharge,  or lesions. Mouth:  No deformity or lesions.  Oropharynx pink & moist. Neck:  Supple; no masses or thyromegaly. Lungs:  Clear throughout to auscultation.   No wheezes, crackles, or rhonchi. No acute distress. Heart:   Regular rate and rhythm; no murmurs, clicks, rubs,  or gallops. Abdomen:  Soft, nontender and nondistended. No masses, hepatosplenomegaly or hernias noted. Normal bowel sounds, without guarding, and without rebound.   Rectal:  Normal anal sphincter tone.  Dark brown stool.  No fresh blood or clots.  Solid stool palpated in rectal vault.  Msk:  Symmetrical without gross deformities. Normal posture. Pulses:  Normal pulses noted. Extremities:  Without clubbing or edema. Neurologic:  Alert and  oriented x4;  Left hemiparesis Skin:  Few ecchymoses. Psych:  Alert and cooperative. Normal mood and affect.   Lab Results:  Basename 09/27/11 0845 09/27/11 0136 09/26/11 2001  WBC 11.4* 11.9* 13.3*  HGB 8.6* 7.0* 9.7*  HCT 26.3* 21.7* 31.3*  PLT 201 336 416*   BMET  Basename 09/27/11 0136 09/26/11 2001  NA 137 135  K 4.6 4.8  CL 108 100  CO2 21 26  GLUCOSE 123* 123*  BUN 62* 61*  CREATININE 1.00 1.00  CALCIUM 8.4 9.3   LFT  Basename 09/26/11 2001  PROT 7.0  ALBUMIN 3.0*  AST 32  ALT 21  ALKPHOS 64  BILITOT 0.1*  BILIDIR --  IBILI --   PT/INR  Basename 09/26/11 2001  LABPROT 28.2*  INR 2.59*    Studies/Results: Dg Chest 1 View  09/27/2011  *RADIOLOGY REPORT*  Clinical Data: Chest pain and labored breathing.  CHEST - 1 VIEW  Comparison: 09/26/2011  Findings: Shallow inspiration.  Linear fibrosis or atelectasis in the lung bases, greater on the right.  No significant change since previous study.  Normal heart size and pulmonary vascularity.  The no focal airspace consolidation in the lungs.  No blunting of costophrenic angles.  No pneumothorax.  Degenerative changes in the spine.  IMPRESSION: Shallow inspiration with linear atelectasis or fibrosis in the lung bases.  Stable appearance since previous study.  Original Report Authenticated By: Marlon Pel, M.D.   Dg Chest Portable 1  View  09/26/2011  *RADIOLOGY REPORT*  Clinical Data: Chest burning, productive cough.   PORTABLE CHEST - 1 VIEW  Comparison: 07/28/2011  Findings: Shallow inspiration.  Linear fibrosis or atelectasis in the lung bases, greatest on the right.  This appears stable since the previous study.  Normal heart size and pulmonary vascularity. No focal airspace consolidation in the lungs.  No blunting of costophrenic angles.  No pneumothorax.  IMPRESSION: Shallow inspiration with linear fibrosis or atelectasis in the lung bases, similar to previous study.  No focal consolidation.  Original Report Authenticated By: Marlon Pel, M.D.   Impression:  1.  Melena, resolved after correction of coagulopathy. 2.  Acute blood loss anemia. 3.  Non-STEMI. 4.  Multiple medical problems, with chronic need for anticoagulation.  Plan:  1. Agree with withholding warfarin and clopidigrel at this time. 2. Serial CBCs with transfusions as needed. 3. Continue Protonix infusion. 4.  Assuming no further complications with his cardiac status, and his longterm need for anticoagulation, will plan on upper endoscopy tomorrow. 5.  Clear liquid diet ok for now in the meantime.   LOS: 1 day   Yanuel Tagg M  09/27/2011, 10:27 AM

## 2011-09-27 NOTE — Progress Notes (Signed)
CRITICAL VALUE ALERT  Critical value received:  Troponin 1.26  Date of notification:  09/27/11  Time of notification:  1910  Critical value read back:yes  Nurse who received alert:  Jackolyn Confer  MD notified (1st page): not paged, troponins elevated already, MD aware  Time of first page:    MD notified (2nd page):  Time of second page:  Responding MD:    Time MD responded:

## 2011-09-27 NOTE — Consult Note (Signed)
Reason for Consult: NSTEMI  Requesting Physician: Dr Donnie Aho  HPI: This is a 78 y.o. male with a past medical history significant for PVD. He has been followed by Dr Allyson Sabal for several years. We appreciate Dr York Spaniel notification. [name] has had prior L-R FFBPG 2001. He had subsequent RLE PTA in 3/09 and 8/11. He had a pulmonary embolism in July 2012. He also had DVT and was placed on Coumadin. We last saw him in Nov 2012. He has not had prior cardiac history. He was admitted in Feb 2013 after a fall and suffered a fx hip requiring surgery. That hospitalization was complicated by SBO, s/p lysis of adhesions, Rt brain CVA with Lt hemiparesis, GI bleeding, and acute renal insufficiency. He was discharged to a rehab facility. He was put back on Coumadin after he stabilized. He is admitted now with GI bleed- Hgb 7.0 on admission. There is a history of melana. He also has had some SSCP and his Troponin is positive - 1.68. His pain is better with NTG. His EKG shows no acute changes. He continues to have SSCP.   PMHx:  Past Medical History  Diagnosis Date  . Asthma   . Hypertension   . Pulmonary embolism   . Peripheral artery disease   . Hypercholesteremia   . Bursitis of right shoulder   . DVT of leg (deep venous thrombosis)   . Stroke   . Cough   . Trouble swallowing   . Change in voice   . Difficulty urinating   . Easy bruising    Past Surgical History  Procedure Date  . Hernia repair   . Appendectomy   . Femoral-femoral bypass graft   . Iliac artery stent   . Esophagogastroduodenoscopy 07/03/2011    Procedure: ESOPHAGOGASTRODUODENOSCOPY (EGD);  Surgeon: Shirley Friar, MD;  Location: [facility];  Service: Endoscopy;  Laterality: N/A;  . Compression hip screw 06/26/2011    Procedure: COMPRESSION HIP;  Surgeon: Raymon Mutton, MD;  Location: [facility];  Service: Orthopedics;  Laterality: Right;  . Laparoscopy 07/10/2011    Procedure: LAPAROSCOPY DIAGNOSTIC;  Surgeon: Ardeth Sportsman, MD;  Location: [facility];  Service: General;  Laterality: N/A;  serousal repair,    FAMHx: History reviewed. No pertinent family history.  SOCHx:  reports that he quit smoking about 18 years ago. He has never used smokeless tobacco. He reports that he does not drink alcohol or use illicit drugs.His wife is a pt of ours as well.  ALLERGIES: No Known Allergies  ROS: Pertinent items are noted in HPI.  HOME MEDICATIONS: Prescriptions prior to admission  Medication Sig Dispense Refill  . clopidogrel (PLAVIX) 75 MG tablet Take 75 mg by mouth daily.      Marland Kitchen diltiazem (DILACOR XR) 180 MG 24 hr capsule Take 180 mg by mouth daily.      Marland Kitchen lactose free nutrition (BOOST PLUS) LIQD Take 237 mLs by mouth 3 (three) times daily with meals.      . metoCLOPramide (REGLAN) 10 MG tablet Take 10 mg by mouth 3 (three) times daily.      . metoprolol (LOPRESSOR) 50 MG tablet Take 1 tablet (50 mg total) by mouth 2 (two) times daily.      . Multiple Vitamin (MULITIVITAMIN WITH MINERALS) TABS Take 1 tablet by mouth daily.      . NON FORMULARY 30 mLs 2 (two) times daily. Provide Gold Regular Liquid - for wound healing.      . pantoprazole (PROTONIX)  40 MG tablet Take 40 mg by mouth daily.      . simvastatin (ZOCOR) 10 MG tablet Take 10 mg by mouth daily.      . Tamsulosin HCl (FLOMAX) 0.4 MG CAPS Take 0.4 mg by mouth daily.      . vitamin C (ASCORBIC ACID) 500 MG tablet Take 500 mg by mouth 2 (two) times daily.      Marland Kitchen warfarin (COUMADIN) 10 MG tablet Take 10 mg by mouth daily.      . metoCLOPramide (REGLAN) 10 MG tablet Take 1 tablet (10 mg total) by mouth 3 (three) times daily before meals.        HOSPITAL MEDICATIONS: I have reviewed the patient's current medications.  VITALS: Blood pressure 108/56, pulse 109, temperature 97.6 F (36.4 C), temperature source Oral, resp. rate 13, height 5\' 7"  (1.702 m), weight 64.1 kg (141 lb 5 oz), SpO2 98.00%.  PHYSICAL EXAM: General appearance: alert, cooperative,  no distress and exspressive aphasia Neck: no JVD and supple, symmetrical, trachea midline Lungs: decreased at bases Heart: regular rate and rhythm and sinus tachycardia Abdomen: non tender, not distended Extremities: bilat FA surgical scar, no edema, decreased pulses Pulses: diminnished Skin: cool and dry Neurologic: Grossly normal, Lt hemiparesis, expressive aphasia  LABS: Results for orders placed during the hospital encounter of 09/26/11 (from the past 48 hour(s))  PROTIME-INR     Status: Abnormal   Collection Time   09/26/11  8:01 PM      Component Value Range Comment   Prothrombin Time 28.2 (*) 11.6 - 15.2 (seconds) RESULT CHECKED   INR 2.59 (*) 0.00 - 1.49    CBC     Status: Abnormal   Collection Time   09/26/11  8:01 PM      Component Value Range Comment   WBC 13.3 (*) 4.0 - 10.5 (K/uL)    RBC 3.79 (*) 4.22 - 5.81 (MIL/uL)    Hemoglobin 9.7 (*) 13.0 - 17.0 (g/dL)    HCT 40.9 (*) 81.1 - 52.0 (%)    MCV 82.6  78.0 - 100.0 (fL)    MCH 25.6 (*) 26.0 - 34.0 (pg)    MCHC 31.0  30.0 - 36.0 (g/dL)    RDW 91.4 (*) 78.2 - 15.5 (%)    Platelets 416 (*) 150 - 400 (K/uL)   COMPREHENSIVE METABOLIC PANEL     Status: Abnormal   Collection Time   09/26/11  8:01 PM      Component Value Range Comment   Sodium 135  135 - 145 (mEq/L)    Potassium 4.8  3.5 - 5.1 (mEq/L)    Chloride 100  96 - 112 (mEq/L)    CO2 26  19 - 32 (mEq/L)    Glucose, Bld 123 (*) 70 - 99 (mg/dL)    BUN 61 (*) 6 - 23 (mg/dL)    Creatinine, Ser 9.56  0.50 - 1.35 (mg/dL)    Calcium 9.3  8.4 - 10.5 (mg/dL)    Total Protein 7.0  6.0 - 8.3 (g/dL)    Albumin 3.0 (*) 3.5 - 5.2 (g/dL)    AST 32  0 - 37 (U/L)    ALT 21  0 - 53 (U/L)    Alkaline Phosphatase 64  39 - 117 (U/L)    Total Bilirubin 0.1 (*) 0.3 - 1.2 (mg/dL)    GFR calc non Af Amer 71 (*) >90 (mL/min)    GFR calc Af Amer 82 (*) >90 (mL/min)   TYPE AND SCREEN  Status: Normal (Preliminary result)   Collection Time   09/26/11  8:01 PM      Component Value  Range Comment   ABO/RH(D) A POS      Antibody Screen NEG      Sample Expiration 09/29/2011      Unit Number [id number]      Blood Component Type RED CELLS,LR      Unit division 00      Status of Unit ISSUED      Transfusion Status OK TO TRANSFUSE      Crossmatch Result Compatible      Unit Number [id number]      Blood Component Type RED CELLS,LR      Unit division 00      Status of Unit ISSUED      Transfusion Status OK TO TRANSFUSE      Crossmatch Result Compatible     DIFFERENTIAL     Status: Abnormal   Collection Time   09/26/11  8:01 PM      Component Value Range Comment   Neutrophils Relative 65  43 - 77 (%)    Neutro Abs 8.7 (*) 1.7 - 7.7 (K/uL)    Lymphocytes Relative 28  12 - 46 (%)    Lymphs Abs 3.7  0.7 - 4.0 (K/uL)    Monocytes Relative 6  3 - 12 (%)    Monocytes Absolute 0.7  0.1 - 1.0 (K/uL)    Eosinophils Relative 2  0 - 5 (%)    Eosinophils Absolute 0.2  0.0 - 0.7 (K/uL)    Basophils Relative 0  0 - 1 (%)    Basophils Absolute 0.0  0.0 - 0.1 (K/uL)   OCCULT BLOOD, POC DEVICE     Status: Normal   Collection Time   09/26/11  8:32 PM      Component Value Range Comment   Fecal Occult Bld POSITIVE     POCT I-STAT TROPONIN I     Status: Abnormal   Collection Time   09/26/11  8:52 PM      Component Value Range Comment   Troponin i, poc 1.68 (*) 0.00 - 0.08 (ng/mL)    Comment NOTIFIED PHYSICIAN      Comment 3            LACTIC ACID, PLASMA     Status: Normal   Collection Time   09/26/11  9:11 PM      Component Value Range Comment   Lactic Acid, Venous 1.6  0.5 - 2.2 (mmol/L)   PREPARE RBC (CROSSMATCH)     Status: Normal   Collection Time   09/27/11  1:34 AM      Component Value Range Comment   Order Confirmation ORDER PROCESSED BY BLOOD BANK     PREPARE FRESH FROZEN PLASMA     Status: Normal (Preliminary result)   Collection Time   09/27/11  1:34 AM      Component Value Range Comment   Unit Number [id number]      Blood Component Type THAWED PLASMA      Unit  division 00      Status of Unit ISSUED      Transfusion Status OK TO TRANSFUSE     CBC     Status: Abnormal   Collection Time   09/27/11  1:36 AM      Component Value Range Comment   WBC 11.9 (*) 4.0 - 10.5 (K/uL)    RBC 2.63 (*) 4.22 - 5.81 (MIL/uL)  Hemoglobin 7.0 (*) 13.0 - 17.0 (g/dL)    HCT 38.1 (*) 82.9 - 52.0 (%)    MCV 82.5  78.0 - 100.0 (fL)    MCH 26.6  26.0 - 34.0 (pg)    MCHC 32.3  30.0 - 36.0 (g/dL)    RDW 93.7 (*) 16.9 - 15.5 (%)    Platelets 336  150 - 400 (K/uL)   BASIC METABOLIC PANEL     Status: Abnormal   Collection Time   09/27/11  1:36 AM      Component Value Range Comment   Sodium 137  135 - 145 (mEq/L)    Potassium 4.6  3.5 - 5.1 (mEq/L)    Chloride 108  96 - 112 (mEq/L)    CO2 21  19 - 32 (mEq/L)    Glucose, Bld 123 (*) 70 - 99 (mg/dL)    BUN 62 (*) 6 - 23 (mg/dL)    Creatinine, Ser 6.78  0.50 - 1.35 (mg/dL)    Calcium 8.4  8.4 - 10.5 (mg/dL)    GFR calc non Af Amer 71 (*) >90 (mL/min)    GFR calc Af Amer 82 (*) >90 (mL/min)   MAGNESIUM     Status: Normal   Collection Time   09/27/11  1:36 AM      Component Value Range Comment   Magnesium 2.0  1.5 - 2.5 (mg/dL)   PHOSPHORUS     Status: Abnormal   Collection Time   09/27/11  1:36 AM      Component Value Range Comment   Phosphorus 2.0 (*) 2.3 - 4.6 (mg/dL)   MRSA PCR SCREENING     Status: Abnormal   Collection Time   09/27/11  2:42 AM      Component Value Range Comment   MRSA by PCR POSITIVE (*) NEGATIVE    CBC     Status: Abnormal   Collection Time   09/27/11  8:45 AM      Component Value Range Comment   WBC 11.4 (*) 4.0 - 10.5 (K/uL)    RBC 3.19 (*) 4.22 - 5.81 (MIL/uL)    Hemoglobin 8.6 (*) 13.0 - 17.0 (g/dL)    HCT 93.8 (*) 10.1 - 52.0 (%)    MCV 82.4  78.0 - 100.0 (fL)    MCH 27.0  26.0 - 34.0 (pg)    MCHC 32.7  30.0 - 36.0 (g/dL)    RDW 75.1 (*) 02.5 - 15.5 (%)    Platelets 201  150 - 400 (K/uL)     IMAGING: Dg Chest 1 View  09/27/2011  *RADIOLOGY REPORT*  Clinical Data: Chest pain  and labored breathing.  CHEST - 1 VIEW  Comparison: 09/26/2011  Findings: Shallow inspiration.  Linear fibrosis or atelectasis in the lung bases, greater on the right.  No significant change since previous study.  Normal heart size and pulmonary vascularity.  The no focal airspace consolidation in the lungs.  No blunting of costophrenic angles.  No pneumothorax.  Degenerative changes in the spine.  IMPRESSION: Shallow inspiration with linear atelectasis or fibrosis in the lung bases.  Stable appearance since previous study.  Original Report Authenticated By: Marlon Pel, M.D.      IMPRESSION: Principal Problem:  *GI bleed, recurrent, on Coumadin- hgb 7.0 on admission Active Problems:  NSTEMI (non-ST elevated myocardial infarction)  Chronic anticoagulation  DVT (deep venous thrombosis)  Fracture of hip, right, surg -Feb 2013  SBO (small bowel obstruction), lysis of adhesions Feb 2013  CVA, Rt  brain Feb 2013  History of GI bleed Feb 2013  History of renal insufficiency syndrome, 7/12 and 3/13- SCr Nl (1.0) now   RECOMMENDATION: MD to see. Will add IV NTG, Ibeta blocker on a scheduled dose, (his NPO has been lifted).  He has been transfused, and Coumadin reversed with FFP and Vit K. His LVF was 50-55% March 2013 with grade 1 diastolic dysfunction.   Time Spent Directly with Patient: 50 minutes  KILROY,LUKE K 09/27/2011, 11:09 AM   ATTENDING ATTESTATION:  I have seen and examined the patient along with Corine Shelter, PA.  I have reviewed the chart, notes and new data.  I agree with Luke's note.  Brief Description: 78 y/o well known to [facility] with long standing h/o PAD (s/p Fem-fem bypass & repeated Iliac PTA) who suffered a PE in 11/2010, then had a hip fxr with a very complicated post ORIF hospitalization in March 2013 00> CVA (with Left Hemiparesis),GIBld/Bowel Obstruction.  On Warfarin for PE & Plavix for PAD.   He was recently started on "injections" for a UTI ~1 week ago.  Since  then he has been noting recurrent "burning in his chest" that is made worse with PT & transferring.  This "burning" is associated with shortness of breath & orthopnea.  When not present, he denies any shortness of breath.  No palpitations.  He was found to have low Hgb c/w GI bleed & note to have recurrent chest burning with + troponin. Cardiovascular ROS: positive for - chest pain, dyspnea on exertion, orthopnea and shortness of breath negative for - edema, irregular heartbeat, loss of consciousness, murmur, palpitations or rapid heart rate   Very difficult situation with SSx of Angina (not sure if true ACS vs Anemia/Tachycardiac mediated supply vs. Demand ischemia in setting of GI bleed & likely existing CAD.    Our options our limited from an invasive standpoint b/c GI bleed precluding anticoagulation -- this also precludes Cath with PCI as PCi would require full dose anticoagulation & antiplatelet Rx.  He will certainly need an ischemic evaluation, but will need to be once his GI bleed situation is stable.    He is not currently noting CHF symptoms with orthopnea only while having "angina"  PLAN:  Would restart home BB - agree with lower dose to avoid hypotension; holding BB can result in rebound tachycardia.  IV NTG gtt  With active cardiac concerns, will transfuse 1 more unit with a  Goal of ~9-10 hgb.  Continue statin.  With no clear evidence of Afib & PE over 1 yr ago, I am not sure as to the urgency of re-initiating warfarin RX.  He did haveBilateral DVTs, so an IVC filter may need to be entertained.  Once he is more clinically stable, we will need to determine best COA re: ischemic evaluation.  Repeating an echo may help demonstrate new WMA or decreased EF.  We will follow.  He has asked for Dr. Allyson Sabal (his primary cardiologist) to see him tomorrow.  Marykay Lex, M.D., M.S. [facility] [address] [place], [place]   [address]  [phone]  09/27/2011 12:22 PM

## 2011-09-27 NOTE — H&P (Signed)
Please see dictated note for full details.  Patient with chronic afib and DVT on anticoagulation presented with angina and melena.  Otherwise, he reports stable almost daily anginal symptoms prior to this.  Troponin is elevated and he has anterolateral ST depression suggestive of ischemia.  He is pain free now after SL NTG and blood transfusion.  Agree with current management of NSTEMI in setting of profound anemia/GI bleed.  Would hold anticoagulants/antiplatelets and antihypertensive meds as you are.  When safe from bleeding standpoint would reinitiate ASA and beta blocker.  Would be reasonable to start statin back.  Please call with questions.  Thanks

## 2011-09-28 ENCOUNTER — Encounter (HOSPITAL_COMMUNITY): Payer: Self-pay

## 2011-09-28 ENCOUNTER — Encounter (HOSPITAL_COMMUNITY)
Admission: EM | Disposition: A | Discharge status: Skilled Nursing Facility | Payer: Self-pay | Source: Home / Self Care | Attending: Internal Medicine

## 2011-09-28 DIAGNOSIS — K922 Gastrointestinal hemorrhage, unspecified: Secondary | ICD-10-CM

## 2011-09-28 DIAGNOSIS — I214 Non-ST elevation (NSTEMI) myocardial infarction: Secondary | ICD-10-CM

## 2011-09-28 DIAGNOSIS — I2699 Other pulmonary embolism without acute cor pulmonale: Secondary | ICD-10-CM

## 2011-09-28 DIAGNOSIS — R000 Tachycardia, unspecified: Secondary | ICD-10-CM

## 2011-09-28 DIAGNOSIS — I82409 Acute embolism and thrombosis of unspecified deep veins of unspecified lower extremity: Secondary | ICD-10-CM

## 2011-09-28 HISTORY — PX: ESOPHAGOGASTRODUODENOSCOPY: SHX5428

## 2011-09-28 LAB — PREPARE FRESH FROZEN PLASMA

## 2011-09-28 LAB — CBC
HCT: 26.7 % — ABNORMAL LOW (ref 39.0–52.0)
Hemoglobin: 9.1 g/dL — ABNORMAL LOW (ref 13.0–17.0)
MCH: 28.3 pg (ref 26.0–34.0)
MCHC: 34.1 g/dL (ref 30.0–36.0)
MCV: 82.9 fL (ref 78.0–100.0)
Platelets: 173 10*3/uL (ref 150–400)
RBC: 3.22 MIL/uL — ABNORMAL LOW (ref 4.22–5.81)
RDW: 15.7 % — ABNORMAL HIGH (ref 11.5–15.5)
WBC: 12 10*3/uL — ABNORMAL HIGH (ref 4.0–10.5)

## 2011-09-28 LAB — BASIC METABOLIC PANEL
BUN: 48 mg/dL — ABNORMAL HIGH (ref 6–23)
CO2: 21 mEq/L (ref 19–32)
Calcium: 8 mg/dL — ABNORMAL LOW (ref 8.4–10.5)
Chloride: 108 mEq/L (ref 96–112)
Creatinine, Ser: 1.06 mg/dL (ref 0.50–1.35)
GFR calc Af Amer: 77 mL/min — ABNORMAL LOW (ref >90)
GFR calc non Af Amer: 66 mL/min — ABNORMAL LOW (ref >90)
Glucose, Bld: 94 mg/dL (ref 70–99)
Potassium: 4.5 mEq/L (ref 3.5–5.1)
Sodium: 136 mEq/L (ref 135–145)

## 2011-09-28 LAB — PROTIME-INR
INR: 1.23 (ref 0.00–1.49)
Prothrombin Time: 15.8 seconds — ABNORMAL HIGH (ref 11.6–15.2)

## 2011-09-28 LAB — CARDIAC PANEL(CRET KIN+CKTOT+MB+TROPI)
Relative Index: INVALID (ref 0.0–2.5)
Total CK: 44 U/L (ref 7–232)
Troponin I: 1.06 ng/mL (ref <0.30)

## 2011-09-28 SURGERY — EGD (ESOPHAGOGASTRODUODENOSCOPY)
Anesthesia: Moderate Sedation

## 2011-09-28 MED ORDER — HYDROCODONE-HOMATROPINE 5-1.5 MG/5ML PO SYRP
5.0000 mL | ORAL_SOLUTION | Freq: Four times a day (QID) | ORAL | Status: DC | PRN
  Administered 2011-09-28 – 2011-10-09 (×11): 5 mL via ORAL
  Filled 2011-09-28 (×11): qty 5

## 2011-09-28 MED ORDER — MIDAZOLAM HCL 10 MG/2ML IJ SOLN
INTRAMUSCULAR | Status: DC | PRN
  Administered 2011-09-28: 2 mg via INTRAVENOUS
  Administered 2011-09-28: 1 mg via INTRAVENOUS

## 2011-09-28 MED ORDER — FENTANYL CITRATE 0.05 MG/ML IJ SOLN
INTRAMUSCULAR | Status: DC | PRN
  Administered 2011-09-28: 25 ug via INTRAVENOUS

## 2011-09-28 MED ORDER — CHLORHEXIDINE GLUCONATE CLOTH 2 % EX PADS
6.0000 | MEDICATED_PAD | Freq: Every day | CUTANEOUS | Status: DC
  Administered 2011-09-28 – 2011-09-29 (×2): 6 via TOPICAL

## 2011-09-28 MED ORDER — MIDAZOLAM HCL 10 MG/2ML IJ SOLN
INTRAMUSCULAR | Status: AC
  Filled 2011-09-28: qty 2

## 2011-09-28 MED ORDER — FENTANYL CITRATE 0.05 MG/ML IJ SOLN
INTRAMUSCULAR | Status: AC
  Filled 2011-09-28: qty 2

## 2011-09-28 NOTE — Progress Notes (Signed)
[facility] DAILY PROGRESS NOTE  NAME:  [name]   MRN: [id number] DOB:  1933-07-08   ADMIT DATE: 09/26/2011   Patient Description   78 y.o. male with PMH below:  presented with GI bleed- Hgb 7.0 on admission. There is a history of melana. He also has had some SSCP and his Troponin is positive - 1.68. His pain is better with NTG. His EKG shows no acute changes. He continues to have SSCP.     Past Medical History  Diagnosis Date  . Asthma   . Hypertension   . Pulmonary embolism   . Peripheral artery disease   . Hypercholesteremia   . Bursitis of right shoulder   . DVT of leg (deep venous thrombosis)   . Stroke   . Cough   . Trouble swallowing   . Change in voice   . Difficulty urinating   . Easy bruising     Clinical Course: Admitted, transfused, Hgb with minimal bump from transfusion of 2 units yesterday. Length of Stay:  LOS: 2 days    Subjective:   Today [name] underwent EGD.  He notes feeling the "burning" sensation in his chest more frequently today.  NTG of little relief, but SL NTG leads to gradual relief over ~10in.  Objective:  Temp:  [97.5 F (36.4 C)-99.6 F (37.6 C)] 98.2 F (36.8 C) (05/27 1200) Pulse Rate:  [87-125] 112  (05/27 1208) Resp:  [12-39] 19  (05/27 1200) BP: (88-116)/(41-64) 105/57 mmHg (05/27 1208) SpO2:  [95 %-100 %] 100 % (05/27 1200) Weight change:  Physical Exam: General appearance: alert, cooperative, no distress and thin, frail Neck: no adenopathy, no carotid bruit, no JVD, supple, symmetrical, trachea midline and thyroid not enlarged, symmetric, no tenderness/mass/nodules Lungs: clear to auscultation bilaterally, normal percussion bilaterally and with mildy decrease basal BS; non-labored Heart: regular rate and rhythm, S1, S2 normal, no S3 or S4, systolic murmur: holosystolic 1/6, blowing at apex, no click and no rub Abdomen: soft, non-tender; bowel sounds normal; no masses,  no  organomegaly Extremities: extremities normal, atraumatic, no cyanosis or edema Pulses: B FA scars; diminished ~trace to 1+ pulses, feet warm Neurologic: Grossly normal for his baseline, mild expressive aphasia with L hemiparesis  Intake/Output from previous day: 05/26 0701 - 05/27 0700 In: 1996 [P.O.:480; I.V.:1154; Blood:362] Out: 2075 [Urine:2075]  Intake/Output Summary (Last 24 hours) at 09/28/11 1248 Last data filed at 09/28/11 0700  Gross per 24 hour  Intake   1436 ml  Output   1575 ml  Net   -139 ml    Results for orders placed during the hospital encounter of 09/26/11 (from the past 24 hour(s))  CARDIAC PANEL(CRET KIN+CKTOT+MB+TROPI)     Status: Abnormal   Collection Time   09/27/11  6:20 PM      Component Value Range   Total CK 75  7 - 232 (U/L)   CK, MB 5.1 (*) 0.3 - 4.0 (ng/mL)   Troponin I 1.26 (*) <0.30 (ng/mL)   Relative Index RELATIVE INDEX IS INVALID  0.0 - 2.5   CARDIAC PANEL(CRET KIN+CKTOT+MB+TROPI)     Status: Abnormal   Collection Time   09/28/11  2:57 AM      Component Value Range   Total CK 56  7 - 232 (U/L)   CK, MB 4.0  0.3 - 4.0 (ng/mL)   Troponin I 1.16 (*) <0.30 (ng/mL)   Relative Index RELATIVE INDEX IS INVALID  0.0 - 2.5   CBC  Status: Abnormal   Collection Time   09/28/11  2:57 AM      Component Value Range   WBC 12.0 (*) 4.0 - 10.5 (K/uL)   RBC 3.22 (*) 4.22 - 5.81 (MIL/uL)   Hemoglobin 9.1 (*) 13.0 - 17.0 (g/dL)   HCT 62.1 (*) 30.8 - 52.0 (%)   MCV 82.9  78.0 - 100.0 (fL)   MCH 28.3  26.0 - 34.0 (pg)   MCHC 34.1  30.0 - 36.0 (g/dL)   RDW 65.7 (*) 84.6 - 15.5 (%)   Platelets 173  150 - 400 (K/uL)  BASIC METABOLIC PANEL     Status: Abnormal   Collection Time   09/28/11  2:57 AM      Component Value Range   Sodium 136  135 - 145 (mEq/L)   Potassium 4.5  3.5 - 5.1 (mEq/L)   Chloride 108  96 - 112 (mEq/L)   CO2 21  19 - 32 (mEq/L)   Glucose, Bld 94  70 - 99 (mg/dL)   BUN 48 (*) 6 - 23 (mg/dL)   Creatinine, Ser 9.62  0.50 - 1.35  (mg/dL)   Calcium 8.0 (*) 8.4 - 10.5 (mg/dL)   GFR calc non Af Amer 66 (*) >90 (mL/min)   GFR calc Af Amer 77 (*) >90 (mL/min)  PROTIME-INR     Status: Abnormal   Collection Time   09/28/11  2:57 AM      Component Value Range   Prothrombin Time 15.8 (*) 11.6 - 15.2 (seconds)   INR 1.23  0.00 - 1.49     EGD 09/28/10: No clear source of bleeding besides distal esophagitis/gastritis   Echo:Read 09/27/11; EF 40-50%,  basal to mid anteroseptal & inferoseptal hypokinesis (as previously reported).  MAR Reviewed  Assessment/Plan:  Principal Problem:  *GI bleed, recurrent, on Coumadin- hgb 7.0 on admission Active Problems:  Chronic anticoagulation  Fracture of hip, right, surg -Feb 2013  SBO (small bowel obstruction), lysis of adhesions Feb 2013  NSTEMI (non-ST elevated myocardial infarction)  DVT (deep venous thrombosis)  CVA, Rt brain Feb 2013  History of GI bleed Feb 2013  History of renal insufficiency syndrome, 7/12 and 3/13- SCr Nl (1.0) now  78 y/o well known to [facility] with long standing h/o PAD (s/p Fem-fem bypass & repeated Iliac PTA) who suffered a PE in 11/2010, then had a hip fxr with a very complicated post ORIF hospitalization in March 2013 00> CVA (with Left Hemiparesis),GIBld/Bowel Obstruction. On Warfarin for PE & Plavix for PAD.  He was recently started on "injections" for a UTI ~1 week ago. Since then he has been noting recurrent "burning in his chest" that is made worse with PT & transferring. This "burning" is associated with shortness of breath & orthopnea. When not present, he denies any shortness of breath. No palpitations. He was found to have low Hgb c/w GI bleed & note to have recurrent chest burning with + troponin.  Cardiovascular ROS: positive for - chest pain (burning), dyspnea on exertion, orthopnea and shortness of breath  negative for - edema, irregular heartbeat, loss of consciousness, murmur, palpitations or rapid heart rate  Very difficult situation with SSx  of Angina (not sure if true ACS vs Anemia/Tachycardiac mediated supply vs. Demand ischemia in setting of GI bleed & likely existing CAD.  - confounding the substernal chest burning DDx with esophagitis / gastritis & delayed action of NTG -- could this be GI in nature & not true cardaic.  Our options our  limited from an invasive standpoint b/c GI bleed precluding anticoagulation -- this also precludes Cath with PCI as PCi would require full dose anticoagulation & antiplatelet Rx. -- Echo suggests possible LAD lesion, but appears to be similar to March. He will certainly need an ischemic evaluation, but will need to be once his GI bleed situation is stable.  He is not currently noting CHF symptoms with orthopnea only while having "angina" PLAN:   Would restart home BB - agree with lower dose to avoid hypotension; holding BB can result in rebound tachycardia.   IV NTG gtt - will titrate up  Hgb with minimal increase post transfusion - not sure if the true extent of anemia was realized.  Need to see stable H/H for at least ~48+ hrs prior to considering invasive evaluation with potential need for full anticoagulation & re-institution of Clopidogrel.   will tentatively plan for LHC ~by Thursday if Hgb level remains stable  Continue statin.   With no clear evidence of Afib & PE over 1 yr ago, I am not sure as to the urgency of re-initiating warfarin RX. He did haveBilateral DVTs, so an IVC filter may need to be entertained.  -- need to take the need for re-instituting warfarin into consideration - especially if concern for CAD +/- PCI with need for long-term Clopidogrel.   Time Spent Directly with Patient:  15 minutes   Donnica Jarnagin W, M.D., M.S. [facility] [address] [place], [place]  [address]  [phone]  09/28/2011 12:48 PM

## 2011-09-28 NOTE — Interval H&P Note (Signed)
History and Physical Interval Note:  09/28/2011 10:12 AM  [name]  has presented today for surgery, with the diagnosis of melena  The various methods of treatment have been discussed with the patient and family. After consideration of risks, benefits and other options for treatment, the patient has consented to  Procedure(s) (LRB): ESOPHAGOGASTRODUODENOSCOPY (EGD) (N/A) as a surgical intervention .  The patients' history has been reviewed, patient examined, no change in status, stable for surgery.  I have reviewed the patients' chart and labs.  Questions were answered to the patient's satisfaction.     Freddy Jaksch

## 2011-09-28 NOTE — Op Note (Signed)
[facility] [address] [place], [place]  [address]  ENDOSCOPY PROCEDURE REPORT  PATIENT:  [name], [name]  MR#:  [id number] BIRTHDATE:  [birth year], 78 yrs. old  GENDER:  male ENDOSCOPIST:  Willis Modena, MD Referred by:  Mcarthur Rossetti. Tyson Alias, M.D. PROCEDURE DATE:  09/28/2011 PROCEDURE:  EGD, diagnostic 43235 ASA CLASS:  Class III INDICATIONS:  melena, anticoagulation MEDICATIONS:   Cetacaine spray x 2, Fentanyl 25 mcg IV, Versed 3 mg IV  DESCRIPTION OF PROCEDURE:   After the risks benefits and alternatives of the procedure were thoroughly explained, informed consent was obtained.  The Pentax Gastroscope [id number] endoscope was introduced through the mouth and advanced to the second portion of the duodenum, without limitations.  The instrument was slowly withdrawn as the mucosa was fully examined. <<PROCEDUREIMAGES>>  FINDINGS:  Distal erosive esophagitis (LA-B); esophagus otherwise normal, without varices or Mallory-Weiss tear.  Small hiatal hernia.  Mild diffuse gastritis.  Few pre-pyloric superficial antral erosions.  Mild-to-moderate bulbar duodenitis.  One small non-bleeding D2 AVM.  No old or fresh blood seen.  ENDOSCOPIC IMPRESSION:    1.  Esophagitis and duodenitis and small AVM.  These could have led to some melena and             anemia; no lesions suggestive of high risk for rebleeding witnessed.  RECOMMENDATIONS:      1.  Protonix drip for another 48 hours. 2.  OK to resume anticoagulation as clinically warranted. 3.  If bleeding recurs, would repeat colonoscopy  (last done August 2011). 4.  Will follow.  REPEAT EXAM:  No  ______________________________ Willis Modena  CC:  n. eSIGNEDWillis Modena at 09/28/2011 10:42 AM  [name], [phone]

## 2011-09-28 NOTE — H&P (Signed)
Name: [name] MRN: [id number] DOB: 1934/01/11    LOS: 2  PULMONARY / CRITICAL CARE MEDICINE  HPI:  78 y/o M former smoker with extensive PMHx including stroke, R hip fracture, Hx of PE on anticoag with Warfarin, A fib, DVT and peripheral vascular disease who was brought to [facility] from [facility] due to burning chest pain and melanotic stools.  As per patient and medical records, pt has been undergoing rehab due to stroke and R hip fracture s/p fixation, and he has been on chronic anticoag with coumadin and ASA and plavix for PVD and stroke. He reports episodes of dark stools on 05/25 and was found to have a HR of 150's in the ER and Hb 9.7. Further workup showed troponins of 1.68, INR 2.59. Pt was given ASA and Nitroglycerin and the chest pain improved. Critical care consultation was requested for further management.   Current Status: Had EGD AM 5/27>>distal erosive esophagitis, small HH, few pre-pyloric superficial antral erosions, bulbar duodenitis, one small non-bleeding AVM D2.  C/o cough.  Vital Signs: Temp:  [97.5 F (36.4 C)-99.6 F (37.6 C)] 97.7 F (36.5 C) (05/27 0822) Pulse Rate:  [87-125] 119  (05/27 0822) Resp:  [12-35] 23  (05/27 0830) BP: (88-120)/(48-82) 102/59 mmHg (05/27 0830) SpO2:  [96 %-100 %] 100 % (05/27 0830)  Physical Examination: General:  No distress Neuro:  Alert, follows commands HEENT:  No JVD Cardiovascular:  Tachycardic rhythm. S1 and S2 normal Lungs:  Mildly decreased breath sounds at the pulm bases, no crackles or wheezes. Abdomen:  Soft, non tender, non distended. BS+, no peritoneal signs Skin:  No rash    ASSESSMENT AND PLAN  PULMONARY    A: Hx of PE/DVT   Hx of asthma - V/Q scan from 11/23/10 positive for PE and Hx of DVT (doppler 07/11/11>>chronic DVT b/l femoral veins) P: - Pt with GI bleed>>will need to d/w GI if/when he can resume anti-coagulation  - No active wheezing at this time. -  Bronchodilators  CARDIOVASCULAR  Lab 09/28/11 0257 09/27/11 1820 09/26/11 2111  TROPONINI 1.16* 1.26* --  LATICACIDVEN -- -- 1.6  PROBNP -- -- --   ECG:  Old Q waves, no ST changes Lines: peripheral access x2  Echo 5/26>>EF 40 to 50%  A: Acute coronary syndrome Likely demand ischemia due to anemia due to GI bleed P:  -Pt is not a candidate for anticoagulation due to GI bleed -continue lopressor - cardiology following  GASTROINTESTINAL  Lab 09/26/11 2001  AST 32  ALT 21  ALKPHOS 64  BILITOT 0.1*  PROT 7.0  ALBUMIN 3.0*    A:  GI bleed - Pt with Hx of upper GI bleed s/p EGD on 07/03/11 by Dr Bosie Clos With Duodenal bulb benign-appearing lesion  -s/p biopsy. Suspect pancreatic rest (ectopic pancreatic tissue) and no source of bleeding seen  P:  - Protonix drip until 5/29 per GI  HEMATOLOGIC  Lab 09/28/11 0257 09/27/11 0845 09/27/11 0136 09/26/11 2001  HGB 9.1* 8.6* 7.0* 9.7*  HCT 26.7* 26.3* 21.7* 31.3*  PLT 173 201 336 416*  INR 1.23 -- -- 2.59*  APTT -- -- -- --   A:  Anemia due to GI bleeding P: - Follow CBC -transfuse to keep Hb > 8 due to ACS   NEUROLOGIC  A:  Hx of stroke P:  On ASA, plavix at home and coumadin for PE/DVT - Hold ASA/Plavix due to active GI bleed   BEST PRACTICE / DISPOSITION - Level  of Care:  ICU - Primary Service:  PCCM - Consultants:  GI, cardiology - Code Status:  Full code - Diet:  Clear liquids - DVT Px:  SCDs - GI Px:  Protonix  Coralyn Helling, MD [facility] Pulmonary/Critical Care 09/28/2011, 8:47 AM Pager:  [phone] After 3pm call: [phone]

## 2011-09-29 ENCOUNTER — Encounter (HOSPITAL_COMMUNITY): Payer: Self-pay | Admitting: Gastroenterology

## 2011-09-29 LAB — CARDIAC PANEL(CRET KIN+CKTOT+MB+TROPI)
Relative Index: INVALID (ref 0.0–2.5)
Total CK: 38 U/L (ref 7–232)
Troponin I: 0.72 ng/mL (ref <0.30)

## 2011-09-29 LAB — BASIC METABOLIC PANEL
CO2: 23 mEq/L (ref 19–32)
Calcium: 8.1 mg/dL — ABNORMAL LOW (ref 8.4–10.5)
GFR calc Af Amer: 77 mL/min — ABNORMAL LOW (ref >90)
GFR calc non Af Amer: 66 mL/min — ABNORMAL LOW (ref >90)
Sodium: 136 mEq/L (ref 135–145)

## 2011-09-29 LAB — CBC
Platelets: 179 10*3/uL (ref 150–400)
RBC: 2.81 MIL/uL — ABNORMAL LOW (ref 4.22–5.81)
WBC: 9.6 10*3/uL (ref 4.0–10.5)

## 2011-09-29 LAB — PREPARE RBC (CROSSMATCH)

## 2011-09-29 LAB — HEMOGLOBIN AND HEMATOCRIT, BLOOD
HCT: 27.2 % — ABNORMAL LOW (ref 39.0–52.0)
Hemoglobin: 9.3 g/dL — ABNORMAL LOW (ref 13.0–17.0)

## 2011-09-29 MED ORDER — CHLORHEXIDINE GLUCONATE 0.12 % MT SOLN: 15.0000 mL | Freq: Two times a day (BID) | OROMUCOSAL | Status: DC

## 2011-09-29 MED ORDER — BOOST PLUS PO LIQD
237.0000 mL | Freq: Two times a day (BID) | ORAL | Status: DC
  Administered 2011-09-29 – 2011-10-12 (×21): 237 mL via ORAL
  Filled 2011-09-29 (×32): qty 237

## 2011-09-29 MED ORDER — METOPROLOL TARTRATE 1 MG/ML IV SOLN
2.5000 mg | Freq: Once | INTRAVENOUS | Status: DC
  Filled 2011-09-29: qty 5

## 2011-09-29 MED ORDER — METOPROLOL TARTRATE 1 MG/ML IV SOLN: 5.0000 mg | Freq: Once | INTRAVENOUS | Status: DC

## 2011-09-29 MED ORDER — BIOTENE DRY MOUTH MT LIQD
15.0000 mL | Freq: Two times a day (BID) | OROMUCOSAL | Status: DC
  Administered 2011-09-29 – 2011-10-02 (×7): 15 mL via OROMUCOSAL

## 2011-09-29 MED ORDER — PANTOPRAZOLE SODIUM 40 MG IV SOLR
40.0000 mg | Freq: Two times a day (BID) | INTRAVENOUS | Status: DC
  Administered 2011-09-29 – 2011-09-30 (×4): 40 mg via INTRAVENOUS
  Filled 2011-09-29 (×6): qty 40

## 2011-09-29 MED ORDER — NITROGLYCERIN IN D5W 200-5 MCG/ML-% IV SOLN
5.0000 ug/min | INTRAVENOUS | Status: AC
  Administered 2011-09-29: 45 ug/min via INTRAVENOUS
  Administered 2011-10-01: 15 ug/min via INTRAVENOUS
  Filled 2011-09-29 (×2): qty 250

## 2011-09-29 NOTE — Progress Notes (Signed)
[facility] PROGRESS NOTE Subjective No gross bleeding but Hg slowly dropping.  No BM since yesterday per staff.  Denies pain is hungry.  Objective: Vital signs in last 24 hours: Temp:  [97.3 F (36.3 C)-98.6 F (37 C)] 97.3 F (36.3 C) (05/28 0800) Pulse Rate:  [84-116] 98  (05/28 0800) Resp:  [14-39] 17  (05/28 0800) BP: (90-109)/(41-67) 100/54 mmHg (05/28 0724) SpO2:  [95 %-100 %] 100 % (05/28 0800) Weight:  [60.7 kg (133 lb 13.1 oz)] 60.7 kg (133 lb 13.1 oz) (05/28 0000) Last BM Date: 09/26/11  Intake/Output from previous day: 05/27 0701 - 05/28 0700 In: 1418.3 [P.O.:240; I.V.:1178.3] Out: 1565 [Urine:1565] Intake/Output this shift: Total I/O In: 99 [I.V.:99] Out: 125 [Urine:125]  AV:WUJWJ and oriented Abd-soft and nontender  Lab Results:  Basename 09/29/11 0340 09/28/11 0257 09/27/11 0845 09/27/11 0136 09/26/11 2001  WBC 9.6 12.0* 11.4* 11.9* 13.3*  HGB 7.8* 9.1* 8.6* 7.0* 9.7*  HCT 23.8* 26.7* 26.3* 21.7* 31.3*  PLT 179 173 201 336 416*   BMET  Basename 09/29/11 0340 09/28/11 0257 09/27/11 0136 09/26/11 2001  NA 136 136 137 135  K 3.9 4.5 4.6 4.8  CL 106 108 108 100  CO2 23 21 21 26   CREATININE 1.06 1.06 1.00 1.00   LFT  Basename 09/26/11 2001  PROT 7.0  AST 32  ALT 21  ALKPHOS 64  BILITOT 0.1*  BILIDIR --  IBILI --   PT/INR  Basename 09/28/11 0257 09/26/11 2001  LABPROT 15.8* 28.2*  INR 1.23 2.59*   PANCREAS No results found for this basename: LIPASE:3 in the last 72 hours       Studies/Results: No results found.  Medications: I have reviewed the patient's current medications.  Assessment/Plan: 1. GI bleed.  Esophagitis and duodenitis on EGD. Will give FL diet. Cardiac status apparently still somewhat unclear. Will follow. Hopefully, can avoid  Colon since some ? Of MI.   Daden Mahany JR,Oda Lansdowne L 09/29/2011, 9:26 AM

## 2011-09-29 NOTE — Progress Notes (Signed)
Name: [name] MRN: [id number] DOB: 23-Nov-1933    LOS: 3  PULMONARY / CRITICAL CARE MEDICINE  HPI:  78 y/o M former smoker with extensive PMHx including stroke, R hip fracture, Hx of PE on anticoag with Warfarin, A fib, DVT and peripheral vascular disease who was brought to [facility] from [facility] due to burning chest pain and melanotic stools.  Current Status: Had EGD AM 5/27>>distal erosive esophagitis, small HH, few pre-pyloric superficial antral erosions, bulbar duodenitis, one small non-bleeding AVM D2.  C/o cough.  Vital Signs: Temp:  [97.3 F (36.3 C)-98.6 F (37 C)] 97.3 F (36.3 C) (05/28 0800) Pulse Rate:  [84-116] 103  (05/28 0951) Resp:  [14-23] 17  (05/28 0800) BP: (92-109)/(48-67) 100/50 mmHg (05/28 0951) SpO2:  [98 %-100 %] 100 % (05/28 0800) Weight:  [60.7 kg (133 lb 13.1 oz)] 60.7 kg (133 lb 13.1 oz) (05/28 0000)  Physical Examination: General:  No distress Neuro:  Alert, follows commands HEENT:  No JVD Cardiovascular:  Tachycardic rhythm. S1 and S2 normal Lungs:  Mildly decreased breath sounds at the pulm bases, no crackles or wheezes. Abdomen:  Soft, non tender, non distended. BS+, no peritoneal signs Skin:  No rash    ASSESSMENT AND PLAN  PULMONARY  A: Hx of PE/DVT   Hx of asthma - V/Q scan from 11/23/10 positive for PE and Hx of DVT (doppler 07/11/11>>chronic DVT b/l femoral veins) P:  - Pt with GI bleed>>will need to d/w GI if/when he can resume anti-coagulation - Bronchodilators  CARDIOVASCULAR  Lab 09/28/11 1317 09/28/11 0257 09/27/11 1820 09/26/11 2111  TROPONINI 1.06* 1.16* 1.26* --  LATICACIDVEN -- -- -- 1.6  PROBNP -- -- -- --   ECG:  Old Q waves, no ST changes Lines: peripheral access x2  Echo 5/26>>EF 40 to 50% basal to mid anteroseptal & inferoseptal hypokinesis   A: Acute coronary syndrome/ NSTEMI: Likely demand ischemia due to anemia in setting of acute GI bleed P:  - Pt is not a candidate for anticoagulation  currently  due to GI bleed - continue lopressor, low dose  - cardiology following - ntg gtt, try to wean off.  - cards wants stable h&h for at least 48 hrs before considering intervention  A: AFIB P: rate control  GASTROINTESTINAL  Lab 09/26/11 2001  AST 32  ALT 21  ALKPHOS 64  BILITOT 0.1*  PROT 7.0  ALBUMIN 3.0*    A: GI bleed - Pt with Hx of upper GI bleed s/p EGD on 07/03/11 by Dr Bosie Clos With Duodenal bulb benign-appearing lesion  -s/p biopsy. Suspect pancreatic rest (ectopic pancreatic tissue) and no source of bleeding seen P:  -cont ppi-->change to BID  HEMATOLOGIC  Lab 09/29/11 0340 09/28/11 0257 09/27/11 0845 09/27/11 0136 09/26/11 2001  HGB 7.8* 9.1* 8.6* 7.0* 9.7*  HCT 23.8* 26.7* 26.3* 21.7* 31.3*  PLT 179 173 201 336 416*  INR -- 1.23 -- -- 2.59*  APTT -- -- -- -- --   A:  Anemia due to GI bleeding P: - Follow CBC -transfuse to keep Hb > 8 due to ACS   NEUROLOGIC  A:  Hx of stroke P:  On ASA, plavix at home and coumadin for PE/DVT - Holding  ASA/Plavix due to active GI bleed -will need PT/OT if no CP off NTG  BEST PRACTICE / DISPOSITION - Level of Care:  sdu - Primary Service:  Ask triad to assume care.  - Consultants:  GI, cardiology - Code Status:  Full code - Diet:  Clear liquids - DVT Px:  SCDs - GI Px:  Protonix    Anders Simmonds, ACNP   Seen on CCM rounds this morning with resident MD or ACNP above.  Pt examined and database reviewed. I agree with above findings, assessment and plan as reflected in the note above.   Billy Fischer, MD;  PCCM service; [place] [phone]

## 2011-09-29 NOTE — Discharge Summary (Signed)
Critical Care Sign Off  Summary     Patient ID: [name] MRN: [id number] DOB/AGE: October 09, 1933 78 y.o.  Admit date: 09/26/2011 Discharge date: 09/29/2011  Admission Diagnoses: Acute UGI bleed.  Discharge Diagnoses:  Principal Problem:  *GI bleed, recurrent, on Coumadin- hgb 7.0 on admission Active Problems:  Chronic anticoagulation  Fracture of hip, right, surg -Feb 2013  SBO (small bowel obstruction), lysis of adhesions Feb 2013  NSTEMI (non-ST elevated myocardial infarction)  DVT (deep venous thrombosis)  CVA, Rt brain Feb 2013  History of GI bleed Feb 2013  History of renal insufficiency syndrome, 7/12 and 3/13- SCr Nl (1.0) now   Significant Hospital tests/ studies/ interventions and procedures   Echo 5/26>>EF 40 to 50% basal to mid anteroseptal & inferoseptal hypokinesis  ECG: Old Q waves, no ST changes  Hospital Course:  Hx of PE/DVT  And asthma  - V/Q scan from 11/23/10 positive for PE and Hx of DVT (doppler 07/11/11>>chronic DVT b/l femoral veins). No distress.  P:  - Pt with GI bleed>>will need to d/w GI if/when he can resume anti-coagulation  - Bronchodilators  - will place IVC filter as had UGIB 3 months ago as well.   Acute coronary syndrome/ NSTEMI: Likely demand ischemia due to anemia in setting of acute GI bleed. Presented w/ chest burning that responded to NTG gtt and + cardiac enzymes. Cards following.    Lab  09/28/11 1317  09/28/11 0257  09/27/11 1820  09/26/11 2111   TROPONINI  1.06*  1.16*  1.26*  --   LATICACIDVEN  --  --  --  1.6   PROBNP  --  --  --  --    Plan - Pt is not a candidate for anticoagulation currently due to GI bleed  - continue lopressor, low dose  - cardiology following  - ntg gtt, try to wean off.  - cards wants stable h&h for at least 48 hrs before considering intervention   AFIB  P: rate control   GI bleed  - Pt with Hx of upper GI bleed s/p EGD on 07/03/11 by Dr Bosie Clos  With Duodenal bulb benign-appearing lesion.    Had EGD AM 5/27>>distal erosive esophagitis, small HH, few pre-pyloric superficial antral erosions, bulbar duodenitis, one small non-bleeding AVM  P:  -cont ppi-->change to BID  -advance diet to full  Anemia due to GI bleeding   Lab  09/29/11 0340  09/28/11 0257  09/27/11 0845  09/27/11 0136  09/26/11 2001   HGB  7.8*  9.1*  8.6*  7.0*  9.7*   HCT  23.8*  26.7*  26.3*  21.7*  31.3*   PLT  179  173  201  336  416*   INR  --  1.23  --  --  2.59*   APTT  --  --  --  --  --    P:  - Follow CBC  -transfuse to keep Hb > 8 due to ACS   Hx of stroke  P: On ASA, plavix at home and coumadin for PE/DVT  - Holding ASA/Plavix due to active GI bleed  -will need PT/OT if no CP off NTG  Discharge Exam: BP 100/50  Pulse 103  Temp(Src) 97.8 F (36.6 C) (Oral)  Resp 17  Ht 5\' 7"  (1.702 m)  Wt 60.7 kg (133 lb 13.1 oz)  BMI 20.96 kg/m2  SpO2 100%   Physical Examination:  General: No distress  Neuro: Alert, follows commands  HEENT: No JVD  Cardiovascular: Tachycardic rhythm. S1 and S2 normal  Lungs: Mildly decreased breath sounds at the pulm bases, no crackles or wheezes.  Abdomen: Soft, non tender, non distended. BS+, no peritoneal signs  Skin: No rash   Labs at discharge Lab Results  Component Value Date   CREATININE 1.06 09/29/2011   BUN 30* 09/29/2011   NA 136 09/29/2011   K 3.9 09/29/2011   CL 106 09/29/2011   CO2 23 09/29/2011   Lab Results  Component Value Date   WBC 9.6 09/29/2011   HGB 7.8* 09/29/2011   HCT 23.8* 09/29/2011   MCV 84.7 09/29/2011   PLT 179 09/29/2011   Lab Results  Component Value Date   ALT 21 09/26/2011   AST 32 09/26/2011   ALKPHOS 64 09/26/2011   BILITOT 0.1* 09/26/2011   Lab Results  Component Value Date   INR 1.23 09/28/2011   INR 2.59* 09/26/2011   INR 3.02* 08/04/2011    Current radiology studies No results found.  Disposition: SDU  Scheduled Meds:   . antiseptic oral rinse  15 mL Mouth Rinse q12n4p  . metoprolol tartrate  50 mg Oral  BID  . mupirocin ointment  1 application Nasal BID  . pantoprazole (PROTONIX) IV  40 mg Intravenous Q12H  . simvastatin  10 mg Oral q1800  . DISCONTD: chlorhexidine  15 mL Mouth Rinse BID  . DISCONTD: Chlorhexidine Gluconate Cloth  6 each Topical Q0600   Continuous Infusions:   . nitroGLYCERIN    . DISCONTD: nitroGLYCERIN 15 mcg/min (09/28/11 1330)  . DISCONTD: pantoprozole (PROTONIX) infusion 8 mg/hr (09/29/11 0941)   PRN Meds:.sodium chloride, HYDROcodone-homatropine, morphine injection, nitroGLYCERIN, ondansetron (ZOFRAN) IV     Discharged Condition: fair  Disposition:  78 year old male w/ PMH Afib, CVA, and PE/DVTs. Presented 5/25 with UGIB complicated by NSTEMI. Currently we will transition him to SDU while we wean NTG off. He is getting one more unit of blood. Cardiology thinks he will need intervention. They would like to have hgb stay stable X 48 hr.   Physician Statement:   Seen on CCM rounds this morning with resident MD or ACNP above.  Pt examined and database reviewed. I agree with above findings, assessment and plan as reflected in the note above.   Billy Fischer, MD;  PCCM service; Mobile [phone]   Signed: Anders Simmonds 09/29/2011, 12:09 PM

## 2011-09-29 NOTE — Progress Notes (Signed)
Subjective: now with chest burning similar to admit.   NTG being titrated up.  Objective: Vital signs in last 24 hours: Temp:  [97.3 F (36.3 C)-98.6 F (37 C)] 97.9 F (36.6 C) (05/28 1322) Pulse Rate:  [84-110] 110  (05/28 1322) Resp:  [14-26] 26  (05/28 1322) BP: (92-129)/(48-77) 129/77 mmHg (05/28 1322) SpO2:  [98 %-100 %] 100 % (05/28 1316) Weight:  [60.7 kg (133 lb 13.1 oz)] 60.7 kg (133 lb 13.1 oz) (05/28 0000) Weight change:  Last BM Date: 09/26/11 Intake/Output from previous day: -148 05/27 0701 - 05/28 0700 In: 1418.3 [P.O.:240; I.V.:1178.3] Out: 1565 [Urine:1565] Intake/Output this shift: Total I/O In: 448 [I.V.:448] Out: 125 [Urine:125]  PE: General:alert and oriented, NTG does help the burning.  EKG with increased ST depression in lat. Leads. Heart:S1S2 RRR STach HR 111. Lungs:diminshed throughout Abd:+ BS soft, non tender. Ext:no edema. Neuro:alert and oriented. Follows commands.   Lab Results:  Basename 09/29/11 0340 09/28/11 0257  WBC 9.6 12.0*  HGB 7.8* 9.1*  HCT 23.8* 26.7*  PLT 179 173   BMET  Basename 09/29/11 0340 09/28/11 0257  NA 136 136  K 3.9 4.5  CL 106 108  CO2 23 21  GLUCOSE 97 94  BUN 30* 48*  CREATININE 1.06 1.06  CALCIUM 8.1* 8.0*    Basename 09/28/11 1317 09/28/11 0257  TROPONINI 1.06* 1.16*    Lab Results  Component Value Date   CHOL 91 07/27/2011   HDL 14* 07/14/2011   LDLCALC 28 07/14/2011   TRIG 110 07/27/2011   CHOLHDL 3.9 07/14/2011   Lab Results  Component Value Date   HGBA1C 6.4* 07/13/2011     Lab Results  Component Value Date   TSH 1.059 08/04/2011    Hepatic Function Panel  Basename 09/26/11 2001  PROT 7.0  ALBUMIN 3.0*  AST 32  ALT 21  ALKPHOS 64  BILITOT 0.1*  BILIDIR --  IBILI --   No results found for this basename: CHOL in the last 72 hours No results found for this basename: PROTIME in the last 72 hours    EKG: Orders placed during the hospital encounter of 09/26/11  . EKG 12-LEAD  .  EKG 12-LEAD  . EKG 12-LEAD  . EKG 12-LEAD  . EKG 12-LEAD  . EKG 12-LEAD  . EKG    Studies/Results: No results found.  Medications: I have reviewed the patient's current medications.    Marland Kitchen antiseptic oral rinse  15 mL Mouth Rinse q12n4p  . lactose free nutrition  237 mL Oral BID BM  . metoprolol tartrate  50 mg Oral BID  . mupirocin ointment  1 application Nasal BID  . pantoprazole (PROTONIX) IV  40 mg Intravenous Q12H  . simvastatin  10 mg Oral q1800  . DISCONTD: chlorhexidine  15 mL Mouth Rinse BID  . DISCONTD: Chlorhexidine Gluconate Cloth  6 each Topical Q0600   Assessment/Plan: Patient Active Problem List  Diagnoses  . PE (pulmonary embolism) July 2012  . Chronic anticoagulation  . Fall from standing  . Fracture of hip, right, surg -Feb 2013  . PVD, L-R FFBPG '01, RCIA PTA 3/09, REIA PTA 8/11  . SBO (small bowel obstruction), lysis of adhesions Feb 2013  . Fecal impaction in rectum  . Chronic constipation  . Acute respiratory failure with hypoxia  . Tachycardia  . Brunner's gland hyperplasia of duodenum  . Dysphagia S/P CVA (cerebrovascular accident)  . GI bleed, recurrent, on Coumadin- hgb 7.0 on admission  . NSTEMI (non-ST  elevated myocardial infarction)  . DVT (deep venous thrombosis)  . 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 History of GI bleed Feb 2013  . History of renal insufficiency syndrome, 7/12 and 3/13- SCr Nl (1.0) now   PLAN: H/H has dropped from yesterday. With NSTEMI most likely due to demand ischemia would like hgb>8.5. Being transfused now. Now with recurrent angina, unstable.  Will add IV lopressor X 1 to help with pain, BP  and pulse.OK to give Morphine.   Have discussed with Dr. Herbie Baltimore, treating medically.   Currently holding asa, plavix, coumadin.  See CCM note concerning IVC filter.  Plan for cardiac cath by Thursday depending on Pt's stability.  LOS: 3 days   INGOLD,LAURA R 09/29/2011, 1:32 PM  ATTENDING ATTESTATION:  I have seen and examined the  patient this PM after being seen this AM by Nada Boozer, NP.  I have reviewed the chart, notes and new data.  I agree with Luara's note.  We discussed the plan of action for this AM with BB dose increased, IV dose & increasing NTG gtt.  Brief Description: 78 y/o man with known PAD, recent CVA, PE in 11/2010 & Bilateral DVTs 3/13 - admitted now with GIBld - requiring yet another transfusion this AM of 2 additional units (6 units to date)  I suspect with the minimal bump from the preceding transfusion, that we underestimated the extent of ischemia.  He has had another drop in Hgb this AM (with no clear upper GI source beyond esophagitis / gastritis) with recurrence of Angina.  We are very much limited to medical therapy for his CP that may be Angina (possibly supply vs. Demand ischemia) with low level Troponin elevations.    PLAN: Try to keep Hgb > 9  Rate control with BB  IV NTG gtt for reduced pre-load & coronary vasodilation.  Agree with IVC filter (may need IJ insertion with LE DVTs to prevent recurrent PE.  Echo with what looks like existing WMA - that does suggest exsiting CAD (not unexpected with known PAD)  If bleeding has stabilized by the end of the week, we can consider LCP, but will have to make day to day decision.  Marykay Lex, M.D., M.S. [facility] [address]. [address] [place], [place]  [address]  [phone]  09/29/2011 6:36 PM

## 2011-09-29 NOTE — Progress Notes (Signed)
CRITICAL VALUE ALERT  Critical value received:  Trop 0.72  Date of notification:  09/29/2011  Time of notification:  7:10 PM   Critical value read back:yes  Nurse who received alert:  Everette Rank, RN  MD notified (1st page):  Dr. Delton Coombes  Time of first page:  7:11 PM   MD notified (2nd page):  Time of second page:  Responding MD:  Dr. Delton Coombes  Time MD responded:  7:11 PM

## 2011-09-29 NOTE — Clinical Social Work Psychosocial (Signed)
Clinical Social Work Department BRIEF PSYCHOSOCIAL ASSESSMENT 09/29/2011  Patient:  [name], [name]     Account Number:  [id number]     Admit date:  09/26/2011  Clinical Social Worker:  Vennie Homans, Theresia Majors  Date/Time:  09/29/2011 12:00 M  Referred by:  CSW  Date Referred:  09/29/2011 Referred for  SNF Placement   Other Referral:   Interview type:  Patient Other interview type:   CHART REVIEW    PSYCHOSOCIAL DATA Living Status:  FACILITY Admitted from facility:  [facility] Level of care:  Skilled Nursing Facility Primary support name:  BESSIE Slayden Primary support relationship to patient:  SPOUSE Degree of support available:   GOOD FROM WIFE AND CHILDREN    CURRENT CONCERNS Current Concerns  Post-Acute Placement   Other Concerns:    SOCIAL WORK ASSESSMENT / PLAN CSW MET WITH PT TO ASSESS NEEDS DUE TO ADMITTED FROM SNF. PT WOULD LIKE TO RETURN TO [facility] SNF AT D/C. CSW LEFT VOICEMAIL FOR [facility] ADMISSION REP, JILL TO DISCUSS PLANS FOR RETURN. PT VERY PLEASED WITH THE REHAB PART OF [facility] AND IS EAGER TO COMPLETE HIS REHAB.   Assessment/plan status:  Psychosocial Support/Ongoing Assessment of Needs Other assessment/ plan:   ASSIST WITH RETURN TO SNF   Information/referral to community resources:    PATIENT'S/FAMILY'S RESPONSE TO PLAN OF CARE: PT VERY APPRECIATIVE AND THANKFUL. CSW WILL UPDATE FL2. HE WILL NEED PT/OT EVAL TO ASSIST WITH RETURN TO SNF. CSW WILL FOLLOW FOR RETURN TO SNF.    Vennie Homans, [place] 09/29/2011 10:01 AM [phone]

## 2011-09-29 NOTE — Progress Notes (Signed)
INITIAL ADULT NUTRITION ASSESSMENT Date: 09/29/2011   Time: 11:56 AM Reason for Assessment: Nutrition Risk for dysphagia  ASSESSMENT: Male 78 y.o.  Dx: GI bleed  Hx:  Past Medical History  Diagnosis Date  . Asthma   . Hypertension   . Pulmonary embolism   . Peripheral artery disease   . Hypercholesteremia   . Bursitis of right shoulder   . DVT of leg (deep venous thrombosis)   . Stroke   . Cough   . Trouble swallowing   . Change in voice   . Difficulty urinating   . Easy bruising     Related Meds:  Scheduled Meds:   . antiseptic oral rinse  15 mL Mouth Rinse q12n4p  . chlorhexidine  15 mL Mouth Rinse BID  . metoprolol tartrate  50 mg Oral BID  . mupirocin ointment  1 application Nasal BID  . simvastatin  10 mg Oral q1800  . DISCONTD: Chlorhexidine Gluconate Cloth  6 each Topical Q0600   Continuous Infusions:   . nitroGLYCERIN 15 mcg/min (09/28/11 1330)  . pantoprozole (PROTONIX) infusion 8 mg/hr (09/29/11 0941)   PRN Meds:.sodium chloride, HYDROcodone-homatropine, morphine injection, nitroGLYCERIN, ondansetron (ZOFRAN) IV   Ht: 5\' 7"  (170.2 cm)  Wt: 133 lb 13.1 oz (60.7 kg)  Ideal Wt: 67.27 kg % Ideal Wt: 89.8% Wt Readings from Last 10 Encounters:  09/29/11 133 lb 13.1 oz (60.7 kg)  09/29/11 133 lb 13.1 oz (60.7 kg)  08/21/11 140 lb (63.504 kg)  07/24/11 156 lb 12 oz (71.1 kg)  07/24/11 156 lb 12 oz (71.1 kg)  07/24/11 156 lb 12 oz (71.1 kg)  07/24/11 156 lb 12 oz (71.1 kg)  *unintentional 23 lb weight loss, 14.7% from baseline over 2 months.  Body mass index is 20.96 kg/(m^2). (WNL)  Food/Nutrition Related Hx: Patient reported appetite has been ok. He stated he has not taken any PO because he dislikes anything provided on the clear liquid diet. Patient appears thin. He denies any problems with chewing or swallowing. He stated he drinks 2 boost nutrition supplements at home. Discussed patient in rounds. Patient's diet has just been advanced to full  liquid diet.   Labs:  CMP     Component Value Date/Time   NA 136 09/29/2011 0340   K 3.9 09/29/2011 0340   CL 106 09/29/2011 0340   CO2 23 09/29/2011 0340   GLUCOSE 97 09/29/2011 0340   BUN 30* 09/29/2011 0340   CREATININE 1.06 09/29/2011 0340   CALCIUM 8.1* 09/29/2011 0340   PROT 7.0 09/26/2011 2001   ALBUMIN 3.0* 09/26/2011 2001   AST 32 09/26/2011 2001   ALT 21 09/26/2011 2001   ALKPHOS 64 09/26/2011 2001   BILITOT 0.1* 09/26/2011 2001   GFRNONAA 66* 09/29/2011 0340   GFRAA 77* 09/29/2011 0340    Intake/Output Summary (Last 24 hours) at 09/29/11 1200 Last data filed at 09/29/11 1100  Gross per 24 hour  Intake 1256.25 ml  Output   1690 ml  Net -433.75 ml     Diet Order: Full Liquid  Supplements/Tube Feeding: None at this time  IVF:    nitroGLYCERIN Last Rate: 15 mcg/min (09/28/11 1330)  pantoprozole (PROTONIX) infusion Last Rate: 8 mg/hr (09/29/11 0941)    Estimated Nutritional Needs:   Kcal: 4098-1191 Protein: 72.5-90.6 grams Fluid: 1 ml per kcal intake  NUTRITION DIAGNOSIS: -Inadequate oral intake (NI-2.1).  Status: Ongoing  RELATED TO: limited ability to eat and GI bleed   AS EVIDENCE BY: clear liquid diet restriction,  unintentional weight loss of 23 lb over past 2 months and pt reports poor Po intake  Since admission.  MONITORING/EVALUATION(Goals): Diet advancement/ tolerance, weight trends, po intake, labs 1. Positive tolerance of full liquid diet. 2. Diet advancement as medically able. 3. PO intake > 75% at meals and supplements. 4. Minimize weight loss.   EDUCATION NEEDS: -No education needs identified at this time  INTERVENTION: 1. Will order patient Boost nutrition supplement BID. Provides 720 kcal and 28 grams of protein daily.  2. RD to follow for nutrition plan of care.   Dietitian #: [phone]  DOCUMENTATION CODES Per approved criteria  -Severe malnutrition in the context of acute illness or injury  * Patient meets malnutrition criteria due to  unintentional weight loss of 14.7% over 2 months from baseline and apparent moderate loss of both muscle mass and body fat.   Iven Finn [facility] 09/29/2011, 11:56 AM

## 2011-09-30 DIAGNOSIS — D649 Anemia, unspecified: Secondary | ICD-10-CM | POA: Diagnosis present

## 2011-09-30 DIAGNOSIS — I255 Ischemic cardiomyopathy: Secondary | ICD-10-CM | POA: Diagnosis present

## 2011-09-30 DIAGNOSIS — I200 Unstable angina: Secondary | ICD-10-CM | POA: Diagnosis present

## 2011-09-30 LAB — TYPE AND SCREEN
ABO/RH(D): A POS
Antibody Screen: NEGATIVE
Unit division: 0
Unit division: 0
Unit division: 0

## 2011-09-30 LAB — CARDIAC PANEL(CRET KIN+CKTOT+MB+TROPI)
CK, MB: 2.2 ng/mL (ref 0.3–4.0)
Relative Index: INVALID (ref 0.0–2.5)
Relative Index: INVALID (ref 0.0–2.5)
Troponin I: 0.37 ng/mL (ref <0.30)

## 2011-09-30 LAB — BASIC METABOLIC PANEL
BUN: 19 mg/dL (ref 6–23)
CO2: 23 mEq/L (ref 19–32)
GFR calc non Af Amer: 68 mL/min — ABNORMAL LOW (ref >90)
Glucose, Bld: 104 mg/dL — ABNORMAL HIGH (ref 70–99)
Potassium: 3.8 mEq/L (ref 3.5–5.1)
Sodium: 137 mEq/L (ref 135–145)

## 2011-09-30 LAB — CBC
HCT: 26.3 % — ABNORMAL LOW (ref 39.0–52.0)
Hemoglobin: 9.1 g/dL — ABNORMAL LOW (ref 13.0–17.0)
MCHC: 34.6 g/dL (ref 30.0–36.0)
RBC: 3.1 MIL/uL — ABNORMAL LOW (ref 4.22–5.81)

## 2011-09-30 MED ORDER — CHLORHEXIDINE GLUCONATE 0.12 % MT SOLN
15.0000 mL | Freq: Two times a day (BID) | OROMUCOSAL | Status: DC
  Administered 2011-09-30 – 2011-10-02 (×4): 15 mL via OROMUCOSAL
  Filled 2011-09-30 (×4): qty 15

## 2011-09-30 MED ORDER — METOPROLOL TARTRATE 1 MG/ML IV SOLN
5.0000 mg | Freq: Once | INTRAVENOUS | Status: AC
  Administered 2011-09-30: 5 mg via INTRAVENOUS

## 2011-09-30 MED ORDER — POLYETHYLENE GLYCOL 3350 17 G PO PACK
17.0000 g | PACK | Freq: Once | ORAL | Status: AC
  Administered 2011-09-30: 17 g via ORAL
  Filled 2011-09-30: qty 1

## 2011-09-30 MED ORDER — METOPROLOL TARTRATE 50 MG PO TABS
50.0000 mg | ORAL_TABLET | Freq: Three times a day (TID) | ORAL | Status: DC
  Administered 2011-09-30 – 2011-10-03 (×9): 50 mg via ORAL
  Filled 2011-09-30 (×12): qty 1

## 2011-09-30 MED ORDER — ALUM & MAG HYDROXIDE-SIMETH 200-200-20 MG/5ML PO SUSP
30.0000 mL | ORAL | Status: DC | PRN
  Administered 2011-09-30 (×2): 30 mL via ORAL
  Filled 2011-09-30 (×2): qty 30

## 2011-09-30 NOTE — Clinical Social Work Placement (Signed)
Clinical Social Work Department CLINICAL SOCIAL WORK PLACEMENT NOTE 09/30/2011  Patient:  [name], [name]  Account Number:  [id number] Admit date:  09/26/2011  Clinical Social Worker:  Jodelle Red  Date/time:  09/30/2011 11:12 AM  Clinical Social Work is seeking post-discharge placement for this patient at the following level of care:   SKILLED NURSING   (*CSW will update this form in Epic as items are completed)   09/29/2011  Patient/family provided with [facility] Department of Clinical Social Work's list of facilities offering this level of care within the geographic area requested by the patient (or if unable, by the patient's family).  09/29/2011  Patient/family informed of their freedom to choose among providers that offer the needed level of care, that participate in Medicare, Medicaid or managed care program needed by the patient, have an available bed and are willing to accept the patient.    Patient/family informed of [facility]' ownership interest in [facility], as well as of the fact that they are under no obligation to receive care at this facility.  PASARR submitted to EDS on 09/29/2011 PASARR number received from EDS on 09/29/2011  FL2 transmitted to all facilities in geographic area requested by pt/family on   FL2 transmitted to all facilities within larger geographic area on   Patient informed that his/her managed care company has contracts with or will negotiate with  certain facilities, including the following:     Patient/family informed of bed offers received:   Patient chooses bed at [facility] Physician recommends and patient chooses bed at    Patient to be transferred to  on   Patient to be transferred to facility by   The following physician request were entered in Epic:   Additional Comments: FL2 COMPLETE AND ON SHADOW CHART FOR SIGNATURE. PT HAS BED AT [facility] WHEN READY FOR D/C PER THEIR REP. CSW TO FOLLOW FOR  RETURN TO SNF.  Vennie Homans, [place] 09/30/2011 11:15 AM [phone]

## 2011-09-30 NOTE — Progress Notes (Signed)
[facility] PROGRESS NOTE Subjective Events of last evening noted. Patient developed chest pain and this resolved with increasing his nitroglycerin. He reports that this is always what happens when he gets the chest pain. He has not had a bowel movement in a couple days. Hemoglobin 9.1 this morning.  Objective: Vital signs in last 24 hours: Temp:  [97.5 F (36.4 C)-98.4 F (36.9 C)] 97.5 F (36.4 C) (05/29 0800) Pulse Rate:  [88-123] 123  (05/29 0800) Resp:  [14-30] 30  (05/29 0800) BP: (94-150)/(50-92) 138/89 mmHg (05/29 0800) SpO2:  [97 %-100 %] 98 % (05/29 0800) Last BM Date: 09/26/11  Intake/Output from previous day: 05/28 0701 - 05/29 0700 In: 2147.5 [P.O.:620; I.V.:1061.7; Blood:455.8; IV Piggyback:10] Out: 1060 [Urine:1060] Intake/Output this shift: Total I/O In: -  Out: 110 [Urine:110]  PE: Alert and oriented Abdomen-soft and nontender  Lab Results:  Basename 09/30/11 0235 09/29/11 1800 09/29/11 0340 09/28/11 0257  WBC 8.2 -- 9.6 12.0*  HGB 9.1* 9.3* 7.8* 9.1*  HCT 26.3* 27.2* 23.8* 26.7*  PLT 167 -- 179 173   BMET  Basename 09/30/11 0235 09/29/11 0340 09/28/11 0257  NA 137 136 136  K 3.8 3.9 4.5  CL 105 106 108  CO2 23 23 21   CREATININE 1.04 1.06 1.06   LFT No results found for this basename: PROT:3ALBUMIN:3,AST:3,ALT:3,ALKPHOS:3,BILITOT:3,BILIDIR:3,IBILI:3 in the last 72 hours PT/INR  Basename 09/28/11 0257  LABPROT 15.8*  INR 1.23   PANCREAS No results found for this basename: LIPASE:3 in the last 72 hours       Studies/Results: No results found.  Medications: I have reviewed the patient's current medications.  Assessment/Plan: 1. GI bleed. EGD showed only esophagitis and duodenitis without active bleeding. There was apparently a small AVM in the duodenum it was not actively bleeding. The patient's hemoglobin has come up after receiving 1 unit of blood. He denies having a bowel movement for 2 days. Since he is still having chest  pain that has presumed to be coming from his heart responding to nitroglycerin, colonoscopy would be risky. He had colonoscopy 2011 by Dr. Ewing Schlein that was negative and is due for repeat procedure 2016. We will keep keep him n.p.o. in the morning and if his hemoglobin drops significantly overnight, we'll consider repeating bedside EGD.   Montrice Montuori JR,Dyamon Sosinski L 09/30/2011, 9:23 AM

## 2011-09-30 NOTE — Progress Notes (Signed)
Dr. [name] returned page. Discussed loose, black stools. VSS. No change in patient's presentation. Will collect a CBC in the morning as scheduled, no current interventions unless patient's presentation changes. Will continue to monitor.

## 2011-09-30 NOTE — Progress Notes (Signed)
Pt at start of shift was on 45 mcq's of nitro. Through out the shift it was slowly titrated down to 5 mcq's at 5:15. While the nurse tech was completing the 12 lead EKG this A.M. he complained of "a burning sensation in his chest". Nitro was increased to 10 mcq's with no relief it was increased again to 15 mcq's. There was no elevation in HR or B/P during this time. Upon reviewing and comparing (with the charge nurse) 12 lead EKG to May 26th & 28th there were no changes.   Zofran was administered and pt was given his boost, the pain was resolved.   Nitro was titrated down again.

## 2011-09-30 NOTE — Progress Notes (Signed)
Dr. [name] paged due to patient having 2 loose, black stools within the past hour. Pt received stool softer and prune juice during the day (per report) because he had not had a BM in several days. Awaiting MD's call.

## 2011-09-30 NOTE — Progress Notes (Signed)
At 1600 - Patient complained of heartburn and given PRN Maalox (see MAR).  At 1622 - Patient stated no relief from Maalox, chest pain increasing and radiating to back and having left arm pain. Patient assessed to be diaphoretic with increased HR and BP. Corine Shelter, [place] at bedside.Patient currently on 2 L nasal cannula and pt refused morphine. Nitro drip at 5 mcg/min at onset of pain, then titrated to 20 mcg/min. I was instructed by PA not to advance nitro drip past 20 mcg/min and give 5 mg IV Lopressor. 12 Lead EKG showed non-STEMI and no acute changes from previous EKGs.  At 1705 - 20 min after lopressor given, patient stated complete relief. Corine Shelter, [place] instructed to leave nitro drip at 20 mcg/min.

## 2011-09-30 NOTE — Progress Notes (Signed)
Subjective:  When I came to see [name] at 4:30pm he was having chest pain/SOB/ diaphoresis after having a can of "Boost".  Objective:  Vital Signs in the last 24 hours: Temp:  [97.4 F (36.3 C)-98.4 F (36.9 C)] 97.4 F (36.3 C) (05/29 1600) Pulse Rate:  [87-123] 106  (05/29 1600) Resp:  [14-30] 28  (05/29 1600) BP: (94-156)/(52-89) 156/84 mmHg (05/29 1600) SpO2:  [96 %-100 %] 100 % (05/29 1600)  Intake/Output from previous day:  Intake/Output Summary (Last 24 hours) at 09/30/11 1657 Last data filed at 09/30/11 1400  Gross per 24 hour  Intake 463.91 ml  Output    760 ml  Net -296.09 ml    Physical Exam: General appearance: alert, cooperative, moderate distress and frail Lungs: Rt basilar crackles Heart: regular rate and rhythm Ext: no c/c/e, 1+ pulses    Rate: 118  Rhythm: sinus tachycardia  Lab Results:  Basename 09/30/11 0235 09/29/11 1800 09/29/11 0340  WBC 8.2 -- 9.6  HGB 9.1* 9.3* --  PLT 167 -- 179    Basename 09/30/11 0235 09/29/11 0340  NA 137 136  K 3.8 3.9  CL 105 106  CO2 23 23  GLUCOSE 104* 97  BUN 19 30*  CREATININE 1.04 1.06    Basename 09/30/11 0935 09/30/11 0235  TROPONINI 0.37* 0.74*   Hepatic Function Panel No results found for this basename: PROT,ALBUMIN,AST,ALT,ALKPHOS,BILITOT,BILIDIR,IBILI in the last 72 hours No results found for this basename: CHOL in the last 72 hours  Basename 09/28/11 0257  INR 1.23    Imaging: Imaging results have been reviewed  Cardiac Studies:  Assessment/Plan:   Principal Problem:  *GI bleed, recurrent, on Coumadin- hgb 7.0 on admission Active Problems:  NSTEMI (non-ST elevated myocardial infarction)  Unstable angina  PE (pulmonary embolism) July 2012  Chronic anticoagulation  DVT (deep venous thrombosis)  Cardiomyopathy, EF 40-50% 2D 09/27/11  Anemia, transfused this admission, HGB 9.1 today after transfusion yesterday  Fracture of hip, right, surg -Feb 2013  PVD, L-R FFBPG '01, RCIA PTA  3/09, REIA PTA 8/11  SBO (small bowel obstruction), lysis of adhesions Feb 2013  CVA, Rt 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 NTG was titrated to 98mcg/min from 12mcg/min without relief. No relief of his "burning" chest pain with Maalox. He finally is pain free after Metoprolol 5mg  IV. His EKG shows NSR, ST with LVH and diffuse ST depression, no ST elevation. I will increase his beta blocker to 50mg  Q8hrs.   Corine Shelter PA-C 09/30/2011, 4:57 PM  He looks much better now after the care given by [name].  CP has resolved & he is resting comfortably.  I reviewed the chart & evaluated the patient, and agree with [name] findings, exam and recommendations.  -- BP is now ~118/68, HR in low 90s.  Continue to titrate BB dose as BP will tolerate. IV NTG rate increased.  No BM in several days, ? If some of his Sx are related to constipation with increased pressure built up in the GI tract exacerbating his gastritis / esophagitis. -- no real relief from Maalox.  Will try Miralax.  H&H stable today.  Will need to reassess tomorrow - if remains stable with no further bleeding, could consider LHC by Friday -- would want to feel comfortable with full anticoagulation with Angiomax & Thienopyridine before proceeding.  Will continue to follow & assess on a day-by-day basis.  Marykay Lex, M.D., M.S. [facility] [address] [place], [place]  [address]  [phone] Pager # [phone]  09/30/2011 5:48 PM

## 2011-09-30 NOTE — Progress Notes (Signed)
Subjective: Patient's chart has been reviewed in detail, apparently presented to hospital with chest pain and melanotic stool. He has ruled in for a non-ST elevation MI. He has been resuscitated with packed red blood cells. Anticoagulation has been stopped with concerns for placement of an IVC filter as she's had 2 GI bleeds this year. Patient underwent EGD  27 which revealed a small hiatal hernia, gastritis, bulbar duodenitis.he has been seen by cardiology. Currently he is on nitroglycerin drip with hopeful plans for heart catheterization to evaluate coronary anatomy with hemoglobin remains stable. He has a significant history of previous GI bleed, CVA, DVT, history of PE, peripheral vascular disease, asthma, history of sinus tachycardia, history of bladder outlet obstruction, history of ileus/bowel obstruction required surgery. This morning he was complaining of some chest discomfort which did respond to nitroglycerin, EKG without any acute changes. There's been no repeated comments about blood per rectum  Objective: Vital signs in last 24 hours: Temp:  [97.4 F (36.3 C)-98.4 F (36.9 C)] 97.4 F (36.3 C) (05/29 1200) Pulse Rate:  [89-123] 108  (05/29 0944) Resp:  [14-30] 30  (05/29 0800) BP: (94-150)/(53-92) 112/67 mmHg (05/29 0944) SpO2:  [97 %-100 %] 98 % (05/29 0800) Weight change:  Last BM Date: 09/26/11  Intake/Output from previous day: 05/28 0701 - 05/29 0700 In: 2147.5 [P.O.:620; I.V.:1061.7; Blood:455.8; IV Piggyback:10] Out: 1060 [Urine:1060] Intake/Output this shift: Total I/O In: -  Out: 110 [Urine:110]  General appearance: alert and cooperative Resp: clear to auscultation bilaterally Cardio: sinus tach GI: slightly protuberant, positive bowel sounds, no hepatosplenomegaly Extremities: extremities normal, atraumatic, no cyanosis or edema  Lab Results:  Results for orders placed during the hospital encounter of 09/26/11 (from the past 24 hour(s))  HEMOGLOBIN AND  HEMATOCRIT, BLOOD     Status: Abnormal   Collection Time   09/29/11  6:00 PM      Component Value Range   Hemoglobin 9.3 (*) 13.0 - 17.0 (g/dL)   HCT 56.2 (*) 13.0 - 52.0 (%)  CARDIAC PANEL(CRET KIN+CKTOT+MB+TROPI)     Status: Abnormal   Collection Time   09/29/11  6:15 PM      Component Value Range   Total CK 38  7 - 232 (U/L)   CK, MB 2.5  0.3 - 4.0 (ng/mL)   Troponin I 0.72 (*) <0.30 (ng/mL)   Relative Index RELATIVE INDEX IS INVALID  0.0 - 2.5   CARDIAC PANEL(CRET KIN+CKTOT+MB+TROPI)     Status: Abnormal   Collection Time   09/30/11  2:35 AM      Component Value Range   Total CK 33  7 - 232 (U/L)   CK, MB 2.2  0.3 - 4.0 (ng/mL)   Troponin I 0.74 (*) <0.30 (ng/mL)   Relative Index RELATIVE INDEX IS INVALID  0.0 - 2.5   CBC     Status: Abnormal   Collection Time   09/30/11  2:35 AM      Component Value Range   WBC 8.2  4.0 - 10.5 (K/uL)   RBC 3.10 (*) 4.22 - 5.81 (MIL/uL)   Hemoglobin 9.1 (*) 13.0 - 17.0 (g/dL)   HCT 86.5 (*) 78.4 - 52.0 (%)   MCV 84.8  78.0 - 100.0 (fL)   MCH 29.4  26.0 - 34.0 (pg)   MCHC 34.6  30.0 - 36.0 (g/dL)   RDW 69.6  29.5 - 28.4 (%)   Platelets 167  150 - 400 (K/uL)  BASIC METABOLIC PANEL     Status: Abnormal  Collection Time   09/30/11  2:35 AM      Component Value Range   Sodium 137  135 - 145 (mEq/L)   Potassium 3.8  3.5 - 5.1 (mEq/L)   Chloride 105  96 - 112 (mEq/L)   CO2 23  19 - 32 (mEq/L)   Glucose, Bld 104 (*) 70 - 99 (mg/dL)   BUN 19  6 - 23 (mg/dL)   Creatinine, Ser 1.61  0.50 - 1.35 (mg/dL)   Calcium 8.2 (*) 8.4 - 10.5 (mg/dL)   GFR calc non Af Amer 68 (*) >90 (mL/min)   GFR calc Af Amer 78 (*) >90 (mL/min)  CARDIAC PANEL(CRET KIN+CKTOT+MB+TROPI)     Status: Abnormal   Collection Time   09/30/11  9:35 AM      Component Value Range   Total CK 33  7 - 232 (U/L)   CK, MB 2.2  0.3 - 4.0 (ng/mL)   Troponin I 0.37 (*) <0.30 (ng/mL)   Relative Index RELATIVE INDEX IS INVALID  0.0 - 2.5       Studies/Results: No results  found.  Medications:  Prior to Admission:  Prescriptions prior to admission  Medication Sig Dispense Refill  . clopidogrel (PLAVIX) 75 MG tablet Take 75 mg by mouth daily.      Marland Kitchen diltiazem (DILACOR XR) 180 MG 24 hr capsule Take 180 mg by mouth daily.      Marland Kitchen lactose free nutrition (BOOST PLUS) LIQD Take 237 mLs by mouth 3 (three) times daily with meals.      . metoCLOPramide (REGLAN) 10 MG tablet Take 10 mg by mouth 3 (three) times daily.      . metoprolol (LOPRESSOR) 50 MG tablet Take 1 tablet (50 mg total) by mouth 2 (two) times daily.      . Multiple Vitamin (MULITIVITAMIN WITH MINERALS) TABS Take 1 tablet by mouth daily.      . NON FORMULARY 30 mLs 2 (two) times daily. Provide Gold Regular Liquid - for wound healing.      . pantoprazole (PROTONIX) 40 MG tablet Take 40 mg by mouth daily.      . simvastatin (ZOCOR) 10 MG tablet Take 10 mg by mouth daily.      . Tamsulosin HCl (FLOMAX) 0.4 MG CAPS Take 0.4 mg by mouth daily.      . vitamin C (ASCORBIC ACID) 500 MG tablet Take 500 mg by mouth 2 (two) times daily.      Marland Kitchen warfarin (COUMADIN) 10 MG tablet Take 10 mg by mouth daily.      . metoCLOPramide (REGLAN) 10 MG tablet Take 1 tablet (10 mg total) by mouth 3 (three) times daily before meals.       Scheduled:   . antiseptic oral rinse  15 mL Mouth Rinse q12n4p  . lactose free nutrition  237 mL Oral BID BM  . metoprolol  2.5 mg Intravenous Once  . metoprolol tartrate  50 mg Oral BID  . mupirocin ointment  1 application Nasal BID  . pantoprazole (PROTONIX) IV  40 mg Intravenous Q12H  . simvastatin  10 mg Oral q1800  . DISCONTD: metoprolol  5 mg Intravenous Once   Continuous:   . nitroGLYCERIN 10 mcg/min (09/30/11 1035)    Assessment/Plan: GI bleed, no active bleeding found on EGD however gastritis and duodenitis was found. Patient's hemoglobin is stable. Currently he is off anti-coagulation. He was on Coumadin and Plavix. Non-ST elevation MI, management per cardiology, await input  in reference to  heart catheterization. Please note echo did show LV dysfunction and some wall motion abnormality. Sinus tachycardia, this has been a problem on previous admissions. History of CVA, now with left hemiparesis Peripheral vascular disease Recent bowel obstruction requiring lysis of adhesions History of PE DVT, anti-coagulation on hold because of active bleeding. Bladder outlet obstruction, patient has required a Foley catheter since discharge from the hospital, he has been seen by urology outpatient  History of chronic kidney disease current creatinine stable . LOS: 4 days   [name] D 09/30/2011, 12:34 PM

## 2011-09-30 NOTE — Progress Notes (Signed)
At 22:20 nitro gtt was paused for less than two minutes to administer an IVP medication. Chest pain started when gtt was paused. Drip was immediately restarted and dose was increased temporarily to relieve pain (see MAR). Patient is now sound asleep. He was woken up at 23:30 and assessed for chest pain. Pt stated that pain was gone and went back to sleep. Drip was titrated back down. Will continue to monitor patient and titrate nitro down as pt is able to tolerate it.

## 2011-10-01 LAB — TROPONIN I: Troponin I: 0.45 ng/mL (ref <0.30)

## 2011-10-01 LAB — DIFFERENTIAL
Basophils Absolute: 0 10*3/uL (ref 0.0–0.1)
Lymphocytes Relative: 22 % (ref 12–46)
Lymphs Abs: 1.7 10*3/uL (ref 0.7–4.0)
Neutro Abs: 4.8 10*3/uL (ref 1.7–7.7)
Neutrophils Relative %: 64 % (ref 43–77)

## 2011-10-01 LAB — CBC
Platelets: 185 10*3/uL (ref 150–400)
RBC: 3.21 MIL/uL — ABNORMAL LOW (ref 4.22–5.81)
RDW: 15.3 % (ref 11.5–15.5)
WBC: 7.5 10*3/uL (ref 4.0–10.5)

## 2011-10-01 MED ORDER — ASPIRIN 81 MG PO CHEW
324.0000 mg | CHEWABLE_TABLET | ORAL | Status: AC
  Administered 2011-10-02: 324 mg via ORAL
  Filled 2011-10-01: qty 4

## 2011-10-01 MED ORDER — PANTOPRAZOLE SODIUM 40 MG PO TBEC
40.0000 mg | DELAYED_RELEASE_TABLET | Freq: Two times a day (BID) | ORAL | Status: DC
  Administered 2011-10-01 – 2011-10-05 (×9): 40 mg via ORAL
  Filled 2011-10-01 (×12): qty 1

## 2011-10-01 MED ORDER — SODIUM CHLORIDE 0.9 % IV SOLN
INTRAVENOUS | Status: DC
  Administered 2011-10-02: 05:00:00 via INTRAVENOUS
  Administered 2011-10-02 – 2011-10-05 (×2): 50 mL via INTRAVENOUS

## 2011-10-01 MED ORDER — POLYETHYLENE GLYCOL 3350 17 G PO PACK
17.0000 g | PACK | Freq: Every day | ORAL | Status: DC
  Administered 2011-10-04 – 2011-10-08 (×4): 17 g via ORAL
  Filled 2011-10-01 (×12): qty 1

## 2011-10-01 MED ORDER — SODIUM CHLORIDE 0.9 % IJ SOLN
3.0000 mL | Freq: Two times a day (BID) | INTRAMUSCULAR | Status: DC
  Administered 2011-10-02: 3 mL via INTRAVENOUS

## 2011-10-01 MED ORDER — SODIUM CHLORIDE 0.9 % IJ SOLN: 3.0000 mL | INTRAMUSCULAR | Status: DC | PRN

## 2011-10-01 MED ORDER — SODIUM CHLORIDE 0.9 % IV SOLN: 250.0000 mL | INTRAVENOUS | Status: DC | PRN

## 2011-10-01 NOTE — Progress Notes (Signed)
[facility] PROGRESS NOTE Subjective Patient has been n.p.o. in case EGD needed this morning. He had dark bowel movement after laxative no gross bleeding. Hemoglobin up to 9.3 this morning last transfusion 2 days ago. According to the patient and the nursing staff, whenever they stop his IV nitroglycerin to give him Protonix IV he gets chest which resolves when the nitroglycerin drip is resumed. Objective: Vital signs in last 24 hours: Temp:  [97.3 F (36.3 C)-97.7 F (36.5 C)] 97.3 F (36.3 C) (05/30 0400) Pulse Rate:  [84-115] 87  (05/30 0600) Resp:  [11-29] 13  (05/30 0600) BP: (99-156)/(51-84) 112/58 mmHg (05/30 0600) SpO2:  [94 %-100 %] 97 % (05/30 0600) Weight:  [60.7 kg (133 lb 13.1 oz)] 60.7 kg (133 lb 13.1 oz) (05/30 0005) Last BM Date: 09/30/11  Intake/Output from previous day: 05/29 0701 - 05/30 0700 In: 1664.1 [P.O.:1300; I.V.:344.1; IV Piggyback:20] Out: 331 [Urine:331] Intake/Output this shift:    PE: Alert and oriented with no complaints of pain abdomen-soft and nontender  Lab Results:  Basename 10/01/11 0325 09/30/11 0235 09/29/11 1800 09/29/11 0340  WBC 7.5 8.2 -- 9.6  HGB 9.3* 9.1* 9.3* 7.8*  HCT 27.8* 26.3* 27.2* 23.8*  PLT 185 167 -- 179   BMET  Basename 09/30/11 0235 09/29/11 0340  NA 137 136  K 3.8 3.9  CL 105 106  CO2 23 23  CREATININE 1.04 1.06   LFT No results found for this basename: PROT:3ALBUMIN:3,AST:3,ALT:3,ALKPHOS:3,BILITOT:3,BILIDIR:3,IBILI:3 in the last 72 hours PT/INR No results found for this basename: LABPROT:3,INR:3 in the last 72 hours PANCREAS No results found for this basename: LIPASE:3 in the last 72 hours       Studies/Results: No results found.  Medications: I have reviewed the patient's current medications.  Assessment/Plan: 1. GI bleed-appears stable esophagitis and gastritis on EGD. No transfusion now for 36 hours. We'll resume full liquid diet and change to by mouth Protonix. Will add chronic  MiraLAX. 2. Chest pain-appears to be cardiac although he ulcerative esophagitis could be contributing. His pain reoccurs nitroglycerin drip has stopped and improves with resumption of nitroglycerin. Cardiology planning cath in the near future. Suspect the majority of the pain at this point, is a result of unstable angina   [name],[name] 10/01/2011, 8:04 AM

## 2011-10-01 NOTE — Progress Notes (Signed)
Subjective: Patient has been doing well, no bleeding overnight. He states he gets chest pain when the nitroglycerin is off. Hemoglobin remained stable. Patient's Foley catheter has been removed by nursing. He's had bladder outlet obstruction and has required a Foley catheter for the last month and a half.  Objective: Vital signs in last 24 hours: Temp:  [97.3 F (36.3 C)-98 F (36.7 C)] 98 F (36.7 C) (05/30 0800) Pulse Rate:  [84-111] 105  (05/30 1145) Resp:  [11-29] 23  (05/30 0800) BP: (94-156)/(51-84) 114/59 mmHg (05/30 1145) SpO2:  [94 %-100 %] 98 % (05/30 0800) Weight:  [60.7 kg (133 lb 13.1 oz)] 60.7 kg (133 lb 13.1 oz) (05/30 0005) Weight change:  Last BM Date: 10/01/11  Intake/Output from previous day: 05/29 0701 - 05/30 0700 In: 1678.6 [P.O.:1300; I.V.:358.6; IV Piggyback:20] Out: 331 [Urine:331] Intake/Output this shift: Total I/O In: 163.5 [P.O.:120; I.V.:43.5] Out: -   General appearance: alert and cooperative Resp: clear to auscultation bilaterally Cardio: regular rate and rhythm, S1, S2 normal, no murmur, click, rub or gallop GI: soft, non-tender; bowel sounds normal; no masses,  no organomegaly Extremities: extremities normal, atraumatic, no cyanosis or edema  Lab Results:  Results for orders placed during the hospital encounter of 09/26/11 (from the past 24 hour(s))  TROPONIN I     Status: Abnormal   Collection Time   10/01/11  3:25 AM      Component Value Range   Troponin I 0.45 (*) <0.30 (ng/mL)  CBC     Status: Abnormal   Collection Time   10/01/11  3:25 AM      Component Value Range   WBC 7.5  4.0 - 10.5 (K/uL)   RBC 3.21 (*) 4.22 - 5.81 (MIL/uL)   Hemoglobin 9.3 (*) 13.0 - 17.0 (g/dL)   HCT 16.1 (*) 09.6 - 52.0 (%)   MCV 86.6  78.0 - 100.0 (fL)   MCH 29.0  26.0 - 34.0 (pg)   MCHC 33.5  30.0 - 36.0 (g/dL)   RDW 04.5  40.9 - 81.1 (%)   Platelets 185  150 - 400 (K/uL)  DIFFERENTIAL     Status: Abnormal   Collection Time   10/01/11  3:25 AM   Component Value Range   Neutrophils Relative 64  43 - 77 (%)   Neutro Abs 4.8  1.7 - 7.7 (K/uL)   Lymphocytes Relative 22  12 - 46 (%)   Lymphs Abs 1.7  0.7 - 4.0 (K/uL)   Monocytes Relative 7  3 - 12 (%)   Monocytes Absolute 0.5  0.1 - 1.0 (K/uL)   Eosinophils Relative 7 (*) 0 - 5 (%)   Eosinophils Absolute 0.5  0.0 - 0.7 (K/uL)   Basophils Relative 0  0 - 1 (%)   Basophils Absolute 0.0  0.0 - 0.1 (K/uL)      Studies/Results: No results found.  Medications:  Prior to Admission:  Prescriptions prior to admission  Medication Sig Dispense Refill  . clopidogrel (PLAVIX) 75 MG tablet Take 75 mg by mouth daily.      Marland Kitchen diltiazem (DILACOR XR) 180 MG 24 hr capsule Take 180 mg by mouth daily.      Marland Kitchen lactose free nutrition (BOOST PLUS) LIQD Take 237 mLs by mouth 3 (three) times daily with meals.      . metoCLOPramide (REGLAN) 10 MG tablet Take 10 mg by mouth 3 (three) times daily.      . metoprolol (LOPRESSOR) 50 MG tablet Take 1 tablet (50  mg total) by mouth 2 (two) times daily.      . Multiple Vitamin (MULITIVITAMIN WITH MINERALS) TABS Take 1 tablet by mouth daily.      . NON FORMULARY 30 mLs 2 (two) times daily. Provide Gold Regular Liquid - for wound healing.      . pantoprazole (PROTONIX) 40 MG tablet Take 40 mg by mouth daily.      . simvastatin (ZOCOR) 10 MG tablet Take 10 mg by mouth daily.      . Tamsulosin HCl (FLOMAX) 0.4 MG CAPS Take 0.4 mg by mouth daily.      . vitamin C (ASCORBIC ACID) 500 MG tablet Take 500 mg by mouth 2 (two) times daily.      Marland Kitchen warfarin (COUMADIN) 10 MG tablet Take 10 mg by mouth daily.      . metoCLOPramide (REGLAN) 10 MG tablet Take 1 tablet (10 mg total) by mouth 3 (three) times daily before meals.       Scheduled:   . antiseptic oral rinse  15 mL Mouth Rinse q12n4p  . chlorhexidine  15 mL Mouth Rinse BID  . lactose free nutrition  237 mL Oral BID BM  . metoprolol  2.5 mg Intravenous Once  . metoprolol  5 mg Intravenous Once  . metoprolol  tartrate  50 mg Oral TID  . mupirocin ointment  1 application Nasal BID  . pantoprazole  40 mg Oral BID AC  . polyethylene glycol  17 g Oral Once  . polyethylene glycol  17 g Oral Daily  . simvastatin  10 mg Oral q1800  . DISCONTD: metoprolol tartrate  50 mg Oral BID  . DISCONTD: pantoprazole (PROTONIX) IV  40 mg Intravenous Q12H   Continuous:   . sodium chloride    . nitroGLYCERIN 15 mcg/min (10/01/11 1112)    Assessment/Plan: GI bleed, H&H has remained stable., Continue to hold anticoagulation. As this is the second GI bleed this year plans to continue holding at this time. Non-ST elevation MI, for heart catheterization Sinus tachycardia History of CVA now with left hemiparesis Peripheral vascular disease History of DVT PE plans for [place] filter History of bladder outlet obstruction History of chronic kidney disease   LOS: 5 days   Chenae Brager D 10/01/2011, 1:32 PM

## 2011-10-01 NOTE — Progress Notes (Signed)
Pt has slept throughout the night with no pain since Nitro titrated down to 33mcg/min at 02:15 this morning. Patient is now awake and has been placed back on 1L nasal cannula. He tolerated room air but given current issues with chest pain, 1L was restarted to increase sats from mid 90s to high 90s.   No further episodes of stool during the night. Hbg remained stable in AM labs.   Pt voids in urinal but states that it is very difficult for him to use given his left sided weakness and difficulty moving in bed. Pt has requested that a foley catheter be re-inserted. This RN told him that the MD would have to approve the request given that he is alert and able to void in the urinal. This information will be passed along in report to day shift RN.

## 2011-10-01 NOTE — Clinical Social Work Note (Signed)
CSW updated FL2 and placed in shadow chart for signatures. CSW made aware by RN that Pt may be transferred tomorrow to [facility] for cardiac cath. Pt has bed at [facility] when ready for d/c. This CSW will report case to appropriate [facility] CSW when transfer occurs.  Vennie Homans, [place] 10/01/2011 9:57 AM [phone]

## 2011-10-01 NOTE — Plan of Care (Signed)
Problem: Phase I Progression Outcomes Goal: Voiding-avoid urinary catheter unless indicated Outcome: Completed/Met Date Met:  10/01/11 Urinary catheter removed today. Pt voiding with urinal     Problem: Phase II Progression Outcomes Goal: Cath/PCI Day Path if indicated Outcome: Progressing Pt is supposed to be transferred to [facility] when hemodynamically stable for 48 hrs

## 2011-10-01 NOTE — Progress Notes (Signed)
[facility]  Subjective: Pt states that if the nitro is stopped, he gets CP and SOB immediately.  Two dark BMs yesterday.  Objective: Vital signs in last 24 hours: Temp:  [97.3 F (36.3 C)-98 F (36.7 C)] 98 F (36.7 C) (05/30 0800) Pulse Rate:  [84-115] 111  (05/30 0800) Resp:  [11-29] 23  (05/30 0800) BP: (94-156)/(51-84) 94/57 mmHg (05/30 0700) SpO2:  [94 %-100 %] 98 % (05/30 0800) Weight:  [60.7 kg (133 lb 13.1 oz)] 60.7 kg (133 lb 13.1 oz) (05/30 0005) Last BM Date: 09/30/11  Intake/Output from previous day: 05/29 0701 - 05/30 0700 In: 1664.1 [P.O.:1300; I.V.:344.1; IV Piggyback:20] Out: 331 [Urine:331] Intake/Output this shift:    Medications Current Facility-Administered Medications  Medication Dose Route Frequency Provider Last Rate Last Dose  . 0.9 %  sodium chloride infusion  250 mL Intravenous PRN Orbie Hurst, MD 20 mL/hr at 09/27/11 1205    . alum & mag hydroxide-simeth (MAALOX/MYLANTA) 200-200-20 MG/5ML suspension 30 mL  30 mL Oral PRN Darnelle Bos, MD   30 mL at 09/30/11 1606  . antiseptic oral rinse (BIOTENE) solution 15 mL  15 mL Mouth Rinse q12n4p Merwyn Katos, MD   15 mL at 09/30/11 1508  . chlorhexidine (PERIDEX) 0.12 % solution 15 mL  15 mL Mouth Rinse BID Katy Apo, MD   15 mL at 09/30/11 2205  . HYDROcodone-homatropine (HYCODAN) 5-1.5 MG/5ML syrup 5 mL  5 mL Oral Q6H PRN Coralyn Helling, MD   5 mL at 09/30/11 2220  . lactose free nutrition (BOOST PLUS) liquid 237 mL  237 mL Oral BID BM Anastasio Champion, RD   237 mL at 09/30/11 1445  . metoprolol (LOPRESSOR) injection 2.5 mg  2.5 mg Intravenous Once Nada Boozer, NP      . metoprolol (LOPRESSOR) injection 5 mg  5 mg Intravenous Once Eda Paschal [place], [place]   5 mg at 09/30/11 1646  . metoprolol (LOPRESSOR) tablet 50 mg  50 mg Oral TID Abelino Derrick, PA   50 mg at 09/30/11 2205  . morphine 2 MG/ML injection 1 mg  1 mg Intravenous Q4H PRN Orbie Hurst, MD   1 mg at 09/30/11  0757  . mupirocin ointment (BACTROBAN) 2 % 1 application  1 application Nasal BID Orbie Hurst, MD   1 application at 09/30/11 2206  . nitroGLYCERIN (NITROSTAT) SL tablet 0.4 mg  0.4 mg Sublingual Q5 Min x 3 PRN Catha Brow, MD   0.4 mg at 09/28/11 4540  . nitroGLYCERIN 0.2 mg/mL in dextrose 5 % infusion  5 mcg/min Intravenous Titrated Simonne Martinet, NP 4.5 mL/hr at 10/01/11 0600 15 mcg/min at 10/01/11 0600  . ondansetron (ZOFRAN) injection 4 mg  4 mg Intravenous Q8H PRN Orbie Hurst, MD   4 mg at 09/30/11 9811  . pantoprazole (PROTONIX) EC tablet 40 mg  40 mg Oral BID AC Vertell Novak., MD      . polyethylene glycol [facility] Ethelene Hal) packet 17 g  17 g Oral Once Marykay Lex, MD   17 g at 09/30/11 1751  . polyethylene glycol (MIRALAX / GLYCOLAX) packet 17 g  17 g Oral Daily Vertell Novak., MD      . simvastatin (ZOCOR) tablet 10 mg  10 mg Oral [address] [place], [place]   10 mg at 09/30/11 1750  . DISCONTD: metoprolol (LOPRESSOR) tablet 50 mg  50 mg Oral BID Abelino Derrick, [place]  50 mg at 09/30/11 0944  . DISCONTD: pantoprazole (PROTONIX) injection 40 mg  40 mg Intravenous Q12H Simonne Martinet, NP   40 mg at 09/30/11 2205    PE: General appearance: alert, cooperative and no distress Lungs: Bilateral rales greater on the left. Heart: Regular rhythm, rate elevated. No MM, R, G Abdomen: +BS, nontender. Extremities: No LEE Pulses: 2+ and symmetric  Lab Results:   Basename 10/01/11 0325 09/30/11 0235 09/29/11 1800 09/29/11 0340  WBC 7.5 8.2 -- 9.6  HGB 9.3* 9.1* 9.3* --  HCT 27.8* 26.3* 27.2* --  PLT 185 167 -- 179   BMET  Basename 09/30/11 0235 09/29/11 0340  NA 137 136  K 3.8 3.9  CL 105 106  CO2 23 23  GLUCOSE 104* 97  BUN 19 30*  CREATININE 1.04 1.06  CALCIUM 8.2* 8.1*   Cardiac Enzymes CK, MB 4.0 3.2 2.5 2.2 2.2  Total CK 56 44 38 33 33   Troponin I 1.16, 1.06, 0.72, 0.74, 0.37, 0.45(last)  Assessment/Plan  Principal Problem:  *GI bleed, recurrent, on  Coumadin- hgb 7.0 on admission Active Problems:  PE (pulmonary embolism) July 2012  Chronic anticoagulation  Fracture of hip, right, surg -Feb 2013  PVD, L-R FFBPG '01, RCIA PTA 3/09, REIA PTA 8/11  SBO (small bowel obstruction), lysis of adhesions Feb 2013  NSTEMI (non-ST elevated myocardial infarction)  DVT (deep venous thrombosis)  CVA, 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  Plan:  Slight bump back up in troponin after trending down.  Do NOT titrate the nitro drip down unless SBP < and it is close.   Lopressor titrated to 50mg  Q8 yesterday.  HR just over 100.  Stools were not heme checked.  Hgb has been stable since 1800hrs on 5/28.  Anticipate left heart cath tomorrow.  Orders written. Not on any antiplatelet therapy at this time.  Will order precath NS at 78ml/hr at 0400hrs tomorrow.  He has bilateral rales so I would not hydrate at usual rate.   LOS: 5 days    HAGER, BRYAN 10/01/2011 8:21 AM  I have reviewed the chart, notes and new data.  I agree with PA/NP's note.  PLAN: Still a little early to be sure no active bleeding. Tentatively scheduled for cath in AM, but needs to be evaluated by MD first.  Thurmon Fair, MD, [facility] [phone] 10/01/2011, 5:35 PM

## 2011-10-01 NOTE — Progress Notes (Signed)
CARE MANAGEMENT NOTE 10/01/2011  Patient:  JAYLON, BOYLEN   Account Number:  [id number]  Date Initiated:  09/28/2011  Documentation initiated by:  Bobetta Korf  Subjective/Objective Assessment:   pt with confirmed pe, and chest pain with gi bleed     Action/Plan:   lives at home   Anticipated DC Date:  10/04/2011   Anticipated DC Plan:  HOME/SELF CARE  In-house referral  NA      DC Planning Services  NA      [facility] Choice  NA   Choice offered to / List presented to:  NA   DME arranged  NA      DME agency  NA     HH arranged  NA      HH agency  NA   Status of service:  In process, will continue to follow Medicare Important Message given?  NA - LOS <3 / Initial given by admissions (If response is "NO", the following Medicare IM given date fields will be blank) Date Medicare IM given:   Date Additional Medicare IM given:    Discharge Disposition:    Per UR Regulation:  Reviewed for med. necessity/level of care/duration of stay  If discussed at Long Length of Stay Meetings, dates discussed:    Comments:  [id number] Marcelle Smiling, Rn,BSN,CCM Patient continues to require iv ntg drip for chest pain. Cardiac in to see and treat.   [id number] Marcelle Smiling, RN,BSN,CCM No discharge needs present at time of this review Case Management [phone]

## 2011-10-01 NOTE — Progress Notes (Signed)
Subjective:  No CP while on iv NTG  Objective:  Temp:  [97.3 F (36.3 C)-98.4 F (36.9 C)] 98.4 F (36.9 C) (05/30 1600) Pulse Rate:  [84-111] 108  (05/30 1400) Resp:  [11-29] 24  (05/30 1400) BP: (94-126)/(51-65) 114/61 mmHg (05/30 1400) SpO2:  [94 %-99 %] 97 % (05/30 1400) Weight:  [60.7 kg (133 lb 13.1 oz)] 60.7 kg (133 lb 13.1 oz) (05/30 0005) Weight change:   Intake/Output from previous day: 05/29 0701 - 05/30 0700 In: 1678.6 [P.O.:1300; I.V.:358.6; IV Piggyback:20] Out: 331 [Urine:331]  Intake/Output from this shift: Total I/O In: 901.5 [P.O.:800; I.V.:101.5] Out: -   Physical Exam: General appearance: alert and cooperative Neck: no adenopathy, no carotid bruit, no JVD, supple, symmetrical, trachea midline and thyroid not enlarged, symmetric, no tenderness/mass/nodules Lungs: clear to auscultation bilaterally Heart: regular rate and rhythm, S1, S2 normal, no murmur, click, rub or gallop Extremities: extremities normal, atraumatic, no cyanosis or edema  Lab Results: Results for orders placed during the hospital encounter of 09/26/11 (from the past 48 hour(s))  CARDIAC PANEL(CRET KIN+CKTOT+MB+TROPI)     Status: Abnormal   Collection Time   09/30/11  2:35 AM      Component Value Range Comment   Total CK 33  7 - 232 (U/L)    CK, MB 2.2  0.3 - 4.0 (ng/mL)    Troponin I 0.74 (*) <0.30 (ng/mL)    Relative Index RELATIVE INDEX IS INVALID  0.0 - 2.5    CBC     Status: Abnormal   Collection Time   09/30/11  2:35 AM      Component Value Range Comment   WBC 8.2  4.0 - 10.5 (K/uL)    RBC 3.10 (*) 4.22 - 5.81 (MIL/uL)    Hemoglobin 9.1 (*) 13.0 - 17.0 (g/dL)    HCT 91.4 (*) 78.2 - 52.0 (%)    MCV 84.8  78.0 - 100.0 (fL)    MCH 29.4  26.0 - 34.0 (pg)    MCHC 34.6  30.0 - 36.0 (g/dL)    RDW 95.6  21.3 - 08.6 (%)    Platelets 167  150 - 400 (K/uL)   BASIC METABOLIC PANEL     Status: Abnormal   Collection Time   09/30/11  2:35 AM      Component Value Range Comment   Sodium 137  135 - 145 (mEq/L)    Potassium 3.8  3.5 - 5.1 (mEq/L)    Chloride 105  96 - 112 (mEq/L)    CO2 23  19 - 32 (mEq/L)    Glucose, Bld 104 (*) 70 - 99 (mg/dL)    BUN 19  6 - 23 (mg/dL)    Creatinine, Ser 5.78  0.50 - 1.35 (mg/dL)    Calcium 8.2 (*) 8.4 - 10.5 (mg/dL)    GFR calc non Af Amer 68 (*) >90 (mL/min)    GFR calc Af Amer 78 (*) >90 (mL/min)   CARDIAC PANEL(CRET KIN+CKTOT+MB+TROPI)     Status: Abnormal   Collection Time   09/30/11  9:35 AM      Component Value Range Comment   Total CK 33  7 - 232 (U/L)    CK, MB 2.2  0.3 - 4.0 (ng/mL)    Troponin I 0.37 (*) <0.30 (ng/mL)    Relative Index RELATIVE INDEX IS INVALID  0.0 - 2.5    TROPONIN I     Status: Abnormal   Collection Time   10/01/11  3:25 AM  Component Value Range Comment   Troponin I 0.45 (*) <0.30 (ng/mL)   CBC     Status: Abnormal   Collection Time   10/01/11  3:25 AM      Component Value Range Comment   WBC 7.5  4.0 - 10.5 (K/uL)    RBC 3.21 (*) 4.22 - 5.81 (MIL/uL)    Hemoglobin 9.3 (*) 13.0 - 17.0 (g/dL)    HCT 16.1 (*) 09.6 - 52.0 (%)    MCV 86.6  78.0 - 100.0 (fL)    MCH 29.0  26.0 - 34.0 (pg)    MCHC 33.5  30.0 - 36.0 (g/dL)    RDW 04.5  40.9 - 81.1 (%)    Platelets 185  150 - 400 (K/uL)   DIFFERENTIAL     Status: Abnormal   Collection Time   10/01/11  3:25 AM      Component Value Range Comment   Neutrophils Relative 64  43 - 77 (%)    Neutro Abs 4.8  1.7 - 7.7 (K/uL)    Lymphocytes Relative 22  12 - 46 (%)    Lymphs Abs 1.7  0.7 - 4.0 (K/uL)    Monocytes Relative 7  3 - 12 (%)    Monocytes Absolute 0.5  0.1 - 1.0 (K/uL)    Eosinophils Relative 7 (*) 0 - 5 (%)    Eosinophils Absolute 0.5  0.0 - 0.7 (K/uL)    Basophils Relative 0  0 - 1 (%)    Basophils Absolute 0.0  0.0 - 0.1 (K/uL)     Imaging: Imaging results have been reviewed  Assessment/Plan:   1. Principal Problem: 2.  *GI bleed, recurrent, on Coumadin- hgb 7.0 on admission 3. Active Problems: 4.  PE (pulmonary embolism)  July 2012 5.  Chronic anticoagulation 6.  Fracture of hip, right, surg -Feb 2013 7.  PVD, L-R FFBPG '01, RCIA PTA 3/09, REIA PTA 8/11 8.  SBO (small bowel obstruction), lysis of adhesions Feb 2013 9.  NSTEMI (non-ST elevated myocardial infarction) 10.  DVT (deep venous thrombosis) 11.  CVA, Rt brain Feb 2013 12.  History of GI bleed Feb 2013 13.  History of renal insufficiency syndrome, 7/12 and 3/13- SCr Nl (1.0) now 14.  Cardiomyopathy, EF 40-50% 2D 09/27/11 15.  Unstable angina 16.  Anemia, transfused this admission. 17.   Time Spent Directly with Patient:  20 minutes  Length of Stay:  LOS: 5 days   Pt well known to me with PVOD but no previously documented CAD. Admitted with USA/ACS and NSTEMI. EKG shows ST with diffuse ST depression. Trop + 1.2. Pt says that when IV NTG is off the CP returns. He has had a GIB as well. Needs cath to define anatomy on schedule for tomorrow but difficulty management decision regarding possible intervention in setting of GIB and treatment with DAPT. Was on coumadin on admissioon for ? PE and CVA.   Runell Gess 10/01/2011, 6:15 PM

## 2011-10-01 NOTE — Progress Notes (Signed)
Patient has no chest pain/ discomfort at this time. Nitro has been titrated down from 87mcg/min to 98mcg/min. Will assess for chest pain and titrate up again if needed.

## 2011-10-02 ENCOUNTER — Encounter (HOSPITAL_COMMUNITY)
Admission: EM | Disposition: A | Discharge status: Skilled Nursing Facility | Payer: Self-pay | Source: Home / Self Care | Attending: Internal Medicine

## 2011-10-02 DIAGNOSIS — I2510 Atherosclerotic heart disease of native coronary artery without angina pectoris: Secondary | ICD-10-CM

## 2011-10-02 HISTORY — PX: LEFT HEART CATHETERIZATION WITH CORONARY ANGIOGRAM: SHX5451

## 2011-10-02 HISTORY — DX: Atherosclerotic heart disease of native coronary artery without angina pectoris: I25.10

## 2011-10-02 LAB — CBC
Hemoglobin: 9.8 g/dL — ABNORMAL LOW (ref 13.0–17.0)
MCH: 28.8 pg (ref 26.0–34.0)
RBC: 3.4 MIL/uL — ABNORMAL LOW (ref 4.22–5.81)

## 2011-10-02 SURGERY — LEFT HEART CATHETERIZATION WITH CORONARY ANGIOGRAM
Anesthesia: LOCAL

## 2011-10-02 MED ORDER — SODIUM CHLORIDE 0.9 % IJ SOLN
3.0000 mL | Freq: Two times a day (BID) | INTRAMUSCULAR | Status: DC
  Administered 2011-10-02 – 2011-10-04 (×5): 3 mL via INTRAVENOUS

## 2011-10-02 MED ORDER — BIVALIRUDIN 250 MG IV SOLR
INTRAVENOUS | Status: AC
  Filled 2011-10-02: qty 250

## 2011-10-02 MED ORDER — ASPIRIN 81 MG PO CHEW
81.0000 mg | CHEWABLE_TABLET | Freq: Every day | ORAL | Status: DC
  Administered 2011-10-03 – 2011-10-12 (×10): 81 mg via ORAL
  Filled 2011-10-02 (×9): qty 1

## 2011-10-02 MED ORDER — LIDOCAINE HCL (PF) 1 % IJ SOLN
INTRAMUSCULAR | Status: AC
  Filled 2011-10-02: qty 30

## 2011-10-02 MED ORDER — HEPARIN (PORCINE) IN NACL 2-0.9 UNIT/ML-% IJ SOLN
INTRAMUSCULAR | Status: AC
  Filled 2011-10-02: qty 2000

## 2011-10-02 MED ORDER — FENTANYL CITRATE 0.05 MG/ML IJ SOLN
INTRAMUSCULAR | Status: AC
  Filled 2011-10-02: qty 2

## 2011-10-02 MED ORDER — SODIUM CHLORIDE 0.9 % IV SOLN: 1.0000 mL/kg/h | INTRAVENOUS | Status: DC

## 2011-10-02 MED ORDER — MIDAZOLAM HCL 2 MG/2ML IJ SOLN
INTRAMUSCULAR | Status: AC
  Filled 2011-10-02: qty 2

## 2011-10-02 MED ORDER — SODIUM CHLORIDE 0.9 % IV SOLN: 250.0000 mL | INTRAVENOUS | Status: DC

## 2011-10-02 MED ORDER — SODIUM CHLORIDE 0.9 % IV SOLN
0.2500 mg/kg/h | INTRAVENOUS | Status: DC
  Filled 2011-10-02: qty 250

## 2011-10-02 MED ORDER — ACETAMINOPHEN 325 MG PO TABS: 650.0000 mg | ORAL_TABLET | ORAL | Status: DC | PRN

## 2011-10-02 MED ORDER — CEFAZOLIN SODIUM 1-5 GM-% IV SOLN
INTRAVENOUS | Status: AC
  Filled 2011-10-02: qty 50

## 2011-10-02 MED ORDER — CLOPIDOGREL BISULFATE 75 MG PO TABS
75.0000 mg | ORAL_TABLET | Freq: Every day | ORAL | Status: DC
  Administered 2011-10-03 – 2011-10-12 (×10): 75 mg via ORAL
  Filled 2011-10-02 (×13): qty 1

## 2011-10-02 MED ORDER — CLOPIDOGREL BISULFATE 300 MG PO TABS
ORAL_TABLET | ORAL | Status: AC
  Administered 2011-10-03: 75 mg via ORAL
  Filled 2011-10-02: qty 2

## 2011-10-02 MED ORDER — ONDANSETRON HCL 4 MG/2ML IJ SOLN: 4.0000 mg | Freq: Four times a day (QID) | INTRAMUSCULAR | Status: DC | PRN

## 2011-10-02 MED ORDER — MORPHINE SULFATE 2 MG/ML IJ SOLN
2.0000 mg | INTRAMUSCULAR | Status: DC | PRN
  Administered 2011-10-03 – 2011-10-12 (×17): 2 mg via INTRAVENOUS
  Filled 2011-10-02 (×18): qty 1

## 2011-10-02 MED ORDER — SODIUM CHLORIDE 0.9 % IJ SOLN: 3.0000 mL | INTRAMUSCULAR | Status: DC | PRN

## 2011-10-02 MED ORDER — NITROGLYCERIN 0.2 MG/ML ON CALL CATH LAB
INTRAVENOUS | Status: AC
  Filled 2011-10-02: qty 1

## 2011-10-02 NOTE — Progress Notes (Signed)
Pt transferred from [facility] after card cath. Pt from [facility] pta to [employer]. sw ref made. Ur ins review done. Will moniter for dc needs as pt progresses.

## 2011-10-02 NOTE — Progress Notes (Signed)
Patient complaining of chest pain 8/10 at 6:35 this AM. Nitro was increased to 76mcg/min and pt stated full relief of chest pain.

## 2011-10-02 NOTE — Progress Notes (Signed)
[facility] PROGRESS NOTE Subjective Pt doing well w/o futher bleeding . To [facility] for cath today  Objective: Vital signs in last 24 hours: Temp:  [97.5 F (36.4 C)-98.4 F (36.9 C)] 97.5 F (36.4 C) (05/31 0800) Pulse Rate:  [76-110] 100  (05/31 0915) Resp:  [14-29] 23  (05/31 0915) BP: (97-132)/(49-75) 102/65 mmHg (05/31 0915) SpO2:  [97 %-100 %] 99 % (05/31 0915) Weight:  [60.2 kg (132 lb 11.5 oz)] 60.2 kg (132 lb 11.5 oz) (05/31 0040) Last BM Date: 10/01/11  Intake/Output from previous day: 05/30 0701 - 05/31 0700 In: 1375.1 [P.O.:970; I.V.:405.1] Out: 810 [Urine:810] Intake/Output this shift: Total I/O In: 113.7 [I.V.:113.7] Out: 150 [Urine:150]  PE:  Lab Results:  Basename 10/02/11 0815 10/01/11 0325 09/30/11 0235 09/29/11 1800  WBC 7.2 7.5 8.2 --  HGB 9.8* 9.3* 9.1* 9.3*  HCT 29.7* 27.8* 26.3* 27.2*  PLT 228 185 167 --   BMET  Basename 09/30/11 0235  NA 137  K 3.8  CL 105  CO2 23  CREATININE 1.04   LFT No results found for this basename: PROT:3ALBUMIN:3,AST:3,ALT:3,ALKPHOS:3,BILITOT:3,BILIDIR:3,IBILI:3 in the last 72 hours PT/INR No results found for this basename: LABPROT:3,INR:3 in the last 72 hours PANCREAS No results found for this basename: LIPASE:3 in the last 72 hours       Studies/Results: No results found.  Medications: I have reviewed the patient's current medications.  Assessment/Plan: 1. GI Bleed.  Stable at present on PPI therapy. Probably due to esophagitis/gastrtis in anticoagulated pt. Unstable angina continues. For CATH today.   Garlene Apperson JR,Annslee Tercero L 10/02/2011, 10:03 AM

## 2011-10-02 NOTE — Progress Notes (Signed)
RFA sheath removed without complications. Pressure to site for greater than 20 min. Site level zero. Dressing applied, pt teaching done. Bedrest began at 1600.

## 2011-10-02 NOTE — Plan of Care (Signed)
Problem: Phase II Progression Outcomes Goal: Cath/PCI Day Path if indicated Outcome: Progressing Patient transferring to [facility] for a cath this AM

## 2011-10-02 NOTE — Brief Op Note (Signed)
09/26/2011 - 10/02/2011  1:46 PM  PATIENT:  [name]  78 y.o. male with known PAD, recent CVA, DVT/PE admitted with GIBld & NSTEMI. Persistent Angina,therefore after long wait for stable Hgb after 5 Units PRBC - referred fro Cardiac Catheterization +/- PCI with BMS.  PRE-OPERATIVE DIAGNOSIS:  chest pain; NSTEMI withGIBleed  POST-OPERATIVE DIAGNOSIS:    Severe 2 Vessel CAD with early Mid LAD 95% focal lesion followed by 60-70% irregular stenoses just after bifurcating SP1 and AV Groove Cx-OM1/OM2 bifurcation 90% lesion  S/p LAD  PCI with 2 overlapping Integrity BMS 2.5 mm stents (18 mm - 8 mm) post-dilated in mid stent to 2.65 mm.  S/p Bifurcation OM1-OM2 PCI with an Integrity BMS 2.5 mm x 15 mm - post dilated to 2.59mm  Normal LVEDP  Moderate mid CA & D1 disease.  PROCEDURE:  Procedure(s) (LRB):  LEFT HEART CATHETERIZATION WITH CORONARY ANGIOGRAM (N/A)  PCI of mid LAD -- 2 overlapping Integrity BMS 2.5 mm stents (18 mm - 8 mm) post-dilated in mid stent to 2.65 mm.  Complex PCI of AVGroove Circumflex-OM1/OM2 Bifurcation -  Integrity BMS 2.5 mm x 15 mm - post dilated to 2.6mm.  Direct Ultrasound Guided RFA access (picture documented in chart)  SURGEON:  Surgeon(s) and Role:    * Marykay Lex, MD - Primary  PHYSICIAN ASSISTANT:   ANESTHESIA:   local Lidocaine - 18 ml; IV Versed 3mg ; IV Fentanyl 125 mcg    EBL: <20 ml  Total I/O In: 163.7 [I.V.:163.7] Out: 150 [Urine:150]   MEDICATIONS USED:  Angiomax Bolus & gtt - stopped after completion of PCI  IC NTG x 5  Plavix 600mg   SL NTG x 1  IV NTG gtt  EQUIPMENT:    Diagnostic: Micropunctre (US guided Access) - 5 Fr sheath; JL4, JR4, (used to cross AoV for hemodynamics)  PCI: Guide XB 3.5 - Wire Prowater (LAD & OM2); Cougar OM1  DICTATION: .Note written in EPIC  PLAN OF CARE: Admit to inpatient  - to CCU for monitoring; Monitor Hgb level.  PATIENT DISPOSITION:  PACU - hemodynamically stable.   Delay  start of Pharmacological VTE agent (>24hrs) due to surgical blood loss or risk of bleeding: not applicable; SCDs used

## 2011-10-02 NOTE — Progress Notes (Signed)
Subjective:  No CP while on iv NTG - when NTG gtt stopped temporarily - CP & SOB returned.  Objective:  Temp:  [98 F (36.7 C)-98.4 F (36.9 C)] 98 F (36.7 C) (05/31 0000) Pulse Rate:  [76-108] 102  (05/31 0750) Resp:  [14-29] 26  (05/31 0750) BP: (98-132)/(55-75) 109/65 mmHg (05/31 0750) SpO2:  [97 %-100 %] 99 % (05/31 0750) Weight:  [60.2 kg (132 lb 11.5 oz)] 60.2 kg (132 lb 11.5 oz) (05/31 0040) Weight change: -0.5 kg (-1 lb 1.6 oz)  Intake/Output from previous day: 05/30 0701 - 05/31 0700 In: 1375.1 [P.O.:970; I.V.:405.1] Out: 810 [Urine:810]  Intake/Output from this shift: Total I/O In: 5.1 [I.V.:5.1] Out: -   Physical Exam: General appearance: alert and cooperative Neck: no adenopathy, no carotid bruit, no JVD, supple, symmetrical, trachea midline and thyroid not enlarged, symmetric, no tenderness/mass/nodules Lungs: clear to auscultation bilaterally Heart: regular rate and rhythm, S1, S2 normal, no murmur, click, rub or gallop Extremities: extremities normal, atraumatic, no cyanosis or edema  Lab Results: Results for orders placed during the hospital encounter of 09/26/11 (from the past 48 hour(s))  CARDIAC PANEL(CRET KIN+CKTOT+MB+TROPI)     Status: Abnormal   Collection Time   09/30/11  9:35 AM      Component Value Range Comment   Total CK 33  7 - 232 (U/L)    CK, MB 2.2  0.3 - 4.0 (ng/mL)    Troponin I 0.37 (*) <0.30 (ng/mL)    Relative Index RELATIVE INDEX IS INVALID  0.0 - 2.5    TROPONIN I     Status: Abnormal   Collection Time   10/01/11  3:25 AM      Component Value Range Comment   Troponin I 0.45 (*) <0.30 (ng/mL)   CBC     Status: Abnormal   Collection Time   10/01/11  3:25 AM      Component Value Range Comment   WBC 7.5  4.0 - 10.5 (K/uL)    RBC 3.21 (*) 4.22 - 5.81 (MIL/uL)    Hemoglobin 9.3 (*) 13.0 - 17.0 (g/dL)    HCT 16.1 (*) 09.6 - 52.0 (%)    MCV 86.6  78.0 - 100.0 (fL)    MCH 29.0  26.0 - 34.0 (pg)    MCHC 33.5  30.0 - 36.0 (g/dL)    RDW 04.5  40.9 - 81.1 (%)    Platelets 185  150 - 400 (K/uL)   DIFFERENTIAL     Status: Abnormal   Collection Time   10/01/11  3:25 AM      Component Value Range Comment   Neutrophils Relative 64  43 - 77 (%)    Neutro Abs 4.8  1.7 - 7.7 (K/uL)    Lymphocytes Relative 22  12 - 46 (%)    Lymphs Abs 1.7  0.7 - 4.0 (K/uL)    Monocytes Relative 7  3 - 12 (%)    Monocytes Absolute 0.5  0.1 - 1.0 (K/uL)    Eosinophils Relative 7 (*) 0 - 5 (%)    Eosinophils Absolute 0.5  0.0 - 0.7 (K/uL)    Basophils Relative 0  0 - 1 (%)    Basophils Absolute 0.0  0.0 - 0.1 (K/uL)     Imaging: Imaging results have been reviewed  Assessment/Plan:  Principal Problem:  *GI bleed, recurrent, on Coumadin- hgb 7.0 on admission Active Problems:   NSTEMI (non-ST elevated myocardial infarction)   Cardiomyopathy, EF 40-50% 2D 09/27/11  Unstable angina   Anemia, transfused this admission.   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 Admitted with USA/ACS and NSTEMI. EKG shows ST with diffuse ST depression. Trop + 1.2. Pt says that when IV NTG is off the CP returns. He has had a GIB as well.   Based upon his continued CP - relieved by NTG, he does cath to define anatomy.  Unfortunately, no CBC ordered for today yet.  STAT CBC ordered -- as long as Hgb/Hct stable - will plan to proceed with Cath vi femoral approach (to avoid need for radial cocktail & IV Heparin if no PCI needed).   Difficult decisions if PCI warranted --  would most likely opt for BMS over DES in order to obviate the need for mandatory Plavix long term given his GI Bld Hx as well as DVT/PE & need for full A/C.  IVC filter was mentioned, which may still be a viable option at least in the short term.  If PCI -will recover @ [facility] Continue NTG gtt & BB pre-cath.  Procedure:  LEFT HEART CATHETERIZATION WITH CORONARY ANGIOGRAPHY & POSSIBLE PERCUTANEOUS CORONARY ANGIOGRAPHY.  The procedure with Risks/Benefits/Alternatives and Indications was reviewed with the patient.  All questions were answered.    Risks / Complications include, but not limited to: Death, MI, CVA/TIA, VF/VT (with defibrillation), Bradycardia (need for temporary pacer placement), contrast induced nephropathy, bleeding / bruising / hematoma / pseudoaneurysm, vascular or coronary injury (with possible emergent CT or Vascular Surgery), adverse medication reactions, infection. Recurrent GI Bleed.   The patient voices understanding and agree to proceed.   I have signed the consent form and placed it on the chart for patient signature and RN witness.     Marykay Lex, M.D., M.S. [facility] [address] [place], [place]  [address]  [phone]  10/02/2011 8:14 AM

## 2011-10-02 NOTE — Plan of Care (Signed)
Problem: Phase III Progression Outcomes Goal: No anginal pain Outcome: Progressing No pain on nitro gtt at 8mcg/min Goal: Tolerating diet Outcome: Progressing Full liquid

## 2011-10-02 NOTE — Progress Notes (Signed)
Right groin shaved, EKG completed, Cardiac cath instructional video playing for patient to watch now. Pt is awake and compliant with watching video.

## 2011-10-02 NOTE — Progress Notes (Signed)
Subjective: Patient seen this morning on rounds, no problems, no bleeding no chest pain. No problems per nursing. Still putting well with condom catheter  Objective: Vital signs in last 24 hours: Temp:  [97.5 F (36.4 C)-98.4 F (36.9 C)] 97.5 F (36.4 C) (05/31 0800) Pulse Rate:  [76-110] 87  (05/31 0950) Resp:  [14-29] 21  (05/31 0950) BP: (97-132)/(49-75) 115/58 mmHg (05/31 0950) SpO2:  [97 %-100 %] 99 % (05/31 0950) Weight:  [60.2 kg (132 lb 11.5 oz)] 60.2 kg (132 lb 11.5 oz) (05/31 0040) Weight change: -0.5 kg (-1 lb 1.6 oz) Last BM Date: 10/01/11  Intake/Output from previous day: 05/30 0701 - 05/31 0700 In: 1375.1 [P.O.:970; I.V.:405.1] Out: 810 [Urine:810] Intake/Output this shift: Total I/O In: 163.7 [I.V.:163.7] Out: 150 [Urine:150]  General appearance: alert and cooperative Resp: clear to auscultation bilaterally Cardio: regular rate and rhythm, S1, S2 normal, no murmur, click, rub or gallop  Lab Results:  Results for orders placed during the hospital encounter of 09/26/11 (from the past 24 hour(s))  CBC     Status: Abnormal   Collection Time   10/02/11  8:15 AM      Component Value Range   WBC 7.2  4.0 - 10.5 (K/uL)   RBC 3.40 (*) 4.22 - 5.81 (MIL/uL)   Hemoglobin 9.8 (*) 13.0 - 17.0 (g/dL)   HCT 16.1 (*) 09.6 - 52.0 (%)   MCV 87.4  78.0 - 100.0 (fL)   MCH 28.8  26.0 - 34.0 (pg)   MCHC 33.0  30.0 - 36.0 (g/dL)   RDW 04.5 (*) 40.9 - 15.5 (%)   Platelets 228  150 - 400 (K/uL)      Studies/Results: No results found.  Medications:  Prior to Admission:  Prescriptions prior to admission  Medication Sig Dispense Refill  . clopidogrel (PLAVIX) 75 MG tablet Take 75 mg by mouth daily.      Marland Kitchen diltiazem (DILACOR XR) 180 MG 24 hr capsule Take 180 mg by mouth daily.      Marland Kitchen lactose free nutrition (BOOST PLUS) LIQD Take 237 mLs by mouth 3 (three) times daily with meals.      . metoCLOPramide (REGLAN) 10 MG tablet Take 10 mg by mouth 3 (three) times daily.      .  metoprolol (LOPRESSOR) 50 MG tablet Take 1 tablet (50 mg total) by mouth 2 (two) times daily.      . Multiple Vitamin (MULITIVITAMIN WITH MINERALS) TABS Take 1 tablet by mouth daily.      . NON FORMULARY 30 mLs 2 (two) times daily. Provide Gold Regular Liquid - for wound healing.      . pantoprazole (PROTONIX) 40 MG tablet Take 40 mg by mouth daily.      . simvastatin (ZOCOR) 10 MG tablet Take 10 mg by mouth daily.      . Tamsulosin HCl (FLOMAX) 0.4 MG CAPS Take 0.4 mg by mouth daily.      . vitamin C (ASCORBIC ACID) 500 MG tablet Take 500 mg by mouth 2 (two) times daily.      Marland Kitchen warfarin (COUMADIN) 10 MG tablet Take 10 mg by mouth daily.      . metoCLOPramide (REGLAN) 10 MG tablet Take 1 tablet (10 mg total) by mouth 3 (three) times daily before meals.       Scheduled:   . antiseptic oral rinse  15 mL Mouth Rinse q12n4p  . aspirin  324 mg Oral Pre-Cath  . bivalirudin      .  chlorhexidine  15 mL Mouth Rinse BID  . clopidogrel      . fentaNYL      . fentaNYL      . heparin      . lactose free nutrition  237 mL Oral BID BM  . lidocaine      . metoprolol  2.5 mg Intravenous Once  . metoprolol tartrate  50 mg Oral TID  . midazolam      . midazolam      . mupirocin ointment  1 application Nasal BID  . nitroGLYCERIN      . pantoprazole  40 mg Oral BID AC  . polyethylene glycol  17 g Oral Daily  . simvastatin  10 mg Oral q1800  . sodium chloride  3 mL Intravenous Q12H   Continuous:   . sodium chloride 50 mL (10/02/11 0841)  . bivalirudin (ANGIOMAX) infusion 5 mg/mL (Cath Lab,ACS,PCI indication)    . nitroGLYCERIN Stopped (10/02/11 1014)    Assessment/Plan: Principal Problem:  *GI bleed, recurrent, on Coumadin- hgb 7.0 on admission Active Problems:  PE (pulmonary embolism) July 2012  Chronic anticoagulation  Fracture of hip, right, surg -Feb 2013  PVD, L-R FFBPG '01, RCIA PTA 3/09, REIA PTA 8/11  SBO (small bowel obstruction), lysis of adhesions Feb 2013  NSTEMI (non-ST  elevated myocardial infarction)  DVT (deep venous thrombosis)  CVA, 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 *   Currently patient doing well, await heart catheterization results. LOS: 6 days   Kennie Snedden D 10/02/2011, 1:08 PM

## 2011-10-03 ENCOUNTER — Inpatient Hospital Stay (HOSPITAL_COMMUNITY): Payer: Medicare HMO

## 2011-10-03 ENCOUNTER — Encounter (HOSPITAL_COMMUNITY): Payer: Self-pay | Admitting: Cardiology

## 2011-10-03 DIAGNOSIS — N401 Enlarged prostate with lower urinary tract symptoms: Secondary | ICD-10-CM | POA: Diagnosis present

## 2011-10-03 DIAGNOSIS — R338 Other retention of urine: Secondary | ICD-10-CM | POA: Diagnosis present

## 2011-10-03 LAB — BASIC METABOLIC PANEL
BUN: 8 mg/dL (ref 6–23)
Calcium: 8.7 mg/dL (ref 8.4–10.5)
Creatinine, Ser: 1.04 mg/dL (ref 0.50–1.35)
GFR calc non Af Amer: 68 mL/min — ABNORMAL LOW (ref >90)
Glucose, Bld: 101 mg/dL — ABNORMAL HIGH (ref 70–99)
Sodium: 134 mEq/L — ABNORMAL LOW (ref 135–145)

## 2011-10-03 LAB — CBC
HCT: 28.8 % — ABNORMAL LOW (ref 39.0–52.0)
Hemoglobin: 9.6 g/dL — ABNORMAL LOW (ref 13.0–17.0)
MCH: 28.6 pg (ref 26.0–34.0)
MCHC: 33.3 g/dL (ref 30.0–36.0)
MCV: 85.7 fL (ref 78.0–100.0)

## 2011-10-03 MED ORDER — ISOSORBIDE MONONITRATE ER 30 MG PO TB24
30.0000 mg | ORAL_TABLET | Freq: Every day | ORAL | Status: DC
  Administered 2011-10-03 – 2011-10-12 (×10): 30 mg via ORAL
  Filled 2011-10-03 (×10): qty 1

## 2011-10-03 MED ORDER — IOHEXOL 300 MG/ML  SOLN
100.0000 mL | Freq: Once | INTRAMUSCULAR | Status: AC | PRN
  Administered 2011-10-03: 50 mL via INTRAVENOUS

## 2011-10-03 MED ORDER — METOPROLOL TARTRATE 100 MG PO TABS
100.0000 mg | ORAL_TABLET | Freq: Two times a day (BID) | ORAL | Status: DC
  Administered 2011-10-03 – 2011-10-12 (×18): 100 mg via ORAL
  Filled 2011-10-03 (×20): qty 1

## 2011-10-03 NOTE — CV Procedure (Signed)
[facility]     CARDIAC CATHETERIZATION REPORT  NAME:  [name]     MRN: [id number] DATE OF BIRTH:  Aug 13, 1933   ADMIT DATE:  09/26/2011  Performing Cardiologist: Marykay Lex, M.D., M.S. Primary Physician: Katy Apo, MD, MD Primary Cardiologist:  Nanetta Batty, MD.  Procedures Performed:  Left Heart Catheterization via 5 Fr Right Femoral Artery (RRFA) access  Ultrasound Guided RFA access using Micropuncture  Native Coronary Angiography  IC NTG Injection 200 mcg x 5  Percutaneous Coronary Artery Intervention on early Mid LAD with 2 overlapping Integrity BMS -- 2.5 mm x 18 mm & 2.5 mm x 8 mm (placed for significant distal edge "stepdown / dissection); final diameter 2.55 mm distal, 2.65 mid & proximal  Complex Percutaneous Coronary Artery Intervention on distal AVGroove Left Circumflex into OM2 (at the OM1/OM2 bifurcation) a Integrity BMS 2.5 mm x 14 mm stent; post dilated to 2.35mm  Indication(s): NSTEMI (in the setting of GI Bleed) with persistent post-infarct Angina  Severe PAD - s/p R-L Fem-Fem Bypass  DM-2  HLD  HTN  History: 78 y.o. male with a past medical history significant for PVD. He has been followed by Dr [name] for several years. We appreciate Dr [name] notification. Mr [name] has had prior L-R FFBPG 2001. He had subsequent RLE PTA in 3/09 and 8/11. He had a pulmonary embolism in July 2012. He also had DVT and was placed on Coumadin. We last saw him in Nov 2012. He has not had prior cardiac history. He was admitted in Feb 2013 after a fall and suffered a fx hip requiring surgery. That hospitalization was complicated by SBO, s/p lysis of adhesions, Rt brain CVA with Lt hemiparesis, GI bleeding, and acute renal insufficiency. He was discharged to a rehab facility. He was put back on Coumadin after he stabilized. He is admitted now with GI bleed- Hgb 7.0 on admission. There is a history of melana. He also has had some SSCP and his  Troponin is positive - 1.68. His pain is better with NTG. His EKG shows no acute changes. He continues to have SSCP now with his baseline sinus tachycardia that is responsive to NTG & BB.  His Hgb has remained stable now for 3 days following the 5th unit of PRBC transfusion.  He remains symptomatic noting chest "burning" and dyspnea if the NTG gtt has discontinued.  Consent: The procedure with Risks/Benefits/Alternatives and Indications was reviewed with the patient and family.  All questions were answered.    Risks / Complications include, but not limited to: Death, MI, CVA/TIA, VF/VT (with defibrillation), Bradycardia (need for temporary pacer placement), contrast induced nephropathy, bleeding / bruising / hematoma / pseudoaneurysm, vascular or coronary injury (with possible emergent CT or Vascular Surgery), adverse medication reactions, infection.  The increased concern for Bleeding (GI bleeding in light of recent GI bleed if anticoagulation is needed for PCI) was discussed in detail.  The patient and family voice understanding and agree to proceed.    Consent for signed by MD and patient with RN witness -- placed on chart.  Procedure: The patient was brought to the 2nd Floor [facility] in the fasting state and prepped and draped in the usual sterile fashion for Right groin access - radial access was not chosen to avoid excess anticoagulation. Sterile technique was used including antiseptics, cap, gloves, gown, hand hygiene, mask and sheet.  Skin prep: Chlorhexidine;  Time Out: Verified patient identification, verified procedure, site/side  was marked, verified correct patient position, special equipment/implants available, medications/allergies/relevent history reviewed, required imaging and test results available.  Performed  The right femoral head was identified using tactile and fluoroscopic technique.  The right groin was anesthetized with 1% subcutaneous Lidocaine.   After 2 attempts at fluoroscopically guided access with excellent blood return, but inability to pass the wire - manual pressure was held for a total of 5 minute to ensure adequate hemostasis.  The decision was made to use Direct Ultrasound Guidance with a Micropuncture needle.   With Ultrasound Guidance, the right Common Femoral Artery was accessed using the Modified Seldinger Technique with Micropuncture access kit.  An antimicrobial bonded/coated single lumen (5 Fr) sheath.  The sheath was aspirated and flushed.    A 5 Fr JL4 followed by a JR4 Catheter was advanced of over a Versicore wire into the ascending Aorta and used to engage the Left then Right Coronary Artery.  Multiple cineangiographic views of the Both Coronary Artery system(s) were performed.  The JR4 catheter was then advanced across the Aortic Valve.  LV hemodynamics were measured, and the catheter was pulled back across the Aortic Valve for measurement of "pull-back" gradient.  The catheter was removed completely out of the body.  Hemodynamics:  Central Aortic Pressure / Mean Aortic Pressure: 124/63 mmHg; 86 mmHg  LV Pressure / LV End diastolic Pressure:  127/5 mmHg; 8 mmHg  Left Ventriculography: Not done  Coronary Angiographic Data:  Left Main:  Large caliber, bifurcates into LAD and Left Circumflex, with small Ramus Branch; ~20% distal stenosis  Left Anterior Descending (LAD):  Moderate caliber vessel with diffuse calcification proximally with minimal stenosis until the early mid vessel just after SP1 and D1, there is a focal 95-99% stenosis that tapers back up to a ~60% lesion in a long irregular segment.  The vessel tapers distally giving off a final apical diagonal branch before wrapping around the apex.  1st diagonal (D1):  Small to moderate caliber vessel, proximal ~40-50% irregular stenosis.  2nd diagonal (D2):  Very small caliber distal branch just prior to the apex.  Circumflex (LCx):  Large caliber tortuous vessel,  proximal small OM (not numbered); there is a mid vessel OM1 just prior to coursing into the AV-Groove; AV Groove vessel has a ~90-95% focal stenosis just prior to the terminal bifurcation into OM2 and OM3.  1st obtuse marginal:  Small to moderate caliber vessel, courses along lateral wall; angiographically normal  2nd obtuse marginal:  Moderate caliber vessel (actually the larges of the 3), courses along the inferolateral wall almost to the apex with minimal luminal irregularities. 3rd obtuse marginal / posterior lateral branch:  Moderate caliber vessel , courses along the inferior wall;  with minimal luminal irregularities.   Ramus Intermedius:  Very small, relatively insignificant vessel.  Right Coronary Artery: Large caliber, dominant vessel; ~40-50% tubular eccentric lesion between 2 RV Marginal Branches, then mild luminal irregularities distally before bifurcating into the RPL system and RPDA.  Rosterior Descending Artery (RPDA): Moderate caliber vessel with severe septal branches, reaches almost to the apex; angiographically normal with the exception of a ~40% mid vessel lesion.  Posterior Lateral System:  Small caliber Posterior-AVGroove branch that gives off 2-3 very small RPL branches; minimal luminal irregularities.  PERCUTANEOUS CORONARY INTERVENTION PROCEDURE  After reviewing the initial cineangiography images, the culprit lesion(s) was identified, and the decision was made to proceed with percutaneous coronary intervention.  I evaluated the risks vs. Benefits of 2 vessel PCI at the same setting  vs postponement to allow for CTSgx consultation.  I discussed the situation with his primary Cardiologist, Dr. Jari Favre felt that will all of his co morbidities, he would not be a very inviting CABG candidate.  We decided that I should proceed with 2 vessel PCI using BMS stents to alleviate his persistent NTG responsive angina,   The 5Fr Sheath was exchanged over a wire for a 6Fr sheath.   A  weight based bolus of IV Angiomax was administered and the drip was continued until completion of the procedure.   Oral Clopidogrel 600 mg was administered.  The Guide catheter(s) were advanced over a J-wire and used to engage the left Coronary Artery.  An ACT of > 200 Sec was confirmed prior to advancing the Guidewire.  Lesion #1:  Early Mid LAD  Pre-PCI Stenosis: 95-99 % Post-PCI Stenosis: 0 %     TIMI 3 flow       TIMI 3 flow  Guide Catheter: 6Fr XB 3.5  Guidewire: Prowater  Pre-Dilitation Balloon: Emerge MR 2.0 mm x 12 mm  1st Inflation: 8 Atm for 17 Sec  2nd Inflation: 8 Atm for 46 Sec  Scout angiography did not reveal evidence of dissection or perforation  Stent #1: Integrity BMS 2.5 mm x 18 mm  Deployment: 10 Atm for 35 Sec Scout angiography did not reveal evidence of dissection or perforation, but there was a suspicion of a distal step-down. (the Post-dilation balloon was actually inflated in the mid stent - as inflation #1 below); Following this inflation, angiography in different projections, after NTG injection, demonstrated a possible short distal edge dissection, warranting a 2nd short stent placement.  Stent #2: Integrity BMS 2.5 mm x 8 mm  Deployment: 9 Atm for 46 Sec - distal diameter 2.55 mm  2nd Inflation: 12 Atm for 51 Sec (at the overlap site)  Post-Dilitation Balloon: [place] Sprinter 2.5 mm x 12 mm  1st Inflation: 13 Atm for 31 Sec (done before Stent 2)  2nd Inflation: 16 Atm for 45 Sec - at distal Stent 1 & overlap; final diameter 2.65 mm   Post-deployment cineangiography with and without guidewire in place was performed in multiple orthogonal views demonstrating excellent stent deployment and did not reveal evidence of dissection or perforation.  There is a "step-up" at the proximal edge of the stent, but no dissection.  Lesion # 2:  Distal AV-Groove Circumflex at OM2/OM3 Bifurcation.    Pre-PCI Stenosis: 90-95 % Post-PCI Stenosis: 0 %     TIMI 3 flow        TIMI 3 flow  Guidewire OM2: Prowater  Guidewire OM3: Cougar  Both OMs wired - very difficult to wire these distal branches in a tortuous vessel.  Pre-Dilitation Balloon: Emerge MR 2.0 mm x 12 mm - over OM3 wire (due to more convenient angle)  1st Inflation:  8 Atm for 30 Sec Scout angiography did not reveal evidence of dissection or perforation  Stent: Integrity BMS  2.5 mm x 14mm  Deployment:  9 Atm for 30 Sec  2nd Inflation - Post-dilation: 11 Atm for 45 Sec; final distal diameter in OM3 - 2.65   Post-deployment cineangiography with and without guidewire in place was performed in multiple orthogonal views demonstrating excellent stent deployment and did not reveal evidence of dissection or perforation, also no impingement on OM2 was noted with brisk TIMI 3 flow in both OM vessels.   The sheath was sutured in place to be removed 2 hrs after Angiomax is discontinued.  The patient was transported to the PACU holding area in a chest pain free, hemodynamically stable condition.    The patient  was stable before, during and following the procedure.    Patient did tolerate procedure well.  There were not complications.  The sterile field was violated by the patient's hand.  IV Ancef was administered and the site was re-prepped. EBL: <40ml    Medications:  Sedation:  3 mg IV Versed, 125 IV mcg Fentanyl  Contrast:  270 ml Omnipaque  Anticoagulation: Angiomax (1.75 mg/kg) bolus followed by drip (0.75 mg/kg/hr)  Plavix 600 mg  Ancef 1 gm - given when the patient's hand entered the access field.  The site was re-prepped.  IC Nitroglycerin 200 mcg x 5   Impression:  Successful 2 site / 2 vessel PCI of the mid LAD and AV-Groove Circumflex with Integrity BMS stents (2.5 mm, post-dilated to ~2.65 mm) with resolution of anginal pain.  Normal to low LVEDP  Moderate residual CAD in the RCA - not flow limiting  Plan:  Transfer to CCU, will continue NTG gtt overnight for possible post  PCI spasm & wean off by AM.  ASA & Plavix x at least 2 months -- will defer to Dr. Allyson [name] in outpatient setting.  Monitor for drop in Hgb given prolonged Angiomax & reloading with Plavix.  The case and results was discussed with the patient (and family). The case and results was discussed with the patient's primary team. The case and results was discussed with the patient's Cardiologist.  Time Spend Directly with Patient:  210 minutes  Kadeisha Betsch W, M.D., M.S. [facility] [address] [place], [place]  [address]  [phone]

## 2011-10-03 NOTE — Procedures (Signed)
Successful IVC filter placement(Cordis Trapease filter) No comp Stable Full report in pacs

## 2011-10-03 NOTE — Progress Notes (Signed)
Dr. Miles Costain notified pt c/o cramping left calf since he came back for procedure. Also, rt jug. Dressing oozing slightly, size of quarter.Pulses palpable bilaterally.  No new orders received.  Left foot slightly cool but no cooler than rt.  Color good but cap refill 3 sec.

## 2011-10-03 NOTE — H&P (Signed)
CC:  DVT with history of GI bleed on anti-coagulation.  Patient is for IVC filter placement.  This will be a permanent filter type.  HPI : Hx of PE on anti-coagulation, developed GI bleed.  Now with LE DVT in need of filter for protection.  See note below :  See note from MD this am : [name] Housekeeper, MD Physician Signed  Progress Notes 10/03/2011 9:15 AM  Subjective: S/p cath with stents Taper off nitro Plavix and ASA Off Coumadin IVC filter today possible with IR Cardiac stable BPH- obstruction relieve  With  Foley Feel better  Objective: Vital signs in last 24 hours: Temp:  [97.2 F (36.2 C)-98.6 F (37 C)] 97.8 F (36.6 C) (06/01 0800) Pulse Rate:  [78-127] 110  (06/01 0900) Resp:  [10-27] 22  (06/01 0900) BP: (92-155)/(58-83) 107/80 mmHg (06/01 0900) SpO2:  [98 %-100 %] 99 % (06/01 0900) Weight:  [61.2 kg (134 lb 14.7 oz)-61.4 kg (135 lb 5.8 oz)] 61.2 kg (134 lb 14.7 oz) (06/01 0500) Weight change: 1.2 kg (2 lb 10.3 oz) Last BM Date: 10/01/11  Intake/Output from previous day: 05/31 0701 - 06/01 0700 In: 952.7 [P.O.:120; I.V.:832.7] Out: 2040 [Urine:2040] Intake/Output this shift: Total I/O In: 32 [I.V.:32] Out: -   General appearance: alert Resp: clear to auscultation bilaterally Cardio: tacycardia GI: soft, non-tender; bowel sounds normal; no masses,  no organomegaly  Lab Results:  Basename  10/03/11 0524  10/02/11 0815   WBC  7.5  7.2   HGB  9.6*  9.8*   HCT  28.8*  29.7*   PLT  217  228    BMET  Basename  10/03/11 0524   NA  134*   K  4.0   CL  101   CO2  24   GLUCOSE  101*   BUN  8   CREATININE  1.04   CALCIUM  8.7     Studies/Results: No results found.  Medications: I have reviewed the patient's current medications.  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 Ok to tranfers to step down  LOS: 7 days   HUSAIN,KARRAR 10/03/2011, 9:15 AM    Results for [name], [name] (MRN [id number]) as of 10/03/2011 10:22  Ref. Range 09/28/2011 02:57  Prothrombin Time Latest Range: 11.6-15.2 seconds 15.8 (H)  INR Latest Range: 0.00-1.49  1.23    Assessment / Plan : Extensive talk with patient and spouse regarding reasons, benefits and potential complications including but not limited to infection, bleeding, vessel damage and potential complications with moderate sedation with their apparent understanding.  Patient is appropriate for a permanent IVC filter placement.  Written consent obtained.  Labs WNL  to proceed.

## 2011-10-03 NOTE — Progress Notes (Signed)
Subjective:  Did well o/n - low dose NTG gtt running o/n to avoid spasm related angina. Feels great this AM  - no Sx.  Objective:  Temp:  [97.2 F (36.2 C)-98.6 F (37 C)] 97.8 F (36.6 C) (06/01 0800) Pulse Rate:  [78-127] 127  (06/01 0800) Resp:  [10-27] 27  (06/01 0800) BP: (92-155)/(49-83) 92/67 mmHg (06/01 0800) SpO2:  [98 %-100 %] 100 % (06/01 0800) Weight:  [61.2 kg (134 lb 14.7 oz)-61.4 kg (135 lb 5.8 oz)] 61.2 kg (134 lb 14.7 oz) (06/01 0500) Weight change: 1.2 kg (2 lb 10.3 oz)  Intake/Output from previous day: 05/31 0701 - 06/01 0700 In: 952.7 [P.O.:120; I.V.:832.7] Out: 2040 [Urine:2040]  Intake/Output from this shift:    Physical Exam: General appearance: alert and cooperative; A&O x 3, NAD. Very pleasant mood & affect. Neck: no adenopathy, no carotid bruit, no JVD, supple, symmetrical, trachea midline and thyroid not enlarged, symmetric, no tenderness/mass/nodules Lungs: clear to auscultation bilaterally Heart: regular but tachycardic rate and rhythm, S1, S2 normal, no murmur, click, rub or gallop Extremities: extremities normal, atraumatic, no cyanosis or edema Groin site with minimal focal bruising.  Lab Results: Results for orders placed during the hospital encounter of 09/26/11 (from the past 48 hour(s))  CBC     Status: Abnormal   Collection Time   10/02/11  8:15 AM      Component Value Range Comment   WBC 7.2  4.0 - 10.5 (K/uL)    RBC 3.40 (*) 4.22 - 5.81 (MIL/uL)    Hemoglobin 9.8 (*) 13.0 - 17.0 (g/dL)    HCT 16.1 (*) 09.6 - 52.0 (%)    MCV 87.4  78.0 - 100.0 (fL)    MCH 28.8  26.0 - 34.0 (pg)    MCHC 33.0  30.0 - 36.0 (g/dL)    RDW 04.5 (*) 40.9 - 15.5 (%)    Platelets 228  150 - 400 (K/uL)   POCT ACTIVATED CLOTTING TIME     Status: Normal   Collection Time   10/02/11 12:16 PM      Component Value Range Comment   Activated Clotting Time 485     CBC     Status: Abnormal   Collection Time   10/03/11  5:24 AM      Component Value Range Comment     WBC 7.5  4.0 - 10.5 (K/uL)    RBC 3.36 (*) 4.22 - 5.81 (MIL/uL)    Hemoglobin 9.6 (*) 13.0 - 17.0 (g/dL)    HCT 81.1 (*) 91.4 - 52.0 (%)    MCV 85.7  78.0 - 100.0 (fL)    MCH 28.6  26.0 - 34.0 (pg)    MCHC 33.3  30.0 - 36.0 (g/dL)    RDW 78.2 (*) 95.6 - 15.5 (%)    Platelets 217  150 - 400 (K/uL)   BASIC METABOLIC PANEL     Status: Abnormal   Collection Time   10/03/11  5:24 AM      Component Value Range Comment   Sodium 134 (*) 135 - 145 (mEq/L)    Potassium 4.0  3.5 - 5.1 (mEq/L)    Chloride 101  96 - 112 (mEq/L)    CO2 24  19 - 32 (mEq/L)    Glucose, Bld 101 (*) 70 - 99 (mg/dL)    BUN 8  6 - 23 (mg/dL)    Creatinine, Ser 2.13  0.50 - 1.35 (mg/dL)    Calcium 8.7  8.4 - 10.5 (mg/dL)  GFR calc non Af Amer 68 (*) >90 (mL/min)    GFR calc Af Amer 78 (*) >90 (mL/min)    Cardiac Cath 10/01/11:  Severe 2 Vessel CAD with early Mid LAD 95% focal lesion followed by 60-70% irregular stenoses just after bifurcating SP1 and AV Groove Cx-OM1/OM2 bifurcation 90% lesion   S/p LAD PCI with 2 overlapping Integrity BMS 2.5 mm stents (18 mm - 8 mm) post-dilated in mid stent to 2.65 mm.   S/p Bifurcation OM1-OM2 PCI with an Integrity BMS 2.5 mm x 15 mm - post dilated to 2.52mm   Normal LVEDP   Moderate mid RCA & D1 disease.  Assessment/Plan:  Principal Problem:  *GI bleed, recurrent, on Coumadin- hgb 7.0 on admission Active Problems:   NSTEMI (non-ST elevated myocardial infarction)   Cardiomyopathy, EF 40-50% 2D 09/27/11   Unstable angina   Anemia, transfused this admission.   CAD (coronary artery disease)   Presence of bare metal stent in LAD coronary artery   Presence of bare metal stent in left circumflex coronary artery   Fracture of hip, right, surg -Feb 2013   SBO (small bowel obstruction), lysis of adhesions Feb 2013   History of GI bleed Feb 2013   History of renal insufficiency syndrome, 7/12 and 3/13- SCr Nl (1.0) now   PE (pulmonary embolism) July 2012    Chronic anticoagulation   PVD, L-R FFBPG '01, RCIA PTA 3/09, REIA PTA 8/11   DVT (deep venous thrombosis)   CVA, Rt brain Feb 2013   Time Spent Directly with Patient:  20 minutes  Length of Stay:  LOS: 7 days   Pt well known to [facility] -  PVOD but no previously documented CAD. Admitted with USA/ACS and NSTEMI. EKG shows ST with diffuse ST depression. Trop + 1.2. Pt says that when IV NTG is off the CP returns. He has had a GIB as well.   Based upon his continued CP - relieved by NTG, he was referred for cath to define his anatomy, and was found to have severe 2 Vessel CAD with mid LAD& distal LCx-OM (bifurcation) lesion - s/p Difficult 2 site PCI with BMS stents.  Minimal drop in Hgb level.  This AM he feels "great" - like a new man.     Will wean off NTG Gtt. Transfer to [employer].  As BP tolerates, titrate up BB dose given baseline sinus tachycardia  He is on ASA & Plavix - would like to avoid Warfarin for at least 1 month s/p GI Bleed   He should be "safe" now for GI MD to proceed with colonoscopy if deemed necessary (Hgb has been stable, even with full anticoagulation during PCI.  With recent Bilateral DVTs & history of PE - he is at high risk for recurrent VTE with his CVA & hip Sgx related limited mobility; I am in agreement with PCCM service that IVC filter may well be his best option -- this at least protects him for the fist few months of mandatory ASA/Plavix   Long term plans can be reconsidered in the upcoming months, but IVC filter is his best short term "fix" -- I have discussed with the primary service, who are in agreement with IVC filter.  Continue Statin  No BP room for ACE-I at this time - although ultimately with decreased EF - this would be beneficial.  Would anticipate some improvement in EF post PCI as LAD WMA was noted in March.  Marykay Lex, M.D., M.S. [facility] [address] [place], [place]   [address]  [phone]  10/03/2011 8:57 AM

## 2011-10-03 NOTE — Progress Notes (Signed)
Patient ID: [name], male   DOB: [birth year], 78 y.o.   MRN: [id number]  "I feel like a new person" States that he feels a lot better today than yesterday. S/P cath with stents.  Denies abdominal pain. No GI bleeding. Hgb 9.6.  Abd: soft, NT, ND, +BS   S/P GI bleed that has resolved. Follow closely since on anticoagulation. Continue PPI BID. On full liquids. Consider advancing diet tomorrow if tolerating full liquids.

## 2011-10-03 NOTE — Progress Notes (Signed)
Subjective: S/p cath with stents Taper off nitro Plavix and ASA Off Coumadin IVC filter today possible with IR Cardiac stable BPH- obstruction relieve  With  Foley Feel better  Objective: Vital signs in last 24 hours: Temp:  [97.2 F (36.2 C)-98.6 F (37 C)] 97.8 F (36.6 C) (06/01 0800) Pulse Rate:  [78-127] 110  (06/01 0900) Resp:  [10-27] 22  (06/01 0900) BP: (92-155)/(58-83) 107/80 mmHg (06/01 0900) SpO2:  [98 %-100 %] 99 % (06/01 0900) Weight:  [61.2 kg (134 lb 14.7 oz)-61.4 kg (135 lb 5.8 oz)] 61.2 kg (134 lb 14.7 oz) (06/01 0500) Weight change: 1.2 kg (2 lb 10.3 oz) Last BM Date: 10/01/11  Intake/Output from previous day: 05/31 0701 - 06/01 0700 In: 952.7 [P.O.:120; I.V.:832.7] Out: 2040 [Urine:2040] Intake/Output this shift: Total I/O In: 32 [I.V.:32] Out: -   General appearance: alert Resp: clear to auscultation bilaterally Cardio: tacycardia GI: soft, non-tender; bowel sounds normal; no masses,  no organomegaly  Lab Results:  Basename 10/03/11 0524 10/02/11 0815  WBC 7.5 7.2  HGB 9.6* 9.8*  HCT 28.8* 29.7*  PLT 217 228   BMET  Basename 10/03/11 0524  NA 134*  K 4.0  CL 101  CO2 24  GLUCOSE 101*  BUN 8  CREATININE 1.04  CALCIUM 8.7    Studies/Results: No results found.  Medications: I have reviewed the patient's current medications.  Assessment/Plan: Principal Problem:  *GI bleed, recurrent, off coumadin- HGB better- stable no more bleed Active Problems:  PE (pulmonary embolism) July 2012 - coumadin off - IVC filter No coumadin for now GI bleed recurrent Fracture of hip, right, surg -Feb 2013  PVD, L-R FFBPG '01, RCIA PTA 3/09, REIA PTA 8/11  SBO (small bowel obstruction), lysis of adhesions Feb 2013 -  GI ok NSTEMI (non-ST elevated myocardial infarction) - CATH- with STENTS; multi vessel disease- cardiology DVT (deep venous thrombosis) - IVC filter CVA, Rt brain Feb 2013  History of GI bleed Feb 2013 - recurrent- given Card issue-  hold off Colon eval  History of renal insufficiency syndrome, 7/12 and 3/13- SCr Nl (1.0) now  Cardiomyopathy, EF 40-50% 2D 09/27/11  Unstable angina - improved with stents- taper nitro  Anemia, transfused this admission. Ok to tranfers to step down   LOS: 7 days   Saja Bartolini 10/03/2011, 9:15 AM

## 2011-10-04 DIAGNOSIS — M79609 Pain in unspecified limb: Secondary | ICD-10-CM

## 2011-10-04 LAB — BASIC METABOLIC PANEL
BUN: 10 mg/dL (ref 6–23)
CO2: 23 mEq/L (ref 19–32)
Chloride: 103 mEq/L (ref 96–112)
Creatinine, Ser: 1.11 mg/dL (ref 0.50–1.35)

## 2011-10-04 LAB — CBC
HCT: 29 % — ABNORMAL LOW (ref 39.0–52.0)
Hemoglobin: 9.8 g/dL — ABNORMAL LOW (ref 13.0–17.0)
MCV: 84.8 fL (ref 78.0–100.0)
RBC: 3.42 MIL/uL — ABNORMAL LOW (ref 4.22–5.81)
WBC: 7.7 10*3/uL (ref 4.0–10.5)

## 2011-10-04 LAB — PREPARE RBC (CROSSMATCH)

## 2011-10-04 LAB — APTT: aPTT: 30 seconds (ref 24–37)

## 2011-10-04 LAB — ABO/RH: ABO/RH(D): A POS

## 2011-10-04 MED FILL — Dextrose Inj 5%: INTRAVENOUS | Qty: 50 | Status: AC

## 2011-10-04 NOTE — Progress Notes (Signed)
Subjective:  Did well o/n - Off NTG gtt as of yesterday AM. Tolerated IVC Filter well - oozing from IJ site (re-dressed several times) LLE cramping & cool.  Objective:  Temp:  [97.8 F (36.6 C)-98.8 F (37.1 C)] 98.3 F (36.8 C) (06/02 0816) Pulse Rate:  [89-119] 119  (06/02 0816) Resp:  [14-35] 18  (06/02 0816) BP: (104-149)/(60-87) 124/87 mmHg (06/02 0816) SpO2:  [91 %-100 %] 95 % (06/02 0816) Weight:  [61 kg (134 lb 7.7 oz)-608 kg (1340 lb 6.4 oz)] 61 kg (134 lb 7.7 oz) (06/02 0021) Weight change: 546.6 kg (1205 lb 0.6 oz)  Intake/Output from previous day: 06/01 0701 - 06/02 0700 In: 776.5 [P.O.:720; I.V.:56.5] Out: 900 [Urine:900]  Intake/Output from this shift:    Physical Exam: General appearance: alert and cooperative; A&O x 3, NAD. Very pleasant mood & affect. Neck: no adenopathy, no carotid bruit, no JVD, supple, symmetrical, trachea midline and thyroid not enlarged, symmetric, no tenderness/mass/nodules;  RIJ site (personally changed dressing - minimal hematoma / ooze now). Lungs: diffuse crackles - not rales or rhonchi. Non-labored. Heart: regular but tachycardic rate and rhythm, S1, S2 normal, no murmur, click, rub or gallop Extremities: extremities normal, atraumatic, no cyanosis or edema Groin site with minimal focal bruising. Difficult to assess DP/PT pulses on LE due to tachycardia, but palpable Popliteal pulse on L.  Lab Results: Results for orders placed during the hospital encounter of 09/26/11 (from the past 48 hour(s))  POCT ACTIVATED CLOTTING TIME     Status: Normal   Collection Time   10/02/11 12:16 PM      Component Value Range Comment   Activated Clotting Time 485     CBC     Status: Abnormal   Collection Time   10/03/11  5:24 AM      Component Value Range Comment   WBC 7.5  4.0 - 10.5 (K/uL)    RBC 3.36 (*) 4.22 - 5.81 (MIL/uL)    Hemoglobin 9.6 (*) 13.0 - 17.0 (g/dL)    HCT 16.1 (*) 09.6 - 52.0 (%)    MCV 85.7  78.0 - 100.0 (fL)    MCH 28.6   26.0 - 34.0 (pg)    MCHC 33.3  30.0 - 36.0 (g/dL)    RDW 04.5 (*) 40.9 - 15.5 (%)    Platelets 217  150 - 400 (K/uL)   BASIC METABOLIC PANEL     Status: Abnormal   Collection Time   10/03/11  5:24 AM      Component Value Range Comment   Sodium 134 (*) 135 - 145 (mEq/L)    Potassium 4.0  3.5 - 5.1 (mEq/L)    Chloride 101  96 - 112 (mEq/L)    CO2 24  19 - 32 (mEq/L)    Glucose, Bld 101 (*) 70 - 99 (mg/dL)    BUN 8  6 - 23 (mg/dL)    Creatinine, Ser 8.11  0.50 - 1.35 (mg/dL)    Calcium 8.7  8.4 - 10.5 (mg/dL)    GFR calc non Af Amer 68 (*) >90 (mL/min)    GFR calc Af Amer 78 (*) >90 (mL/min)   CBC     Status: Abnormal   Collection Time   10/04/11  4:15 AM      Component Value Range Comment   WBC 7.7  4.0 - 10.5 (K/uL)    RBC 3.42 (*) 4.22 - 5.81 (MIL/uL)    Hemoglobin 9.8 (*) 13.0 - 17.0 (g/dL)  HCT 29.0 (*) 39.0 - 52.0 (%)    MCV 84.8  78.0 - 100.0 (fL)    MCH 28.7  26.0 - 34.0 (pg)    MCHC 33.8  30.0 - 36.0 (g/dL)    RDW 16.1 (*) 09.6 - 15.5 (%)    Platelets 238  150 - 400 (K/uL)   BASIC METABOLIC PANEL     Status: Abnormal   Collection Time   10/04/11  4:15 AM      Component Value Range Comment   Sodium 136  135 - 145 (mEq/L)    Potassium 3.8  3.5 - 5.1 (mEq/L)    Chloride 103  96 - 112 (mEq/L)    CO2 23  19 - 32 (mEq/L)    Glucose, Bld 114 (*) 70 - 99 (mg/dL)    BUN 10  6 - 23 (mg/dL)    Creatinine, Ser 0.45  0.50 - 1.35 (mg/dL)    Calcium 9.0  8.4 - 10.5 (mg/dL)    GFR calc non Af Amer 63 (*) >90 (mL/min)    GFR calc Af Amer 73 (*) >90 (mL/min)    Cardiac Cath 10/01/11:  Severe 2 Vessel CAD with early Mid LAD 95% focal lesion followed by 60-70% irregular stenoses just after bifurcating SP1 and AV Groove Cx-OM1/OM2 bifurcation 90% lesion   S/p LAD PCI with 2 overlapping Integrity BMS 2.5 mm stents (18 mm - 8 mm) post-dilated in mid stent to 2.65 mm.   S/p Bifurcation OM1-OM2 PCI with an Integrity BMS 2.5 mm x 15 mm - post dilated to 2.64mm   Normal LVEDP   Moderate  mid RCA & D1 disease.  IVC Filter placed via RIJ yesterday -- had "oozing from IJ site o/n"  Assessment/Plan:  Principal Problem:  *GI bleed, recurrent, on Coumadin- hgb 7.0 on admission Active Problems:  NSTEMI (non-ST elevated myocardial infarction)  Cardiomyopathy, EF 40-50% 2D 09/27/11  Unstable angina  Anemia, transfused this admission.  CAD (coronary artery disease)  Presence of bare metal stent in LAD coronary artery  Presence of bare metal stent in left circumflex coronary artery  Fracture of hip, right, surg -Feb 2013  SBO (small bowel obstruction), lysis of adhesions Feb 2013  History of GI bleed Feb 2013  History of renal insufficiency syndrome, 7/12 and 3/13- SCr Nl (1.0) now  PE (pulmonary embolism) July 2012  Chronic anticoagulation  PVD, L-R FFBPG '01, RCIA PTA 3/09, REIA PTA 8/11  DVT (deep venous thrombosis)  CVA, Rt brain Feb 2013  BPH (benign prostatic hypertrophy) with urinary retention   Time Spent Directly with Patient:  20 minutes  Length of Stay:  LOS: 8 days   Pt well known to [facility] -  PVOD but no previously documented CAD. Admitted with USA/ACS and NSTEMI. EKG shows ST with diffuse ST depression. Trop + 1.2. Pt says that when IV NTG is off the CP returns. He has had a GIB as well.   Based upon his continued CP - relieved by NTG, he was referred for cath to define his anatomy, and was found to have severe 2 Vessel CAD with mid LAD& distal LCx-OM (bifurcation) lesion - s/p Difficult 2 site PCI with BMS stents.  Minimal drop in Hgb level. Off NTG x 24 hrs with no further Angina s/p PCI. Hgb stable despite PCI related Angiomax & Plavix (with no drop with 2 invasive vascular procedures).  This AM he feels "great" - like a new man.  Only concerns are - RIJ site &  L LE cool & cramping.   As BP tolerates, titrate up BB dose given baseline sinus tachycardia - increased dose to 100mg  bid.  He is on ASA & Plavix - would like to avoid Warfarin for at least 1  month s/p GI Bleed   He should be "safe" now for GI MD to proceed with colonoscopy if deemed necessary (Hgb has been stable, even with full anticoagulation during PCI.  With recent Bilateral DVTs & history of PE - s/p IVC Filter; will use SCDs for DVT prophylaxis.   Continue Statin  No BP room for ACE-I at this time, during titration BB dose- although ultimately with decreased EF this would be beneficial.  Would anticipate some improvement in EF post PCI as LAD WMA was noted in March.  LLE cramping is a bit worrisome given the R FA access -- ? If compression of the RFA sheath access site could have caused occlustion of Fem-Fem graft --> will order Arterial dopplers  Replace SCDs - but need to initiate PT/OT as he is no longer on drips & angina free; to start the process of d/c planning.    Marykay Lex, M.D., M.S. [facility] [address] [place], [place]  [address]  [phone]  10/04/2011 8:36 AM

## 2011-10-04 NOTE — Progress Notes (Signed)
Patient ID: [name], male   DOB: [birth year], 78 y.o.   MRN: [id number] [facility] Progress Note  [name] 78 y.o. 09/25/1933   Subjective: Feels good. Tolerating solid foods yesterday and today. Reports BM yesterday but unsure what it looked like. Hgb stable at 9.8.   Objective: Vital signs: Filed Vitals:   10/04/11 0956  BP: 119/89  Pulse: 120  Temp: 98.3  Resp: 18    Intake/Output last 24 hrs:  Intake/Output Summary (Last 24 hours) at 10/04/11 1102 Last data filed at 10/04/11 0956  Gross per 24 hour  Intake    603 ml  Output    900 ml  Net   -297 ml    Physical Exam: Gen: alert, no acute distress  Abd: soft, nontender, nondistended  Lab Results:  Basename 10/04/11 0415 10/03/11 0524  NA 136 134*  K 3.8 4.0  CL 103 101  CO2 23 24  GLUCOSE 114* 101*  BUN 10 8  CREATININE 1.11 1.04  CALCIUM 9.0 8.7  MG -- --  PHOS -- --   No results found for this basename: AST:2,ALT:2,ALKPHOS:2,BILITOT:2,PROT:2,ALBUMIN:2 in the last 72 hours  Basename 10/04/11 0415 10/03/11 0524  WBC 7.7 7.5  NEUTROABS -- --  HGB 9.8* 9.6*  HCT 29.0* 28.8*  MCV 84.8 85.7  PLT 238 217       Assessment/Plan: S/P GI bleed that has resolved. Tolerating diet. Continue PPI PO BID for 8 weeks and then take QD indefinitely. Will sign off. Call if questions.   Siria Calandro C. 10/04/2011, 11:02 AM

## 2011-10-04 NOTE — Progress Notes (Signed)
Called and made Dr. Herbie Baltimore aware of abnormal results from lower extremity doppler.  Received order for a duplex scan to be performed.

## 2011-10-04 NOTE — Consult Note (Signed)
Vascular Surgery Consultation  Reason for Consult: Thrombosed femoral-femoral bypass  HPI: [name] is a 78 y.o. male who presents for evaluation of thrombosed right-to-left femoral-femoral bypass. This patient was admitted 5 days ago with a GI bleed and ultimately underwent cardiac catheterization and had stenting performed via right common femoral approach. Yesterday the patient began having some discomfort in the left leg which at one time was severe but then improved significantly during the night. Today he has had some mild discomfort but no numbness in the left leg. He states that his symptoms are much improved today. Doppler studies were ordered this morning and results revealed occlusion of the femoral-femoral bypass with an ABI of 0.5 on the right and inaudible on the left. Consultation was requested regarding this thrombosed bypass which was placed by Dr. Georganna Skeans 10 years ago. Patient has had stenting of his right iliac system by Dr. York Ram.  Past Medical History  Diagnosis Date  . Asthma   . Hypertension   . Pulmonary embolism   . Peripheral artery disease   . Hypercholesteremia   . Bursitis of right shoulder   . DVT of leg (deep venous thrombosis)   . Stroke   . Cough   . Trouble swallowing   . Change in voice   . Difficulty urinating   . Easy bruising   . CAD (coronary artery disease) 10/02/2011    Severe 2 Vessel CAD with early Mid LAD 95% focal lesion followed by 60-70% irregular stenoses just after bifurcating SP1 and AV Groove Cx-OM1/OM2 bifurcation 90% lesion    History  Substance Use Topics  . Smoking status: Former Smoker    Quit date: 08/20/1993  . Smokeless tobacco: Never Used  . Alcohol Use: No    History reviewed. No pertinent family history.  No Known Allergies  Current facility-administered medications:0.9 %  sodium chloride infusion, 250 mL, Intravenous, PRN, Orbie Hurst, MD, 250 mL at 10/01/11 1900;  0.9 %  sodium chloride infusion, ,  Intravenous, Continuous, Wilburt Finlay, PA, Last Rate: 50 mL/hr at 10/02/11 0841, 50 mL at 10/02/11 0841;  0.9 %  sodium chloride infusion, 250 mL, Intravenous, Continuous, Marykay Lex, MD, Last Rate: 10 mL/hr at 10/02/11 2152, 250 mL at 10/02/11 2152 alum & mag hydroxide-simeth (MAALOX/MYLANTA) 200-200-20 MG/5ML suspension 30 mL, 30 mL, Oral, PRN, Darnelle Bos, MD, 30 mL at 09/30/11 1606;  aspirin chewable tablet 81 mg, 81 mg, Oral, Daily, Marykay Lex, MD, 81 mg at 10/04/11 0956;  clopidogrel (PLAVIX) tablet 75 mg, 75 mg, Oral, Q breakfast, Marykay Lex, MD, 75 mg at 10/04/11 0755 HYDROcodone-homatropine (HYCODAN) 5-1.5 MG/5ML syrup 5 mL, 5 mL, Oral, Q6H PRN, Coralyn Helling, MD, 5 mL at 10/03/11 2156;  isosorbide mononitrate (IMDUR) 24 hr tablet 30 mg, 30 mg, Oral, Daily, Marykay Lex, MD, 30 mg at 10/04/11 0956;  lactose free nutrition (BOOST PLUS) liquid 237 mL, 237 mL, Oral, BID BM, Anastasio Champion, RD, 237 mL at 10/04/11 0956;  metoprolol (LOPRESSOR) injection 2.5 mg, 2.5 mg, Intravenous, Once, Nada Boozer, NP metoprolol (LOPRESSOR) tablet 100 mg, 100 mg, Oral, BID, Marykay Lex, MD, 100 mg at 10/04/11 4098;  morphine 2 MG/ML injection 2 mg, 2 mg, Intravenous, Q2H PRN, Marykay Lex, MD, 2 mg at 10/04/11 1000;  nitroGLYCERIN (NITROSTAT) SL tablet 0.4 mg, 0.4 mg, Sublingual, Q5 Min x 3 PRN, Catha Brow, MD, 0.4 mg at 09/28/11 0938;  ondansetron (ZOFRAN) injection 4 mg, 4 mg, Intravenous, Q6H PRN,  Marykay Lex, MD pantoprazole (PROTONIX) EC tablet 40 mg, 40 mg, Oral, BID AC, Vertell Novak., MD, 40 mg at 10/04/11 0755;  polyethylene glycol (MIRALAX / GLYCOLAX) packet 17 g, 17 g, Oral, Daily, Vertell Novak., MD, 17 g at 10/04/11 0956;  simvastatin (ZOCOR) tablet 10 mg, 10 mg, Oral, q1800, Abelino Derrick, PA, 10 mg at 10/03/11 1737;  sodium chloride 0.9 % injection 3 mL, 3 mL, Intravenous, Q12H, Marykay Lex, MD, 3 mL at 10/04/11 0956 sodium chloride 0.9 % injection 3  mL, 3 mL, Intravenous, PRN, Marykay Lex, MD;  DISCONTD: metoprolol (LOPRESSOR) tablet 50 mg, 50 mg, Oral, TID, Abelino Derrick, PA, 50 mg at 10/03/11 1546  BP 106/63  Pulse 109  Temp(Src) 98 F (36.7 C) (Oral)  Resp 24  Ht 5\' 7"  (1.702 m)  Wt 134 lb 7.7 oz (61 kg)  BMI 21.06 kg/m2  SpO2 95%  Body mass index is 21.06 kg/(m^2).         Past Medical History  Diagnosis Date  . Asthma   . Hypertension   . Pulmonary embolism   . Peripheral artery disease   . Hypercholesteremia   . Bursitis of right shoulder   . DVT of leg (deep venous thrombosis)   . Stroke   . Cough   . Trouble swallowing   . Change in voice   . Difficulty urinating   . Easy bruising   . CAD (coronary artery disease) 10/02/2011    Severe 2 Vessel CAD with early Mid LAD 95% focal lesion followed by 60-70% irregular stenoses just after bifurcating SP1 and AV Groove Cx-OM1/OM2 bifurcation 90% lesion   Past Surgical History  Procedure Date  . Hernia repair   . Appendectomy   . Femoral-femoral bypass graft   . Iliac artery stent   . Esophagogastroduodenoscopy 07/03/2011    Procedure: ESOPHAGOGASTRODUODENOSCOPY (EGD);  Surgeon: Shirley Friar, MD;  Location: [facility];  Service: Endoscopy;  Laterality: N/A;  . Compression hip screw 06/26/2011    Procedure: COMPRESSION HIP;  Surgeon: Raymon Mutton, MD;  Location: [facility];  Service: Orthopedics;  Laterality: Right;  . Laparoscopy 07/10/2011    Procedure: LAPAROSCOPY DIAGNOSTIC;  Surgeon: Ardeth Sportsman, MD;  Location: [facility];  Service: General;  Laterality: N/A;  serousal repair,  . Esophagogastroduodenoscopy 09/28/2011    Procedure: ESOPHAGOGASTRODUODENOSCOPY (EGD);  Surgeon: Willis Modena, MD;  Location: [facility];  Service: Endoscopy;  Laterality: N/A;   History   Social History  . Marital Status: Married    Spouse Name: N/A    Number of Children: N/A  . Years of Education: N/A   Social History Main Topics  . Smoking status: Former Smoker     Quit date: 08/20/1993  . Smokeless tobacco: Never Used  . Alcohol Use: No  . Drug Use: No  . Sexually Active: No   Other Topics Concern  . None   Social History Narrative  . None   History reviewed. No pertinent family history. No Known Allergies Prior to Admission medications   Medication Sig Start Date End Date Taking? Authorizing Provider  clopidogrel (PLAVIX) 75 MG tablet Take 75 mg by mouth daily.   Yes Historical Provider, MD  diltiazem (DILACOR XR) 180 MG 24 hr capsule Take 180 mg by mouth daily.   Yes Historical Provider, MD  lactose free nutrition (BOOST PLUS) LIQD Take 237 mLs by mouth 3 (three) times daily with meals. 08/04/11  Yes Katy Apo,  MD  metoCLOPramide (REGLAN) 10 MG tablet Take 10 mg by mouth 3 (three) times daily.   Yes Historical Provider, MD  metoprolol (LOPRESSOR) 50 MG tablet Take 1 tablet (50 mg total) by mouth 2 (two) times daily. 08/04/11 08/03/12 Yes Katy Apo, MD  Multiple Vitamin (MULITIVITAMIN WITH MINERALS) TABS Take 1 tablet by mouth daily.   Yes Historical Provider, MD  NON FORMULARY 30 mLs 2 (two) times daily. Provide Gold Regular Liquid - for wound healing.   Yes Historical Provider, MD  pantoprazole (PROTONIX) 40 MG tablet Take 40 mg by mouth daily.   Yes Historical Provider, MD  simvastatin (ZOCOR) 10 MG tablet Take 10 mg by mouth daily.   Yes Historical Provider, MD  Tamsulosin HCl (FLOMAX) 0.4 MG CAPS Take 0.4 mg by mouth daily.   Yes Historical Provider, MD  vitamin C (ASCORBIC ACID) 500 MG tablet Take 500 mg by mouth 2 (two) times daily.   Yes Historical Provider, MD  warfarin (COUMADIN) 10 MG tablet Take 10 mg by mouth daily.   Yes Historical Provider, MD  metoCLOPramide (REGLAN) 10 MG tablet Take 1 tablet (10 mg total) by mouth 3 (three) times daily before meals. 08/04/11 08/14/11  Katy Apo, MD      All other systems have been reviewed and were otherwise negative with the exception of those mentioned in the HPI and as  above.  Physical Exam: Filed Vitals:   10/04/11 1559  BP: 106/63  Pulse: 109  Temp: 98 F (36.7 C)  Resp: 24    General: Alert, no acute distress HEENT: Normal for age Cardiovascular: Regular rate and rhythm. Carotid pulses 2+, no bruits audible Respiratory: Clear to auscultation. No cyanosis, no use of accessory musculature GI: No organomegaly, abdomen is soft and non-tender Skin: No lesions in the area of chief complaint Neurologic: Sensation intact distally Psychiatric: Patient is competent for consent with normal mood and affect Musculoskeletal: No obvious deformities Extremities: Right leg has 3+ femoral, absent popliteal and distal pulses. Left leg has absent femoral and distal pulses. There is no calf tenderness. There is good motion and sensation in the left foot. Right leg is unremarkable.  Labs reviewed: Hemoglobin 9.8    Assessment/Plan:  Thrombosed right to left femoral-femoral bypass which occurred following cardiac catheterization via right common femoral approach Patient does not have profound ischemia of left leg at this time Plan thrombectomy of right to left femoral-femoral bypass by Dr. Tawanna Cooler early tomorrow Risks and benefits have been thoroughly discussed with patient and daughter   Josephina Gip, MD 10/04/2011 5:43 PM

## 2011-10-04 NOTE — Progress Notes (Addendum)
Dr. Herbie Baltimore called and made aware of abnormal results from the duplex scan of the pt's lower extremities.  Dr. Herbie Baltimore even spoke to the Vascular Lab Tech that performed the test.

## 2011-10-04 NOTE — Progress Notes (Signed)
Subjective: S/p IVC filter- some oozing at site resolved now- pressure dressing Left leg pain- card order doppler- with h/o PVD HBG table No CP/ SOB   Objective: Vital signs in last 24 hours: Temp:  [97.8 F (36.6 C)-98.8 F (37.1 C)] 98.3 F (36.8 C) (06/02 0816) Pulse Rate:  [89-120] 120  (06/02 0956) Resp:  [14-35] 18  (06/02 0816) BP: (104-149)/(60-89) 119/89 mmHg (06/02 0956) SpO2:  [91 %-100 %] 95 % (06/02 0816) Weight:  [61 kg (134 lb 7.7 oz)-608 kg (1340 lb 6.4 oz)] 61 kg (134 lb 7.7 oz) (06/02 0021) Weight change: 546.6 kg (1205 lb 0.6 oz) Last BM Date: 10/03/11  Intake/Output from previous day: 06/01 0701 - 06/02 0700 In: 776.5 [P.O.:720; I.V.:56.5] Out: 900 [Urine:900] Intake/Output this shift: Total I/O In: 3 [I.V.:3] Out: -   General appearance: alert Resp: clear to auscultation bilaterally Cardio: regular rate and rhythm GI: soft, non-tender; bowel sounds normal; no masses,  no organomegaly  Lab Results:  Basename 10/04/11 0415 10/03/11 0524  WBC 7.7 7.5  HGB 9.8* 9.6*  HCT 29.0* 28.8*  PLT 238 217   BMET  Basename 10/04/11 0415 10/03/11 0524  NA 136 134*  K 3.8 4.0  CL 103 101  CO2 23 24  GLUCOSE 114* 101*  BUN 10 8  CREATININE 1.11 1.04  CALCIUM 9.0 8.7    Studies/Results: Ir Ivc Filter Plmt / S&i /img Guid/mod Sed  10/03/2011  *RADIOLOGY REPORT*  Clinical Data:  Bilateral DVT, GI bleed, the patient cannot be anticoagulated  ULTRASOUND GUIDANCE FOR VASCULAR ACCESS IVC CATHETERIZATION AND VENOGRAM IVC FILTER INSERTION  Date:  10/03/2011 10:45:00  Radiologist:  Judie Petit. Ruel Favors, M.D.  Medications:  1% lidocaine locally  Guidance:  Ultrasound fluoroscopic  Fluoroscopy time:  0.7 minutes  Sedation time:  None.  Contrast Volume:  50 ml Omnipaque-300  Complications:  No immediate  PROCEDURE/FINDINGS:  Informed consent was obtained from the patient following explanation of the procedure, risks, benefits and alternatives. The patient understands, agrees  and consents for the procedure. All questions were addressed.  A time out was performed.  Maximal barrier sterile technique utilized including caps, mask, sterile gowns, sterile gloves, large sterile drape, hand hygiene, and betadine prep.  Under sterile condition and local anesthesia, right internal jugular venous access was performed with ultrasound.  Over a guide wire, the IVC filter delivery sheath and inner dilator were advanced into the IVC just above the IVC bifurcation.  Contrast injection was performed for an IVC venogram.  IVC VENOGRAM:  The IVC is patent.  No evidence of thrombus, stenosis, or occlusion.  No variant venous anatomy.  The renal veins are identified at L2.  IVC FILTER INSERTION:  Through the delivery sheath, the Cordis TrapEase IVC filter was deployed in the infrarenal IVC at the L3 level just below the renal veins and above the IVC bifurcation. Contrast injection confirmed position.  There is good apposition of the filter against the IVC.  The delivery sheath was removed and hemostasis was obtained with compression for 5 minutes.  The patient tolerated the procedure well.  No immediate complications.  IMPRESSION: Ultrasound and fluoroscopically guided infrarenal IVC filter insertion.  Original Report Authenticated By: Judie Petit. Ruel Favors, M.D.    Medications: I have reviewed the patient's current medications.  Assessment/Plan: GI bleed, recurrent, off coumadin- HGB better- stable no more bleed  Active Problems:  PE (pulmonary embolism) July 2012 - coumadin off - IVC filter  done No coumadin for now GI bleed  recurrent  Fracture of hip, right, surg -Feb 2013  PVD, L-R FFBPG '01, RCIA PTA 3/09, REIA PTA 8/11  SBO (small bowel obstruction), lysis of adhesions Feb 2013 - GI ok  NSTEMI (non-ST elevated myocardial infarction) - CATH- with STENTS; multi vessel disease- cardiology - per card plavix ASA DVT (deep venous thrombosis) - IVC filter  CVA, Rt brain Feb 2013  History of GI bleed  Feb 2013 - recurrent- given Card issue- hold off Colon eval  History of renal insufficiency syndrome, 7/12 and 3/13- SCr Nl (1.1) now  Cardiomyopathy, EF 40-50% 2D 09/27/11  Unstable angina - improved with stents. PT consult Doppler of leg - PVD SNF   LOS: 8 days   Samik Balkcom 10/04/2011, 10:05 AM

## 2011-10-04 NOTE — Progress Notes (Addendum)
VASCULAR LAB PRELIMINARY  ARTERIAL  ABI completed:    RIGHT    LEFT    PRESSURE WAVEFORM  PRESSURE WAVEFORM  BRACHIAL 127 Triphasic BRACHIAL 133 Triphasic  DP 67 Dampened monophasic DP Not found   AT   AT Not found   PT 44 Dampened monophasic PT Not found   PER   PER Not found     RIGHT LEFT  ABI 0.50 N/A   ABIs on the right indicate a severe reduction in arterial blood flow. Left ABI not ascertained secondary to absent pedal Doppler signals.  Duplex scan of the left lower extremity indicates an occlusion of the Right to Left Femoral to Femoral bypass graft extending through the left lower extremity. There is also noted a > 50% stenosis of the femoral artery on the right..Minimal collateral flow noted on the left.  Cannot rule out a possible pseudoaneurysm at the anastomosis of the right FEM-Fem Graft. Velocities and Doppler signals greatly diminish moving distally to the ankle.  [name] D, RVS 10/04/2011, 10:55 AM

## 2011-10-04 NOTE — Progress Notes (Signed)
PT Cancellation Note  Treatment cancelled today due to medical issues with patient which prohibited therapy.  Patient with abnormal results from LE doppler.  RN contacting MD for additional testing and requests we hold PT today.  Will return tomorrow to attempt PT evaluation. Durenda Hurt Earlene Plater PT, [facility] Pager [phone]   Vena Austria 10/04/2011, 2:55 PM

## 2011-10-05 ENCOUNTER — Encounter (HOSPITAL_COMMUNITY): Payer: Self-pay | Admitting: Anesthesiology

## 2011-10-05 ENCOUNTER — Encounter (HOSPITAL_COMMUNITY): Payer: Self-pay | Admitting: Certified Registered Nurse Anesthetist

## 2011-10-05 ENCOUNTER — Inpatient Hospital Stay (HOSPITAL_COMMUNITY): Payer: Medicare HMO | Admitting: Anesthesiology

## 2011-10-05 ENCOUNTER — Encounter (HOSPITAL_COMMUNITY)
Admission: EM | Disposition: A | Discharge status: Skilled Nursing Facility | Payer: Self-pay | Source: Home / Self Care | Attending: Internal Medicine

## 2011-10-05 DIAGNOSIS — T82898A Other specified complication of vascular prosthetic devices, implants and grafts, initial encounter: Secondary | ICD-10-CM

## 2011-10-05 DIAGNOSIS — R000 Tachycardia, unspecified: Secondary | ICD-10-CM | POA: Diagnosis present

## 2011-10-05 HISTORY — PX: FEMORAL-FEMORAL BYPASS GRAFT: SHX936

## 2011-10-05 LAB — CBC
MCHC: 33.2 g/dL (ref 30.0–36.0)
MCV: 85.8 fL (ref 78.0–100.0)
Platelets: 256 10*3/uL (ref 150–400)
RDW: 15.9 % — ABNORMAL HIGH (ref 11.5–15.5)
WBC: 7.6 10*3/uL (ref 4.0–10.5)

## 2011-10-05 LAB — COMPREHENSIVE METABOLIC PANEL
AST: 11 U/L (ref 0–37)
Albumin: 2.7 g/dL — ABNORMAL LOW (ref 3.5–5.2)
BUN: 12 mg/dL (ref 6–23)
Chloride: 104 mEq/L (ref 96–112)
Creatinine, Ser: 1.06 mg/dL (ref 0.50–1.35)
Potassium: 4.1 mEq/L (ref 3.5–5.1)
Total Bilirubin: 0.3 mg/dL (ref 0.3–1.2)
Total Protein: 6.4 g/dL (ref 6.0–8.3)

## 2011-10-05 LAB — PROTIME-INR
INR: 1.16 (ref 0.00–1.49)
Prothrombin Time: 15 s (ref 11.6–15.2)

## 2011-10-05 SURGERY — CREATION, BYPASS, ARTERIAL, FEMORAL TO FEMORAL, USING GRAFT
Anesthesia: General | Site: Groin | Laterality: Bilateral | Wound class: Clean

## 2011-10-05 MED ORDER — POTASSIUM CHLORIDE CRYS ER 20 MEQ PO TBCR: 20.0000 meq | EXTENDED_RELEASE_TABLET | Freq: Once | ORAL | Status: AC | PRN

## 2011-10-05 MED ORDER — SODIUM CHLORIDE 0.9 % IV SOLN
INTRAVENOUS | Status: DC
  Administered 2011-10-05 – 2011-10-07 (×4): via INTRAVENOUS

## 2011-10-05 MED ORDER — SODIUM CHLORIDE 0.9 % IV SOLN
10.0000 mg | INTRAVENOUS | Status: DC | PRN
  Administered 2011-10-05: 50 ug/min via INTRAVENOUS

## 2011-10-05 MED ORDER — METOPROLOL TARTRATE 1 MG/ML IV SOLN: 2.0000 mg | INTRAVENOUS | Status: DC | PRN

## 2011-10-05 MED ORDER — PROTAMINE SULFATE 10 MG/ML IV SOLN
INTRAVENOUS | Status: DC | PRN
  Administered 2011-10-05: 10 mg via INTRAVENOUS
  Administered 2011-10-05 (×2): 20 mg via INTRAVENOUS

## 2011-10-05 MED ORDER — ACETAMINOPHEN 325 MG PO TABS
325.0000 mg | ORAL_TABLET | ORAL | Status: DC | PRN
  Administered 2011-10-06 – 2011-10-08 (×5): 650 mg via ORAL
  Filled 2011-10-05 (×3): qty 2
  Filled 2011-10-05: qty 1
  Filled 2011-10-05 (×2): qty 2

## 2011-10-05 MED ORDER — PROPOFOL 10 MG/ML IV EMUL
INTRAVENOUS | Status: DC | PRN
  Administered 2011-10-05: 150 mg via INTRAVENOUS

## 2011-10-05 MED ORDER — GUAIFENESIN-DM 100-10 MG/5ML PO SYRP
15.0000 mL | ORAL_SOLUTION | ORAL | Status: DC | PRN
  Administered 2011-10-08: 15 mL via ORAL
  Filled 2011-10-05 (×2): qty 15

## 2011-10-05 MED ORDER — NEOSTIGMINE METHYLSULFATE 1 MG/ML IJ SOLN
INTRAMUSCULAR | Status: DC | PRN
  Administered 2011-10-05: 3.5 mg via INTRAVENOUS

## 2011-10-05 MED ORDER — BISACODYL 5 MG PO TBEC
5.0000 mg | DELAYED_RELEASE_TABLET | Freq: Every day | ORAL | Status: DC | PRN
  Administered 2011-10-10: 5 mg via ORAL
  Filled 2011-10-05: qty 1

## 2011-10-05 MED ORDER — FENTANYL CITRATE 0.05 MG/ML IJ SOLN
INTRAMUSCULAR | Status: DC | PRN
  Administered 2011-10-05: 50 ug via INTRAVENOUS
  Administered 2011-10-05: 150 ug via INTRAVENOUS

## 2011-10-05 MED ORDER — HEPARIN SODIUM (PORCINE) 1000 UNIT/ML IJ SOLN
INTRAMUSCULAR | Status: DC | PRN
Start: 2011-10-05 — End: 2011-10-05
  Administered 2011-10-05: 7000 [IU] via INTRAVENOUS

## 2011-10-05 MED ORDER — SODIUM CHLORIDE 0.9 % IV SOLN
INTRAVENOUS | Status: DC
  Administered 2011-10-05: 10 mL via INTRAVENOUS

## 2011-10-05 MED ORDER — LACTATED RINGERS IV SOLN
INTRAVENOUS | Status: DC | PRN
  Administered 2011-10-05 (×2): via INTRAVENOUS

## 2011-10-05 MED ORDER — ACETAMINOPHEN 650 MG RE SUPP: 325.0000 mg | RECTAL | Status: DC | PRN

## 2011-10-05 MED ORDER — PANTOPRAZOLE SODIUM 40 MG PO TBEC
40.0000 mg | DELAYED_RELEASE_TABLET | Freq: Every day | ORAL | Status: DC
  Administered 2011-10-05 – 2011-10-12 (×8): 40 mg via ORAL
  Filled 2011-10-05 (×6): qty 1

## 2011-10-05 MED ORDER — DOCUSATE SODIUM 100 MG PO CAPS
100.0000 mg | ORAL_CAPSULE | Freq: Every day | ORAL | Status: DC
  Administered 2011-10-06 – 2011-10-12 (×6): 100 mg via ORAL
  Filled 2011-10-05 (×7): qty 1

## 2011-10-05 MED ORDER — DEXTROSE 5 % IV SOLN
1.5000 g | Freq: Two times a day (BID) | INTRAVENOUS | Status: AC
  Administered 2011-10-05 – 2011-10-06 (×2): 1.5 g via INTRAVENOUS
  Filled 2011-10-05 (×2): qty 1.5

## 2011-10-05 MED ORDER — HYDROMORPHONE HCL PF 1 MG/ML IJ SOLN
0.2500 mg | INTRAMUSCULAR | Status: DC | PRN
  Administered 2011-10-07 – 2011-10-09 (×2): 0.5 mg via INTRAVENOUS
  Filled 2011-10-05 (×2): qty 1

## 2011-10-05 MED ORDER — DOPAMINE-DEXTROSE 3.2-5 MG/ML-% IV SOLN: 3.0000 ug/kg/min | INTRAVENOUS | Status: DC

## 2011-10-05 MED ORDER — PHENOL 1.4 % MT LIQD: 1.0000 | OROMUCOSAL | Status: DC | PRN

## 2011-10-05 MED ORDER — HYDRALAZINE HCL 20 MG/ML IJ SOLN
10.0000 mg | INTRAMUSCULAR | Status: DC | PRN
  Filled 2011-10-05: qty 0.5

## 2011-10-05 MED ORDER — SENNOSIDES-DOCUSATE SODIUM 8.6-50 MG PO TABS
1.0000 | ORAL_TABLET | Freq: Every evening | ORAL | Status: DC | PRN
  Filled 2011-10-05: qty 1

## 2011-10-05 MED ORDER — VECURONIUM BROMIDE 10 MG IV SOLR
INTRAVENOUS | Status: DC | PRN
  Administered 2011-10-05: 2 mg via INTRAVENOUS
  Administered 2011-10-05: 7 mg via INTRAVENOUS

## 2011-10-05 MED ORDER — LACTATED RINGERS IV SOLN
INTRAVENOUS | Status: DC
  Administered 2011-10-05: 13:00:00 via INTRAVENOUS

## 2011-10-05 MED ORDER — DEXTROSE 5 % IV SOLN
1.5000 g | INTRAVENOUS | Status: DC
  Filled 2011-10-05: qty 1.5

## 2011-10-05 MED ORDER — ONDANSETRON HCL 4 MG/2ML IJ SOLN: 4.0000 mg | Freq: Four times a day (QID) | INTRAMUSCULAR | Status: DC | PRN

## 2011-10-05 MED ORDER — 0.9 % SODIUM CHLORIDE (POUR BTL) OPTIME
TOPICAL | Status: DC | PRN
  Administered 2011-10-05: 2000 mL

## 2011-10-05 MED ORDER — MAGNESIUM SULFATE 40 MG/ML IJ SOLN
2.0000 g | Freq: Once | INTRAMUSCULAR | Status: AC | PRN
  Filled 2011-10-05: qty 50

## 2011-10-05 MED ORDER — SODIUM CHLORIDE 0.9 % IR SOLN
Status: DC | PRN
  Administered 2011-10-05: 14:00:00

## 2011-10-05 MED ORDER — SODIUM CHLORIDE 0.9 % IV SOLN: 500.0000 mL | Freq: Once | INTRAVENOUS | Status: AC | PRN

## 2011-10-05 MED ORDER — ALUM & MAG HYDROXIDE-SIMETH 200-200-20 MG/5ML PO SUSP: 15.0000 mL | ORAL | Status: DC | PRN

## 2011-10-05 MED ORDER — LABETALOL HCL 5 MG/ML IV SOLN
10.0000 mg | INTRAVENOUS | Status: DC | PRN
  Filled 2011-10-05: qty 4

## 2011-10-05 MED ORDER — GLYCOPYRROLATE 0.2 MG/ML IJ SOLN
INTRAMUSCULAR | Status: DC | PRN
  Administered 2011-10-05: .4 mg via INTRAVENOUS

## 2011-10-05 SURGICAL SUPPLY — 52 items
APL SKNCLS STERI-STRIP NONHPOA (GAUZE/BANDAGES/DRESSINGS) ×1
BENZOIN TINCTURE PRP APPL 2/3 (GAUZE/BANDAGES/DRESSINGS) ×2 IMPLANT
CANISTER SUCTION 2500CC (MISCELLANEOUS) ×2 IMPLANT
CANNULA VESSEL W/WING WO/VALVE (CANNULA) ×2 IMPLANT
CATH EMB 4FR 80CM (CATHETERS) ×1 IMPLANT
CATH EMB 5FR 80CM (CATHETERS) ×1 IMPLANT
CATH IRRIGATION 4FR (CATHETERS) IMPLANT
CLIP LIGATING EXTRA MED SLVR (CLIP) ×2 IMPLANT
CLIP LIGATING EXTRA SM BLUE (MISCELLANEOUS) ×2 IMPLANT
CLOTH BEACON ORANGE TIMEOUT ST (SAFETY) ×2 IMPLANT
COVER SURGICAL LIGHT HANDLE (MISCELLANEOUS) ×4 IMPLANT
DRAIN SNY 10X20 3/4 PERF (WOUND CARE) IMPLANT
DRAPE WARM FLUID 44X44 (DRAPE) ×2 IMPLANT
DRSG COVADERM 4X10 (GAUZE/BANDAGES/DRESSINGS) IMPLANT
DRSG COVADERM 4X8 (GAUZE/BANDAGES/DRESSINGS) ×1 IMPLANT
ELECT REM PT RETURN 9FT ADLT (ELECTROSURGICAL) ×2
ELECTRODE REM PT RTRN 9FT ADLT (ELECTROSURGICAL) ×1 IMPLANT
EVACUATOR SILICONE 100CC (DRAIN) IMPLANT
GLOVE BIO SURGEON STRL SZ 6.5 (GLOVE) ×2 IMPLANT
GLOVE BIOGEL PI IND STRL 6.5 (GLOVE) IMPLANT
GLOVE BIOGEL PI IND STRL 7.0 (GLOVE) IMPLANT
GLOVE BIOGEL PI IND STRL 7.5 (GLOVE) IMPLANT
GLOVE BIOGEL PI INDICATOR 6.5 (GLOVE) ×1
GLOVE BIOGEL PI INDICATOR 7.0 (GLOVE) ×1
GLOVE BIOGEL PI INDICATOR 7.5 (GLOVE) ×1
GLOVE ECLIPSE 6.5 STRL STRAW (GLOVE) ×1 IMPLANT
GLOVE SS BIOGEL STRL SZ 7 (GLOVE) IMPLANT
GLOVE SS BIOGEL STRL SZ 7.5 (GLOVE) ×1 IMPLANT
GLOVE SUPERSENSE BIOGEL SZ 7 (GLOVE) ×1
GLOVE SUPERSENSE BIOGEL SZ 7.5 (GLOVE) ×1
GOWN STRL NON-REIN LRG LVL3 (GOWN DISPOSABLE) ×7 IMPLANT
KIT BASIN OR (CUSTOM PROCEDURE TRAY) ×2 IMPLANT
KIT ROOM TURNOVER OR (KITS) ×2 IMPLANT
NS IRRIG 1000ML POUR BTL (IV SOLUTION) ×4 IMPLANT
PACK PERIPHERAL VASCULAR (CUSTOM PROCEDURE TRAY) ×2 IMPLANT
PAD ARMBOARD 7.5X6 YLW CONV (MISCELLANEOUS) ×4 IMPLANT
STAPLER VISISTAT 35W (STAPLE) IMPLANT
STRIP CLOSURE SKIN 1/2X4 (GAUZE/BANDAGES/DRESSINGS) ×2 IMPLANT
SUT ETHILON 3 0 PS 1 (SUTURE) IMPLANT
SUT PROLENE 2 0 FS (SUTURE) ×2 IMPLANT
SUT PROLENE 5 0 C 1 24 (SUTURE) ×4 IMPLANT
SUT PROLENE 6 0 CC (SUTURE) IMPLANT
SUT VIC AB 2-0 CTX 36 (SUTURE) ×4 IMPLANT
SUT VIC AB 3-0 SH 27 (SUTURE) ×4
SUT VIC AB 3-0 SH 27X BRD (SUTURE) ×2 IMPLANT
SYR 3ML LL SCALE MARK (SYRINGE) ×1 IMPLANT
TAPE STRIPS DRAPE STRL (GAUZE/BANDAGES/DRESSINGS) ×1 IMPLANT
TOWEL OR 17X24 6PK STRL BLUE (TOWEL DISPOSABLE) ×4 IMPLANT
TOWEL OR 17X26 10 PK STRL BLUE (TOWEL DISPOSABLE) ×2 IMPLANT
TRAY FOLEY CATH 14FRSI W/METER (CATHETERS) ×2 IMPLANT
UNDERPAD 30X30 INCONTINENT (UNDERPADS AND DIAPERS) ×2 IMPLANT
WATER STERILE IRR 1000ML POUR (IV SOLUTION) ×2 IMPLANT

## 2011-10-05 NOTE — Progress Notes (Signed)
Patient ID: [name], male   DOB: 1934/04/18, 78 y.o.   MRN: [id number] The patient is in the operating room holding area. He continues to have some discomfort in his left foot he does have motor and sensory function intact. His calf is completely nontender. He does not have a palpable left femoral pulse and does have a palpable right femoral pulse. He is scheduled for thrombectomy of his right to left fem-fem bypass. I explained the procedure and he understands and is ready for surgery

## 2011-10-05 NOTE — Op Note (Signed)
OPERATIVE REPORT  DATE OF SURGERY: 10/05/2011  PATIENT: [name], 78 y.o. male MRN: [id number]  DOB: [birth year]  PRE-OPERATIVE DIAGNOSIS: Occluded right to left fem-fem bypass  POST-OPERATIVE DIAGNOSIS:  Same  PROCEDURE: Thrombectomy of right to left fem-fem bypass via a left femoral anastomosis  SURGEON:  Gretta Began, M.D.  PHYSICIAN ASSISTANT: Collins  ANESTHESIA:  Gen.  EBL: 300 ml  Total I/O In: 1528.3 [I.V.:1528.3] Out: 400 [Urine:100; Blood:300]  BLOOD ADMINISTERED: None  DRAINS: None  SPECIMEN: None  COUNTS CORRECT:  YES  PLAN OF CARE: PACU   PATIENT DISPOSITION:  PACU - hemodynamically stable  PROCEDURE DETAILS: The patient was taken to the operating room placed supine position where the area of both groins and the left leg were prepped and draped in the usual sterile fashion. Patient had easily palpable right femoral pulse. An incision was made over the left groin to the prior scar and carried down to isolate the left limb of the fem-fem bypass. This was encircled with a vessel loop. The common superficial femoral and profundus arteries were also isolated and controlled. The patient was given 7000 units of intravenous heparin and after adequate circulation time the left hood of the anastomosis of the prior right to left fem-fem bypass was opened with an 11 blade and extended longitudinally with Potts scissors. The patient did have some mural thrombus from some ectasia of the anastomosis and this was removed. There was also fresh clot. There was good backbleeding from the profundus femoris artery. The superficial femoral artery was chronically occluded. There was a trickle flow from the native common femoral artery on the left. The fem-fem bypass was then thrombectomized with a 5 Fogarty cath catheter. Excellent inflow was encountered in the arterialized plug was removed. After a negative pass the graft was reoccluded. The incision and the graft was closed with a running  5-0 Prolene suture. Prior to completion of the anastomosis the usual flushing maneuvers were undertaken. Anastomosis was completed and the clamps removed with the flow restored. There is excellent Dopplers flow in the profundus femoris artery. There was posterior tibial signal at the ankle. This signal went away with occlusion of the fem-fem bypass. The patient was given 50 mg of protamine to reverse the heparin. The wounds were irrigated with saline and hemostasis was obtained with electrocautery. The wounds were closed with 2 layers of 2-0 Vicryl in the subcutaneous tissue. The skin was closed with 3-0 subcuticular Vicryl stitch. Benzoin and Steri-Strips were applied and a sterile dressing was applied. The patient was taken to the recovery room in stable condition   Gretta Began, M.D. 10/05/2011 4:10 PM

## 2011-10-05 NOTE — Anesthesia Postprocedure Evaluation (Signed)
  Anesthesia Post-op Note  Patient: [name]  Procedure(s) Performed: Procedure(s) (LRB): BYPASS GRAFT FEMORAL-FEMORAL ARTERY (Bilateral)  Patient Location: PACU  Anesthesia Type: General  Level of Consciousness: awake  Airway and Oxygen Therapy: Patient Spontanous Breathing  Post-op Pain: mild  Post-op Assessment: Post-op Vital signs reviewed  Post-op Vital Signs: Reviewed  Complications: No apparent anesthesia complications

## 2011-10-05 NOTE — Anesthesia Preprocedure Evaluation (Addendum)
Anesthesia Evaluation   Patient awake    Reviewed: Allergy & Precautions, H&P , NPO status , Patient's Chart, lab work & pertinent test results  Airway Mallampati: II TM Distance: >3 FB Neck ROM: full    Dental  (+) Poor Dentition and Dental Advidsory Given   Pulmonary asthma ,          Cardiovascular hypertension, Pt. on home beta blockers + angina + CAD and + Past MI     Neuro/Psych CVA, Residual Symptoms    GI/Hepatic Neg liver ROS, GERD-  ,  Endo/Other    Renal/GU CRFnegative Renal ROS     Musculoskeletal   Abdominal   Peds  Hematology negative hematology ROS (+)   Anesthesia Other Findings   Reproductive/Obstetrics                          Anesthesia Physical Anesthesia Plan  ASA: III  Anesthesia Plan: General   Post-op Pain Management:    Induction: Intravenous  Airway Management Planned: Oral ETT  Additional Equipment:   Intra-op Plan:   Post-operative Plan:   Informed Consent:   Dental Advisory Given  Plan Discussed with: Anesthesiologist and CRNA  Anesthesia Plan Comments:        Anesthesia Quick Evaluation

## 2011-10-05 NOTE — Progress Notes (Signed)
Called to verify the order for dopamine drip, but unable to get in touch with the ordering PA. Dopamine drip on hold for now.

## 2011-10-05 NOTE — Progress Notes (Signed)
PT Cancellation Note  Treatment cancelled today due to patient in surgery for fem fem bypass graft.  Will recheck tomorrow.  Thanks.  INGOLD,[name] 10/05/2011, 1:28 PM  [facility] [phone] (pager)

## 2011-10-05 NOTE — Progress Notes (Signed)
OT Cancellation Note  Treatment cancelled today due to patient receiving procedure or test. Pt having sx today. Will check back on 10/06/11.  Prairie Stenberg A OTR/L [phone] 10/05/2011, 11:27 AM

## 2011-10-05 NOTE — Progress Notes (Signed)
Received order post PCI. Noted multiple medical issues with planned fem bypass today. Will await ambulation orders and PT progress. Ethelda Chick CES, ACSM

## 2011-10-05 NOTE — Progress Notes (Signed)
Clinical Child psychotherapist received report from [employer] CSW.  Pt currently MCPO.  CSW to continue to follow and assist as needed.   Angelia Mould, MSW, [place] [phone]

## 2011-10-05 NOTE — Progress Notes (Signed)
Subjective: Events noted over the weekend. Patient status post heart catheterization with stents placedk mid LAD and left circumflex,status post IVC filter from IJ, pain in his left extremity yesterday, Doppler showing occlusion of femoral-femoral bypass, plans will surgery today , thrombectomy right to left femoral femoral bypass. There's been no bleeding over the weekend no recurrent complaints of chest pain. No problems with abdominal pain distention or urinary obstruction  Objective: Vital signs in last 24 hours: Temp:  [97.4 F (36.3 C)-98.7 F (37.1 C)] 97.4 F (36.3 C) (06/03 0757) Pulse Rate:  [100-122] 112  (06/03 0800) Resp:  [11-24] 21  (06/03 0800) BP: (106-128)/(59-99) 115/64 mmHg (06/03 0800) SpO2:  [92 %-95 %] 94 % (06/03 0800) Weight:  [61 kg (134 lb 7.7 oz)] 61 kg (134 lb 7.7 oz) (06/03 0058) Weight change: -547 kg (-1205 lb 14.7 oz) Last BM Date: 10/03/11  Intake/Output from previous day: 06/02 0701 - 06/03 0700 In: 917 [P.O.:714; I.V.:203] Out: 975 [Urine:975] Intake/Output this shift: Total I/O In: 128.3 [I.V.:128.3] Out: -   General appearance: alert and cooperative Resp: clear to auscultation bilaterally Cardio: regular rate and rhythm, S1, S2 normal, no murmur, click, rub or gallop Extremities: no edema, left leg cool  Lab Results:  Results for orders placed during the hospital encounter of 09/26/11 (from the past 24 hour(s))  APTT     Status: Normal   Collection Time   10/04/11  4:47 PM      Component Value Range   aPTT 30  24 - 37 (seconds)  PROTIME-INR     Status: Normal   Collection Time   10/04/11  4:47 PM      Component Value Range   Prothrombin Time 14.8  11.6 - 15.2 (seconds)   INR 1.14  0.00 - 1.49   TYPE AND SCREEN     Status: Normal (Preliminary result)   Collection Time   10/04/11  4:51 PM      Component Value Range   ABO/RH([name]) A POS     Antibody Screen NEG     Sample Expiration 10/07/2011     Unit Number [id number]     Blood Component  Type RED CELLS,LR     Unit division 00     Status of Unit ALLOCATED     Transfusion Status OK TO TRANSFUSE     Crossmatch Result Compatible     Unit Number [id number]     Blood Component Type RED CELLS,LR     Unit division 00     Status of Unit ALLOCATED     Transfusion Status OK TO TRANSFUSE     Crossmatch Result Compatible    PREPARE RBC (CROSSMATCH)     Status: Normal   Collection Time   10/04/11  4:51 PM      Component Value Range   Order Confirmation ORDER PROCESSED BY BLOOD BANK    ABO/RH     Status: Normal   Collection Time   10/04/11  4:51 PM      Component Value Range   ABO/RH([name]) A POS    PROTIME-INR     Status: Normal   Collection Time   10/05/11  6:55 AM      Component Value Range   Prothrombin Time 15.0  11.6 - 15.2 (seconds)   INR 1.16  0.00 - 1.49       Studies/Results: Ir Ivc Filter Plmt / S&i /img Guid/mod Sed  10/03/2011  *RADIOLOGY REPORT*  Clinical Data:  Bilateral DVT, GI bleed,  the patient cannot be anticoagulated  ULTRASOUND GUIDANCE FOR VASCULAR ACCESS IVC CATHETERIZATION AND VENOGRAM IVC FILTER INSERTION  Date:  10/03/2011 10:45:00  Radiologist:  Judie Petit. Ruel Favors, M.[name].  Medications:  1% lidocaine locally  Guidance:  Ultrasound fluoroscopic  Fluoroscopy time:  0.7 minutes  Sedation time:  None.  Contrast Volume:  50 ml Omnipaque-300  Complications:  No immediate  PROCEDURE/FINDINGS:  Informed consent was obtained from the patient following explanation of the procedure, risks, benefits and alternatives. The patient understands, agrees and consents for the procedure. All questions were addressed.  A time out was performed.  Maximal barrier sterile technique utilized including caps, mask, sterile gowns, sterile gloves, large sterile drape, hand hygiene, and betadine prep.  Under sterile condition and local anesthesia, right internal jugular venous access was performed with ultrasound.  Over a guide wire, the IVC filter delivery sheath and inner dilator were advanced into the  IVC just above the IVC bifurcation.  Contrast injection was performed for an IVC venogram.  IVC VENOGRAM:  The IVC is patent.  No evidence of thrombus, stenosis, or occlusion.  No variant venous anatomy.  The renal veins are identified at L2.  IVC FILTER INSERTION:  Through the delivery sheath, the Cordis TrapEase IVC filter was deployed in the infrarenal IVC at the L3 level just below the renal veins and above the IVC bifurcation. Contrast injection confirmed position.  There is good apposition of the filter against the IVC.  The delivery sheath was removed and hemostasis was obtained with compression for 5 minutes.  The patient tolerated the procedure well.  No immediate complications.  IMPRESSION: Ultrasound and fluoroscopically guided infrarenal IVC filter insertion.  Original Report Authenticated By: Judie Petit. Ruel Favors, M.[name].    Medications:  Prior to Admission:  Prescriptions prior to admission  Medication Sig Dispense Refill  . clopidogrel (PLAVIX) 75 MG tablet Take 75 mg by mouth daily.      Marland Kitchen diltiazem (DILACOR XR) 180 MG 24 hr capsule Take 180 mg by mouth daily.      Marland Kitchen lactose free nutrition (BOOST PLUS) LIQD Take 237 mLs by mouth 3 (three) times daily with meals.      . metoCLOPramide (REGLAN) 10 MG tablet Take 10 mg by mouth 3 (three) times daily.      . metoprolol (LOPRESSOR) 50 MG tablet Take 1 tablet (50 mg total) by mouth 2 (two) times daily.      . Multiple Vitamin (MULITIVITAMIN WITH MINERALS) TABS Take 1 tablet by mouth daily.      . NON FORMULARY 30 mLs 2 (two) times daily. Provide Gold Regular Liquid - for wound healing.      . pantoprazole (PROTONIX) 40 MG tablet Take 40 mg by mouth daily.      . simvastatin (ZOCOR) 10 MG tablet Take 10 mg by mouth daily.      . Tamsulosin HCl (FLOMAX) 0.4 MG CAPS Take 0.4 mg by mouth daily.      . vitamin C (ASCORBIC ACID) 500 MG tablet Take 500 mg by mouth 2 (two) times daily.      Marland Kitchen warfarin (COUMADIN) 10 MG tablet Take 10 mg by mouth daily.       . metoCLOPramide (REGLAN) 10 MG tablet Take 1 tablet (10 mg total) by mouth 3 (three) times daily before meals.       Scheduled:   . aspirin  81 mg Oral Daily  . cefUROXime (ZINACEF)  IV  1.5 g Intravenous 30 min Pre-Op  .  clopidogrel  75 mg Oral Q breakfast  . isosorbide mononitrate  30 mg Oral Daily  . lactose free nutrition  237 mL Oral BID BM  . metoprolol  2.5 mg Intravenous Once  . metoprolol tartrate  100 mg Oral BID  . pantoprazole  40 mg Oral BID AC  . polyethylene glycol  17 g Oral Daily  . simvastatin  10 mg Oral q1800  . DISCONTD: sodium chloride  3 mL Intravenous Q12H   Continuous:   . sodium chloride 10 mL/hr at 10/05/11 0810  . DISCONTD: sodium chloride Stopped (10/05/11 0808)  . 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  CAD (coronary artery disease)  Presence of bare metal stent in LAD coronary artery  Presence of bare metal stent in left circumflex coronary artery  BPH (benign prostatic hypertrophy) with urinary retention occlusion of femoral bypass Continue current meds, await surgery.    LOS: 9 days   [name] 10/05/2011, 8:44 AM

## 2011-10-05 NOTE — Progress Notes (Addendum)
[facility]  Subjective: Complains of left leg pain.  No CP or SOB.  Does not feel elevated HR.  Objective: Vital signs in last 24 hours: Temp:  [97.4 F (36.3 C)-98.7 F (37.1 C)] 97.4 F (36.3 C) (06/03 0757) Pulse Rate:  [100-122] 112  (06/03 0800) Resp:  [11-24] 21  (06/03 0800) BP: (106-128)/(59-99) 115/64 mmHg (06/03 0800) SpO2:  [92 %-95 %] 94 % (06/03 0800) Weight:  [61 kg (134 lb 7.7 oz)] 61 kg (134 lb 7.7 oz) (06/03 0058) Last BM Date: 10/03/11  Intake/Output from previous day: 06/02 0701 - 06/03 0700 In: 917 [P.O.:714; I.V.:203] Out: 975 [Urine:975] Intake/Output this shift: Total I/O In: 128.3 [I.V.:128.3] Out: -   Medications Current Facility-Administered Medications  Medication Dose Route Frequency Provider Last Rate Last Dose  . 0.9 %  sodium chloride infusion  250 mL Intravenous PRN Orbie Hurst, MD   250 mL at 10/01/11 1900  . 0.9 %  sodium chloride infusion   Intravenous Continuous Pryor Ochoa, MD 10 mL/hr at 10/05/11 [phone]    . alum & mag hydroxide-simeth (MAALOX/MYLANTA) 200-200-20 MG/5ML suspension 30 mL  30 mL Oral PRN Darnelle Bos, MD   30 mL at 09/30/11 1606  . aspirin chewable tablet 81 mg  81 mg Oral Daily Marykay Lex, MD   81 mg at 10/05/11 0929  . cefUROXime (ZINACEF) 1.5 g in dextrose 5 % 50 mL IVPB  1.5 g Intravenous 30 min Pre-Op Pryor Ochoa, MD      . clopidogrel (PLAVIX) tablet 75 mg  75 mg Oral Q breakfast Marykay Lex, MD   75 mg at 10/05/11 0929  . HYDROcodone-homatropine (HYCODAN) 5-1.5 MG/5ML syrup 5 mL  5 mL Oral Q6H PRN Coralyn Helling, MD   5 mL at 10/04/11 2112  . isosorbide mononitrate (IMDUR) 24 hr tablet 30 mg  30 mg Oral Daily Marykay Lex, MD   30 mg at 10/05/11 0929  . lactose free nutrition (BOOST PLUS) liquid 237 mL  237 mL Oral BID BM Anastasio Champion, RD   237 mL at 10/04/11 1805  . metoprolol (LOPRESSOR) injection 2.5 mg  2.5 mg Intravenous Once Nada Boozer, NP      .  metoprolol (LOPRESSOR) tablet 100 mg  100 mg Oral BID Marykay Lex, MD   100 mg at 10/05/11 0929  . morphine 2 MG/ML injection 2 mg  2 mg Intravenous Q2H PRN Marykay Lex, MD   2 mg at 10/05/11 0517  . nitroGLYCERIN (NITROSTAT) SL tablet 0.4 mg  0.4 mg Sublingual Q5 Min x 3 PRN Catha Brow, MD   0.4 mg at 09/28/11 1191  . ondansetron (ZOFRAN) injection 4 mg  4 mg Intravenous Q6H PRN Marykay Lex, MD      . pantoprazole (PROTONIX) EC tablet 40 mg  40 mg Oral BID AC Vertell Novak., MD   40 mg at 10/05/11 0929  . polyethylene glycol (MIRALAX / GLYCOLAX) packet 17 g  17 g Oral Daily Vertell Novak., MD   17 g at 10/05/11 0930  . simvastatin (ZOCOR) tablet 10 mg  10 mg Oral q1800 Eda Paschal [place], [place]   10 mg at 10/04/11 1804  . DISCONTD: 0.9 %  sodium chloride infusion   Intravenous Continuous Wilburt Finlay, PA   50 mL at 10/05/11 0517  . DISCONTD: 0.9 %  sodium chloride infusion  250 mL Intravenous Continuous Marykay Lex, MD  250 mL at 10/02/11 2152  . DISCONTD: sodium chloride 0.9 % injection 3 mL  3 mL Intravenous Q12H Marykay Lex, MD   3 mL at 10/04/11 2111  . DISCONTD: sodium chloride 0.9 % injection 3 mL  3 mL Intravenous PRN Marykay Lex, MD        PE: General appearance: alert, cooperative and no distress Lungs: Right sided rales.  Decreased BS Heart: Rhythm regular and rate elvated.  No MM Extremities: No LEE Pulses: Radials 2+ and symmetric.  2+ PT pulses bilaterally.  1/2+ left DP. Left leg is considerably cooler than right. Skin: Right groin is nontender. no hematoma or ecchymosis.  Lab Results:   Basename 10/04/11 0415 10/03/11 0524  WBC 7.7 7.5  HGB 9.8* 9.6*  HCT 29.0* 28.8*  PLT 238 217   BMET  Basename 10/04/11 0415 10/03/11 0524  NA 136 134*  K 3.8 4.0  CL 103 101  CO2 23 24  GLUCOSE 114* 101*  BUN 10 8  CREATININE 1.11 1.04  CALCIUM 9.0 8.7   PT/INR  Basename 10/05/11 0655 10/04/11 1647  LABPROT 15.0 14.8  INR 1.16 1.14    Cholesterol No results found for this basename: CHOL in the last 72 hours Cardiac Enzymes No components found with this basename: TROPONIN:3, CKMB:3  Vascular Ultrasound results noted - Fem-Fem bypass occlusion.   Assessment/Plan  Principal Problem:  *GI bleed, recurrent, on Coumadin- hgb 7.0 on admission Active Problems:  Sinus tachycardia  PE (pulmonary embolism) July 2012  Chronic anticoagulation  Fracture of hip, right, surg -Feb 2013  PVD, L-R FFBPG '01, RCIA PTA 3/09, REIA PTA 8/11  SBO (small bowel obstruction), lysis of adhesions Feb 2013  NSTEMI (non-ST elevated myocardial infarction)  DVT (deep venous thrombosis)  CVA, 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  CAD (coronary artery disease)  Presence of bare metal stent in LAD coronary artery  Presence of bare metal stent in left circumflex coronary artery  BPH (benign prostatic hypertrophy) with urinary retention Occluded Fem-Fem bypass 10/04/11   Plan:  Thrombectomy of right to left femoral-femoral bypass today.  10/03/11: S/P successful 2 site / 2 vessel PCI of the mid LAD and AV-Groove Circumflex with Integrity BMS stents (2.5 mm, post-dilated to ~2.65 mm) with resolution of anginal pain.  S/P IVC filter placement 10/03/11.  Morphine for pain.  Maintaining sinus tach. 110's.  No SOB.  Lopressor at 100mg  BID.  BP stable and controlled.   LOS: 9 days    HAGER, BRYAN 10/05/2011 9:34 AM  I have seen & examined the patient this Am.  I agree with the findings & exam in [name] note.  He looks comfortable - main concern inf L left pain.   No further angina & BP/HR is stable on BB @ current dose.  Will check CBC & BMP today pre-op. Appreciate Vascular Sgx consult last PM -- for occluded Fem-Fem bypass. On ASA & Plavix for 2 vessel PCI IVC  filter in place - no warfarin for now.  No sign of GI bld - H/H has been stable.  Checking CBC this AM.  Marykay Lex, M.D., M.S. [facility] [address] [place], [place]  [address]  [phone] Pager # [phone]  10/05/2011 10:59 AM

## 2011-10-05 NOTE — Transfer of Care (Signed)
Immediate Anesthesia Transfer of Care Note  Patient: [name]  Procedure(s) Performed: Procedure(s) (LRB): BYPASS GRAFT FEMORAL-FEMORAL ARTERY (Bilateral)  Patient Location: PACU  Anesthesia Type: General  Level of Consciousness: awake, alert  and patient cooperative  Airway & Oxygen Therapy: Patient Spontanous Breathing and Patient connected to nasal cannula oxygen  Post-op Assessment: Report given to PACU RN, Post -op Vital signs reviewed and stable and Patient moving all extremities  Post vital signs: Reviewed and stable  Complications: No apparent anesthesia complications

## 2011-10-05 NOTE — Anesthesia Procedure Notes (Signed)
Procedure Name: Intubation Date/Time: 10/05/2011 2:18 PM Performed by: Carmela Rima Pre-anesthesia Checklist: Emergency Drugs available, Patient identified, Timeout performed, Suction available and Patient being monitored Patient Re-evaluated:Patient Re-evaluated prior to inductionOxygen Delivery Method: Circle system utilized Preoxygenation: Pre-oxygenation with 100% oxygen Intubation Type: IV induction Ventilation: Mask ventilation without difficulty Laryngoscope Size: Mac and 3 Grade View: Grade II Tube type: Oral Tube size: 7.5 mm Number of attempts: 1 Placement Confirmation: ETT inserted through vocal cords under direct vision,  breath sounds checked- equal and bilateral and positive ETCO2 Secured at: 22 cm Tube secured with: Tape Dental Injury: Teeth and Oropharynx as per pre-operative assessment

## 2011-10-06 ENCOUNTER — Encounter (HOSPITAL_COMMUNITY): Payer: Self-pay | Admitting: Vascular Surgery

## 2011-10-06 LAB — CBC
HCT: 26.9 % — ABNORMAL LOW (ref 39.0–52.0)
Hemoglobin: 8.8 g/dL — ABNORMAL LOW (ref 13.0–17.0)
MCH: 28.3 pg (ref 26.0–34.0)
MCHC: 32.7 g/dL (ref 30.0–36.0)
MCV: 86.5 fL (ref 78.0–100.0)
Platelets: 229 10*3/uL (ref 150–400)
RBC: 3.11 MIL/uL — ABNORMAL LOW (ref 4.22–5.81)
RDW: 15.9 % — ABNORMAL HIGH (ref 11.5–15.5)
WBC: 6.9 10*3/uL (ref 4.0–10.5)

## 2011-10-06 LAB — BASIC METABOLIC PANEL WITH GFR
BUN: 11 mg/dL (ref 6–23)
CO2: 24 meq/L (ref 19–32)
Calcium: 8.4 mg/dL (ref 8.4–10.5)
Chloride: 104 meq/L (ref 96–112)
Creatinine, Ser: 1.03 mg/dL (ref 0.50–1.35)
GFR calc Af Amer: 79 mL/min — ABNORMAL LOW
GFR calc non Af Amer: 68 mL/min — ABNORMAL LOW
Glucose, Bld: 109 mg/dL — ABNORMAL HIGH (ref 70–99)
Potassium: 4.5 meq/L (ref 3.5–5.1)
Sodium: 136 meq/L (ref 135–145)

## 2011-10-06 NOTE — Progress Notes (Signed)
1435  Spoke with PT after patient seen. Patient not appropriate for our services at this time Only able to stand. We will sign off at this time.Latresha Yahr DunlapRN

## 2011-10-06 NOTE — Progress Notes (Signed)
Subjective: Patient is doing well status post thrombectomy of right to left fem-fem bypass. His hemoglobin has dropped almost 1 g, there is no complaint of angina. He is still slightly tachycardic. He denies pain in his leg, apparently an order for dopamine is on the chart, currently it is not running and has not been needed. We will DC order  Objective: Vital signs in last 24 hours: Temp:  [97.3 F (36.3 C)-98.7 F (37.1 C)] 98.7 F (37.1 C) (06/04 0500) Pulse Rate:  [87-118] 98  (06/04 0500) Resp:  [10-22] 19  (06/04 0500) BP: (104-138)/(53-78) 116/62 mmHg (06/04 0500) SpO2:  [92 %-100 %] 98 % (06/04 0500) Weight change:  Last BM Date: 10/04/11  Intake/Output from previous day: 06/03 0701 - 06/04 0700 In: 2653.3 [I.V.:2653.3] Out: 1000 [Urine:700; Blood:300] Intake/Output this shift:    General appearance: alert and cooperative Resp: clear to auscultation bilaterally Cardio: Sinus tach Extremities: Reduce pulse lateral, legs warm well perfused, significant improvement on the left compared to yesterday  Lab Results:  Results for orders placed during the hospital encounter of 09/26/11 (from the past 24 hour(s))  CBC     Status: Abnormal   Collection Time   10/05/11 11:10 AM      Component Value Range   WBC 7.6  4.0 - 10.5 (K/uL)   RBC 3.37 (*) 4.22 - 5.81 (MIL/uL)   Hemoglobin 9.6 (*) 13.0 - 17.0 (g/dL)   HCT 16.1 (*) 09.6 - 52.0 (%)   MCV 85.8  78.0 - 100.0 (fL)   MCH 28.5  26.0 - 34.0 (pg)   MCHC 33.2  30.0 - 36.0 (g/dL)   RDW 04.5 (*) 40.9 - 15.5 (%)   Platelets 256  150 - 400 (K/uL)  COMPREHENSIVE METABOLIC PANEL     Status: Abnormal   Collection Time   10/05/11 11:10 AM      Component Value Range   Sodium 138  135 - 145 (mEq/L)   Potassium 4.1  3.5 - 5.1 (mEq/L)   Chloride 104  96 - 112 (mEq/L)   CO2 24  19 - 32 (mEq/L)   Glucose, Bld 108 (*) 70 - 99 (mg/dL)   BUN 12  6 - 23 (mg/dL)   Creatinine, Ser 8.11  0.50 - 1.35 (mg/dL)   Calcium 8.9  8.4 - 91.4 (mg/dL)     Total Protein 6.4  6.0 - 8.3 (g/dL)   Albumin 2.7 (*) 3.5 - 5.2 (g/dL)   AST 11  0 - 37 (U/L)   ALT 8  0 - 53 (U/L)   Alkaline Phosphatase 60  39 - 117 (U/L)   Total Bilirubin 0.3  0.3 - 1.2 (mg/dL)   GFR calc non Af Amer 66 (*) >90 (mL/min)   GFR calc Af Amer 77 (*) >90 (mL/min)  CBC     Status: Abnormal   Collection Time   10/06/11  4:20 AM      Component Value Range   WBC 6.9  4.0 - 10.5 (K/uL)   RBC 3.11 (*) 4.22 - 5.81 (MIL/uL)   Hemoglobin 8.8 (*) 13.0 - 17.0 (g/dL)   HCT 78.2 (*) 95.6 - 52.0 (%)   MCV 86.5  78.0 - 100.0 (fL)   MCH 28.3  26.0 - 34.0 (pg)   MCHC 32.7  30.0 - 36.0 (g/dL)   RDW 21.3 (*) 08.6 - 15.5 (%)   Platelets 229  150 - 400 (K/uL)  BASIC METABOLIC PANEL     Status: Abnormal   Collection Time  10/06/11  4:20 AM      Component Value Range   Sodium 136  135 - 145 (mEq/L)   Potassium 4.5  3.5 - 5.1 (mEq/L)   Chloride 104  96 - 112 (mEq/L)   CO2 24  19 - 32 (mEq/L)   Glucose, Bld 109 (*) 70 - 99 (mg/dL)   BUN 11  6 - 23 (mg/dL)   Creatinine, Ser 8.11  0.50 - 1.35 (mg/dL)   Calcium 8.4  8.4 - 91.4 (mg/dL)   GFR calc non Af Amer 68 (*) >90 (mL/min)   GFR calc Af Amer 79 (*) >90 (mL/min)      Studies/Results: No results found.  Medications:  Prior to Admission:  Prescriptions prior to admission  Medication Sig Dispense Refill  . clopidogrel (PLAVIX) 75 MG tablet Take 75 mg by mouth daily.      Marland Kitchen diltiazem (DILACOR XR) 180 MG 24 hr capsule Take 180 mg by mouth daily.      Marland Kitchen lactose free nutrition (BOOST PLUS) LIQD Take 237 mLs by mouth 3 (three) times daily with meals.      . metoCLOPramide (REGLAN) 10 MG tablet Take 10 mg by mouth 3 (three) times daily.      . metoprolol (LOPRESSOR) 50 MG tablet Take 1 tablet (50 mg total) by mouth 2 (two) times daily.      . Multiple Vitamin (MULITIVITAMIN WITH MINERALS) TABS Take 1 tablet by mouth daily.      . NON FORMULARY 30 mLs 2 (two) times daily. Provide Gold Regular Liquid - for wound healing.      .  pantoprazole (PROTONIX) 40 MG tablet Take 40 mg by mouth daily.      . simvastatin (ZOCOR) 10 MG tablet Take 10 mg by mouth daily.      . Tamsulosin HCl (FLOMAX) 0.4 MG CAPS Take 0.4 mg by mouth daily.      . vitamin C (ASCORBIC ACID) 500 MG tablet Take 500 mg by mouth 2 (two) times daily.      Marland Kitchen warfarin (COUMADIN) 10 MG tablet Take 10 mg by mouth daily.      . metoCLOPramide (REGLAN) 10 MG tablet Take 1 tablet (10 mg total) by mouth 3 (three) times daily before meals.       Scheduled:   . aspirin  81 mg Oral Daily  . cefUROXime (ZINACEF)  IV  1.5 g Intravenous Q12H  . clopidogrel  75 mg Oral Q breakfast  . docusate sodium  100 mg Oral Daily  . isosorbide mononitrate  30 mg Oral Daily  . lactose free nutrition  237 mL Oral BID BM  . metoprolol  2.5 mg Intravenous Once  . metoprolol tartrate  100 mg Oral BID  . pantoprazole  40 mg Oral Q1200  . polyethylene glycol  17 g Oral Daily  . simvastatin  10 mg Oral q1800  . DISCONTD: cefUROXime (ZINACEF)  IV  1.5 g Intravenous 30 min Pre-Op  . DISCONTD: pantoprazole  40 mg Oral BID AC   Continuous:   . sodium chloride 125 mL/hr at 10/06/11 0518  . DISCONTD: sodium chloride 10 mL/hr at 10/05/11 0810  . DISCONTD: DOPamine    . DISCONTD: lactated ringers 50 mL/hr at 10/05/11 1252    Assessment/Plan: Status post thrombectomy of occluded right to left fem-fem bypass. Patient doing well. Hemoglobin has dropped about 1 g, we will transfuse in light of known coronary artery disease, recent stent and recent prolonged bout of angina. Mobilize  when cleared by vascular surgery Coronary artery disease, cardiomyopathy, status post non-ST elevation MI status post stent LAD and circumflex Admitted with GI bleed hemoglobin 7 on admission requiring transfusion, EGD showing esophagitis and duodenitis. No recurrent signs of bleeding History of CVA with left hemiparesis History of PE DVT, now status post [place] filter BPH with history of outlet  obstruction History of prolonged bout of ileus/l small bowel obstruction, requiring lysis of adhesion, current abdominal exam unremarkable History of chronic kidney disease, current creatinine stable  LOS: 10 days   Artie Mcintyre D 10/06/2011, 8:14 AM

## 2011-10-06 NOTE — Progress Notes (Signed)
[facility]  Subjective: Feeling better  Objective: Vital signs in last 24 hours: Temp:  [97.3 F (36.3 C)-98.7 F (37.1 C)] 98.6 F (37 C) (06/04 1100) Pulse Rate:  [90-118] 108  (06/04 1100) Resp:  [14-30] 24  (06/04 1100) BP: (101-138)/(52-80) 131/63 mmHg (06/04 1100) SpO2:  [94 %-100 %] 100 % (06/04 0900) Last BM Date: 10/04/11  Intake/Output from previous day: 06/03 0701 - 06/04 0700 In: 2653.3 [I.V.:2653.3] Out: 1000 [Urine:700; Blood:300] Intake/Output this shift: Total I/O In: 612.5 [P.O.:480; Blood:132.5] Out: -   Medications Current Facility-Administered Medications  Medication Dose Route Frequency Provider Last Rate Last Dose  . 0.9 %  sodium chloride infusion  500 mL Intravenous Once PRN Lars Mage, PA      . 0.9 %  sodium chloride infusion   Intravenous Continuous Lars Mage, PA 125 mL/hr at 10/06/11 0518    . acetaminophen (TYLENOL) tablet 325-650 mg  325-650 mg Oral Q4H PRN Lars Mage, PA       Or  . acetaminophen (TYLENOL) suppository 325-650 mg  325-650 mg Rectal Q4H PRN Lars Mage, PA      . alum & mag hydroxide-simeth (MAALOX/MYLANTA) 200-200-20 MG/5ML suspension 15-30 mL  15-30 mL Oral Q2H PRN Lars Mage, PA      . aspirin chewable tablet 81 mg  81 mg Oral Daily Marykay Lex, MD   81 mg at 10/06/11 1058  . bisacodyl (DULCOLAX) EC tablet 5 mg  5 mg Oral Daily PRN Lars Mage, PA      . cefUROXime (ZINACEF) 1.5 g in dextrose 5 % 50 mL IVPB  1.5 g Intravenous Q12H Lars Mage, PA   1.5 g at 10/06/11 0517  . clopidogrel (PLAVIX) tablet 75 mg  75 mg Oral Q breakfast Marykay Lex, MD   75 mg at 10/06/11 1057  . docusate sodium (COLACE) capsule 100 mg  100 mg Oral Daily Lars Mage, PA   100 mg at 10/06/11 1058  . guaiFENesin-dextromethorphan (ROBITUSSIN DM) 100-10 MG/5ML syrup 15 mL  15 mL Oral Q4H PRN Lars Mage, PA      . hydrALAZINE (APRESOLINE) injection 10 mg  10 mg Intravenous Q2H PRN  Lars Mage, PA      . HYDROcodone-homatropine (HYCODAN) 5-1.5 MG/5ML syrup 5 mL  5 mL Oral Q6H PRN Coralyn Helling, MD   5 mL at 10/04/11 2112  . HYDROmorphone (DILAUDID) injection 0.25-0.5 mg  0.25-0.5 mg Intravenous Q5 min PRN Judie Petit, MD      . isosorbide mononitrate (IMDUR) 24 hr tablet 30 mg  30 mg Oral Daily Marykay Lex, MD   30 mg at 10/06/11 1058  . labetalol (NORMODYNE,TRANDATE) injection 10 mg  10 mg Intravenous Q2H PRN Lars Mage, PA      . lactose free nutrition (BOOST PLUS) liquid 237 mL  237 mL Oral BID BM Anastasio Champion, RD   237 mL at 10/06/11 1057  . magnesium sulfate IVPB 2 g 50 mL  2 g Intravenous Once PRN Lars Mage, PA      . metoprolol (LOPRESSOR) injection 2-5 mg  2-5 mg Intravenous Q2H PRN Lars Mage, PA      . metoprolol (LOPRESSOR) injection 2.5 mg  2.5 mg Intravenous Once Nada Boozer, NP      . metoprolol (LOPRESSOR) tablet 100 mg  100 mg Oral BID Marykay Lex, MD   100 mg at 10/06/11  1058  . morphine 2 MG/ML injection 2 mg  2 mg Intravenous Q2H PRN Marykay Lex, MD   2 mg at 10/05/11 [phone]  . nitroGLYCERIN (NITROSTAT) SL tablet 0.4 mg  0.4 mg Sublingual Q5 Min x 3 PRN Catha Brow, MD   0.4 mg at 09/28/11 9604  . ondansetron (ZOFRAN) injection 4 mg  4 mg Intravenous Q6H PRN Lars Mage, PA      . pantoprazole (PROTONIX) EC tablet 40 mg  40 mg Oral Q1200 Lars Mage, PA   40 mg at 10/05/11 1935  . phenol (CHLORASEPTIC) mouth spray 1 spray  1 spray Mouth/Throat PRN Lars Mage, PA      . polyethylene glycol (MIRALAX / GLYCOLAX) packet 17 g  17 g Oral Daily Vertell Novak., MD   17 g at 10/06/11 1059  . potassium chloride SA (K-DUR,KLOR-CON) CR tablet 20-40 mEq  20-40 mEq Oral Once PRN Lars Mage, PA      . senna-docusate (Senokot-S) tablet 1 tablet  1 tablet Oral QHS PRN Lars Mage, PA      . simvastatin (ZOCOR) tablet 10 mg  10 mg Oral [address] [place], [place]   10 mg at 10/05/11 1935  . DISCONTD: 0.9 %  sodium  chloride infusion  250 mL Intravenous PRN Orbie Hurst, MD   250 mL at 10/01/11 1900  . DISCONTD: 0.9 %  sodium chloride infusion   Intravenous Continuous Pryor Ochoa, MD 10 mL/hr at 10/05/11 [phone]    . DISCONTD: 0.9 % irrigation (POUR BTL)    PRN Larina Earthly, MD   2,000 mL at 10/05/11 1403  . DISCONTD: alum & mag hydroxide-simeth (MAALOX/MYLANTA) 200-200-20 MG/5ML suspension 30 mL  30 mL Oral PRN Darnelle Bos, MD   30 mL at 09/30/11 1606  . DISCONTD: cefUROXime (ZINACEF) 1.5 g in dextrose 5 % 50 mL IVPB  1.5 g Intravenous 30 min Pre-Op Pryor Ochoa, MD      . DISCONTD: DOPamine (INTROPIN) 800 mg in dextrose 5 % 250 mL infusion  3-5 mcg/kg/min Intravenous Continuous Lars Mage, PA      . DISCONTD: heparin 6,000 Units in sodium chloride irrigation 0.9 % 500 mL irrigation    PRN Larina Earthly, MD      . DISCONTD: lactated ringers infusion   Intravenous Continuous Raiford Simmonds, MD 50 mL/hr at 10/05/11 1252    . DISCONTD: ondansetron (ZOFRAN) injection 4 mg  4 mg Intravenous Q6H PRN Marykay Lex, MD      . DISCONTD: pantoprazole (PROTONIX) EC tablet 40 mg  40 mg Oral BID AC Vertell Novak., MD   40 mg at 10/05/11 8119   Facility-Administered Medications Ordered in Other Encounters  Medication Dose Route Frequency Provider Last Rate Last Dose  . DISCONTD: fentaNYL (SUBLIMAZE) injection    PRN Carmela Rima, CRNA   50 mcg at 10/05/11 1448  . DISCONTD: glycopyrrolate (ROBINUL) injection    PRN Sharlet Salina, CRNA   0.4 mg at 10/05/11 1554  . DISCONTD: heparin injection    PRN Sharlet Salina, CRNA   7,000 Units at 10/05/11 1518  . DISCONTD: lactated ringers infusion    Continuous PRN Carmela Rima, CRNA      . DISCONTD: neostigmine (PROSTIGMINE) injection   Intravenous PRN Sharlet Salina, CRNA   3.5 mg at 10/05/11 1554  . DISCONTD: phenylephrine (NEO-SYNEPHRINE) 0.04 mg/mL in sodium chloride 0.9 %  250 mL infusion  10 mg  Continuous PRN Carmela Rima, CRNA   50  mcg/min at 10/05/11 1416  . DISCONTD: propofol (DIPRIVAN) 10 mg/ml infusion    PRN Carmela Rima, CRNA   150 mg at 10/05/11 1416  . DISCONTD: protamine injection    PRN Sharlet Salina, CRNA   20 mg at 10/05/11 1546  . DISCONTD: vecuronium (NORCURON) injection    PRN Carmela Rima, CRNA   2 mg at 10/05/11 1523    PE: General appearance: alert, cooperative and no distress Lungs: clear to auscultation bilaterally Heart: Reg rhythm rate elevated no MM. Extremities: trace LEE Pulses: radials 2+ and symmetric.  DPs 2+   Lab Results:   Basename 10/06/11 0420 10/05/11 1110 10/04/11 0415  WBC 6.9 7.6 7.7  HGB 8.8* 9.6* 9.8*  HCT 26.9* 28.9* 29.0*  PLT 229 256 238   BMET  Basename 10/06/11 0420 10/05/11 1110 10/04/11 0415  NA 136 138 136  K 4.5 4.1 3.8  CL 104 104 103  CO2 24 24 23   GLUCOSE 109* 108* 114*  BUN 11 12 10   CREATININE 1.03 1.06 1.11  CALCIUM 8.4 8.9 9.0   PT/INR  Basename 10/05/11 0655 10/04/11 1647  LABPROT 15.0 14.8  INR 1.16 1.14   Cholesterol No results found for this basename: CHOL in the last 72 hours Cardiac Enzymes No components found with this basename: TROPONIN:3, CKMB:3  Studies/Results: @RISRSLT2 @   Assessment/Plan  Principal Problem:  *GI bleed, recurrent, on Coumadin- hgb 7.0 on admission Active Problems:  Sinus tachycardia  PE (pulmonary embolism) July 2012  Chronic anticoagulation  Fracture of hip, right, surg -Feb 2013  PVD, L-R FFBPG '01, RCIA PTA 3/09, REIA PTA 8/11  SBO (small bowel obstruction), lysis of adhesions Feb 2013  NSTEMI (non-ST elevated myocardial infarction)  DVT (deep venous thrombosis)  CVA, 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  CAD (coronary artery disease)  Presence of bare metal stent in LAD coronary artery  Presence of bare  metal stent in left circumflex coronary artery  BPH (benign prostatic hypertrophy) with urinary retention  Plan:  POD #1, S/P Thrombectomy of right to left fem-fem bypass via a left femoral anastomosis.  Monitor HR which is elevated with minimal exertion.  Lopressor at 100mg  bid. Getting PRBCs now.     LOS: 10 days    Mayreli Alden 10/06/2011 12:03 PM

## 2011-10-06 NOTE — Progress Notes (Signed)
Subjective: Interval History: none.. Denies pain in left foot  Objective: Vital signs in last 24 hours: Temp:  [97.3 F (36.3 C)-98.7 F (37.1 C)] 98.7 F (37.1 C) (06/04 0500) Pulse Rate:  [87-118] 98  (06/04 0500) Resp:  [10-22] 19  (06/04 0500) BP: (104-138)/(53-78) 116/62 mmHg (06/04 0500) SpO2:  [92 %-100 %] 98 % (06/04 0500)  Intake/Output from previous day: 06/03 0701 - 06/04 0700 In: 2653.3 [I.V.:2653.3] Out: 1000 [Urine:700; Blood:300] Intake/Output this shift:    Groin wd dressing intact.  Palp pulse.  Left foot warm with audible at signal.  Lab Results:  Basename 10/06/11 0420 10/05/11 1110  WBC 6.9 7.6  HGB 8.8* 9.6*  HCT 26.9* 28.9*  PLT 229 256   BMET  Basename 10/06/11 0420 10/05/11 1110  NA 136 138  K 4.5 4.1  CL 104 104  CO2 24 24  GLUCOSE 109* 108*  BUN 11 12  CREATININE 1.03 1.06  CALCIUM 8.4 8.9    Studies/Results: Dg Chest 1 View  09/27/2011  *RADIOLOGY REPORT*  Clinical Data: Chest pain and labored breathing.  CHEST - 1 VIEW  Comparison: 09/26/2011  Findings: Shallow inspiration.  Linear fibrosis or atelectasis in the lung bases, greater on the right.  No significant change since previous study.  Normal heart size and pulmonary vascularity.  The no focal airspace consolidation in the lungs.  No blunting of costophrenic angles.  No pneumothorax.  Degenerative changes in the spine.  IMPRESSION: Shallow inspiration with linear atelectasis or fibrosis in the lung bases.  Stable appearance since previous study.  Original Report Authenticated By: Marlon Pel, M.D.   Ir Ivc Filter Plmt / S&i /img Guid/mod Sed  10/03/2011  *RADIOLOGY REPORT*  Clinical Data:  Bilateral DVT, GI bleed, the patient cannot be anticoagulated  ULTRASOUND GUIDANCE FOR VASCULAR ACCESS IVC CATHETERIZATION AND VENOGRAM IVC FILTER INSERTION  Date:  10/03/2011 10:45:00  Radiologist:  Judie Petit. Ruel Favors, M.D.  Medications:  1% lidocaine locally  Guidance:  Ultrasound fluoroscopic   Fluoroscopy time:  0.7 minutes  Sedation time:  None.  Contrast Volume:  50 ml Omnipaque-300  Complications:  No immediate  PROCEDURE/FINDINGS:  Informed consent was obtained from the patient following explanation of the procedure, risks, benefits and alternatives. The patient understands, agrees and consents for the procedure. All questions were addressed.  A time out was performed.  Maximal barrier sterile technique utilized including caps, mask, sterile gowns, sterile gloves, large sterile drape, hand hygiene, and betadine prep.  Under sterile condition and local anesthesia, right internal jugular venous access was performed with ultrasound.  Over a guide wire, the IVC filter delivery sheath and inner dilator were advanced into the IVC just above the IVC bifurcation.  Contrast injection was performed for an IVC venogram.  IVC VENOGRAM:  The IVC is patent.  No evidence of thrombus, stenosis, or occlusion.  No variant venous anatomy.  The renal veins are identified at L2.  IVC FILTER INSERTION:  Through the delivery sheath, the Cordis TrapEase IVC filter was deployed in the infrarenal IVC at the L3 level just below the renal veins and above the IVC bifurcation. Contrast injection confirmed position.  There is good apposition of the filter against the IVC.  The delivery sheath was removed and hemostasis was obtained with compression for 5 minutes.  The patient tolerated the procedure well.  No immediate complications.  IMPRESSION: Ultrasound and fluoroscopically guided infrarenal IVC filter insertion.  Original Report Authenticated By: Judie Petit. Ruel Favors, M.D.   Dg  Chest Portable 1 View  09/26/2011  *RADIOLOGY REPORT*  Clinical Data: Chest burning, productive cough.  PORTABLE CHEST - 1 VIEW  Comparison: 07/28/2011  Findings: Shallow inspiration.  Linear fibrosis or atelectasis in the lung bases, greatest on the right.  This appears stable since the previous study.  Normal heart size and pulmonary vascularity. No  focal airspace consolidation in the lungs.  No blunting of costophrenic angles.  No pneumothorax.  IMPRESSION: Shallow inspiration with linear fibrosis or atelectasis in the lung bases, similar to previous study.  No focal consolidation.  Original Report Authenticated By: Marlon Pel, M.D.   Anti-infectives: Anti-infectives     Start     Dose/Rate Route Frequency Ordered Stop   10/05/11 1730   cefUROXime (ZINACEF) 1.5 g in dextrose 5 % 50 mL IVPB        1.5 g 100 mL/hr over 30 Minutes Intravenous Every 12 hours 10/05/11 1726 10/06/11 0547   10/05/11 0900   cefUROXime (ZINACEF) 1.5 g in dextrose 5 % 50 mL IVPB  Status:  Discontinued        1.5 g 100 mL/hr over 30 Minutes Intravenous 30 min pre-op 10/05/11 0726 10/05/11 1659   10/02/11 1320   ceFAZolin (ANCEF) 1-5 GM-% IVPB     Comments: TRAYWICK, CARL: cabinet override         10/02/11 1320 10/02/11 1320          Assessment/Plan: s/p Procedure(s) (LRB): BYPASS GRAFT FEMORAL-FEMORAL ARTERY (Bilateral) Stable POD 1.  Mobilize.  Plan per medicine service   LOS: 10 days   [name] 10/06/2011, 8:08 AM

## 2011-10-06 NOTE — Progress Notes (Signed)
Nutrition Follow-up  Pt transferred to [facility] for cath on 5/31. Thrombectomy of thrombosed femoral-femoral bypass performed 6/3.  Pt advanced to Heart Healthy diet on 6/1. Intake is variable 2/2 food preferences. Pt admits that he's a picky eater. Drinking Boost as scheduled. Discussed menu options to help provide more variety in intake, pt very appreciative of information.  Diet Order:  Heart Healthy Supplement: Boost BO BID  Meds: Scheduled Meds:   . aspirin  81 mg Oral Daily  . cefUROXime (ZINACEF)  IV  1.5 g Intravenous Q12H  . clopidogrel  75 mg Oral Q breakfast  . docusate sodium  100 mg Oral Daily  . isosorbide mononitrate  30 mg Oral Daily  . lactose free nutrition  237 mL Oral BID BM  . metoprolol  2.5 mg Intravenous Once  . metoprolol tartrate  100 mg Oral BID  . pantoprazole  40 mg Oral Q1200  . polyethylene glycol  17 g Oral Daily  . simvastatin  10 mg Oral q1800  . DISCONTD: cefUROXime (ZINACEF)  IV  1.5 g Intravenous 30 min Pre-Op  . DISCONTD: pantoprazole  40 mg Oral BID AC   Continuous Infusions:   . sodium chloride 125 mL/hr at 10/06/11 0518  . DISCONTD: sodium chloride 10 mL/hr at 10/05/11 0810  . DISCONTD: DOPamine    . DISCONTD: lactated ringers 50 mL/hr at 10/05/11 1252   PRN Meds:.sodium chloride, acetaminophen, acetaminophen, alum & mag hydroxide-simeth, bisacodyl, guaiFENesin-dextromethorphan, hydrALAZINE, HYDROcodone-homatropine, HYDROmorphone (DILAUDID) injection, labetalol, magnesium sulfate 1 - 4 g bolus IVPB, metoprolol, morphine injection, nitroGLYCERIN, ondansetron, phenol, potassium chloride, senna-docusate, DISCONTD: sodium chloride, DISCONTD: 0.9 % irrigation (POUR BTL), DISCONTD: alum & mag hydroxide-simeth DISCONTD: heparin 6000 unit irrigation, DISCONTD: ondansetron (ZOFRAN) IV  Labs:  CMP     Component Value Date/Time   NA 136 10/06/2011 0420   K 4.5 10/06/2011 0420   CL 104 10/06/2011 0420   CO2 24 10/06/2011 0420   GLUCOSE 109* 10/06/2011 0420   BUN 11 10/06/2011 0420   CREATININE 1.03 10/06/2011 0420   CALCIUM 8.4 10/06/2011 0420   PROT 6.4 10/05/2011 1110   ALBUMIN 2.7* 10/05/2011 1110   AST 11 10/05/2011 1110   ALT 8 10/05/2011 1110   ALKPHOS 60 10/05/2011 1110   BILITOT 0.3 10/05/2011 1110   GFRNONAA 68* 10/06/2011 0420   GFRAA 79* 10/06/2011 0420   CBG (last 3)  No results found for this basename: GLUCAP:3 in the last 72 hours   Intake/Output Summary (Last 24 hours) at 10/06/11 1133 Last data filed at 10/06/11 1100  Gross per 24 hour  Intake 3137.5 ml  Output   1000 ml  Net 2137.5 ml    Weight Status:  61 kg / 135 lb (stable)  Body mass index is 21.06 kg/(m^2). WNL  Estimated needs:  1500 - 1700 kcal, 62 - 67 grams protein, at least 1.7 liters/day  Nutrition Dx:  Inadequate oral intake now r/t food preferences AEB pt report.  Goals:   1. Positive tolerance of full liquid diet. Met. Resolved. 2. Diet advancement as medically able. Met. Resolved. 3. PO intake > 75% at meals and supplements. Improving. Continue. 4. Minimize weight loss. Met. Continue.  Intervention:  Continue Boost PO BID. Encouraged alternative menu items to increase variety in meals. RD to continue to follow nutrition care plan.  Monitor:  Diet advancement/ tolerance, weight trends, po intake, labs   Adair Laundry Pager #:  [phone]

## 2011-10-06 NOTE — Evaluation (Signed)
Physical Therapy Evaluation Patient Details Name: [name] MRN: [id number] DOB: Mar 25, 1934 Today's Date: 10/06/2011 Time: 4098-1191 PT Time Calculation (min): 12 min  PT Assessment / Plan / Recommendation Clinical Impression  Patient s/p GIB and thrombectomy  and fem to fem BPG left LE.  Will benefit from PT to address endurance and strength issues.      PT Assessment  Patient needs continued PT services    Follow Up Recommendations  Skilled nursing facility;Supervision/Assistance - 24 hour    Barriers to Discharge Decreased caregiver support      lEquipment Recommendations  Defer to next venue    Recommendations for Other Services   None  Frequency Min 3X/week    Precautions / Restrictions Precautions Precautions: Fall Restrictions Weight Bearing Restrictions: No   Pertinent Vitals/Pain VSS, No pain      Mobility  Bed Mobility Bed Mobility: Rolling Right;Rolling Left;Right Sidelying to Sit;Sitting - Scoot to Edge of Bed Rolling Right: 3: Mod assist Rolling Left: 3: Mod assist Right Sidelying to Sit: 1: +2 Total assist;HOB flat;With rails Right Sidelying to Sit: Patient Percentage: 40% Sitting - Scoot to Edge of Bed: 3: Mod assist Details for Bed Mobility Assistance: extensive A needed to mobilize L leg off the bed and to elevate trunk.  Min cues for hand placement and for sequencing.   Transfers Transfers: Sit to Stand;Stand to Sit Sit to Stand: 1: +2 Total assist;From elevated surface;With upper extremity assist;From bed Sit to Stand: Patient Percentage: 40% Stand to Sit: 1: +2 Total assist;With upper extremity assist;To bed;To elevated surface Stand to Sit: Patient Percentage: 40% Details for Transfer Assistance: min vcs for hand placement, assist needed to rise from bed and stabilize self.  L knee with tendency to buckle.  Ambulation/Gait Ambulation/Gait Assistance: Not tested (comment) Stairs: No Wheelchair Mobility Wheelchair Mobility: No    Exercises  General Exercises - Lower Extremity Long Arc Quad: AROM;Both;10 reps;Seated Heel Slides: AROM;Both;10 reps;Supine   PT Diagnosis: Generalized weakness  PT Problem List: Decreased balance;Decreased mobility;Decreased safety awareness;Decreased knowledge of precautions;Decreased knowledge of use of DME;Decreased activity tolerance;Decreased strength PT Treatment Interventions: DME instruction;Gait training;Functional mobility training;Therapeutic activities;Therapeutic exercise;Balance training;Patient/family education   PT Goals Acute Rehab PT Goals PT Goal Formulation: With patient Time For Goal Achievement: 10/20/11 Potential to Achieve Goals: Good Pt will go Supine/Side to Sit: with supervision PT Goal: Supine/Side to Sit - Progress: Goal set today Pt will Sit at Edge of Bed: with supervision;6-10 min;with bilateral upper extremity support PT Goal: Sit at Edge Of Bed - Progress: Goal set today Pt will go Sit to Stand: with mod assist;with upper extremity assist PT Goal: Sit to Stand - Progress: Goal set today Pt will Transfer Bed to Chair/Chair to Bed: with mod assist PT Transfer Goal: Bed to Chair/Chair to Bed - Progress: Goal set today Pt will Ambulate: 16 - 50 feet;with least restrictive assistive device;with +2 total assist (pt = 60%) PT Goal: Ambulate - Progress: Goal set today  Visit Information  Last PT Received On: 10/06/11 Assistance Needed: +2 PT/OT Co-Evaluation/Treatment: Yes    Subjective Data  Subjective: "I have been through alot." Patient Stated Goal: To go back to the NH and finish my therapy.    Prior Functioning  Home Living Lives With: Other (Comment) Available Help at Discharge: Skilled Nursing Facility Additional Comments: Patient was receiving rehab at [facility] for ~ 6 weeks PTA.  Plan is to return at d/c.   Prior Function Level of Independence: Needs assistance Bath: Maximal Dressing: Maximal  Toileting: Maximal Comments: Prior to patient's hip fx in  Feb, pt was I with all ADLs.  Now needs extensive assist with selfcare and mobility.   Communication Communication: No difficulties Dominant Hand: Right    Cognition  Overall Cognitive Status: Appears within functional limits for tasks assessed/performed Arousal/Alertness: Awake/alert Orientation Level: Appears intact for tasks assessed Behavior During Session: [facility] for tasks performed    Extremity/Trunk Assessment Right Upper Extremity Assessment RUE ROM/Strength/Tone: WFL for tasks assessed RUE Sensation: WFL - Light Touch RUE Coordination: WFL - gross/fine motor Left Upper Extremity Assessment LUE ROM/Strength/Tone: Deficits LUE ROM/Strength/Tone Deficits: See OT note LUE Sensation: WFL - Light Touch LUE Coordination: Deficits LUE Coordination Deficits: from CVA in Feb of 2012 Right Lower Extremity Assessment RLE ROM/Strength/Tone: [facility] for tasks assessed RLE Sensation: WFL - Light Touch RLE Coordination: WFL - gross/fine motor Left Lower Extremity Assessment LLE ROM/Strength/Tone: Deficits LLE ROM/Strength/Tone Deficits: 3-/5 hip, 3+/5 knee and ankle 3/5.   LLE Sensation: WFL - Light Touch LLE Coordination: Deficits Trunk Assessment Trunk Assessment: Kyphotic   Balance Balance Balance Assessed: Yes Static Sitting Balance Static Sitting - Balance Support: Bilateral upper extremity supported;Feet supported Static Sitting - Level of Assistance: 5: Stand by assistance  End of Session PT - End of Session Equipment Utilized During Treatment: Gait belt Activity Tolerance: Patient tolerated treatment well Patient left: in bed;with call bell/phone within reach Nurse Communication: Mobility status   INGOLD,Ulysses Alper 10/06/2011, 2:24 PM  [facility] Acute Rehabilitation [phone] (pager)

## 2011-10-06 NOTE — Evaluation (Signed)
Occupational Therapy Evaluation Patient Details Name: [name] MRN: [id number] DOB: February 01, 1934 Today's Date: 10/06/2011 Time: 6433-2951 OT Time Calculation (min): 15 min  OT Assessment / Plan / Recommendation Clinical Impression  Pt is a 78 yo male well known to this OT from previous admission to [facility]. Pt presents with thrombectomy of right to left fem-fem bypass. Pt also has a L hip fx, CVA from Feb '12 complicated by SBO. Skilled OT recommended to maximize I w/BADLs to min-mod A level in prep for d/c to next venue of care.    OT Assessment  Patient needs continued OT Services    Follow Up Recommendations  Skilled nursing facility    Barriers to Discharge Inaccessible home environment;Decreased caregiver support    Equipment Recommendations  Defer to next venue    Recommendations for Other Services    Frequency  Min 1X/week    Precautions / Restrictions Precautions Precautions: Fall   Pertinent Vitals/Pain     ADL  Grooming: Simulated;Minimal assistance Where Assessed - Grooming: Unsupported sitting Upper Body Bathing: Simulated;Moderate assistance Where Assessed - Upper Body Bathing: Unsupported sitting Lower Body Bathing: Simulated;+2 Total assistance Lower Body Bathing: Patient Percentage: 0% Where Assessed - Lower Body Bathing: Supported sit to stand Upper Body Dressing: Simulated;Moderate assistance Where Assessed - Upper Body Dressing: Unsupported sitting Lower Body Dressing: Simulated;+2 Total assistance Lower Body Dressing: Patient Percentage: 0% Where Assessed - Lower Body Dressing: Sopported sit to stand ADL Comments: Pt fatigues quickly. Declined sitting up in chair. Stated he had already been up. HR up to 133 with mobility.    OT Diagnosis: Generalized weakness  OT Problem List: Decreased strength;Decreased coordination;Decreased activity tolerance;Decreased safety awareness;Decreased knowledge of use of DME or AE;Impaired balance (sitting and/or  standing);Impaired UE functional use OT Treatment Interventions: Self-care/ADL training;Therapeutic activities;Therapeutic exercise;DME and/or AE instruction;Patient/family education;Balance training   OT Goals Acute Rehab OT Goals OT Goal Formulation: With patient Time For Goal Achievement: 10/20/11 Potential to Achieve Goals: Good ADL Goals Pt Will Perform Grooming: with set-up;Unsupported;Sitting, edge of bed (x 3 tasks to improve activtiy tolerance.) ADL Goal: Grooming - Progress: Goal set today Pt Will Perform Upper Body Bathing: with min assist;Sitting, edge of bed;Sitting, chair;Unsupported ADL Goal: Upper Body Bathing - Progress: Goal set today Pt Will Perform Upper Body Dressing: with min assist;Sitting, bed;Sitting, chair;Supported ADL Goal: Patent attorney - Progress: Goal set today Pt Will Transfer to Toilet: with mod assist;3-in-1;Stand pivot transfer ADL Goal: Toilet Transfer - Progress: Goal set today Pt Will Perform Toileting - Clothing Manipulation: with mod assist;Sitting on 3-in-1 or toilet;Standing ADL Goal: Toileting - Clothing Manipulation - Progress: Goal set today Pt Will Perform Toileting - Hygiene: with mod assist;Sit to stand from 3-in-1/toilet ADL Goal: Toileting - Hygiene - Progress: Goal set today Arm Goals Pt Will Complete Theraband Exer: with supervision, verbal cues required/provided;to increase strength;Left upper extremity;1 set;15 reps;Level 1 Theraband Arm Goal: Theraband Exercises - Progress: Goal set today Miscellaneous OT Goals Miscellaneous OT Goal #1: Pt will complete supine<>sit with supervision in prep for seated ADL. OT Goal: Miscellaneous Goal #1 - Progress: Goal set today  Visit Information  Last OT Received On: 10/06/11 Assistance Needed: +2 PT/OT Co-Evaluation/Treatment: Yes    Subjective Data  Subjective: Im going back to get some more rehab Patient Stated Goal: Walk   Prior Functioning  Home Living Lives With: Other  (Comment) Available Help at Discharge: Skilled Nursing Facility Additional Comments: Pt was receiving rehab at snf for ~6 weeks PTA. Plan is to return  at d/c. Prior Function Level of Independence: Needs assistance Comments: Prior to pt's hip fx in Feb, pt was I w/all ADLs. Now needs extensive A with selfcare and mobility. Communication Communication: No difficulties Dominant Hand: Right    Cognition  Overall Cognitive Status: Appears within functional limits for tasks assessed/performed Arousal/Alertness: Awake/alert Orientation Level: Appears intact for tasks assessed Behavior During Session: [facility] for tasks performed    Extremity/Trunk Assessment Right Upper Extremity Assessment RUE ROM/Strength/Tone: WFL for tasks assessed RUE Sensation: WFL - Light Touch RUE Coordination: WFL - gross/fine motor Left Upper Extremity Assessment LUE ROM/Strength/Tone: Deficits LUE ROM/Strength/Tone Deficits: 3/5 at shoulder, 3+/5 distally. Weak grip LUE Sensation: WFL - Light Touch LUE Coordination: Deficits LUE Coordination Deficits: from CVA in Febuary of 2012   Mobility Bed Mobility Bed Mobility: Rolling Right;Rolling Left Rolling Right: 3: Mod assist Rolling Left: 3: Mod assist Right Sidelying to Sit: 1: +2 Total assist;HOB flat;With rails Right Sidelying to Sit: Patient Percentage: 40% Details for Bed Mobility Assistance: extensive A needed to mobilize L leg off the bed and with trunk. Min cues for hand placement, sequencing. Transfers Transfers: Sit to Stand;Stand to Sit Sit to Stand: 1: +2 Total assist;With upper extremity assist;From bed Sit to Stand: Patient Percentage: 40% Stand to Sit: 1: +2 Total assist;With upper extremity assist;To bed Stand to Sit: Patient Percentage: 40% Details for Transfer Assistance: min VCs for hand placement, assist needed to rise from bed and stabilize self. L knee with a tendency to buckle.   Exercise    Balance Balance Balance Assessed: Yes Static  Sitting Balance Static Sitting - Balance Support: Bilateral upper extremity supported;Feet supported Static Sitting - Level of Assistance: 5: Stand by assistance  End of Session OT - End of Session Activity Tolerance: Patient limited by fatigue Patient left: in bed;with call bell/phone within reach   Brilyn Tuller A OTR/L [phone] 10/06/2011, 1:45 PM

## 2011-10-07 DIAGNOSIS — Z48812 Encounter for surgical aftercare following surgery on the circulatory system: Secondary | ICD-10-CM

## 2011-10-07 DIAGNOSIS — I429 Cardiomyopathy, unspecified: Secondary | ICD-10-CM | POA: Diagnosis present

## 2011-10-07 LAB — CBC
Hemoglobin: 9.8 g/dL — ABNORMAL LOW (ref 13.0–17.0)
MCHC: 33.3 g/dL (ref 30.0–36.0)
RDW: 15.8 % — ABNORMAL HIGH (ref 11.5–15.5)
WBC: 6.9 10*3/uL (ref 4.0–10.5)

## 2011-10-07 NOTE — Progress Notes (Signed)
Subjective: Patient had an uneventful night, he tolerated blood transfusion. There is no complaint of chest pain however he does complain of some discomfort in his left foot. Nurses state they haven't been able to Doppler pulse. PT OT  Objective: Vital signs in last 24 hours: Temp:  [97.6 F (36.4 C)-99.4 F (37.4 C)] 98.1 F (36.7 C) (06/05 0400) Pulse Rate:  [91-118] 91  (06/05 0000) Resp:  [20-30] 21  (06/05 0400) BP: (101-143)/(52-81) 134/81 mmHg (06/05 0400) SpO2:  [94 %-100 %] 94 % (06/05 0400) Weight:  [67.2 kg (148 lb 2.4 oz)] 67.2 kg (148 lb 2.4 oz) (06/05 0000) Weight change:  Last BM Date: 10/06/11  Intake/Output from previous day: 06/04 0701 - 06/05 0700 In: 3680 [P.O.:1080; I.V.:2250; Blood:350] Out: 1900 [Urine:1900] Intake/Output this shift:    General appearance: alert and cooperative Resp: clear to auscultation bilaterally Cardio: Sinus tach Extremities: No clubbing cyanosis or edema, left foot warm, well perfused, difficulty palpating pulse Neurologic: Motor: Left hemiparesis  Lab Results:  No results found for this or any previous visit (from the past 24 hour(s)).    Studies/Results: No results found.  Medications:  Prior to Admission:  Prescriptions prior to admission  Medication Sig Dispense Refill  . clopidogrel (PLAVIX) 75 MG tablet Take 75 mg by mouth daily.      Marland Kitchen diltiazem (DILACOR XR) 180 MG 24 hr capsule Take 180 mg by mouth daily.      Marland Kitchen lactose free nutrition (BOOST PLUS) LIQD Take 237 mLs by mouth 3 (three) times daily with meals.      . metoCLOPramide (REGLAN) 10 MG tablet Take 10 mg by mouth 3 (three) times daily.      . metoprolol (LOPRESSOR) 50 MG tablet Take 1 tablet (50 mg total) by mouth 2 (two) times daily.      . Multiple Vitamin (MULITIVITAMIN WITH MINERALS) TABS Take 1 tablet by mouth daily.      . NON FORMULARY 30 mLs 2 (two) times daily. Provide Gold Regular Liquid - for wound healing.      . pantoprazole (PROTONIX) 40 MG  tablet Take 40 mg by mouth daily.      . simvastatin (ZOCOR) 10 MG tablet Take 10 mg by mouth daily.      . Tamsulosin HCl (FLOMAX) 0.4 MG CAPS Take 0.4 mg by mouth daily.      . vitamin C (ASCORBIC ACID) 500 MG tablet Take 500 mg by mouth 2 (two) times daily.      Marland Kitchen warfarin (COUMADIN) 10 MG tablet Take 10 mg by mouth daily.      . metoCLOPramide (REGLAN) 10 MG tablet Take 1 tablet (10 mg total) by mouth 3 (three) times daily before meals.       Scheduled:   . aspirin  81 mg Oral Daily  . clopidogrel  75 mg Oral Q breakfast  . docusate sodium  100 mg Oral Daily  . isosorbide mononitrate  30 mg Oral Daily  . lactose free nutrition  237 mL Oral BID BM  . metoprolol  2.5 mg Intravenous Once  . metoprolol tartrate  100 mg Oral BID  . pantoprazole  40 mg Oral Q1200  . polyethylene glycol  17 g Oral Daily  . simvastatin  10 mg Oral q1800   Continuous:   . sodium chloride 125 mL/hr at 10/07/11 0147  . DISCONTD: DOPamine      Assessment/Plan: Status post thrombectomy of occluded right to left fem-fem bypass. Await further evaluation by  vascular  Coronary artery disease, cardiomyopathy, status post non-ST elevation MI status post stent LAD and circumflex . Patient still running sinus tach, historically he has ran sinus tach when hospitalized Admitted with GI bleed hemoglobin 7 on admission requiring transfusion,  EGD showing esophagitis and duodenitis. No recurrent signs of bleeding check followup H&H today History of CVA with left hemiparesis  History of PE DVT, now status post Greenfield filter  BPH with history of outlet obstruction  History of prolonged bout of ileus/l small bowel obstruction, requiring lysis of adhesion, current abdominal exam unremarkable  History of chronic kidney disease, current creatinine stable   LOS: 11 days   Carla Whilden D 10/07/2011, 8:05 AM

## 2011-10-07 NOTE — Progress Notes (Signed)
Subjective: Interval History: none..   Objective: Vital signs in last 24 hours: Temp:  [97.5 F (36.4 C)-99.4 F (37.4 C)] 98.2 F (36.8 C) (06/05 1225) Pulse Rate:  [91-113] 113  (06/05 1025) Resp:  [20-22] 21  (06/05 0400) BP: (115-143)/(56-81) 123/72 mmHg (06/05 1225) SpO2:  [94 %-96 %] 94 % (06/05 0400) Weight:  [148 lb 2.4 oz (67.2 kg)] 148 lb 2.4 oz (67.2 kg) (06/05 0000)  Intake/Output from previous day: 06/04 0701 - 06/05 0700 In: 3680 [P.O.:1080; I.V.:2250; Blood:350] Out: 1900 [Urine:1900] Intake/Output this shift:    PE.  Left groin healing,foot warm with at signal  Lab Results:  [facility] 10/07/11 0851 10/06/11 0420  WBC 6.9 6.9  HGB 9.8* 8.8*  HCT 29.4* 26.9*  PLT 238 229   BMET  Basename 10/06/11 0420 10/05/11 1110  NA 136 138  K 4.5 4.1  CL 104 104  CO2 24 24  GLUCOSE 109* 108*  BUN 11 12  CREATININE 1.03 1.06  CALCIUM 8.4 8.9    Studies/Results: Dg Chest 1 View  09/27/2011  *RADIOLOGY REPORT*  Clinical Data: Chest pain and labored breathing.  CHEST - 1 VIEW  Comparison: 09/26/2011  Findings: Shallow inspiration.  Linear fibrosis or atelectasis in the lung bases, greater on the right.  No significant change since previous study.  Normal heart size and pulmonary vascularity.  The no focal airspace consolidation in the lungs.  No blunting of costophrenic angles.  No pneumothorax.  Degenerative changes in the spine.  IMPRESSION: Shallow inspiration with linear atelectasis or fibrosis in the lung bases.  Stable appearance since previous study.  Original Report Authenticated By: Marlon Pel, M.D.   Ir Ivc Filter Plmt / S&i /img Guid/mod Sed  10/03/2011  *RADIOLOGY REPORT*  Clinical Data:  Bilateral DVT, GI bleed, the patient cannot be anticoagulated  ULTRASOUND GUIDANCE FOR VASCULAR ACCESS IVC CATHETERIZATION AND VENOGRAM IVC FILTER INSERTION  Date:  10/03/2011 10:45:00  Radiologist:  Judie Petit. Ruel Favors, M.D.  Medications:  1% lidocaine locally  Guidance:   Ultrasound fluoroscopic  Fluoroscopy time:  0.7 minutes  Sedation time:  None.  Contrast Volume:  50 ml Omnipaque-300  Complications:  No immediate  PROCEDURE/FINDINGS:  Informed consent was obtained from the patient following explanation of the procedure, risks, benefits and alternatives. The patient understands, agrees and consents for the procedure. All questions were addressed.  A time out was performed.  Maximal barrier sterile technique utilized including caps, mask, sterile gowns, sterile gloves, large sterile drape, hand hygiene, and betadine prep.  Under sterile condition and local anesthesia, right internal jugular venous access was performed with ultrasound.  Over a guide wire, the IVC filter delivery sheath and inner dilator were advanced into the IVC just above the IVC bifurcation.  Contrast injection was performed for an IVC venogram.  IVC VENOGRAM:  The IVC is patent.  No evidence of thrombus, stenosis, or occlusion.  No variant venous anatomy.  The renal veins are identified at L2.  IVC FILTER INSERTION:  Through the delivery sheath, the Cordis TrapEase IVC filter was deployed in the infrarenal IVC at the L3 level just below the renal veins and above the IVC bifurcation. Contrast injection confirmed position.  There is good apposition of the filter against the IVC.  The delivery sheath was removed and hemostasis was obtained with compression for 5 minutes.  The patient tolerated the procedure well.  No immediate complications.  IMPRESSION: Ultrasound and fluoroscopically guided infrarenal IVC filter insertion.  Original Report Authenticated By:  M. Ruel Favors, M.D.   Dg Chest Portable 1 View  09/26/2011  *RADIOLOGY REPORT*  Clinical Data: Chest burning, productive cough.  PORTABLE CHEST - 1 VIEW  Comparison: 07/28/2011  Findings: Shallow inspiration.  Linear fibrosis or atelectasis in the lung bases, greatest on the right.  This appears stable since the previous study.  Normal heart size and  pulmonary vascularity. No focal airspace consolidation in the lungs.  No blunting of costophrenic angles.  No pneumothorax.  IMPRESSION: Shallow inspiration with linear fibrosis or atelectasis in the lung bases, similar to previous study.  No focal consolidation.  Original Report Authenticated By: Marlon Pel, M.D.   Anti-infectives: Anti-infectives     Start     Dose/Rate Route Frequency Ordered Stop   10/05/11 1730   cefUROXime (ZINACEF) 1.5 g in dextrose 5 % 50 mL IVPB        1.5 g 100 mL/hr over 30 Minutes Intravenous Every 12 hours 10/05/11 1726 10/06/11 0547   10/05/11 0900   cefUROXime (ZINACEF) 1.5 g in dextrose 5 % 50 mL IVPB  Status:  Discontinued        1.5 g 100 mL/hr over 30 Minutes Intravenous 30 min pre-op 10/05/11 0726 10/05/11 1659   10/02/11 1320   ceFAZolin (ANCEF) 1-5 GM-% IVPB     Comments: TRAYWICK, CARL: cabinet override         10/02/11 1320 10/02/11 1320          Assessment/Plan: s/p Procedure(s) (LRB): BYPASS GRAFT FEMORAL-FEMORAL ARTERY (Bilateral) Stable POD2,  Mobilize prn   LOS: 11 days   Shelva Hetzer 10/07/2011, 2:07 PM

## 2011-10-07 NOTE — Progress Notes (Signed)
VASCULAR LAB PRELIMINARY  ARTERIAL  ABI completed:    RIGHT    LEFT    PRESSURE WAVEFORM  PRESSURE WAVEFORM  BRACHIAL 137 Tri BRACHIAL 149 Tri  DP 55 mono DP 49 Damp mono  AT   AT    PT   PT 49 Damp mono  PER 79 mono PER    GREAT TOE  NA GREAT TOE  NA    RIGHT LEFT  ABI 0.53, moderate disease 0.33, severe disease     Farrel Demark, RDMS 10/07/2011, 10:54 AM

## 2011-10-07 NOTE — Progress Notes (Signed)
Subjective:  No chest pain. Left foot to knee aches - had dopplers done this AM - awaiting results.  Objective:  Vital Signs in the last 24 hours: Temp:  [97.6 F (36.4 C)-99.4 F (37.4 C)] 98.1 F (36.7 C) (06/05 0400) Pulse Rate:  [91-118] 91  (06/05 0000) Resp:  [20-27] 21  (06/05 0400) BP: (101-143)/(52-81) 134/81 mmHg (06/05 0400) SpO2:  [94 %-97 %] 94 % (06/05 0400) Weight:  [67.2 kg (148 lb 2.4 oz)] 67.2 kg (148 lb 2.4 oz) (06/05 0000)  Intake/Output from previous day:  Intake/Output Summary (Last 24 hours) at 10/07/11 0903 Last data filed at 10/07/11 0600  Gross per 24 hour  Intake   3070 ml  Output   1900 ml  Net   1170 ml    Physical Exam: General appearance: alert, cooperative and no distress Lungs: decreased breath sounds Heart: regular rate and rhythm ABd: soft, NT, ND, NABS Ext: faint, barely palpable L DP/ PT pulses   Rate: 115 - 89   Rhythm: sinus tachycardia - SR  Lab Results:  Basename 10/06/11 0420 10/05/11 1110  WBC 6.9 7.6  HGB 8.8* 9.6*  PLT 229 256    Basename 10/06/11 0420 10/05/11 1110  NA 136 138  K 4.5 4.1  CL 104 104  CO2 24 24  GLUCOSE 109* 108*  BUN 11 12  CREATININE 1.03 1.06   No results found for this basename: TROPONINI:2,CK,MB:2 in the last 72 hours Hepatic Function Panel  Basename 10/05/11 1110  PROT 6.4  ALBUMIN 2.7*  AST 11  ALT 8  ALKPHOS 60  BILITOT 0.3  BILIDIR --  IBILI --   No results found for this basename: CHOL in the last 72 hours  Basename 10/05/11 0655  INR 1.16    Imaging: Imaging results have been reviewed  Cardiac Studies:  Assessment/Plan:   Principal Problem:  *GI bleed, endo 09/28/11 esophagitis, duodenitis. Coumadin discontinued Active Problems:  PVD, thrombosed R-L BPG, S/P thrombectomy 10/05/11  NSTEMI, BMS to CFX/LAD  10/03/11  PE (pulmonary embolism) July 2012, IVC filter placed 10/03/11  Chronic anticoagulation, stopped secondary to GI bleeding  DVT 3/13  Cardiomyopathy, EF  40-50% 2D 09/27/11  Anemia, transfused this admission.  CAD (coronary artery disease)  Sinus tachycardia  Fracture of hip, right, surg -Feb 2013  SBO (small bowel obstruction), lysis of adhesions Feb 2013  CVA, Rt brain Feb 2013  History of GI bleed Feb 2013  History of renal insufficiency syndrome, 7/12 and 3/13- SCr Nl (1.0) now  BPH (benign prostatic hypertrophy) with urinary retention  Cardiomyopathy, EF 40-50% 2D   Plan- stable from cardiac standpoint, on ASA 81mg  and Plavix s/p BMS 10/03/11. Tachycardia persist despite beta blocker.  [name] Shelter PA-C 10/07/2011, 9:03 AM  I have seen & examined [name] this AM, reviewed the chart. I agree with [name] note (& exam above)  He is stable from a Cardiac standpoint - BP looks fine & chronic Sinus Tachycardia persists - titrating BB dose. On statin, ASA & Plavix. IVC filter in place - off warfarin  GI bld - Hgb stable  Will await results of dopplers of LLE & Vasc Sgx assessment.  Continue PT/OT & move towards d/c to SNF.  Marykay Lex, M.D., M.S. [facility] [address] [place], [place]  [address]  [phone] Pager # [phone]  10/07/2011 1:10 PM

## 2011-10-08 LAB — TYPE AND SCREEN: Unit division: 0

## 2011-10-08 NOTE — Progress Notes (Addendum)
Subjective: There have been no complications overnight, patient tolerated transfusion well, good response in hemoglobin, no signs of bleeding. Still remains sinus tach which is not new. There is no complaint of chest pain. Today there is no complaint of leg pain. Vascular studies reviewed, still shows decreased flow, severe disease however patient has moderate disease also on the right. His foot is warm, well-perfused  Objective: Vital signs in last 24 hours: Temp:  [97.5 F (36.4 C)-98.6 F (37 C)] 98.6 F (37 C) (06/06 0730) Pulse Rate:  [89-117] 107  (06/06 0730) Resp:  [11-27] 17  (06/06 0730) BP: (115-151)/(54-76) 141/60 mmHg (06/06 0730) SpO2:  [92 %-100 %] 99 % (06/06 0730) Weight:  [69.1 kg (152 lb 5.4 oz)] 69.1 kg (152 lb 5.4 oz) (06/06 0145) Weight change: 1.9 kg (4 lb 3 oz) Last BM Date: 10/07/11  Intake/Output from previous day: 06/05 0701 - 06/06 0700 In: 120 [P.O.:120] Out: 2675 [Urine:2675] Intake/Output this shift:    General appearance: alert and cooperative Resp: clear to auscultation bilaterally Cardio: Sinus tach Extremities: Warm well perfused, reduce pulse  Lab Results:  Results for orders placed during the hospital encounter of 09/26/11 (from the past 24 hour(s))  CBC     Status: Abnormal   Collection Time   10/07/11  8:51 AM      Component Value Range   WBC 6.9  4.0 - 10.5 (K/uL)   RBC 3.44 (*) 4.22 - 5.81 (MIL/uL)   Hemoglobin 9.8 (*) 13.0 - 17.0 (g/dL)   HCT 16.1 (*) 09.6 - 52.0 (%)   MCV 85.5  78.0 - 100.0 (fL)   MCH 28.5  26.0 - 34.0 (pg)   MCHC 33.3  30.0 - 36.0 (g/dL)   RDW 04.5 (*) 40.9 - 15.5 (%)   Platelets 238  150 - 400 (K/uL)      Studies/Results: No results found.  Medications:  Prior to Admission:  Prescriptions prior to admission  Medication Sig Dispense Refill  . clopidogrel (PLAVIX) 75 MG tablet Take 75 mg by mouth daily.      Marland Kitchen diltiazem (DILACOR XR) 180 MG 24 hr capsule Take 180 mg by mouth daily.      Marland Kitchen lactose free  nutrition (BOOST PLUS) LIQD Take 237 mLs by mouth 3 (three) times daily with meals.      . metoCLOPramide (REGLAN) 10 MG tablet Take 10 mg by mouth 3 (three) times daily.      . metoprolol (LOPRESSOR) 50 MG tablet Take 1 tablet (50 mg total) by mouth 2 (two) times daily.      . Multiple Vitamin (MULITIVITAMIN WITH MINERALS) TABS Take 1 tablet by mouth daily.      . NON FORMULARY 30 mLs 2 (two) times daily. Provide Gold Regular Liquid - for wound healing.      . pantoprazole (PROTONIX) 40 MG tablet Take 40 mg by mouth daily.      . simvastatin (ZOCOR) 10 MG tablet Take 10 mg by mouth daily.      . Tamsulosin HCl (FLOMAX) 0.4 MG CAPS Take 0.4 mg by mouth daily.      . vitamin C (ASCORBIC ACID) 500 MG tablet Take 500 mg by mouth 2 (two) times daily.      Marland Kitchen warfarin (COUMADIN) 10 MG tablet Take 10 mg by mouth daily.      . metoCLOPramide (REGLAN) 10 MG tablet Take 1 tablet (10 mg total) by mouth 3 (three) times daily before meals.  Scheduled:   . aspirin  81 mg Oral Daily  . clopidogrel  75 mg Oral Q breakfast  . docusate sodium  100 mg Oral Daily  . isosorbide mononitrate  30 mg Oral Daily  . lactose free nutrition  237 mL Oral BID BM  . metoprolol  2.5 mg Intravenous Once  . metoprolol tartrate  100 mg Oral BID  . pantoprazole  40 mg Oral Q1200  . polyethylene glycol  17 g Oral Daily  . simvastatin  10 mg Oral q1800   Continuous:   . sodium chloride 125 mL/hr at 10/07/11 0147    Assessment/Plan: Status post thrombectomy of occluded right to left fem-fem bypass. Patient doing well, vascular studies as discussed above.  Coronary artery disease, cardiomyopathy, status post non-ST elevation MI status post stent LAD and circumflex . Patient still running sinus tach, historically he has ran sinus tach when hospitalized  Admitted with GI bleed hemoglobin 7 on admission requiring transfusion, EGD showing esophagitis and duodenitis. No recurrent signs of bleeding  History of CVA with left  hemiparesis  History of PE DVT, now status post Greenfield filter  BPH with history of outlet obstruction  History of prolonged bout of ileus/l small bowel obstruction, requiring lysis of adhesion, current abdominal exam unremarkable  History of chronic kidney disease, current creatinine stable  Start disposition planning, case manager will be consult.   LOS: 12 days   [name] D 10/08/2011, 8:23 AM

## 2011-10-08 NOTE — Progress Notes (Signed)
Physical Therapy Treatment Patient Details Name: [name] [name] [name] MRN: [id number] DOB: 1933-06-28 Today's Date: 10/08/2011 Time: 0865-7846 PT Time Calculation (min): 18 min  PT Assessment / Plan / Recommendation Comments on Treatment Session  Patient s/p GIB, MI with Stents, IVC filter, thrombectomy right to left femoral femoral bypass 6/3.  Will benefit from PT to address mobility issues and progress as able.      Follow Up Recommendations  Skilled nursing facility;Supervision/Assistance - 24 hour    Barriers to Discharge  None      Equipment Recommendations  Defer to next venue    Recommendations for Other Services  None  Frequency Min 3X/week   Plan Discharge plan remains appropriate;Frequency remains appropriate    Precautions / Restrictions Precautions Precautions: Fall Restrictions Weight Bearing Restrictions: No   Pertinent Vitals/Pain VSS, No pain    Mobility  Bed Mobility Bed Mobility: Rolling Right;Right Sidelying to Sit;Sitting - Scoot to [place] of Bed Rolling Right: 3: Mod assist Rolling Left: Not tested (comment) Right Sidelying to Sit: 3: Mod assist;With rails;HOB flat Sitting - Scoot to Edge of Bed: 3: Mod assist Details for Bed Mobility Assistance: Less assist needed to move LLE off bed.  Assist needed to elevate trunk.  Min cues for hand placement and sequencing.   Transfers Transfers: Sit to Stand;Stand to Sit;Squat Pivot Transfers Sit to Stand: 1: +2 Total assist;With upper extremity assist;From elevated surface;From bed Sit to Stand: Patient Percentage: 50% Stand to Sit: 1: +2 Total assist;With upper extremity assist;To elevated surface;To chair/3-in-1 Stand to Sit: Patient Percentage: 50% Squat Pivot Transfers: 1: +2 Total assist;With upper extremity assistance;With armrests Squat Pivot Transfers: Patient Percentage: 50% Details for Transfer Assistance: Min vcs for hand placement, assist needed to rise from bed and stabilize self.  L knee continues to have  tendency to buckle.  Beared weight good enough to pivot to recliner.   Ambulation/Gait Ambulation/Gait Assistance: Not tested (comment) Stairs: No Wheelchair Mobility Wheelchair Mobility: No    Exercises General Exercises - Lower Extremity Ankle Circles/Pumps: AROM;Both;5 reps;Seated Long Arc Quad: AROM;Both;5 reps;Seated    PT Goals Acute Rehab PT Goals PT Goal: Supine/Side to Sit - Progress: Progressing toward goal PT Goal: Sit at [facility] Of Bed - Progress: Progressing toward goal PT Goal: Sit to Stand - Progress: Progressing toward goal PT Transfer Goal: Bed to Chair/Chair to Bed - Progress: Progressing toward goal  Visit Information  Last PT Received On: 10/08/11 Assistance Needed: +2    Subjective Data  Subjective: "I am so comfortable in this chair."   Cognition  Overall Cognitive Status: Appears within functional limits for tasks assessed/performed Arousal/Alertness: Awake/alert Orientation Level: Appears intact for tasks assessed Behavior During Session: [facility] for tasks performed    Balance  Static Sitting Balance Static Sitting - Balance Support: Bilateral upper extremity supported;Feet supported Static Sitting - Level of Assistance: 5: Stand by assistance  End of Session PT - End of Session Equipment Utilized During Treatment: Gait belt Activity Tolerance: Patient tolerated treatment well Patient left: in chair;with call bell/phone within reach Nurse Communication: Mobility status    [name],[name] [name] [name] [name] 10/08/2011, 10:14 AM  [name] [name] [name] Acute Rehabilitation [phone] (pager)

## 2011-10-08 NOTE — Progress Notes (Signed)
Subjective: Interval History: none..   Objective: Vital signs in last 24 hours: Temp:  [97.5 F (36.4 C)-98.6 F (37 C)] 98.6 F (37 C) (06/06 0730) Pulse Rate:  [89-117] 107  (06/06 0730) Resp:  [11-27] 17  (06/06 0730) BP: (115-151)/(54-76) 141/60 mmHg (06/06 0730) SpO2:  [92 %-100 %] 99 % (06/06 0730) Weight:  [152 lb 5.4 oz (69.1 kg)] 152 lb 5.4 oz (69.1 kg) (06/06 0145)  Intake/Output from previous day: 06/05 0701 - 06/06 0700 In: 120 [P.O.:120] Out: 2675 [Urine:2675] Intake/Output this shift:    Left groin healing well. 2+ right-to-left fem-fem graft pulse. Still with some numbness and aching in his left foot which is well perfused  Lab Results:  Basename 10/07/11 0851 10/06/11 0420  WBC 6.9 6.9  HGB 9.8* 8.8*  HCT 29.4* 26.9*  PLT 238 229   BMET  Basename 10/06/11 0420 10/05/11 1110  NA 136 138  K 4.5 4.1  CL 104 104  CO2 24 24  GLUCOSE 109* 108*  BUN 11 12  CREATININE 1.03 1.06  CALCIUM 8.4 8.9    Studies/Results: Dg Chest 1 View  09/27/2011  *RADIOLOGY REPORT*  Clinical Data: Chest pain and labored breathing.  CHEST - 1 VIEW  Comparison: 09/26/2011  Findings: Shallow inspiration.  Linear fibrosis or atelectasis in the lung bases, greater on the right.  No significant change since previous study.  Normal heart size and pulmonary vascularity.  The no focal airspace consolidation in the lungs.  No blunting of costophrenic angles.  No pneumothorax.  Degenerative changes in the spine.  IMPRESSION: Shallow inspiration with linear atelectasis or fibrosis in the lung bases.  Stable appearance since previous study.  Original Report Authenticated By: Marlon Pel, M.D.   Ir Ivc Filter Plmt / S&i /img Guid/mod Sed  10/03/2011  *RADIOLOGY REPORT*  Clinical Data:  Bilateral DVT, GI bleed, the patient cannot be anticoagulated  ULTRASOUND GUIDANCE FOR VASCULAR ACCESS IVC CATHETERIZATION AND VENOGRAM IVC FILTER INSERTION  Date:  10/03/2011 10:45:00  Radiologist:  Judie Petit.  Ruel Favors, M.D.  Medications:  1% lidocaine locally  Guidance:  Ultrasound fluoroscopic  Fluoroscopy time:  0.7 minutes  Sedation time:  None.  Contrast Volume:  50 ml Omnipaque-300  Complications:  No immediate  PROCEDURE/FINDINGS:  Informed consent was obtained from the patient following explanation of the procedure, risks, benefits and alternatives. The patient understands, agrees and consents for the procedure. All questions were addressed.  A time out was performed.  Maximal barrier sterile technique utilized including caps, mask, sterile gowns, sterile gloves, large sterile drape, hand hygiene, and betadine prep.  Under sterile condition and local anesthesia, right internal jugular venous access was performed with ultrasound.  Over a guide wire, the IVC filter delivery sheath and inner dilator were advanced into the IVC just above the IVC bifurcation.  Contrast injection was performed for an IVC venogram.  IVC VENOGRAM:  The IVC is patent.  No evidence of thrombus, stenosis, or occlusion.  No variant venous anatomy.  The renal veins are identified at L2.  IVC FILTER INSERTION:  Through the delivery sheath, the Cordis TrapEase IVC filter was deployed in the infrarenal IVC at the L3 level just below the renal veins and above the IVC bifurcation. Contrast injection confirmed position.  There is good apposition of the filter against the IVC.  The delivery sheath was removed and hemostasis was obtained with compression for 5 minutes.  The patient tolerated the procedure well.  No immediate complications.  IMPRESSION: Ultrasound  and fluoroscopically guided infrarenal IVC filter insertion.  Original Report Authenticated By: Judie Petit. Ruel Favors, M.D.   Dg Chest Portable 1 View  09/26/2011  *RADIOLOGY REPORT*  Clinical Data: Chest burning, productive cough.  PORTABLE CHEST - 1 VIEW  Comparison: 07/28/2011  Findings: Shallow inspiration.  Linear fibrosis or atelectasis in the lung bases, greatest on the right.  This  appears stable since the previous study.  Normal heart size and pulmonary vascularity. No focal airspace consolidation in the lungs.  No blunting of costophrenic angles.  No pneumothorax.  IMPRESSION: Shallow inspiration with linear fibrosis or atelectasis in the lung bases, similar to previous study.  No focal consolidation.  Original Report Authenticated By: Marlon Pel, M.D.   Anti-infectives: Anti-infectives     Start     Dose/Rate Route Frequency Ordered Stop   10/05/11 1730   cefUROXime (ZINACEF) 1.5 g in dextrose 5 % 50 mL IVPB        1.5 g 100 mL/hr over 30 Minutes Intravenous Every 12 hours 10/05/11 1726 10/06/11 0547   10/05/11 0900   cefUROXime (ZINACEF) 1.5 g in dextrose 5 % 50 mL IVPB  Status:  Discontinued        1.5 g 100 mL/hr over 30 Minutes Intravenous 30 min pre-op 10/05/11 0726 10/05/11 1659   10/02/11 1320   ceFAZolin (ANCEF) 1-5 GM-% IVPB     Comments: TRAYWICK, CARL: cabinet override         10/02/11 1320 10/02/11 1320          Assessment/Plan: s/p Procedure(s) (LRB): BYPASS GRAFT FEMORAL-FEMORAL ARTERY (Bilateral) Stable postop day 3 from thrombectomy of right to left fem-fem bypass. Plan per medical service. No restrictions in activity from vascular standpoint   LOS: 12 days   [name] 10/08/2011, 8:40 AM

## 2011-10-08 NOTE — Progress Notes (Signed)
Subjective:  No chest pain  Objective:  Vital Signs in the last 24 hours: Temp:  [97.5 F (36.4 C)-98.6 F (37 C)] 98.6 F (37 C) (06/06 0730) Pulse Rate:  [89-117] 107  (06/06 0730) Resp:  [11-27] 17  (06/06 0730) BP: (115-151)/(54-76) 141/60 mmHg (06/06 0730) SpO2:  [92 %-100 %] 99 % (06/06 0730) Weight:  [69.1 kg (152 lb 5.4 oz)] 69.1 kg (152 lb 5.4 oz) (06/06 0145)  Intake/Output from previous day:  Intake/Output Summary (Last 24 hours) at 10/08/11 0833 Last data filed at 10/08/11 0700  Gross per 24 hour  Intake    120 ml  Output   2675 ml  Net  -2555 ml    Physical Exam: General appearance: alert, cooperative and no distress Lungs: decreased breath sounds Heart: regular rate and rhythm   Rate: 110  Rhythm: sinus tachycardia  Lab Results:  Basename 10/07/11 0851 10/06/11 0420  WBC 6.9 6.9  HGB 9.8* 8.8*  PLT 238 229    Basename 10/06/11 0420 10/05/11 1110  NA 136 138  K 4.5 4.1  CL 104 104  CO2 24 24  GLUCOSE 109* 108*  BUN 11 12  CREATININE 1.03 1.06   No results found for this basename: TROPONINI:2,CK,MB:2 in the last 72 hours Hepatic Function Panel  Basename 10/05/11 1110  PROT 6.4  ALBUMIN 2.7*  AST 11  ALT 8  ALKPHOS 60  BILITOT 0.3  BILIDIR --  IBILI --   No results found for this basename: CHOL in the last 72 hours No results found for this basename: INR in the last 72 hours  Imaging: Imaging results have been reviewed  Cardiac Studies:  Assessment/Plan:   Principal Problem:  *GI bleed, endo 09/28/11 esophagitis, duodenitis. Coumadin discontinued Active Problems:  PVD, thrombosed R-L BPG, S/P thrombectomy 10/05/11  NSTEMI, BMS to CFX/LAD  10/03/11  PE (pulmonary embolism) July 2012, IVC filter placed 10/03/11  Chronic anticoagulation, stopped secondary to GI bleeding  DVT 3/13  Cardiomyopathy, EF 40-50% 2D 09/27/11  Anemia, transfused this admission.  CAD (coronary artery disease)  Sinus tachycardia  Fracture of hip, right, surg  -Feb 2013  SBO (small bowel obstruction), lysis of adhesions Feb 2013  CVA, Rt brain Feb 2013  History of GI bleed Feb 2013  History of renal insufficiency syndrome, 7/12 and 3/13- SCr Nl (1.0) now  BPH (benign prostatic hypertrophy) with urinary retention  Cardiomyopathy, EF 40-50% 2D   Plan- stable cardiac except for persistent sinus tach despite beta blocker Rx, TSH WNL in April 2013.   Corine Shelter PA-C 10/08/2011, 8:33 AM    Agree with note written by Corine Shelter PAC  CV stable. No CP/SOB. Fem-Fem cross over graft patent. Left foot warm with PPP. Labs OK. Getting PT/OT. Will s/o. Please have pt follow up with me as an OP 3-4 weeks.  Runell Gess 10/08/2011 8:38 AM

## 2011-10-09 ENCOUNTER — Telehealth: Payer: Self-pay | Admitting: Vascular Surgery

## 2011-10-09 ENCOUNTER — Other Ambulatory Visit: Payer: Self-pay | Admitting: Thoracic Diseases

## 2011-10-09 DIAGNOSIS — I739 Peripheral vascular disease, unspecified: Secondary | ICD-10-CM

## 2011-10-09 MED ORDER — ACETAMINOPHEN 325 MG PO TABS: 325.0000 mg | ORAL_TABLET | ORAL | Status: AC | PRN

## 2011-10-09 MED ORDER — POLYETHYLENE GLYCOL 3350 17 G PO PACK: 17.0000 g | PACK | Freq: Every day | ORAL | Status: AC

## 2011-10-09 MED ORDER — METOPROLOL TARTRATE 100 MG PO TABS: 100.0000 mg | ORAL_TABLET | Freq: Two times a day (BID) | ORAL | Status: DC

## 2011-10-09 MED ORDER — ISOSORBIDE MONONITRATE ER 30 MG PO TB24: 30.0000 mg | ORAL_TABLET | Freq: Every day | ORAL | Status: DC

## 2011-10-09 MED ORDER — ASPIRIN 81 MG PO CHEW: 81.0000 mg | CHEWABLE_TABLET | Freq: Every day | ORAL | Status: AC

## 2011-10-09 MED ORDER — NITROGLYCERIN 0.4 MG SL SUBL: 0.4000 mg | SUBLINGUAL_TABLET | SUBLINGUAL | Status: DC | PRN

## 2011-10-09 NOTE — Discharge Summary (Addendum)
Physician Discharge Summary  Patient ID: [name] MRN: [id number] DOB/AGE: 08-07-1933 78 y.o.  Admit date: 09/26/2011 Discharge date: 10/09/2011  Admission Diagnoses: Chest pain, blood per rectum Discharge Diagnoses:  Principal Problem:  *GI bleed, endo 09/28/11 esophagitis, duodenitis. Coumadin discontinued Active Problems:  PE (pulmonary embolism) July 2012, IVC filter placed 10/03/11  Chronic anticoagulation, stopped secondary to GI bleeding  Fracture of hip, right, surg -Feb 2013  PVD, thrombosed R-L BPG, S/P thrombectomy 10/05/11  SBO (small bowel obstruction), lysis of adhesions Feb 2013  NSTEMI, BMS to CFX/LAD  10/03/11  DVT 3/13  CVA, Rt brain Feb 2013  History of GI bleed Feb 2013  History of renal insufficiency syndrome, 7/12 and 3/13- SCr Nl (1.0) now  Cardiomyopathy, EF 40-50% 2D 09/27/11  Anemia, transfused this admission.  CAD (coronary artery disease)  BPH (benign prostatic hypertrophy) with urinary retention  Sinus tachycardia  Cardiomyopathy, EF 40-50% 2D   Discharged Condition: stable  Hospital Course:  Patient presented to the hospital with complaint of burning chest pain and melanotic stools. Patient was on full dose anticoagulation for his known history of DVT PE. He initially was managed by pulmonary critical care medicine. He was seen in consultation by GI, underwent EGD which showed duodenitis and esophagitis. His anticoagulation was stopped he was resuscitated with packed red blood cells. Patient had persistent complaint of chest pain, he ruled in for non-ST elevation myocardial infarction. He was seen in consultation by cardiology. He required nitroglycerin drip for several days. Upon stabilization of his GI bleed and stable hemoglobin he underwent heart catheterization. He had 2 bare metal stents placed in the LAD and circumflex. After stabilization of his cardiac issues patient underwent [place] filter placement as it was felt he was high risk for recurrent  bleeding with Coumadin. He's had 2 bleeds within the last 3 months. Patient's hospital course was further complicated by occlusion of his known fem-fem bypass. Patient's left leg became cool and painful. Patient was seen in consultation by vascular surgery and thrombectomy of his fem-fem bypass. He had return of perfusion to his leg. Patient had ABIs 2 days prior to discharge, he was seen by vascular surgery, at this time no limitations to activity. There were no other complications on his hospitalization. He did stay in sinus tachycardia which she has been in the past. We will resume his diltiazem.  Consults: Treatment Team:  Larina Earthly, MD  Significant Diagnostic Studies:Dg Chest 1 View  09/27/2011  *RADIOLOGY REPORT*  Clinical Data: Chest pain and labored breathing.  CHEST - 1 VIEW  Comparison: 09/26/2011  Findings: Shallow inspiration.  Linear fibrosis or atelectasis in the lung bases, greater on the right.  No significant change since previous study.  Normal heart size and pulmonary vascularity.  The no focal airspace consolidation in the lungs.  No blunting of costophrenic angles.  No pneumothorax.  Degenerative changes in the spine.  IMPRESSION: Shallow inspiration with linear atelectasis or fibrosis in the lung bases.  Stable appearance since previous study.  Original Report Authenticated By: Marlon Pel, M.D.   Ir Ivc Filter Plmt / S&i /img Guid/mod Sed  10/03/2011  *RADIOLOGY REPORT*  Clinical Data:  Bilateral DVT, GI bleed, the patient cannot be anticoagulated  ULTRASOUND GUIDANCE FOR VASCULAR ACCESS IVC CATHETERIZATION AND VENOGRAM IVC FILTER INSERTION  Date:  10/03/2011 10:45:00  Radiologist:  Judie Petit. Ruel Favors, M.D.  Medications:  1% lidocaine locally  Guidance:  Ultrasound fluoroscopic  Fluoroscopy time:  0.7 minutes  Sedation time:  None.  Contrast Volume:  50 ml Omnipaque-300  Complications:  No immediate  PROCEDURE/FINDINGS:  Informed consent was obtained from the patient following  explanation of the procedure, risks, benefits and alternatives. The patient understands, agrees and consents for the procedure. All questions were addressed.  A time out was performed.  Maximal barrier sterile technique utilized including caps, mask, sterile gowns, sterile gloves, large sterile drape, hand hygiene, and betadine prep.  Under sterile condition and local anesthesia, right internal jugular venous access was performed with ultrasound.  Over a guide wire, the IVC filter delivery sheath and inner dilator were advanced into the IVC just above the IVC bifurcation.  Contrast injection was performed for an IVC venogram.  IVC VENOGRAM:  The IVC is patent.  No evidence of thrombus, stenosis, or occlusion.  No variant venous anatomy.  The renal veins are identified at L2.  IVC FILTER INSERTION:  Through the delivery sheath, the Cordis TrapEase IVC filter was deployed in the infrarenal IVC at the L3 level just below the renal veins and above the IVC bifurcation. Contrast injection confirmed position.  There is good apposition of the filter against the IVC.  The delivery sheath was removed and hemostasis was obtained with compression for 5 minutes.  The patient tolerated the procedure well.  No immediate complications.  IMPRESSION: Ultrasound and fluoroscopically guided infrarenal IVC filter insertion.  Original Report Authenticated By: Judie Petit. Ruel Favors, M.D.   Dg Chest Portable 1 View  09/26/2011  *RADIOLOGY REPORT*  Clinical Data: Chest burning, productive cough.  PORTABLE CHEST - 1 VIEW  Comparison: 07/28/2011  Findings: Shallow inspiration.  Linear fibrosis or atelectasis in the lung bases, greatest on the right.  This appears stable since the previous study.  Normal heart size and pulmonary vascularity. No focal airspace consolidation in the lungs.  No blunting of costophrenic angles.  No pneumothorax.  IMPRESSION: Shallow inspiration with linear fibrosis or atelectasis in the lung bases, similar to previous  study.  No focal consolidation.  Original Report Authenticated By: Marlon Pel, M.D.      Discharge Exam: Blood pressure 134/71, pulse 96, temperature 98.6 F (37 C), temperature source Oral, resp. rate 12, height 5\' 7"  (1.702 m), weight 66.5 kg (146 lb 9.7 oz), SpO2 94.00%. General appearance: alert and cooperative Resp: clear to auscultation bilaterally Cardio: Sinus tachycardia Extremities: Legs warm well perfused, reduce pulse Neurologic: Motor: Left hemiparesis  Disposition: 03-Skilled Nursing Facility   Medication List  As of 10/09/2011  7:55 AM   STOP taking these medications         metoCLOPramide 10 MG tablet      multivitamin with minerals Tabs      NON FORMULARY      REGLAN 10 MG tablet      warfarin 10 MG tablet         TAKE these medications         acetaminophen 325 MG tablet   Commonly known as: TYLENOL   Take 1-2 tablets (325-650 mg total) by mouth every 4 (four) hours as needed (or temp >/= 101 F).      aspirin 81 MG chewable tablet   Chew 1 tablet (81 mg total) by mouth daily.      clopidogrel 75 MG tablet   Commonly known as: PLAVIX   Take 75 mg by mouth daily.      diltiazem 180 MG 24 hr capsule   Commonly known as: DILACOR XR   Take 180 mg by mouth daily.  FLOMAX 0.4 MG Caps   Generic drug: Tamsulosin HCl   Take 0.4 mg by mouth daily.      isosorbide mononitrate 30 MG 24 hr tablet   Commonly known as: IMDUR   Take 1 tablet (30 mg total) by mouth daily.      lactose free nutrition Liqd   Take 237 mLs by mouth 3 (three) times daily with meals.      metoprolol 100 MG tablet   Commonly known as: LOPRESSOR   Take 1 tablet (100 mg total) by mouth 2 (two) times daily.      nitroGLYCERIN 0.4 MG SL tablet   Commonly known as: NITROSTAT   Place 1 tablet (0.4 mg total) under the tongue every 5 (five) minutes x 3 doses as needed for chest pain.      pantoprazole 40 MG tablet   Commonly known as: PROTONIX   Take 40 mg by mouth  daily.      polyethylene glycol packet   Commonly known as: MIRALAX / GLYCOLAX   Take 17 g by mouth daily.      simvastatin 10 MG tablet   Commonly known as: ZOCOR   Take 10 mg by mouth daily.      vitamin C 500 MG tablet   Commonly known as: ASCORBIC ACID   Take 500 mg by mouth 2 (two) times daily.           35 minutes spent in disposition of this patient  Signed: Chailyn Racette D 10/09/2011, 7:55 AM

## 2011-10-09 NOTE — Care Management Note (Unsigned)
    Page 1 of 1   10/09/2011     2:02:20 PM   CARE MANAGEMENT NOTE 10/09/2011  Patient:  [name], [name]   Account Number:  [id number]  Date Initiated:  09/28/2011  Documentation initiated by:  DAVIS,RHONDA  Subjective/Objective Assessment:   pt with confirmed pe, and chest pain with gi bleed     Action/Plan:   lives at home   Anticipated DC Date:  10/04/2011   Anticipated DC Plan:  HOME/SELF CARE  In-house referral  Clinical Social Worker      DC Planning Services  CM consult      [facility] Choice  NA   Choice offered to / List presented to:  NA   DME arranged  NA      DME agency  NA     HH arranged  NA      HH agency  NA   Status of service:  Completed, signed off Medicare Important Message given?  NA - LOS <3 / Initial given by admissions (If response is "NO", the following Medicare IM given date fields will be blank) Date Medicare IM given:   Date Additional Medicare IM given:    Discharge Disposition:  SKILLED NURSING FACILITY  Per UR Regulation:  Reviewed for med. necessity/level of care/duration of stay  If discussed at Long Length of Stay Meetings, dates discussed:   10/07/2011    Comments:  10/09/11 Sary Bogie,RN,BSN 1400 NOTIFIED BY CSW THAT INSURANCE AUTHORIZATION FOR RETURN TO SNF HAS NOT BEEN OBTAINED,AND PT WILL NOT BE DC'D TODAY. CSW TO NOTIFY MD.  10/09/11 Amadeo Coke,RN,BSN 1000 PT HAS ORDER FOR DC BACK TO [facility].  NOTIFIED CSW TO FACILITATE DC BACK TO SNF TODAY.  5/31 16:00 debbie dowell rn,bsn treanferred from [facility] today after card cath. from [facility] p[facility] for rehab. will follow for snf vs [facility] dep on pregress. will make sw ref also.   [id number] Marcelle Smiling, Rn,BSN,CCM Patient continues to require iv ntg drip for chest pain. Cardiac in to see and treat.   [id number] Marcelle Smiling, RN,BSN,CCM No discharge needs present at time of this review Case Management [phone]

## 2011-10-09 NOTE — Telephone Encounter (Addendum)
Message copied by Rosalyn Charters on Fri Oct 09, 2011 10:29 AM ------      Message from: Melene Plan      Created: Fri Oct 09, 2011  9:11 AM                   ----- Message -----         From: Marlowe Shores, PA         Sent: 10/09/2011   9:01 AM           To: Melene Plan, RN            F/U 3-4 weeks Dr. Arbie Cookey thromb fem-fem bypass needs ABI's       Will order  notified patient of follow up appointment with dr. early on 10-20-11 11:30

## 2011-10-09 NOTE — Progress Notes (Signed)
Pt unable to d/c due to delays with insurance authorization from [place]. Representative called [employer] to request that they expedite review for this pt to return to SNF but this request was declined.  CSW communicated this information to RN, RNCM, and MD. MD requesting pt be transferred to tele floor until d/c Monday.  CSW will continue to follow.  Baxter Flattery, MSW [phone]

## 2011-10-10 NOTE — Progress Notes (Signed)
Subjective: Mr. Galindo is without complaints today.  Was not able to be discharged yesterday and has to wait until Monday for transfer secondary to insurance issues.  Feels well overall.  Continues to have chronic Foley catheter.  No lightheadedness dizziness or shortness of breath or chills or fever  Objective: Weight change: -1.4 kg (-3 lb 1.4 oz)  Intake/Output Summary (Last 24 hours) at 10/10/11 1323 Last data filed at 10/09/11 2107  Gross per 24 hour  Intake    240 ml  Output    450 ml  Net   -210 ml     Filed Vitals:   10/09/11 1138 10/09/11 1622 10/09/11 2105 10/10/11 0640  BP: 122/62 117/68 123/79 131/88  Pulse: 122 103 117 108  Temp: 96.9 F (36.1 C) 97.9 F (36.6 C) 99.1 F (37.3 C) 97 F (36.1 C)  TempSrc: Oral Oral Oral Oral  Resp: 24 18 18 18   Height:      Weight:    65.1 kg (143 lb 8.3 oz)  SpO2: 94% 97% 95% 97%    General appearance: alert and cooperative  Resp: clear to auscultation bilaterally  Cardio: Sinus tachycardia  Extremities: Legs warm well perfused, reduce pulse  Neurologic: Motor: Left hemiparesis    Lab Results: No results found for this basename: NA:2,K:2,CL:2,CO2:2,GLUCOSE:2,BUN:2,CREATININE:2,CALCIUM:2,MG:2,PHOS:2 in the last 72 hours No results found for this basename: AST:2,ALT:2,ALKPHOS:2,BILITOT:2,PROT:2,ALBUMIN:2 in the last 72 hours No results found for this basename: LIPASE:2,AMYLASE:2 in the last 72 hours No results found for this basename: WBC:2,NEUTROABS:2,HGB:2,HCT:2,MCV:2,PLT:2 in the last 72 hours No results found for this basename: CKTOTAL:3,CKMB:3,CKMBINDEX:3,TROPONINI:3 in the last 72 hours No components found with this basename: POCBNP:3 No results found for this basename: DDIMER:2 in the last 72 hours No results found for this basename: HGBA1C:2 in the last 72 hours No results found for this basename: CHOL:2,HDL:2,LDLCALC:2,TRIG:2,CHOLHDL:2,LDLDIRECT:2 in the last 72 hours No results found for this basename:  TSH,T4TOTAL,FREET3,T3FREE,THYROIDAB in the last 72 hours No results found for this basename: VITAMINB12:2,FOLATE:2,FERRITIN:2,TIBC:2,IRON:2,RETICCTPCT:2 in the last 72 hours  Studies/Results: No results found. Medications: Scheduled Meds:   . aspirin  81 mg Oral Daily  . clopidogrel  75 mg Oral Q breakfast  . docusate sodium  100 mg Oral Daily  . isosorbide mononitrate  30 mg Oral Daily  . lactose free nutrition  237 mL Oral BID BM  . metoprolol  2.5 mg Intravenous Once  . metoprolol tartrate  100 mg Oral BID  . pantoprazole  40 mg Oral Q1200  . polyethylene glycol  17 g Oral Daily  . simvastatin  10 mg Oral q1800   Continuous Infusions:  PRN Meds:.acetaminophen, acetaminophen, alum & mag hydroxide-simeth, bisacodyl, guaiFENesin-dextromethorphan, HYDROcodone-homatropine, HYDROmorphone (DILAUDID) injection, morphine injection, nitroGLYCERIN, ondansetron, phenol, senna-docusate, DISCONTD: hydrALAZINE, DISCONTD: labetalol, DISCONTD: metoprolol  Assessment/Plan:  Principal Problem:  GI bleed, endo 09/28/11 esophagitis, duodenitis. Coumadin discontinued - no evidence of further bleeding  Active Problems:  PE (pulmonary embolism) July 2012, IVC filter placed 10/03/11 Urinary retention - chronic Foley catheter  Chronic anticoagulation, stopped secondary to GI bleeding  Fracture of hip, right, surg -Feb 2013  PVD, thrombosed R-L BPG, S/P thrombectomy 10/05/11  SBO (small bowel obstruction), lysis of adhesions Feb 2013  NSTEMI, BMS to CFX/LAD 10/03/11  DVT 3/13  CVA, Rt brain Feb 2013  History of GI bleed Feb 2013  History of renal insufficiency syndrome, 7/12 and 3/13- SCr Nl (1.0) now  Cardiomyopathy, EF 40-50% 2D 09/27/11  Anemia, transfused this admission.  CAD (coronary artery disease)  BPH (benign prostatic  hypertrophy) with urinary retention  Sinus tachycardia  Cardiomyopathy, EF 40-50% 2D  Disposition: Transfer to skilled nursing facility for rehabilitation 10/12/2011    LOS: 14  days   [name] 10/10/2011, 1:23 PM

## 2011-10-11 NOTE — Progress Notes (Signed)
Subjective: No new changes today.  Awaiting discharge tomorrow.  Denies lightheadedness dizziness or shortness of breath or chills or fever.  Objective: Weight change: -1.642 kg (-3 lb 9.9 oz)  Intake/Output Summary (Last 24 hours) at 10/11/11 1229 Last data filed at 10/11/11 0608  Gross per 24 hour  Intake      0 ml  Output   1651 ml  Net  -1651 ml    Filed Vitals:   10/10/11 0640 10/10/11 1515 10/10/11 1958 10/11/11 0336  BP: 131/88 117/72 110/68 135/85  Pulse: 108 95 110 95  Temp: 97 F (36.1 C) 97.8 F (36.6 C) 98.2 F (36.8 C) 98.2 F (36.8 C)  TempSrc: Oral Oral Oral Oral  Resp: 18 18 20 20   Height:      Weight: 65.1 kg (143 lb 8.3 oz)   63.458 kg (139 lb 14.4 oz)  SpO2: 97% 96% 95% 96%    General appearance: alert and cooperative  Resp: clear to auscultation bilaterally  Cardio: Sinus tachycardia  Extremities: Legs warm well perfused, reduce pulse  Neurologic: Motor: Left hemiparesis    Lab Results: No results found for this basename: NA:2,K:2,CL:2,CO2:2,GLUCOSE:2,BUN:2,CREATININE:2,CALCIUM:2,MG:2,PHOS:2 in the last 72 hours No results found for this basename: AST:2,ALT:2,ALKPHOS:2,BILITOT:2,PROT:2,ALBUMIN:2 in the last 72 hours No results found for this basename: LIPASE:2,AMYLASE:2 in the last 72 hours No results found for this basename: WBC:2,NEUTROABS:2,HGB:2,HCT:2,MCV:2,PLT:2 in the last 72 hours No results found for this basename: CKTOTAL:3,CKMB:3,CKMBINDEX:3,TROPONINI:3 in the last 72 hours No components found with this basename: POCBNP:3 No results found for this basename: DDIMER:2 in the last 72 hours No results found for this basename: HGBA1C:2 in the last 72 hours No results found for this basename: CHOL:2,HDL:2,LDLCALC:2,TRIG:2,CHOLHDL:2,LDLDIRECT:2 in the last 72 hours No results found for this basename: TSH,T4TOTAL,FREET3,T3FREE,THYROIDAB in the last 72 hours No results found for this basename:  VITAMINB12:2,FOLATE:2,FERRITIN:2,TIBC:2,IRON:2,RETICCTPCT:2 in the last 72 hours  Studies/Results: No results found. Medications: Scheduled Meds:   . aspirin  81 mg Oral Daily  . clopidogrel  75 mg Oral Q breakfast  . docusate sodium  100 mg Oral Daily  . isosorbide mononitrate  30 mg Oral Daily  . lactose free nutrition  237 mL Oral BID BM  . metoprolol  2.5 mg Intravenous Once  . metoprolol tartrate  100 mg Oral BID  . pantoprazole  40 mg Oral Q1200  . polyethylene glycol  17 g Oral Daily  . simvastatin  10 mg Oral q1800   Continuous Infusions:  PRN Meds:.acetaminophen, acetaminophen, alum & mag hydroxide-simeth, bisacodyl, guaiFENesin-dextromethorphan, HYDROcodone-homatropine, HYDROmorphone (DILAUDID) injection, morphine injection, nitroGLYCERIN, ondansetron, phenol, senna-docusate, DISCONTD: hydrALAZINE, DISCONTD: labetalol, DISCONTD: metoprolol  Assessment/Plan:  Principal Problem:  GI bleed, endo 09/28/11 esophagitis, duodenitis. Coumadin discontinued - no evidence of further bleeding  Active Problems:  PE (pulmonary embolism) July 2012, IVC filter placed 10/03/11  Urinary retention - chronic Foley catheter  Chronic anticoagulation, stopped secondary to GI bleeding  Fracture of hip, right, surg -Feb 2013  PVD, thrombosed R-L BPG, S/P thrombectomy 10/05/11  SBO (small bowel obstruction), lysis of adhesions Feb 2013  NSTEMI, BMS to CFX/LAD 10/03/11  DVT 3/13  CVA, Rt brain Feb 2013  History of GI bleed Feb 2013  History of renal insufficiency syndrome, 7/12 and 3/13- SCr Nl (1.0) now  Cardiomyopathy, EF 40-50% 2D 09/27/11  Anemia, transfused this admission.  CAD (coronary artery disease)  BPH (benign prostatic hypertrophy) with urinary retention  Sinus tachycardia  Cardiomyopathy, EF 40-50% 2D  Disposition: Transfer to skilled nursing facility for rehabilitation 10/12/2011  LOS: 15 days   [name] 10/11/2011, 12:29 PM

## 2011-10-12 NOTE — Progress Notes (Signed)
CSW spoke with Engineer, manufacturing at [employer] authorization. She has not received auth yet and will advise CSW. Hopeful for d/c today and will need updated d/c summary. This CSW handing off to Reece Levy, CSW for unit 2000.  Baxter Flattery, MSW [phone]

## 2011-10-12 NOTE — Progress Notes (Signed)
Patient transferred to this unit and this CSW today- awaiting insurance auth for SNF transfer- faxed additional PT notes to SNF for auth- hopeful to hear back today and be able to transfer later today. Reece Levy, MSW, [facility] [phone]

## 2011-10-12 NOTE — Progress Notes (Signed)
Physical Therapy Treatment Patient Details Name: [name] MRN: [id number] DOB: [birth year] Today's Date: 10/12/2011 Time: 7829-5621 PT Time Calculation (min): 31 min  PT Assessment / Plan / Recommendation Comments on Treatment Session  Patient s/p GIB, MI with Stents, IVC filter, thrombectomy right to left femoral femoral bypass 6/3. Pt fearful of falling after Rt hip fx and this limits his progress, although he is highly motivated. Excellent progress this session and discussed attempting short distance ambulation with 2 person assist on next visit.    Follow Up Recommendations  Skilled nursing facility;Supervision/Assistance - 24 hour    Barriers to Discharge        Equipment Recommendations  Defer to next venue    Recommendations for Other Services    Frequency Min 3X/week   Plan Discharge plan remains appropriate;Frequency remains appropriate    Precautions / Restrictions Precautions Precautions: Fall Restrictions Weight Bearing Restrictions: No   Pertinent Vitals/Pain Pt reported Lt Knee pain 5/10 (pre-medicated by RN just prior)    Mobility  Bed Mobility Bed Mobility: Rolling Right;Right Sidelying to Sit;Sitting - Scoot to [place] of Bed Rolling Right: 4: Min assist;With rail Right Sidelying to Sit: 4: Min assist;With rails;HOB elevated Sitting - Scoot to [place] of Bed: 3: Mod assist Details for Bed Mobility Assistance: Pt eager to do it himself; rolling Rt pt forgets to bring Lt shoulder/UE across and required cues plus min assist; side to sit required cues for sequence, assist to move LLE and to fully come to sitting with torso Transfers Transfers: Sit to Stand;Stand to Sit Sit to Stand: 4: Min assist;With upper extremity assist;From bed;Other (comment) (with rail) Stand to Sit: 4: Min assist;With upper extremity assist;With armrests;To bed;To chair/3-in-1 Details for Transfer Assistance: Pt fearful of LLE buckling--light cues given to knee/quads with no buckling  occuring; stood to RW x 2 and then pivoted to chair on his Rt with assist to maneuver RW; pt able to advance RLE, however slides/drags LLE as pivoting; cues to safely reach back to the surface as sitting to control descent Ambulation/Gait Ambulation/Gait Assistance: Not tested (comment)    Exercises General Exercises - Lower Extremity Ankle Circles/Pumps: AROM;Both;10 reps;Supine Quad Sets: Right;5 reps;Supine Heel Slides: AROM;AAROM;Both;10 reps;Supine;Other (comment) (AAROM to flex LLE; resistance to extension bil) Other Exercises Other Exercises: bridging bil and Lt sided only; x5 each   PT Diagnosis:    PT Problem List:   PT Treatment Interventions:     PT Goals Acute Rehab PT Goals PT Goal: Supine/Side to Sit - Progress: Progressing toward goal PT Goal: Sit at Edge Of Bed - Progress: Met PT Goal: Sit to Stand - Progress: Met PT Transfer Goal: Bed to Chair/Chair to Bed - Progress: Met  Visit Information  Last PT Received On: 10/12/11 Assistance Needed: +1 (bed to chair; +2 to walk)    Subjective Data  Subjective: "I sat up for 5 hours yesterday."  Patient Stated Goal: Pt wants to be able to walk again. Wants to be able to stand to use the urinal   Cognition  Overall Cognitive Status: Appears within functional limits for tasks assessed/performed Arousal/Alertness: Awake/alert Orientation Level: Appears intact for tasks assessed Behavior During Session: [facility] for tasks performed    Balance  Balance Balance Assessed: Yes Static Sitting Balance Static Sitting - Balance Support: Bilateral upper extremity supported;Feet supported Static Sitting - Level of Assistance: 5: Stand by assistance Static Standing Balance Static Standing - Balance Support: Bilateral upper extremity supported Static Standing - Level of Assistance: 4: Min assist;Other (  comment) (with RW)  End of Session PT - End of Session Equipment Utilized During Treatment: Gait belt Activity Tolerance: Patient  tolerated treatment well Patient left: in chair;with call bell/phone within reach;with family/visitor present Nurse Communication: Mobility status;Need for lift equipment (discussed use of RW vs steady)    Shuntel Fishburn 10/12/2011, 12:08 PM Pager [phone]

## 2011-10-12 NOTE — Progress Notes (Signed)
Subjective: No problems a weekend, patient without chest pain shortness of breath, no pain in his leg. Discharge be delayed for insurance reasons  Objective: Vital signs in last 24 hours: Temp:  [98 F (36.7 C)-98.4 F (36.9 C)] 98 F (36.7 C) (06/10 0423) Pulse Rate:  [104-106] 106  (06/10 0423) Resp:  [16-20] 16  (06/10 0423) BP: (115-136)/(70-86) 136/86 mmHg (06/10 0423) SpO2:  [94 %-96 %] 94 % (06/10 0423) Weight:  [64.048 kg (141 lb 3.2 oz)] 64.048 kg (141 lb 3.2 oz) (06/10 0423) Weight change: 0.59 kg (1 lb 4.8 oz) Last BM Date: 10/11/11  Intake/Output from previous day: 06/09 0701 - 06/10 0700 In: 480 [P.O.:480] Out: 401 [Urine:400; Stool:1] Intake/Output this shift: Total I/O In: 240 [P.O.:240] Out: -   General appearance: alert and cooperative Resp: clear to auscultation bilaterally Cardio: regular rate and rhythm, S1, S2 normal, no murmur, click, rub or gallop Extremities: extremities normal, atraumatic, no cyanosis or edema  Lab Results:  No results found for this or any previous visit (from the past 24 hour(s)).    Studies/Results: No results found.  Medications:  Prior to Admission:  Prescriptions prior to admission  Medication Sig Dispense Refill  . clopidogrel (PLAVIX) 75 MG tablet Take 75 mg by mouth daily.      Marland Kitchen diltiazem (DILACOR XR) 180 MG 24 hr capsule Take 180 mg by mouth daily.      Marland Kitchen lactose free nutrition (BOOST PLUS) LIQD Take 237 mLs by mouth 3 (three) times daily with meals.      . pantoprazole (PROTONIX) 40 MG tablet Take 40 mg by mouth daily.      . simvastatin (ZOCOR) 10 MG tablet Take 10 mg by mouth daily.      . Tamsulosin HCl (FLOMAX) 0.4 MG CAPS Take 0.4 mg by mouth daily.      . vitamin C (ASCORBIC ACID) 500 MG tablet Take 500 mg by mouth 2 (two) times daily.      Marland Kitchen DISCONTD: metoCLOPramide (REGLAN) 10 MG tablet Take 10 mg by mouth 3 (three) times daily.      Marland Kitchen DISCONTD: metoprolol (LOPRESSOR) 50 MG tablet Take 1 tablet (50 mg  total) by mouth 2 (two) times daily.      Marland Kitchen DISCONTD: Multiple Vitamin (MULITIVITAMIN WITH MINERALS) TABS Take 1 tablet by mouth daily.      Marland Kitchen DISCONTD: NON FORMULARY 30 mLs 2 (two) times daily. Provide Gold Regular Liquid - for wound healing.      Marland Kitchen DISCONTD: warfarin (COUMADIN) 10 MG tablet Take 10 mg by mouth daily.      Marland Kitchen DISCONTD: metoCLOPramide (REGLAN) 10 MG tablet Take 1 tablet (10 mg total) by mouth 3 (three) times daily before meals.       Scheduled:   . aspirin  81 mg Oral Daily  . clopidogrel  75 mg Oral Q breakfast  . docusate sodium  100 mg Oral Daily  . isosorbide mononitrate  30 mg Oral Daily  . lactose free nutrition  237 mL Oral BID BM  . metoprolol  2.5 mg Intravenous Once  . metoprolol tartrate  100 mg Oral BID  . pantoprazole  40 mg Oral Q1200  . polyethylene glycol  17 g Oral Daily  . simvastatin  10 mg Oral q1800   Continuous:   Assessment/Plan: Principal Problem:  *GI bleed, endo 09/28/11 esophagitis, duodenitis. Coumadin discontinued Active Problems:  PE (pulmonary embolism) July 2012, IVC filter placed 10/03/11  Chronic anticoagulation, stopped secondary to GI  bleeding  Fracture of hip, right, surg -Feb 2013  PVD, thrombosed R-L BPG, S/P thrombectomy 10/05/11  SBO (small bowel obstruction), lysis of adhesions Feb 2013  NSTEMI, BMS to CFX/LAD  10/03/11  DVT 3/13  CVA, Rt brain Feb 2013  History of GI bleed Feb 2013  History of renal insufficiency syndrome, 7/12 and 3/13- SCr Nl (1.0) now  Cardiomyopathy, EF 40-50% 2D 09/27/11  Anemia, transfused this admission.  CAD (coronary artery disease)  BPH (benign prostatic hypertrophy) with urinary retention  Sinus tachycardia  Cardiomyopathy, EF 40-50% 2D  Patient medically stable for discharge to skilled nursing facility  LOS: 16 days   [name] 10/12/2011, 10:35 AM

## 2011-10-12 NOTE — Progress Notes (Signed)
SNF auth rec'd for patient to d/c today to [facility]- patient agreeable- planning transfer via EMS. Dr. Nehemiah Settle advised- Reece Levy, MSW, [facility] [phone]

## 2011-10-19 ENCOUNTER — Encounter: Payer: Self-pay | Admitting: Vascular Surgery

## 2011-10-20 ENCOUNTER — Encounter: Payer: Self-pay | Admitting: Vascular Surgery

## 2011-10-20 ENCOUNTER — Ambulatory Visit (INDEPENDENT_AMBULATORY_CARE_PROVIDER_SITE_OTHER): Payer: Medicare HMO | Admitting: Vascular Surgery

## 2011-10-20 VITALS — BP 111/71 | HR 102 | Resp 16 | Ht 67.0 in | Wt 134.0 lb

## 2011-10-20 DIAGNOSIS — I739 Peripheral vascular disease, unspecified: Secondary | ICD-10-CM

## 2011-10-20 DIAGNOSIS — I7092 Chronic total occlusion of artery of the extremities: Secondary | ICD-10-CM

## 2011-10-20 DIAGNOSIS — Z48812 Encounter for surgical aftercare following surgery on the circulatory system: Secondary | ICD-10-CM

## 2011-10-20 NOTE — Progress Notes (Signed)
The patient should stay for followup of thrombectomy of his right to left fem-fem bypass. This was placed by Dr. Madilyn Fireman approximately 10 years ago. He was admitted with acute coronary syndrome and underwent catheterization via the right groin. He was found to have occlusion of his right to left fem-fem bypass following this and underwent thrombectomy. This was accomplished to his left groin. He is here today for followup. Surgery was on 10/05/2011.  Physical exam well-developed gentleman appearing stated age in no acute distress. He does report some numbness and discomfort in his left foot he is treating this with Tylenol. His feet are quite warm bilaterally he does have well-healed left groin incision and a palpable right to left fem-fem bypass.  Impression and plan. Stable status post thrombectomy of right to left fem-fem bypass. He will continue his walking program. We will see him again in 3 months for continued followup

## 2011-10-20 NOTE — Progress Notes (Signed)
ABI performed @ [facility] 10/20/2011

## 2011-10-21 NOTE — Addendum Note (Signed)
Addended by: Sharee Pimple on: 10/21/2011 08:58 AM   Modules accepted: Orders

## 2012-01-25 ENCOUNTER — Encounter: Payer: Self-pay | Admitting: Neurosurgery

## 2012-01-26 ENCOUNTER — Encounter: Payer: Self-pay | Admitting: Neurosurgery

## 2012-01-26 ENCOUNTER — Ambulatory Visit (INDEPENDENT_AMBULATORY_CARE_PROVIDER_SITE_OTHER): Payer: Medicare HMO | Admitting: Neurosurgery

## 2012-01-26 ENCOUNTER — Encounter (INDEPENDENT_AMBULATORY_CARE_PROVIDER_SITE_OTHER): Payer: Medicare HMO | Admitting: *Deleted

## 2012-01-26 VITALS — BP 122/68 | HR 69 | Resp 16 | Ht 66.0 in | Wt 124.0 lb

## 2012-01-26 DIAGNOSIS — I739 Peripheral vascular disease, unspecified: Secondary | ICD-10-CM

## 2012-01-26 DIAGNOSIS — Z48812 Encounter for surgical aftercare following surgery on the circulatory system: Secondary | ICD-10-CM

## 2012-01-26 DIAGNOSIS — I7092 Chronic total occlusion of artery of the extremities: Secondary | ICD-10-CM | POA: Insufficient documentation

## 2012-01-26 NOTE — Progress Notes (Signed)
[facility] [place] P[facility]/PVD Office Note  CC: Three-month PVD surveillance Referring Physician: Early  History of Present Illness: 78 year old male patient of Dr. Arbie Cookey follows up status post thrombectomy of his right to left fem-fem bypass which was done by Dr. Madilyn Fireman 10 years ago. The patient does report increased lower extremity pain since his last visit here. The patient complains mostly of knee pain as well as some numbness in his left leg. The patient has had joint replacement with Dr. Despina Hick. The patient does not report true claudication or rest pain and has no open ulcerations on his lower extremities.  Past Medical History  Diagnosis Date  . Asthma   . Hypertension   . Pulmonary embolism   . Peripheral artery disease   . Hypercholesteremia   . Bursitis of right shoulder   . DVT of leg (deep venous thrombosis)   . Cough   . Trouble swallowing   . Change in voice   . Difficulty urinating   . Easy bruising   . CAD (coronary artery disease) 10/02/2011    Severe 2 Vessel CAD with early Mid LAD 95% focal lesion followed by 60-70% irregular stenoses just after bifurcating SP1 and AV Groove Cx-OM1/OM2 bifurcation 90% lesion  . Stroke Feb. 2013  . Pneumonia Feb. 2013  . Myocardial infarction Sep 26, 2011    ROS: [x]  Positive   [ ]  Denies    General: [ ]  Weight loss, [ ]  Fever, [ ]  chills Neurologic: [ ]  Dizziness, [ ]  Blackouts, [ ]  Seizure [ ]  Stroke, [ ]  "Mini stroke", [ ]  Slurred speech, [ ]  Temporary blindness; [ ]  weakness in arms or legs, [ ]  Hoarseness Cardiac: [ ]  Chest pain/pressure, [ ]  Shortness of breath at rest [ ]  Shortness of breath with exertion, [ ]  Atrial fibrillation or irregular heartbeat Vascular: [ ]  Pain in legs with walking, [ ]  Pain in legs at rest, [ ]  Pain in legs at night,  [ ]  Non-healing ulcer, [ ]  Blood clot in vein/DVT,   Pulmonary: [ ]  Home oxygen, [ ]  Productive cough, [ ]  Coughing up blood, [ ]  Asthma,  [ ]   Wheezing Musculoskeletal:  [ ]  Arthritis, [ ]  Low back pain, [ ]  Joint pain Hematologic: [ ]  Easy Bruising, [ ]  Anemia; [ ]  Hepatitis Gastrointestinal: [ ]  Blood in stool, [ ]  Gastroesophageal Reflux/heartburn, [ ]  Trouble swallowing Urinary: [ ]  chronic Kidney disease, [ ]  on HD - [ ]  MWF or [ ]  TTHS, [ ]  Burning with urination, [ ]  Difficulty urinating Skin: [ ]  Rashes, [ ]  Wounds Psychological: [ ]  Anxiety, [ ]  Depression   Social History History  Substance Use Topics  . Smoking status: Former Smoker    Quit date: 08/20/1993  . Smokeless tobacco: Never Used  . Alcohol Use: No    Family History History reviewed. No pertinent family history.  No Known Allergies  Current Outpatient Prescriptions  Medication Sig Dispense Refill  . aspirin 81 MG chewable tablet Chew 1 tablet (81 mg total) by mouth daily.      . clopidogrel (PLAVIX) 75 MG tablet Take 75 mg by mouth daily.      Marland Kitchen diltiazem (CARDIZEM CD) 180 MG 24 hr capsule       . diltiazem (DILACOR XR) 180 MG 24 hr capsule Take 180 mg by mouth daily.      . isosorbide mononitrate (IMDUR) 30 MG 24 hr tablet Take 1 tablet (30 mg total) by mouth  daily.      . lactose free nutrition (BOOST PLUS) LIQD Take 237 mLs by mouth 3 (three) times daily with meals.      . metoprolol (LOPRESSOR) 100 MG tablet Take 1 tablet (100 mg total) by mouth 2 (two) times daily.      . Multiple Vitamin (MULTIVITAMIN) tablet Take 1 tablet by mouth daily.      . nitroGLYCERIN (NITROSTAT) 0.4 MG SL tablet Place 1 tablet (0.4 mg total) under the tongue every 5 (five) minutes x 3 doses as needed for chest pain.      . pantoprazole (PROTONIX) 40 MG tablet Take 40 mg by mouth daily.      . simvastatin (ZOCOR) 10 MG tablet Take 10 mg by mouth daily.      . Tamsulosin HCl (FLOMAX) 0.4 MG CAPS Take 0.4 mg by mouth daily.      . vitamin C (ASCORBIC ACID) 500 MG tablet Take 500 mg by mouth 2 (two) times daily.      Marland Kitchen zinc gluconate 50 MG tablet Take 50 mg by mouth  daily.      Marland Kitchen acetaminophen (TYLENOL) 325 MG tablet Take 1-2 tablets (325-650 mg total) by mouth every 4 (four) hours as needed (or temp >/= 101 F).        Physical Examination  Filed Vitals:   01/26/12 1026  BP: 122/68  Pulse: 69  Resp: 16    Body mass index is 20.01 kg/(m^2).  General:  WDWN in NAD Gait: Normal HEENT: WNL Eyes: Pupils equal Pulmonary: normal non-labored breathing , without Rales, rhonchi,  wheezing Cardiac: RRR, without  Murmurs, rubs or gallops; No carotid bruits Abdomen: soft, NT, no masses Skin: no rashes, ulcers noted Vascular Exam/Pulses: Lower extremity pulses are not palpable, skin is pink and well perfused  Extremities without ischemic changes, no Gangrene , no cellulitis; no open wounds; crepitus is present in his knees bilaterally which represents joint degeneration Musculoskeletal: no muscle wasting or atrophy  Neurologic: A&O X 3; Appropriate Affect ; SENSATION: normal; MOTOR FUNCTION:  moving all extremities equally. Speech is fluent/normal  Non-Invasive Vascular Imaging: Slight reduction of ABIs bilaterally, right ABI is 0.45 and monophasic on the left ABI is 0.35 monophasic, this was reviewed by Dr. Arbie Cookey.  ASSESSMENT/PLAN: PVD patient with lower extremity pain probably related to joint degeneration arthritis as well as poor circulation. I discussed labs today with Dr. Arbie Cookey who recommends the patient continue on three-month surveillance. I explained to the patient he should followup with Dr. Despina Hick regarding his knees, he agrees with this, he will followup here in 3 months with repeat ABIs. The patient knows if he begins to develop cold lower extremities he should report to the emergency department. The patient's questions were encouraged and answered, he is in agreement with this plan.  Lauree Chandler ANP  Clinic M.D.: Early

## 2012-01-26 NOTE — Addendum Note (Signed)
Addended by: Sharee Pimple on: 01/26/2012 01:12 PM   Modules accepted: Orders

## 2012-05-05 ENCOUNTER — Ambulatory Visit: Payer: Medicare HMO | Admitting: Neurosurgery

## 2012-05-09 ENCOUNTER — Encounter: Payer: Self-pay | Admitting: Neurosurgery

## 2012-05-10 ENCOUNTER — Ambulatory Visit (INDEPENDENT_AMBULATORY_CARE_PROVIDER_SITE_OTHER): Payer: Medicare HMO | Admitting: Vascular Surgery

## 2012-05-10 ENCOUNTER — Encounter: Payer: Self-pay | Admitting: Neurosurgery

## 2012-05-10 ENCOUNTER — Ambulatory Visit (INDEPENDENT_AMBULATORY_CARE_PROVIDER_SITE_OTHER): Payer: Medicare HMO | Admitting: Neurosurgery

## 2012-05-10 VITALS — BP 122/66 | HR 63 | Resp 16 | Ht 67.0 in | Wt 136.0 lb

## 2012-05-10 DIAGNOSIS — I739 Peripheral vascular disease, unspecified: Secondary | ICD-10-CM

## 2012-05-10 DIAGNOSIS — M25669 Stiffness of unspecified knee, not elsewhere classified: Secondary | ICD-10-CM | POA: Insufficient documentation

## 2012-05-10 DIAGNOSIS — Z48812 Encounter for surgical aftercare following surgery on the circulatory system: Secondary | ICD-10-CM

## 2012-05-10 NOTE — Progress Notes (Signed)
Ankle brachial index performed @ [facility] 05/10/2012 

## 2012-05-10 NOTE — Progress Notes (Signed)
[facility] [place] PAD/PVD Office Note  CC: PAD surveillance Referring Physician: Early  History of Present Illness: 79 year old male patient of Dr. early who status post thrombectomy of the right to left femoral femoral bypass in June 2013 the patient has had diminished ABIs throughout and does not ambulate very much do to left knee orthopedic issues. I had previously dictated the patient had a knee arthroplasty with Dr. Despina Hick however the patient has not had arthroplasty and is seeing Dr. Sherlean Foot and has a pending MRI of the left knee. The patient does report lower extremity pain that he feels is arthritic. The patient denies rest pain other than his left knee.  Past Medical History  Diagnosis Date  . Asthma   . Hypertension   . Pulmonary embolism   . Peripheral artery disease   . Hypercholesteremia   . Bursitis of right shoulder   . DVT of leg (deep venous thrombosis)   . Cough   . Trouble swallowing   . Change in voice   . Difficulty urinating   . Easy bruising   . CAD (coronary artery disease) 10/02/2011    Severe 2 Vessel CAD with early Mid LAD 95% focal lesion followed by 60-70% irregular stenoses just after bifurcating SP1 and AV Groove Cx-OM1/OM2 bifurcation 90% lesion  . Stroke Feb. 2013  . Pneumonia Feb. 2013  . Myocardial infarction Sep 26, 2011    ROS: [x]  Positive   [ ]  Denies    General: [ ]  Weight loss, [ ]  Fever, [ ]  chills Neurologic: [ ]  Dizziness, [ ]  Blackouts, [ ]  Seizure [ ]  Stroke, [ ]  "Mini stroke", [ ]  Slurred speech, [ ]  Temporary blindness; [ ]  weakness in arms or legs, [ ]  Hoarseness Cardiac: [ ]  Chest pain/pressure, [ ]  Shortness of breath at rest [ ]  Shortness of breath with exertion, [ ]  Atrial fibrillation or irregular heartbeat Vascular: [ ]  Pain in legs with walking, [ ]  Pain in legs at rest, [ ]  Pain in legs at night,  [ ]  Non-healing ulcer, [ ]  Blood clot in vein/DVT,   Pulmonary: [ ]  Home oxygen, [ ]  Productive cough, [ ]   Coughing up blood, [ ]  Asthma,  [ ]  Wheezing Musculoskeletal:  [ ]  Arthritis, [ ]  Low back pain, [ ]  Joint pain Hematologic: [ ]  Easy Bruising, [ ]  Anemia; [ ]  Hepatitis Gastrointestinal: [ ]  Blood in stool, [ ]  Gastroesophageal Reflux/heartburn, [ ]  Trouble swallowing Urinary: [ ]  chronic Kidney disease, [ ]  on HD - [ ]  MWF or [ ]  TTHS, [ ]  Burning with urination, [ ]  Difficulty urinating Skin: [ ]  Rashes, [ ]  Wounds Psychological: [ ]  Anxiety, [ ]  Depression   Social History History  Substance Use Topics  . Smoking status: Former Smoker    Quit date: 08/20/1993  . Smokeless tobacco: Never Used  . Alcohol Use: No    Family History History reviewed. No pertinent family history.  No Known Allergies  Current Outpatient Prescriptions  Medication Sig Dispense Refill  . acetaminophen (TYLENOL) 325 MG tablet Take 1-2 tablets (325-650 mg total) by mouth every 4 (four) hours as needed (or temp >/= 101 F).      Marland Kitchen aspirin 81 MG chewable tablet Chew 1 tablet (81 mg total) by mouth daily.      . clopidogrel (PLAVIX) 75 MG tablet Take 75 mg by mouth daily.      Marland Kitchen diltiazem (CARDIZEM CD) 180 MG 24 hr capsule       .  diltiazem (DILACOR XR) 180 MG 24 hr capsule Take 180 mg by mouth daily.      . isosorbide mononitrate (IMDUR) 30 MG 24 hr tablet Take 1 tablet (30 mg total) by mouth daily.      Marland Kitchen lactose free nutrition (BOOST PLUS) LIQD Take 237 mLs by mouth 3 (three) times daily with meals.      . metoprolol (LOPRESSOR) 100 MG tablet Take 1 tablet (100 mg total) by mouth 2 (two) times daily.      . Multiple Vitamin (MULTIVITAMIN) tablet Take 1 tablet by mouth daily.      . nitroGLYCERIN (NITROSTAT) 0.4 MG SL tablet Place 1 tablet (0.4 mg total) under the tongue every 5 (five) minutes x 3 doses as needed for chest pain.      . pantoprazole (PROTONIX) 40 MG tablet Take 40 mg by mouth daily.      . simvastatin (ZOCOR) 10 MG tablet Take 10 mg by mouth daily.      . Tamsulosin HCl (FLOMAX) 0.4 MG  CAPS Take 0.4 mg by mouth daily.      . vitamin C (ASCORBIC ACID) 500 MG tablet Take 500 mg by mouth 2 (two) times daily.      Marland Kitchen zinc gluconate 50 MG tablet Take 50 mg by mouth daily.        Physical Examination  Filed Vitals:   05/10/12 1056  BP: 122/66  Pulse: 63  Resp: 16    Body mass index is 21.30 kg/(m^2).  General:  WDWN in NAD Gait: Normal HEENT: WNL Eyes: Pupils equal Pulmonary: normal non-labored breathing , without Rales, rhonchi,  wheezing Cardiac: RRR, without  Murmurs, rubs or gallops; No carotid bruits Abdomen: soft, NT, no masses Skin: no rashes, ulcers noted Vascular Exam/Pulses: Palpable femoral pulses that are dampened, lower stream the pulses are not palpable, lower extremities appear to be perfused there are no open ulcerations  Extremities without ischemic changes, no Gangrene , no cellulitis; no open wounds;  Musculoskeletal: no muscle wasting or atrophy  Neurologic: A&O X 3; Appropriate Affect ; SENSATION: normal; MOTOR FUNCTION:  moving all extremities equally. Speech is fluent/normal  Non-Invasive Vascular Imaging: ABIs today are 0.5 to monophasic on the right, 0.39 monophasic on the left which is virtually unchanged from previous exam in September 2013  ASSESSMENT/PLAN: This is a patient with lower extremity pain that could be multifactorial. The patient declines further intervention at this time however he will return in 3 months with repeat ABIs and see Dr. Arbie Cookey. The patient has pending orthopedic intervention with Dr. Sherlean Foot, his questions were encouraged and answered, he is in agreement with this plan.  Lauree Chandler ANP  Clinic M.D.: Early

## 2012-05-11 ENCOUNTER — Other Ambulatory Visit: Payer: Self-pay | Admitting: *Deleted

## 2012-05-11 DIAGNOSIS — Z48812 Encounter for surgical aftercare following surgery on the circulatory system: Secondary | ICD-10-CM

## 2012-05-11 DIAGNOSIS — I739 Peripheral vascular disease, unspecified: Secondary | ICD-10-CM

## 2012-08-08 ENCOUNTER — Encounter: Payer: Self-pay | Admitting: Vascular Surgery

## 2012-08-09 ENCOUNTER — Ambulatory Visit (INDEPENDENT_AMBULATORY_CARE_PROVIDER_SITE_OTHER): Payer: Medicare HMO | Admitting: Vascular Surgery

## 2012-08-09 ENCOUNTER — Encounter (INDEPENDENT_AMBULATORY_CARE_PROVIDER_SITE_OTHER): Payer: Medicare HMO | Admitting: *Deleted

## 2012-08-09 ENCOUNTER — Encounter: Payer: Self-pay | Admitting: Vascular Surgery

## 2012-08-09 VITALS — BP 123/67 | HR 70 | Ht 67.0 in | Wt 141.0 lb

## 2012-08-09 DIAGNOSIS — Z48812 Encounter for surgical aftercare following surgery on the circulatory system: Secondary | ICD-10-CM

## 2012-08-09 DIAGNOSIS — I739 Peripheral vascular disease, unspecified: Secondary | ICD-10-CM | POA: Insufficient documentation

## 2012-08-09 NOTE — Addendum Note (Signed)
Addended by: Dannielle Karvonen on: 08/09/2012 02:15 PM   Modules accepted: Orders

## 2012-08-09 NOTE — Progress Notes (Signed)
Patient has today for a followup of his lower surety arterial insufficiency. He had right to left fem-fem bypass prostate 10 years ago and had occlusion of this after cardiac catheterization in June 2013. He does report his legs become tired with activity. He does walk with a walker due to issues with his balance. He is here today with his family. He has no lower trim any tissue loss or rest pain.  Past Medical History  Diagnosis Date  . Asthma   . Hypertension   . Pulmonary embolism   . Peripheral artery disease   . Hypercholesteremia   . Bursitis of right shoulder   . DVT of leg (deep venous thrombosis)   . Cough   . Trouble swallowing   . Change in voice   . Difficulty urinating   . Easy bruising   . CAD (coronary artery disease) 10/02/2011    Severe 2 Vessel CAD with [place] Mid LAD 95% focal lesion followed by 60-70% irregular stenoses just after bifurcating SP1 and AV Groove Cx-OM1/OM2 bifurcation 90% lesion  . Stroke Feb. 2013  . Pneumonia Feb. 2013  . Myocardial infarction Sep 26, 2011    History  Substance Use Topics  . Smoking status: Former Smoker    Quit date: 08/20/1993  . Smokeless tobacco: Never Used  . Alcohol Use: No    Family History  Problem Relation Age of Onset  . Hyperlipidemia Father   . Deep vein thrombosis Father     No Known Allergies  Current outpatient prescriptions:acetaminophen (TYLENOL) 325 MG tablet, Take 1-2 tablets (325-650 mg total) by mouth every 4 (four) hours as needed (or temp >/= 101 F)., Disp: , Rfl: ;  aspirin 81 MG chewable tablet, Chew 1 tablet (81 mg total) by mouth daily., Disp: , Rfl: ;  clopidogrel (PLAVIX) 75 MG tablet, Take 75 mg by mouth daily., Disp: , Rfl: ;  diltiazem (CARDIZEM CD) 180 MG 24 hr capsule, , Disp: , Rfl:  diltiazem (DILACOR XR) 180 MG 24 hr capsule, Take 180 mg by mouth daily., Disp: , Rfl: ;  isosorbide mononitrate (IMDUR) 30 MG 24 hr tablet, Take 1 tablet (30 mg total) by mouth daily., Disp: , Rfl: ;  lactose  free nutrition (BOOST PLUS) LIQD, Take 237 mLs by mouth 3 (three) times daily with meals., Disp: , Rfl: ;  metoprolol (LOPRESSOR) 100 MG tablet, Take 1 tablet (100 mg total) by mouth 2 (two) times daily., Disp: , Rfl:  Multiple Vitamin (MULTIVITAMIN) tablet, Take 1 tablet by mouth daily., Disp: , Rfl: ;  nitroGLYCERIN (NITROSTAT) 0.4 MG SL tablet, Place 1 tablet (0.4 mg total) under the tongue every 5 (five) minutes x 3 doses as needed for chest pain., Disp: , Rfl: ;  pantoprazole (PROTONIX) 40 MG tablet, Take 40 mg by mouth daily., Disp: , Rfl: ;  simvastatin (ZOCOR) 10 MG tablet, Take 10 mg by mouth daily., Disp: , Rfl:  Tamsulosin HCl (FLOMAX) 0.4 MG CAPS, Take 0.4 mg by mouth daily., Disp: , Rfl: ;  vitamin C (ASCORBIC ACID) 500 MG tablet, Take 500 mg by mouth 2 (two) times daily., Disp: , Rfl: ;  zinc gluconate 50 MG tablet, Take 50 mg by mouth daily., Disp: , Rfl: ;  citalopram (CELEXA) 20 MG tablet, Take 1 tablet by mouth daily., Disp: , Rfl:   BP 123/67  Pulse 70  Ht 5\' 7"  (1.702 m)  Wt 141 lb (63.957 kg)  BMI 22.08 kg/m2  SpO2 95%  Body mass index is  22.08 kg/(m^2).       Physical exam well-developed alert gentleman in no acute distress Palpable radial pulses bilaterally Palpable femoral pulses bilaterally with a palpable fem-fem graft pulse. Absent popliteal and distal pulses bilaterally Mild bilateral extremity swelling with no tissue loss.  Vascular lab study reveals ankle arm index stable at 0.46 on the right and 0.38 on the left. This is no significant change since his last study in January.  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 No evidence of limb threatening ischemia. I patient will continue his walking program and continue to see [place] on a 6 month intervals.

## 2012-09-16 ENCOUNTER — Other Ambulatory Visit (HOSPITAL_COMMUNITY): Payer: Self-pay | Admitting: Internal Medicine

## 2012-09-16 DIAGNOSIS — R1310 Dysphagia, unspecified: Secondary | ICD-10-CM

## 2012-09-22 ENCOUNTER — Ambulatory Visit (HOSPITAL_COMMUNITY)
Admission: RE | Admit: 2012-09-22 | Discharge: 2012-09-22 | Disposition: A | Discharge status: Home / Self Care | Payer: Medicare HMO | Source: Ambulatory Visit | Attending: Internal Medicine | Admitting: Internal Medicine

## 2012-09-22 DIAGNOSIS — R1310 Dysphagia, unspecified: Secondary | ICD-10-CM

## 2012-09-22 DIAGNOSIS — R1312 Dysphagia, oropharyngeal phase: Secondary | ICD-10-CM | POA: Insufficient documentation

## 2012-09-22 DIAGNOSIS — R1313 Dysphagia, pharyngeal phase: Secondary | ICD-10-CM | POA: Insufficient documentation

## 2012-09-22 DIAGNOSIS — R1311 Dysphagia, oral phase: Secondary | ICD-10-CM | POA: Insufficient documentation

## 2012-09-22 NOTE — Procedures (Signed)
Objective Swallowing Evaluation: Modified Barium Swallowing Study  Patient Details  Name: [name] MRN: [id number] Date of Birth: [birth year]  Today's Date: 09/22/2012 Time: 1000-1030 SLP Time Calculation (min): 30 min  Past Medical History:  Past Medical History  Diagnosis Date  . Asthma   . Hypertension   . Pulmonary embolism   . Peripheral artery disease   . Hypercholesteremia   . Bursitis of right shoulder   . DVT of leg (deep venous thrombosis)   . Cough   . Trouble swallowing   . Change in voice   . Difficulty urinating   . Easy bruising   . CAD (coronary artery disease) 10/02/2011    Severe 2 Vessel CAD with early Mid LAD 95% focal lesion followed by 60-70% irregular stenoses just after bifurcating SP1 and AV Groove Cx-OM1/OM2 bifurcation 90% lesion  . Stroke Feb. 2013  . Pneumonia Feb. 2013  . Myocardial infarction Sep 26, 2011   Past Surgical History:  Past Surgical History  Procedure Laterality Date  . Hernia repair    . Appendectomy    . Femoral-femoral bypass graft    . Iliac artery stent    . Esophagogastroduodenoscopy  07/03/2011    Procedure: ESOPHAGOGASTRODUODENOSCOPY (EGD);  Surgeon: Shirley Friar, MD;  Location: [facility];  Service: Endoscopy;  Laterality: N/A;  . Compression hip screw  06/26/2011    Procedure: COMPRESSION HIP;  Surgeon: Raymon Mutton, MD;  Location: [facility];  Service: Orthopedics;  Laterality: Right;  . Laparoscopy  07/10/2011    Procedure: LAPAROSCOPY DIAGNOSTIC;  Surgeon: Ardeth Sportsman, MD;  Location: [facility];  Service: General;  Laterality: N/A;  serousal repair,  . Esophagogastroduodenoscopy  09/28/2011    Procedure: ESOPHAGOGASTRODUODENOSCOPY (EGD);  Surgeon: Willis Modena, MD;  Location: [facility];  Service: Endoscopy;  Laterality: N/A;  . Femoral-femoral bypass graft  10/05/2011    Procedure: BYPASS GRAFT FEMORAL-FEMORAL ARTERY;  Surgeon: Larina Earthly, MD;  Location: [facility] OR;  Service: Vascular;  Laterality: Bilateral;   Thrombectomy of Femoral-Femoral artery bypass graft  . Fracture surgery  06/24/11    Right Hip  . Joint replacement  06/24/11    Right Hip  . Small intestine surgery  March 2013   HPI:  79 year old male with extensive PMH including asthma, , HTN, EGD with recurrent duodenitis and esophagitis, hip fracture, CVA with left sided hemiparesis, MI, PNA seen for MBS due to c/o coughing with pos. Noted to have decreased breath sounds and some rhonchi during last MD appointment and thus MBS was ordered to r/o aspiration.      Assessment / Plan / Recommendation Clinical Impression  Dysphagia Diagnosis: Mild oral phase dysphagia;Mild pharyngeal phase dysphagia Clinical impression: Patient presents with a mild oropharyngeal dysphagia characterized by delayed swallow initiation resulting in trace penetration of thin liquids which clears with cued throat clears and spontaneous dry swallows. Otherwise, strength good and patient without episodes of aspiration. Recommend continuation of a regular diet, thin liquids but with use of aspiration precautions including slow rate of intake, small bites/sips, and intermittent throat clearing. Education complete with patient and son regarding above. Son does report that patient with often rapid rate of intake, large sips which may account for increased difficulty noted at home with po intake.     Treatment Recommendation  No treatment recommended at this time    Diet Recommendation Regular;Thin liquid (may consider mechanical soft until dentures fit)   Liquid Administration via: Cup;Straw Medication Administration: Whole meds with liquid Supervision:  Patient able to self feed;Intermittent supervision to cue for compensatory strategies Compensations: Slow rate;Small sips/bites Postural Changes and/or Swallow Maneuvers: Seated upright 90 degrees    Other  Recommendations Oral Care Recommendations: Oral care BID   Follow Up Recommendations  None                General HPI: 79 year old male with extensive PMH including asthma, , HTN, EGD with recurrent duodenitis and esophagitis, hip fracture, CVA with left sided hemiparesis, MI, PNA seen for MBS due to c/o coughing with pos. Noted to have decreased breath sounds and some rhonchi during last MD appointment and thus MBS was ordered to r/o aspiration.  Type of Study: Modified Barium Swallowing Study Reason for Referral: Objectively evaluate swallowing function Previous Swallow Assessment: none reported Diet Prior to this Study: Regular;Thin liquids Temperature Spikes Noted: No Respiratory Status: Room air History of Recent Intubation: No Behavior/Cognition: Alert;Cooperative;Pleasant mood Oral Cavity - Dentition: Poor condition (top dentures ill fitting, patient does not wear) Oral Motor / Sensory Function: Within functional limits Self-Feeding Abilities: Able to feed self Patient Positioning: Upright in chair Baseline Vocal Quality: Clear Volitional Cough: Strong Volitional Swallow: Able to elicit Anatomy: Within functional limits Pharyngeal Secretions: Not observed secondary MBS    Reason for Referral Objectively evaluate swallowing function   Oral Phase Oral Preparation/Oral Phase Oral Phase: WFL   Pharyngeal Phase Pharyngeal Phase Pharyngeal Phase: Impaired Pharyngeal - Thin Pharyngeal - Thin Cup: Delayed swallow initiation;Premature spillage to valleculae;Penetration/Aspiration before swallow Penetration/Aspiration details (thin cup): Material enters airway, remains ABOVE vocal cords and not ejected out Pharyngeal - Thin Straw: Delayed swallow initiation;Premature spillage to valleculae;Penetration/Aspiration before swallow Penetration/Aspiration details (thin straw): Material enters airway, remains ABOVE vocal cords and not ejected out Pharyngeal - Solids Pharyngeal - Puree: Delayed swallow initiation;Premature spillage to valleculae Pharyngeal - Mechanical Soft: Delayed swallow  initiation;Premature spillage to valleculae Pharyngeal - Pill: Within functional limits  Cervical Esophageal Phase    GO    Cervical Esophageal Phase Cervical Esophageal Phase: [facility]    Functional Assessment Tool Used: skilled clinical judgement Functional Limitations: Swallowing Swallow Current Status (A2130): At least 1 percent but less than 20 percent impaired, limited or restricted Swallow Goal Status [phone]): At least 1 percent but less than 20 percent impaired, limited or restricted Swallow Discharge Status [id number]): At least 1 percent but less than 20 percent impaired, limited or restricted   [facility] MA, CCC-SLP [phone]  Ferdinand Lango Meryl 09/22/2012, 2:33 PM

## 2012-09-27 ENCOUNTER — Ambulatory Visit: Payer: Medicare HMO

## 2012-11-28 ENCOUNTER — Other Ambulatory Visit (HOSPITAL_COMMUNITY): Payer: Self-pay | Admitting: Cardiovascular Disease

## 2012-11-28 DIAGNOSIS — I739 Peripheral vascular disease, unspecified: Secondary | ICD-10-CM

## 2012-12-12 ENCOUNTER — Ambulatory Visit (HOSPITAL_COMMUNITY)
Admission: RE | Admit: 2012-12-12 | Discharge: 2012-12-12 | Disposition: A | Discharge status: Home / Self Care | Payer: Medicare HMO | Source: Ambulatory Visit | Attending: Cardiovascular Disease | Admitting: Cardiovascular Disease

## 2012-12-12 DIAGNOSIS — I70219 Atherosclerosis of native arteries of extremities with intermittent claudication, unspecified extremity: Secondary | ICD-10-CM

## 2012-12-12 DIAGNOSIS — I739 Peripheral vascular disease, unspecified: Secondary | ICD-10-CM | POA: Insufficient documentation

## 2012-12-12 NOTE — Progress Notes (Signed)
Lower Ext. Arterial Duplex Completed. [name], BS, RDMS, RVT  

## 2012-12-21 ENCOUNTER — Encounter: Payer: Self-pay | Admitting: *Deleted

## 2013-01-03 ENCOUNTER — Emergency Department (HOSPITAL_COMMUNITY)
Admission: EM | Admit: 2013-01-03 | Discharge: 2013-01-03 | Disposition: A | Discharge status: Home / Self Care | Payer: Medicare HMO | Attending: Emergency Medicine | Admitting: Emergency Medicine

## 2013-01-03 ENCOUNTER — Encounter (HOSPITAL_COMMUNITY): Payer: Self-pay

## 2013-01-03 ENCOUNTER — Emergency Department (HOSPITAL_COMMUNITY): Payer: Medicare HMO

## 2013-01-03 DIAGNOSIS — Y9301 Activity, walking, marching and hiking: Secondary | ICD-10-CM | POA: Insufficient documentation

## 2013-01-03 DIAGNOSIS — J45909 Unspecified asthma, uncomplicated: Secondary | ICD-10-CM | POA: Insufficient documentation

## 2013-01-03 DIAGNOSIS — Z8701 Personal history of pneumonia (recurrent): Secondary | ICD-10-CM | POA: Insufficient documentation

## 2013-01-03 DIAGNOSIS — Z86718 Personal history of other venous thrombosis and embolism: Secondary | ICD-10-CM | POA: Insufficient documentation

## 2013-01-03 DIAGNOSIS — Z8673 Personal history of transient ischemic attack (TIA), and cerebral infarction without residual deficits: Secondary | ICD-10-CM | POA: Insufficient documentation

## 2013-01-03 DIAGNOSIS — W19XXXA Unspecified fall, initial encounter: Secondary | ICD-10-CM

## 2013-01-03 DIAGNOSIS — S42309A Unspecified fracture of shaft of humerus, unspecified arm, initial encounter for closed fracture: Secondary | ICD-10-CM | POA: Insufficient documentation

## 2013-01-03 DIAGNOSIS — Z79899 Other long term (current) drug therapy: Secondary | ICD-10-CM | POA: Insufficient documentation

## 2013-01-03 DIAGNOSIS — Y929 Unspecified place or not applicable: Secondary | ICD-10-CM | POA: Insufficient documentation

## 2013-01-03 DIAGNOSIS — I2510 Atherosclerotic heart disease of native coronary artery without angina pectoris: Secondary | ICD-10-CM | POA: Insufficient documentation

## 2013-01-03 DIAGNOSIS — I10 Essential (primary) hypertension: Secondary | ICD-10-CM | POA: Insufficient documentation

## 2013-01-03 DIAGNOSIS — Z86711 Personal history of pulmonary embolism: Secondary | ICD-10-CM | POA: Insufficient documentation

## 2013-01-03 DIAGNOSIS — Z87891 Personal history of nicotine dependence: Secondary | ICD-10-CM | POA: Insufficient documentation

## 2013-01-03 DIAGNOSIS — E7800 Pure hypercholesterolemia, unspecified: Secondary | ICD-10-CM | POA: Insufficient documentation

## 2013-01-03 DIAGNOSIS — Y92009 Unspecified place in unspecified non-institutional (private) residence as the place of occurrence of the external cause: Secondary | ICD-10-CM

## 2013-01-03 DIAGNOSIS — S42302A Unspecified fracture of shaft of humerus, left arm, initial encounter for closed fracture: Secondary | ICD-10-CM

## 2013-01-03 DIAGNOSIS — W010XXA Fall on same level from slipping, tripping and stumbling without subsequent striking against object, initial encounter: Secondary | ICD-10-CM | POA: Insufficient documentation

## 2013-01-03 DIAGNOSIS — I252 Old myocardial infarction: Secondary | ICD-10-CM | POA: Insufficient documentation

## 2013-01-03 HISTORY — DX: Unspecified fall, initial encounter: Y92.009

## 2013-01-03 HISTORY — DX: Unspecified place in unspecified non-institutional (private) residence as the place of occurrence of the external cause: W19.XXXA

## 2013-01-03 MED ORDER — ACETAMINOPHEN 325 MG PO TABS
650.0000 mg | ORAL_TABLET | Freq: Once | ORAL | Status: AC
  Administered 2013-01-03: 650 mg via ORAL
  Filled 2013-01-03: qty 2

## 2013-01-03 MED ORDER — HYDROCODONE-ACETAMINOPHEN 5-325 MG PO TABS: 1.0000 | ORAL_TABLET | ORAL | Status: DC | PRN

## 2013-01-03 NOTE — ED Provider Notes (Signed)
Medical screening examination/treatment/procedure(s) were conducted as a shared visit with non-physician practitioner(s) and myself.  I personally evaluated the patient during the encounter Pt presents w/ LUE pain after fall, found to have spinal humerus fracture.  Pt has numbness/swelling over site, but is NVI distally.  Ortho consulted, pt placed in cast & will f/u as outpt.   Shanna Cisco, MD 01/03/13 2224

## 2013-01-03 NOTE — ED Notes (Signed)
Patient transported to X-ray 

## 2013-01-03 NOTE — ED Provider Notes (Signed)
CSN: [id number]     Arrival date & time 01/03/13  4098 History   First MD Initiated Contact with Patient 01/03/13 [phone]     Chief Complaint  Patient presents with  . Fall  . Arm Pain   (Consider location/radiation/quality/duration/timing/severity/associated sxs/prior Treatment) HPI Comments: 79 year old male presents to the emergency department with his daughter and son complaining of left upper arm pain after a mechanical fall around 7:30 this morning. Patient states he was walking down the steps, his hand was wet and slippery when he tried to grab the rail causing him to slip and fall. Denies hitting his head or loss of consciousness. States he fell onto the rail with his left arm. Per his daughter, wife saw the fall. Pain is "pretty bad" with associated numbness in his upper arm. Denies shoulder or elbow pain. States his left arm feels weak, however son states his prior stroke affected his left side.  Patient is a 79 y.o. male presenting with fall and arm pain. The history is provided by the patient and a relative.  Fall Associated symptoms include numbness.  Arm Pain Associated symptoms include numbness.    Past Medical History  Diagnosis Date  . Asthma   . Hypertension   . Pulmonary embolism   . Peripheral artery disease   . Hypercholesteremia   . Bursitis of right shoulder   . DVT of leg (deep venous thrombosis)   . Cough   . Trouble swallowing   . Change in voice   . Difficulty urinating   . Easy bruising   . CAD (coronary artery disease) 10/02/2011    Severe 2 Vessel CAD with early Mid LAD 95% focal lesion followed by 60-70% irregular stenoses just after bifurcating SP1 and AV Groove Cx-OM1/OM2 bifurcation 90% lesion  . Stroke Feb. 2013  . Pneumonia Feb. 2013  . Myocardial infarction Sep 26, 2011   Past Surgical History  Procedure Laterality Date  . Hernia repair    . Appendectomy    . Femoral-femoral bypass graft    . Iliac artery stent    .  Esophagogastroduodenoscopy  07/03/2011    Procedure: ESOPHAGOGASTRODUODENOSCOPY (EGD);  Surgeon: Shirley Friar, MD;  Location: [facility];  Service: Endoscopy;  Laterality: N/A;  . Compression hip screw  06/26/2011    Procedure: COMPRESSION HIP;  Surgeon: Raymon Mutton, MD;  [facility]: [facility];  Service: Orthopedics;  Laterality: Right;  . Laparoscopy  07/10/2011    Procedure: LAPAROSCOPY DIAGNOSTIC;  Surgeon: Ardeth Sportsman, MD;  Location: [facility];  Service: General;  Laterality: N/A;  serousal repair,  . Esophagogastroduodenoscopy  09/28/2011    Procedure: ESOPHAGOGASTRODUODENOSCOPY (EGD);  Surgeon: Willis Modena, MD;  Location: [facility];  Service: Endoscopy;  Laterality: N/A;  . Femoral-femoral bypass graft  10/05/2011    Procedure: BYPASS GRAFT FEMORAL-FEMORAL ARTERY;  Surgeon: Larina Earthly, MD;  Location: [facility] OR;  Service: Vascular;  Laterality: Bilateral;  Thrombectomy of Femoral-Femoral artery bypass graft  . Fracture surgery  06/24/11    Right Hip  . Joint replacement  06/24/11    Right Hip  . Small intestine surgery  March 2013   Family History  Problem Relation Age of Onset  . Hyperlipidemia Father   . Deep vein thrombosis Father    History  Substance Use Topics  . Smoking status: Former Smoker    Quit date: 08/20/1993  . Smokeless tobacco: Never Used  . Alcohol Use: No    Review of Systems  Musculoskeletal:  Positive for right arm pain.  Neurological: Positive for numbness.  All other systems reviewed and are negative.    Allergies  Review of patient's allergies indicates no known allergies.  Home Medications   Current Outpatient Rx  Name  Route  Sig  Dispense  Refill  . citalopram (CELEXA) 20 MG tablet   Oral   Take 1 tablet by mouth daily.         . clopidogrel (PLAVIX) 75 MG tablet   Oral   Take 75 mg by mouth daily.         Marland Kitchen diltiazem (CARDIZEM CD) 180 MG 24 hr capsule               . diltiazem (DILACOR XR) 180 MG 24 hr capsule    Oral   Take 180 mg by mouth daily.         Marland Kitchen lactose free nutrition (BOOST PLUS) LIQD   Oral   Take 237 mLs by mouth 3 (three) times daily with meals.         . Multiple Vitamin (MULTIVITAMIN) tablet   Oral   Take 1 tablet by mouth daily.         Marland Kitchen EXPIRED: nitroGLYCERIN (NITROSTAT) 0.4 MG SL tablet   Sublingual   Place 1 tablet (0.4 mg total) under the tongue every 5 (five) minutes x 3 doses as needed for chest pain.         . pantoprazole (PROTONIX) 40 MG tablet   Oral   Take 40 mg by mouth daily.         . simvastatin (ZOCOR) 10 MG tablet   Oral   Take 10 mg by mouth daily.         . Tamsulosin HCl (FLOMAX) 0.4 MG CAPS   Oral   Take 0.4 mg by mouth daily.         . vitamin C (ASCORBIC ACID) 500 MG tablet   Oral   Take 500 mg by mouth 2 (two) times daily.         Marland Kitchen zinc gluconate 50 MG tablet   Oral   Take 50 mg by mouth daily.          BP 131/69  Pulse 82  Temp(Src) 97.9 F (36.6 C) (Oral)  Resp 16  Ht 5\' 7"  (1.702 m)  Wt 138 lb (62.596 kg)  BMI 21.61 kg/m2  SpO2 93% Physical Exam  Nursing note and vitals reviewed. Constitutional: He is oriented to person, place, and time. He appears well-developed and well-nourished. No distress.  HENT:  Head: Normocephalic and atraumatic.  Mouth/Throat: Oropharynx is clear and moist.  Eyes: Conjunctivae are normal.  Neck: Normal range of motion. Neck supple.  Cardiovascular: Normal rate, regular rhythm, normal heart sounds and intact distal pulses.   Pulmonary/Chest: Effort normal and breath sounds normal.  Musculoskeletal: Normal range of motion. He exhibits no edema.  Tender to palpation of left upper arm, mild swelling noted, no deformity or bruising. Skin intact. Full ROM of left wrist and elbow. Pain with shoulder flexion. No left shoulder tenderness or deformity.  Neurological: He is alert and oriented to person, place, and time. No sensory deficit.  Skin: Skin is warm, dry and intact. He is not  diaphoretic.  Psychiatric: He has a normal mood and affect. His behavior is normal.    ED Course  Procedures (including critical care time) Labs Review Labs Reviewed - No data to display Imaging Review Dg Shoulder Left  01/03/2013   *  RADIOLOGY REPORT*  Clinical Data: Fall.  Left arm pain.  LEFT SHOULDER - 2+ VIEW  Comparison: None.  Findings: There is partial visualization of a spiral fracture of the diaphysis of the left humerus.  The humerus is located and the acromioclavicular joint is intact.  Imaged left lung and ribs are unremarkable.  IMPRESSION: Partial visualization of a spiral fracture of the diaphysis of the left humerus.  No other acute abnormality.   Original Report Authenticated By: Holley Dexter, M.D.   Dg Humerus Left  01/03/2013   *RADIOLOGY REPORT*  Clinical Data: Fall, left arm pain.  LEFT HUMERUS - 2+ VIEW  Comparison: None.  Findings: The patient has a spiral fracture of the humeral diaphysis.  Proximal aspect of the fracture line is visualized 12 cm below the top of the humeral head.  The fracture extends to approximately 10 cm above the elbow.  The fracture is distracted up to approximately 1.2 cm.  IMPRESSION: Spiral fracture left humerus as described.   Original Report Authenticated By: Holley Dexter, M.D.    MDM   1. Humerus fracture, left, closed, initial encounter    Patient with spiral fracture of left humerus. Neurovascularly intact. I spoke with Nida Boatman, physician Assistant with [facility] orthopedics who saw xray and advised a coaptation splint, sling, pain control and followup in the office next week. I discussed this with patient and family who state their understanding of plan and are agreeable. Return precautions discussed.    Trevor Mace, PA-C 01/03/13 1138

## 2013-01-03 NOTE — ED Notes (Signed)
Called ortho tech 

## 2013-01-03 NOTE — ED Notes (Signed)
Pt c/o lt arm pain, states slipped and fell down 3 steps, denies loc

## 2013-01-06 ENCOUNTER — Emergency Department (HOSPITAL_COMMUNITY): Payer: Medicare HMO

## 2013-01-06 ENCOUNTER — Encounter (HOSPITAL_COMMUNITY): Payer: Self-pay | Admitting: Emergency Medicine

## 2013-01-06 ENCOUNTER — Emergency Department (HOSPITAL_COMMUNITY)
Admission: EM | Admit: 2013-01-06 | Discharge: 2013-01-06 | Disposition: A | Discharge status: Home / Self Care | Payer: Medicare HMO | Attending: Emergency Medicine | Admitting: Emergency Medicine

## 2013-01-06 DIAGNOSIS — Z87891 Personal history of nicotine dependence: Secondary | ICD-10-CM | POA: Insufficient documentation

## 2013-01-06 DIAGNOSIS — Z8781 Personal history of (healed) traumatic fracture: Secondary | ICD-10-CM | POA: Insufficient documentation

## 2013-01-06 DIAGNOSIS — Z79899 Other long term (current) drug therapy: Secondary | ICD-10-CM | POA: Insufficient documentation

## 2013-01-06 DIAGNOSIS — Y9389 Activity, other specified: Secondary | ICD-10-CM | POA: Insufficient documentation

## 2013-01-06 DIAGNOSIS — Z86718 Personal history of other venous thrombosis and embolism: Secondary | ICD-10-CM | POA: Insufficient documentation

## 2013-01-06 DIAGNOSIS — I10 Essential (primary) hypertension: Secondary | ICD-10-CM | POA: Insufficient documentation

## 2013-01-06 DIAGNOSIS — R296 Repeated falls: Secondary | ICD-10-CM | POA: Insufficient documentation

## 2013-01-06 DIAGNOSIS — Z7902 Long term (current) use of antithrombotics/antiplatelets: Secondary | ICD-10-CM | POA: Insufficient documentation

## 2013-01-06 DIAGNOSIS — E7800 Pure hypercholesterolemia, unspecified: Secondary | ICD-10-CM | POA: Insufficient documentation

## 2013-01-06 DIAGNOSIS — Y92009 Unspecified place in unspecified non-institutional (private) residence as the place of occurrence of the external cause: Secondary | ICD-10-CM | POA: Insufficient documentation

## 2013-01-06 DIAGNOSIS — I2510 Atherosclerotic heart disease of native coronary artery without angina pectoris: Secondary | ICD-10-CM | POA: Insufficient documentation

## 2013-01-06 DIAGNOSIS — I252 Old myocardial infarction: Secondary | ICD-10-CM | POA: Insufficient documentation

## 2013-01-06 DIAGNOSIS — Z8673 Personal history of transient ischemic attack (TIA), and cerebral infarction without residual deficits: Secondary | ICD-10-CM | POA: Insufficient documentation

## 2013-01-06 DIAGNOSIS — S8392XA Sprain of unspecified site of left knee, initial encounter: Secondary | ICD-10-CM

## 2013-01-06 DIAGNOSIS — J45909 Unspecified asthma, uncomplicated: Secondary | ICD-10-CM | POA: Insufficient documentation

## 2013-01-06 DIAGNOSIS — Z86711 Personal history of pulmonary embolism: Secondary | ICD-10-CM | POA: Insufficient documentation

## 2013-01-06 DIAGNOSIS — Z8701 Personal history of pneumonia (recurrent): Secondary | ICD-10-CM | POA: Insufficient documentation

## 2013-01-06 DIAGNOSIS — IMO0002 Reserved for concepts with insufficient information to code with codable children: Secondary | ICD-10-CM | POA: Insufficient documentation

## 2013-01-06 MED ORDER — HYDROCODONE-ACETAMINOPHEN 5-325 MG PO TABS
1.0000 | ORAL_TABLET | Freq: Once | ORAL | Status: AC
  Administered 2013-01-06: 1 via ORAL
  Filled 2013-01-06: qty 1

## 2013-01-06 NOTE — ED Notes (Signed)
Case management to speak to pt prior to d/c

## 2013-01-06 NOTE — Progress Notes (Signed)
ED CM spoke with Jasmine December and pt after pcp appointment PCP able to evaluate pt and will be assisting with completing FL2 form left at his office.  Pt still wanting to be seen by a home health agency prior to Monday Pt informed that [facility] can see him on Saturday He [facility] and if changes will contact Thea Alken CM permission to confirm again [facility] visits and to contact [facility] to hold services that where scheduled on Monday 01/09/13 Cm left Baxter Hire of [facility] a voice message to confirm start of home services on Saturday 01/07/13 and CM spoke with Dondra Spry at [facility] to hold home services originally scheduled on 01/09/13 Dondra Spry welcomed a return call if services may be still needed  ED CM signing off

## 2013-01-06 NOTE — Progress Notes (Signed)
Faxed clinicals to Dr Nehemiah Settle with fax confirmation received at [phone] to [phone] Faxed clinicals & Referral to [facility] with fax confirmation received at [phone] to [phone] after speaking with Dorris Carnes clinicals & Referral to [facility] with fax confirmation received at [phone] to [phone] Faxed clinicals & Referral to [facility] with fax confirmation received at [phone] to [phone] after speaking with Maralyn Sago who reports not having a SW and her manager would not be able to review referral until Monday 01/09/13

## 2013-01-06 NOTE — Progress Notes (Signed)
[facility] ED CM received a call from [facility] [place] [facility] stating [facility] called inquiring about services [facility] unable to provider services Eber Jones from [facility] called to inform CM that pt can be seen by [facility] staff out of [place] but can not start until Monday  Cm called sharon at [phone] to update her and pt.  Jasmine December reports she called Dr Nehemiah Settle office who was able to allow pt to be seen at 2;45 pm 01/06/13 Pt changed his mind and informed [facility] he would like services from [facility] if they could assist him before Monday 01/09/13 Cm called Baxter Hire of [facility] who stated pt could be seen on Saturday if service still will be needed after pt is seen by Dr Nehemiah Settle today Home health services will be initiated via [facility] at this time Pending results of Appointment with Dr Nehemiah Settle, the agencies will be called back by CM

## 2013-01-06 NOTE — ED Provider Notes (Signed)
CSN: [id number]     Arrival date & time 01/06/13  0900 History   First MD Initiated Contact with Patient 01/06/13 0908     No chief complaint on file.  (Consider location/radiation/quality/duration/timing/severity/associated sxs/prior Treatment) The history is provided by the patient.   patient here complaining of left knee pain that began 2 days ago after he fell. Seen and evaluated here diagnosed with humerus fracture. Has had increasing pain to his left knee characterized as sharp and worse with standing. Denies any hip pain. No foot or ankle pain. Has used his hydrocodone with limited relief. Denies any eye swelling or pain. No calf pain or swelling.  Past Medical History  Diagnosis Date  . Asthma   . Hypertension   . Pulmonary embolism   . Peripheral artery disease   . Hypercholesteremia   . Bursitis of right shoulder   . DVT of leg (deep venous thrombosis)   . Cough   . Trouble swallowing   . Change in voice   . Difficulty urinating   . Easy bruising   . CAD (coronary artery disease) 10/02/2011    Severe 2 Vessel CAD with early Mid LAD 95% focal lesion followed by 60-70% irregular stenoses just after bifurcating SP1 and AV Groove Cx-OM1/OM2 bifurcation 90% lesion  . Stroke Feb. 2013  . Pneumonia Feb. 2013  . Myocardial infarction Sep 26, 2011   Past Surgical History  Procedure Laterality Date  . Hernia repair    . Appendectomy    . Femoral-femoral bypass graft    . Iliac artery stent    . Esophagogastroduodenoscopy  07/03/2011    Procedure: ESOPHAGOGASTRODUODENOSCOPY (EGD);  Surgeon: Shirley Friar, MD;  Location: [facility];  Service: Endoscopy;  Laterality: N/A;  . Compression hip screw  06/26/2011    Procedure: COMPRESSION HIP;  Surgeon: Raymon Mutton, MD;  Location: [facility];  Service: Orthopedics;  Laterality: Right;  . Laparoscopy  07/10/2011    Procedure: LAPAROSCOPY DIAGNOSTIC;  Surgeon: Ardeth Sportsman, MD;  Location: [facility];  Service: General;  Laterality: N/A;   serousal repair,  . Esophagogastroduodenoscopy  09/28/2011    Procedure: ESOPHAGOGASTRODUODENOSCOPY (EGD);  Surgeon: Willis Modena, MD;  Location: [facility];  Service: Endoscopy;  Laterality: N/A;  . Femoral-femoral bypass graft  10/05/2011    Procedure: BYPASS GRAFT FEMORAL-FEMORAL ARTERY;  Surgeon: Larina Earthly, MD;  Location: [facility] OR;  Service: Vascular;  Laterality: Bilateral;  Thrombectomy of Femoral-Femoral artery bypass graft  . Fracture surgery  06/24/11    Right Hip  . Joint replacement  06/24/11    Right Hip  . Small intestine surgery  March 2013   Family History  Problem Relation Age of Onset  . Hyperlipidemia Father   . Deep vein thrombosis Father    History  Substance Use Topics  . Smoking status: Former Smoker    Quit date: 08/20/1993  . Smokeless tobacco: Never Used  . Alcohol Use: No    Review of Systems  All other systems reviewed and are negative.    Allergies  Review of patient's allergies indicates no known allergies.  Home Medications   Current Outpatient Rx  Name  Route  Sig  Dispense  Refill  . citalopram (CELEXA) 20 MG tablet   Oral   Take 1 tablet by mouth daily.         . clopidogrel (PLAVIX) 75 MG tablet   Oral   Take 75 mg by mouth daily.         Marland Kitchen  diltiazem (DILACOR XR) 180 MG 24 hr capsule   Oral   Take 180 mg by mouth daily.         Marland Kitchen HYDROcodone-acetaminophen (NORCO/VICODIN) 5-325 MG per tablet   Oral   Take 1-2 tablets by mouth every 4 (four) hours as needed for pain.   20 tablet   0   . isosorbide mononitrate (IMDUR) 30 MG 24 hr tablet   Oral   Take 30 mg by mouth daily.         . metoprolol (LOPRESSOR) 100 MG tablet   Oral   Take 100 mg by mouth 2 (two) times daily.         . Multiple Vitamin (MULTIVITAMIN) tablet   Oral   Take 1 tablet by mouth daily.         . pantoprazole (PROTONIX) 40 MG tablet   Oral   Take 40 mg by mouth daily.         . simvastatin (ZOCOR) 10 MG tablet   Oral   Take 10 mg by  mouth daily.         . Tamsulosin HCl (FLOMAX) 0.4 MG CAPS   Oral   Take 0.4 mg by mouth daily.         . vitamin C (ASCORBIC ACID) 500 MG tablet   Oral   Take 500 mg by mouth 2 (two) times daily.         Marland Kitchen zinc gluconate 50 MG tablet   Oral   Take 50 mg by mouth daily.          There were no vitals taken for this visit. Physical Exam  Nursing note and vitals reviewed. Constitutional: He is oriented to person, place, and time. He appears well-developed and well-nourished.  Non-toxic appearance. No distress.  HENT:  Head: Normocephalic and atraumatic.  Eyes: Conjunctivae, EOM and lids are normal. Pupils are equal, round, and reactive to light.  Neck: Normal range of motion. Neck supple. No tracheal deviation present. No mass present.  Cardiovascular: Normal rate, regular rhythm and normal heart sounds.  Exam reveals no gallop.   No murmur heard. Pulmonary/Chest: Effort normal and breath sounds normal. No stridor. No respiratory distress. He has no decreased breath sounds. He has no wheezes. He has no rhonchi. He has no rales.  Abdominal: Soft. Normal appearance and bowel sounds are normal. He exhibits no distension. There is no tenderness. There is no rebound and no CVA tenderness.  Musculoskeletal: Normal range of motion. He exhibits no edema.       Left knee: He exhibits no swelling, no effusion, no deformity, no erythema and no LCL laxity. Tenderness found. Medial joint line tenderness noted.       Legs: Neurological: He is alert and oriented to person, place, and time. He has normal strength. No cranial nerve deficit or sensory deficit. GCS eye subscore is 4. GCS verbal subscore is 5. GCS motor subscore is 6.  Skin: Skin is warm and dry. No abrasion and no rash noted.  Psychiatric: He has a normal mood and affect. His speech is normal and behavior is normal.    ED Course  Procedures (including critical care time) Labs Review Labs Reviewed - No data to display Imaging  Review No results found.  MDM  No diagnosis found. X-ray of the knee is negative. Suspect possible ligamentous injury. We discharged home with orthopedic follow up    Toy Baker, MD 01/06/13 [phone]

## 2013-01-06 NOTE — ED Notes (Signed)
Pt states his L knee is hurting more than usual. Pt states medial side of knee is sore. Able to move knee slightly. Family states pt uses a walker at home but has had difficulty moving it for past 2 days. Pt has had stroke in past and has residual L side weakness.

## 2013-01-06 NOTE — Progress Notes (Addendum)
1140 CM called and left a voice message for Dr Renford Dills [address] [place] [address], [place], [place] [address] [phone] related pt [facility] ED visit on 01/06/13 mentioned pt wanting to go to snf and daughter coming to provided a FL2 Received fax number [phone] from receptionist to fax 01/06/13 clinicals  Pt and Jasmine December made CM of a scheduled appointment with Dr Nehemiah Settle for 01/12/13 but preference is for pt to be in snf prior to appointment

## 2013-01-06 NOTE — Progress Notes (Signed)
CM reviewed in details medicare guidelines, home health [facility]) (length of stay in home, types of [facility] staff available, coverage, primary caregiver, up to 24 hrs before services may be started), Private duty nursing (PDN-coverage, length of stay in the home types of staff available) and Skilled nursing facilities (snf- coverage and services offered) with the pt, sharon (daughter) and son (sammie) CM reviewed availability of HH SW to assist pcp to get pt to snf (if desired disposition) from the community level. CM provided family with a list of [place] home health agencies, PDN, and snfs.   Discussed pt to be further evaluated by Home health RN, therapists (PT/OT) for recommendation of level of care and HHSW and they will share this with pcp  CM spoke with Darwin at [facility]  [phone] at 1049 to confirm in network home health agencies for pt policy include [facility], [facility], [facility], [facility] and [facility]   Pt does not want [facility] for [facility] services Choices per pt and sharon are [facility], [facility] or [facility] They provided 3 agencies to have a first come first serve basis  Choice offered to / List presented to:  HOME HEALTH AGENCIES SERVING [place] [employer] that are Medicare-Certified and are affiliated with The [facility] Home Health Agency  Telephone Number Address  [facility]   The [facility] has ownership interest in this company; however, you are under no obligation to use this agency. [phone] or  [phone] [address] [place], [place] [address] [email or web address]   Agencies that are Medicare-Certified and are not affiliated with The [facility] Agency Telephone Number Address  [facility] [phone] Fax [phone] [address] [place], [place]   [address] [email or web address]  [facility] [phone] or [phone] Fax [phone] [address] [place], [place] [address] [email or web address]  [facility] [phone] Fax [phone] [address] [place], [place] [address] [email or web address]  [facility] [phone] Fax [phone] [address] [place], [place]  [address] [email or web address]  [facility] [phone] Fax [phone] [address] Suite [place], [place] [address] [email or web address]  [facility] [phone] [address] [place], [place] [address] [email or web address]  [facility] [phone]  [address] [place], [place] [address] [email or web address]  [facility] [phone] or [phone] Fax number [phone] [address] [place], [place]  [address] [email or web address]  [facility] [phone] Fax [phone] [address] [place], [place]  [address]  [facility]  [phone] Fax [phone] [address] [place], [place] [address] [email or web address]

## 2013-02-16 ENCOUNTER — Other Ambulatory Visit: Payer: Self-pay | Admitting: Vascular Surgery

## 2013-02-16 DIAGNOSIS — I739 Peripheral vascular disease, unspecified: Secondary | ICD-10-CM

## 2013-02-21 ENCOUNTER — Ambulatory Visit: Payer: Medicare HMO | Admitting: Vascular Surgery

## 2013-02-27 ENCOUNTER — Encounter: Payer: Self-pay | Admitting: Family

## 2013-02-28 ENCOUNTER — Encounter: Payer: Self-pay | Admitting: Family

## 2013-02-28 ENCOUNTER — Ambulatory Visit (INDEPENDENT_AMBULATORY_CARE_PROVIDER_SITE_OTHER): Payer: Medicare HMO | Admitting: Family

## 2013-02-28 ENCOUNTER — Ambulatory Visit (HOSPITAL_COMMUNITY)
Admission: RE | Admit: 2013-02-28 | Discharge: 2013-02-28 | Disposition: A | Discharge status: Home / Self Care | Payer: Medicare HMO | Source: Ambulatory Visit | Attending: Family | Admitting: Family

## 2013-02-28 VITALS — BP 120/73 | HR 119 | Resp 18 | Ht 67.0 in | Wt 137.0 lb

## 2013-02-28 DIAGNOSIS — I739 Peripheral vascular disease, unspecified: Secondary | ICD-10-CM | POA: Insufficient documentation

## 2013-02-28 DIAGNOSIS — Z48812 Encounter for surgical aftercare following surgery on the circulatory system: Secondary | ICD-10-CM | POA: Insufficient documentation

## 2013-02-28 DIAGNOSIS — M79609 Pain in unspecified limb: Secondary | ICD-10-CM

## 2013-02-28 DIAGNOSIS — I6529 Occlusion and stenosis of unspecified carotid artery: Secondary | ICD-10-CM

## 2013-02-28 NOTE — Patient Instructions (Addendum)
Peripheral Vascular Disease Peripheral Vascular Disease (PVD), also called Peripheral Arterial Disease (PAD), is a circulation problem caused by cholesterol (atherosclerotic plaque) deposits in the arteries. PVD commonly occurs in the lower extremities (legs) but it can occur in other areas of the body, such as your arms. The cholesterol buildup in the arteries reduces blood flow which can cause pain and other serious problems. The presence of PVD can place a person at risk for Coronary Artery Disease (CAD).  CAUSES  Causes of PVD can be many. It is usually associated with more than one risk factor such as:   High Cholesterol.  Smoking.  Diabetes.  Lack of exercise or inactivity.  High blood pressure (hypertension).  Obesity.  Family history. SYMPTOMS   When the lower extremities are affected, patients with PVD may experience:  Leg pain with exertion or physical activity. This is called INTERMITTENT CLAUDICATION. This may present as cramping or numbness with physical activity. The location of the pain is associated with the level of blockage. For example, blockage at the abdominal level (distal abdominal aorta) may result in buttock or hip pain. Lower leg arterial blockage may result in calf pain.  As PVD becomes more severe, pain can develop with less physical activity.  In people with severe PVD, leg pain may occur at rest.  Other PVD signs and symptoms:  Leg numbness or weakness.  Coldness in the affected leg or foot, especially when compared to the other leg.  A change in leg color.  Patients with significant PVD are more prone to ulcers or sores on toes, feet or legs. These may take longer to heal or may reoccur. The ulcers or sores can become infected.  If signs and symptoms of PVD are ignored, gangrene may occur. This can result in the loss of toes or loss of an entire limb.  Not all leg pain is related to PVD. Other medical conditions can cause leg pain such  as:  Blood clots (embolism) or Deep Vein Thrombosis.  Inflammation of the blood vessels (vasculitis).  Spinal stenosis. DIAGNOSIS  Diagnosis of PVD can involve several different types of tests. These can include:  Pulse Volume Recording Method (PVR). This test is simple, painless and does not involve the use of X-rays. PVR involves measuring and comparing the blood pressure in the arms and legs. An ABI (Ankle-Brachial Index) is calculated. The normal ratio of blood pressures is 1. As this number becomes smaller, it indicates more severe disease.  < 0.95  indicates significant narrowing in one or more leg vessels.  <0.8 there will usually be pain in the foot, leg or buttock with exercise.  <0.4 will usually have pain in the legs at rest.  <0.25  usually indicates limb threatening PVD.  Doppler detection of pulses in the legs. This test is painless and checks to see if you have a pulses in your legs/feet.  A dye or contrast material (a substance that highlights the blood vessels so they show up on x-ray) may be given to help your caregiver better see the arteries for the following tests. The dye is eliminated from your body by the kidney's. Your caregiver may order blood work to check your kidney function and other laboratory values before the following tests are performed:  Magnetic Resonance Angiography (MRA). An MRA is a picture study of the blood vessels and arteries. The MRA machine uses a large magnet to produce images of the blood vessels.  Computed Tomography Angiography (CTA). A CTA is a   specialized x-ray that looks at how the blood flows in your blood vessels. An IV may be inserted into your arm so contrast dye can be injected.  Angiogram. Is a procedure that uses x-rays to look at your blood vessels. This procedure is minimally invasive, meaning a small incision (cut) is made in your groin. A small tube (catheter) is then inserted into the artery of your groin. The catheter is  guided to the blood vessel or artery your caregiver wants to examine. Contrast dye is injected into the catheter. X-rays are then taken of the blood vessel or artery. After the images are obtained, the catheter is taken out. TREATMENT  Treatment of PVD involves many interventions which may include:  Lifestyle changes:  Quitting smoking.  Exercise.  Following a low fat, low cholesterol diet.  Control of diabetes.  Foot care is very important to the PVD patient. Good foot care can help prevent infection.  Medication:  Cholesterol-lowering medicine.  Blood pressure medicine.  Anti-platelet drugs.  Certain medicines may reduce symptoms of Intermittent Claudication.  Interventional/Surgical options:  Angioplasty. An Angioplasty is a procedure that inflates a balloon in the blocked artery. This opens the blocked artery to improve blood flow.  Stent Implant. A wire mesh tube (stent) is placed in the artery. The stent expands and stays in place, allowing the artery to remain open.  Peripheral Bypass Surgery. This is a surgical procedure that reroutes the blood around a blocked artery to help improve blood flow. This type of procedure may be performed if Angioplasty or stent implants are not an option. SEEK IMMEDIATE MEDICAL CARE IF:   You develop pain or numbness in your arms or legs.  Your arm or leg turns cold, becomes blue in color.  You develop redness, warmth, swelling and pain in your arms or legs. MAKE SURE YOU:   Understand these instructions.  Will watch your condition.  Will get help right away if you are not doing well or get worse. Document Released: 05/28/2004 Document Revised: 07/13/2011 Document Reviewed: 04/24/2008 Exit[employer] Patient Information 2014 [employer].   Stroke Prevention Some medical conditions and behaviors are associated with an increased chance of having a stroke. You may prevent a stroke by making healthy choices and managing medical  conditions. Reduce your risk of having a stroke by:  Staying physically active. Get at least 30 minutes of activity on most or all days.  Not smoking. It may also be helpful to avoid exposure to secondhand smoke.  Limiting alcohol use. Moderate alcohol use is considered to be:  No more than 2 drinks per day for men.  No more than 1 drink per day for nonpregnant women.  Eating healthy foods.  Include 5 or more servings of fruits and vegetables a day.  Certain diets may be prescribed to address high blood pressure, high cholesterol, diabetes, or obesity.  Managing your cholesterol levels.  A low-saturated fat, low-trans fat, low-cholesterol, and high-fiber diet may control cholesterol levels.  Take any prescribed medicines to control cholesterol as directed by your caregiver.  Managing your diabetes.  A controlled-carbohydrate, controlled-sugar diet is recommended to manage diabetes.  Take any prescribed medicines to control diabetes as directed by your caregiver.  Controlling your high blood pressure (hypertension).  A low-salt (sodium), low-saturated fat, low-trans fat, and low-cholesterol diet is recommended to manage high blood pressure.  Take any prescribed medicines to control hypertension as directed by your caregiver.  Maintaining a healthy weight.  A reduced-calorie, low-sodium, low-saturated fat, low-trans   fat, low-cholesterol diet is recommended to manage weight.  Stopping drug abuse.  Avoiding birth control pills.  Talk to your caregiver about the risks of taking birth control pills if you are over 35 years old, smoke, get migraines, or have ever had a blood clot.  Getting evaluated for sleep disorders (sleep apnea).  Talk to your caregiver about getting a sleep evaluation if you snore a lot or have excessive sleepiness.  Taking medicines as directed by your caregiver.  For some people, aspirin or blood thinners (anticoagulants) are helpful in reducing  the risk of forming abnormal blood clots that can lead to stroke. If you have the irregular heart rhythm of atrial fibrillation, you should be on a blood thinner unless there is a good reason you cannot take them.  Understand all your medicine instructions. SEEK IMMEDIATE MEDICAL CARE IF:   You have sudden weakness or numbness of the face, arm, or leg, especially on one side of the body.  You have sudden confusion.  You have trouble speaking (aphasia) or understanding.  You have sudden trouble seeing in one or both eyes.  You have sudden trouble walking.  You have dizziness.  You have a loss of balance or coordination.  You have a sudden, severe headache with no known cause.  You have new chest pain or an irregular heartbeat. Any of these symptoms may represent a serious problem that is an emergency. Do not wait to see if the symptoms will go away. Get medical help right away. Call your local emergency services (911 in [place] Do not drive yourself to the hospital. Document Released: 05/28/2004 Document Revised: 07/13/2011 Document Reviewed: 12/08/2010 [employer] Patient Information 2014 [employer].  

## 2013-02-28 NOTE — Progress Notes (Signed)
[facility] [place] HISTORY AND PHYSICAL -PAD  History of Present Illness [name] is a 79 y.o. male patient that Dr. Arbie Cookey has been following for infra-inguinal occlusive disease with patent right to left fem-fem bypass on 10/22/11, no evidence of limb threatening ischemia at his visit 6 months prior. He had right to left fem-fem bypass 10 years ago and had occlusion of this after cardiac catheterization in June 2013.  Six months ago vascular lab study revealed ankle arm index stable at 0.46 on the right and 0.38 on the left. This was no significant change since his previous study in January, 2014.  He had a stroke in Feb., 2013 after left hip surgery, then an MI in May, 2013.  Had SVC umbrella placed at some point. Was walking with walker until he fell and fractured left humerus recently, is using w/c mostly, rarely walks. Walks about 2 minutes before left ankle hurts. He reports that he has a good appetite and likes to drink Boost.  Patient denies non healing ulcers on legs/feet.  Patient denies New Medical or Surgical History.  Pt Diabetic: No Pt smoker: former smoker, quit 20 years ago  Pt meds include: Statin :Yes Betablocker: Yes ASA: Yes Other anticoagulants/antiplatelets: Plavix  Past Medical History  Diagnosis Date  . Asthma   . Hypertension   . Pulmonary embolism   . Peripheral artery disease   . Hypercholesteremia   . Bursitis of right shoulder   . DVT of leg (deep venous thrombosis)   . Cough   . Trouble swallowing   . Change in voice   . Difficulty urinating   . Easy bruising   . CAD (coronary artery disease) 10/02/2011    Severe 2 Vessel CAD with early Mid LAD 95% focal lesion followed by 60-70% irregular stenoses just after bifurcating SP1 and AV Groove Cx-OM1/OM2 bifurcation 90% lesion  . Stroke Feb. 2013  . Pneumonia Feb. 2013  . Myocardial infarction Sep 26, 2011    Social History History  Substance Use Topics  . Smoking  status: Former Smoker    Quit date: 08/20/1993  . Smokeless tobacco: Never Used  . Alcohol Use: No    Family History Family History  Problem Relation Age of Onset  . Hyperlipidemia Father   . Deep vein thrombosis Father     Past Surgical History  Procedure Laterality Date  . Hernia repair    . Appendectomy    . Femoral-femoral bypass graft    . Iliac artery stent    . Esophagogastroduodenoscopy  07/03/2011    Procedure: ESOPHAGOGASTRODUODENOSCOPY (EGD);  Surgeon: Shirley Friar, MD;  Location: [facility];  Service: Endoscopy;  Laterality: N/A;  . Compression hip screw  06/26/2011    Procedure: COMPRESSION HIP;  Surgeon: Raymon Mutton, MD;  Location: [facility];  Service: Orthopedics;  Laterality: Right;  . Laparoscopy  07/10/2011    Procedure: LAPAROSCOPY DIAGNOSTIC;  Surgeon: Ardeth Sportsman, MD;  Location: [facility];  Service: General;  Laterality: N/A;  serousal repair,  . Esophagogastroduodenoscopy  09/28/2011    Procedure: ESOPHAGOGASTRODUODENOSCOPY (EGD);  Surgeon: Willis Modena, MD;  Location: [facility];  Service: Endoscopy;  Laterality: N/A;  . Femoral-femoral bypass graft  10/05/2011    Procedure: BYPASS GRAFT FEMORAL-FEMORAL ARTERY;  Surgeon: Larina Earthly, MD;  Location: [facility] OR;  Service: Vascular;  Laterality: Bilateral;  Thrombectomy of Femoral-Femoral artery bypass graft  . Fracture surgery  06/24/11    Right Hip  . Joint replacement  06/24/11    Right Hip  . Small intestine surgery  March 2013    No Known Allergies  Current Outpatient Prescriptions  Medication Sig Dispense Refill  . aspirin EC 81 MG tablet Take 81 mg by mouth daily.      . citalopram (CELEXA) 20 MG tablet Take 20 mg by mouth daily.       . clopidogrel (PLAVIX) 75 MG tablet Take 75 mg by mouth daily.      Marland Kitchen diltiazem (DILACOR XR) 180 MG 24 hr capsule Take 180 mg by mouth daily.      Marland Kitchen HYDROcodone-acetaminophen (NORCO/VICODIN) 5-325 MG per tablet Take 1-2 tablets by mouth every 4 (four) hours as  needed for pain.  20 tablet  0  . isosorbide mononitrate (IMDUR) 30 MG 24 hr tablet Take 30 mg by mouth daily.      . metoprolol (LOPRESSOR) 100 MG tablet Take 100 mg by mouth 2 (two) times daily.      . Multiple Vitamin (MULTIVITAMIN WITH MINERALS) TABS tablet Take 1 tablet by mouth daily.      . pantoprazole (PROTONIX) 40 MG tablet Take 40 mg by mouth daily.      . simvastatin (ZOCOR) 10 MG tablet Take 10 mg by mouth every evening.       . Tamsulosin HCl (FLOMAX) 0.4 MG CAPS Take 0.4 mg by mouth daily.      . vitamin C (ASCORBIC ACID) 500 MG tablet Take 500 mg by mouth 2 (two) times daily.      Marland Kitchen zinc gluconate 50 MG tablet Take 50 mg by mouth daily.       No current facility-administered medications for this visit.    ROS: [x]  Positive   [ ]  Denies  General:[ ]  Weight loss,  [ ]  Weight gain, [ ]  Fever, [ ]  chills Neurologic: [ ]  Dizziness, [ ]  Blackouts, [ ]  Seizure [ ]  Stroke, [ ]  "Mini stroke", [ ]  Slurred speech, [ ]  Temporary blindness;  [ ] weakness, [ ]  Hoarseness Cardiac: [ ]  Chest pain/pressure, [ ]  Shortness of breath at rest [ ]  Shortness of breath with exertion,  [ ]   Atrial fibrillation or irregular heartbeat Vascular:[ ]  Pain in legs with walking, [ ]  Pain in legs at rest ,[ ]  Pain in legs at night,  [ ]   Non-healing ulcer, [ ]  Blood clot in vein/DVT,   Pulmonary: [ ]  Home oxygen, [ ]   Productive cough, [ ]  Coughing up blood,  [ ]  Asthma,  [ ]  Wheezing Musculoskeletal:  [ ]  Arthritis, [ ]  Low back pain,  [ ]  Joint pain Hematologic:[ ]  Easy Bruising, [ ]  Anemia; [ ]  Hepatitis Gastrointestinal: [ ]  Blood in stool,  [ ]  Gastroesophageal Reflux, [ ]  Trouble swallowing Urinary: [ ]  chronic Kidney disease, [ ]  on HD, [ ]  Burning with urination, [ ]  Frequent urination, [ ]  Difficulty urinating;  Skin: [ ]  Rashes, [ ]  Wounds     Physical Examination  Filed Vitals:   02/28/13 1006  BP: 120/73  Pulse: 119  Resp: 18   Filed Weights   02/28/13 1006  Weight: 137 lb (62.143  kg)   Body mass index is 21.45 kg/(m^2).  General: A&O x 3, WDWN, thin. Gait: in w/c Eyes: PERRLA, Pulmonary: diminished air movement all fields, without wheezes , rales in left base Cardiac: regular Rythm , without detected murmur       Carotid Bruits Left Right   Negative Negative  Aorta: is not palpable  Radial pulses: 2+ and equal.                           VASCULAR EXAM: Extremities without ischemic changes  without Gangrene; without open wounds.                                                                                                          LE Pulses LEFT RIGHT       POPLITEAL  not palpable   not palpable       POSTERIOR TIBIAL  not palpable   not palpable        DORSALIS PEDIS      ANTERIOR TIBIAL not palpable  not palpable    Abdomen: soft, NT, no masses. Skin: no rashes, no ulcers noted. Musculoskeletal: no muscle wasting or atrophy.  Neurologic: A&O X 3; Appropriate Affect ; SENSATION: normal; MOTOR FUNCTION:  moving all extremities equally, motor strength 3/5 right upper and lower extremities, 2/5 left upper and lower extremities. Speech is fluent/normal. CN 2-12 except for left facial asymmetry with smile.    Non-Invasive Vascular Imaging: DATE: 02/28/2013 ABI: RIGHT 0.45, Waveforms: monophasic;  LEFT 0.4, Waveforms: monophasic   (08/09/12): Right: 0.46, Left: 0.38  ASSESSMENT: Asani Mcburney is a 79 y.o. male who is status post right to left fem-fem bypass on 10/22/11, and presents with severe arterial occlusive disease in the right LE and critical arterial occlusive disease in the left LE, stable in the last 6 months. He is mostly wheelchair bound due to fall issues and recent fractured left humerus from a fall. He does not have limb threatening ischemia, and since he has multiple severe co morbidities, surgical intervention should not be contemplated unless he develops a non-healing wound or limb threatening ischemia. Daily chair exercises for his legs  are recommended and demonstrated to patient and his son to encourage improved arterial circulation to legs and feet. Will obtain a carotid Duplex in addition to LE ABI's on his return in 6 months since he has a history of stroke, even though his stroke was associated with a hip fracture.  PLAN:  I discussed in depth with the patient the nature of atherosclerosis, and emphasized the importance of maximal medical management including strict control of blood pressure, blood glucose, and lipid levels, obtaining regular exercise, and cessation of smoking.  The patient is aware that without maximal medical management the underlying atherosclerotic disease process will progress, limiting the benefit of any interventions. Based on the patient's vascular studies and examination, pt will return to clinic in 6  months for ABI's and carotid Duplex.  The patient was given information about PAD and stroke prevention including signs, symptoms, treatment, what symptoms should prompt the patient to seek immediate medical care, and risk reduction measures to take.  Charisse March, RN, MSN, FNP-C [facility] Phone: [phone]  Clinic MD: Early  02/28/2013 9:30 AM

## 2013-03-07 ENCOUNTER — Encounter: Payer: Self-pay | Admitting: *Deleted

## 2013-03-10 IMAGING — CR DG SHOULDER 2+V*R*
3 series · 3 of 3 positions shown · non-contrast
Comparison: None.

CLINICAL DATA: Right-sided shoulder pain.

RIGHT SHOULDER - 2+ VIEW

[w shoulder ap internal right]
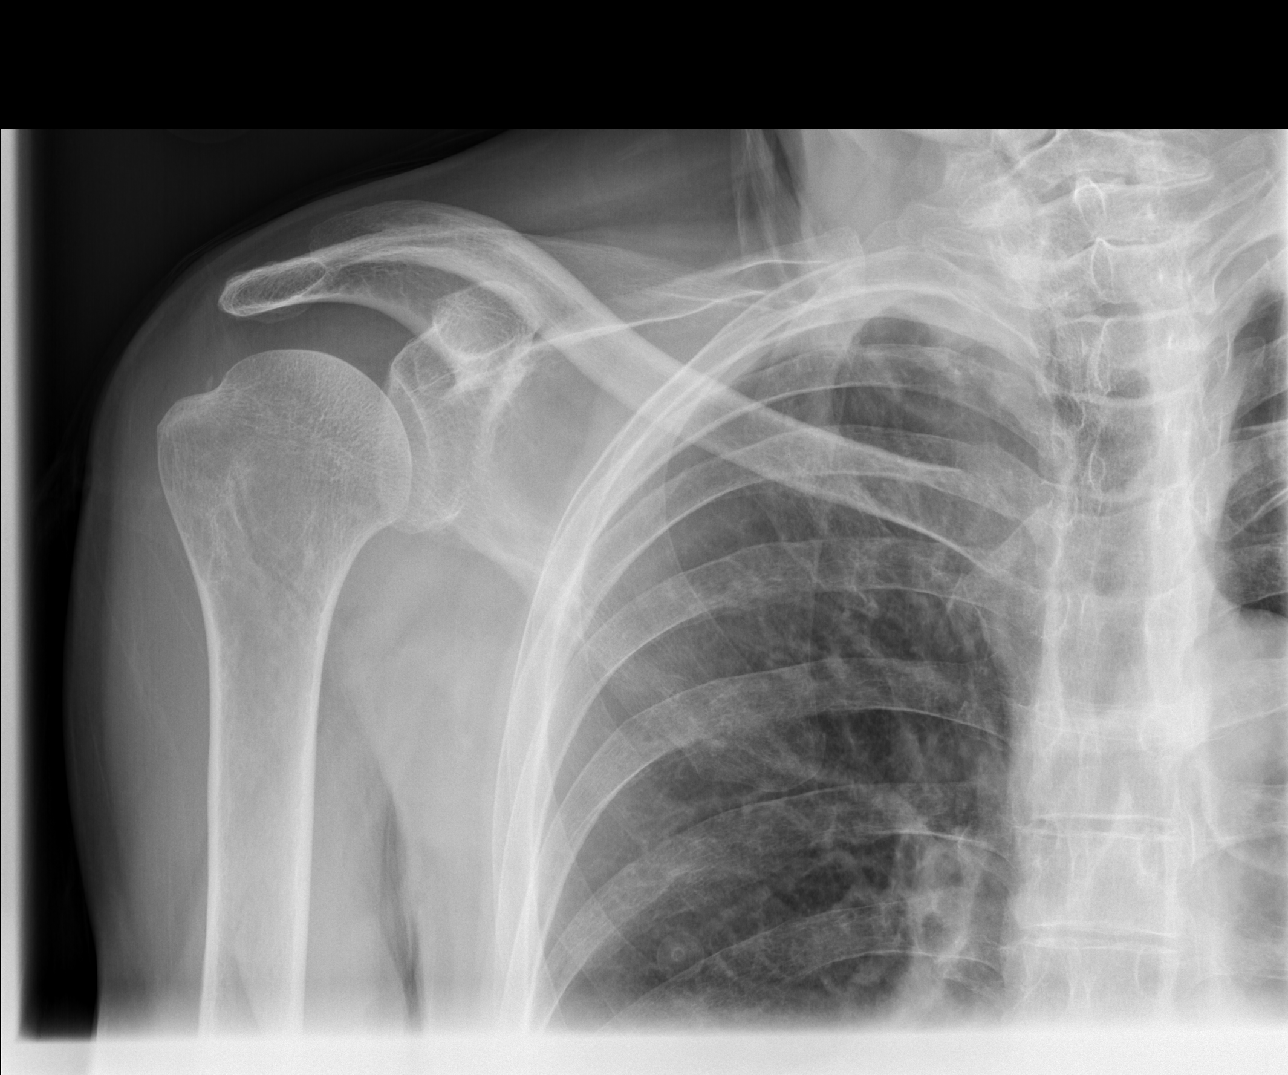

[w shoulder ap external right]
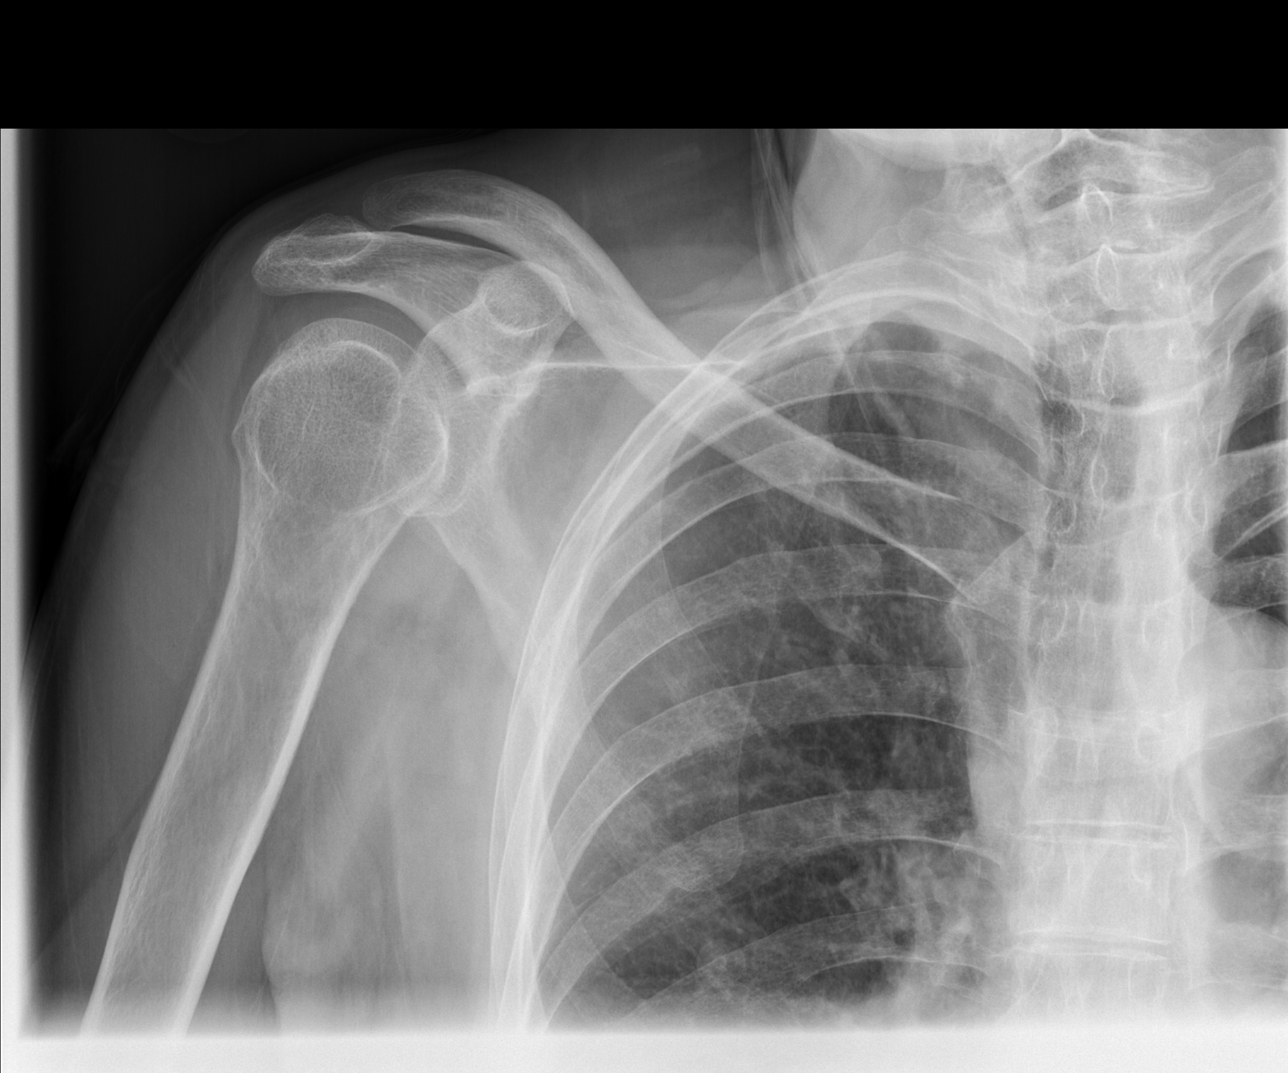

[w shoulder y view right]
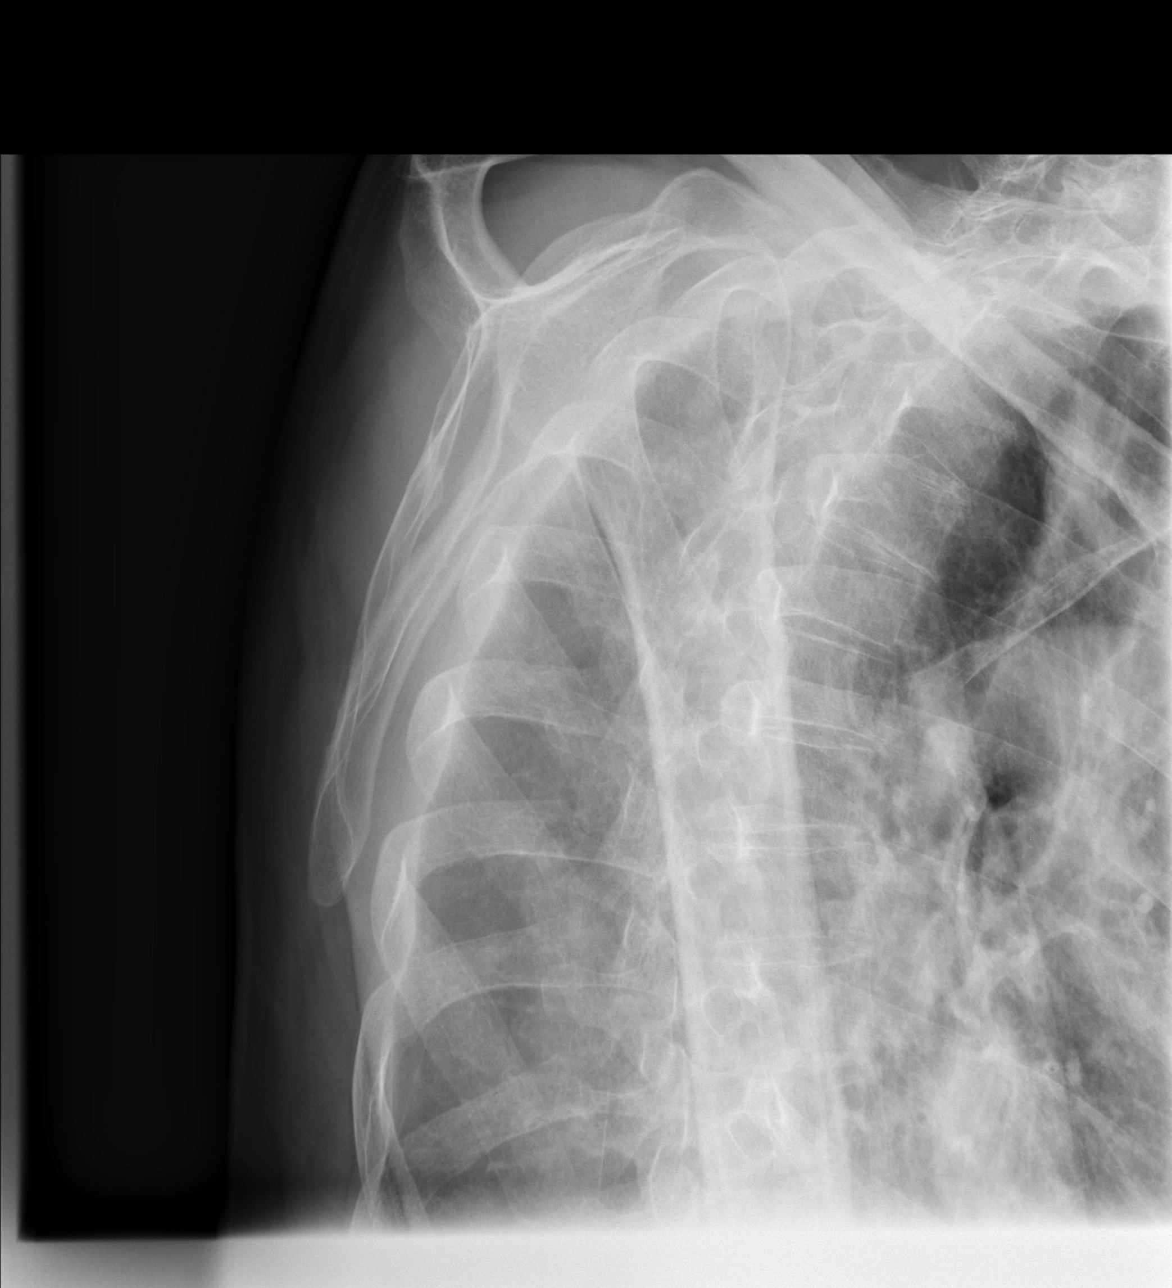

[3 of 3 positions shown; findings below may reference images not displayed]

FINDINGS: There is no evidence of fracture or dislocation.  There
is no evidence of arthropathy or other focal bone abnormality.
Minimal soft tissue calcification is seen near the rotator cuff
insertion site, suspicious for calcific tendonitis.
IMPRESSION: 1.  No acute findings.
2.  Mild soft tissue calcification near the rotator cuff insertion
site, suspicious for calcific tendonitis.

## 2013-03-11 IMAGING — US US RENAL
1 series · 14 of 25 positions shown · non-contrast
Comparison: Ultrasound 02/02/2009

CLINICAL DATA: Elevated creatinine.

RENAL/URINARY TRACT ULTRASOUND COMPLETE

[Series 1: us renal · 0.26mm/px · 14 of 27 slices shown]
[im 1/27]
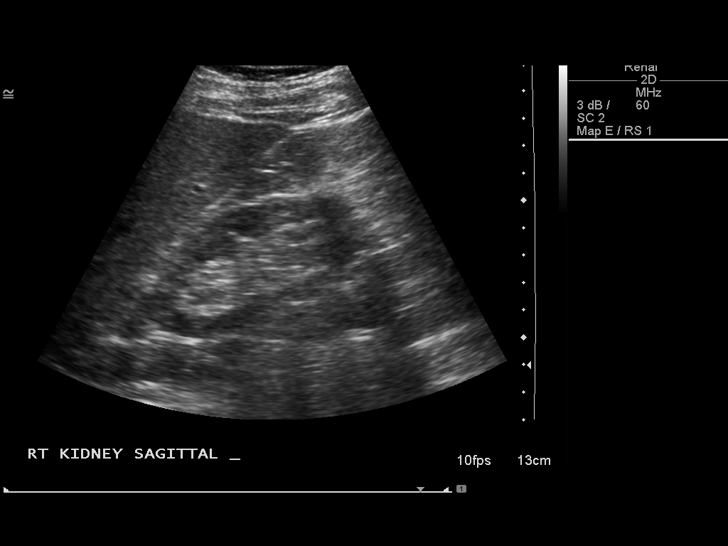
[im 3/27]
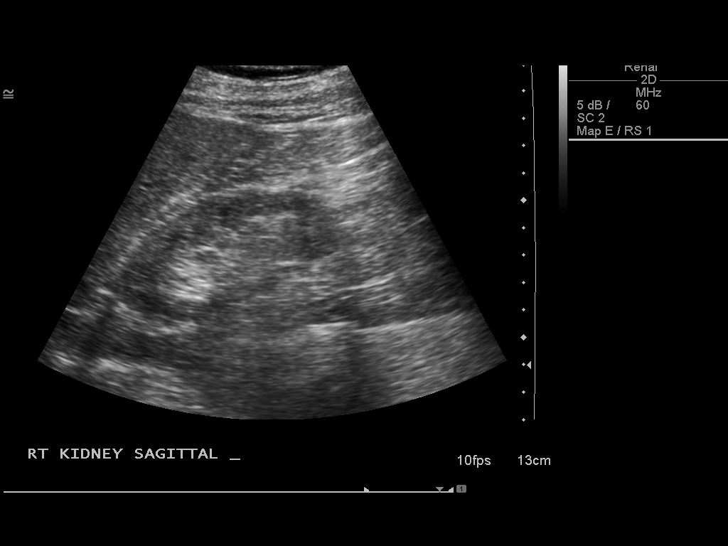
[im 5/27]
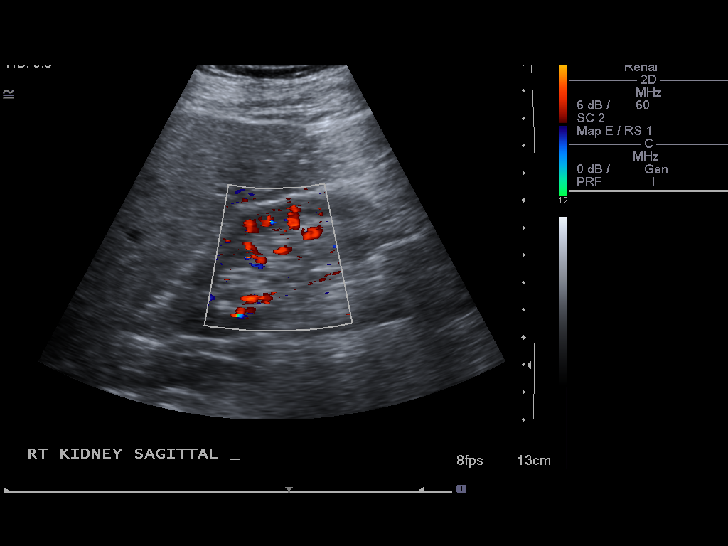
[im 7/27]
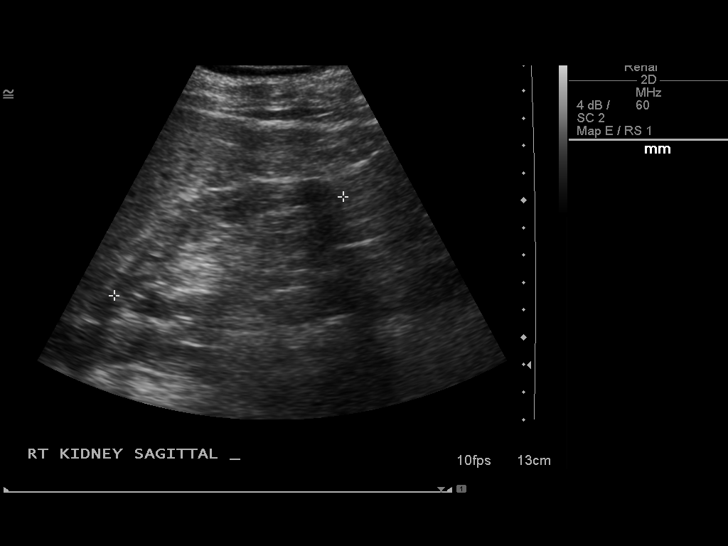
[im 9/27]
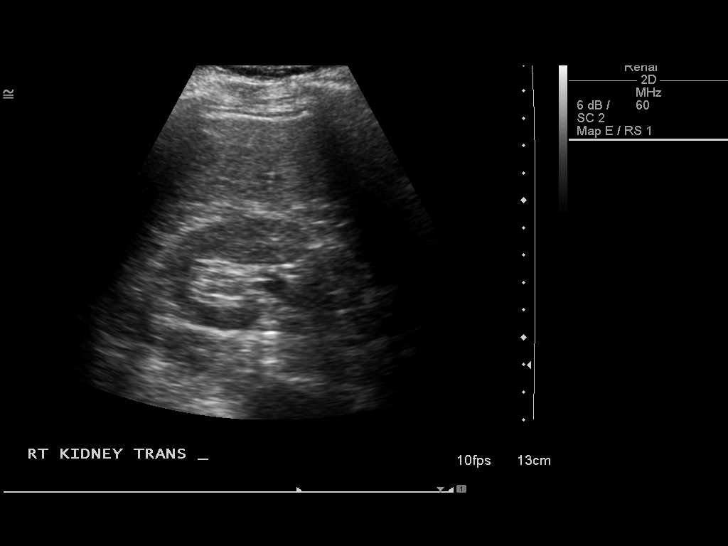
[im 10/27]
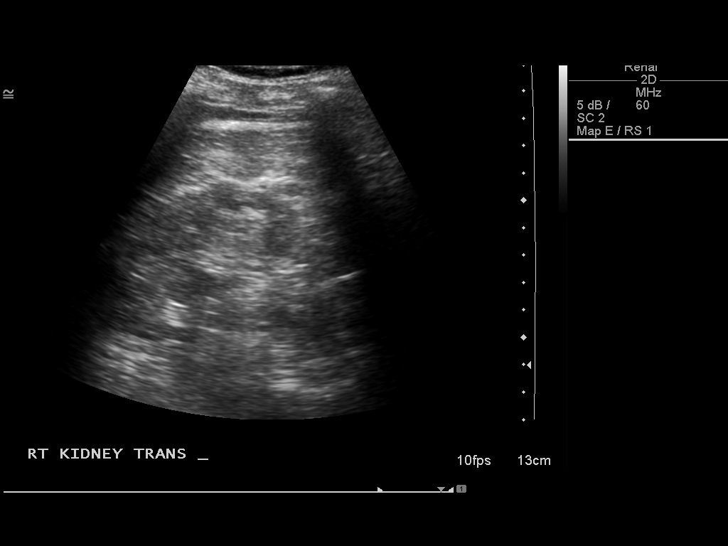
[im 12/27]
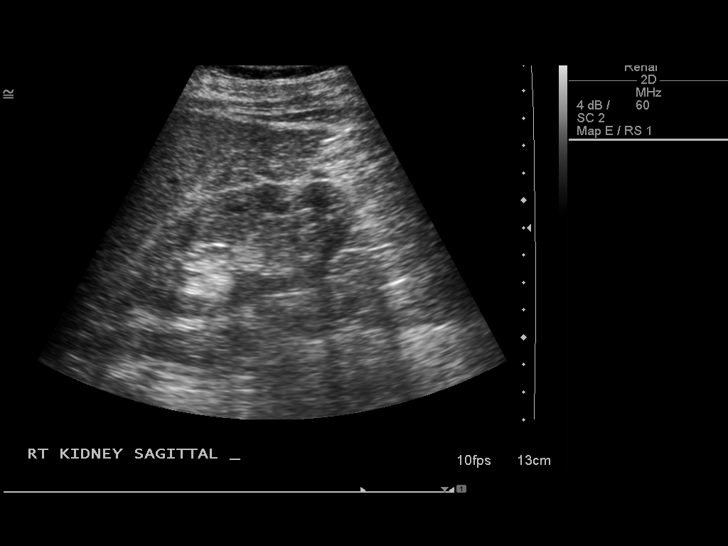
[im 15/27]
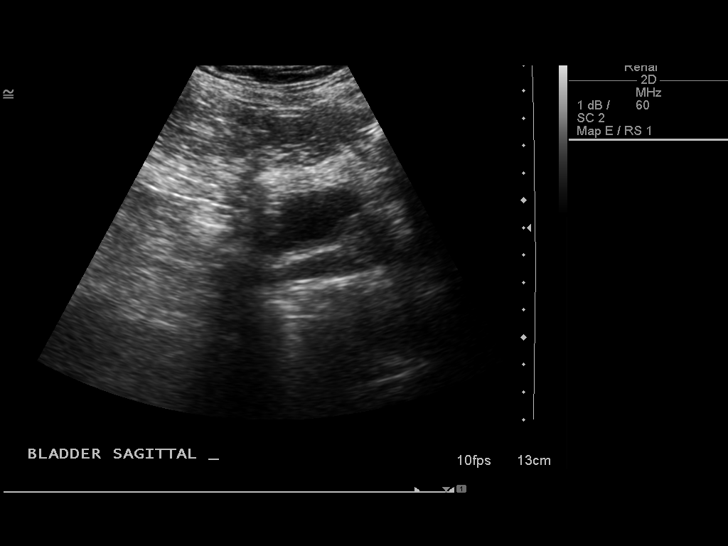
[im 17/27]
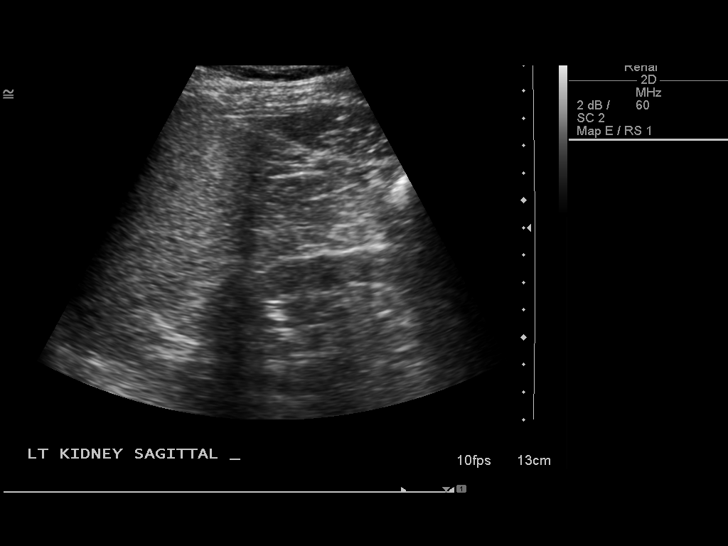
[im 18/27]
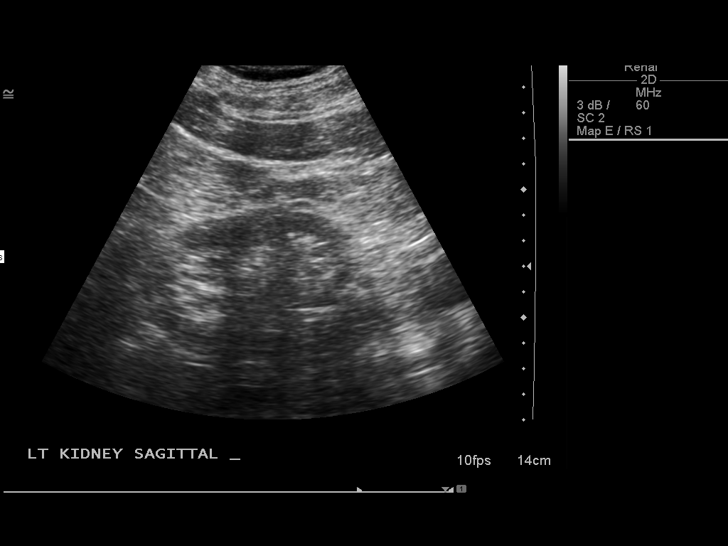
[im 20/27]
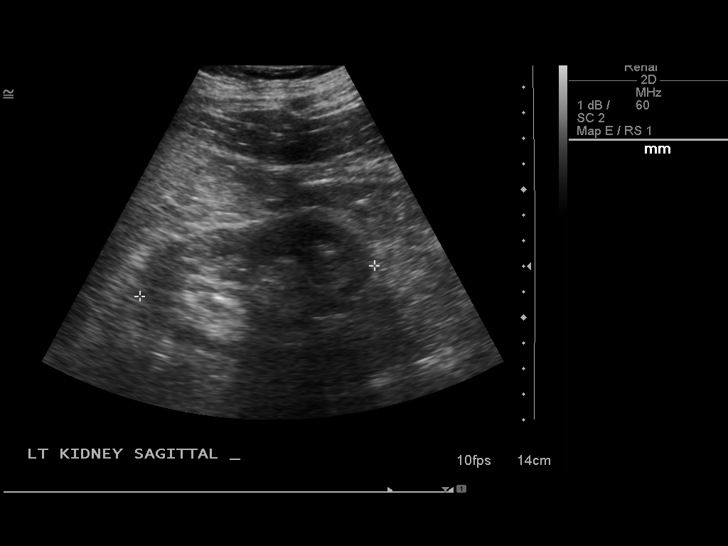
[im 22/27]
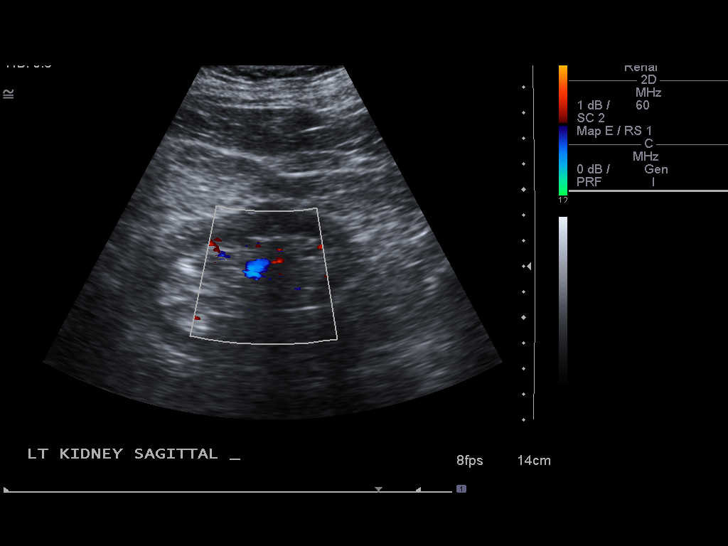
[im 24/27]
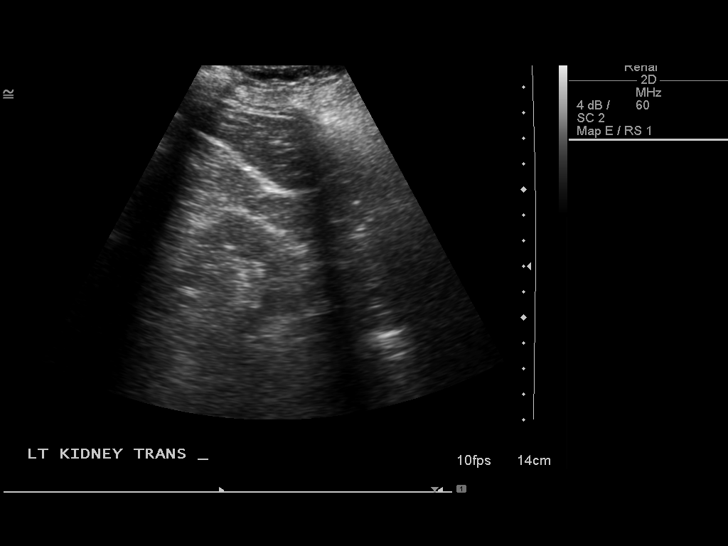
[im 27/27]
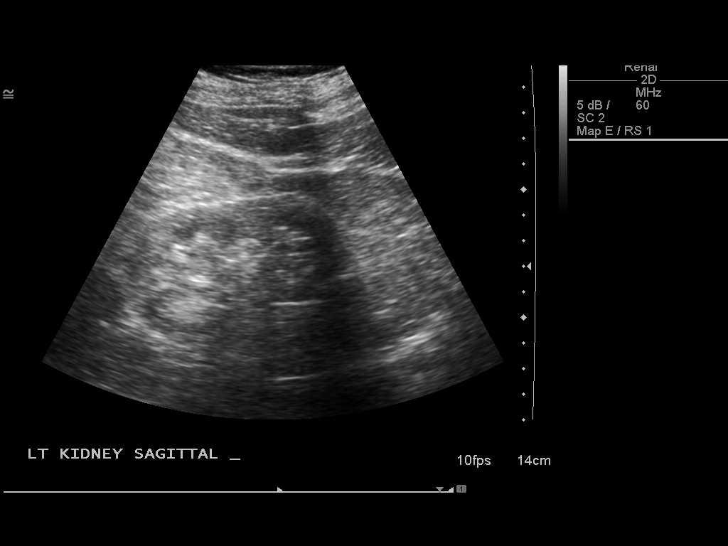

[14 of 25 positions shown; findings below may reference images not displayed]

FINDINGS: Right Kidney:  9.1 cm.  Normal echotexture.  No hydronephrosis.
Slight cortical thinning.

Left Kidney:  9.3 cm.  Normal echotexture.  No hydronephrosis.
Slight cortical thinning.

Bladder:  Normal for degree distention.
IMPRESSION: Slight cortical thinning within the kidneys bilaterally.  No
hydronephrosis or acute findings.

## 2013-04-18 ENCOUNTER — Other Ambulatory Visit (HOSPITAL_COMMUNITY): Payer: Self-pay | Admitting: Internal Medicine

## 2013-04-18 ENCOUNTER — Ambulatory Visit
Admission: RE | Admit: 2013-04-18 | Discharge: 2013-04-18 | Disposition: A | Discharge status: Home / Self Care | Payer: Commercial Managed Care - HMO | Source: Ambulatory Visit | Attending: Internal Medicine | Admitting: Internal Medicine

## 2013-04-18 ENCOUNTER — Other Ambulatory Visit: Payer: Self-pay | Admitting: Internal Medicine

## 2013-04-18 DIAGNOSIS — R110 Nausea: Secondary | ICD-10-CM

## 2013-04-18 DIAGNOSIS — R05 Cough: Secondary | ICD-10-CM

## 2013-04-19 ENCOUNTER — Inpatient Hospital Stay (HOSPITAL_COMMUNITY)
Admission: EM | Admit: 2013-04-19 | Discharge: 2013-04-23 | DRG: 444 | Disposition: A | Discharge status: Home Health | Payer: Medicare HMO | Attending: Internal Medicine | Admitting: Internal Medicine

## 2013-04-19 ENCOUNTER — Emergency Department (HOSPITAL_COMMUNITY): Payer: Medicare HMO

## 2013-04-19 ENCOUNTER — Other Ambulatory Visit: Payer: Self-pay

## 2013-04-19 ENCOUNTER — Encounter (HOSPITAL_COMMUNITY): Payer: Self-pay | Admitting: Emergency Medicine

## 2013-04-19 ENCOUNTER — Other Ambulatory Visit: Payer: Self-pay | Admitting: Internal Medicine

## 2013-04-19 ENCOUNTER — Ambulatory Visit (HOSPITAL_COMMUNITY)
Admission: RE | Admit: 2013-04-19 | Discharge: 2013-04-19 | Disposition: A | Discharge status: Home / Self Care | Payer: Medicare HMO | Source: Ambulatory Visit | Attending: Internal Medicine | Admitting: Internal Medicine

## 2013-04-19 ENCOUNTER — Ambulatory Visit (HOSPITAL_COMMUNITY): Payer: Medicare HMO

## 2013-04-19 DIAGNOSIS — I129 Hypertensive chronic kidney disease with stage 1 through stage 4 chronic kidney disease, or unspecified chronic kidney disease: Secondary | ICD-10-CM | POA: Diagnosis present

## 2013-04-19 DIAGNOSIS — I429 Cardiomyopathy, unspecified: Secondary | ICD-10-CM | POA: Diagnosis present

## 2013-04-19 DIAGNOSIS — E860 Dehydration: Secondary | ICD-10-CM | POA: Diagnosis present

## 2013-04-19 DIAGNOSIS — I639 Cerebral infarction, unspecified: Secondary | ICD-10-CM | POA: Diagnosis present

## 2013-04-19 DIAGNOSIS — Z86711 Personal history of pulmonary embolism: Secondary | ICD-10-CM

## 2013-04-19 DIAGNOSIS — J9601 Acute respiratory failure with hypoxia: Secondary | ICD-10-CM | POA: Diagnosis present

## 2013-04-19 DIAGNOSIS — Z7982 Long term (current) use of aspirin: Secondary | ICD-10-CM

## 2013-04-19 DIAGNOSIS — K859 Acute pancreatitis without necrosis or infection, unspecified: Secondary | ICD-10-CM | POA: Diagnosis present

## 2013-04-19 DIAGNOSIS — K810 Acute cholecystitis: Secondary | ICD-10-CM

## 2013-04-19 DIAGNOSIS — M719 Bursopathy, unspecified: Secondary | ICD-10-CM | POA: Diagnosis present

## 2013-04-19 DIAGNOSIS — L299 Pruritus, unspecified: Secondary | ICD-10-CM | POA: Diagnosis present

## 2013-04-19 DIAGNOSIS — K219 Gastro-esophageal reflux disease without esophagitis: Secondary | ICD-10-CM | POA: Diagnosis present

## 2013-04-19 DIAGNOSIS — R1310 Dysphagia, unspecified: Secondary | ICD-10-CM | POA: Diagnosis present

## 2013-04-19 DIAGNOSIS — K838 Other specified diseases of biliary tract: Secondary | ICD-10-CM

## 2013-04-19 DIAGNOSIS — I2510 Atherosclerotic heart disease of native coronary artery without angina pectoris: Secondary | ICD-10-CM | POA: Diagnosis present

## 2013-04-19 DIAGNOSIS — R109 Unspecified abdominal pain: Secondary | ICD-10-CM | POA: Diagnosis present

## 2013-04-19 DIAGNOSIS — R110 Nausea: Secondary | ICD-10-CM | POA: Insufficient documentation

## 2013-04-19 DIAGNOSIS — N183 Chronic kidney disease, stage 3 unspecified: Secondary | ICD-10-CM | POA: Diagnosis present

## 2013-04-19 DIAGNOSIS — Z8701 Personal history of pneumonia (recurrent): Secondary | ICD-10-CM

## 2013-04-19 DIAGNOSIS — Z79899 Other long term (current) drug therapy: Secondary | ICD-10-CM

## 2013-04-19 DIAGNOSIS — I82409 Acute embolism and thrombosis of unspecified deep veins of unspecified lower extremity: Secondary | ICD-10-CM | POA: Diagnosis present

## 2013-04-19 DIAGNOSIS — M67919 Unspecified disorder of synovium and tendon, unspecified shoulder: Secondary | ICD-10-CM | POA: Diagnosis present

## 2013-04-19 DIAGNOSIS — Z87448 Personal history of other diseases of urinary system: Secondary | ICD-10-CM

## 2013-04-19 DIAGNOSIS — I69991 Dysphagia following unspecified cerebrovascular disease: Secondary | ICD-10-CM

## 2013-04-19 DIAGNOSIS — Z96649 Presence of unspecified artificial hip joint: Secondary | ICD-10-CM

## 2013-04-19 DIAGNOSIS — I428 Other cardiomyopathies: Secondary | ICD-10-CM | POA: Diagnosis present

## 2013-04-19 DIAGNOSIS — E7800 Pure hypercholesterolemia, unspecified: Secondary | ICD-10-CM | POA: Diagnosis present

## 2013-04-19 DIAGNOSIS — E872 Acidosis: Secondary | ICD-10-CM

## 2013-04-19 DIAGNOSIS — I214 Non-ST elevation (NSTEMI) myocardial infarction: Secondary | ICD-10-CM | POA: Diagnosis present

## 2013-04-19 DIAGNOSIS — G8190 Hemiplegia, unspecified affecting unspecified side: Secondary | ICD-10-CM | POA: Diagnosis present

## 2013-04-19 DIAGNOSIS — J45909 Unspecified asthma, uncomplicated: Secondary | ICD-10-CM | POA: Diagnosis present

## 2013-04-19 DIAGNOSIS — K8309 Other cholangitis: Secondary | ICD-10-CM | POA: Diagnosis present

## 2013-04-19 DIAGNOSIS — Z86718 Personal history of other venous thrombosis and embolism: Secondary | ICD-10-CM

## 2013-04-19 DIAGNOSIS — I739 Peripheral vascular disease, unspecified: Secondary | ICD-10-CM | POA: Diagnosis present

## 2013-04-19 DIAGNOSIS — K56609 Unspecified intestinal obstruction, unspecified as to partial versus complete obstruction: Secondary | ICD-10-CM | POA: Diagnosis present

## 2013-04-19 DIAGNOSIS — IMO0002 Reserved for concepts with insufficient information to code with codable children: Secondary | ICD-10-CM

## 2013-04-19 DIAGNOSIS — I959 Hypotension, unspecified: Secondary | ICD-10-CM | POA: Diagnosis present

## 2013-04-19 DIAGNOSIS — K5909 Other constipation: Secondary | ICD-10-CM | POA: Diagnosis present

## 2013-04-19 DIAGNOSIS — K3189 Other diseases of stomach and duodenum: Secondary | ICD-10-CM | POA: Diagnosis present

## 2013-04-19 DIAGNOSIS — I252 Old myocardial infarction: Secondary | ICD-10-CM

## 2013-04-19 DIAGNOSIS — H3582 Retinal ischemia: Secondary | ICD-10-CM | POA: Diagnosis present

## 2013-04-19 DIAGNOSIS — K8301 Primary sclerosing cholangitis: Secondary | ICD-10-CM | POA: Diagnosis present

## 2013-04-19 DIAGNOSIS — I69391 Dysphagia following cerebral infarction: Secondary | ICD-10-CM

## 2013-04-19 DIAGNOSIS — I2699 Other pulmonary embolism without acute cor pulmonale: Secondary | ICD-10-CM | POA: Diagnosis present

## 2013-04-19 DIAGNOSIS — E43 Unspecified severe protein-calorie malnutrition: Secondary | ICD-10-CM | POA: Diagnosis present

## 2013-04-19 DIAGNOSIS — D638 Anemia in other chronic diseases classified elsewhere: Secondary | ICD-10-CM | POA: Diagnosis present

## 2013-04-19 DIAGNOSIS — N289 Disorder of kidney and ureter, unspecified: Secondary | ICD-10-CM | POA: Diagnosis present

## 2013-04-19 DIAGNOSIS — K8050 Calculus of bile duct without cholangitis or cholecystitis without obstruction: Principal | ICD-10-CM | POA: Diagnosis present

## 2013-04-19 LAB — HEPATIC FUNCTION PANEL
ALT: 109 U/L — ABNORMAL HIGH (ref 0–53)
Albumin: 2.7 g/dL — ABNORMAL LOW (ref 3.5–5.2)
Indirect Bilirubin: 1.7 mg/dL — ABNORMAL HIGH (ref 0.3–0.9)
Total Protein: 8 g/dL (ref 6.0–8.3)

## 2013-04-19 LAB — CBC WITH DIFFERENTIAL/PLATELET
Basophils Absolute: 0 10*3/uL (ref 0.0–0.1)
Basophils Relative: 0 % (ref 0–1)
Lymphocytes Relative: 6 % — ABNORMAL LOW (ref 12–46)
MCHC: 33.2 g/dL (ref 30.0–36.0)
Monocytes Absolute: 0.6 10*3/uL (ref 0.1–1.0)
Neutro Abs: 8.5 10*3/uL — ABNORMAL HIGH (ref 1.7–7.7)
Neutrophils Relative %: 88 % — ABNORMAL HIGH (ref 43–77)
Platelets: 221 10*3/uL (ref 150–400)
RDW: 20.5 % — ABNORMAL HIGH (ref 11.5–15.5)
WBC: 9.6 10*3/uL (ref 4.0–10.5)

## 2013-04-19 LAB — BASIC METABOLIC PANEL
Chloride: 106 mEq/L (ref 96–112)
Creatinine, Ser: 1.46 mg/dL — ABNORMAL HIGH (ref 0.50–1.35)
GFR calc Af Amer: 52 mL/min — ABNORMAL LOW (ref >90)
Sodium: 141 mEq/L (ref 135–145)

## 2013-04-19 LAB — LIPASE, BLOOD: Lipase: >3000 U/L — ABNORMAL HIGH (ref 11–59)

## 2013-04-19 LAB — CG4 I-STAT (LACTIC ACID): Lactic Acid, Venous: 4.05 mmol/L — ABNORMAL HIGH (ref 0.5–2.2)

## 2013-04-19 MED ORDER — CLOPIDOGREL BISULFATE 75 MG PO TABS
75.0000 mg | ORAL_TABLET | Freq: Every morning | ORAL | Status: DC
  Administered 2013-04-20 – 2013-04-23 (×4): 75 mg via ORAL
  Filled 2013-04-19 (×6): qty 1

## 2013-04-19 MED ORDER — SODIUM CHLORIDE 0.9 % IV BOLUS (SEPSIS)
1000.0000 mL | Freq: Once | INTRAVENOUS | Status: AC
  Administered 2013-04-19: 1000 mL via INTRAVENOUS

## 2013-04-19 MED ORDER — ALBUTEROL SULFATE HFA 108 (90 BASE) MCG/ACT IN AERS: 2.0000 | INHALATION_SPRAY | RESPIRATORY_TRACT | Status: DC | PRN

## 2013-04-19 MED ORDER — SODIUM CHLORIDE 0.9 % IV BOLUS (SEPSIS): 1000.0000 mL | Freq: Once | INTRAVENOUS | Status: DC

## 2013-04-19 MED ORDER — SODIUM CHLORIDE 0.9 % IV SOLN
INTRAVENOUS | Status: AC
  Administered 2013-04-19: 22:00:00 via INTRAVENOUS

## 2013-04-19 MED ORDER — MORPHINE SULFATE 2 MG/ML IJ SOLN: 1.0000 mg | INTRAMUSCULAR | Status: DC | PRN

## 2013-04-19 MED ORDER — SENNA 8.6 MG PO TABS
1.0000 | ORAL_TABLET | Freq: Two times a day (BID) | ORAL | Status: DC
  Administered 2013-04-20 – 2013-04-23 (×7): 8.6 mg via ORAL
  Filled 2013-04-19 (×6): qty 1

## 2013-04-19 MED ORDER — TAMSULOSIN HCL 0.4 MG PO CAPS
0.4000 mg | ORAL_CAPSULE | Freq: Every morning | ORAL | Status: DC
Start: 2013-04-20 — End: 2013-04-23
  Administered 2013-04-20 – 2013-04-23 (×4): 0.4 mg via ORAL
  Filled 2013-04-19 (×4): qty 1

## 2013-04-19 MED ORDER — ONDANSETRON HCL 4 MG/2ML IJ SOLN
4.0000 mg | Freq: Once | INTRAMUSCULAR | Status: AC
  Administered 2013-04-19: 4 mg via INTRAVENOUS
  Filled 2013-04-19: qty 2

## 2013-04-19 MED ORDER — HYDROMORPHONE HCL PF 1 MG/ML IJ SOLN
0.5000 mg | Freq: Once | INTRAMUSCULAR | Status: AC
  Administered 2013-04-19: 0.5 mg via INTRAVENOUS
  Filled 2013-04-19: qty 1

## 2013-04-19 MED ORDER — DOCUSATE SODIUM 100 MG PO CAPS
100.0000 mg | ORAL_CAPSULE | Freq: Two times a day (BID) | ORAL | Status: DC
  Administered 2013-04-20 – 2013-04-23 (×7): 100 mg via ORAL
  Filled 2013-04-19 (×9): qty 1

## 2013-04-19 MED ORDER — ASPIRIN EC 81 MG PO TBEC
81.0000 mg | DELAYED_RELEASE_TABLET | Freq: Every day | ORAL | Status: DC
  Administered 2013-04-20 – 2013-04-23 (×4): 81 mg via ORAL
  Filled 2013-04-19 (×4): qty 1

## 2013-04-19 MED ORDER — SODIUM CHLORIDE 0.9 % IV BOLUS (SEPSIS)
1000.0000 mL | Freq: Once | INTRAVENOUS | Status: AC
  Administered 2013-04-20: 1000 mL via INTRAVENOUS

## 2013-04-19 MED ORDER — NITROGLYCERIN 0.4 MG SL SUBL: 0.4000 mg | SUBLINGUAL_TABLET | SUBLINGUAL | Status: DC | PRN

## 2013-04-19 MED ORDER — SODIUM CHLORIDE 0.9 % IV SOLN
INTRAVENOUS | Status: DC
  Administered 2013-04-20: 13:00:00 via INTRAVENOUS

## 2013-04-19 MED ORDER — CITALOPRAM HYDROBROMIDE 20 MG PO TABS
20.0000 mg | ORAL_TABLET | Freq: Every morning | ORAL | Status: DC
  Administered 2013-04-20 – 2013-04-23 (×4): 20 mg via ORAL
  Filled 2013-04-19 (×4): qty 1

## 2013-04-19 MED ORDER — PANTOPRAZOLE SODIUM 40 MG PO TBEC
40.0000 mg | DELAYED_RELEASE_TABLET | Freq: Every morning | ORAL | Status: DC
  Administered 2013-04-20 – 2013-04-23 (×4): 40 mg via ORAL
  Filled 2013-04-19 (×4): qty 1

## 2013-04-19 MED ORDER — ENOXAPARIN SODIUM 40 MG/0.4ML ~~LOC~~ SOLN
40.0000 mg | SUBCUTANEOUS | Status: DC
  Filled 2013-04-19: qty 0.4

## 2013-04-19 MED ORDER — SODIUM CHLORIDE 0.9 % IV BOLUS (SEPSIS): 500.0000 mL | Freq: Once | INTRAVENOUS | Status: DC

## 2013-04-19 NOTE — ED Notes (Signed)
Dr. Goldston at bedside.  

## 2013-04-19 NOTE — ED Notes (Signed)
Patient back from CT Report given to Ord, RN on [facility] All questions answered Patient stable upon transport to [facility]

## 2013-04-19 NOTE — H&P (Addendum)
[facility] History and Physical  [name] ZOX:[id number] DOB: 01-Jan-1934 DOA: 04/19/2013  Referring physician:  PCP: [name] Apo, MD  Specialists:   Chief Complaint: Abdominal pain, nausea/vomiting  HPI: [name] is a 79 y.o. BM PMHx  right intertrochanteric hip fracture February 2013, PE July 2012, S/P IVC placement 10/03/2011, PVD thrombosed R-L BPG, S/P lobectomy 10/05/2011, DVT 3/13, CVA February 2013 (residual Dysphasia), cardiomyopathy EF 40-50%, SBO lysis of adhesions February 2013, Hx acute respiratory failure with hypoxia, renal insufficiency,Hx NSTEMI. Patient sees [name]. Per Dr. Oletha Cruel GI contacted concerning patient:, last seen by Dr. Willis Modena 09/27/2011 GI bleed.  Presented with vomiting and abdominal pain over the past 3-4 days. The patient has become more dehydrated per the family has become more weak to the point that he can't walk. He's had multiple bowel obstructions in the past. He went to his primary doctor today and they ordered an ultrasound which the family stated he had a "bowel obstruction". A formal ultrasound read in the ED shows concern for dilated common bile duct consistent with possible choledocholithiasis. Patient states he had a normal bowel movement yesterday. The patient felt warm to the touch to the family today but otherwise they have not noticed any fevers. Patient has not been having any altered mental status. She states began pruritis 6 weeks ago, and positive abdominal pain 3 weeks ago increasing episodes of nausea. States informed Dr. Renford Dills, and was instructed to have an abdominal ultrasound today (see below for results). States starting Sunday began having intractable nausea and vomiting has not been in to keep any fluids or solids down, positive fatigue/weakness positive increasing abdominal pain currently rated at 6/10    Review of Systems: The patient denies anorexia, fever, weight loss,, vision loss,  decreased hearing, hoarseness, chest pain, syncope, dyspnea on exertion, peripheral edema, balance deficits, hemoptysis, abdominal pain, melena, hematochezia, severe indigestion/heartburn, hematuria, incontinence, genital sores, muscle weakness, suspicious skin lesions, transient blindness, difficulty walking, depression, unusual weight change, abnormal bleeding, enlarged lymph nodes, angioedema, and breast masses.    TRAVEL HISTORY: None   Procedure CT abdomen pelvis without contrast 04/19/2013 1. New biliary dilatation to level of the papilla. No visible cause  by CT, reference previous sonography.  2. Chronic findings stable from prior and noted above.  Ultrasound of abdomen 04/19/2013 1. No cholelithiasis. Dilated common bile duct measuring 16.1 mm  with intrahepatic biliary ductal dilatation. There is no obstructing  mass identified. There is echogenic material within the distal  common bile duct which may be artifact versus choledocholithiasis.  Recommend further evaluation with MRCP.  CXR 04/19/2013 Small left pleural effusion with left basilar atelectasis. No  subdiaphragmatic free air.  Antibiotics Ceftriaxone 12/18>> Metronidazole 12/18>>   Past Medical History  Diagnosis Date  . Asthma   . Hypertension   . Pulmonary embolism   . Peripheral artery disease   . Hypercholesteremia   . Bursitis of right shoulder   . DVT of leg (deep venous thrombosis)   . Cough   . Trouble swallowing   . Change in voice   . Difficulty urinating   . Easy bruising   . CAD (coronary artery disease) 10/02/2011    Severe 2 Vessel CAD with early Mid LAD 95% focal lesion followed by 60-70% irregular stenoses just after bifurcating SP1 and AV Groove Cx-OM1/OM2 bifurcation 90% lesion  . Stroke Feb. 2013  . Pneumonia Feb. 2013  . Myocardial infarction Sep 26, 2011  . Fall at home Sept. 2,  2014    Pt fell down 2 stairs   Past Surgical History  Procedure Laterality Date  . Hernia repair     . Appendectomy    . Femoral-femoral bypass graft    . Iliac artery stent    . Esophagogastroduodenoscopy  07/03/2011    Procedure: ESOPHAGOGASTRODUODENOSCOPY (EGD);  Surgeon: Shirley Friar, MD;  Location: [facility];  Service: Endoscopy;  Laterality: N/A;  . Compression hip screw  06/26/2011    Procedure: COMPRESSION HIP;  Surgeon: Raymon Mutton, MD;  Location: [facility];  Service: Orthopedics;  Laterality: Right;  . Laparoscopy  07/10/2011    Procedure: LAPAROSCOPY DIAGNOSTIC;  Surgeon: Ardeth Sportsman, MD;  Location: [facility];  Service: General;  Laterality: N/A;  serousal repair,  . Esophagogastroduodenoscopy  09/28/2011    Procedure: ESOPHAGOGASTRODUODENOSCOPY (EGD);  Surgeon: Willis Modena, MD;  Location: [facility];  Service: Endoscopy;  Laterality: N/A;  . Femoral-femoral bypass graft  10/05/2011    Procedure: BYPASS GRAFT FEMORAL-FEMORAL ARTERY;  Surgeon: Larina Earthly, MD;  Location: [facility] OR;  Service: Vascular;  Laterality: Bilateral;  Thrombectomy of Femoral-Femoral artery bypass graft  . Fracture surgery  06/24/11    Right Hip  . Joint replacement  06/24/11    Right Hip  . Small intestine surgery  March 2013   Social History:  1 ppd x 10 yrs stopped 19 years ago. He has never used smokeless tobacco. He reports that he does not drink alcohol or use illicit drugs. where does patient live--home, ALF, SNF? and with whom if at home? Can patient participate in ADLs?  No Known Allergies  Family History  Problem Relation Age of Onset  . Hyperlipidemia Father   . Deep vein thrombosis Father      Prior to Admission medications   Medication Sig Start Date End Date Taking? Authorizing Provider  aspirin EC 81 MG tablet Take 81 mg by mouth every evening.    Yes Historical Provider, MD  citalopram (CELEXA) 20 MG tablet Take 20 mg by mouth every morning.  07/27/12  Yes Historical Provider, MD  clopidogrel (PLAVIX) 75 MG tablet Take 75 mg by mouth every morning.    Yes Historical Provider,  MD  diltiazem (DILACOR XR) 180 MG 24 hr capsule Take 180 mg by mouth every morning.    Yes Historical Provider, MD  isosorbide mononitrate (IMDUR) 30 MG 24 hr tablet Take 30 mg by mouth every morning.    Yes Historical Provider, MD  lactose free nutrition (BOOST) LIQD Take 237 mLs by mouth 3 (three) times daily.   Yes Historical Provider, MD  metoprolol (LOPRESSOR) 100 MG tablet Take 100 mg by mouth 2 (two) times daily.   Yes Historical Provider, MD  Multiple Vitamin (MULTIVITAMIN WITH MINERALS) TABS tablet Take 1 tablet by mouth every morning.    Yes Historical Provider, MD  nitroGLYCERIN (NITROSTAT) 0.4 MG SL tablet Place 0.4 mg under the tongue every 5 (five) minutes as needed for chest pain.   Yes Historical Provider, MD  pantoprazole (PROTONIX) 40 MG tablet Take 40 mg by mouth every morning.    Yes Historical Provider, MD  polyethylene glycol (MIRALAX / GLYCOLAX) packet Take 17 g by mouth daily as needed for moderate constipation.   Yes Historical Provider, MD  simvastatin (ZOCOR) 10 MG tablet Take 10 mg by mouth every evening.    Yes Historical Provider, MD  Tamsulosin HCl (FLOMAX) 0.4 MG CAPS Take 0.4 mg by mouth every morning.    Yes Historical Provider,  MD  VENTOLIN HFA 108 (90 BASE) MCG/ACT inhaler Inhale 2 puffs into the lungs every 4 (four) hours as needed for wheezing or shortness of breath.  01/16/13  Yes Historical Provider, MD  vitamin C (ASCORBIC ACID) 500 MG tablet Take 500 mg by mouth every morning.    Yes Historical Provider, MD  zinc sulfate 220 MG capsule Take 220 mg by mouth every morning.   Yes Historical Provider, MD   Physical Exam: Filed Vitals:   04/19/13 2000 04/19/13 2030 04/19/13 2100 04/19/13 2130  BP: 98/58 116/62 110/72 109/60  Pulse: 133 110 99 104  Temp:      TempSrc:      Resp: 29 37 24 22  SpO2: 99% 93% 98% 100%     General: A./O. x4, mild abdominal distress rates pain at 6/10   Eyes: Pupils equal round reactive to light and accommodation, negative  jaundice  Neck: Negative JVD  Cardiovascular: Regular rhythm and rate, negative murmurs rubs or gallops, DP/PT pulse one plus bilateral  Respiratory: Clear to auscultation bilateral  Abdomen: Soft, positive abdominal pain to palpation RUQ/ periumbilical  Skin: Multiple healing excoriations on legs and on much from scratching;  seborrheic keratosis all entire back (patient states has had for 30 years no increase), positive tenting of skin  Musculoskeletal: Negative pedal edema, negative cyanosis,  Neurologic: Cranial nerve II through XII intact, tongue/uvula midline, all extremity strength 5/5, sensation intact throughout, did not ambulate patient   Labs on Admission:  Basic Metabolic Panel:  Recent Labs Lab 04/19/13 2000  NA 141  K 3.8  CL 106  CO2 20  GLUCOSE 123*  BUN 27*  CREATININE 1.46*  CALCIUM 9.6   Liver Function Tests:  Recent Labs Lab 04/19/13 2000  AST 97*  ALT 109*  ALKPHOS 505*  BILITOT 7.6*  PROT 8.0  ALBUMIN 2.7*   No results found for this basename: LIPASE, AMYLASE,  in the last 168 hours No results found for this basename: AMMONIA,  in the last 168 hours CBC:  Recent Labs Lab 04/19/13 2000  WBC 9.6  NEUTROABS 8.5*  HGB 10.9*  HCT 32.8*  MCV 80.8  PLT 221   Cardiac Enzymes: No results found for this basename: CKTOTAL, CKMB, CKMBINDEX, TROPONINI,  in the last 168 hours  BNP (last 3 results) No results found for this basename: PROBNP,  in the last 8760 hours CBG: No results found for this basename: GLUCAP,  in the last 168 hours  Radiological Exams on Admission: Dg Chest 2 View  04/18/2013   CLINICAL DATA:  Cough, congestion  EXAM: CHEST  2 VIEW  COMPARISON:  None.  FINDINGS: There is chronic elevation of the right hemidiaphragm. No focal regions of consolidation nor focal infiltrates are appreciated. The cardiac silhouette is within normal limits. There is dextroscoliosis of the thoracolumbar spine.  IMPRESSION: Chronic elevation  right hemidiaphragm by evidence of acute cardiopulmonary disease.   Electronically Signed   By: Salome Holmes M.D.   On: 04/18/2013 11:33   US Abdomen Complete  04/19/2013   CLINICAL DATA:  Nausea  EXAM: ULTRASOUND ABDOMEN COMPLETE  COMPARISON:  None.  FINDINGS: Gallbladder:  No gallstones or wall thickening visualized. There is gallbladder sludge present. No sonographic Murphy sign noted.  Common bile duct:  Diameter: 16.1 mm  Liver:  There is mild intrahepatic biliary ductal dilatation. There is a 2.8 x 1.9 x 2.5 cm lobulated anechoic left hepatic mass most consistent with a cyst which was visualized on prior CT  abdomen dated 07/06/2011.  IVC:  No abnormality visualized.  Pancreas:  Visualized portion unremarkable.  Spleen:  Size and appearance within normal limits.  Right Kidney:  Length: 9.9 cm. Echogenicity is increased. No mass or hydronephrosis visualized.  Left Kidney:  Length: 9.9 cm. Echogenicity is increased. No mass or hydronephrosis visualized.  Abdominal aorta:  No aneurysm visualized.  Other findings:  None.  IMPRESSION: 1. No cholelithiasis. Dilated common bile duct measuring 16.1 mm with intrahepatic biliary ductal dilatation. There is no obstructing mass identified. There is echogenic material within the distal common bile duct which may be artifact versus choledocholithiasis. Recommend further evaluation with MRCP.   Electronically Signed   By: Elige Ko   On: 04/19/2013 10:36   Dg Chest Port 1 View  04/19/2013   CLINICAL DATA:  Abdominal pain, weakness, rule out free abdominal air  EXAM: PORTABLE CHEST - 1 VIEW  COMPARISON:  04/18/2013  FINDINGS: Cardiomediastinal silhouette is stable. There is small left pleural effusion with blunting of the left costophrenic angle. No free abdominal air is noted in upper abdomen. No pulmonary edema.  IMPRESSION: Small left pleural effusion with left basilar atelectasis. No subdiaphragmatic free air.   Electronically Signed   By: Natasha Mead M.D.    On: 04/19/2013 20:42    EKG: When compared to EKG from 10/03/2011 patient now has sinus tachycardia, LVH,  Assessment/Plan Active Problems:   * No active [name] problems. *  Primary sclerosing cholangitis -[facility] has been notified by ED and plan to do an ERCP in the morning if stable -Continue fluid resuscitation  Pancreatitis -Continue NPO -Continue fluid resuscitation  Choledocholithiasis -[facility] has been notified -Bowel rest  Cholecystitis -Although patient does not have a leukocytosis his symptoms and ultrasound could also be consistent with cholecystitis. With his high lactic acid and hypotension will empirically treat. -Start ceftriaxone and metronidazole   Hypotension -Continue aggressive hydration  -May have to consider pressor if patient becomes symptomatically hypotensive,  Cardiomyopathy -Will have to continue aggressive hydration however secondary to patient's cardiac condition will have to diligently monitor for fluid overload. -If BP still unstable at that time may have to resort to vasopressors -Obtain proBNP  Pruritus  -Start Atarax  Renal insufficiency -Slightly worse than baseline (baseline= 1.03- 1.11); most likely secondary to hypovolemia -Continue to monitor after patient was fluid resuscitated   Code Status:Full Family Communication: No family available to discuss plan of care Disposition Plan: Per [facility]  Time spent: 120 minutes  WOODS, Roselind Messier [facility] Pager [phone]  If 7PM-7AM, please contact night-coverage [email or web address] Password [email or web address] 04/19/2013, 10:00 PM

## 2013-04-19 NOTE — ED Provider Notes (Signed)
CSN: [id number]     Arrival date & time 04/19/13  1938 History   First MD Initiated Contact with Patient 04/19/13 2010     Chief Complaint  Patient presents with  . Abdominal Pain   (Consider location/radiation/quality/duration/timing/severity/associated sxs/prior Treatment) HPI Comments: 79 year old male presents with vomiting and abdominal pain over the past 3-4 days. The patient has become more dehydrated per the family has become more weak to the point that he can't walk. He's had multiple bowel obstructions in the past. He went to his primary doctor today and they ordered an ultrasound which the family stated he had a "bowel obstruction". A formal ultrasound read in the ED shows concern for dilated common bile duct consistent with possible choledocholithiasis. Patient states he had a normal bowel movement yesterday. The patient felt warm to the touch to the family today but otherwise they have not noticed any fevers. Patient has not been having any altered mental status.   Past Medical History  Diagnosis Date  . Asthma   . Hypertension   . Pulmonary embolism   . Peripheral artery disease   . Hypercholesteremia   . Bursitis of right shoulder   . DVT of leg (deep venous thrombosis)   . Cough   . Trouble swallowing   . Change in voice   . Difficulty urinating   . Easy bruising   . CAD (coronary artery disease) 10/02/2011    Severe 2 Vessel CAD with early Mid LAD 95% focal lesion followed by 60-70% irregular stenoses just after bifurcating SP1 and AV Groove Cx-OM1/OM2 bifurcation 90% lesion  . Stroke Feb. 2013  . Pneumonia Feb. 2013  . Myocardial infarction Sep 26, 2011  . Fall at home Sept. 2, 2014    Pt fell down 2 stairs   Past Surgical History  Procedure Laterality Date  . Hernia repair    . Appendectomy    . Femoral-femoral bypass graft    . Iliac artery stent    . Esophagogastroduodenoscopy  07/03/2011    Procedure: ESOPHAGOGASTRODUODENOSCOPY (EGD);  Surgeon: Shirley Friar, MD;  Location: [facility];  Service: Endoscopy;  Laterality: N/A;  . Compression hip screw  06/26/2011    Procedure: COMPRESSION HIP;  Surgeon: Raymon Mutton, MD;  Location: [facility];  Service: Orthopedics;  Laterality: Right;  . Laparoscopy  07/10/2011    Procedure: LAPAROSCOPY DIAGNOSTIC;  Surgeon: Ardeth Sportsman, MD;  Location: [facility];  Service: General;  Laterality: N/A;  serousal repair,  . Esophagogastroduodenoscopy  09/28/2011    Procedure: ESOPHAGOGASTRODUODENOSCOPY (EGD);  Surgeon: Willis Modena, MD;  Location: [facility];  Service: Endoscopy;  Laterality: N/A;  . Femoral-femoral bypass graft  10/05/2011    Procedure: BYPASS GRAFT FEMORAL-FEMORAL ARTERY;  Surgeon: Larina Earthly, MD;  Location: [facility] OR;  Service: Vascular;  Laterality: Bilateral;  Thrombectomy of Femoral-Femoral artery bypass graft  . Fracture surgery  06/24/11    Right Hip  . Joint replacement  06/24/11    Right Hip  . Small intestine surgery  March 2013   Family History  Problem Relation Age of Onset  . Hyperlipidemia Father   . Deep vein thrombosis Father    History  Substance Use Topics  . Smoking status: Former Smoker    Quit date: 08/20/1993  . Smokeless tobacco: Never Used  . Alcohol Use: No    Review of Systems  Respiratory: Negative for shortness of breath.   Cardiovascular: Negative for chest pain.  Gastrointestinal: Positive for nausea, vomiting and abdominal  pain. Negative for constipation.  Genitourinary: Negative for dysuria.  Neurological: Positive for weakness.    Allergies  Review of patient's allergies indicates no known allergies.  Home Medications   Current Outpatient Rx  Name  Route  Sig  Dispense  Refill  . aspirin EC 81 MG tablet   Oral   Take 81 mg by mouth daily.         . citalopram (CELEXA) 20 MG tablet   Oral   Take 20 mg by mouth daily.          . clopidogrel (PLAVIX) 75 MG tablet   Oral   Take 75 mg by mouth daily.         Marland Kitchen diltiazem (DILACOR XR)  180 MG 24 hr capsule   Oral   Take 180 mg by mouth daily.         Marland Kitchen HYDROcodone-acetaminophen (NORCO/VICODIN) 5-325 MG per tablet   Oral   Take 1-2 tablets by mouth every 4 (four) hours as needed for pain.   20 tablet   0   . isosorbide mononitrate (IMDUR) 30 MG 24 hr tablet   Oral   Take 30 mg by mouth daily.         . metoprolol (LOPRESSOR) 100 MG tablet   Oral   Take 100 mg by mouth 2 (two) times daily.         . Multiple Vitamin (MULTIVITAMIN WITH MINERALS) TABS tablet   Oral   Take 1 tablet by mouth daily.         . pantoprazole (PROTONIX) 40 MG tablet   Oral   Take 40 mg by mouth daily.         . simvastatin (ZOCOR) 10 MG tablet   Oral   Take 10 mg by mouth every evening.          . Tamsulosin HCl (FLOMAX) 0.4 MG CAPS   Oral   Take 0.4 mg by mouth daily.         . VENTOLIN HFA 108 (90 BASE) MCG/ACT inhaler               . vitamin C (ASCORBIC ACID) 500 MG tablet   Oral   Take 500 mg by mouth 2 (two) times daily.         Marland Kitchen zinc gluconate 50 MG tablet   Oral   Take 50 mg by mouth daily.          BP 98/58  Pulse 133  Temp(Src) 99.2 F (37.3 C) (Oral)  Resp 29  SpO2 99% Physical Exam  Nursing note and vitals reviewed. Constitutional: He is oriented to person, place, and time. He appears well-developed and well-nourished.  HENT:  Head: Normocephalic and atraumatic.  Right Ear: External ear normal.  Left Ear: External ear normal.  Nose: Nose normal.  Eyes: Right eye exhibits no discharge. Left eye exhibits no discharge.  Jaundiced sclera  Neck: Neck supple.  Cardiovascular: Normal rate, regular rhythm, normal heart sounds and intact distal pulses.   Pulmonary/Chest: Effort normal.  Abdominal: Soft. There is tenderness in the right upper quadrant, epigastric area and periumbilical area. A hernia is present. Hernia confirmed positive in the ventral area (reducible).  Musculoskeletal: He exhibits no edema.  Neurological: He is alert  and oriented to person, place, and time.  Skin: Skin is warm and dry.    ED Course  Procedures (including critical care time) Labs Review Labs Reviewed  CBC WITH DIFFERENTIAL - Abnormal; Notable  for the following:    RBC 4.06 (*)    Hemoglobin 10.9 (*)    HCT 32.8 (*)    RDW 20.5 (*)    Neutrophils Relative % 88 (*)    Neutro Abs 8.5 (*)    Lymphocytes Relative 6 (*)    Lymphs Abs 0.5 (*)    All other components within normal limits  BASIC METABOLIC PANEL - Abnormal; Notable for the following:    Glucose, Bld 123 (*)    BUN 27 (*)    Creatinine, Ser 1.46 (*)    GFR calc non Af Amer 45 (*)    GFR calc Af Amer 52 (*)    All other components within normal limits  HEPATIC FUNCTION PANEL - Abnormal; Notable for the following:    Albumin 2.7 (*)    AST 97 (*)    ALT 109 (*)    Alkaline Phosphatase 505 (*)    Total Bilirubin 7.6 (*)    Bilirubin, Direct 5.9 (*)    Indirect Bilirubin 1.7 (*)    All other components within normal limits  LIPASE, BLOOD - Abnormal; Notable for the following:    Lipase >3000 (*)    All other components within normal limits  CG4 I-STAT (LACTIC ACID) - Abnormal; Notable for the following:    Lactic Acid, Venous 4.05 (*)    All other components within normal limits   Imaging Review Ct Abdomen Pelvis Wo Contrast  04/19/2013   CLINICAL DATA:  Bilateral abdominal pain, worse on the right. Vomiting.  EXAM: CT ABDOMEN AND PELVIS WITHOUT CONTRAST  TECHNIQUE: Multidetector CT imaging of the abdomen and pelvis was performed following the standard protocol without intravenous contrast.  COMPARISON:  07/06/2011  FINDINGS: BODY WALL: Status post ventral abdominal hernia repair with mesh.  LOWER CHEST: Streaky lower lung opacities is likely atelectasis.  ABDOMEN/PELVIS:  Liver:  Stable multiple cysts as noted on previous infused imaging.  Biliary: Newly dilated common bile duct, 14 mm in diameter. The duct is dilated to the ampulla. No radiodense  choledocholithiasis. No evidence of mass. No evidence of pancreatic ductal dilatation.  Pancreas: Unremarkable.  Spleen: Unremarkable.  Adrenals: Unremarkable.  Kidneys and ureters: No hydronephrosis. Punctate stone in the interpolar left kidney.  Bladder: Unremarkable.  Reproductive: Unremarkable.  Bowel: No obstruction. No pericecal inflammation. Colonic diverticulosis.  Retroperitoneum: No mass or adenopathy.  Peritoneum: No free fluid or gas.  Vascular: Status post bifem bypass. IVC filter present. Right aortoiliac stenting.  OSSEOUS: Distal portions of right femoral neck fixation hardware shows no acute abnormality. Degenerative disc disease with dextroscoliosis.  IMPRESSION: 1. New biliary dilatation to level of the papilla. No visible cause by CT, reference previous sonography. 2. Chronic findings stable from prior and noted above.   Electronically Signed   By: Tiburcio Pea M.D.   On: 04/19/2013 23:15   Dg Chest 2 View  04/18/2013   CLINICAL DATA:  Cough, congestion  EXAM: CHEST  2 VIEW  COMPARISON:  None.  FINDINGS: There is chronic elevation of the right hemidiaphragm. No focal regions of consolidation nor focal infiltrates are appreciated. The cardiac silhouette is within normal limits. There is dextroscoliosis of the thoracolumbar spine.  IMPRESSION: Chronic elevation right hemidiaphragm by evidence of acute cardiopulmonary disease.   Electronically Signed   By: Salome Holmes M.D.   On: 04/18/2013 11:33   US Abdomen Complete  04/19/2013   CLINICAL DATA:  Nausea  EXAM: ULTRASOUND ABDOMEN COMPLETE  COMPARISON:  None.  FINDINGS: Gallbladder:  No gallstones or wall thickening visualized. There is gallbladder sludge present. No sonographic Murphy sign noted.  Common bile duct:  Diameter: 16.1 mm  Liver:  There is mild intrahepatic biliary ductal dilatation. There is a 2.8 x 1.9 x 2.5 cm lobulated anechoic left hepatic mass most consistent with a cyst which was visualized on prior CT abdomen dated  07/06/2011.  IVC:  No abnormality visualized.  Pancreas:  Visualized portion unremarkable.  Spleen:  Size and appearance within normal limits.  Right Kidney:  Length: 9.9 cm. Echogenicity is increased. No mass or hydronephrosis visualized.  Left Kidney:  Length: 9.9 cm. Echogenicity is increased. No mass or hydronephrosis visualized.  Abdominal aorta:  No aneurysm visualized.  Other findings:  None.  IMPRESSION: 1. No cholelithiasis. Dilated common bile duct measuring 16.1 mm with intrahepatic biliary ductal dilatation. There is no obstructing mass identified. There is echogenic material within the distal common bile duct which may be artifact versus choledocholithiasis. Recommend further evaluation with MRCP.   Electronically Signed   By: Elige Ko   On: 04/19/2013 10:36   Dg Chest Port 1 View  04/19/2013   CLINICAL DATA:  Abdominal pain, weakness, rule out free abdominal air  EXAM: PORTABLE CHEST - 1 VIEW  COMPARISON:  04/18/2013  FINDINGS: Cardiomediastinal silhouette is stable. There is small left pleural effusion with blunting of the left costophrenic angle. No free abdominal air is noted in upper abdomen. No pulmonary edema.  IMPRESSION: Small left pleural effusion with left basilar atelectasis. No subdiaphragmatic free air.   Electronically Signed   By: Natasha Mead M.D.   On: 04/19/2013 20:42    EKG Interpretation   None       MDM   1. Abdominal pain   2. Choledocholithiasis    Patient with soft, nonsurgical abd. Easily reducible hernia. Sx most c/w choledocholithiasis. D/w [facility], who recommends fluids, CT to r/o mass and admission to hospitalist. Does not feel he needs abx at this time. Will admit to hospitalist for MRCP or ERCP in AM.    Audree Camel, MD 04/20/13 [phone]

## 2013-04-19 NOTE — ED Notes (Signed)
Pt brought in by son tonight for c/o failure to thrive and abd pain  Son states pt has not been eating, been very weak, vomiting, and constipated  Pt was seen by his PCP  Dr Nehemiah Settle with [facility] and was sent here this morning to the radiology dept for some tests  Son states they were called and told the pt has a bowel obstruction and needed to follow up with his dr on Monday  Son says he does not think it can wait til Monday because the pt has continued to have vomiting today   Pt states his last BM was yesterday morning  Pt is alert and oriented x 3  Skin warm and dry

## 2013-04-19 NOTE — ED Notes (Signed)
Patient taken to CT dept via stretcher Patient in NAD upon leaving for testing 

## 2013-04-19 NOTE — ED Notes (Signed)
CXR at bedside

## 2013-04-19 NOTE — ED Notes (Signed)
Bed: RESB Expected date:  Expected time:  Means of arrival:  Comments: 

## 2013-04-20 ENCOUNTER — Encounter (HOSPITAL_COMMUNITY): Payer: Self-pay | Admitting: Anesthesiology

## 2013-04-20 ENCOUNTER — Inpatient Hospital Stay (HOSPITAL_COMMUNITY): Payer: Medicare HMO

## 2013-04-20 ENCOUNTER — Inpatient Hospital Stay (HOSPITAL_COMMUNITY): Payer: Medicare HMO | Admitting: Anesthesiology

## 2013-04-20 ENCOUNTER — Encounter (HOSPITAL_COMMUNITY)
Admission: EM | Disposition: A | Discharge status: Home Health | Payer: Commercial Managed Care - HMO | Source: Home / Self Care | Attending: Internal Medicine

## 2013-04-20 ENCOUNTER — Encounter (HOSPITAL_COMMUNITY): Payer: Medicare HMO | Admitting: Anesthesiology

## 2013-04-20 DIAGNOSIS — K810 Acute cholecystitis: Secondary | ICD-10-CM | POA: Diagnosis present

## 2013-04-20 DIAGNOSIS — Z87448 Personal history of other diseases of urinary system: Secondary | ICD-10-CM

## 2013-04-20 DIAGNOSIS — I428 Other cardiomyopathies: Secondary | ICD-10-CM

## 2013-04-20 DIAGNOSIS — E872 Acidosis: Secondary | ICD-10-CM

## 2013-04-20 DIAGNOSIS — I959 Hypotension, unspecified: Secondary | ICD-10-CM | POA: Diagnosis present

## 2013-04-20 DIAGNOSIS — K8050 Calculus of bile duct without cholangitis or cholecystitis without obstruction: Principal | ICD-10-CM

## 2013-04-20 DIAGNOSIS — R109 Unspecified abdominal pain: Secondary | ICD-10-CM

## 2013-04-20 DIAGNOSIS — N289 Disorder of kidney and ureter, unspecified: Secondary | ICD-10-CM | POA: Diagnosis present

## 2013-04-20 DIAGNOSIS — K8309 Other cholangitis: Secondary | ICD-10-CM

## 2013-04-20 HISTORY — PX: ERCP: SHX5425

## 2013-04-20 LAB — COMPREHENSIVE METABOLIC PANEL
Albumin: 1.7 g/dL — ABNORMAL LOW (ref 3.5–5.2)
BUN: 24 mg/dL — ABNORMAL HIGH (ref 6–23)
Chloride: 109 mEq/L (ref 96–112)
Creatinine, Ser: 1.15 mg/dL (ref 0.50–1.35)
GFR calc non Af Amer: 60 mL/min — ABNORMAL LOW (ref >90)
Total Bilirubin: 5.7 mg/dL — ABNORMAL HIGH (ref 0.3–1.2)
Total Protein: 5.5 g/dL — ABNORMAL LOW (ref 6.0–8.3)

## 2013-04-20 LAB — CBC WITH DIFFERENTIAL/PLATELET
Basophils Absolute: 0 10*3/uL (ref 0.0–0.1)
HCT: 25 % — ABNORMAL LOW (ref 39.0–52.0)
Hemoglobin: 7.9 g/dL — ABNORMAL LOW (ref 13.0–17.0)
Lymphocytes Relative: 19 % (ref 12–46)
Monocytes Absolute: 0.5 10*3/uL (ref 0.1–1.0)
Monocytes Relative: 10 % (ref 3–12)
Neutro Abs: 3.4 10*3/uL (ref 1.7–7.7)
Platelets: 127 10*3/uL — ABNORMAL LOW (ref 150–400)
RDW: 20.6 % — ABNORMAL HIGH (ref 11.5–15.5)
WBC: 4.7 10*3/uL (ref 4.0–10.5)

## 2013-04-20 LAB — MRSA PCR SCREENING: MRSA by PCR: NEGATIVE

## 2013-04-20 LAB — TROPONIN I
Troponin I: <0.3 ng/mL (ref <0.30)
Troponin I: <0.3 ng/mL (ref <0.30)

## 2013-04-20 LAB — GLUCOSE, CAPILLARY
Glucose-Capillary: 63 mg/dL — ABNORMAL LOW (ref 70–99)
Glucose-Capillary: 66 mg/dL — ABNORMAL LOW (ref 70–99)
Glucose-Capillary: 110 mg/dL — ABNORMAL HIGH (ref 70–99)

## 2013-04-20 LAB — PROTIME-INR
INR: 1.17 (ref 0.00–1.49)
Prothrombin Time: 14.7 seconds (ref 11.6–15.2)

## 2013-04-20 LAB — MAGNESIUM: Magnesium: 1.7 mg/dL (ref 1.5–2.5)

## 2013-04-20 LAB — HEMOGLOBIN A1C: Hgb A1c MFr Bld: 5.5 % (ref <5.7)

## 2013-04-20 LAB — TYPE AND SCREEN: ABO/RH(D): A POS

## 2013-04-20 LAB — PRO B NATRIURETIC PEPTIDE: Pro B Natriuretic peptide (BNP): 1499 pg/mL — ABNORMAL HIGH (ref 0–450)

## 2013-04-20 LAB — LACTIC ACID, PLASMA: Lactic Acid, Venous: 1 mmol/L (ref 0.5–2.2)

## 2013-04-20 SURGERY — ERCP, WITH INTERVENTION IF INDICATED
Anesthesia: General

## 2013-04-20 MED ORDER — PROPOFOL 10 MG/ML IV BOLUS
INTRAVENOUS | Status: DC | PRN
  Administered 2013-04-20: 100 mg via INTRAVENOUS

## 2013-04-20 MED ORDER — ETOMIDATE 2 MG/ML IV SOLN
INTRAVENOUS | Status: DC | PRN
  Administered 2013-04-20: 10 mg via INTRAVENOUS

## 2013-04-20 MED ORDER — PHENYLEPHRINE 40 MCG/ML (10ML) SYRINGE FOR IV PUSH (FOR BLOOD PRESSURE SUPPORT)
PREFILLED_SYRINGE | INTRAVENOUS | Status: AC
  Filled 2013-04-20: qty 10

## 2013-04-20 MED ORDER — METRONIDAZOLE IN NACL 5-0.79 MG/ML-% IV SOLN
500.0000 mg | Freq: Three times a day (TID) | INTRAVENOUS | Status: DC
  Administered 2013-04-20 – 2013-04-21 (×4): 500 mg via INTRAVENOUS
  Filled 2013-04-20 (×5): qty 100

## 2013-04-20 MED ORDER — POTASSIUM CHLORIDE CRYS ER 20 MEQ PO TBCR
30.0000 meq | EXTENDED_RELEASE_TABLET | Freq: Once | ORAL | Status: AC
  Administered 2013-04-20: 10:00:00 30 meq via ORAL
  Filled 2013-04-20: qty 1

## 2013-04-20 MED ORDER — SUCCINYLCHOLINE CHLORIDE 20 MG/ML IJ SOLN
INTRAMUSCULAR | Status: AC
  Filled 2013-04-20: qty 1

## 2013-04-20 MED ORDER — ONDANSETRON HCL 4 MG/2ML IJ SOLN
INTRAMUSCULAR | Status: DC | PRN
  Administered 2013-04-20: 4 mg via INTRAVENOUS

## 2013-04-20 MED ORDER — HYDROXYZINE HCL 25 MG PO TABS
25.0000 mg | ORAL_TABLET | Freq: Three times a day (TID) | ORAL | Status: DC | PRN
  Administered 2013-04-20: 25 mg via ORAL
  Filled 2013-04-20: qty 1

## 2013-04-20 MED ORDER — PHENYLEPHRINE HCL 10 MG/ML IJ SOLN
INTRAMUSCULAR | Status: DC | PRN
  Administered 2013-04-20 (×3): 80 ug via INTRAVENOUS

## 2013-04-20 MED ORDER — SODIUM CHLORIDE 0.9 % IV SOLN: INTRAVENOUS | Status: DC

## 2013-04-20 MED ORDER — ONDANSETRON HCL 4 MG/2ML IJ SOLN
INTRAMUSCULAR | Status: AC
  Filled 2013-04-20: qty 2

## 2013-04-20 MED ORDER — SUCCINYLCHOLINE CHLORIDE 20 MG/ML IJ SOLN
INTRAMUSCULAR | Status: DC | PRN
  Administered 2013-04-20: 100 mg via INTRAVENOUS

## 2013-04-20 MED ORDER — LIDOCAINE HCL (CARDIAC) 20 MG/ML IV SOLN
INTRAVENOUS | Status: DC | PRN
  Administered 2013-04-20: 100 mg via INTRAVENOUS

## 2013-04-20 MED ORDER — SODIUM CHLORIDE 0.9 % IV SOLN
INTRAVENOUS | Status: DC | PRN
  Administered 2013-04-20: 16:00:00 via INTRAVENOUS

## 2013-04-20 MED ORDER — FENTANYL CITRATE 0.05 MG/ML IJ SOLN: 25.0000 ug | INTRAMUSCULAR | Status: DC | PRN

## 2013-04-20 MED ORDER — DEXAMETHASONE SODIUM PHOSPHATE 10 MG/ML IJ SOLN
INTRAMUSCULAR | Status: DC | PRN
  Administered 2013-04-20: 10 mg via INTRAVENOUS

## 2013-04-20 MED ORDER — DEXAMETHASONE SODIUM PHOSPHATE 10 MG/ML IJ SOLN
INTRAMUSCULAR | Status: AC
  Filled 2013-04-20: qty 1

## 2013-04-20 MED ORDER — LIDOCAINE HCL (CARDIAC) 20 MG/ML IV SOLN
INTRAVENOUS | Status: AC
  Filled 2013-04-20: qty 5

## 2013-04-20 MED ORDER — FENTANYL CITRATE 0.05 MG/ML IJ SOLN
INTRAMUSCULAR | Status: AC
  Filled 2013-04-20: qty 2

## 2013-04-20 MED ORDER — SODIUM CHLORIDE 0.9 % IV SOLN
INTRAVENOUS | Status: DC | PRN
  Administered 2013-04-20: 17:00:00

## 2013-04-20 MED ORDER — PHENYLEPHRINE HCL 10 MG/ML IJ SOLN
INTRAMUSCULAR | Status: AC
  Filled 2013-04-20: qty 1

## 2013-04-20 MED ORDER — SODIUM CHLORIDE 0.9 % IV BOLUS (SEPSIS)
1000.0000 mL | Freq: Once | INTRAVENOUS | Status: AC
  Administered 2013-04-20: 1000 mL via INTRAVENOUS

## 2013-04-20 MED ORDER — ETOMIDATE 2 MG/ML IV SOLN
INTRAVENOUS | Status: AC
  Filled 2013-04-20: qty 10

## 2013-04-20 MED ORDER — SODIUM CHLORIDE 0.9 % IV SOLN
10.0000 mg | INTRAVENOUS | Status: DC | PRN
  Administered 2013-04-20: 20 ug/min via INTRAVENOUS

## 2013-04-20 MED ORDER — PROPOFOL 10 MG/ML IV BOLUS
INTRAVENOUS | Status: AC
  Filled 2013-04-20: qty 20

## 2013-04-20 MED ORDER — DEXTROSE 50 % IV SOLN
25.0000 mL | Freq: Once | INTRAVENOUS | Status: AC | PRN
  Administered 2013-04-20: 25 mL via INTRAVENOUS
  Filled 2013-04-20: qty 50

## 2013-04-20 MED ORDER — CEFTRIAXONE SODIUM 1 G IJ SOLR
1.0000 g | INTRAMUSCULAR | Status: DC
  Administered 2013-04-20 – 2013-04-21 (×2): 1 g via INTRAVENOUS
  Filled 2013-04-20 (×2): qty 10

## 2013-04-20 MED ORDER — GLUCAGON HCL (RDNA) 1 MG IJ SOLR
INTRAMUSCULAR | Status: AC
  Filled 2013-04-20: qty 1

## 2013-04-20 NOTE — Op Note (Signed)
[facility] [address] [place], [address]   ERCP PROCEDURE REPORT  PATIENT: [name], [name]  MR# :[id number] BIRTHDATE: 08-13-1933  GENDER: Male ENDOSCOPIST: Vida Rigger, MD REFERRED BY: Renford Dills, M.D. PROCEDURE DATE:  04/20/2013 PROCEDURE:   ERCP with sphincterotomy/papillotomy and ERCP with removal of calculus/calculi ASA CLASS:    3 INDICATIONS: probable CBD stone MEDICATIONS:    general anesthesia TOPICAL ANESTHETIC:  no  DESCRIPTION OF PROCEDURE:   After the risks benefits and alternatives of the procedure were thoroughly explained, informed consent was obtained.  The pentax [id number]  endoscope was introduced through the mouth and advanced to the second portion of the duodenum .the bulbous ampulla was brought into view with a slightly ulcerated ostia and 2 moderate-sized periampullary diverticuli and using the triple-lumen sphincterotome loaded with the JAG Jaguar deep selective cannulation was obtained on the first attempt and there was no pancreatic duct injections or wire advancements and dye was injected which did not reveal any obvious abnormality and we went ahead and proceeded with a moderate size sphincterotomy in the customary fashion until we had adequate biliary drainage and could get the fully bowed sphincterotome easily in and out of the duct and no signs of bleeding was seen and we exchanged the sphincter tone for the 12-15 mm adjustable balloon and proceeded with multiple balloon pull-through which did not have any resistance in pulling through the sphincterotomy site and on the initial 1 a moderate amount of sludge was delivered however the rest were normal and we proceeded with 2 occlusion cholangiograms however with increased air in the system we did not get very good pictures but would deflate the balloon and advance it above the air bubbles and then withdrawal of the balloon multiple times without any obvious stone being seen and  we elected to stop the procedure the balloon and the wire was removed and the scope was removed and the patient tolerated the procedure well there was no obvious immediate complication           COMPLICATIONS: none  ENDOSCOPIC IMPRESSION:1. Bulbous ampulla was slight ulceration of the ostia status post medium  sphincterotomy to decrease future attacks and allow any residual stones or sludge to pass readily 2. Normal CBD and intrahepatics with increased air in the system but no obvious stones 3. No PD injections or wire advancements 4 adequate biliary drainage at the end of the procedure RECOMMENDATIONS:observe for delayed complications slowly advance diet follow liver tests back to normal hopefully home soon and consideration of cholecystectomy in the future or follow him clinically for now and I will see back in the office when necessary or in a few weeks     _______________________________ eSigned:  Vida Rigger, MD 04/20/2013 5:13 PM   AO:[id number], Windy Fast MD  PATIENT NAME:  [name], [name] MR#: [id number]

## 2013-04-20 NOTE — Progress Notes (Signed)
CARE MANAGEMENT NOTE 04/20/2013  Patient:  [name], [name]   Account Number:  [id number]  Date Initiated:  04/20/2013  Documentation initiated by:  DAVIS,RHONDA  Subjective/Objective Assessment:   pt with abd pain found to have obstruction of the cbd-for ERCP, hypotensive required iv ns boluses, boarderline sepsis per the labs     Action/Plan:   home when stable is normally able to care for himself.   Anticipated DC Date:  04/23/2013   Anticipated DC Plan:  HOME/SELF CARE  In-house referral  NA      DC Planning Services  NA      [facility] Choice  NA   Choice offered to / List presented to:  NA   DME arranged  NA      DME agency  NA     HH arranged  NA      HH agency  NA   Status of service:  In process, will continue to follow Medicare Important Message given?  NA - LOS <3 / Initial given by admissions (If response is "NO", the following Medicare IM given date fields will be blank) Date Medicare IM given:   Date Additional Medicare IM given:    Discharge Disposition:    Per UR Regulation:  Reviewed for med. necessity/level of care/duration of stay  If discussed at Long Length of Stay Meetings, dates discussed:    Comments:  [id number]/Rhonda Stark Jock, BSN, [place] [phone] Chart Reviewed for discharge and hospital needs. Discharge needs at time of review:  None present will follow for needs. Review of patient progress due on [id number].

## 2013-04-20 NOTE — Progress Notes (Signed)
Patient resting prone.extremities pressure point pads in place,axicillary roll in place.head cradle in plae.

## 2013-04-20 NOTE — Anesthesia Postprocedure Evaluation (Signed)
  Anesthesia Post-op Note  Patient: [name]  Procedure(s) Performed: Procedure(s) (LRB): ENDOSCOPIC RETROGRADE CHOLANGIOPANCREATOGRAPHY (ERCP) (N/A)  Patient Location: PACU  Anesthesia Type: General  Level of Consciousness: awake and alert   Airway and Oxygen Therapy: Patient Spontanous Breathing  Post-op Pain: mild  Post-op Assessment: Post-op Vital signs reviewed, Patient's Cardiovascular Status Stable, Respiratory Function Stable, Patent Airway and No signs of Nausea or vomiting  Last Vitals:  Filed Vitals:   04/20/13 1900  BP: 123/39  Pulse: 86  Temp:   Resp: 17    Post-op Vital Signs: stable   Complications: No apparent anesthesia complications

## 2013-04-20 NOTE — Progress Notes (Signed)
Subjective: Patient alert and oriented, no apparent distress. No complaint of emesis no abdominal pain. Admission H&P reviewed. Outpatient lab showed biliary obstruction confirmed by ultrasound, which recommended MRCP. Outpatient MRCP and GI consult were ordered yesterday however patient with complaints of nausea vomiting abdominal pain poor by mouth intake therefore presented to the ED. There've been no complications overnight.  Objective: Vital signs in last 24 hours: Temp:  [97.4 F (36.3 C)-99.2 F (37.3 C)] 97.4 F (36.3 C) (12/18 0800) Pulse Rate:  [78-140] 81 (12/18 0700) Resp:  [14-37] 17 (12/18 0700) BP: (79-117)/(34-72) 117/39 mmHg (12/18 0700) SpO2:  [90 %-100 %] 100 % (12/18 0700) Weight:  [57 kg (125 lb 10.6 oz)] 57 kg (125 lb 10.6 oz) (12/17 2323) Weight change:     Intake/Output from previous day: 12/17 0701 - 12/18 0700 In: 2100 [I.V.:950; IV Piggyback:1150] Out: -  Intake/Output this shift:    General appearance: alert and cooperative Eyes: slightly icteric sclera Resp: decreased breath sounds in bases Cardio: regular rate and rhythm GI: soft, non-tender; bowel sounds normal; no masses,  no organomegaly Extremities: extremities normal, atraumatic, no cyanosis or edema  Lab Results:  Results for orders placed during the hospital encounter of 04/19/13 (from the past 24 hour(s))  CBC WITH DIFFERENTIAL     Status: Abnormal   Collection Time    04/19/13  8:00 PM      Result Value Range   WBC 9.6  4.0 - 10.5 K/uL   RBC 4.06 (*) 4.22 - 5.81 MIL/uL   Hemoglobin 10.9 (*) 13.0 - 17.0 g/dL   HCT 60.4 (*) 54.0 - 98.1 %   MCV 80.8  78.0 - 100.0 fL   MCH 26.8  26.0 - 34.0 pg   MCHC 33.2  30.0 - 36.0 g/dL   RDW 19.1 (*) 47.8 - 29.5 %   Platelets 221  150 - 400 K/uL   Neutrophils Relative % 88 (*) 43 - 77 %   Neutro Abs 8.5 (*) 1.7 - 7.7 K/uL   Lymphocytes Relative 6 (*) 12 - 46 %   Lymphs Abs 0.5 (*) 0.7 - 4.0 K/uL   Monocytes Relative 6  3 - 12 %   Monocytes  Absolute 0.6  0.1 - 1.0 K/uL   Eosinophils Relative 0  0 - 5 %   Eosinophils Absolute 0.0  0.0 - 0.7 K/uL   Basophils Relative 0  0 - 1 %   Basophils Absolute 0.0  0.0 - 0.1 K/uL  BASIC METABOLIC PANEL     Status: Abnormal   Collection Time    04/19/13  8:00 PM      Result Value Range   Sodium 141  135 - 145 mEq/L   Potassium 3.8  3.5 - 5.1 mEq/L   Chloride 106  96 - 112 mEq/L   CO2 20  19 - 32 mEq/L   Glucose, Bld 123 (*) 70 - 99 mg/dL   BUN 27 (*) 6 - 23 mg/dL   Creatinine, Ser 6.21 (*) 0.50 - 1.35 mg/dL   Calcium 9.6  8.4 - 30.8 mg/dL   GFR calc non Af Amer 45 (*) >90 mL/min   GFR calc Af Amer 52 (*) >90 mL/min  HEPATIC FUNCTION PANEL     Status: Abnormal   Collection Time    04/19/13  8:00 PM      Result Value Range   Total Protein 8.0  6.0 - 8.3 g/dL   Albumin 2.7 (*) 3.5 - 5.2 g/dL  AST 97 (*) 0 - 37 U/L   ALT 109 (*) 0 - 53 U/L   Alkaline Phosphatase 505 (*) 39 - 117 U/L   Total Bilirubin 7.6 (*) 0.3 - 1.2 mg/dL   Bilirubin, Direct 5.9 (*) 0.0 - 0.3 mg/dL   Indirect Bilirubin 1.7 (*) 0.3 - 0.9 mg/dL  LIPASE, BLOOD     Status: Abnormal   Collection Time    04/19/13  8:00 PM      Result Value Range   Lipase >3000 (*) 11 - 59 U/L  CG4 I-STAT (LACTIC ACID)     Status: Abnormal   Collection Time    04/19/13  8:33 PM      Result Value Range   Lactic Acid, Venous 4.05 (*) 0.5 - 2.2 mmol/L  MRSA PCR SCREENING     Status: None   Collection Time    04/19/13 11:16 PM      Result Value Range   MRSA by PCR NEGATIVE  NEGATIVE  COMPREHENSIVE METABOLIC PANEL     Status: Abnormal   Collection Time    04/20/13  5:45 AM      Result Value Range   Sodium 138  135 - 145 mEq/L   Potassium 3.3 (*) 3.5 - 5.1 mEq/L   Chloride 109  96 - 112 mEq/L   CO2 19  19 - 32 mEq/L   Glucose, Bld 96  70 - 99 mg/dL   BUN 24 (*) 6 - 23 mg/dL   Creatinine, Ser 1.61  0.50 - 1.35 mg/dL   Calcium 7.6 (*) 8.4 - 10.5 mg/dL   Total Protein 5.5 (*) 6.0 - 8.3 g/dL   Albumin 1.7 (*) 3.5 - 5.2 g/dL    AST 55 (*) 0 - 37 U/L   ALT 65 (*) 0 - 53 U/L   Alkaline Phosphatase 322 (*) 39 - 117 U/L   Total Bilirubin 5.7 (*) 0.3 - 1.2 mg/dL   GFR calc non Af Amer 60 (*) >90 mL/min   GFR calc Af Amer 69 (*) >90 mL/min  MAGNESIUM     Status: None   Collection Time    04/20/13  5:45 AM      Result Value Range   Magnesium 1.7  1.5 - 2.5 mg/dL  CBC WITH DIFFERENTIAL     Status: Abnormal   Collection Time    04/20/13  5:45 AM      Result Value Range   WBC 4.7  4.0 - 10.5 K/uL   RBC 3.04 (*) 4.22 - 5.81 MIL/uL   Hemoglobin 7.9 (*) 13.0 - 17.0 g/dL   HCT 09.6 (*) 04.5 - 40.9 %   MCV 82.2  78.0 - 100.0 fL   MCH 26.0  26.0 - 34.0 pg   MCHC 31.6  30.0 - 36.0 g/dL   RDW 81.1 (*) 91.4 - 78.2 %   Platelets 127 (*) 150 - 400 K/uL   Neutrophils Relative % 71  43 - 77 %   Neutro Abs 3.4  1.7 - 7.7 K/uL   Lymphocytes Relative 19  12 - 46 %   Lymphs Abs 0.9  0.7 - 4.0 K/uL   Monocytes Relative 10  3 - 12 %   Monocytes Absolute 0.5  0.1 - 1.0 K/uL   Eosinophils Relative 0  0 - 5 %   Eosinophils Absolute 0.0  0.0 - 0.7 K/uL   Basophils Relative 0  0 - 1 %   Basophils Absolute 0.0  0.0 - 0.1 K/uL  PRO B NATRIURETIC PEPTIDE     Status: Abnormal   Collection Time    04/20/13  5:45 AM      Result Value Range   Pro B Natriuretic peptide (BNP) 1499.0 (*) 0 - 450 pg/mL  TROPONIN I     Status: None   Collection Time    04/20/13  5:45 AM      Result Value Range   Troponin I <0.30  <0.30 ng/mL  LACTIC ACID, PLASMA     Status: None   Collection Time    04/20/13  5:45 AM      Result Value Range   Lactic Acid, Venous 1.0  0.5 - 2.2 mmol/L      Studies/Results: Ct Abdomen Pelvis Wo Contrast  04/19/2013   CLINICAL DATA:  Bilateral abdominal pain, worse on the right. Vomiting.  EXAM: CT ABDOMEN AND PELVIS WITHOUT CONTRAST  TECHNIQUE: Multidetector CT imaging of the abdomen and pelvis was performed following the standard protocol without intravenous contrast.  COMPARISON:  07/06/2011  FINDINGS: BODY WALL:  Status post ventral abdominal hernia repair with mesh.  LOWER CHEST: Streaky lower lung opacities is likely atelectasis.  ABDOMEN/PELVIS:  Liver:  Stable multiple cysts as noted on previous infused imaging.  Biliary: Newly dilated common bile duct, 14 mm in diameter. The duct is dilated to the ampulla. No radiodense choledocholithiasis. No evidence of mass. No evidence of pancreatic ductal dilatation.  Pancreas: Unremarkable.  Spleen: Unremarkable.  Adrenals: Unremarkable.  Kidneys and ureters: No hydronephrosis. Punctate stone in the interpolar left kidney.  Bladder: Unremarkable.  Reproductive: Unremarkable.  Bowel: No obstruction. No pericecal inflammation. Colonic diverticulosis.  Retroperitoneum: No mass or adenopathy.  Peritoneum: No free fluid or gas.  Vascular: Status post bifem bypass. IVC filter present. Right aortoiliac stenting.  OSSEOUS: Distal portions of right femoral neck fixation hardware shows no acute abnormality. Degenerative disc disease with dextroscoliosis.  IMPRESSION: 1. New biliary dilatation to level of the papilla. No visible cause by CT, reference previous sonography. 2. Chronic findings stable from prior and noted above.   Electronically Signed   By: Tiburcio Pea M.D.   On: 04/19/2013 23:15   Dg Chest 2 View  04/18/2013   CLINICAL DATA:  Cough, congestion  EXAM: CHEST  2 VIEW  COMPARISON:  None.  FINDINGS: There is chronic elevation of the right hemidiaphragm. No focal regions of consolidation nor focal infiltrates are appreciated. The cardiac silhouette is within normal limits. There is dextroscoliosis of the thoracolumbar spine.  IMPRESSION: Chronic elevation right hemidiaphragm by evidence of acute cardiopulmonary disease.   Electronically Signed   By: Salome Holmes M.D.   On: 04/18/2013 11:33   US Abdomen Complete  04/19/2013   CLINICAL DATA:  Nausea  EXAM: ULTRASOUND ABDOMEN COMPLETE  COMPARISON:  None.  FINDINGS: Gallbladder:  No gallstones or wall thickening  visualized. There is gallbladder sludge present. No sonographic Murphy sign noted.  Common bile duct:  Diameter: 16.1 mm  Liver:  There is mild intrahepatic biliary ductal dilatation. There is a 2.8 x 1.9 x 2.5 cm lobulated anechoic left hepatic mass most consistent with a cyst which was visualized on prior CT abdomen dated 07/06/2011.  IVC:  No abnormality visualized.  Pancreas:  Visualized portion unremarkable.  Spleen:  Size and appearance within normal limits.  Right Kidney:  Length: 9.9 cm. Echogenicity is increased. No mass or hydronephrosis visualized.  Left Kidney:  Length: 9.9 cm. Echogenicity is increased. No mass or hydronephrosis visualized.  Abdominal aorta:  No  aneurysm visualized.  Other findings:  None.  IMPRESSION: 1. No cholelithiasis. Dilated common bile duct measuring 16.1 mm with intrahepatic biliary ductal dilatation. There is no obstructing mass identified. There is echogenic material within the distal common bile duct which may be artifact versus choledocholithiasis. Recommend further evaluation with MRCP.   Electronically Signed   By: Elige Ko   On: 04/19/2013 10:36   Dg Chest Port 1 View  04/19/2013   CLINICAL DATA:  Abdominal pain, weakness, rule out free abdominal air  EXAM: PORTABLE CHEST - 1 VIEW  COMPARISON:  04/18/2013  FINDINGS: Cardiomediastinal silhouette is stable. There is small left pleural effusion with blunting of the left costophrenic angle. No free abdominal air is noted in upper abdomen. No pulmonary edema.  IMPRESSION: Small left pleural effusion with left basilar atelectasis. No subdiaphragmatic free air.   Electronically Signed   By: Natasha Mead M.D.   On: 04/19/2013 20:42    Medications:  Prior to Admission:  Prescriptions prior to admission  Medication Sig Dispense Refill  . aspirin EC 81 MG tablet Take 81 mg by mouth every evening.       . citalopram (CELEXA) 20 MG tablet Take 20 mg by mouth every morning.       . clopidogrel (PLAVIX) 75 MG tablet Take  75 mg by mouth every morning.       . diltiazem (DILACOR XR) 180 MG 24 hr capsule Take 180 mg by mouth every morning.       . isosorbide mononitrate (IMDUR) 30 MG 24 hr tablet Take 30 mg by mouth every morning.       . lactose free nutrition (BOOST) LIQD Take 237 mLs by mouth 3 (three) times daily.      . metoprolol (LOPRESSOR) 100 MG tablet Take 100 mg by mouth 2 (two) times daily.      . Multiple Vitamin (MULTIVITAMIN WITH MINERALS) TABS tablet Take 1 tablet by mouth every morning.       . nitroGLYCERIN (NITROSTAT) 0.4 MG SL tablet Place 0.4 mg under the tongue every 5 (five) minutes as needed for chest pain.      . pantoprazole (PROTONIX) 40 MG tablet Take 40 mg by mouth every morning.       . polyethylene glycol (MIRALAX / GLYCOLAX) packet Take 17 g by mouth daily as needed for moderate constipation.      . simvastatin (ZOCOR) 10 MG tablet Take 10 mg by mouth every evening.       . Tamsulosin HCl (FLOMAX) 0.4 MG CAPS Take 0.4 mg by mouth every morning.       . VENTOLIN HFA 108 (90 BASE) MCG/ACT inhaler Inhale 2 puffs into the lungs every 4 (four) hours as needed for wheezing or shortness of breath.       . vitamin C (ASCORBIC ACID) 500 MG tablet Take 500 mg by mouth every morning.       . zinc sulfate 220 MG capsule Take 220 mg by mouth every morning.       Scheduled: . sodium chloride   Intravenous STAT  . aspirin EC  81 mg Oral Daily  . cefTRIAXone (ROCEPHIN)  IV  1 g Intravenous Q24H  . citalopram  20 mg Oral q morning - 10a  . clopidogrel  75 mg Oral q morning - 10a  . docusate sodium  100 mg Oral BID  . enoxaparin (LOVENOX) injection  40 mg Subcutaneous Q24H  . metronidazole  500 mg Intravenous Q8H  .  pantoprazole  40 mg Oral q morning - 10a  . senna  1 tablet Oral BID  . sodium chloride  1,000 mL Intravenous Once  . tamsulosin  0.4 mg Oral q morning - 10a   Continuous: . sodium chloride 100 mL/hr at 04/20/13 0000    Assessment/Plan: Pleasant 79 year old male presented to  the hospital with nausea vomiting labs and ultrasound consistent with biliary obstruction. Patient currently states he is without nausea vomiting or abdominal pain. MRCP was recommended, it appears plans are for ERCP this morning, we'll discuss with GI. Chronic kidney disease stage III Anemia probably secondary to chronic disease, patient did have a drop in hemoglobin after IV fluids. There've been no reports of black stools or red blood Coronary artery disease, LV dysfunction. BNP elevated after fluid challenge. IV fluids will be saline locked History of DVT PE status post [place] filter Peripheral vascular disease History of asthma  History bladder outlet obstruction History of CVA with left hemiparesis, dysphagia  LOS: 1 day   Eylin Pontarelli D 04/20/2013, 8:24 AM

## 2013-04-20 NOTE — Preoperative (Signed)
Beta Blockers   Reason not to administer Beta Blockers:Not Applicable 

## 2013-04-20 NOTE — Anesthesia Preprocedure Evaluation (Addendum)
Anesthesia Evaluation  Patient identified by MRN, date of birth, ID band Patient awake    Reviewed: Allergy & Precautions, H&P , NPO status , Patient's Chart, lab work & pertinent test results  Airway Mallampati: II TM Distance: >3 FB Neck ROM: Full    Dental no notable dental hx.    Pulmonary asthma , pneumonia -, former smoker,  H/O DVT, PE, IVC filter.  H/O acute respiratory failure. breath sounds clear to auscultation  Pulmonary exam normal       Cardiovascular hypertension, Pt. on medications + CAD, + Past MI and + Peripheral Vascular Disease Rhythm:Regular Rate:Normal  EF 40-50% 09-27-11   Neuro/Psych Dysphagia. CVA, Residual Symptoms negative psych ROS   GI/Hepatic negative GI ROS, Neg liver ROS,   Endo/Other  negative endocrine ROS  Renal/GU Renal InsufficiencyRenal disease  negative genitourinary   Musculoskeletal negative musculoskeletal ROS (+)   Abdominal   Peds negative pediatric ROS (+)  Hematology  (+) anemia ,   Anesthesia Other Findings   Reproductive/Obstetrics negative OB ROS                          Anesthesia Physical Anesthesia Plan  ASA: IV  Anesthesia Plan: General   Post-op Pain Management:    Induction: Intravenous  Airway Management Planned:   Additional Equipment:   Intra-op Plan:   Post-operative Plan: Extubation in OR  Informed Consent: I have reviewed the patients History and Physical, chart, labs and discussed the procedure including the risks, benefits and alternatives for the proposed anesthesia with the patient or authorized representative who has indicated his/her understanding and acceptance.   Dental advisory given  Plan Discussed with: CRNA  Anesthesia Plan Comments:        Anesthesia Quick Evaluation

## 2013-04-20 NOTE — Transfer of Care (Signed)
Immediate Anesthesia Transfer of Care Note  Patient: [name]  Procedure(s) Performed: Procedure(s): ENDOSCOPIC RETROGRADE CHOLANGIOPANCREATOGRAPHY (ERCP) (N/A)  Patient Location: PACU  Anesthesia Type:General  Level of Consciousness: sedated  Airway & Oxygen Therapy: Patient Spontanous Breathing and Patient connected to face mask oxygen  Post-op Assessment: Report given to PACU RN and Post -op Vital signs reviewed and stable  Post vital signs: Reviewed and stable  Complications: No apparent anesthesia complications

## 2013-04-20 NOTE — Progress Notes (Signed)
[name] 10:58 AM  Subjective: Patient well-known to me from previous office visit and hospital stays with multiple other GI issues and his case was discussed with Dr. polite and we talked to he and his wife yesterday trying to arrange outpatient workup and he is currently doing much better without pain or nausea or vomiting and he did have a bowel movement earlier today and has no new complaints  Objective: Vital signs stable afebrile no acute distress lungs are clear heart regular rate and rhythm abdomen is soft nontender good bowel sounds slight distention which is normal for him normal decreased hemoglobin and decreased BUN and creatinine without signs of bleeding and decreased liver tests and ultrasound and CT reviewed  Assessment: Multiple medical problems in a patient with probable passed CBD stones  Plan: Awaiting recheck lipase but no signs of pancreatitis clinically or on CT and we had a long talk about the risks benefits and methods of ERCP and sphincterotomy which would be done not only to rule out CBD stone but hopefully decrease a future attack and we also discussed possible stenting as well as the increased risks doing this while he is on blood thinners versus the risks of stopping his blood thinners and we might proceed later today or tomorrow depending on recheck lipase and schedule availability. We also discussed MRCP both yesterday on the phone and today versus possible [facility]

## 2013-04-20 NOTE — Progress Notes (Addendum)
[facility]  Patient Status: Active (receiving services up to time of hospitalization)  [facility] is providing the following services: PT.  Pt was not seen prior to be admitted to the hospital. Referral for [facility] PT was sent from Dr. Patsi Sears at [place] on 12/17.  If patient discharges after hours, please call [phone].   [name] 04/20/2013, 11:40 AM

## 2013-04-21 ENCOUNTER — Encounter (HOSPITAL_COMMUNITY): Payer: Self-pay | Admitting: Gastroenterology

## 2013-04-21 DIAGNOSIS — E43 Unspecified severe protein-calorie malnutrition: Secondary | ICD-10-CM | POA: Insufficient documentation

## 2013-04-21 LAB — CBC WITH DIFFERENTIAL/PLATELET
Basophils Absolute: 0 K/uL (ref 0.0–0.1)
Basophils Relative: 0 % (ref 0–1)
Eosinophils Absolute: 0 K/uL (ref 0.0–0.7)
Eosinophils Relative: 0 % (ref 0–5)
HCT: 28.6 % — ABNORMAL LOW (ref 39.0–52.0)
Hemoglobin: 9.2 g/dL — ABNORMAL LOW (ref 13.0–17.0)
Lymphocytes Relative: 11 % — ABNORMAL LOW (ref 12–46)
Lymphs Abs: 0.4 K/uL — ABNORMAL LOW (ref 0.7–4.0)
MCH: 26.4 pg (ref 26.0–34.0)
MCHC: 32.2 g/dL (ref 30.0–36.0)
MCV: 82.2 fL (ref 78.0–100.0)
Monocytes Absolute: 0.1 K/uL (ref 0.1–1.0)
Monocytes Relative: 1 % — ABNORMAL LOW (ref 3–12)
Neutro Abs: 3.1 K/uL (ref 1.7–7.7)
Neutrophils Relative %: 88 % — ABNORMAL HIGH (ref 43–77)
Platelets: 164 K/uL (ref 150–400)
RBC: 3.48 MIL/uL — ABNORMAL LOW (ref 4.22–5.81)
RDW: 20.6 % — ABNORMAL HIGH (ref 11.5–15.5)
WBC: 3.5 K/uL — ABNORMAL LOW (ref 4.0–10.5)

## 2013-04-21 LAB — COMPREHENSIVE METABOLIC PANEL
ALT: 60 U/L — ABNORMAL HIGH (ref 0–53)
Alkaline Phosphatase: 347 U/L — ABNORMAL HIGH (ref 39–117)
CO2: 17 mEq/L — ABNORMAL LOW (ref 19–32)
GFR calc Af Amer: 90 mL/min — ABNORMAL LOW (ref >90)
GFR calc non Af Amer: 77 mL/min — ABNORMAL LOW (ref >90)
Glucose, Bld: 126 mg/dL — ABNORMAL HIGH (ref 70–99)
Potassium: 4.2 mEq/L (ref 3.5–5.1)
Sodium: 138 mEq/L (ref 135–145)

## 2013-04-21 LAB — LACTIC ACID, PLASMA: Lactic Acid, Venous: 0.7 mmol/L (ref 0.5–2.2)

## 2013-04-21 MED ORDER — ENOXAPARIN SODIUM 30 MG/0.3ML ~~LOC~~ SOLN
30.0000 mg | SUBCUTANEOUS | Status: DC
  Administered 2013-04-21: 30 mg via SUBCUTANEOUS
  Filled 2013-04-21 (×2): qty 0.3

## 2013-04-21 MED ORDER — ENOXAPARIN SODIUM 40 MG/0.4ML ~~LOC~~ SOLN
40.0000 mg | SUBCUTANEOUS | Status: DC
  Filled 2013-04-21: qty 0.4

## 2013-04-21 MED ORDER — BOOST PLUS PO LIQD
237.0000 mL | Freq: Three times a day (TID) | ORAL | Status: DC
  Administered 2013-04-21 – 2013-04-23 (×5): 237 mL via ORAL
  Filled 2013-04-21 (×9): qty 237

## 2013-04-21 NOTE — Progress Notes (Signed)
[name] 11:48 AM  Subjective: Patient doing well without any new complaints and no obvious problem from ERCP and we rediscussed the results and is tolerating liquids  Objective: Vital signs stable afebrile no acute distress abdomen soft nontender liver tests only slightly decreased Assessment: Multiple medical problems beleave past CBD stone  Plan: Advance diet hopefully he'll be able to go home soon and I will see him back in January and repeat liver tests and make sure no further workup and plans  [employer] E

## 2013-04-21 NOTE — Progress Notes (Signed)
Patient ID: [name], male   DOB: [birth year], 79 y.o.   MRN: [id number] Patient felt to be medically stable for transfer to a floor bed. See previously dictated note for further details

## 2013-04-21 NOTE — Evaluation (Signed)
Physical Therapy Evaluation Patient Details Name: [name] MRN: [id number] DOB: [birth year] Today's Date: 04/21/2013 Time: 3086-5784 PT Time Calculation (min): 28 min  PT Assessment / Plan / Recommendation History of Present Illness  79 y.o. BM PMHx  right intertrochanteric hip fracture February 2013, PE July 2012, S/P IVC placement 10/03/2011, PVD thrombosed R-L BPG, S/P lobectomy 10/05/2011, DVT 3/13, CVA February 2013 (residual Dysphasia), cardiomyopathy EF 40-50%, SBO lysis of adhesions February 2013, Hx acute respiratory failure with hypoxia, renal insufficiency,Hx NSTEMI. Patient sees [facility] gastroenterology. Per Dr. Oletha Cruel GI contacted concerning patient:, last seen by Dr. Willis Modena 09/27/2011 GI bleed.    Clinical Impression  Pt presented to the hospital with nausea vomiting labs and ultrasound consistent with biliary obstruction. He is now status post ERCP with sphincterotomy.  Pt currently with functional limitations due to the deficits listed below (see PT Problem List).  Pt will benefit from skilled PT to increase their independence and safety with mobility to allow discharge to the venue listed below.  Pt reports he recently started HHPT.  Pt feels almost back to his baseline however presents with L sided deficits from previous CVA and hopes PT can assist him especially with L LE during ambulation.  Pt also reports hx of multiple falls with at least 3 within this last month.       PT Assessment  Patient needs continued PT services    Follow Up Recommendations  Home health PT;Supervision for mobility/OOB    Does the patient have the potential to tolerate intense rehabilitation      Barriers to Discharge        Equipment Recommendations  None recommended by PT    Recommendations for Other Services     Frequency Min 3X/week    Precautions / Restrictions Precautions Precautions: Fall Restrictions Other Position/Activity Restrictions: states he has old fx of L  arm (?May) and Dr told him to put NWB however states MD told him last visit it was 90% healed and he has been using RW at home   Pertinent Vitals/Pain Increased mobility and repositioned in recliner      Mobility  Bed Mobility Bed Mobility: Left Sidelying to Sit;Rolling Left Rolling Left: 4: Min assist;With rail Left Sidelying to Sit: 4: Min assist;With rails Details for Bed Mobility Assistance: assist for LEs to EOB and trunk upright Transfers Transfers: Sit to Stand;Stand to Sit Sit to Stand: 4: Min assist;4: Min guard;With upper extremity assist;From chair/3-in-1;From bed Stand to Sit: 4: Min guard;With upper extremity assist;To chair/3-in-1 Details for Transfer Assistance: assist to rise initially, verbal cues for hand placement Ambulation/Gait Ambulation/Gait Assistance: 4: Min guard Ambulation Distance (Feet): 30 Feet (total) Assistive device: Rolling walker Ambulation/Gait Assistance Details: pt ambulated into hallway then stated he needed to use bathroom so assisted to his bathroom then to recliner due to fatigue from BM Gait Pattern: Decreased dorsiflexion - left;Decreased hip/knee flexion - left;Step-through pattern;Trunk flexed;Decreased stride length Gait velocity: decreased    Exercises     PT Diagnosis: Difficulty walking  PT Problem List: Decreased strength;Decreased activity tolerance;Decreased mobility;Decreased balance PT Treatment Interventions: DME instruction;Gait training;Functional mobility training;Therapeutic activities;Therapeutic exercise;Balance training;Neuromuscular re-education;Patient/family education     PT Goals(Current goals can be found in the care plan section) Acute Rehab PT Goals PT Goal Formulation: With patient Time For Goal Achievement: 04/28/13 Potential to Achieve Goals: Good  Visit Information  Last PT Received On: 04/21/13 Assistance Needed: +1 History of Present Illness: 79 y.o. BM PMHx  right intertrochanteric hip fracture  February 2013, PE July 2012, S/P IVC placement 10/03/2011, PVD thrombosed R-L BPG, S/P lobectomy 10/05/2011, DVT 3/13, CVA February 2013 (residual Dysphasia), cardiomyopathy EF 40-50%, SBO lysis of adhesions February 2013, Hx acute respiratory failure with hypoxia, renal insufficiency,Hx NSTEMI. Patient sees [facility] gastroenterology. Per Dr. Oletha Cruel GI contacted concerning patient:, last seen by Dr. Willis Modena 09/27/2011 GI bleed.         Prior Functioning  Home Living Family/patient expects to be discharged to:: Private residence Living Arrangements: Spouse/significant other Type of Home: House Home Access: Level entry Home Layout: One level Home Equipment: IT sales professional - 2 wheels Prior Function Level of Independence: Independent with assistive device(s) Communication Communication: No difficulties    Cognition  Cognition Arousal/Alertness: Awake/alert Behavior During Therapy: WFL for tasks assessed/performed Overall Cognitive Status: Within Functional Limits for tasks assessed    Extremity/Trunk Assessment Upper Extremity Assessment Upper Extremity Assessment: LUE deficits/detail LUE Deficits / Details: residual L sided deficits from previous CVA Lower Extremity Assessment Lower Extremity Assessment: LLE deficits/detail LLE Deficits / Details: residual L sided deficits from previous CVA   Balance Balance Balance Assessed: No (however states hx of falls (3 in the last month))  End of Session PT - End of Session Activity Tolerance: Patient limited by fatigue Patient left: in chair;with call bell/phone within reach  GP     Juma Oxley,[name] 04/21/2013, 3:14 PM Zenovia Jarred, PT, DPT 04/21/2013 Pager: [phone]

## 2013-04-21 NOTE — Progress Notes (Signed)
Subjective: Patient doing well, no complications overnight. No fever chills no abdominal pain. Tolerated clear liquid diet. GI input appreciated  Objective: Vital signs in last 24 hours: Temp:  [97 F (36.1 C)-98.2 F (36.8 C)] 97.7 F (36.5 C) (12/19 0400) Pulse Rate:  [72-94] 81 (12/19 0500) Resp:  [15-21] 16 (12/19 0500) BP: (98-126)/(38-71) 126/54 mmHg (12/19 0500) SpO2:  [96 %-100 %] 100 % (12/19 0500) Weight change:  Last BM Date: 04/20/13  Intake/Output from previous day: 12/18 0701 - 12/19 0700 In: 1903.5 [P.O.:720; I.V.:833.5; IV Piggyback:350] Out: 1625 [Urine:1625] Intake/Output this shift:    General appearance: alert and cooperative Resp: clear to auscultation bilaterally Cardio: regular rate and rhythm, S1, S2 normal, no murmur, click, rub or gallop GI: soft, non-tender; bowel sounds normal; no masses,  no organomegaly Extremities: extremities normal, atraumatic, no cyanosis or edema  Lab Results:  Results for orders placed during the hospital encounter of 04/19/13 (from the past 24 hour(s))  TROPONIN I     Status: None   Collection Time    04/20/13 11:35 AM      Result Value Range   Troponin I <0.30  <0.30 ng/mL  PROTIME-INR     Status: None   Collection Time    04/20/13 11:35 AM      Result Value Range   Prothrombin Time 14.7  11.6 - 15.2 seconds   INR 1.17  0.00 - 1.49  TYPE AND SCREEN     Status: None   Collection Time    04/20/13 11:35 AM      Result Value Range   ABO/RH(D) A POS     Antibody Screen NEG     Sample Expiration 04/23/2013    GLUCOSE, CAPILLARY     Status: Abnormal   Collection Time    04/20/13  3:06 PM      Result Value Range   Glucose-Capillary 66 (*) 70 - 99 mg/dL  GLUCOSE, CAPILLARY     Status: Abnormal   Collection Time    04/20/13  3:08 PM      Result Value Range   Glucose-Capillary 63 (*) 70 - 99 mg/dL  GLUCOSE, CAPILLARY     Status: Abnormal   Collection Time    04/20/13  3:41 PM      Result Value Range    Glucose-Capillary 110 (*) 70 - 99 mg/dL  COMPREHENSIVE METABOLIC PANEL     Status: Abnormal   Collection Time    04/21/13  4:17 AM      Result Value Range   Sodium 138  135 - 145 mEq/L   Potassium 4.2  3.5 - 5.1 mEq/L   Chloride 111  96 - 112 mEq/L   CO2 17 (*) 19 - 32 mEq/L   Glucose, Bld 126 (*) 70 - 99 mg/dL   BUN 23  6 - 23 mg/dL   Creatinine, Ser 1.61  0.50 - 1.35 mg/dL   Calcium 8.6  8.4 - 09.6 mg/dL   Total Protein 6.0  6.0 - 8.3 g/dL   Albumin 1.9 (*) 3.5 - 5.2 g/dL   AST 50 (*) 0 - 37 U/L   ALT 60 (*) 0 - 53 U/L   Alkaline Phosphatase 347 (*) 39 - 117 U/L   Total Bilirubin 5.5 (*) 0.3 - 1.2 mg/dL   GFR calc non Af Amer 77 (*) >90 mL/min   GFR calc Af Amer 90 (*) >90 mL/min  MAGNESIUM     Status: None   Collection Time  04/21/13  4:17 AM      Result Value Range   Magnesium 1.9  1.5 - 2.5 mg/dL  CBC WITH DIFFERENTIAL     Status: Abnormal   Collection Time    04/21/13  4:17 AM      Result Value Range   WBC 3.5 (*) 4.0 - 10.5 K/uL   RBC 3.48 (*) 4.22 - 5.81 MIL/uL   Hemoglobin 9.2 (*) 13.0 - 17.0 g/dL   HCT 16.1 (*) 09.6 - 04.5 %   MCV 82.2  78.0 - 100.0 fL   MCH 26.4  26.0 - 34.0 pg   MCHC 32.2  30.0 - 36.0 g/dL   RDW 40.9 (*) 81.1 - 91.4 %   Platelets 164  150 - 400 K/uL   Neutrophils Relative % 88 (*) 43 - 77 %   Neutro Abs 3.1  1.7 - 7.7 K/uL   Lymphocytes Relative 11 (*) 12 - 46 %   Lymphs Abs 0.4 (*) 0.7 - 4.0 K/uL   Monocytes Relative 1 (*) 3 - 12 %   Monocytes Absolute 0.1  0.1 - 1.0 K/uL   Eosinophils Relative 0  0 - 5 %   Eosinophils Absolute 0.0  0.0 - 0.7 K/uL   Basophils Relative 0  0 - 1 %   Basophils Absolute 0.0  0.0 - 0.1 K/uL  LACTIC ACID, PLASMA     Status: None   Collection Time    04/21/13  4:17 AM      Result Value Range   Lactic Acid, Venous 0.7  0.5 - 2.2 mmol/L      Studies/Results: Ct Abdomen Pelvis Wo Contrast  04/19/2013   CLINICAL DATA:  Bilateral abdominal pain, worse on the right. Vomiting.  EXAM: CT ABDOMEN AND PELVIS  WITHOUT CONTRAST  TECHNIQUE: Multidetector CT imaging of the abdomen and pelvis was performed following the standard protocol without intravenous contrast.  COMPARISON:  07/06/2011  FINDINGS: BODY WALL: Status post ventral abdominal hernia repair with mesh.  LOWER CHEST: Streaky lower lung opacities is likely atelectasis.  ABDOMEN/PELVIS:  Liver:  Stable multiple cysts as noted on previous infused imaging.  Biliary: Newly dilated common bile duct, 14 mm in diameter. The duct is dilated to the ampulla. No radiodense choledocholithiasis. No evidence of mass. No evidence of pancreatic ductal dilatation.  Pancreas: Unremarkable.  Spleen: Unremarkable.  Adrenals: Unremarkable.  Kidneys and ureters: No hydronephrosis. Punctate stone in the interpolar left kidney.  Bladder: Unremarkable.  Reproductive: Unremarkable.  Bowel: No obstruction. No pericecal inflammation. Colonic diverticulosis.  Retroperitoneum: No mass or adenopathy.  Peritoneum: No free fluid or gas.  Vascular: Status post bifem bypass. IVC filter present. Right aortoiliac stenting.  OSSEOUS: Distal portions of right femoral neck fixation hardware shows no acute abnormality. Degenerative disc disease with dextroscoliosis.  IMPRESSION: 1. New biliary dilatation to level of the papilla. No visible cause by CT, reference previous sonography. 2. Chronic findings stable from prior and noted above.   Electronically Signed   By: Tiburcio Pea M.D.   On: 04/19/2013 23:15   US Abdomen Complete  04/19/2013   CLINICAL DATA:  Nausea  EXAM: ULTRASOUND ABDOMEN COMPLETE  COMPARISON:  None.  FINDINGS: Gallbladder:  No gallstones or wall thickening visualized. There is gallbladder sludge present. No sonographic Murphy sign noted.  Common bile duct:  Diameter: 16.1 mm  Liver:  There is mild intrahepatic biliary ductal dilatation. There is a 2.8 x 1.9 x 2.5 cm lobulated anechoic left hepatic mass most consistent with a  cyst which was visualized on prior CT abdomen dated  07/06/2011.  IVC:  No abnormality visualized.  Pancreas:  Visualized portion unremarkable.  Spleen:  Size and appearance within normal limits.  Right Kidney:  Length: 9.9 cm. Echogenicity is increased. No mass or hydronephrosis visualized.  Left Kidney:  Length: 9.9 cm. Echogenicity is increased. No mass or hydronephrosis visualized.  Abdominal aorta:  No aneurysm visualized.  Other findings:  None.  IMPRESSION: 1. No cholelithiasis. Dilated common bile duct measuring 16.1 mm with intrahepatic biliary ductal dilatation. There is no obstructing mass identified. There is echogenic material within the distal common bile duct which may be artifact versus choledocholithiasis. Recommend further evaluation with MRCP.   Electronically Signed   By: Elige Ko   On: 04/19/2013 10:36   Dg Chest Port 1 View  04/19/2013   CLINICAL DATA:  Abdominal pain, weakness, rule out free abdominal air  EXAM: PORTABLE CHEST - 1 VIEW  COMPARISON:  04/18/2013  FINDINGS: Cardiomediastinal silhouette is stable. There is small left pleural effusion with blunting of the left costophrenic angle. No free abdominal air is noted in upper abdomen. No pulmonary edema.  IMPRESSION: Small left pleural effusion with left basilar atelectasis. No subdiaphragmatic free air.   Electronically Signed   By: Natasha Mead M.D.   On: 04/19/2013 20:42   Dg Ercp  04/20/2013   CLINICAL DATA:  Common bile duct stone.  EXAM: ERCP  TECHNIQUE: Multiple spot images obtained with the fluoroscopic device and submitted for interpretation post-procedure.  COMPARISON:  CT scan and ultrasound dated 04/19/2013  FINDINGS: Three spot films are available from the ERCP. There is limited visualization of the biliary tree on the available images.  IMPRESSION: ERCP performed.  See procedure report for further information.  These images were submitted for radiologic interpretation only. Please see the procedural report for the amount of contrast and the fluoroscopy time utilized.    Electronically Signed   By: Geanie Cooley M.D.   On: 04/20/2013 18:18    Medications:  Prior to Admission:  Prescriptions prior to admission  Medication Sig Dispense Refill  . aspirin EC 81 MG tablet Take 81 mg by mouth every evening.       . citalopram (CELEXA) 20 MG tablet Take 20 mg by mouth every morning.       . clopidogrel (PLAVIX) 75 MG tablet Take 75 mg by mouth every morning.       . diltiazem (DILACOR XR) 180 MG 24 hr capsule Take 180 mg by mouth every morning.       . isosorbide mononitrate (IMDUR) 30 MG 24 hr tablet Take 30 mg by mouth every morning.       . lactose free nutrition (BOOST) LIQD Take 237 mLs by mouth 3 (three) times daily.      . metoprolol (LOPRESSOR) 100 MG tablet Take 100 mg by mouth 2 (two) times daily.      . Multiple Vitamin (MULTIVITAMIN WITH MINERALS) TABS tablet Take 1 tablet by mouth every morning.       . nitroGLYCERIN (NITROSTAT) 0.4 MG SL tablet Place 0.4 mg under the tongue every 5 (five) minutes as needed for chest pain.      . pantoprazole (PROTONIX) 40 MG tablet Take 40 mg by mouth every morning.       . polyethylene glycol (MIRALAX / GLYCOLAX) packet Take 17 g by mouth daily as needed for moderate constipation.      . simvastatin (ZOCOR) 10 MG tablet Take  10 mg by mouth every evening.       . Tamsulosin HCl (FLOMAX) 0.4 MG CAPS Take 0.4 mg by mouth every morning.       . VENTOLIN HFA 108 (90 BASE) MCG/ACT inhaler Inhale 2 puffs into the lungs every 4 (four) hours as needed for wheezing or shortness of breath.       . vitamin C (ASCORBIC ACID) 500 MG tablet Take 500 mg by mouth every morning.       . zinc sulfate 220 MG capsule Take 220 mg by mouth every morning.       Scheduled: . aspirin EC  81 mg Oral Daily  . cefTRIAXone (ROCEPHIN)  IV  1 g Intravenous Q24H  . citalopram  20 mg Oral q morning - 10a  . clopidogrel  75 mg Oral q morning - 10a  . docusate sodium  100 mg Oral BID  . metronidazole  500 mg Intravenous Q8H  . pantoprazole  40  mg Oral q morning - 10a  . senna  1 tablet Oral BID  . sodium chloride  1,000 mL Intravenous Once  . tamsulosin  0.4 mg Oral q morning - 10a   Continuous: . sodium chloride 10 mL/hr at 04/20/13 1753   ZOX:[id number], hydrOXYzine, morphine injection, nitroGLYCERIN  Assessment/Plan: Pleasant 79 year old male presented to the hospital with nausea vomiting labs and ultrasound consistent with biliary obstruction. He is now status post ERCP with sphincterotomy. Currently he is tolerating by mouth intake. Patient felt to have probable passed a common bile duct stone. Slowly advance diet, at this time do not feel antibiotics are indicated. With continued tolerance of diet discharged home with outpatient discussion of cholecystitis, if nausea vomiting abdominal pain, elevation of LFTs, bilirubin surgical consultation. Chronic kidney disease stage III , followup creatinine improved Anemia probably secondary to chronic disease, followup H&H improved Coronary artery disease, LV dysfunction. BNP elevated after fluid challenge. IV fluids will be saline locked  History of DVT PE status post Greenfield filter  Peripheral vascular disease  History of asthma  History bladder outlet obstruction  History of CVA with left hemiparesis, dysphagia   LOS: 2 days   Breeona Waid D 04/21/2013, 8:31 AM

## 2013-04-21 NOTE — Progress Notes (Signed)
INITIAL NUTRITION ASSESSMENT  Pt meets criteria for severe MALNUTRITION in the context of chronic illness as evidenced by <75% estimated energy intake in the past month with 8.7% weight loss in the past 2 months.  DOCUMENTATION CODES Per approved criteria  -Severe malnutrition in the context of chronic illness   INTERVENTION: - Boost Plus TID - Encouraged increased meal intake - Will continue to monitor  NUTRITION DIAGNOSIS: Unintended weight loss related to nausea/vomiting as evidenced by pt report.   Goal: 1. No further nausea/vomiting 2. Pt to consume >90% of meals/supplements  Monitor:  Weights, labs, intake, nausea/vomiting  Reason for Assessment: Malnutrition screening tool   79 y.o. male  Admitting Dx: Abdominal pain, nausea/vomiting   ASSESSMENT: Admitted with failure to thrive and abdominal pain. Hx of multiple bowel obstructions. Met with pt who reports he has been unable to keep any food down since Sunday PTA r/t vomiting every time he tried to eat something. States before then he was eating good, 3 meals/day and drink 3 Boost/day. Reports tolerating full liquid diet this morning, denies any nausea/vomiting. Requests Boost. Reports 10 pound unintended weight loss in the past 4 weeks.   Alk phos, lipase, AST/ALT, and total bilirubin elevated.   Nutrition Focused Physical Exam:  Subcutaneous Fat:  Orbital Region: mild/moderate wasting Upper Arm Region: mild/moderate wasting Thoracic and Lumbar Region: NA  Muscle:  Temple Region: mild/moderate wasting Clavicle Bone Region: mild/moderate wasting Clavicle and Acromion Bone Region: mild/moderate wasting Scapular Bone Region: NA Dorsal Hand: mild/moderate wasting Patellar Region: mild/moderate wasting Anterior Thigh Region: mild/moderate wasting Posterior Calf Region: mild/moderate wasting  Edema: None noted    Height: Ht Readings from Last 1 Encounters:  04/19/13 5\' 7"  (1.702 m)    Weight: Wt Readings  from Last 1 Encounters:  04/19/13 125 lb 10.6 oz (57 kg)    Ideal Body Weight: 148 lb   % Ideal Body Weight: 84%  Wt Readings from Last 10 Encounters:  04/19/13 125 lb 10.6 oz (57 kg)  04/19/13 125 lb 10.6 oz (57 kg)  02/28/13 137 lb (62.143 kg)  01/03/13 138 lb (62.596 kg)  08/09/12 141 lb (63.957 kg)  05/10/12 136 lb (61.689 kg)  01/26/12 124 lb (56.246 kg)  10/20/11 134 lb (60.782 kg)  10/12/11 141 lb 3.2 oz (64.048 kg)  10/12/11 141 lb 3.2 oz (64.048 kg)    Usual Body Weight: 135 lb per pt  % Usual Body Weight: 92%  BMI:  Body mass index is 19.68 kg/(m^2).  Estimated Nutritional Needs: Kcal: 1700-1900 Protein: 70-90g Fluid: 1.7-1.9L/day  Skin: WNL  Diet Order: Full Liquid  EDUCATION NEEDS: -No education needs identified at this time   Intake/Output Summary (Last 24 hours) at 04/21/13 0948 Last data filed at 04/21/13 0547  Gross per 24 hour  Intake 1783.5 ml  Output   1175 ml  Net  608.5 ml    Last BM: 12/18  Labs:   Recent Labs Lab 04/19/13 2000 04/20/13 0545 04/21/13 0417  NA 141 138 138  K 3.8 3.3* 4.2  CL 106 109 111  CO2 20 19 17*  BUN 27* 24* 23  CREATININE 1.46* 1.15 0.98  CALCIUM 9.6 7.6* 8.6  MG  --  1.7 1.9  GLUCOSE 123* 96 126*    CBG (last 3)   Recent Labs  04/20/13 1506 04/20/13 1508 04/20/13 1541  GLUCAP 66* 63* 110*    Scheduled Meds: . aspirin EC  81 mg Oral Daily  . citalopram  20 mg Oral  q morning - 10a  . clopidogrel  75 mg Oral q morning - 10a  . docusate sodium  100 mg Oral BID  . lactose free nutrition  237 mL Oral TID WC  . pantoprazole  40 mg Oral q morning - 10a  . senna  1 tablet Oral BID  . sodium chloride  1,000 mL Intravenous Once  . tamsulosin  0.4 mg Oral q morning - 10a    Continuous Infusions: . sodium chloride 10 mL/hr at 04/20/13 1753    Past Medical History  Diagnosis Date  . Asthma   . Hypertension   . Pulmonary embolism   . Peripheral artery disease   . Hypercholesteremia   .  Bursitis of right shoulder   . DVT of leg (deep venous thrombosis)   . Cough   . Trouble swallowing   . Change in voice   . Difficulty urinating   . Easy bruising   . CAD (coronary artery disease) 10/02/2011    Severe 2 Vessel CAD with early Mid LAD 95% focal lesion followed by 60-70% irregular stenoses just after bifurcating SP1 and AV Groove Cx-OM1/OM2 bifurcation 90% lesion  . Stroke Feb. 2013  . Pneumonia Feb. 2013  . Myocardial infarction Sep 26, 2011  . Fall at home Sept. 2, 2014    Pt fell down 2 stairs    Past Surgical History  Procedure Laterality Date  . Hernia repair    . Appendectomy    . Femoral-femoral bypass graft    . Iliac artery stent    . Esophagogastroduodenoscopy  07/03/2011    Procedure: ESOPHAGOGASTRODUODENOSCOPY (EGD);  Surgeon: Shirley Friar, MD;  Location: [facility];  Service: Endoscopy;  Laterality: N/A;  . Compression hip screw  06/26/2011    Procedure: COMPRESSION HIP;  Surgeon: Raymon Mutton, MD;  Location: [facility];  Service: Orthopedics;  Laterality: Right;  . Laparoscopy  07/10/2011    Procedure: LAPAROSCOPY DIAGNOSTIC;  Surgeon: Ardeth Sportsman, MD;  Location: [facility];  Service: General;  Laterality: N/A;  serousal repair,  . Esophagogastroduodenoscopy  09/28/2011    Procedure: ESOPHAGOGASTRODUODENOSCOPY (EGD);  Surgeon: Willis Modena, MD;  Location: [facility];  Service: Endoscopy;  Laterality: N/A;  . Femoral-femoral bypass graft  10/05/2011    Procedure: BYPASS GRAFT FEMORAL-FEMORAL ARTERY;  Surgeon: Larina Earthly, MD;  Location: [facility] OR;  Service: Vascular;  Laterality: Bilateral;  Thrombectomy of Femoral-Femoral artery bypass graft  . Fracture surgery  06/24/11    Right Hip  . Joint replacement  06/24/11    Right Hip  . Small intestine surgery  March 2013  . Ercp N/A 04/20/2013    Procedure: ENDOSCOPIC RETROGRADE CHOLANGIOPANCREATOGRAPHY (ERCP);  Surgeon: Petra Kuba, MD;  Location: [facility];  Service: Endoscopy;  Laterality: N/A;    Levon Hedger MS, RD, LDN [phone] Pager [phone] After Hours Pager

## 2013-04-22 LAB — CBC WITH DIFFERENTIAL/PLATELET
Basophils Absolute: 0 10*3/uL (ref 0.0–0.1)
Basophils Relative: 0 % (ref 0–1)
Eosinophils Absolute: 0 10*3/uL (ref 0.0–0.7)
Hemoglobin: 8.8 g/dL — ABNORMAL LOW (ref 13.0–17.0)
Lymphocytes Relative: 9 % — ABNORMAL LOW (ref 12–46)
MCH: 26.5 pg (ref 26.0–34.0)
MCHC: 32.8 g/dL (ref 30.0–36.0)
Monocytes Absolute: 0.4 10*3/uL (ref 0.1–1.0)
Monocytes Relative: 5 % (ref 3–12)
Neutro Abs: 7.6 10*3/uL (ref 1.7–7.7)
Neutrophils Relative %: 86 % — ABNORMAL HIGH (ref 43–77)
Platelets: 152 10*3/uL (ref 150–400)
RDW: 20.1 % — ABNORMAL HIGH (ref 11.5–15.5)
WBC: 8.8 10*3/uL (ref 4.0–10.5)

## 2013-04-22 LAB — LACTIC ACID, PLASMA: Lactic Acid, Venous: 1.6 mmol/L (ref 0.5–2.2)

## 2013-04-22 LAB — COMPREHENSIVE METABOLIC PANEL
ALT: 56 U/L — ABNORMAL HIGH (ref 0–53)
AST: 49 U/L — ABNORMAL HIGH (ref 0–37)
Alkaline Phosphatase: 353 U/L — ABNORMAL HIGH (ref 39–117)
BUN: 21 mg/dL (ref 6–23)
CO2: 19 mEq/L (ref 19–32)
Calcium: 8.5 mg/dL (ref 8.4–10.5)
Chloride: 109 mEq/L (ref 96–112)
GFR calc Af Amer: 90 mL/min — ABNORMAL LOW (ref >90)
GFR calc non Af Amer: 78 mL/min — ABNORMAL LOW (ref >90)
Glucose, Bld: 141 mg/dL — ABNORMAL HIGH (ref 70–99)
Potassium: 3.9 mEq/L (ref 3.5–5.1)
Sodium: 138 mEq/L (ref 135–145)
Total Bilirubin: 4.4 mg/dL — ABNORMAL HIGH (ref 0.3–1.2)
Total Protein: 5.8 g/dL — ABNORMAL LOW (ref 6.0–8.3)

## 2013-04-22 LAB — URINE CULTURE: Colony Count: NO GROWTH

## 2013-04-22 LAB — MAGNESIUM: Magnesium: 1.9 mg/dL (ref 1.5–2.5)

## 2013-04-22 MED ORDER — ENOXAPARIN SODIUM 40 MG/0.4ML ~~LOC~~ SOLN
40.0000 mg | SUBCUTANEOUS | Status: DC
  Administered 2013-04-22: 40 mg via SUBCUTANEOUS
  Filled 2013-04-22 (×2): qty 0.4

## 2013-04-22 NOTE — Plan of Care (Signed)
Problem: Phase I Progression Outcomes Goal: OOB as tolerated unless otherwise ordered Outcome: Completed/Met Date Met:  04/22/13 Up with assist. Recently ambulated by PT in hall.

## 2013-04-22 NOTE — Progress Notes (Signed)
Physical Therapy Treatment Patient Details Name: Marquavion Venhuizen MRN: [id number] DOB: Jun 03, 1933 Today's Date: 04/22/2013 Time: 7829-5621 PT Time Calculation (min): 25 min  PT Assessment / Plan / Recommendation  History of Present Illness 79 y.o. BM PMHx  right intertrochanteric hip fracture February 2013, PE July 2012, S/P IVC placement 10/03/2011, PVD thrombosed R-L BPG, S/P lobectomy 10/05/2011, DVT 3/13, CVA February 2013 (residual Dysphasia), cardiomyopathy EF 40-50%, SBO lysis of adhesions February 2013, Hx acute respiratory failure with hypoxia, renal insufficiency,Hx NSTEMI. Patient sees [facility] gastroenterology. Per Dr. Oletha Cruel GI contacted concerning patient:, last seen by Dr. Willis Modena 09/27/2011 GI bleed.     PT Comments   Pt feeling better but did c/o mild upset stomach.  Assisted pt OOB to amb in hallway then back to bed per pt request to rest.   Follow Up Recommendations  Home health PT;Supervision for mobility/OOB     Does the patient have the potential to tolerate intense rehabilitation     Barriers to Discharge        Equipment Recommendations  None recommended by PT    Recommendations for Other Services    Frequency Min 3X/week   Progress towards PT Goals    Plan      Precautions / Restrictions Precautions Precautions: Fall Restrictions Other Position/Activity Restrictions: states he has old fx of L arm (?May) and Dr told him to put NWB however states MD told him last visit it was 90% healed and he has been using RW at home    Pertinent Vitals/Pain No c/o pain    Mobility  Bed Mobility Bed Mobility: Supine to Sit Supine to Sit: 4: Min guard Details for Bed Mobility Assistance: increased time Transfers Transfers: Sit to Stand;Stand to Sit Sit to Stand: 4: Min assist;4: Min guard;With upper extremity assist;From bed Stand to Sit: 4: Min guard;With upper extremity assist;To chair/3-in-1 Details for Transfer Assistance: assist to rise initially,  verbal cues for hand placement Ambulation/Gait Ambulation/Gait Assistance: 4: Min guard Ambulation Distance (Feet): 75 Feet Assistive device: Rolling walker Ambulation/Gait Assistance Details: increased time and caution to L LE weaker.   Gait Pattern: Decreased dorsiflexion - left;Decreased hip/knee flexion - left;Step-through pattern;Trunk flexed;Decreased stride length Gait velocity: decreased    PT Goals (current goals can now be found in the care plan section)    Visit Information  Last PT Received On: 04/22/13 Assistance Needed: +1 History of Present Illness: 79 y.o. BM PMHx  right intertrochanteric hip fracture February 2013, PE July 2012, S/P IVC placement 10/03/2011, PVD thrombosed R-L BPG, S/P lobectomy 10/05/2011, DVT 3/13, CVA February 2013 (residual Dysphasia), cardiomyopathy EF 40-50%, SBO lysis of adhesions February 2013, Hx acute respiratory failure with hypoxia, renal insufficiency,Hx NSTEMI. Patient sees [facility] gastroenterology. Per Dr. Oletha Cruel GI contacted concerning patient:, last seen by Dr. Willis Modena 09/27/2011 GI bleed.      Subjective Data      Cognition       Balance     End of Session PT - End of Session Equipment Utilized During Treatment: Gait belt Activity Tolerance: Patient limited by fatigue Patient left: in bed   Felecia Shelling  PTA [facility]  Acute  Rehab Pager      [phone]

## 2013-04-22 NOTE — Progress Notes (Signed)
Subjective: Feels much better, no complaints  Objective: Vital signs in last 24 hours: Temp:  [97.4 F (36.3 C)-98.1 F (36.7 C)] 97.5 F (36.4 C) (12/20 1027) Pulse Rate:  [91-98] 91 (12/20 1027) Resp:  [16-20] 16 (12/20 1027) BP: (138-161)/(69-81) 160/74 mmHg (12/20 1027) SpO2:  [95 %-100 %] 100 % (12/20 1027) Weight change:  Last BM Date: 04/21/13  Intake/Output from previous day: 12/19 0701 - 12/20 0700 In: 690 [P.O.:460; I.V.:230] Out: 850 [Urine:850] Intake/Output this shift: Total I/O In: 360 [P.O.:360] Out: 150 [Urine:150]  General appearance: alert and cooperative Resp: clear to auscultation bilaterally Cardio: regular rate and rhythm, S1, S2 normal, no murmur, click, rub or gallop GI: soft, non-tender; bowel sounds normal; no masses,  no organomegaly Extremities: extremities normal, atraumatic, no cyanosis or edema  Lab Results:  Recent Labs  04/21/13 0417 04/22/13 0534  WBC 3.5* 8.8  HGB 9.2* 8.8*  HCT 28.6* 26.8*  PLT 164 152   BMET  Recent Labs  04/21/13 0417 04/22/13 0534  NA 138 138  K 4.2 3.9  CL 111 109  CO2 17* 19  GLUCOSE 126* 141*  BUN 23 21  CREATININE 0.98 0.97  CALCIUM 8.6 8.5    Studies/Results: Dg Ercp  04/20/2013   CLINICAL DATA:  Common bile duct stone.  EXAM: ERCP  TECHNIQUE: Multiple spot images obtained with the fluoroscopic device and submitted for interpretation post-procedure.  COMPARISON:  CT scan and ultrasound dated 04/19/2013  FINDINGS: Three spot films are available from the ERCP. There is limited visualization of the biliary tree on the available images.  IMPRESSION: ERCP performed.  See procedure report for further information.  These images were submitted for radiologic interpretation only. Please see the procedural report for the amount of contrast and the fluoroscopy time utilized.   Electronically Signed   By: Geanie Cooley M.D.   On: 04/20/2013 18:18    Medications: I have reviewed the patient's current  medications.  Assessment/Plan: Pleasant 79 year old male presented to the hospital with nausea vomiting labs and ultrasound consistent with biliary obstruction. He is now status post ERCP with sphincterotomy. Currently he is tolerating by mouth intake. Patient felt to have probable passed a common bile duct stone. Advancing diet. Hope to discharge tomorrow with Home health Chronic kidney disease stage III , followup creatinine improved  Anemia probably secondary to chronic disease, followup H&H improved  Coronary artery disease, LV dysfunction. BNP elevated after fluid challenge. IV fluids will be saline locked  Gerd on ppi History of DVT PE status post Greenfield filter  Peripheral vascular disease  History of asthma  History bladder outlet obstruction  History of CVA with left hemiparesis, dysphagia   LOS: 3 days   [name] JOSEPH 04/22/2013, 11:35 AM

## 2013-04-22 NOTE — Progress Notes (Signed)
Subjective: Some heartburn. No abdominal pain; is tolerating full liquids.  Objective: Vital signs in last 24 hours: Temp:  [97.4 F (36.3 C)-98.1 F (36.7 C)] 97.5 F (36.4 C) (12/20 1027) Pulse Rate:  [91-98] 91 (12/20 1027) Resp:  [16-20] 16 (12/20 1027) BP: (138-161)/(69-81) 160/74 mmHg (12/20 1027) SpO2:  [95 %-100 %] 100 % (12/20 1027) Weight change:  Last BM Date: 04/21/13  PE: GEN:  NAD ABD:  Soft, active bowel sounds, non-tender  Lab Results: CBC    Component Value Date/Time   WBC 8.8 04/22/2013 0534   RBC 3.32* 04/22/2013 0534   RBC 3.97* 02/01/2009 1647   HGB 8.8* 04/22/2013 0534   HCT 26.8* 04/22/2013 0534   PLT 152 04/22/2013 0534   MCV 80.7 04/22/2013 0534   MCH 26.5 04/22/2013 0534   MCHC 32.8 04/22/2013 0534   RDW 20.1* 04/22/2013 0534   LYMPHSABS 0.8 04/22/2013 0534   MONOABS 0.4 04/22/2013 0534   EOSABS 0.0 04/22/2013 0534   BASOSABS 0.0 04/22/2013 0534   CMP     Component Value Date/Time   NA 138 04/22/2013 0534   K 3.9 04/22/2013 0534   CL 109 04/22/2013 0534   CO2 19 04/22/2013 0534   GLUCOSE 141* 04/22/2013 0534   BUN 21 04/22/2013 0534   CREATININE 0.97 04/22/2013 0534   CALCIUM 8.5 04/22/2013 0534   PROT 5.8* 04/22/2013 0534   ALBUMIN 1.9* 04/22/2013 0534   AST 49* 04/22/2013 0534   ALT 56* 04/22/2013 0534   ALKPHOS 353* 04/22/2013 0534   BILITOT 4.4* 04/22/2013 0534   GFRNONAA 78* 04/22/2013 0534   GFRAA 90* 04/22/2013 0534   Assessment:  1.  Presumed gallstone pancreatitis, post ERCP.  Clinically improving. 2.  Elevated LFTs, likely from #1 above, down-trending. 3.  GERD.  Plan:  1.  Elevate HOB at least 30 degrees at all times and PPI for GERD. 2.  Advance diet. 3.  If tolerates advance in diet, would likely be able to discharge home with outpatient follow-up with Dr. Ewing Schlein (who can determine whether or cholecystectomy should be considered or whether we should just see how patient does since he had a biliary  sphincterotomy). 4.  Case discussed with Dr. Valentina Lucks.   Freddy Jaksch 04/22/2013, 11:29 AM

## 2013-04-23 LAB — COMPREHENSIVE METABOLIC PANEL
ALT: 63 U/L — ABNORMAL HIGH (ref 0–53)
Albumin: 1.8 g/dL — ABNORMAL LOW (ref 3.5–5.2)
Alkaline Phosphatase: 381 U/L — ABNORMAL HIGH (ref 39–117)
Calcium: 8.2 mg/dL — ABNORMAL LOW (ref 8.4–10.5)
GFR calc Af Amer: >90 mL/min (ref >90)
Glucose, Bld: 126 mg/dL — ABNORMAL HIGH (ref 70–99)
Potassium: 3.7 mEq/L (ref 3.5–5.1)
Sodium: 140 mEq/L (ref 135–145)
Total Protein: 5.9 g/dL — ABNORMAL LOW (ref 6.0–8.3)

## 2013-04-23 LAB — CBC WITH DIFFERENTIAL/PLATELET
Eosinophils Absolute: 0 10*3/uL (ref 0.0–0.7)
HCT: 28.7 % — ABNORMAL LOW (ref 39.0–52.0)
Hemoglobin: 9.3 g/dL — ABNORMAL LOW (ref 13.0–17.0)
Lymphocytes Relative: 17 % (ref 12–46)
Lymphs Abs: 1 10*3/uL (ref 0.7–4.0)
MCH: 25.7 pg — ABNORMAL LOW (ref 26.0–34.0)
MCHC: 32.4 g/dL (ref 30.0–36.0)
Monocytes Absolute: 0.5 10*3/uL (ref 0.1–1.0)
Monocytes Relative: 8 % (ref 3–12)
Neutro Abs: 4.3 10*3/uL (ref 1.7–7.7)
Neutrophils Relative %: 74 % (ref 43–77)
RBC: 3.62 MIL/uL — ABNORMAL LOW (ref 4.22–5.81)
WBC: 5.8 10*3/uL (ref 4.0–10.5)

## 2013-04-23 LAB — LACTIC ACID, PLASMA: Lactic Acid, Venous: 1.5 mmol/L (ref 0.5–2.2)

## 2013-04-23 NOTE — Discharge Summary (Signed)
Physician Discharge Summary  Patient ID: [name] MRN: [id number] DOB/AGE: 1933-10-23 79 y.o.  Admit date: 04/19/2013 Discharge date: 04/23/2013  Admission Diagnoses: Cholangitis Peripheral vascular disease Coronary artery disease History of CVA Ischemic retinopathy  Discharge Diagnoses:  Active Problems: Cholangitis   PE (pulmonary embolism) July 2012, IVC filter placed 10/03/11   PVD, thrombosed R-L BPG, S/P thrombectomy 10/05/11   SBO (small bowel obstruction), lysis of adhesions Feb 2013   Chronic constipation   Acute respiratory failure with hypoxia   Brunner's gland hyperplasia of duodenum   Dysphagia S/P CVA (cerebrovascular accident)   NSTEMI, BMS to CFX/LAD  10/03/11   DVT 3/13   CVA, 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 CT scan of the abdomen and pelvis showed no biliary dilation to the level of the ampulla. Ultrasound of the abdomen showed no cholelithiasis, there was dilated common bile duct to 16.1 mm with intrahepatic ductal dilation and echogenic material within the distal common bile duct consistent with possible choledocholithiasis. The patient was started on empiric IV antibiotics. He had ERCP by Dr. Leary Roca on December 18 which showed a bulbous ampulla with slight ulceration, sphincterotomy was done. The common bile duct and intrahepatic ducts were normal without evidence of stone. The patient's clinical symptoms markedly improved with resolution of abdominal pain and nausea vomiting, diet was advanced and tolerated. At discharge LFTs were gradually improving. Follow up with GI as an outpatient with consideration  for cholecystectomy.  Consults: GI  Significant Diagnostic Studies: labs: Sodium 140 potassium 3.7 chloride 110 bicarbonate 21 BUN 17 creatinine 0.9 albumin 1.8 AST 58 ALT 63 alkaline phosphatase 381 total bilirubin 4.6 and radiology: CT scan: As above and Ultrasound: As above  Treatments: IV hydration, antibiotics: ceftriaxone and metronidazole and ERCP a sphincterotomy  Discharge Exam: Blood pressure 169/95, pulse 123, temperature 97.3 F (36.3 C), temperature source Oral, resp. rate 18, height 5\' 7"  (1.702 m), weight 57 kg (125 lb 10.6 oz), SpO2 98.00%. General appearance: alert and cooperative Resp: clear to auscultation bilaterally Cardio: regular rate and rhythm, S1, S2 normal, no murmur, click, rub or gallop GI: soft, non-tender; bowel sounds normal; no masses,  no organomegaly  Disposition: 01-Home or Self Care   Future Appointments Provider Department Dept Phone   04/28/2013 12:00 PM [facility] [phone]   Please arrive 15 minutes prior to your appointment time.   09/07/2013 9:30 AM [facility] [place] [facility] [address] [phone]   09/07/2013 10:30 AM [facility] [place] [facility] [address] [phone]   09/07/2013 11:00 AM Carma Lair Nickel, NP [facility] [phone]       Medication List         aspirin EC 81 MG tablet  Take 81 mg by mouth every evening.     citalopram 20 MG tablet  Commonly known as:  CELEXA  Take 20 mg by mouth every morning.     clopidogrel 75 MG tablet  Commonly known as:  PLAVIX  Take 75 mg by mouth every morning.     diltiazem 180 MG 24 hr capsule  Commonly known as:  DILACOR XR  Take 180 mg by mouth every morning.     FLOMAX 0.4 MG Caps capsule  Generic drug:  tamsulosin  Take 0.4 mg by mouth every morning.     isosorbide mononitrate 30 MG 24 hr tablet  Commonly known as:  IMDUR  Take 30 mg by mouth every morning.     lactose free  nutrition Liqd  Take 237 mLs by mouth 3 (three) times daily.     metoprolol 100 MG tablet  Commonly known as:  LOPRESSOR  Take 100 mg by mouth 2 (two) times daily.     multivitamin with minerals Tabs tablet  Take 1 tablet by mouth every morning.     nitroGLYCERIN 0.4 MG SL tablet  Commonly known as:  NITROSTAT  Place 0.4 mg under the tongue every 5 (five) minutes as needed for chest pain.     pantoprazole 40 MG tablet  Commonly known as:  PROTONIX  Take 40 mg by mouth every morning.     polyethylene glycol packet  Commonly known as:  MIRALAX / GLYCOLAX  Take 17 g by mouth daily as needed for moderate constipation.     simvastatin 10 MG tablet  Commonly known as:  ZOCOR  Take 10 mg by mouth every evening.     VENTOLIN HFA 108 (90 BASE) MCG/ACT inhaler  Generic drug:  albuterol  Inhale 2 puffs into the lungs every 4 (four) hours as needed for wheezing or shortness of breath.     vitamin C 500 MG tablet  Commonly known as:  ASCORBIC ACID  Take 500 mg by mouth every morning.     zinc sulfate 220 MG capsule  Take 220 mg by mouth every morning.           Follow-up Information   Follow up with [facility], MD In 2 weeks.   Specialty:  Gastroenterology   Contact information:   [address], [address] [place] [address] [phone]       Follow up with Katy Apo, MD In 2 weeks.   Specialty:  Internal Medicine   Contact information:   [address] [place] [address] [phone]       Signed: Lillia Mountain 04/23/2013, 12:58 PM

## 2013-04-23 NOTE — Progress Notes (Signed)
   CARE MANAGEMENT NOTE 04/23/2013  Patient:  [name], [name]   Account Number:  [id number]  Date Initiated:  04/20/2013  Documentation initiated by:  DAVIS,RHONDA  Subjective/Objective Assessment:   pt with abd pain found to have obstruction of the cbd-for ERCP, hypotensive required iv ns boluses, boarderline sepsis per the labs     Action/Plan:   home when stable is normally able to care for himself.   Anticipated DC Date:  04/23/2013   Anticipated DC Plan:  HOME/SELF CARE  In-house referral  NA      DC Planning Services  NA      [facility] Choice  HOME HEALTH   Choice offered to / List presented to:  C-1 Patient   DME arranged  NA      DME agency  NA     HH arranged  HH-2 PT      HH agency  [facility]   Status of service:  Completed, signed off Medicare Important Message given?  NA - LOS <3 / Initial given by admissions (If response is "NO", the following Medicare IM given date fields will be blank) Date Medicare IM given:   Date Additional Medicare IM given:    Discharge Disposition:  HOME W HOME HEALTH SERVICES  Per UR Regulation:  Reviewed for med. necessity/level of care/duration of stay  If discussed at Long Length of Stay Meetings, dates discussed:    Comments:  04/23/2013 1330 NCM spoke to pt and offered choice. Pt requested [facility] for HH. Has RW at home. Dtr and son assist him and his wife at home with appts, cleaning and other chores in the home. Isidoro Donning RN CCM Case Mgmt phone [phone]  [phone] Earlene Plater RN, BSN, [place] [phone] Chart Reviewed for discharge and hospital needs. Discharge needs at time of review:  None present will follow for needs. Review of patient progress due on [id number].

## 2013-04-25 ENCOUNTER — Other Ambulatory Visit (HOSPITAL_COMMUNITY): Payer: Medicare HMO

## 2013-04-26 ENCOUNTER — Other Ambulatory Visit: Payer: Medicare HMO

## 2013-04-28 ENCOUNTER — Ambulatory Visit (HOSPITAL_COMMUNITY): Payer: Medicare HMO

## 2013-05-05 ENCOUNTER — Inpatient Hospital Stay (HOSPITAL_COMMUNITY)
Admission: EM | Admit: 2013-05-05 | Discharge: 2013-05-07 | DRG: 377 | Disposition: A | Discharge status: Home Health | Payer: Medicare HMO | Attending: Family Medicine | Admitting: Family Medicine

## 2013-05-05 ENCOUNTER — Encounter (HOSPITAL_COMMUNITY): Payer: Self-pay | Admitting: Emergency Medicine

## 2013-05-05 DIAGNOSIS — Z79899 Other long term (current) drug therapy: Secondary | ICD-10-CM

## 2013-05-05 DIAGNOSIS — K56609 Unspecified intestinal obstruction, unspecified as to partial versus complete obstruction: Secondary | ICD-10-CM

## 2013-05-05 DIAGNOSIS — K810 Acute cholecystitis: Secondary | ICD-10-CM

## 2013-05-05 DIAGNOSIS — I639 Cerebral infarction, unspecified: Secondary | ICD-10-CM

## 2013-05-05 DIAGNOSIS — Z8719 Personal history of other diseases of the digestive system: Secondary | ICD-10-CM

## 2013-05-05 DIAGNOSIS — R109 Unspecified abdominal pain: Secondary | ICD-10-CM

## 2013-05-05 DIAGNOSIS — I959 Hypotension, unspecified: Secondary | ICD-10-CM

## 2013-05-05 DIAGNOSIS — D649 Anemia, unspecified: Secondary | ICD-10-CM

## 2013-05-05 DIAGNOSIS — Z87891 Personal history of nicotine dependence: Secondary | ICD-10-CM

## 2013-05-05 DIAGNOSIS — K3189 Other diseases of stomach and duodenum: Secondary | ICD-10-CM

## 2013-05-05 DIAGNOSIS — I429 Cardiomyopathy, unspecified: Secondary | ICD-10-CM

## 2013-05-05 DIAGNOSIS — Z86718 Personal history of other venous thrombosis and embolism: Secondary | ICD-10-CM

## 2013-05-05 DIAGNOSIS — I2510 Atherosclerotic heart disease of native coronary artery without angina pectoris: Secondary | ICD-10-CM | POA: Diagnosis present

## 2013-05-05 DIAGNOSIS — K5909 Other constipation: Secondary | ICD-10-CM | POA: Diagnosis present

## 2013-05-05 DIAGNOSIS — R338 Other retention of urine: Secondary | ICD-10-CM

## 2013-05-05 DIAGNOSIS — N289 Disorder of kidney and ureter, unspecified: Secondary | ICD-10-CM

## 2013-05-05 DIAGNOSIS — I428 Other cardiomyopathies: Secondary | ICD-10-CM | POA: Diagnosis present

## 2013-05-05 DIAGNOSIS — I69391 Dysphagia following cerebral infarction: Secondary | ICD-10-CM

## 2013-05-05 DIAGNOSIS — K8301 Primary sclerosing cholangitis: Secondary | ICD-10-CM

## 2013-05-05 DIAGNOSIS — E43 Unspecified severe protein-calorie malnutrition: Secondary | ICD-10-CM | POA: Diagnosis present

## 2013-05-05 DIAGNOSIS — N401 Enlarged prostate with lower urinary tract symptoms: Secondary | ICD-10-CM

## 2013-05-05 DIAGNOSIS — I7092 Chronic total occlusion of artery of the extremities: Secondary | ICD-10-CM

## 2013-05-05 DIAGNOSIS — Z7982 Long term (current) use of aspirin: Secondary | ICD-10-CM

## 2013-05-05 DIAGNOSIS — I214 Non-ST elevation (NSTEMI) myocardial infarction: Secondary | ICD-10-CM

## 2013-05-05 DIAGNOSIS — K922 Gastrointestinal hemorrhage, unspecified: Principal | ICD-10-CM | POA: Diagnosis present

## 2013-05-05 DIAGNOSIS — I69991 Dysphagia following unspecified cerebrovascular disease: Secondary | ICD-10-CM

## 2013-05-05 DIAGNOSIS — I252 Old myocardial infarction: Secondary | ICD-10-CM

## 2013-05-05 DIAGNOSIS — Z7901 Long term (current) use of anticoagulants: Secondary | ICD-10-CM

## 2013-05-05 DIAGNOSIS — Z87448 Personal history of other diseases of urinary system: Secondary | ICD-10-CM

## 2013-05-05 DIAGNOSIS — Z86711 Personal history of pulmonary embolism: Secondary | ICD-10-CM

## 2013-05-05 DIAGNOSIS — I739 Peripheral vascular disease, unspecified: Secondary | ICD-10-CM | POA: Diagnosis present

## 2013-05-05 DIAGNOSIS — J9601 Acute respiratory failure with hypoxia: Secondary | ICD-10-CM

## 2013-05-05 DIAGNOSIS — E7800 Pure hypercholesterolemia, unspecified: Secondary | ICD-10-CM | POA: Diagnosis present

## 2013-05-05 DIAGNOSIS — R000 Tachycardia, unspecified: Secondary | ICD-10-CM

## 2013-05-05 DIAGNOSIS — I10 Essential (primary) hypertension: Secondary | ICD-10-CM | POA: Diagnosis present

## 2013-05-05 DIAGNOSIS — D62 Acute posthemorrhagic anemia: Secondary | ICD-10-CM | POA: Diagnosis present

## 2013-05-05 DIAGNOSIS — I2699 Other pulmonary embolism without acute cor pulmonale: Secondary | ICD-10-CM | POA: Diagnosis present

## 2013-05-05 DIAGNOSIS — K5641 Fecal impaction: Secondary | ICD-10-CM

## 2013-05-05 DIAGNOSIS — J45909 Unspecified asthma, uncomplicated: Secondary | ICD-10-CM | POA: Diagnosis present

## 2013-05-05 LAB — CBC WITH DIFFERENTIAL/PLATELET
BASOS PCT: 0 % (ref 0–1)
Basophils Absolute: 0 10*3/uL (ref 0.0–0.1)
EOS ABS: 0.1 10*3/uL (ref 0.0–0.7)
EOS PCT: 3 % (ref 0–5)
HCT: 22.5 % — ABNORMAL LOW (ref 39.0–52.0)
Hemoglobin: 6.7 g/dL — CL (ref 13.0–17.0)
LYMPHS ABS: 1 10*3/uL (ref 0.7–4.0)
Lymphocytes Relative: 25 % (ref 12–46)
MCH: 27.5 pg (ref 26.0–34.0)
MCHC: 29.8 g/dL — AB (ref 30.0–36.0)
MCV: 92.2 fL (ref 78.0–100.0)
MONOS PCT: 9 % (ref 3–12)
Monocytes Absolute: 0.4 10*3/uL (ref 0.1–1.0)
NEUTROS PCT: 62 % (ref 43–77)
Neutro Abs: 2.4 10*3/uL (ref 1.7–7.7)
PLATELETS: 313 10*3/uL (ref 150–400)
RBC: 2.44 MIL/uL — ABNORMAL LOW (ref 4.22–5.81)
RDW: 23.2 % — ABNORMAL HIGH (ref 11.5–15.5)
WBC: 3.8 10*3/uL — ABNORMAL LOW (ref 4.0–10.5)

## 2013-05-05 LAB — COMPREHENSIVE METABOLIC PANEL
ALBUMIN: 2.7 g/dL — AB (ref 3.5–5.2)
ALT: 70 U/L — ABNORMAL HIGH (ref 0–53)
AST: 59 U/L — AB (ref 0–37)
Alkaline Phosphatase: 492 U/L — ABNORMAL HIGH (ref 39–117)
BUN: 22 mg/dL (ref 6–23)
CO2: 23 mEq/L (ref 19–32)
CREATININE: 1.21 mg/dL (ref 0.50–1.35)
Calcium: 8.8 mg/dL (ref 8.4–10.5)
Chloride: 104 mEq/L (ref 96–112)
GFR calc Af Amer: 65 mL/min — ABNORMAL LOW (ref >90)
GFR calc non Af Amer: 56 mL/min — ABNORMAL LOW (ref >90)
Glucose, Bld: 123 mg/dL — ABNORMAL HIGH (ref 70–99)
Potassium: 4.5 mEq/L (ref 3.7–5.3)
Sodium: 139 mEq/L (ref 137–147)
TOTAL PROTEIN: 7.1 g/dL (ref 6.0–8.3)
Total Bilirubin: 1.7 mg/dL — ABNORMAL HIGH (ref 0.3–1.2)

## 2013-05-05 LAB — APTT: APTT: 27 s (ref 24–37)

## 2013-05-05 LAB — PREPARE RBC (CROSSMATCH)

## 2013-05-05 LAB — PROTIME-INR
INR: 1.09 (ref 0.00–1.49)
Prothrombin Time: 13.9 seconds (ref 11.6–15.2)

## 2013-05-05 LAB — OCCULT BLOOD, POC DEVICE: Fecal Occult Bld: POSITIVE — AB

## 2013-05-05 MED ORDER — DOCUSATE SODIUM 100 MG PO CAPS
100.0000 mg | ORAL_CAPSULE | Freq: Two times a day (BID) | ORAL | Status: DC
  Administered 2013-05-05 – 2013-05-07 (×4): 100 mg via ORAL
  Filled 2013-05-05 (×5): qty 1

## 2013-05-05 MED ORDER — TRAMADOL HCL 50 MG PO TABS
50.0000 mg | ORAL_TABLET | Freq: Three times a day (TID) | ORAL | Status: DC | PRN
  Administered 2013-05-06: 50 mg via ORAL
  Filled 2013-05-05: qty 1

## 2013-05-05 MED ORDER — ALBUTEROL SULFATE HFA 108 (90 BASE) MCG/ACT IN AERS
2.0000 | INHALATION_SPRAY | RESPIRATORY_TRACT | Status: DC | PRN
  Filled 2013-05-05: qty 6.7

## 2013-05-05 MED ORDER — ALUM & MAG HYDROXIDE-SIMETH 200-200-20 MG/5ML PO SUSP: 30.0000 mL | Freq: Four times a day (QID) | ORAL | Status: DC | PRN

## 2013-05-05 MED ORDER — SODIUM CHLORIDE 0.9 % IJ SOLN: 3.0000 mL | Freq: Two times a day (BID) | INTRAMUSCULAR | Status: DC

## 2013-05-05 MED ORDER — SIMVASTATIN 10 MG PO TABS
10.0000 mg | ORAL_TABLET | Freq: Every evening | ORAL | Status: DC
Start: 2013-05-05 — End: 2013-05-07
  Administered 2013-05-05 – 2013-05-06 (×2): 10 mg via ORAL
  Filled 2013-05-05 (×3): qty 1

## 2013-05-05 MED ORDER — METOPROLOL TARTRATE 50 MG PO TABS
100.0000 mg | ORAL_TABLET | Freq: Two times a day (BID) | ORAL | Status: DC
  Administered 2013-05-05 – 2013-05-07 (×4): 100 mg via ORAL
  Filled 2013-05-05 (×5): qty 2

## 2013-05-05 MED ORDER — VITAMIN C 500 MG PO TABS
500.0000 mg | ORAL_TABLET | Freq: Every morning | ORAL | Status: DC
  Administered 2013-05-06 – 2013-05-07 (×2): 500 mg via ORAL
  Filled 2013-05-05 (×2): qty 1

## 2013-05-05 MED ORDER — PANTOPRAZOLE SODIUM 40 MG PO TBEC
40.0000 mg | DELAYED_RELEASE_TABLET | Freq: Every morning | ORAL | Status: DC
  Administered 2013-05-06 – 2013-05-07 (×2): 40 mg via ORAL
  Filled 2013-05-05 (×2): qty 1

## 2013-05-05 MED ORDER — NITROGLYCERIN 0.4 MG SL SUBL: 0.4000 mg | SUBLINGUAL_TABLET | SUBLINGUAL | Status: DC | PRN

## 2013-05-05 MED ORDER — PANTOPRAZOLE SODIUM 40 MG IV SOLR
40.0000 mg | Freq: Once | INTRAVENOUS | Status: AC
  Administered 2013-05-05: 40 mg via INTRAVENOUS
  Filled 2013-05-05: qty 40

## 2013-05-05 MED ORDER — DILTIAZEM HCL ER 180 MG PO CP24
180.0000 mg | ORAL_CAPSULE | Freq: Every morning | ORAL | Status: DC
  Administered 2013-05-06 – 2013-05-07 (×2): 180 mg via ORAL
  Filled 2013-05-05 (×2): qty 1

## 2013-05-05 MED ORDER — SODIUM CHLORIDE 0.9 % IV SOLN
1000.0000 mL | INTRAVENOUS | Status: DC
  Administered 2013-05-05 (×2): 1000 mL via INTRAVENOUS

## 2013-05-05 MED ORDER — POTASSIUM CHLORIDE IN NACL 20-0.9 MEQ/L-% IV SOLN
INTRAVENOUS | Status: DC
Start: 2013-05-05 — End: 2013-05-07
  Administered 2013-05-06 – 2013-05-07 (×4): via INTRAVENOUS
  Filled 2013-05-05 (×7): qty 1000

## 2013-05-05 MED ORDER — TAMSULOSIN HCL 0.4 MG PO CAPS
0.4000 mg | ORAL_CAPSULE | Freq: Every morning | ORAL | Status: DC
Start: 2013-05-06 — End: 2013-05-07
  Administered 2013-05-06 – 2013-05-07 (×2): 0.4 mg via ORAL
  Filled 2013-05-05 (×2): qty 1

## 2013-05-05 MED ORDER — ISOSORBIDE MONONITRATE ER 30 MG PO TB24
30.0000 mg | ORAL_TABLET | Freq: Every morning | ORAL | Status: DC
  Administered 2013-05-06 – 2013-05-07 (×2): 30 mg via ORAL
  Filled 2013-05-05 (×2): qty 1

## 2013-05-05 MED ORDER — CITALOPRAM HYDROBROMIDE 20 MG PO TABS
20.0000 mg | ORAL_TABLET | Freq: Every morning | ORAL | Status: DC
  Administered 2013-05-06 – 2013-05-07 (×2): 20 mg via ORAL
  Filled 2013-05-05 (×2): qty 1

## 2013-05-05 MED ORDER — POLYETHYLENE GLYCOL 3350 17 G PO PACK
17.0000 g | PACK | Freq: Every day | ORAL | Status: DC | PRN
  Filled 2013-05-05: qty 1

## 2013-05-05 MED ORDER — BOOST PO LIQD
237.0000 mL | Freq: Three times a day (TID) | ORAL | Status: DC
  Administered 2013-05-05 – 2013-05-07 (×4): 237 mL via ORAL
  Filled 2013-05-05 (×7): qty 237

## 2013-05-05 NOTE — ED Notes (Signed)
Pt sent here from [facility] sent here for low hgb and needed to come to ED.

## 2013-05-05 NOTE — ED Notes (Signed)
Pulses present-strong in upper extremities, weak in lower extremities

## 2013-05-05 NOTE — ED Provider Notes (Signed)
CSN: [id number]     Arrival date & time 05/05/13  1638 History   First MD Initiated Contact with Patient 05/05/13 1729     Chief Complaint  Patient presents with  . abnormal blood level    HPI Pt went to Dr [facility] office today for routine follow up on some gallbladder trouble he has been having.  He was seen two weeks ago for his gallbladder but since then he has been fine.  No vomiting, no diarrhea, no pain no blood in his stool.   No syncope.  No chest pain or abdominal pain.  He had blood tests done at the office and was told to come to the ED because his blood count was low.   Past Medical History  Diagnosis Date  . Asthma   . Hypertension   . Pulmonary embolism   . Peripheral artery disease   . Hypercholesteremia   . Bursitis of right shoulder   . DVT of leg (deep venous thrombosis)   . Cough   . Trouble swallowing   . Change in voice   . Difficulty urinating   . Easy bruising   . CAD (coronary artery disease) 10/02/2011    Severe 2 Vessel CAD with early Mid LAD 95% focal lesion followed by 60-70% irregular stenoses just after bifurcating SP1 and AV Groove Cx-OM1/OM2 bifurcation 90% lesion  . Stroke Feb. 2013  . Pneumonia Feb. 2013  . Myocardial infarction Sep 26, 2011  . Fall at home Sept. 2, 2014    Pt fell down 2 stairs   Past Surgical History  Procedure Laterality Date  . Hernia repair    . Appendectomy    . Femoral-femoral bypass graft    . Iliac artery stent    . Esophagogastroduodenoscopy  07/03/2011    Procedure: ESOPHAGOGASTRODUODENOSCOPY (EGD);  Surgeon: Shirley FriarVincent C. Schooler, MD;  [facility];  Service: Endoscopy;  Laterality: N/A;  . Compression hip screw  06/26/2011    Procedure: COMPRESSION HIP;  Surgeon: Raymon MuttonStephen D Lucey, MD;  Location: [facility];  Service: Orthopedics;  Laterality: Right;  . Laparoscopy  07/10/2011    Procedure: LAPAROSCOPY DIAGNOSTIC;  Surgeon: Ardeth SportsmanSteven C. Gross, MD;  Location: [facility];  Service: General;  Laterality: N/A;  serousal  repair,  . Esophagogastroduodenoscopy  09/28/2011    Procedure: ESOPHAGOGASTRODUODENOSCOPY (EGD);  Surgeon: Willis ModenaWilliam Outlaw, MD;  [facility];  Service: Endoscopy;  Laterality: N/A;  . Femoral-femoral bypass graft  10/05/2011    Procedure: BYPASS GRAFT FEMORAL-FEMORAL ARTERY;  Surgeon: Larina Earthlyodd F Early, MD;  [facility];  Service: Vascular;  Laterality: Bilateral;  Thrombectomy of Femoral-Femoral artery bypass graft  . Fracture surgery  06/24/11    Right Hip  . Joint replacement  06/24/11    Right Hip  . Small intestine surgery  March 2013  . Ercp N/A 04/20/2013    Procedure: ENDOSCOPIC RETROGRADE CHOLANGIOPANCREATOGRAPHY (ERCP);  Surgeon: Petra KubaMarc E Magod, MD;  Location: [facility];  Service: Endoscopy;  Laterality: N/A;   Family History  Problem Relation Age of Onset  . Hyperlipidemia Father   . Deep vein thrombosis Father    History  Substance Use Topics  . Smoking status: Former Smoker    Quit date: 08/20/1993  . Smokeless tobacco: Never Used  . Alcohol Use: No    Review of Systems  Constitutional: Negative for fever and fatigue.  Respiratory: Negative for shortness of breath.   Gastrointestinal: Negative for vomiting, diarrhea and blood in stool.  Skin: Positive for pallor.  Hematological: Does not bruise/bleed easily.  All other systems reviewed and are negative.    Allergies  Review of patient's allergies indicates no known allergies.  Home Medications   Current Outpatient Rx  Name  Route  Sig  Dispense  Refill  . aspirin EC 81 MG tablet   Oral   Take 81 mg by mouth every evening.          . citalopram (CELEXA) 20 MG tablet   Oral   Take 20 mg by mouth every morning.          . clopidogrel (PLAVIX) 75 MG tablet   Oral   Take 75 mg by mouth every morning.          . diltiazem (DILACOR XR) 180 MG 24 hr capsule   Oral   Take 180 mg by mouth every morning.          . isosorbide mononitrate (IMDUR) 30 MG 24 hr tablet   Oral   Take 30 mg by mouth  every morning.          . lactose free nutrition (BOOST) LIQD   Oral   Take 237 mLs by mouth 3 (three) times daily. Chocolate.         . metoprolol (LOPRESSOR) 100 MG tablet   Oral   Take 100 mg by mouth 2 (two) times daily.         . Multiple Vitamin (MULTIVITAMIN WITH MINERALS) TABS tablet   Oral   Take 1 tablet by mouth every morning.          . nitroGLYCERIN (NITROSTAT) 0.4 MG SL tablet   Sublingual   Place 0.4 mg under the tongue every 5 (five) minutes as needed for chest pain.         . pantoprazole (PROTONIX) 40 MG tablet   Oral   Take 40 mg by mouth every morning.          . polyethylene glycol (MIRALAX / GLYCOLAX) packet   Oral   Take 17 g by mouth daily as needed for moderate constipation.         . simvastatin (ZOCOR) 10 MG tablet   Oral   Take 10 mg by mouth every evening.          . Tamsulosin HCl (FLOMAX) 0.4 MG CAPS   Oral   Take 0.4 mg by mouth every morning.          . VENTOLIN HFA 108 (90 BASE) MCG/ACT inhaler   Inhalation   Inhale 2 puffs into the lungs every 4 (four) hours as needed for wheezing or shortness of breath.          . vitamin C (ASCORBIC ACID) 500 MG tablet   Oral   Take 500 mg by mouth every morning.          . zinc sulfate 220 MG capsule   Oral   Take 220 mg by mouth every morning.          BP 106/88  Pulse 149  Temp(Src) 97.6 F (36.4 C) (Oral)  Resp 22  SpO2 100% Physical Exam  Nursing note and vitals reviewed. Constitutional: He appears well-developed and well-nourished. No distress.  HENT:  Head: Normocephalic and atraumatic.  Right Ear: External ear normal.  Left Ear: External ear normal.  Eyes: Conjunctivae are normal. Right eye exhibits no discharge. Left eye exhibits no discharge. No scleral icterus.  Neck: Neck supple. No tracheal deviation present.  Cardiovascular: Normal rate,  regular rhythm and intact distal pulses.   Pulmonary/Chest: Effort normal and breath sounds normal. No stridor.  No respiratory distress. He has no wheezes. He has no rales.  Abdominal: Soft. Bowel sounds are normal. He exhibits no distension. There is no tenderness. There is no rebound and no guarding.  Genitourinary: Rectal exam shows no mass.  No gross blood in the stool  Musculoskeletal: He exhibits no edema and no tenderness.  Neurological: He is alert. He has normal strength. No sensory deficit. Cranial nerve deficit:  no gross defecits noted. He exhibits normal muscle tone. He displays no seizure activity. Coordination normal.  Skin: Skin is warm and dry. No rash noted. There is pallor.  Psychiatric: He has a normal mood and affect.    ED Course  Procedures (including critical care time) 1905  Checked on patient.  Still in no distress.  HR is in the 70s.  Suspect documented hr of the 140s is erroneous.  Discussed findings.  Plan on blood transfusion.  Pt agrees with transfusion. Discussed with Dr Randa Evens.  Will consult on pt (GI).  CRITICAL CARE Performed by: Celene Kras Total critical care time: 40 Critical care time was exclusive of separately billable procedures and treating other patients. Critical care was necessary to treat or prevent imminent or life-threatening deterioration. Critical care was time spent personally by me on the following activities: development of treatment plan with patient and/or surrogate as well as nursing, discussions with consultants, evaluation of patient's response to treatment, examination of patient, obtaining history from patient or surrogate, ordering and performing treatments and interventions, ordering and review of laboratory studies, ordering and review of radiographic studies, pulse oximetry and re-evaluation of patient's condition.   Labs Review Labs Reviewed  COMPREHENSIVE METABOLIC PANEL - Abnormal; Notable for the following:    Glucose, Bld 123 (*)    Albumin 2.7 (*)    AST 59 (*)    ALT 70 (*)    Alkaline Phosphatase 492 (*)    Total Bilirubin  1.7 (*)    GFR calc non Af Amer 56 (*)    GFR calc Af Amer 65 (*)    All other components within normal limits  CBC WITH DIFFERENTIAL - Abnormal; Notable for the following:    WBC 3.8 (*)    RBC 2.44 (*)    Hemoglobin 6.7 (*)    HCT 22.5 (*)    MCHC 29.8 (*)    RDW 23.2 (*)    All other components within normal limits  OCCULT BLOOD, POC DEVICE - Abnormal; Notable for the following:    Fecal Occult Bld POSITIVE (*)    All other components within normal limits  APTT  PROTIME-INR  TYPE AND SCREEN  PREPARE RBC (CROSSMATCH)   Imaging Review No results found.  EKG Interpretation   None      Medications  0.9 %  sodium chloride infusion (1,000 mLs Intravenous New Bag/Given 05/05/13 1839)  pantoprazole (PROTONIX) injection 40 mg (not administered)    MDM   1. GI bleed   2. Acute blood loss anemia    Pt remains stable.  Plan on admission for further evaluation, monitoring and blood transfusion.    Celene Kras, MD 05/05/13 [phone]

## 2013-05-05 NOTE — Progress Notes (Signed)
   CARE MANAGEMENT ED NOTE 05/05/2013  Patient:  [name], [name]   Account Number:  [id number]  Date Initiated:  05/05/2013  Documentation initiated by:  Radford Pax  Subjective/Objective Assessment:   Patient presents to Ed with low hemoglobin.     Subjective/Objective Assessment Detail:     Action/Plan:   Action/Plan Detail:   Anticipated DC Date:       Status Recommendation to Physician:   Result of Recommendation:    Other ED Services  Consult Working Plan    DC Planning Services  CM consult  Other    Choice offered to / List presented to:            Status of service:  Completed, signed off  ED Comments:   ED Comments Detail:  EDCM spoke to patient at bedside.  Patient reports he currently has physical therapy with [facility].  Patient has no other services but PT with [facility].  Patient reports he lives at home with his wife.  as per patient, his wife helps him with his meals, dress and wash him.  Patient has a walker, shower chair, wheelchair and a stair lift at home.  Patient reports he was in the hospital two weeks ago.  EDCM placed text to Belenda Cruise, transition specialist with [facility] to make her aware of patient admission.  No further CM needs at this time.

## 2013-05-05 NOTE — ED Notes (Signed)
Informed MD/RN of abnormal HgB

## 2013-05-06 ENCOUNTER — Encounter (HOSPITAL_COMMUNITY): Payer: Self-pay | Admitting: Gastroenterology

## 2013-05-06 ENCOUNTER — Encounter (HOSPITAL_COMMUNITY)
Admission: EM | Disposition: A | Discharge status: Home Health | Payer: Self-pay | Source: Home / Self Care | Attending: Family Medicine

## 2013-05-06 DIAGNOSIS — K922 Gastrointestinal hemorrhage, unspecified: Secondary | ICD-10-CM | POA: Diagnosis present

## 2013-05-06 DIAGNOSIS — D62 Acute posthemorrhagic anemia: Secondary | ICD-10-CM

## 2013-05-06 HISTORY — PX: ESOPHAGOGASTRODUODENOSCOPY: SHX5428

## 2013-05-06 LAB — COMPREHENSIVE METABOLIC PANEL
ALK PHOS: 419 U/L — AB (ref 39–117)
ALT: 59 U/L — ABNORMAL HIGH (ref 0–53)
AST: 54 U/L — AB (ref 0–37)
Albumin: 2.3 g/dL — ABNORMAL LOW (ref 3.5–5.2)
BILIRUBIN TOTAL: 2 mg/dL — AB (ref 0.3–1.2)
BUN: 14 mg/dL (ref 6–23)
CALCIUM: 8.4 mg/dL (ref 8.4–10.5)
CHLORIDE: 106 meq/L (ref 96–112)
CO2: 25 meq/L (ref 19–32)
Creatinine, Ser: 1.03 mg/dL (ref 0.50–1.35)
GFR, EST AFRICAN AMERICAN: 79 mL/min — AB (ref >90)
GFR, EST NON AFRICAN AMERICAN: 68 mL/min — AB (ref >90)
GLUCOSE: 88 mg/dL (ref 70–99)
Potassium: 4.3 mEq/L (ref 3.7–5.3)
Sodium: 140 mEq/L (ref 137–147)
Total Protein: 6.1 g/dL (ref 6.0–8.3)

## 2013-05-06 LAB — CBC
HCT: 28.2 % — ABNORMAL LOW (ref 39.0–52.0)
Hemoglobin: 9.2 g/dL — ABNORMAL LOW (ref 13.0–17.0)
MCH: 28.4 pg (ref 26.0–34.0)
MCHC: 32.6 g/dL (ref 30.0–36.0)
MCV: 87 fL (ref 78.0–100.0)
PLATELETS: 248 10*3/uL (ref 150–400)
RBC: 3.24 MIL/uL — ABNORMAL LOW (ref 4.22–5.81)
RDW: 19.3 % — ABNORMAL HIGH (ref 11.5–15.5)
WBC: 3.5 10*3/uL — ABNORMAL LOW (ref 4.0–10.5)

## 2013-05-06 LAB — HEMOGLOBIN A1C
Hgb A1c MFr Bld: 5 % (ref <5.7)
Mean Plasma Glucose: 97 mg/dL (ref <117)

## 2013-05-06 LAB — PHOSPHORUS: PHOSPHORUS: 3.2 mg/dL (ref 2.3–4.6)

## 2013-05-06 LAB — CALCIUM: Calcium: 8.3 mg/dL — ABNORMAL LOW (ref 8.4–10.5)

## 2013-05-06 LAB — TSH: TSH: 1.431 u[IU]/mL (ref 0.350–4.500)

## 2013-05-06 LAB — MAGNESIUM: Magnesium: 1.8 mg/dL (ref 1.5–2.5)

## 2013-05-06 SURGERY — EGD (ESOPHAGOGASTRODUODENOSCOPY)
Anesthesia: Moderate Sedation

## 2013-05-06 MED ORDER — FENTANYL CITRATE 0.05 MG/ML IJ SOLN
INTRAMUSCULAR | Status: AC
  Filled 2013-05-06: qty 2

## 2013-05-06 MED ORDER — FENTANYL CITRATE 0.05 MG/ML IJ SOLN
INTRAMUSCULAR | Status: DC | PRN
  Administered 2013-05-06: 25 ug via INTRAVENOUS

## 2013-05-06 MED ORDER — MIDAZOLAM HCL 10 MG/2ML IJ SOLN
INTRAMUSCULAR | Status: DC | PRN
  Administered 2013-05-06: 12:00:00 2 mg via INTRAVENOUS

## 2013-05-06 MED ORDER — BUTAMBEN-TETRACAINE-BENZOCAINE 2-2-14 % EX AERO
INHALATION_SPRAY | CUTANEOUS | Status: DC | PRN
  Administered 2013-05-06: 12:00:00 2 via TOPICAL

## 2013-05-06 MED ORDER — MIDAZOLAM HCL 10 MG/2ML IJ SOLN
INTRAMUSCULAR | Status: AC
  Filled 2013-05-06: qty 2

## 2013-05-06 NOTE — H&P (View-Only) (Signed)
[facility] Reason for consult: Referring Physician:   Carlyle Mcelrath is an 80 y.o. male.  HPI: 80 year old who was admitted about 3 weeks ago abdominal pain and elevated liver tests. He underwent ERCP, sphincterotomy, and stone extraction by Dr Watt Climes. Since that procedure is painless resolve completely in his liver tests are steadily improved. The patient was feeling weak and presented to Dr Delfina Redwood with weakness and was found to have a hemoglobin of 6.7. His stools were positive. He was feeling quite weak and noted black stools for the past week. Dr. Watt Climes performs colonoscopies in regular basis. He reports it is less colonoscopy was normal and that is due for another colonoscopy 2016. He's had a history of pulmonary emboli and DVT in the past and has been treated with Coumadin the past. He also has had a history of strokes. He states that he is not been on Coumadin for some time. His other problems include peripheral vascular disease with previous bypass surgery several bone fractures. Hernia repair iliac artery stents and appendectomy. He has a history of CAD and has had previous myocardial infarction's. He is on chronic Plavix. The patient reports that since going home he has been eating very well much better than he has in months and has had absolutely no abdominal pain whatsoever. His only complaint has been weakness and black stools.  Past Medical History  Diagnosis Date  . Asthma   . Hypertension   . Pulmonary embolism   . Peripheral artery disease   . Hypercholesteremia   . Bursitis of right shoulder   . DVT of leg (deep venous thrombosis)   . Cough   . Trouble swallowing   . Change in voice   . Difficulty urinating   . Easy bruising   . CAD (coronary artery disease) 10/02/2011    Severe 2 Vessel CAD with early Mid LAD 95% focal lesion followed by 60-70% irregular stenoses just after bifurcating SP1 and AV Groove Cx-OM1/OM2 bifurcation 90% lesion  . Stroke Feb. 2013   . Pneumonia Feb. 2013  . Myocardial infarction Sep 26, 2011  . Fall at home Sept. 2, 2014    Pt fell down 2 stairs    Past Surgical History  Procedure Laterality Date  . Hernia repair    . Appendectomy    . Femoral-femoral bypass graft    . Iliac artery stent    . Esophagogastroduodenoscopy  07/03/2011    Procedure: ESOPHAGOGASTRODUODENOSCOPY (EGD);  Surgeon: Lear Ng, MD;  Location: [facility];  Service: Endoscopy;  Laterality: N/A;  . Compression hip screw  06/26/2011    Procedure: COMPRESSION HIP;  Surgeon: Rudean Haskell, MD;  Location: [facility];  Service: Orthopedics;  Laterality: Right;  . Laparoscopy  07/10/2011    Procedure: LAPAROSCOPY DIAGNOSTIC;  Surgeon: Adin Hector, MD;  Location: [facility];  Service: General;  Laterality: N/A;  serousal repair,  . Esophagogastroduodenoscopy  09/28/2011    Procedure: ESOPHAGOGASTRODUODENOSCOPY (EGD);  Surgeon: Arta Silence, MD;  Location: [facility];  Service: Endoscopy;  Laterality: N/A;  . Femoral-femoral bypass graft  10/05/2011    Procedure: BYPASS GRAFT FEMORAL-FEMORAL ARTERY;  Surgeon: Rosetta Posner, MD;  Location: [facility] OR;  Service: Vascular;  Laterality: Bilateral;  Thrombectomy of Femoral-Femoral artery bypass graft  . Fracture surgery  06/24/11    Right Hip  . Joint replacement  06/24/11    Right Hip  . Small intestine surgery  March 2013  . Ercp N/A 04/20/2013  Procedure: ENDOSCOPIC RETROGRADE CHOLANGIOPANCREATOGRAPHY (ERCP);  Surgeon: Jeryl Columbia, MD;  Location: [facility];  Service: Endoscopy;  Laterality: N/A;    Family History  Problem Relation Age of Onset  . Hyperlipidemia Father   . Deep vein thrombosis Father     Social History:  reports that he quit smoking about 19 years ago. He has never used smokeless tobacco. He reports that he does not drink alcohol or use illicit drugs.  Allergies: No Known Allergies  Medications; Prior to Admission medications   Medication Sig Start Date End Date Taking?  Authorizing Provider  aspirin EC 81 MG tablet Take 81 mg by mouth every evening.    Yes Historical Provider, MD  citalopram (CELEXA) 20 MG tablet Take 20 mg by mouth every morning.  07/27/12  Yes Historical Provider, MD  clopidogrel (PLAVIX) 75 MG tablet Take 75 mg by mouth every morning.    Yes Historical Provider, MD  diltiazem (DILACOR XR) 180 MG 24 hr capsule Take 180 mg by mouth every morning.    Yes Historical Provider, MD  isosorbide mononitrate (IMDUR) 30 MG 24 hr tablet Take 30 mg by mouth every morning.    Yes Historical Provider, MD  lactose free nutrition (BOOST) LIQD Take 237 mLs by mouth 3 (three) times daily. Chocolate.   Yes Historical Provider, MD  metoprolol (LOPRESSOR) 100 MG tablet Take 100 mg by mouth 2 (two) times daily.   Yes Historical Provider, MD  Multiple Vitamin (MULTIVITAMIN WITH MINERALS) TABS tablet Take 1 tablet by mouth every morning.    Yes Historical Provider, MD  nitroGLYCERIN (NITROSTAT) 0.4 MG SL tablet Place 0.4 mg under the tongue every 5 (five) minutes as needed for chest pain.   Yes Historical Provider, MD  pantoprazole (PROTONIX) 40 MG tablet Take 40 mg by mouth every morning.    Yes Historical Provider, MD  polyethylene glycol (MIRALAX / GLYCOLAX) packet Take 17 g by mouth daily as needed for moderate constipation.   Yes Historical Provider, MD  simvastatin (ZOCOR) 10 MG tablet Take 10 mg by mouth every evening.    Yes Historical Provider, MD  Tamsulosin HCl (FLOMAX) 0.4 MG CAPS Take 0.4 mg by mouth every morning.    Yes Historical Provider, MD  VENTOLIN HFA 108 (90 BASE) MCG/ACT inhaler Inhale 2 puffs into the lungs every 4 (four) hours as needed for wheezing or shortness of breath.  01/16/13  Yes Historical Provider, MD  vitamin C (ASCORBIC ACID) 500 MG tablet Take 500 mg by mouth every morning.    Yes Historical Provider, MD  zinc sulfate 220 MG capsule Take 220 mg by mouth every morning.   Yes Historical Provider, MD   . citalopram  20 mg Oral q morning  - 10a  . diltiazem  180 mg Oral q morning - 10a  . docusate sodium  100 mg Oral BID  . isosorbide mononitrate  30 mg Oral q morning - 10a  . lactose free nutrition  237 mL Oral TID  . metoprolol  100 mg Oral BID  . pantoprazole  40 mg Oral q morning - 10a  . simvastatin  10 mg Oral QPM  . sodium chloride  3 mL Intravenous Q12H  . sodium chloride  3 mL Intravenous Q12H  . tamsulosin  0.4 mg Oral q morning - 10a  . vitamin C  500 mg Oral q morning - 10a   PRN Meds albuterol, alum & mag hydroxide-simeth, nitroGLYCERIN, polyethylene glycol, traMADol Results for orders placed during the  hospital encounter of 05/05/13 (from the past 48 hour(s))  COMPREHENSIVE METABOLIC PANEL     Status: Abnormal   Collection Time    05/05/13  5:45 PM      Result Value Range   Sodium 139  137 - 147 mEq/L   Comment: Please note change in reference range.   Potassium 4.5  3.7 - 5.3 mEq/L   Comment: Please note change in reference range.   Chloride 104  96 - 112 mEq/L   CO2 23  19 - 32 mEq/L   Glucose, Bld 123 (*) 70 - 99 mg/dL   BUN 22  6 - 23 mg/dL   Creatinine, Ser 1.21  0.50 - 1.35 mg/dL   Calcium 8.8  8.4 - 10.5 mg/dL   Total Protein 7.1  6.0 - 8.3 g/dL   Albumin 2.7 (*) 3.5 - 5.2 g/dL   AST 59 (*) 0 - 37 U/L   ALT 70 (*) 0 - 53 U/L   Alkaline Phosphatase 492 (*) 39 - 117 U/L   Total Bilirubin 1.7 (*) 0.3 - 1.2 mg/dL   GFR calc non Af Amer 56 (*) >90 mL/min   GFR calc Af Amer 65 (*) >90 mL/min   Comment: (NOTE)     The eGFR has been calculated using the CKD EPI equation.     This calculation has not been validated in all clinical situations.     eGFR's persistently <90 mL/min signify possible Chronic Kidney     Disease.  APTT     Status: None   Collection Time    05/05/13  5:45 PM      Result Value Range   aPTT 27  24 - 37 seconds  PROTIME-INR     Status: None   Collection Time    05/05/13  5:45 PM      Result Value Range   Prothrombin Time 13.9  11.6 - 15.2 seconds   INR 1.09  0.00 -  1.49  TYPE AND SCREEN     Status: None   Collection Time    05/05/13  5:45 PM      Result Value Range   ABO/RH(D) A POS     Antibody Screen NEG     Sample Expiration 05/08/2013     Unit Number [id number]     Blood Component Type RED CELLS,LR     Unit division 00     Status of Unit ISSUED     Transfusion Status OK TO TRANSFUSE     Crossmatch Result Compatible     Unit Number [id number]     Blood Component Type RED CELLS,LR     Unit division 00     Status of Unit ISSUED     Transfusion Status OK TO TRANSFUSE     Crossmatch Result Compatible    CBC WITH DIFFERENTIAL     Status: Abnormal   Collection Time    05/05/13  5:45 PM      Result Value Range   WBC 3.8 (*) 4.0 - 10.5 K/uL   RBC 2.44 (*) 4.22 - 5.81 MIL/uL   Hemoglobin 6.7 (*) 13.0 - 17.0 g/dL   Comment: REPEATED TO VERIFY     CRITICAL RESULT CALLED TO, READ BACK BY AND VERIFIED WITH:     S.WEST RN AT 1834 ON [id number] BY C.BONGEL   HCT 22.5 (*) 39.0 - 52.0 %   MCV 92.2  78.0 - 100.0 fL   MCH 27.5  26.0 - 34.0 pg  MCHC 29.8 (*) 30.0 - 36.0 g/dL   RDW 23.2 (*) 11.5 - 15.5 %   Platelets 313  150 - 400 K/uL   Neutrophils Relative % 62  43 - 77 %   Neutro Abs 2.4  1.7 - 7.7 K/uL   Lymphocytes Relative 25  12 - 46 %   Lymphs Abs 1.0  0.7 - 4.0 K/uL   Monocytes Relative 9  3 - 12 %   Monocytes Absolute 0.4  0.1 - 1.0 K/uL   Eosinophils Relative 3  0 - 5 %   Eosinophils Absolute 0.1  0.0 - 0.7 K/uL   Basophils Relative 0  0 - 1 %   Basophils Absolute 0.0  0.0 - 0.1 K/uL  OCCULT BLOOD, POC DEVICE     Status: Abnormal   Collection Time    05/05/13  6:12 PM      Result Value Range   Fecal Occult Bld POSITIVE (*) NEGATIVE  PREPARE RBC (CROSSMATCH)     Status: None   Collection Time    05/05/13  6:55 PM      Result Value Range   Order Confirmation ORDER PROCESSED BY BLOOD BANK    CALCIUM     Status: Abnormal   Collection Time    05/05/13 11:13 PM      Result Value Range   Calcium 8.3 (*) 8.4 - 10.5 mg/dL   MAGNESIUM     Status: None   Collection Time    05/05/13 11:13 PM      Result Value Range   Magnesium 1.8  1.5 - 2.5 mg/dL  PHOSPHORUS     Status: None   Collection Time    05/05/13 11:13 PM      Result Value Range   Phosphorus 3.2  2.3 - 4.6 mg/dL  TSH     Status: None   Collection Time    05/05/13 11:13 PM      Result Value Range   TSH 1.431  0.350 - 4.500 uIU/mL   Comment: Performed at [facility]     Status: None   Collection Time    05/05/13 11:13 PM      Result Value Range   Hemoglobin A1C 5.0  <5.7 %   Comment: (NOTE)                                                                               According to the ADA Clinical Practice Recommendations for 2011, when     HbA1c is used as a screening test:      >=6.5%   Diagnostic of Diabetes Mellitus               (if abnormal result is confirmed)     5.7-6.4%   Increased risk of developing Diabetes Mellitus     References:Diagnosis and Classification of Diabetes Mellitus,Diabetes     UXNA,3557,32(KGURK 1):S62-S69 and Standards of Medical Care in             Diabetes - 2011,Diabetes Care,2011,34 (Suppl 1):S11-S61.   Mean Plasma Glucose 97  <117 mg/dL   Comment: Performed at [facility] (CROSSMATCH)  Status: None   Collection Time    05/05/13 11:30 PM      Result Value Range   Order Confirmation ORDER PROCESSED BY BLOOD BANK      No results found.             Blood pressure 124/61, pulse 80, temperature 98.3 F (36.8 C), temperature source Oral, resp. rate 20, height _0  (1.702 m), weight 54.7 kg (120 lb 9.5 oz), SpO2 98.00%.  Physical exam:   General-- African-American male sitting in bed watching television Heart-- regular rate and rhythm without murmurs are gallops Lungs--clear Abdomen-- soft and completely nontender   Assessment: 1. G.I. bleeding/anemia. This is probably due to ulceration irritation at sphincterotomy site in the face of chronic  Plavix. The patient notes that he had black school for a week or so and now is cleared up somewhat that is stools are still positive. ERCP scope does not allow a thorough examination of the stomach and duodenum. And it is possible that he could have ulceration or some other lesion of the stomach or duodenal bulb. 2. Elevated liver tests. Markedly improved after ERCP and sphincterotomy. Patient is completely asymptomatic and is eating well without any symptoms. 3. History of CAD, peripheral vascular disease, cerebral vascular disease. Patient is on chronic Plavix  Plan: 1. We will keep him NPO for now and proceed with EGD later this morning. If he is having active bleeding at sphincterotomy site we can possibly do epinephrine injections. I have discussed this with the patient is agreeable.   Quetzali Heinle JR,Nyiah Pianka L 05/06/2013, 8:19 AM

## 2013-05-06 NOTE — Progress Notes (Signed)
05/06/2012 1100 Active with [facility] for HHPT. Pt may need HH RN at dc. NCM will continue to follow for dc needs.  Isidoro Donning RN CCM Case Mgmt phone [phone]

## 2013-05-06 NOTE — Consult Note (Signed)
[facility] CONSULT Reason for consult: Referring Physician:   [name] is an 80 y.o. male.  HPI: 80 year old who was admitted about 3 weeks ago abdominal pain and elevated liver tests. He underwent ERCP, sphincterotomy, and stone extraction by Dr Magod. Since that procedure is painless resolve completely in his liver tests are steadily improved. The patient was feeling weak and presented to Dr Polite with weakness and was found to have a hemoglobin of 6.7. His stools were positive. He was feeling quite weak and noted black stools for the past week. Dr. Magod performs colonoscopies in regular basis. He reports it is less colonoscopy was normal and that is due for another colonoscopy 2016. He's had a history of pulmonary emboli and DVT in the past and has been treated with Coumadin the past. He also has had a history of strokes. He states that he is not been on Coumadin for some time. His other problems include peripheral vascular disease with previous bypass surgery several bone fractures. Hernia repair iliac artery stents and appendectomy. He has a history of CAD and has had previous myocardial infarction's. He is on chronic Plavix. The patient reports that since going home he has been eating very well much better than he has in months and has had absolutely no abdominal pain whatsoever. His only complaint has been weakness and black stools.  Past Medical History  Diagnosis Date  . Asthma   . Hypertension   . Pulmonary embolism   . Peripheral artery disease   . Hypercholesteremia   . Bursitis of right shoulder   . DVT of leg (deep venous thrombosis)   . Cough   . Trouble swallowing   . Change in voice   . Difficulty urinating   . Easy bruising   . CAD (coronary artery disease) 10/02/2011    Severe 2 Vessel CAD with early Mid LAD 95% focal lesion followed by 60-70% irregular stenoses just after bifurcating SP1 and AV Groove Cx-OM1/OM2 bifurcation 90% lesion  . Stroke Feb. 2013   . Pneumonia Feb. 2013  . Myocardial infarction Sep 26, 2011  . Fall at home Sept. 2, 2014    Pt fell down 2 stairs    Past Surgical History  Procedure Laterality Date  . Hernia repair    . Appendectomy    . Femoral-femoral bypass graft    . Iliac artery stent    . Esophagogastroduodenoscopy  07/03/2011    Procedure: ESOPHAGOGASTRODUODENOSCOPY (EGD);  Surgeon: Vincent C. Schooler, MD;  Location: [facility];  Service: Endoscopy;  Laterality: N/A;  . Compression hip screw  06/26/2011    Procedure: COMPRESSION HIP;  Surgeon: Stephen D Lucey, MD;  Location: [facility];  Service: Orthopedics;  Laterality: Right;  . Laparoscopy  07/10/2011    Procedure: LAPAROSCOPY DIAGNOSTIC;  Surgeon: Steven C. Gross, MD;  Location: [facility];  Service: General;  Laterality: N/A;  serousal repair,  . Esophagogastroduodenoscopy  09/28/2011    Procedure: ESOPHAGOGASTRODUODENOSCOPY (EGD);  Surgeon: William Outlaw, MD;  Location: [facility];  Service: Endoscopy;  Laterality: N/A;  . Femoral-femoral bypass graft  10/05/2011    Procedure: BYPASS GRAFT FEMORAL-FEMORAL ARTERY;  Surgeon: Todd F Early, MD;  Location: [facility];  Service: Vascular;  Laterality: Bilateral;  Thrombectomy of Femoral-Femoral artery bypass graft  . Fracture surgery  06/24/11    Right Hip  . Joint replacement  06/24/11    Right Hip  . Small intestine surgery  March 2013  . Ercp N/A 04/20/2013      Procedure: ENDOSCOPIC RETROGRADE CHOLANGIOPANCREATOGRAPHY (ERCP);  Surgeon: Marc E Magod, MD;  Location: [facility];  Service: Endoscopy;  Laterality: N/A;    Family History  Problem Relation Age of Onset  . Hyperlipidemia Father   . Deep vein thrombosis Father     Social History:  reports that he quit smoking about 19 years ago. He has never used smokeless tobacco. He reports that he does not drink alcohol or use illicit drugs.  Allergies: No Known Allergies  Medications; Prior to Admission medications   Medication Sig Start Date End Date Taking?  Authorizing Provider  aspirin EC 81 MG tablet Take 81 mg by mouth every evening.    Yes Historical Provider, MD  citalopram (CELEXA) 20 MG tablet Take 20 mg by mouth every morning.  07/27/12  Yes Historical Provider, MD  clopidogrel (PLAVIX) 75 MG tablet Take 75 mg by mouth every morning.    Yes Historical Provider, MD  diltiazem (DILACOR XR) 180 MG 24 hr capsule Take 180 mg by mouth every morning.    Yes Historical Provider, MD  isosorbide mononitrate (IMDUR) 30 MG 24 hr tablet Take 30 mg by mouth every morning.    Yes Historical Provider, MD  lactose free nutrition (BOOST) LIQD Take 237 mLs by mouth 3 (three) times daily. Chocolate.   Yes Historical Provider, MD  metoprolol (LOPRESSOR) 100 MG tablet Take 100 mg by mouth 2 (two) times daily.   Yes Historical Provider, MD  Multiple Vitamin (MULTIVITAMIN WITH MINERALS) TABS tablet Take 1 tablet by mouth every morning.    Yes Historical Provider, MD  nitroGLYCERIN (NITROSTAT) 0.4 MG SL tablet Place 0.4 mg under the tongue every 5 (five) minutes as needed for chest pain.   Yes Historical Provider, MD  pantoprazole (PROTONIX) 40 MG tablet Take 40 mg by mouth every morning.    Yes Historical Provider, MD  polyethylene glycol (MIRALAX / GLYCOLAX) packet Take 17 g by mouth daily as needed for moderate constipation.   Yes Historical Provider, MD  simvastatin (ZOCOR) 10 MG tablet Take 10 mg by mouth every evening.    Yes Historical Provider, MD  Tamsulosin HCl (FLOMAX) 0.4 MG CAPS Take 0.4 mg by mouth every morning.    Yes Historical Provider, MD  VENTOLIN HFA 108 (90 BASE) MCG/ACT inhaler Inhale 2 puffs into the lungs every 4 (four) hours as needed for wheezing or shortness of breath.  01/16/13  Yes Historical Provider, MD  vitamin C (ASCORBIC ACID) 500 MG tablet Take 500 mg by mouth every morning.    Yes Historical Provider, MD  zinc sulfate 220 MG capsule Take 220 mg by mouth every morning.   Yes Historical Provider, MD   . citalopram  20 mg Oral q morning  - 10a  . diltiazem  180 mg Oral q morning - 10a  . docusate sodium  100 mg Oral BID  . isosorbide mononitrate  30 mg Oral q morning - 10a  . lactose free nutrition  237 mL Oral TID  . metoprolol  100 mg Oral BID  . pantoprazole  40 mg Oral q morning - 10a  . simvastatin  10 mg Oral QPM  . sodium chloride  3 mL Intravenous Q12H  . sodium chloride  3 mL Intravenous Q12H  . tamsulosin  0.4 mg Oral q morning - 10a  . vitamin C  500 mg Oral q morning - 10a   PRN Meds albuterol, alum & mag hydroxide-simeth, nitroGLYCERIN, polyethylene glycol, traMADol Results for orders placed during the   hospital encounter of 05/05/13 (from the past 48 hour(s))  COMPREHENSIVE METABOLIC PANEL     Status: Abnormal   Collection Time    05/05/13  5:45 PM      Result Value Range   Sodium 139  137 - 147 mEq/L   Comment: Please note change in reference range.   Potassium 4.5  3.7 - 5.3 mEq/L   Comment: Please note change in reference range.   Chloride 104  96 - 112 mEq/L   CO2 23  19 - 32 mEq/L   Glucose, Bld 123 (*) 70 - 99 mg/dL   BUN 22  6 - 23 mg/dL   Creatinine, Ser 1.21  0.50 - 1.35 mg/dL   Calcium 8.8  8.4 - 10.5 mg/dL   Total Protein 7.1  6.0 - 8.3 g/dL   Albumin 2.7 (*) 3.5 - 5.2 g/dL   AST 59 (*) 0 - 37 U/L   ALT 70 (*) 0 - 53 U/L   Alkaline Phosphatase 492 (*) 39 - 117 U/L   Total Bilirubin 1.7 (*) 0.3 - 1.2 mg/dL   GFR calc non Af Amer 56 (*) >90 mL/min   GFR calc Af Amer 65 (*) >90 mL/min   Comment: (NOTE)     The eGFR has been calculated using the CKD EPI equation.     This calculation has not been validated in all clinical situations.     eGFR's persistently <90 mL/min signify possible Chronic Kidney     Disease.  APTT     Status: None   Collection Time    05/05/13  5:45 PM      Result Value Range   aPTT 27  24 - 37 seconds  PROTIME-INR     Status: None   Collection Time    05/05/13  5:45 PM      Result Value Range   Prothrombin Time 13.9  11.6 - 15.2 seconds   INR 1.09  0.00 -  1.49  TYPE AND SCREEN     Status: None   Collection Time    05/05/13  5:45 PM      Result Value Range   ABO/RH(D) A POS     Antibody Screen NEG     Sample Expiration 05/08/2013     Unit Number [id number]     Blood Component Type RED CELLS,LR     Unit division 00     Status of Unit ISSUED     Transfusion Status OK TO TRANSFUSE     Crossmatch Result Compatible     Unit Number [id number]     Blood Component Type RED CELLS,LR     Unit division 00     Status of Unit ISSUED     Transfusion Status OK TO TRANSFUSE     Crossmatch Result Compatible    CBC WITH DIFFERENTIAL     Status: Abnormal   Collection Time    05/05/13  5:45 PM      Result Value Range   WBC 3.8 (*) 4.0 - 10.5 K/uL   RBC 2.44 (*) 4.22 - 5.81 MIL/uL   Hemoglobin 6.7 (*) 13.0 - 17.0 g/dL   Comment: REPEATED TO VERIFY     CRITICAL RESULT CALLED TO, READ BACK BY AND VERIFIED WITH:     S.WEST RN AT 1834 ON [id number] BY C.BONGEL   HCT 22.5 (*) 39.0 - 52.0 %   MCV 92.2  78.0 - 100.0 fL   MCH 27.5  26.0 - 34.0 pg     MCHC 29.8 (*) 30.0 - 36.0 g/dL   RDW 23.2 (*) 11.5 - 15.5 %   Platelets 313  150 - 400 K/uL   Neutrophils Relative % 62  43 - 77 %   Neutro Abs 2.4  1.7 - 7.7 K/uL   Lymphocytes Relative 25  12 - 46 %   Lymphs Abs 1.0  0.7 - 4.0 K/uL   Monocytes Relative 9  3 - 12 %   Monocytes Absolute 0.4  0.1 - 1.0 K/uL   Eosinophils Relative 3  0 - 5 %   Eosinophils Absolute 0.1  0.0 - 0.7 K/uL   Basophils Relative 0  0 - 1 %   Basophils Absolute 0.0  0.0 - 0.1 K/uL  OCCULT BLOOD, POC DEVICE     Status: Abnormal   Collection Time    05/05/13  6:12 PM      Result Value Range   Fecal Occult Bld POSITIVE (*) NEGATIVE  PREPARE RBC (CROSSMATCH)     Status: None   Collection Time    05/05/13  6:55 PM      Result Value Range   Order Confirmation ORDER PROCESSED BY BLOOD BANK    CALCIUM     Status: Abnormal   Collection Time    05/05/13 11:13 PM      Result Value Range   Calcium 8.3 (*) 8.4 - 10.5 mg/dL   MAGNESIUM     Status: None   Collection Time    05/05/13 11:13 PM      Result Value Range   Magnesium 1.8  1.5 - 2.5 mg/dL  PHOSPHORUS     Status: None   Collection Time    05/05/13 11:13 PM      Result Value Range   Phosphorus 3.2  2.3 - 4.6 mg/dL  TSH     Status: None   Collection Time    05/05/13 11:13 PM      Result Value Range   TSH 1.431  0.350 - 4.500 uIU/mL   Comment: Performed at [facility]  HEMOGLOBIN A1C     Status: None   Collection Time    05/05/13 11:13 PM      Result Value Range   Hemoglobin A1C 5.0  <5.7 %   Comment: (NOTE)                                                                               According to the ADA Clinical Practice Recommendations for 2011, when     HbA1c is used as a screening test:      >=6.5%   Diagnostic of Diabetes Mellitus               (if abnormal result is confirmed)     5.7-6.4%   Increased risk of developing Diabetes Mellitus     References:Diagnosis and Classification of Diabetes Mellitus,Diabetes     Care,2011,34(Suppl 1):S62-S69 and Standards of Medical Care in             Diabetes - 2011,Diabetes Care,2011,34 (Suppl 1):S11-S61.   Mean Plasma Glucose 97  <117 mg/dL   Comment: Performed at [facility]  PREPARE RBC (CROSSMATCH)       Status: None   Collection Time    05/05/13 11:30 PM      Result Value Range   Order Confirmation ORDER PROCESSED BY BLOOD BANK      No results found.             Blood pressure 124/61, pulse 80, temperature 98.3 F (36.8 C), temperature source Oral, resp. rate 20, height 5' 7" (1.702 m), weight 54.7 kg (120 lb 9.5 oz), SpO2 98.00%.  Physical exam:   General-- African-American male sitting in bed watching television Heart-- regular rate and rhythm without murmurs are gallops Lungs--clear Abdomen-- soft and completely nontender   Assessment: 1. G.I. bleeding/anemia. This is probably due to ulceration irritation at sphincterotomy site in the face of chronic  Plavix. The patient notes that he had black school for a week or so and now is cleared up somewhat that is stools are still positive. ERCP scope does not allow a thorough examination of the stomach and duodenum. And it is possible that he could have ulceration or some other lesion of the stomach or duodenal bulb. 2. Elevated liver tests. Markedly improved after ERCP and sphincterotomy. Patient is completely asymptomatic and is eating well without any symptoms. 3. History of CAD, peripheral vascular disease, cerebral vascular disease. Patient is on chronic Plavix  Plan: 1. We will keep him NPO for now and proceed with EGD later this morning. If he is having active bleeding at sphincterotomy site we can possibly do epinephrine injections. I have discussed this with the patient is agreeable.   Mileena Rothenberger JR,Zorawar Strollo L 05/06/2013, 8:19 AM      

## 2013-05-06 NOTE — Progress Notes (Signed)
Followup note:  Patient admitted earlier this morning. Doing okay. Patient seen after endoscopy. Hemoglobin improved to 9 after blood transfusion. Status post endoscopy showing no signs of ulceration. Patient had had recent ERCP with sphincterotomy about 3 weeks prior. Acute blood loss anemia felt to be secondary to bleeding from sphincterotomy. No signs of acute bleeding seen on endoscopy. No other sources of bleeding seen. Patient cleared to eat solid food. Recheck hemoglobin in morning and if normal, patient can be discharged home.

## 2013-05-06 NOTE — Op Note (Signed)
[facility] [address] [place], [address]   ENDOSCOPY PROCEDURE REPORT  PATIENT: [name], [name]  MR#: [id number] BIRTHDATE: 08-Jul-1933 , 79  yrs. old GENDER: Male ENDOSCOPIST:Brodee Mauritz Randa Evens, MD REFERRED BY: [facility]. PCP: Dr. Ferne Reus Polight PROCEDURE DATE:  05/06/2013 PROCEDURE:   EGD ASA CLASS:  class III INDICATIONS:   patient was admitted with drop and hemoglobin positive stools. He had ERCP in sphincterotomy about 3 weeks ago and has been on Plavix due to multiple medical problems. He did have melena for several days but his stool has subsequently turned brown. He has not had any abdominal pain. MEDICATION:   fentanyl 25 mcg versed 3 mg IV TOPICAL ANESTHETIC:    cetacaine spray  DESCRIPTION OF PROCEDURE:   The Pentax adult scope was inserted blindly into the esophagus with swallowing. The esophagus was completely normal with no esophagitis, Barrett's, or evidence of G.I. bleeding. The stomach was entered and was examined in the forward and retroflex view was normal. No abnormalities were seen and there was no active or recently bleeding. The pyloric channel was normal. The past way down to the 2nd duodenum and there was green bile present. The sphincterotomy site did not clearly be seen but there was no bleeding actively in no coffee grounds.. The duodenal bulb was normal. The scope was withdrawn in the initial findings were confirmed.     COMPLICATIONS: None  ENDOSCOPIC IMPRESSION: 1. subacute G.I. bleeding. There is no active bleeding the upper track at this time. I suspect that he bled from the sphincterotomy site for several days exacerbated by the Plavix. This appears to have resolved in his stools have begin to turn brown. This colonoscopies are up-to-date.  RECOMMENDATIONS: 1. will go ahead and resume diet. I think he could be discharged with his hemoglobin is stable and follow is  outpatient.    _______________________________ Rosalie Doctor:  Carman Ching, MD 05/06/2013 12:05 PM  CC: Dr. Nehemiah Settle

## 2013-05-06 NOTE — Interval H&P Note (Signed)
History and Physical Interval Note:  05/06/2013 11:40 AM  [name]  has presented today for surgery, with the diagnosis of GI bleed  The various methods of treatment have been discussed with the patient and family. After consideration of risks, benefits and other options for treatment, the patient has consented to  Procedure(s): ESOPHAGOGASTRODUODENOSCOPY (EGD) (N/A) as a surgical intervention .  The patient's history has been reviewed, patient examined, no change in status, stable for surgery.  I have reviewed the patient's chart and labs.  Questions were answered to the patient's satisfaction.     Shaydon Lease JR,Mirela Parsley L

## 2013-05-06 NOTE — H&P (Signed)
[facility] History and Physical  [name] WUJ:[id number] DOB: 01/27/1934 DOA: 05/05/2013  Referring physician: ED PCP: Katy Apo, MD  Specialists: Dr. Ewing Schlein, GI  Chief Complaint: Anemia  HPI: [name] is a 80 y.o. male  This patient comes to [place] today with anemia of hemoglobin 6.7 hematocrit 22.5. He has heme positive stool. He feels weak.  Last month he was admitted with acute cholangitis and dramatically elevated LFTs. After sphincterotomy he felt much better and his LFTs have been trending down. He was watching this with his outpatient physicians. Today he went in for a follow up with his primary care doctor and the anemia was discovered.  He says he feels well but does admit to feeling more fatigued than usual. He recalls 2 weeks ago wiping after a bowel movement and seeing some bright red blood on the toilet paper however this only happened a few times. For the last 2 weeks his legs have felt more achy and restless than usual and he has started taking naproxen once in the morning and once at night daily. He denies any stomachache.  Review of Systems: The patient denies anorexia, fever, weight loss,, vision loss, decreased hearing, hoarseness, chest pain, syncope, dyspnea on exertion, peripheral edema, balance deficits, hemoptysis, abdominal pain, melena, hematochezia, severe indigestion/heartburn, hematuria, incontinence, genital sores, muscle weakness, suspicious skin lesions, transient blindness, difficulty walking, depression, unusual weight change, abnormal bleeding, enlarged lymph nodes, angioedema, and breast masses.   Past Medical History  Diagnosis Date  . Asthma   . Hypertension   . Pulmonary embolism   . Peripheral artery disease   . Hypercholesteremia   . Bursitis of right shoulder   . DVT of leg (deep venous thrombosis)   . Cough   . Trouble swallowing   . Change in voice   . Difficulty urinating   . Easy bruising   . CAD (coronary artery disease)  10/02/2011    Severe 2 Vessel CAD with early Mid LAD 95% focal lesion followed by 60-70% irregular stenoses just after bifurcating SP1 and AV Groove Cx-OM1/OM2 bifurcation 90% lesion  . Stroke Feb. 2013  . Pneumonia Feb. 2013  . Myocardial infarction Sep 26, 2011  . Fall at home Sept. 2, 2014    Pt fell down 2 stairs   Past Surgical History  Procedure Laterality Date  . Hernia repair    . Appendectomy    . Femoral-femoral bypass graft    . Iliac artery stent    . Esophagogastroduodenoscopy  07/03/2011    Procedure: ESOPHAGOGASTRODUODENOSCOPY (EGD);  Surgeon: Shirley Friar, MD;  Location: [facility];  Service: Endoscopy;  Laterality: N/A;  . Compression hip screw  06/26/2011    Procedure: COMPRESSION HIP;  Surgeon: Raymon Mutton, MD;  Location: [facility];  Service: Orthopedics;  Laterality: Right;  . Laparoscopy  07/10/2011    Procedure: LAPAROSCOPY DIAGNOSTIC;  Surgeon: Ardeth Sportsman, MD;  Location: [facility];  Service: General;  Laterality: N/A;  serousal repair,  . Esophagogastroduodenoscopy  09/28/2011    Procedure: ESOPHAGOGASTRODUODENOSCOPY (EGD);  Surgeon: Willis Modena, MD;  Location: [facility];  Service: Endoscopy;  Laterality: N/A;  . Femoral-femoral bypass graft  10/05/2011    Procedure: BYPASS GRAFT FEMORAL-FEMORAL ARTERY;  Surgeon: Larina Earthly, MD;  Location: [facility];  Service: Vascular;  Laterality: Bilateral;  Thrombectomy of Femoral-Femoral artery bypass graft  . Fracture surgery  06/24/11    Right Hip  . Joint replacement  06/24/11    Right Hip  . Small  intestine surgery  March 2013  . Ercp N/A 04/20/2013    Procedure: ENDOSCOPIC RETROGRADE CHOLANGIOPANCREATOGRAPHY (ERCP);  Surgeon: Petra KubaMarc E Magod, MD;  Location: [facility];  Service: Endoscopy;  Laterality: N/A;   Social History:  reports that he quit smoking about 19 years ago. He has never used smokeless tobacco. He reports that he does not drink alcohol or use illicit drugs. She typically lives at home and is able to do  ADLs  No Known Allergies  Family History  Problem Relation Age of Onset  . Hyperlipidemia Father   . Deep vein thrombosis Father     Prior to Admission medications   Medication Sig Start Date End Date Taking? Authorizing Provider  aspirin EC 81 MG tablet Take 81 mg by mouth every evening.    Yes Historical Provider, MD  citalopram (CELEXA) 20 MG tablet Take 20 mg by mouth every morning.  07/27/12  Yes Historical Provider, MD  clopidogrel (PLAVIX) 75 MG tablet Take 75 mg by mouth every morning.    Yes Historical Provider, MD  diltiazem (DILACOR XR) 180 MG 24 hr capsule Take 180 mg by mouth every morning.    Yes Historical Provider, MD  isosorbide mononitrate (IMDUR) 30 MG 24 hr tablet Take 30 mg by mouth every morning.    Yes Historical Provider, MD  lactose free nutrition (BOOST) LIQD Take 237 mLs by mouth 3 (three) times daily. Chocolate.   Yes Historical Provider, MD  metoprolol (LOPRESSOR) 100 MG tablet Take 100 mg by mouth 2 (two) times daily.   Yes Historical Provider, MD  Multiple Vitamin (MULTIVITAMIN WITH MINERALS) TABS tablet Take 1 tablet by mouth every morning.    Yes Historical Provider, MD  nitroGLYCERIN (NITROSTAT) 0.4 MG SL tablet Place 0.4 mg under the tongue every 5 (five) minutes as needed for chest pain.   Yes Historical Provider, MD  pantoprazole (PROTONIX) 40 MG tablet Take 40 mg by mouth every morning.    Yes Historical Provider, MD  polyethylene glycol (MIRALAX / GLYCOLAX) packet Take 17 g by mouth daily as needed for moderate constipation.   Yes Historical Provider, MD  simvastatin (ZOCOR) 10 MG tablet Take 10 mg by mouth every evening.    Yes Historical Provider, MD  Tamsulosin HCl (FLOMAX) 0.4 MG CAPS Take 0.4 mg by mouth every morning.    Yes Historical Provider, MD  VENTOLIN HFA 108 (90 BASE) MCG/ACT inhaler Inhale 2 puffs into the lungs every 4 (four) hours as needed for wheezing or shortness of breath.  01/16/13  Yes Historical Provider, MD  vitamin C (ASCORBIC  ACID) 500 MG tablet Take 500 mg by mouth every morning.    Yes Historical Provider, MD  zinc sulfate 220 MG capsule Take 220 mg by mouth every morning.   Yes Historical Provider, MD   Physical Exam: Filed Vitals:   05/06/13 0145  BP: 118/49  Pulse: 76  Temp: 97.6 F (36.4 C)  Resp: 18   Nursing note and vitals reviewed. Constitutional: He is oriented to person, place, and time. He  appears well-developed and well-nourished.  HENT:  Nose: Nose normal.  Mouth/Throat: Oropharynx is clear and moist. No oropharyngeal exudate.  Eyes: Conjunctivae are normal. Pupils are equal, round, and reactive to light.  Neck: Normal range of motion. Neck supple. No thyromegaly present.  Cardiovascular: Normal rate, regular rhythm and normal heart sounds.   Pulmonary/Chest: Effort normal and breath sounds normal.  Abdominal: Soft. Bowel sounds are normal.  no distension. There is no tenderness.  There is no rebound.  Lymphadenopathy:    He has no cervical adenopathy.  Neurological: He is alert and oriented to person, place, and time. He has normal reflexes.  Skin: Skin is warm and dry.He has no concerning moles or skin lesions. He is pale. Palms of his hands and under his tongue are very pale. Conjunctivae Pale  Psychiatric: He has a normal mood and affect. His behavior is normal.   Labs on Admission:  Basic Metabolic Panel:  Recent Labs Lab 05/05/13 1745 05/05/13 2313  NA 139  --   K 4.5  --   CL 104  --   CO2 23  --   GLUCOSE 123*  --   BUN 22  --   CREATININE 1.21  --   CALCIUM 8.8 8.3*  MG  --  1.8  PHOS  --  3.2   Liver Function Tests:  Recent Labs Lab 05/05/13 1745  AST 59*  ALT 70*  ALKPHOS 492*  BILITOT 1.7*  PROT 7.1  ALBUMIN 2.7*   No results found for this basename: LIPASE, AMYLASE,  in the last 168 hours No results found for this basename: AMMONIA,  in the last 168 hours CBC:  Recent Labs Lab 05/05/13 1745  WBC 3.8*  NEUTROABS 2.4  HGB 6.7*  HCT 22.5*  MCV  92.2  PLT 313   Cardiac Enzymes: No results found for this basename: CKTOTAL, CKMB, CKMBINDEX, TROPONINI,  in the last 168 hours  BNP (last 3 results)  Recent Labs  04/20/13 0545  PROBNP 1499.0*   CBG: No results found for this basename: GLUCAP,  in the last 168 hours  Radiological Exams on Admission: No results found.  EKG: Independently reviewed.   Assessment/Plan Patient Active Problem List   Diagnosis Date Noted  . Protein-calorie malnutrition, severe 04/21/2013  . Hypotension, unspecified 04/20/2013  . Cholecystitis, acute 04/20/2013  . Renal insufficiency 04/20/2013  . Abdominal pain 04/19/2013  . Primary sclerosing cholangitis 04/19/2013  . PVD (peripheral vascular disease) 08/09/2012  . Stiffness of joint, lower leg 05/10/2012  . Chronic total occlusion of artery of the extremities 01/26/2012  . Peripheral vascular disease, unspecified 10/20/2011  . Cardiomyopathy, EF 40-50% 2D 10/07/2011  . Sinus tachycardia 10/05/2011  . BPH (benign prostatic hypertrophy) with urinary retention 10/03/2011  . CAD (coronary artery disease) 10/02/2011  . Cardiomyopathy, EF 40-50% 2D 09/27/11 09/30/2011  . Anemia, transfused this admission. 09/30/2011  . GI bleed, endo 09/28/11 esophagitis, duodenitis. Coumadin discontinued 09/27/2011  . NSTEMI, BMS to CFX/LAD  10/03/11 09/27/2011  . DVT 3/13 09/27/2011  . 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 History of GI bleed Feb 2013 09/27/2011  . History of renal insufficiency syndrome, 7/12 and 3/13- SCr Nl (1.0) now 09/27/2011  . Brunner's gland hyperplasia of duodenum 08/21/2011  . Dysphagia S/P CVA (cerebrovascular accident) 08/21/2011  . Acute respiratory failure with hypoxia 07/10/2011  . Tachycardia 07/10/2011  . SBO (small bowel obstruction), lysis of adhesions Feb 2013 07/07/2011  . Fecal impaction in rectum 07/07/2011  . Chronic constipation 07/07/2011  . PE (pulmonary embolism) July 2012, IVC filter placed 10/03/11 06/24/2011  .  Chronic anticoagulation, stopped secondary to GI bleeding 06/24/2011  . Fall from standing 06/24/2011  . Fracture of hip, right, surg -Feb 2013 06/24/2011  . PVD, thrombosed R-L BPG, S/P thrombectomy 10/05/11 06/24/2011      Anemia: Secondary most likely to a GI bleed. GI has been consulted. We'll transfuse 2 units of PRBCs. We'll keep him n.p.o.  except meds until GI has seen him. Notably although the patient does not recall having a GI bleed in the past, his chart indicates that he in fact had one in May of 2013. He had an EGD at that time which showed esophagitis and duodenitis. Anticoagulation was stopped at that point. It has since restarted.  Elevated liver function tests: Secondary to cholangitis (primary sclerosing cholangitis) status post sphincterotomy: Is not having any right upper quadrant pain at this time. Recommendation was for discussion about having his gallbladder surgically removed some point.  This patient has had DVTs and pulmonary emboli and now has an IVC filter.  Protein calorie malnutrition:bOost  Shakes   GI has been consulted  Code Status: Full  Family Communication: None  Disposition Plan: Admitted to telemetry  Time spent: 1 hour  Acey Lav [facility] Pager [phone]  If 7PM-7AM, please contact night-coverage [email or web address] Password [email or web address] 05/06/2013, 2:46 AM

## 2013-05-07 DIAGNOSIS — D649 Anemia, unspecified: Secondary | ICD-10-CM

## 2013-05-07 LAB — TYPE AND SCREEN
ABO/RH(D): A POS
Antibody Screen: NEGATIVE
Unit division: 0
Unit division: 0

## 2013-05-07 LAB — CBC
HEMATOCRIT: 29.5 % — AB (ref 39.0–52.0)
Hemoglobin: 9.2 g/dL — ABNORMAL LOW (ref 13.0–17.0)
MCH: 27.5 pg (ref 26.0–34.0)
MCHC: 31.2 g/dL (ref 30.0–36.0)
MCV: 88.1 fL (ref 78.0–100.0)
Platelets: 225 10*3/uL (ref 150–400)
RBC: 3.35 MIL/uL — ABNORMAL LOW (ref 4.22–5.81)
RDW: 19.6 % — ABNORMAL HIGH (ref 11.5–15.5)
WBC: 3.6 10*3/uL — AB (ref 4.0–10.5)

## 2013-05-07 MED ORDER — FERROUS SULFATE 325 (65 FE) MG PO TABS: 325.0000 mg | ORAL_TABLET | Freq: Three times a day (TID) | ORAL | Status: DC

## 2013-05-07 NOTE — Discharge Summary (Signed)
Physician Discharge Summary  Durward Matranga ZOX:[id number] DOB: [birth year] DOA: 05/05/2013  PCP: Katy Apo, MD  Admit date: 05/05/2013 Discharge date: 05/07/2013  Time spent: > 35 minutes  Recommendations for Outpatient Follow-up:  1) please see discharge instructions listed below Will discharge with ferrous sulfate supplementation  Discharge Diagnoses:  Principal Problem:   GIB (gastrointestinal bleeding) Active Problems:   PE (pulmonary embolism) July 2012, IVC filter placed 10/03/11   Chronic anticoagulation, stopped secondary to GI bleeding   PVD, thrombosed R-L BPG, S/P thrombectomy 10/05/11   Chronic constipation   GI bleed, endo 09/28/11 esophagitis, duodenitis. Coumadin discontinued   CAD (coronary artery disease)   Cardiomyopathy, EF 40-50% 2D   Protein-calorie malnutrition, severe   Discharge Condition: Stable, no active bleeding  Diet recommendation: Heart healthy  Filed Weights   05/05/13 2213  Weight: 54.7 kg (120 lb 9.5 oz)    History of present illness:  The patient is a 80 year old with history of MI and stroke on (aspirin and Plavix) in the past and recent ERCP with sphincterotomy and stone extraction by Dr. Ewing Schlein approximately 3 weeks prior to presentation. He presented to the ED after laboratory evaluation revealed anemia  Hospital Course:  Principal Problem:   GIB (gastrointestinal bleeding) - Thought to be secondary to sphincterotomy 3 weeks prior to presentation. EGD was negative. Recommendations are for followup with gastroenterologist within the next week for CBC - Will discharge with ferrous sulfate supplementation - Patient was transfused 2 units of packed red blood cells and hemoglobin on repeat remained at 9.2. Patient had no active bleeding on day of discharge  Active Problems:   PE (pulmonary embolism) July 2012, IVC filter placed 10/03/11   Chronic anticoagulation, stopped secondary to GI bleeding   PVD, thrombosed R-L BPG, S/P thrombectomy  10/05/11   Chronic constipation   GI bleed, endo 09/28/11 esophagitis, duodenitis. Coumadin discontinued   CAD (coronary artery disease) - We'll continue Plavix and aspirin. We'll have to monitor hemoglobin levels closely this is been discussed with patient and son at bedside   Cardiomyopathy, EF 40-50% 2D   Protein-calorie malnutrition, severe  Procedures:  Upper EGD  Consultations:  Gastroenterologist Dr. Randa Evens  Discharge Exam: Filed Vitals:   05/07/13 1341  BP: 120/54  Pulse: 73  Temp: 97.5 F (36.4 C)  Resp: 16    General: Patient in no acute distress alert and awake Cardiovascular: Regular rate and rhythm, no murmurs Respiratory: Clear to auscultation, no wheezes  Discharge Instructions  Discharge Orders   Future Appointments Provider Department Dept Phone   09/07/2013 9:30 AM [facility] [place] [facility] [address] [phone]   09/07/2013 10:30 AM [facility] [place] [facility] [address] [phone]   09/07/2013 11:00 AM Carma Lair Nickel, NP [facility] [phone]   Future Orders Complete By Expires   Call MD for:  difficulty breathing, headache or visual disturbances  As directed    Call MD for:  extreme fatigue  As directed    Call MD for:  temperature >100.4  As directed    Diet - low sodium heart healthy  As directed    Discharge instructions  As directed    Comments:     Following up with your gastroenterologist within one week for repeat CBC.   Increase activity slowly  As directed        Medication List         aspirin EC 81 MG tablet  Take 81 mg by mouth every  evening.     citalopram 20 MG tablet  Commonly known as:  CELEXA  Take 20 mg by mouth every morning.     clopidogrel 75 MG tablet  Commonly known as:  PLAVIX  Take 75 mg by mouth every morning.     diltiazem 180 MG 24 hr capsule  Commonly known as:  DILACOR XR  Take 180 mg by mouth every morning.     ferrous sulfate 325 (65 FE)  MG tablet  Take 1 tablet (325 mg total) by mouth 3 (three) times daily with meals.     FLOMAX 0.4 MG Caps capsule  Generic drug:  tamsulosin  Take 0.4 mg by mouth every morning.     isosorbide mononitrate 30 MG 24 hr tablet  Commonly known as:  IMDUR  Take 30 mg by mouth every morning.     lactose free nutrition Liqd  Take 237 mLs by mouth 3 (three) times daily. Chocolate.     metoprolol 100 MG tablet  Commonly known as:  LOPRESSOR  Take 100 mg by mouth 2 (two) times daily.     multivitamin with minerals Tabs tablet  Take 1 tablet by mouth every morning.     nitroGLYCERIN 0.4 MG SL tablet  Commonly known as:  NITROSTAT  Place 0.4 mg under the tongue every 5 (five) minutes as needed for chest pain.     pantoprazole 40 MG tablet  Commonly known as:  PROTONIX  Take 40 mg by mouth every morning.     polyethylene glycol packet  Commonly known as:  MIRALAX / GLYCOLAX  Take 17 g by mouth daily as needed for moderate constipation.     simvastatin 10 MG tablet  Commonly known as:  ZOCOR  Take 10 mg by mouth every evening.     VENTOLIN HFA 108 (90 BASE) MCG/ACT inhaler  Generic drug:  albuterol  Inhale 2 puffs into the lungs every 4 (four) hours as needed for wheezing or shortness of breath.     vitamin C 500 MG tablet  Commonly known as:  ASCORBIC ACID  Take 500 mg by mouth every morning.     zinc sulfate 220 MG capsule  Take 220 mg by mouth every morning.       No Known Allergies    The results of significant diagnostics from this hospitalization (including imaging, microbiology, ancillary and laboratory) are listed below for reference.    Significant Diagnostic Studies: Ct Abdomen Pelvis Wo Contrast  04/19/2013   CLINICAL DATA:  Bilateral abdominal pain, worse on the right. Vomiting.  EXAM: CT ABDOMEN AND PELVIS WITHOUT CONTRAST  TECHNIQUE: Multidetector CT imaging of the abdomen and pelvis was performed following the standard protocol without intravenous contrast.   COMPARISON:  07/06/2011  FINDINGS: BODY WALL: Status post ventral abdominal hernia repair with mesh.  LOWER CHEST: Streaky lower lung opacities is likely atelectasis.  ABDOMEN/PELVIS:  Liver:  Stable multiple cysts as noted on previous infused imaging.  Biliary: Newly dilated common bile duct, 14 mm in diameter. The duct is dilated to the ampulla. No radiodense choledocholithiasis. No evidence of mass. No evidence of pancreatic ductal dilatation.  Pancreas: Unremarkable.  Spleen: Unremarkable.  Adrenals: Unremarkable.  Kidneys and ureters: No hydronephrosis. Punctate stone in the interpolar left kidney.  Bladder: Unremarkable.  Reproductive: Unremarkable.  Bowel: No obstruction. No pericecal inflammation. Colonic diverticulosis.  Retroperitoneum: No mass or adenopathy.  Peritoneum: No free fluid or gas.  Vascular: Status post bifem bypass. IVC filter present. Right aortoiliac stenting.  OSSEOUS: Distal portions of right femoral neck fixation hardware shows no acute abnormality. Degenerative disc disease with dextroscoliosis.  IMPRESSION: 1. New biliary dilatation to level of the papilla. No visible cause by CT, reference previous sonography. 2. Chronic findings stable from prior and noted above.   Electronically Signed   By: Tiburcio PeaJonathan  Watts M.D.   On: 04/19/2013 23:15   Dg Chest 2 View  04/18/2013   CLINICAL DATA:  Cough, congestion  EXAM: CHEST  2 VIEW  COMPARISON:  None.  FINDINGS: There is chronic elevation of the right hemidiaphragm. No focal regions of consolidation nor focal infiltrates are appreciated. The cardiac silhouette is within normal limits. There is dextroscoliosis of the thoracolumbar spine.  IMPRESSION: Chronic elevation right hemidiaphragm by evidence of acute cardiopulmonary disease.   Electronically Signed   By: Salome HolmesHector  Cooper M.D.   On: 04/18/2013 11:33   [place] Abdomen Complete  04/19/2013   CLINICAL DATA:  Nausea  EXAM: ULTRASOUND ABDOMEN COMPLETE  COMPARISON:  None.  FINDINGS:  Gallbladder:  No gallstones or wall thickening visualized. There is gallbladder sludge present. No sonographic Murphy sign noted.  Common bile duct:  Diameter: 16.1 mm  Liver:  There is mild intrahepatic biliary ductal dilatation. There is a 2.8 x 1.9 x 2.5 cm lobulated anechoic left hepatic mass most consistent with a cyst which was visualized on prior CT abdomen dated 07/06/2011.  IVC:  No abnormality visualized.  Pancreas:  Visualized portion unremarkable.  Spleen:  Size and appearance within normal limits.  Right Kidney:  Length: 9.9 cm. Echogenicity is increased. No mass or hydronephrosis visualized.  Left Kidney:  Length: 9.9 cm. Echogenicity is increased. No mass or hydronephrosis visualized.  Abdominal aorta:  No aneurysm visualized.  Other findings:  None.  IMPRESSION: 1. No cholelithiasis. Dilated common bile duct measuring 16.1 mm with intrahepatic biliary ductal dilatation. There is no obstructing mass identified. There is echogenic material within the distal common bile duct which may be artifact versus choledocholithiasis. Recommend further evaluation with MRCP.   Electronically Signed   By: Elige KoHetal  Patel   On: 04/19/2013 10:36   Dg Chest Port 1 View  04/19/2013   CLINICAL DATA:  Abdominal pain, weakness, rule out free abdominal air  EXAM: PORTABLE CHEST - 1 VIEW  COMPARISON:  04/18/2013  FINDINGS: Cardiomediastinal silhouette is stable. There is small left pleural effusion with blunting of the left costophrenic angle. No free abdominal air is noted in upper abdomen. No pulmonary edema.  IMPRESSION: Small left pleural effusion with left basilar atelectasis. No subdiaphragmatic free air.   Electronically Signed   By: Natasha MeadLiviu  Pop M.D.   On: 04/19/2013 20:42   Dg Ercp  04/20/2013   CLINICAL DATA:  Common bile duct stone.  EXAM: ERCP  TECHNIQUE: Multiple spot images obtained with the fluoroscopic device and submitted for interpretation post-procedure.  COMPARISON:  CT scan and ultrasound dated  04/19/2013  FINDINGS: Three spot films are available from the ERCP. There is limited visualization of the biliary tree on the available images.  IMPRESSION: ERCP performed.  See procedure report for further information.  These images were submitted for radiologic interpretation only. Please see the procedural report for the amount of contrast and the fluoroscopy time utilized.   Electronically Signed   By: Geanie CooleyJim  Maxwell M.D.   On: 04/20/2013 18:18    Microbiology: No results found for this or any previous visit (from the past 240 hour(s)).   Labs: Basic Metabolic Panel:  Recent Labs Lab 05/05/13 1745  05/05/13 2313 05/06/13 0910  NA 139  --  140  K 4.5  --  4.3  CL 104  --  106  CO2 23  --  25  GLUCOSE 123*  --  88  BUN 22  --  14  CREATININE 1.21  --  1.03  CALCIUM 8.8 8.3* 8.4  MG  --  1.8  --   PHOS  --  3.2  --    Liver Function Tests:  Recent Labs Lab 05/05/13 1745 05/06/13 0910  AST 59* 54*  ALT 70* 59*  ALKPHOS 492* 419*  BILITOT 1.7* 2.0*  PROT 7.1 6.1  ALBUMIN 2.7* 2.3*   No results found for this basename: LIPASE, AMYLASE,  in the last 168 hours No results found for this basename: AMMONIA,  in the last 168 hours CBC:  Recent Labs Lab 05/05/13 1745 05/06/13 0910 05/07/13 0433  WBC 3.8* 3.5* 3.6*  NEUTROABS 2.4  --   --   HGB 6.7* 9.2* 9.2*  HCT 22.5* 28.2* 29.5*  MCV 92.2 87.0 88.1  PLT 313 248 225   Cardiac Enzymes: No results found for this basename: CKTOTAL, CKMB, CKMBINDEX, TROPONINI,  in the last 168 hours BNP: BNP (last 3 results)  Recent Labs  04/20/13 0545  PROBNP 1499.0*   CBG: No results found for this basename: GLUCAP,  in the last 168 hours     Signed:  Penny Pia  [facility] 05/07/2013, 2:02 PM

## 2013-05-07 NOTE — Progress Notes (Signed)
   CARE MANAGEMENT NOTE 05/07/2013  Patient:  [name], [name]   Account Number:  [id number]  Date Initiated:  05/06/2013  Documentation initiated by:  [facility]  Subjective/Objective Assessment:   anemia     Action/Plan:   lives at home with wife, has RW, shower chair, wheelchair at home   Anticipated DC Date:     Anticipated DC Plan:  HOME W HOME HEALTH SERVICES      DC Planning Services  CM consult      [facility] Choice  Resumption Of Svcs/PTA Provider   Choice offered to / List presented to:          [facility] arranged  HH-2 PT      [facility] agency  [facility]   Status of service:  Completed, signed off Medicare Important Message given?   (If response is "NO", the following Medicare IM given date fields will be blank) Date Medicare IM given:   Date Additional Medicare IM given:    Discharge Disposition:  HOME W HOME HEALTH SERVICES  Per UR Regulation:    If discussed at Long Length of Stay Meetings, dates discussed:    Comments:  05/06/2013 1100 Active with [facility] for HHPT. Pt may need HH RN at dc. NCM will continue to follow for dc needs.  Isidoro Donning RN CCM Case Mgmt phone [phone]

## 2013-05-07 NOTE — Progress Notes (Signed)
[facility] PROGRESS NOTE Subjective Pt eating well, no gross bleeding. EGD negative.  Objective: Vital signs in last 24 hours: Temp:  [97.5 F (36.4 C)-97.7 F (36.5 C)] 97.7 F (36.5 C) (01/04 0445) Pulse Rate:  [64-68] 64 (01/04 0445) Resp:  [14] 14 (01/04 0445) BP: (124-143)/(57-68) 143/68 mmHg (01/04 0445) SpO2:  [99 %-100 %] 99 % (01/04 0445) Last BM Date: 05/05/13  Intake/Output from previous day: 01/03 0701 - 01/04 0700 In: 3104.6 [P.O.:240; I.V.:2864.6] Out: 925 [Urine:925] Intake/Output this shift: Total I/O In: 240 [P.O.:240] Out: 201 [Urine:200; Stool:1]  PE:  Abdomen--soft nontender  Lab Results:  Recent Labs  05/05/13 1745 05/06/13 0910 05/07/13 0433  WBC 3.8* 3.5* 3.6*  HGB 6.7* 9.2* 9.2*  HCT 22.5* 28.2* 29.5*  PLT 313 248 225   BMET  Recent Labs  05/05/13 1745 05/06/13 0910  NA 139 140  K 4.5 4.3  CL 104 106  CO2 23 25  CREATININE 1.21 1.03   LFT  Recent Labs  05/05/13 1745 05/06/13 0910  PROT 7.1 6.1  AST 59* 54*  ALT 70* 59*  ALKPHOS 492* 419*  BILITOT 1.7* 2.0*   PT/INR  Recent Labs  05/05/13 1745  LABPROT 13.9  INR 1.09   PANCREAS No results found for this basename: LIPASE,  in the last 72 hours       Studies/Results: No results found.  Medications: I have reviewed the patient's current medications.  Assessment/Plan: 1. GI Bleed. Has resolved suspect was due to sphincterotomy site in face of plavix,. No bleeding on EGD can  Probably be discharged home and follow-up with Dr Nehemiah Settle next week and repeat the CBC.   [name],[name] 05/07/2013, 1:06 PM

## 2013-05-08 ENCOUNTER — Encounter (HOSPITAL_COMMUNITY): Payer: Self-pay | Admitting: Gastroenterology

## 2013-06-20 ENCOUNTER — Ambulatory Visit: Payer: Medicare HMO | Admitting: Cardiovascular Disease

## 2013-07-25 ENCOUNTER — Ambulatory Visit: Payer: Medicare HMO | Admitting: Cardiovascular Disease

## 2013-08-01 ENCOUNTER — Encounter: Payer: Self-pay | Admitting: Cardiovascular Disease

## 2013-08-01 ENCOUNTER — Ambulatory Visit (INDEPENDENT_AMBULATORY_CARE_PROVIDER_SITE_OTHER): Payer: Commercial Managed Care - HMO | Admitting: Cardiovascular Disease

## 2013-08-01 VITALS — BP 118/68 | HR 72 | Ht 67.0 in | Wt 128.0 lb

## 2013-08-01 DIAGNOSIS — I739 Peripheral vascular disease, unspecified: Secondary | ICD-10-CM

## 2013-08-01 DIAGNOSIS — I2510 Atherosclerotic heart disease of native coronary artery without angina pectoris: Secondary | ICD-10-CM

## 2013-08-01 DIAGNOSIS — E785 Hyperlipidemia, unspecified: Secondary | ICD-10-CM | POA: Insufficient documentation

## 2013-08-01 NOTE — Assessment & Plan Note (Signed)
On statin therapy followed by his PCP 

## 2013-08-01 NOTE — Progress Notes (Signed)
08/01/2013 [name]   [birth year]  [id number]  Primary Physician Katy Apo, MD Primary Cardiologist: Runell Gess MD Roseanne Reno   HPI:  The patient is a pleasant, 80 year old male who has been followed by Dr. Allyson Sabal. He has a history of vascular disease. He had a fem-fem bypass graft about 15 years ago by Dr. Liliane Bade. Dr. Allyson Sabal did a right iliac PTA in October 2011. The patient had a small bowel obstruction in February 2013 and had lysis of adhesions. In March 2013 he had a right brain stroke and a pulmonary embolism and bilateral DVT. He was put on Coumadin. He presented with a GI bleed in May 2013. Endoscopy then showed esophagitis and duodenitis. That hospitalization was long and complicated. He had a non-ST-elevation MI and had a catheterization and subsequent circumflex and LAD bare-metal stenting. Because of his GI bleeding an IVC filter was placed and he was taken off Coumadin. He had an occlusion of his fem-fem bypass graft and had to have a thrombectomy during that hospitalization as well. Ultimately, he recovered. Since Dr. Allyson Sabal saw him last, November 2013, he has been doing well from a cardiac standpoint. He admits he has dyspnea on exertion especially going up stairs but this is not new. He has not had angina.he saw Corine Shelter in the office 09/08/12. I have not seen him for over a year. He has had multiple GI bleeds in the past as well as cholecystitis requiring sphincterotomy. He's had a stroke as well and they'll be the walker and is minimally ambulatory. He denies chest pain or shortness of breath.   Current Outpatient Prescriptions  Medication Sig Dispense Refill  . aspirin EC 81 MG tablet Take 81 mg by mouth every evening.       . citalopram (CELEXA) 20 MG tablet Take 20 mg by mouth every morning.       . clopidogrel (PLAVIX) 75 MG tablet Take 75 mg by mouth every morning.       . diltiazem (DILACOR XR) 180 MG 24 hr capsule Take 180 mg by mouth every  morning.       . ferrous sulfate 325 (65 FE) MG tablet Take 1 tablet (325 mg total) by mouth 3 (three) times daily with meals.  60 tablet  0  . isosorbide mononitrate (IMDUR) 30 MG 24 hr tablet Take 30 mg by mouth every morning.       . lactose free nutrition (BOOST) LIQD Take 237 mLs by mouth 3 (three) times daily. Chocolate.      . Loratadine 10 MG CAPS       . metoprolol (LOPRESSOR) 100 MG tablet Take 100 mg by mouth 2 (two) times daily.      . Multiple Vitamin (MULTIVITAMIN WITH MINERALS) TABS tablet Take 1 tablet by mouth every morning.       . nitroGLYCERIN (NITROSTAT) 0.4 MG SL tablet Place 0.4 mg under the tongue every 5 (five) minutes as needed for chest pain.      . pantoprazole (PROTONIX) 40 MG tablet Take 40 mg by mouth every morning.       . polyethylene glycol (MIRALAX / GLYCOLAX) packet Take 17 g by mouth daily as needed for moderate constipation.      . simvastatin (ZOCOR) 10 MG tablet Take 10 mg by mouth every evening.       . Tamsulosin HCl (FLOMAX) 0.4 MG CAPS Take 0.4 mg by mouth every morning.       Marland Kitchen  VENTOLIN HFA 108 (90 BASE) MCG/ACT inhaler Inhale 2 puffs into the lungs every 4 (four) hours as needed for wheezing or shortness of breath.       . vitamin C (ASCORBIC ACID) 500 MG tablet Take 500 mg by mouth every morning.       . zinc sulfate 220 MG capsule Take 220 mg by mouth every morning.       No current facility-administered medications for this visit.    No Known Allergies  History   Social History  . Marital Status: Married    Spouse Name: N/A    Number of Children: N/A  . Years of Education: N/A   Occupational History  . Not on file.   Social History Main Topics  . Smoking status: Former Smoker    Quit date: 08/20/1993  . Smokeless tobacco: Never Used  . Alcohol Use: No  . Drug Use: No  . Sexual Activity: No   Other Topics Concern  . Not on file   Social History Narrative  . No narrative on file     Review of Systems: General: negative for  chills, fever, night sweats or weight changes.  Cardiovascular: negative for chest pain, dyspnea on exertion, edema, orthopnea, palpitations, paroxysmal nocturnal dyspnea or shortness of breath Dermatological: negative for rash Respiratory: negative for cough or wheezing Urologic: negative for hematuria Abdominal: negative for nausea, vomiting, diarrhea, bright red blood per rectum, melena, or hematemesis Neurologic: negative for visual changes, syncope, or dizziness All other systems reviewed and are otherwise negative except as noted above.    Blood pressure 118/68, pulse 72, height 5\' 7"  (1.702 m), weight 128 lb (58.06 kg).  General appearance: alert and no distress Neck: no adenopathy, no carotid bruit, no JVD, supple, symmetrical, trachea midline and thyroid not enlarged, symmetric, no tenderness/mass/nodules Lungs: clear to auscultation bilaterally Heart: regular rate and rhythm, S1, S2 normal, no murmur, click, rub or gallop Extremities: extremities normal, atraumatic, no cyanosis or edema  EKG normal sinus rhythm at 72 early repolarization changes  ASSESSMENT AND PLAN:   CAD (coronary artery disease) The patient had cardiac catheterization performed by Dr. Bryan Lemmaavid Harding in May of 2013 in the setting of severe anemia with a non-ST segment elevation myocardial function. He ultimately had stenting of 2 vessels with bare-metal stents. He's had no further chest pain.  Peripheral vascular disease, unspecified History of occluded left iliac, stented right common and external iliac artery with fem-fem crossover graft in performed by Dr. Liliane BadeGreg Hayes approximately 15 years ago. He does have occluded SFAs bilaterally with tibial vessel disease. He is minimally ambulatory especially after a stroke and walks in a house with a walker.  Hyperlipidemia On statin therapy followed by his PCP      Runell GessJonathan J. Armenia Silveria MD [facility] - Lake CityFACP,FACC,FAHA, [facility] Surgery CenterFSCAI 08/01/2013 10:39 AM

## 2013-08-01 NOTE — Patient Instructions (Signed)
We request that you follow-up in: 6 months with an extender and in 1 year with Dr Berry  You will receive a reminder letter in the mail two months in advance. If you don't receive a letter, please call our office to schedule the follow-up appointment.   

## 2013-08-01 NOTE — Assessment & Plan Note (Signed)
History of occluded left iliac, stented right common and external iliac artery with fem-fem crossover graft in performed by Dr. Liliane Bade approximately 15 years ago. He does have occluded SFAs bilaterally with tibial vessel disease. He is minimally ambulatory especially after a stroke and walks in a house with a walker.

## 2013-08-01 NOTE — Assessment & Plan Note (Signed)
The patient had cardiac catheterization performed by Dr. Bryan Lemma in May of 2013 in the setting of severe anemia with a non-ST segment elevation myocardial function. He ultimately had stenting of 2 vessels with bare-metal stents. He's had no further chest pain.

## 2013-09-06 ENCOUNTER — Encounter: Payer: Self-pay | Admitting: Family

## 2013-09-07 ENCOUNTER — Ambulatory Visit (HOSPITAL_COMMUNITY)
Admission: RE | Admit: 2013-09-07 | Discharge: 2013-09-07 | Disposition: A | Discharge status: Home / Self Care | Payer: Medicare HMO | Source: Ambulatory Visit | Attending: Vascular Surgery | Admitting: Vascular Surgery

## 2013-09-07 ENCOUNTER — Ambulatory Visit: Payer: Medicare HMO | Admitting: Family

## 2013-09-07 ENCOUNTER — Ambulatory Visit (INDEPENDENT_AMBULATORY_CARE_PROVIDER_SITE_OTHER)
Admission: RE | Admit: 2013-09-07 | Discharge: 2013-09-07 | Disposition: A | Discharge status: Home / Self Care | Payer: Medicare HMO | Source: Ambulatory Visit | Attending: Vascular Surgery | Admitting: Vascular Surgery

## 2013-09-07 DIAGNOSIS — I6529 Occlusion and stenosis of unspecified carotid artery: Secondary | ICD-10-CM | POA: Insufficient documentation

## 2013-09-07 DIAGNOSIS — M79609 Pain in unspecified limb: Secondary | ICD-10-CM

## 2013-09-07 DIAGNOSIS — I739 Peripheral vascular disease, unspecified: Secondary | ICD-10-CM

## 2013-09-07 DIAGNOSIS — I658 Occlusion and stenosis of other precerebral arteries: Secondary | ICD-10-CM | POA: Insufficient documentation

## 2013-09-11 DIAGNOSIS — J449 Chronic obstructive pulmonary disease, unspecified: Secondary | ICD-10-CM

## 2013-09-11 HISTORY — DX: Chronic obstructive pulmonary disease, unspecified: J44.9

## 2013-09-12 ENCOUNTER — Ambulatory Visit
Admission: RE | Admit: 2013-09-12 | Discharge: 2013-09-12 | Disposition: A | Discharge status: Home / Self Care | Payer: Commercial Managed Care - HMO | Source: Ambulatory Visit | Attending: Internal Medicine | Admitting: Internal Medicine

## 2013-09-12 ENCOUNTER — Other Ambulatory Visit: Payer: Self-pay | Admitting: Internal Medicine

## 2013-09-12 DIAGNOSIS — J40 Bronchitis, not specified as acute or chronic: Secondary | ICD-10-CM

## 2013-09-20 ENCOUNTER — Encounter: Payer: Self-pay | Admitting: Family

## 2013-09-21 ENCOUNTER — Encounter: Payer: Self-pay | Admitting: Family

## 2013-09-21 ENCOUNTER — Ambulatory Visit (INDEPENDENT_AMBULATORY_CARE_PROVIDER_SITE_OTHER): Payer: Commercial Managed Care - HMO | Admitting: Family

## 2013-09-21 VITALS — BP 113/70 | HR 78 | Resp 16 | Ht 66.5 in | Wt 124.0 lb

## 2013-09-21 DIAGNOSIS — I6529 Occlusion and stenosis of unspecified carotid artery: Secondary | ICD-10-CM | POA: Insufficient documentation

## 2013-09-21 DIAGNOSIS — I739 Peripheral vascular disease, unspecified: Secondary | ICD-10-CM

## 2013-09-21 DIAGNOSIS — I63239 Cerebral infarction due to unspecified occlusion or stenosis of unspecified carotid arteries: Secondary | ICD-10-CM

## 2013-09-21 NOTE — Progress Notes (Signed)
[facility] [place] HISTORY AND PHYSICAL   MRN : [id number]  History of Present Illness:   [name] is a 80 y.o. male patient that Dr. Arbie Cookey has been following for infra-inguinal occlusive disease with patent right to left fem-fem bypass on 10/22/11, no evidence of limb threatening ischemia at his visit 6 months prior.  He had right to left fem-fem bypass in 2013 and had occlusion of this after cardiac catheterization in June 2013.  Recent vascular lab study revealed ankle arm index stable at 0.46 on the right and 0.38 on the left. This was no significant change since his previous study in January, 2014.  He had a stroke in Feb., 2013 after left hip surgery, then an MI in May, 2013.  Had SVC umbrella placed at some point.  Was walking with walker until he fell and fractured left humerus recently, is using w/c mostly, rarely walks until the last few months, he is walking more, and no longer has claudication symptoms in his left calf, denies non healing wounds. Walks about 2 minutes before left ankle hurts.  He reports that he has a good appetite and likes to drink Boost.  Patient denies non healing ulcers on legs/feet.  Patient reports recent asthma exacerbation, treated with corticosteroids and has resolved.  Pt Diabetic: No  Pt smoker: former smoker, quit 20 years ago  Pt meds include:  Statin :Yes  Betablocker: Yes  ASA: Yes  Other anticoagulants/antiplatelets: Plavix    Current Outpatient Prescriptions  Medication Sig Dispense Refill  . aspirin EC 81 MG tablet Take 81 mg by mouth every evening.       . citalopram (CELEXA) 20 MG tablet Take 20 mg by mouth every morning.       . clopidogrel (PLAVIX) 75 MG tablet Take 75 mg by mouth every morning.       . diltiazem (DILACOR XR) 180 MG 24 hr capsule Take 180 mg by mouth every morning.       . ferrous sulfate 325 (65 FE) MG tablet Take 1 tablet (325 mg total) by mouth 3 (three) times daily with meals.  60  tablet  0  . isosorbide mononitrate (IMDUR) 30 MG 24 hr tablet Take 30 mg by mouth every morning.       . lactose free nutrition (BOOST) LIQD Take 237 mLs by mouth 3 (three) times daily. Chocolate.      . Loratadine 10 MG CAPS       . metoprolol (LOPRESSOR) 100 MG tablet Take 100 mg by mouth 2 (two) times daily.      . Multiple Vitamin (MULTIVITAMIN WITH MINERALS) TABS tablet Take 1 tablet by mouth every morning.       . nitroGLYCERIN (NITROSTAT) 0.4 MG SL tablet Place 0.4 mg under the tongue every 5 (five) minutes as needed for chest pain.      . pantoprazole (PROTONIX) 40 MG tablet Take 40 mg by mouth every morning.       . polyethylene glycol (MIRALAX / GLYCOLAX) packet Take 17 g by mouth daily as needed for moderate constipation.      . simvastatin (ZOCOR) 10 MG tablet Take 10 mg by mouth every evening.       . Tamsulosin HCl (FLOMAX) 0.4 MG CAPS Take 0.4 mg by mouth every morning.       . VENTOLIN HFA 108 (90 BASE) MCG/ACT inhaler Inhale 2 puffs into the lungs every 4 (four) hours as needed for wheezing or shortness  of breath.       . vitamin C (ASCORBIC ACID) 500 MG tablet Take 500 mg by mouth every morning.       . zinc sulfate 220 MG capsule Take 220 mg by mouth every morning.       No current facility-administered medications for this visit.    Past Medical History  Diagnosis Date  . Asthma   . Hypertension   . Pulmonary embolism   . Peripheral artery disease   . Hypercholesteremia   . Bursitis of right shoulder   . DVT of leg (deep venous thrombosis)   . Cough   . Trouble swallowing   . Change in voice   . Difficulty urinating   . Easy bruising   . CAD (coronary artery disease) 10/02/2011    Severe 2 Vessel CAD with early Mid LAD 95% focal lesion followed by 60-70% irregular stenoses just after bifurcating SP1 and AV Groove Cx-OM1/OM2 bifurcation 90% lesion  . Stroke Feb. 2013  . Pneumonia Feb. 2013  . Myocardial infarction Sep 26, 2011  . Fall at home Sept. 2, 2014     Pt fell down 2 stairs  . COPD (chronic obstructive pulmonary disease) Sep 11, 2013    slight touch    Past Surgical History  Procedure Laterality Date  . Hernia repair    . Appendectomy    . Femoral-femoral bypass graft    . Iliac artery stent    . Esophagogastroduodenoscopy  07/03/2011    Procedure: ESOPHAGOGASTRODUODENOSCOPY (EGD);  Surgeon: Shirley Friar, MD;  Location: [facility];  Service: Endoscopy;  Laterality: N/A;  . Compression hip screw  06/26/2011    Procedure: COMPRESSION HIP;  Surgeon: Raymon Mutton, MD;  Location: [facility];  Service: Orthopedics;  Laterality: Right;  . Laparoscopy  07/10/2011    Procedure: LAPAROSCOPY DIAGNOSTIC;  Surgeon: Ardeth Sportsman, MD;  Location: [facility];  Service: General;  Laterality: N/A;  serousal repair,  . Esophagogastroduodenoscopy  09/28/2011    Procedure: ESOPHAGOGASTRODUODENOSCOPY (EGD);  Surgeon: Willis Modena, MD;  Location: [facility];  Service: Endoscopy;  Laterality: N/A;  . Femoral-femoral bypass graft  10/05/2011    Procedure: BYPASS GRAFT FEMORAL-FEMORAL ARTERY;  Surgeon: Larina Earthly, MD;  Location: [facility] OR;  Service: Vascular;  Laterality: Bilateral;  Thrombectomy of Femoral-Femoral artery bypass graft  . Fracture surgery  06/24/11    Right Hip  . Joint replacement  06/24/11    Right Hip  . Small intestine surgery  March 2013  . Ercp N/A 04/20/2013    Procedure: ENDOSCOPIC RETROGRADE CHOLANGIOPANCREATOGRAPHY (ERCP);  Surgeon: Petra Kuba, MD;  Location: [facility];  Service: Endoscopy;  Laterality: N/A;  . Esophagogastroduodenoscopy N/A 05/06/2013    Procedure: ESOPHAGOGASTRODUODENOSCOPY (EGD);  Surgeon: Vertell Novak., MD;  Location: [facility];  Service: Endoscopy;  Laterality: N/A;    Social History History  Substance Use Topics  . Smoking status: Former Smoker    Quit date: 08/20/1993  . Smokeless tobacco: Never Used  . Alcohol Use: No    Family History Family History  Problem Relation Age of Onset  .  Hyperlipidemia Father   . Deep vein thrombosis Father     No Known Allergies   REVIEW OF SYSTEMS: See HPI for pertinent positives and negatives.  Physical Examination Filed Vitals:   09/21/13 0946 09/21/13 0950  BP: 106/66 113/70  Pulse: 76 78  Resp:  16  Height:  5' 6.5" (1.689 m)  Weight:  124 lb (56.246 kg)  SpO2:  96%   Body mass index is 19.72 kg/(m^2).  General: A&O x 3, WDWN, thin.  Gait: in w/c  Eyes: PERRLA,  Pulmonary: diminished air movement all fields, without wheezes , rales in left base  Cardiac: regular Rythm , without detected murmur  Carotid Bruits  Left  Right    Negative  Negative   Aorta: is not palpable  Radial pulses: 2+ and equal.  VASCULAR EXAM:  Extremities without ischemic changes  without Gangrene; without open wounds.  LE Pulses  LEFT  RIGHT   POPLITEAL  not palpable  not palpable   POSTERIOR TIBIAL  not palpable  not palpable   DORSALIS PEDIS  ANTERIOR TIBIAL  not palpable  not palpable    Abdomen: soft, NT, no masses.  Skin: no rashes, no ulcers noted.  Musculoskeletal: no muscle wasting or atrophy.  Neurologic: A&O X 3; Appropriate Affect ; SENSATION: normal; MOTOR FUNCTION: moving all extremities equally, motor strength43/5 right upper and lower extremities, 3/5 left upper and lower extremities. Speech is fluent/normal. CN 2-12 except for left facial asymmetry with smile.   Non-Invasive Vascular Imaging (09/07/13):  ABI's: Right: 0.60, monophasic, Left: 0.36, monophasic Previous (02/28/13) ABI's: Right: 0.45, Left: 0.24  ASSESSMENT:  [name] is a 80 y.o. male followed for infra-inguinal occlusive disease with patent right to left fem-fem bypass on 10/22/11, no evidence of limb threatening ischemia today, in fact his ABI's have improved, he no longer has claudication in his left calf with walking, and he is walking more. He does not have limb threatening ischemia, and since he has multiple severe co morbidities, surgical  intervention should not be contemplated unless he develops a non-healing wound or limb threatening ischemia.  Daily chair exercises for his legs are recommended and demonstrated to patient and his son to encourage improved arterial circulation to legs and feet. He has improved strength since his last visit.   PLAN:   Continue chair exercises daily for legs. Based on today's exam and non-invasive vascular lab results, the patient will follow up in 6 months for carotid Duplex and ABI's. I discussed in depth with the patient the nature of atherosclerosis, and emphasized the importance of maximal medical management including strict control of blood pressure, blood glucose, and lipid levels, obtaining regular exercise, and cessation of smoking.  The patient is aware that without maximal medical management the underlying atherosclerotic disease process will progress, limiting the benefit of any interventions. The patient was given information about stroke prevention and what symptoms should prompt the patient to seek immediate medical care.  The patient was given information about PAD including signs, symptoms, treatment, what symptoms should prompt the patient to seek immediate medical care, and risk reduction measures to take. Thank you for allowing us to participate in this patient's care.  Charisse MarchSuzanne Nickel, RN, MSN, FNP-C [facility] Office: [phone]  [facility] 09/21/2013 10:05 AM

## 2013-09-21 NOTE — Patient Instructions (Signed)
Peripheral Vascular Disease Peripheral Vascular Disease (PVD), also called Peripheral Arterial Disease (PAD), is a circulation problem caused by cholesterol (atherosclerotic plaque) deposits in the arteries. PVD commonly occurs in the lower extremities (legs) but it can occur in other areas of the body, such as your arms. The cholesterol buildup in the arteries reduces blood flow which can cause pain and other serious problems. The presence of PVD can place a person at risk for Coronary Artery Disease (CAD).  CAUSES  Causes of PVD can be many. It is usually associated with more than one risk factor such as:   High Cholesterol.  Smoking.  Diabetes.  Lack of exercise or inactivity.  High blood pressure (hypertension).  Obesity.  Family history. SYMPTOMS   When the lower extremities are affected, patients with PVD may experience:  Leg pain with exertion or physical activity. This is called INTERMITTENT CLAUDICATION. This may present as cramping or numbness with physical activity. The location of the pain is associated with the level of blockage. For example, blockage at the abdominal level (distal abdominal aorta) may result in buttock or hip pain. Lower leg arterial blockage may result in calf pain.  As PVD becomes more severe, pain can develop with less physical activity.  In people with severe PVD, leg pain may occur at rest.  Other PVD signs and symptoms:  Leg numbness or weakness.  Coldness in the affected leg or foot, especially when compared to the other leg.  A change in leg color.  Patients with significant PVD are more prone to ulcers or sores on toes, feet or legs. These may take longer to heal or may reoccur. The ulcers or sores can become infected.  If signs and symptoms of PVD are ignored, gangrene may occur. This can result in the loss of toes or loss of an entire limb.  Not all leg pain is related to PVD. Other medical conditions can cause leg pain such  as:  Blood clots (embolism) or Deep Vein Thrombosis.  Inflammation of the blood vessels (vasculitis).  Spinal stenosis. DIAGNOSIS  Diagnosis of PVD can involve several different types of tests. These can include:  Pulse Volume Recording Method (PVR). This test is simple, painless and does not involve the use of X-rays. PVR involves measuring and comparing the blood pressure in the arms and legs. An ABI (Ankle-Brachial Index) is calculated. The normal ratio of blood pressures is 1. As this number becomes smaller, it indicates more severe disease.  < 0.95  indicates significant narrowing in one or more leg vessels.  <0.8 there will usually be pain in the foot, leg or buttock with exercise.  <0.4 will usually have pain in the legs at rest.  <0.25  usually indicates limb threatening PVD.  Doppler detection of pulses in the legs. This test is painless and checks to see if you have a pulses in your legs/feet.  A dye or contrast material (a substance that highlights the blood vessels so they show up on x-ray) may be given to help your caregiver better see the arteries for the following tests. The dye is eliminated from your body by the kidney's. Your caregiver may order blood work to check your kidney function and other laboratory values before the following tests are performed:  Magnetic Resonance Angiography (MRA). An MRA is a picture study of the blood vessels and arteries. The MRA machine uses a large magnet to produce images of the blood vessels.  Computed Tomography Angiography (CTA). A CTA is a   specialized x-ray that looks at how the blood flows in your blood vessels. An IV may be inserted into your arm so contrast dye can be injected.  Angiogram. Is a procedure that uses x-rays to look at your blood vessels. This procedure is minimally invasive, meaning a small incision (cut) is made in your groin. A small tube (catheter) is then inserted into the artery of your groin. The catheter is  guided to the blood vessel or artery your caregiver wants to examine. Contrast dye is injected into the catheter. X-rays are then taken of the blood vessel or artery. After the images are obtained, the catheter is taken out. TREATMENT  Treatment of PVD involves many interventions which may include:  Lifestyle changes:  Quitting smoking.  Exercise.  Following a low fat, low cholesterol diet.  Control of diabetes.  Foot care is very important to the PVD patient. Good foot care can help prevent infection.  Medication:  Cholesterol-lowering medicine.  Blood pressure medicine.  Anti-platelet drugs.  Certain medicines may reduce symptoms of Intermittent Claudication.  Interventional/Surgical options:  Angioplasty. An Angioplasty is a procedure that inflates a balloon in the blocked artery. This opens the blocked artery to improve blood flow.  Stent Implant. A wire mesh tube (stent) is placed in the artery. The stent expands and stays in place, allowing the artery to remain open.  Peripheral Bypass Surgery. This is a surgical procedure that reroutes the blood around a blocked artery to help improve blood flow. This type of procedure may be performed if Angioplasty or stent implants are not an option. SEEK IMMEDIATE MEDICAL CARE IF:   You develop pain or numbness in your arms or legs.  Your arm or leg turns cold, becomes blue in color.  You develop redness, warmth, swelling and pain in your arms or legs. MAKE SURE YOU:   Understand these instructions.  Will watch your condition.  Will get help right away if you are not doing well or get worse. Document Released: 05/28/2004 Document Revised: 07/13/2011 Document Reviewed: 04/24/2008 ExitCare Patient Information 2014 [employer].   Stroke Prevention Some medical conditions and behaviors are associated with an increased chance of having a stroke. You may prevent a stroke by making healthy choices and managing medical  conditions. HOW CAN I REDUCE MY RISK OF HAVING A STROKE?   Stay physically active. Get at least 30 minutes of activity on most or all days.  Do not smoke. It may also be helpful to avoid exposure to secondhand smoke.  Limit alcohol use. Moderate alcohol use is considered to be:  No more than 2 drinks per day for men.  No more than 1 drink per day for nonpregnant women.  Eat healthy foods. This involves  Eating 5 or more servings of fruits and vegetables a day.  Following a diet that addresses high blood pressure (hypertension), high cholesterol, diabetes, or obesity.  Manage your cholesterol levels.  A diet low in saturated fat, trans fat, and cholesterol and high in fiber may control cholesterol levels.  Take any prescribed medicines to control cholesterol as directed by your health care provider.  Manage your diabetes.  A controlled-carbohydrate, controlled-sugar diet is recommended to manage diabetes.  Take any prescribed medicines to control diabetes as directed by your health care provider.  Control your hypertension.  A low-salt (sodium), low-saturated fat, low-trans fat, and low-cholesterol diet is recommended to manage hypertension.  Take any prescribed medicines to control hypertension as directed by your health care provider.    Maintain a healthy weight.  A reduced-calorie, low-sodium, low-saturated fat, low-trans fat, low-cholesterol diet is recommended to manage weight.  Stop drug abuse.  Avoid taking birth control pills.  Talk to your health care provider about the risks of taking birth control pills if you are over 35 years old, smoke, get migraines, or have ever had a blood clot.  Get evaluated for sleep disorders (sleep apnea).  Talk to your health care provider about getting a sleep evaluation if you snore a lot or have excessive sleepiness.  Take medicines as directed by your health care provider.  For some people, aspirin or blood thinners  (anticoagulants) are helpful in reducing the risk of forming abnormal blood clots that can lead to stroke. If you have the irregular heart rhythm of atrial fibrillation, you should be on a blood thinner unless there is a good reason you cannot take them.  Understand all your medicine instructions.  Make sure that other other conditions (such as anemia or atherosclerosis) are addressed. SEEK IMMEDIATE MEDICAL CARE IF:   You have sudden weakness or numbness of the face, arm, or leg, especially on one side of the body.  Your face or eyelid droops to one side.  You have sudden confusion.  You have trouble speaking (aphasia) or understanding.  You have sudden trouble seeing in one or both eyes.  You have sudden trouble walking.  You have dizziness.  You have a loss of balance or coordination.  You have a sudden, severe headache with no known cause.  You have new chest pain or an irregular heartbeat. Any of these symptoms may represent a serious problem that is an emergency. Do not wait to see if the symptoms will go away. Get medical help at once. Call your local emergency services  (911 in [place] Do not drive yourself to the hospital. Document Released: 05/28/2004 Document Revised: 02/08/2013 Document Reviewed: 10/21/2012 Exit[employer] Patient Information 2014 [employer].  

## 2013-09-21 NOTE — Addendum Note (Signed)
Addended by: Sharee Pimple on: 09/21/2013 02:03 PM   Modules accepted: Orders

## 2013-10-09 IMAGING — CR DG HIP COMPLETE 2+V*R*
4 series · 4 of 4 positions shown · non-contrast
Comparison: None.

CLINICAL DATA: 77-year-old male with fall and hip pain.

RIGHT HIP - COMPLETE 2+ VIEW

[x pelvis (1 of 2)]
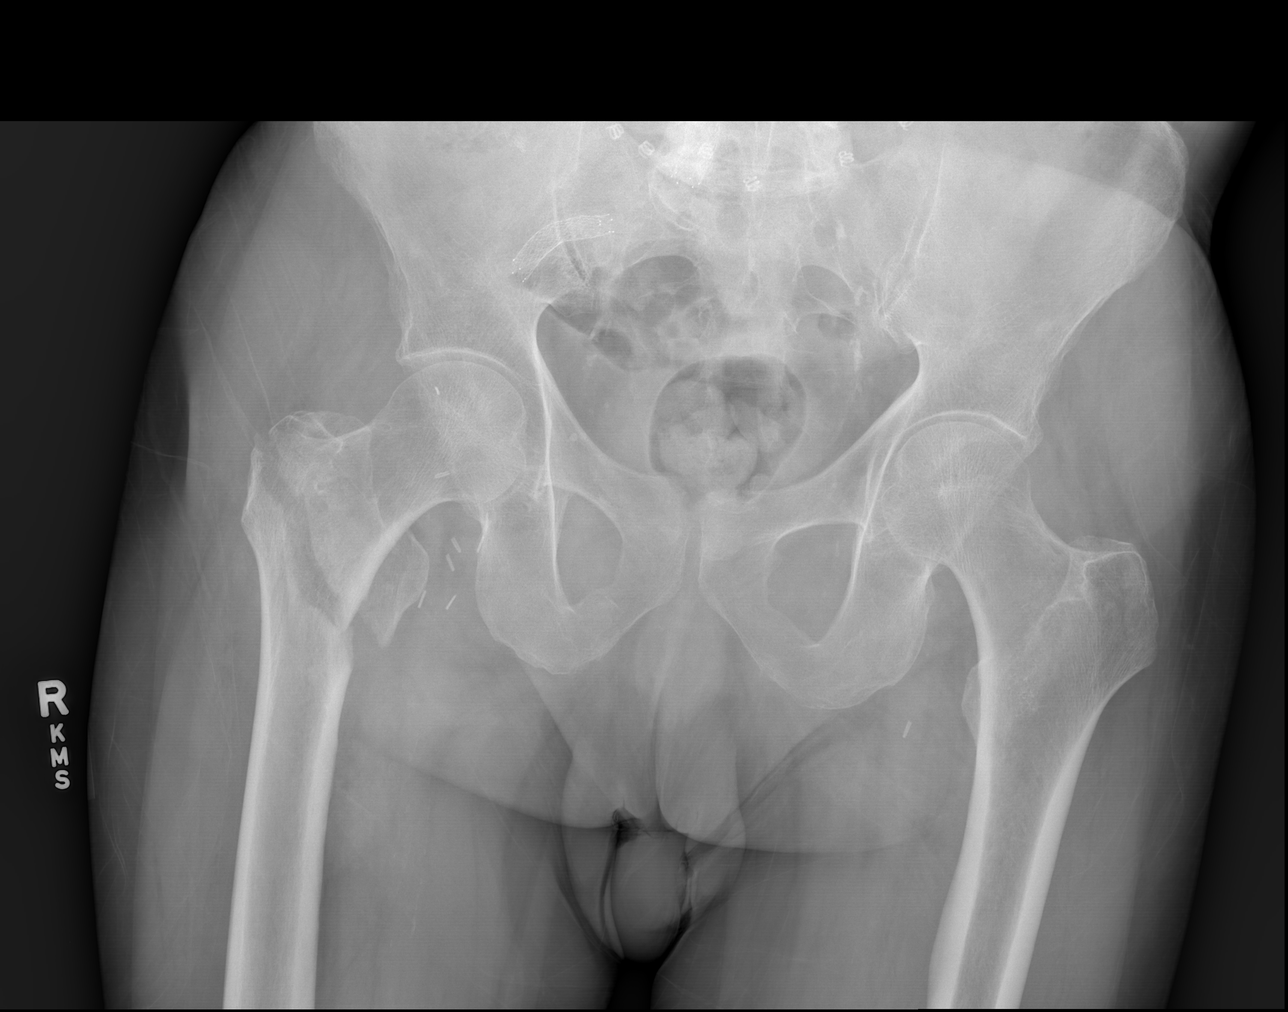

[x pelvis (2 of 2)]
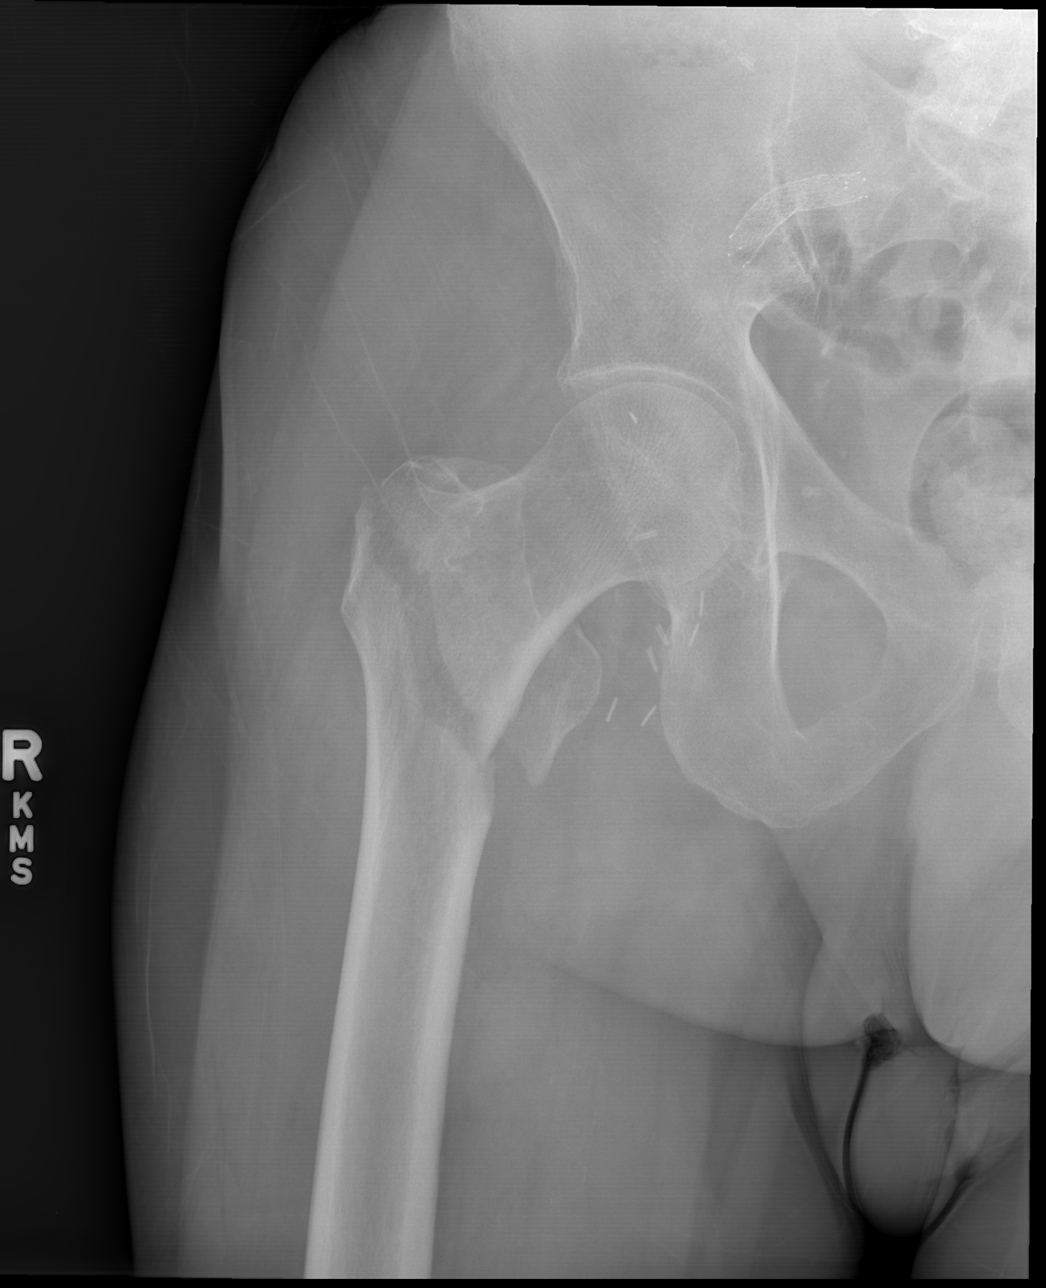

[w hip lat right (1 of 2)]
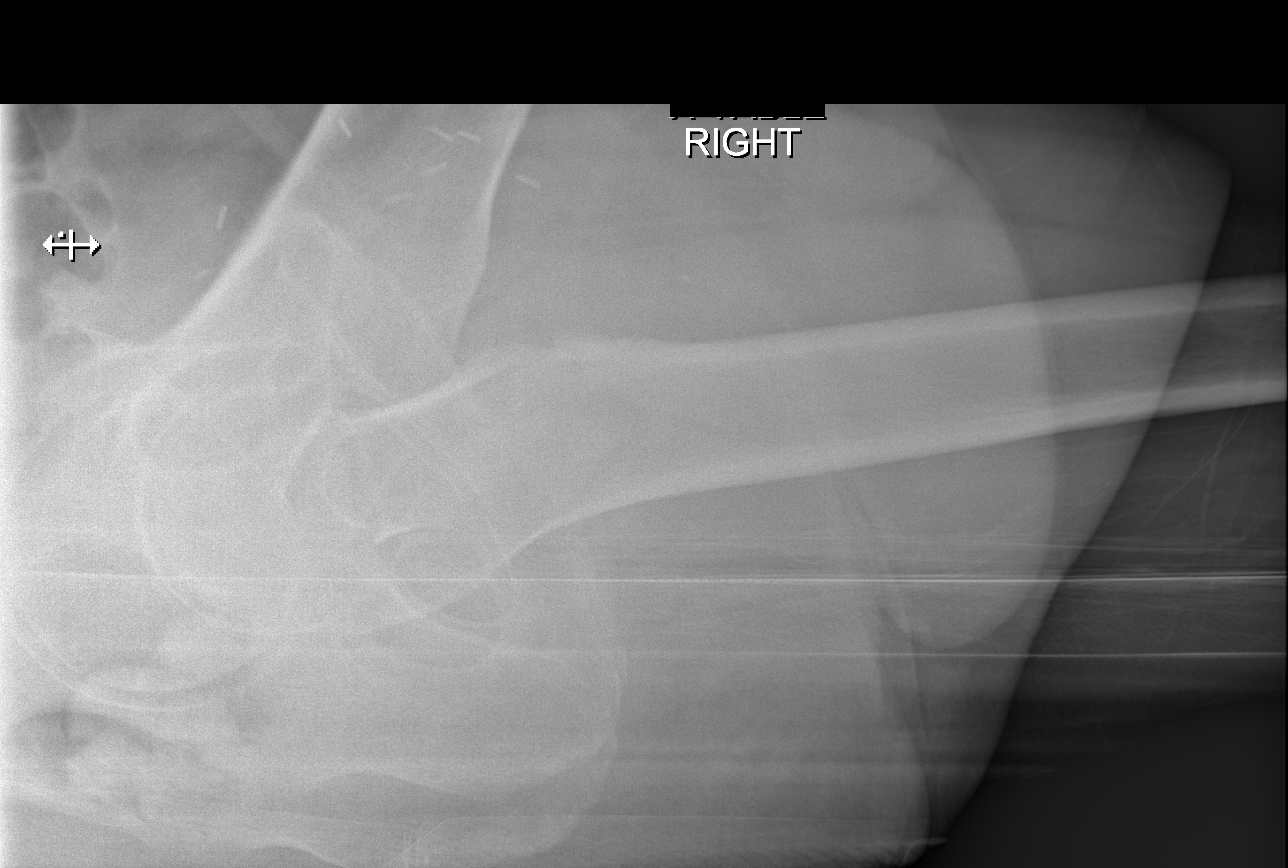

[w hip lat right (2 of 2)]
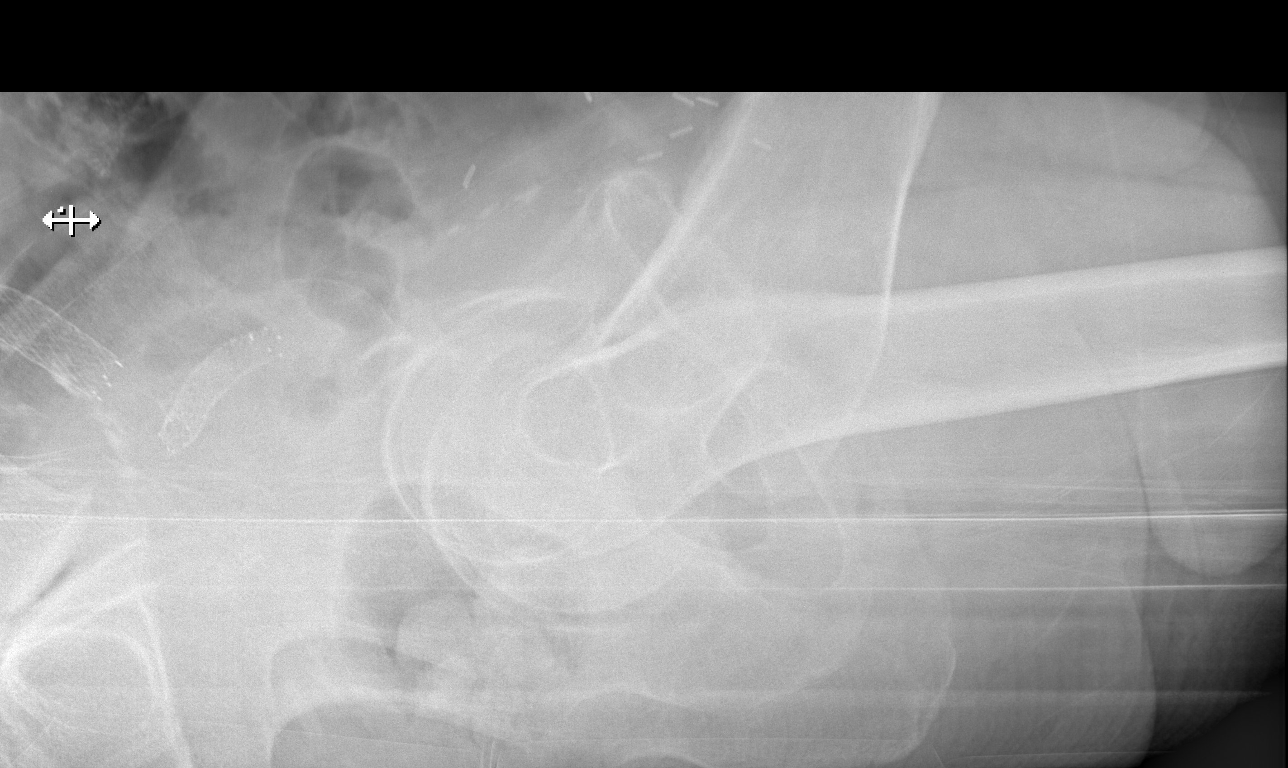

[4 of 4 positions shown; findings below may reference images not displayed]

FINDINGS: Comminuted intertrochanteric fracture of the right femur.
Right femoral head and neck appear to remain intact.  Mild
distraction and slight valgus angulation.

The pelvis appears intact.  Postoperative changes to both inguinal
regions.  Right iliac stent.  Sequelae of abdominal hernia repair.
Proximal left femur grossly intact.
IMPRESSION: Comminuted right femur intertrochanteric fracture with mild
distraction and valgus angulation.

## 2013-12-01 ENCOUNTER — Other Ambulatory Visit (HOSPITAL_COMMUNITY): Payer: Self-pay | Admitting: Cardiovascular Disease

## 2013-12-01 ENCOUNTER — Telehealth (HOSPITAL_COMMUNITY): Payer: Self-pay | Admitting: *Deleted

## 2013-12-01 DIAGNOSIS — I739 Peripheral vascular disease, unspecified: Secondary | ICD-10-CM

## 2013-12-14 ENCOUNTER — Encounter: Payer: Self-pay | Admitting: Internal Medicine

## 2013-12-14 ENCOUNTER — Ambulatory Visit (INDEPENDENT_AMBULATORY_CARE_PROVIDER_SITE_OTHER): Payer: Commercial Managed Care - HMO | Admitting: Internal Medicine

## 2013-12-14 VITALS — BP 112/66 | HR 76 | Temp 97.5°F | Ht 66.0 in | Wt 136.0 lb

## 2013-12-14 DIAGNOSIS — I10 Essential (primary) hypertension: Secondary | ICD-10-CM

## 2013-12-14 DIAGNOSIS — J449 Chronic obstructive pulmonary disease, unspecified: Secondary | ICD-10-CM

## 2013-12-14 MED ORDER — ALBUTEROL SULFATE 108 (90 BASE) MCG/ACT IN AEPB: 1.0000 | INHALATION_SPRAY | RESPIRATORY_TRACT | Status: AC | PRN

## 2013-12-14 MED ORDER — FLUTICASONE FUROATE-VILANTEROL 100-25 MCG/INH IN AEPB: 1.0000 | INHALATION_SPRAY | Freq: Every morning | RESPIRATORY_TRACT | Status: DC

## 2013-12-14 MED ORDER — PREDNISONE 10 MG PO TABS: ORAL_TABLET | ORAL | Status: DC

## 2013-12-14 MED ORDER — NEBIVOLOL HCL 10 MG PO TABS: ORAL_TABLET | ORAL | Status: DC

## 2013-12-14 NOTE — Assessment & Plan Note (Signed)
In setting of increase B agonist dependency in copd, Strongly prefer in this setting: Bystolic, the most beta -1  selective Beta blocker available in sample form, with bisoprolol the most selective generic choice  on the market.   Try bystolic 10 mg samples bid

## 2013-12-14 NOTE — Patient Instructions (Signed)
Start breo take two drags off one click each am  Only use your albuterol (PROAIR/RED cap)  as a rescue medication to be used if you can't catch your breath by resting or doing a relaxed purse lip breathing pattern.  - The less you use it, the better it will work when you need it. - Ok to use up to 2 puffs  every 4 hours if you must but call for immediate appointment if use goes up over your usual need - Don't leave home without it !!  (think of it like the spare tire for your car)   Prednisone 10 mg take  4 each am x 2 days,   2 each am x 2 days,  1 each am x 2 days and stop  Stop lopressor and take bystolic 10 mg twice daily in its place  Please schedule a follow up office visit in 2 weeks, sooner if needed

## 2013-12-14 NOTE — Progress Notes (Signed)
   Subjective:    Patient ID: [name], male    DOB: 1933-06-09  MRN: [id number]  HPI  65 yobm smoker quit  1990 asthma variably short or breath since then not requiring maint medications with spells year round discharged from the [employer] with dx of asthma pattern continued but not as bad until around 2013 then about winter 2015 really really much worse while on high dose lopressor referred 12/14/2013 to pulmonary clinic by Dr Nehemiah Settle for eval of copd   12/14/2013 1st [name]/ [name] on lopressor 100 bid  Chief Complaint  Patient presents with  . Pulmonary Consult    Referred by Dr. Renford Dills for asthma. Pt c/o DOE, lack of energy and wheezing. Pt states he uses his rescue inhaler 10 times per day. Pt c/o prod cough with off white mucous. Pt states s/s have been occuring for 8-9 months. Pt denies CP.   extremely sedentary, very poor hfa technique - used dpi saba x4  puffs this am but didn't know there is only one dose per click.  No obvious pattern in day to day or daytime variabilty or assoc chronic cough or cp or chest tightness, subjective wheeze overt sinus or hb symptoms. No unusual exp hx or h/o childhood pna/ asthma or knowledge of premature birth.  Sleeping ok without nocturnal  or early am exacerbation  of respiratory  c/o's or need for noct saba. Also denies any obvious fluctuation of symptoms with weather or environmental changes or other aggravating or alleviating factors except as outlined above   Current Medications, Allergies, Complete Past Medical History, Past Surgical History, Family History, and Social History were reviewed in [employer] record.     Review of Systems  Constitutional: Negative for fever and unexpected weight change.  HENT: Positive for congestion and rhinorrhea. Negative for dental problem, ear pain, nosebleeds, postnasal drip, sinus pressure, sneezing, sore throat and trouble swallowing.   Eyes: Negative for  redness and itching.  Respiratory: Positive for cough and shortness of breath. Negative for chest tightness and wheezing.   Cardiovascular: Negative for palpitations and leg swelling.  Gastrointestinal: Negative for nausea and vomiting.  Genitourinary: Negative for dysuria.  Musculoskeletal: Negative for joint swelling.  Skin: Negative for rash.  Neurological: Negative for headaches.  Hematological: Does not bruise/bleed easily.  Psychiatric/Behavioral: Negative for dysphoric mood. The patient is not nervous/anxious.        Objective:   Physical Exam  Wt Readings from Last 3 Encounters:  12/14/13 136 lb (61.689 kg)  09/21/13 124 lb (56.246 kg)  08/01/13 128 lb (58.06 kg)      W/c bound bm nad  At rest  HEENT mild turbinate edema.  Oropharynx no thrush or excess pnd or cobblestoning.  No JVD or cervical adenopathy. Mild accessory muscle hypertrophy. Trachea midline, nl thryroid. Chest was hyperinflated by percussion with diminished breath sounds and moderate increased exp time with distant bilateral mid exp wheeze. Hoover sign positive at mid inspiration. Regular rate and rhythm without murmur gallop or rub or increase P2 or edema.  Abd: no hsm, nl excursion. Ext warm without cyanosis or clubbing.       cxr  09/12/13  Emphysema. Chronic change at the right lung base. No definite active  process      Assessment & Plan:

## 2013-12-15 ENCOUNTER — Ambulatory Visit (HOSPITAL_COMMUNITY)
Admission: RE | Admit: 2013-12-15 | Discharge: 2013-12-15 | Disposition: A | Discharge status: Home / Self Care | Payer: Medicare HMO | Source: Ambulatory Visit | Attending: Cardiovascular Disease | Admitting: Cardiovascular Disease

## 2013-12-15 ENCOUNTER — Encounter (HOSPITAL_COMMUNITY): Payer: Commercial Managed Care - HMO

## 2013-12-15 DIAGNOSIS — I739 Peripheral vascular disease, unspecified: Secondary | ICD-10-CM | POA: Diagnosis not present

## 2013-12-15 NOTE — Progress Notes (Signed)
Lower Extremity Arterial Duplex Completed. °Brianna L Mazza,RVT °

## 2013-12-21 ENCOUNTER — Encounter: Payer: Self-pay | Admitting: *Deleted

## 2013-12-27 ENCOUNTER — Ambulatory Visit (INDEPENDENT_AMBULATORY_CARE_PROVIDER_SITE_OTHER): Payer: Commercial Managed Care - HMO | Admitting: Internal Medicine

## 2013-12-27 ENCOUNTER — Encounter: Payer: Self-pay | Admitting: Internal Medicine

## 2013-12-27 VITALS — BP 108/60 | HR 78 | Temp 98.0°F | Ht 66.0 in | Wt 136.0 lb

## 2013-12-27 DIAGNOSIS — J449 Chronic obstructive pulmonary disease, unspecified: Secondary | ICD-10-CM

## 2013-12-27 DIAGNOSIS — I10 Essential (primary) hypertension: Secondary | ICD-10-CM

## 2013-12-27 MED ORDER — FLUTICASONE FUROATE-VILANTEROL 100-25 MCG/INH IN AEPB: 1.0000 | INHALATION_SPRAY | Freq: Every morning | RESPIRATORY_TRACT | Status: AC

## 2013-12-27 MED ORDER — BISOPROLOL FUMARATE 5 MG PO TABS: 5.0000 mg | ORAL_TABLET | Freq: Every day | ORAL | Status: AC

## 2013-12-27 NOTE — Progress Notes (Signed)
Subjective:    Patient ID: [name], male    DOB: Oct 23, 1933  MRN: [id number]   Brief patient profile:  47 yobm smoker quit  1990 asthma variably short or breath since then not requiring maint medications with spells year round discharged from the [employer] with dx of asthma pattern continued but not as bad until around 2013 then about winter 2015 really really much worse while on high dose lopressor referred 12/14/2013 to pulmonary clinic by Dr Nehemiah Settle for eval of copd - proved to have GOLD II criteria 12/27/2013     History of Present Illness  12/14/2013 1st [facility] office visit/ [name] on lopressor 100 bid  Chief Complaint  Patient presents with  . Pulmonary Consult    Referred by Dr. Renford Dills for asthma. Pt c/o DOE, lack of energy and wheezing. Pt states he uses his rescue inhaler 10 times per day. Pt c/o prod cough with off white mucous. Pt states s/s have been occuring for 8-9 months. Pt denies CP.   extremely sedentary, very poor hfa technique - used dpi saba x4  puffs this am but didn't know there is only one dose per click. Start breo take two drags off one click each am Only use your albuterol (PROAIR/RED cap)   Prednisone 10 mg take  4 each am x 2 days,   2 each am x 2 days,  1 each am x 2 days and stop Stop lopressor and take bystolic 10 mg twice daily in its place   12/27/2013 f/u ov/[name] re: f/u copd GOLD II/ hbp  Chief Complaint  Patient presents with  . Follow-up    Pt states that his breathing is back to his normal baseline. Cough has resolved. No new co' today. Has only had to use albuterol inhaler once since last visit.   room to room ok with walker - legs are the limiting factor now  No obvious day to day or daytime variabilty or assoc chronic cough or cp or chest tightness, subjective wheeze overt sinus or hb symptoms. No unusual exp hx or h/o childhood pna/ asthma or knowledge of premature birth.  Sleeping ok without nocturnal  or early am exacerbation   of respiratory  c/o's or need for noct saba. Also denies any obvious fluctuation of symptoms with weather or environmental changes or other aggravating or alleviating factors except as outlined above   Current Medications, Allergies, Complete Past Medical History, Past Surgical History, Family History, and Social History were reviewed in [employer] record.  ROS  The following are not active complaints unless bolded sore throat, dysphagia, dental problems, itching, sneezing,  nasal congestion or excess/ purulent secretions, ear ache,   fever, chills, sweats, unintended wt loss, pleuritic or exertional cp, hemoptysis,  orthopnea pnd or leg swelling, presyncope, palpitations, heartburn, abdominal pain, anorexia, nausea, vomiting, diarrhea  or change in bowel or urinary habits, change in stools or urine, dysuria,hematuria,  rash, arthralgias, visual complaints, headache, numbness weakness or ataxia or problems with walking or coordination,  change in mood/affect or memory.          .        Objective:   Physical Exam  12/27/2013        136  Wt Readings from Last 3 Encounters:  12/14/13 136 lb (61.689 kg)  09/21/13 124 lb (56.246 kg)  08/01/13 128 lb (58.06 kg)      W/c bound bm nad  At rest  HEENT mild turbinate edema.  Oropharynx no thrush or excess pnd or cobblestoning.  No JVD or cervical adenopathy. Mild accessory muscle hypertrophy. Trachea midline, nl thryroid. Chest was hyperinflated by percussion with diminished breath sounds and moderate increased exp time with distant bilateral mid exp wheeze. Hoover sign positive at mid inspiration. Regular rate and rhythm without murmur gallop or rub or increase P2 or edema.  Abd: no hsm, nl excursion. Ext warm without cyanosis or clubbing.       cxr  09/12/13  Emphysema. Chronic change at the right lung base. No definite active  process      Assessment & Plan:   Outpatient Encounter Prescriptions as of 12/27/2013    Medication Sig  . Albuterol Sulfate (PROAIR RESPICLICK) 108 (90 BASE) MCG/ACT AEPB Inhale 1-2 puffs into the lungs every 4 (four) hours as needed.  . ALPRAZolam (XANAX) 0.25 MG tablet Take 0.25 mg by mouth daily as needed for anxiety.  Marland Kitchen aspirin EC 81 MG tablet Take 81 mg by mouth every evening.   . citalopram (CELEXA) 20 MG tablet Take 20 mg by mouth every morning.   . clopidogrel (PLAVIX) 75 MG tablet Take 75 mg by mouth every morning.   . diltiazem (DILACOR XR) 180 MG 24 hr capsule Take 180 mg by mouth every morning.   . Fluticasone Furoate-Vilanterol (BREO ELLIPTA) 100-25 MCG/INH AEPB Inhale 1 puff into the lungs every morning.  . isosorbide mononitrate (IMDUR) 30 MG 24 hr tablet Take 30 mg by mouth every morning.   . lactose free nutrition (BOOST) LIQD Take 237 mLs by mouth 3 (three) times daily. Chocolate.  . Loratadine 10 MG CAPS   . Multiple Vitamin (MULTIVITAMIN WITH MINERALS) TABS tablet Take 1 tablet by mouth every morning.   . nitroGLYCERIN (NITROSTAT) 0.4 MG SL tablet Place 0.4 mg under the tongue every 5 (five) minutes as needed for chest pain.  . pantoprazole (PROTONIX) 40 MG tablet Take 40 mg by mouth every morning.   . polyethylene glycol (MIRALAX / GLYCOLAX) packet Take 17 g by mouth daily as needed for moderate constipation.  . simvastatin (ZOCOR) 10 MG tablet Take 10 mg by mouth every evening.   . Tamsulosin HCl (FLOMAX) 0.4 MG CAPS Take 0.4 mg by mouth every morning.   . vitamin C (ASCORBIC ACID) 500 MG tablet Take 500 mg by mouth every morning.   . zinc sulfate 220 MG capsule Take 220 mg by mouth every morning.  . [DISCONTINUED] Fluticasone Furoate-Vilanterol (BREO ELLIPTA) 100-25 MCG/INH AEPB Inhale 1 puff into the lungs every morning.  . [DISCONTINUED] nebivolol (BYSTOLIC) 10 MG tablet One twice daily  . bisoprolol (ZEBETA) 5 MG tablet Take 1 tablet (5 mg total) by mouth daily.  . [DISCONTINUED] predniSONE (DELTASONE) 10 MG tablet Take  4 each am x 2 days,   2 each am  x 2 days,  1 each am x 2 days and stop

## 2013-12-27 NOTE — Assessment & Plan Note (Signed)
-    Spirometry 12/27/2013  FEV1  1.23 (51%) ratio 54  Off lopresson and on breo one click each am and no need at all for saba, no noct symptoms or daytime limitations  Therefore Adequate control on present rx, reviewed > no change in rx needed  Unless cheaper alternative needed (eg advair) with option of converting to laba/ics neb but avoid the hfa as likely not to be able to master it based on how he struggled to use the dpi well initially.  Pulmonary f/u is prn

## 2013-12-27 NOTE — Patient Instructions (Addendum)
Continue Breo   take two drags off one click each am - call if your insurance prefers  cheaper alternatives (forumulary restrictions)  Only use your albuterol (PROAIR/RED cap)  as a rescue medication to be used if you can't catch your breath by resting or doing a relaxed purse lip breathing pattern.  - The less you use it, the better it will work when you need it. - Ok to use up to 2 puffs  every 4 hours if you must but call for immediate appointment if use goes up over your usual need - Don't leave home without it !!  (think of it like the spare tire for your car)     Stop   bystolic  And start bisoprolol 5 mg daily   in its place but see Dr Nehemiah Settle w/in 2 weeks for recheck   If you are satisfied with your treatment plan,  let your doctor know and he/she can either refill your medications or you can return here when your prescription runs out.     If in any way you are not 100% satisfied,  please tell us.  If 100% better, tell your friends!  Pulmonary follow up is as needed

## 2013-12-27 NOTE — Assessment & Plan Note (Signed)
DDX of  difficult airways management all start with A and  include Adherence, Ace Inhibitors, Acid Reflux, Active Sinus Disease, Alpha 1 Antitripsin deficiency, Anxiety masquerading as Airways dz,  ABPA,  allergy(esp in young), Aspiration (esp in elderly), Adverse effects of DPI,  Active smokers, plus two Bs  = Bronchiectasis and Beta blocker use..and one C= CHF  Adherence is always the initial "prime suspect" and is a multilayered concern that requires a "trust but verify" approach in every patient - starting with knowing how to use medications, especially inhalers, correctly, keeping up with refills and understanding the fundamental difference between maintenance and prns vs those medications only taken for a very short course and then stopped and not refilled.  The proper method of use, as well as anticipated side effects, of a metered-dose inhaler are discussed and demonstrated to the patient. Improved effectiveness after extensive coaching during this visit to a level of approximately  0% with hfa and 90% with dpi but needed to understand how/ when to use it  ? BB effect > see hbp  ? Allergy > Prednisone 10 mg take  4 each am x 2 days,   2 each am x 2 days,  1 each am x 2 days and stop   ? Asthma vs copd > try Breo 100 2 each am and regroup in 2 weeks

## 2013-12-27 NOTE — Assessment & Plan Note (Signed)
Changed lopressor to bystolic 10 bid trial basis 12/14/2013 > all wheezing resolved so continue selective BB for now but ok to rechallenge if continues to do so well as now and can't afford alternatives to high dose lopressor  Strongly prefer however : Bystolic, the most beta -1  selective Beta blocker available in sample form, with bisoprolol the most selective generic choice  on the market.  (bisoprolol 5 mg daily for now given and f/u with Dr Nehemiah Settle in 2 weeks to fine tune if needed )

## 2014-01-04 ENCOUNTER — Encounter: Payer: Self-pay | Admitting: Cardiology

## 2014-01-04 ENCOUNTER — Ambulatory Visit (INDEPENDENT_AMBULATORY_CARE_PROVIDER_SITE_OTHER): Payer: Commercial Managed Care - HMO | Admitting: Cardiology

## 2014-01-04 VITALS — BP 100/58 | HR 83 | Ht 66.0 in | Wt 136.0 lb

## 2014-01-04 DIAGNOSIS — I2510 Atherosclerotic heart disease of native coronary artery without angina pectoris: Secondary | ICD-10-CM

## 2014-01-04 DIAGNOSIS — M25662 Stiffness of left knee, not elsewhere classified: Secondary | ICD-10-CM

## 2014-01-04 DIAGNOSIS — M25669 Stiffness of unspecified knee, not elsewhere classified: Secondary | ICD-10-CM

## 2014-01-04 DIAGNOSIS — I2699 Other pulmonary embolism without acute cor pulmonale: Secondary | ICD-10-CM

## 2014-01-04 DIAGNOSIS — I739 Peripheral vascular disease, unspecified: Secondary | ICD-10-CM

## 2014-01-04 DIAGNOSIS — I255 Ischemic cardiomyopathy: Secondary | ICD-10-CM

## 2014-01-04 DIAGNOSIS — I2589 Other forms of chronic ischemic heart disease: Secondary | ICD-10-CM

## 2014-01-04 DIAGNOSIS — Z7901 Long term (current) use of anticoagulants: Secondary | ICD-10-CM

## 2014-01-04 MED ORDER — DICLOFENAC SODIUM 1 % TD GEL: 2.0000 g | Freq: Four times a day (QID) | TRANSDERMAL | Status: AC

## 2014-01-04 NOTE — Assessment & Plan Note (Signed)
No angina 

## 2014-01-04 NOTE — Assessment & Plan Note (Signed)
C/O stiffness in his Lt knee

## 2014-01-04 NOTE — Progress Notes (Signed)
01/04/2014 [name]   12/26/1933  [id number]  Primary Physicia [name] Apo, MD Primary Cardiologist: [name]  HPI:  The patient is a pleasant, 80 year-old male who has been followed by [name]. Allyson [name]. He has a history of vascular disease. He had a fem-fem bypass graft about 15 years ago by [name]. Liliane [name]. [name]. Allyson [name] did a right iliac PTA in October 2011. The patient had a small bowel obstruction in February 2013 and had lysis of adhesions. In March 2013 he had a right brain stroke and a pulmonary embolism and bilateral DVT. He was put on Coumadin. He presented with a GI bleed in May 2013. Endoscopy then showed esophagitis and duodenitis. That hospitalization was long and complicated. He had a non-ST-elevation MI and had a catheterization and subsequent circumflex and LAD bare-metal stenting. Because of his GI bleeding an IVC filter was placed and he was taken off Coumadin. He had an occlusion of his fem-fem bypass graft and had to have a thrombectomy during that hospitalization as well. Ultimately, he recovered. He has been doing well from a cardiac standpoint since. He admits he has dyspnea on exertion especially going up stairs but this is not new. He saw [name] who took him of Metoprolol and put him on Bisoprolol. [name] says his dyspnea is much improved but his son has noted his B/P is lower- his systolic B/P is now around 100. [name] denies any syncope or orthostatic symptoms. He has not had angina. His main complaint to me was stiffness in his Lt knee.     Current Outpatient Prescriptions  Medication Sig Dispense Refill  . Albuterol Sulfate (PROAIR RESPICLICK) 108 (90 BASE) MCG/ACT AEPB Inhale 1-2 puffs into the lungs every 4 (four) hours as needed.      . ALPRAZolam (XANAX) 0.25 MG tablet Take 0.25 mg by mouth daily as needed for anxiety.      Marland Kitchen aspirin EC 81 MG tablet Take 81 mg by mouth every evening.       . bisoprolol (ZEBETA) 5 MG tablet Take 1 tablet (5 mg total) by mouth  daily.  30 tablet  11  . citalopram (CELEXA) 20 MG tablet Take 20 mg by mouth every morning.       . clopidogrel (PLAVIX) 75 MG tablet Take 75 mg by mouth every morning.       . diltiazem (DILACOR XR) 180 MG 24 hr capsule Take 180 mg by mouth every morning.       . Fluticasone Furoate-Vilanterol (BREO ELLIPTA) 100-25 MCG/INH AEPB Inhale 1 puff into the lungs every morning.  1 each  11  . isosorbide mononitrate (IMDUR) 30 MG 24 hr tablet Take 30 mg by mouth every morning.       . lactose free nutrition (BOOST) LIQD Take 237 mLs by mouth 3 (three) times daily. Chocolate.      . Loratadine 10 MG CAPS       . Multiple Vitamin (MULTIVITAMIN WITH MINERALS) TABS tablet Take 1 tablet by mouth every morning.       . nitroGLYCERIN (NITROSTAT) 0.4 MG SL tablet Place 0.4 mg under the tongue every 5 (five) minutes as needed for chest pain.      . pantoprazole (PROTONIX) 40 MG tablet Take 40 mg by mouth every morning.       . polyethylene glycol (MIRALAX / GLYCOLAX) packet Take 17 g by mouth daily as needed for moderate constipation.      . simvastatin (ZOCOR)  10 MG tablet Take 10 mg by mouth every evening.       . Tamsulosin HCl (FLOMAX) 0.4 MG CAPS Take 0.4 mg by mouth every morning.       . vitamin C (ASCORBIC ACID) 500 MG tablet Take 500 mg by mouth every morning.       . zinc sulfate 220 MG capsule Take 220 mg by mouth every morning.      . diclofenac sodium (VOLTAREN) 1 % GEL Apply 2 g topically 4 (four) times daily.  100 g  3   No current facility-administered medications for this visit.    No Known Allergies  History   Social History  . Marital Status: Married    Spouse Name: N/A    Number of Children: N/A  . Years of Education: N/A   Occupational History  . retired    Social History Main Topics  . Smoking status: Former Smoker -- 0.50 packs/day for 30 years    Types: Cigarettes    Quit date: 08/20/1988  . Smokeless tobacco: Never Used  . Alcohol Use: No  . Drug Use: No  . Sexual  Activity: No   Other Topics Concern  . Not on file   Social History Narrative  . No narrative on file     Review of Systems: General: negative for chills, fever, night sweats or weight changes.  Cardiovascular: negative for chest pain, dyspnea on exertion, edema, orthopnea, palpitations, paroxysmal nocturnal dyspnea or shortness of breath Dermatological: negative for rash Respiratory: negative for cough or wheezing Urologic: negative for hematuria Abdominal: negative for nausea, vomiting, diarrhea, bright red blood per rectum, melena, or hematemesis Neurologic: negative for visual changes, syncope, or dizziness All other systems reviewed and are otherwise negative except as noted above.    Blood pressure 100/58, pulse 83, height  (1.676 m), weight 136 lb (61.689 kg).  General appearance: alert, cooperative and no distress Lungs: clear to auscultation bilaterally Heart: regular rate and rhythm and soft systolic murmur Extremities: Lt knee may be slighltly swollen  EKG NSR without acute changes  ASSESSMENT AND PLAN:   CAD (coronary artery disease) No angina  NSTEMI, BMS to CFX/LAD  10/03/11 .  Cardiomyopathy, ischemic No CHF symptoms  PVD, thrombosed R-L BPG, S/P thrombectomy 10/05/11 No claudication but overall activity limited to wheel chair  DVT 3/13 .  PE (pulmonary embolism) July 2012, IVC filter placed 10/03/11 .  Chronic anticoagulation, stopped secondary to GI bleeding No history of any recent bleeding  CVA, Rt brain Feb 2013 .  Stiffness of joint, lower leg C/O stiffness in his Lt knee   PLAN  I added Voltaren gel for his knee. If his B/P drops further I would decrease, or stop his Diltiazem. The pt is to see [name] soon and will ask him about this. He can see [name] in 6 months.   [name] KPA-C 01/04/2014 9:53 AM

## 2014-01-04 NOTE — Assessment & Plan Note (Signed)
No claudication but overall activity limited to wheel chair

## 2014-01-04 NOTE — Patient Instructions (Signed)
Voltaren gel was sent into your pharmacy for your knee.    Below 100 systolic, give [place] a call and we can adjust your Diltiazem dosage.  You have a recall in for Dr.Berry in Six months.

## 2014-01-04 NOTE — Assessment & Plan Note (Signed)
No history of any recent bleeding

## 2014-01-04 NOTE — Assessment & Plan Note (Signed)
No CHF symptoms 

## 2014-03-22 ENCOUNTER — Other Ambulatory Visit (HOSPITAL_COMMUNITY): Payer: Commercial Managed Care - HMO

## 2014-03-22 ENCOUNTER — Encounter (HOSPITAL_COMMUNITY): Payer: Commercial Managed Care - HMO

## 2014-03-22 ENCOUNTER — Ambulatory Visit: Payer: Commercial Managed Care - HMO | Admitting: Family

## 2014-04-12 ENCOUNTER — Encounter: Payer: Self-pay | Admitting: Family

## 2014-04-12 ENCOUNTER — Encounter (HOSPITAL_COMMUNITY): Payer: Self-pay | Admitting: Cardiology

## 2014-04-13 ENCOUNTER — Encounter: Payer: Self-pay | Admitting: Family

## 2014-04-13 ENCOUNTER — Ambulatory Visit (HOSPITAL_COMMUNITY)
Admission: RE | Admit: 2014-04-13 | Discharge: 2014-04-13 | Disposition: A | Discharge status: Home / Self Care | Payer: Commercial Managed Care - HMO | Source: Ambulatory Visit | Attending: Family | Admitting: Family

## 2014-04-13 ENCOUNTER — Ambulatory Visit (INDEPENDENT_AMBULATORY_CARE_PROVIDER_SITE_OTHER)
Admission: RE | Admit: 2014-04-13 | Discharge: 2014-04-13 | Disposition: A | Discharge status: Home / Self Care | Payer: Commercial Managed Care - HMO | Source: Ambulatory Visit | Attending: Family | Admitting: Family

## 2014-04-13 ENCOUNTER — Ambulatory Visit (INDEPENDENT_AMBULATORY_CARE_PROVIDER_SITE_OTHER): Payer: Commercial Managed Care - HMO | Admitting: Family

## 2014-04-13 VITALS — BP 139/74 | HR 75 | Resp 16 | Ht 66.0 in | Wt 145.0 lb

## 2014-04-13 DIAGNOSIS — Z48812 Encounter for surgical aftercare following surgery on the circulatory system: Secondary | ICD-10-CM

## 2014-04-13 DIAGNOSIS — I6521 Occlusion and stenosis of right carotid artery: Secondary | ICD-10-CM | POA: Diagnosis not present

## 2014-04-13 DIAGNOSIS — I739 Peripheral vascular disease, unspecified: Secondary | ICD-10-CM

## 2014-04-13 DIAGNOSIS — I6529 Occlusion and stenosis of unspecified carotid artery: Secondary | ICD-10-CM | POA: Diagnosis present

## 2014-04-13 DIAGNOSIS — I6523 Occlusion and stenosis of bilateral carotid arteries: Secondary | ICD-10-CM

## 2014-04-13 NOTE — Patient Instructions (Signed)

## 2014-04-13 NOTE — Progress Notes (Addendum)
[facility]  HISTORY [facility] PHYS[facility]   MRN : [id number]  History of Present Illness:   [name] is a 80 y.o. male patient that Dr. Arbie Cookey has been following for infra-inguinal occlusive disease with patent right to left fem-fem bypass on 10/22/11, no evidence of limb threatening ischemia. He had right to left fem-fem bypass in 2013 and had occlusion of this after cardiac catheterization in June 2013, Dr. Allyson Sabal has been checking bilateral LE arterial Duplex and ABI's yearly.  Recent vascular lab study revealed ankle arm index stable at 0.46 on the right and 0.38 on the left. This was no significant change since his previous study in January, 2014.  He had a stroke in Feb., 2013 after left hip surgery, then an MI in May, 2013. Pt denies any further stroke or TIA activity.  Had IVC umbrella placed at some point, possibly in 2013.  Was walking with walker until he fell and fractured left humerus recently, he is walking more, and no longer has claudication symptoms in his left calf, denies non healing wounds. His left ankle hurts less as time passes, denies ankle injury.  He reports that he has a good appetite and likes to drink Boost.   No recent asthma exacerbation since taking Breo.   Pt Diabetic: No  Pt smoker: former smoker, quit 20 years ago   Pt meds include:  Statin :Yes  Betablocker: Yes  ASA: Yes  Other anticoagulants/antiplatelets: Plavix     Current Outpatient Prescriptions  Medication Sig Dispense Refill  . Albuterol Sulfate (PROAIR RESPICLICK) 108 (90 BASE) MCG/ACT AEPB Inhale 1-2 puffs into the lungs every 4 (four) hours as needed. (Patient taking differently: Inhale 1-2 puffs into the lungs as needed. )    . ALPRAZolam (XANAX) 0.25 MG tablet Take 0.25 mg by mouth daily as needed for anxiety.    Marland Kitchen aspirin EC 81 MG tablet Take 81 mg by mouth every evening.     . bisoprolol (ZEBETA) 5 MG tablet Take 1 tablet (5 mg total) by mouth  daily. 30 tablet 11  . citalopram (CELEXA) 20 MG tablet Take 20 mg by mouth every morning.     . clopidogrel (PLAVIX) 75 MG tablet Take 75 mg by mouth every morning.     . diclofenac sodium (VOLTAREN) 1 % GEL Apply 2 g topically 4 (four) times daily. 100 g 3  . diltiazem (DILACOR XR) 180 MG 24 hr capsule Take 180 mg by mouth every morning.     . Fluticasone Furoate-Vilanterol (BREO ELLIPTA) 100-25 MCG/INH AEPB Inhale 1 puff into the lungs every morning. 1 each 11  . isosorbide mononitrate (IMDUR) 30 MG 24 hr tablet Take 30 mg by mouth every morning.     . lactose free nutrition (BOOST) LIQD Take 237 mLs by mouth 3 (three) times daily. Chocolate.    . Loratadine 10 MG CAPS     . Multiple Vitamin (MULTIVITAMIN WITH MINERALS) TABS tablet Take 1 tablet by mouth every morning.     . nitroGLYCERIN (NITROSTAT) 0.4 MG SL tablet Place 0.4 mg under the tongue every 5 (five) minutes as needed for chest pain.    . pantoprazole (PROTONIX) 40 MG tablet Take 40 mg by mouth every morning.     . polyethylene glycol (MIRALAX / GLYCOLAX) packet Take 17 g by mouth daily as needed for moderate constipation.    . simvastatin (ZOCOR) 10 MG tablet Take 10 mg by mouth every evening.     . Tamsulosin  HCl (FLOMAX) 0.4 MG CAPS Take 0.4 mg by mouth every morning.     . vitamin C (ASCORBIC ACID) 500 MG tablet Take 500 mg by mouth every morning.     . zinc sulfate 220 MG capsule Take 220 mg by mouth every morning.     No current facility-administered medications for this visit.    Past Medical History  Diagnosis Date  . Asthma   . Hypertension   . Pulmonary embolism   . Peripheral artery disease   . Hypercholesteremia   . Bursitis of right shoulder   . DVT of leg (deep venous thrombosis)   . Cough   . Trouble swallowing   . Change in voice   . Difficulty urinating   . Easy bruising   . CAD (coronary artery disease) 10/02/2011    Severe 2 Vessel CAD with early Mid LAD 95% focal lesion followed by 60-70% irregular  stenoses just after bifurcating SP1 and AV Groove Cx-OM1/OM2 bifurcation 90% lesion  . Stroke Feb. 2013  . Pneumonia Feb. 2013  . Myocardial infarction Sep 26, 2011  . Fall at home Sept. 2, 2014    Pt fell down 2 stairs  . COPD (chronic obstructive pulmonary disease) Sep 11, 2013    slight touch    Social History History  Substance Use Topics  . Smoking status: Former Smoker -- 0.50 packs/day for 30 years    Types: Cigarettes    Quit date: 08/20/1988  . Smokeless tobacco: Never Used  . Alcohol Use: No    Family History Family History  Problem Relation Age of Onset  . Hyperlipidemia Father   . Deep vein thrombosis Father     Surgical History Past Surgical History  Procedure Laterality Date  . Hernia repair    . Appendectomy    . Femoral-femoral bypass graft    . Iliac artery stent    . Esophagogastroduodenoscopy  07/03/2011    Procedure: ESOPHAGOGASTRODUODENOSCOPY (EGD);  Surgeon: Shirley Friar, MD;  Location: [facility];  Service: Endoscopy;  Laterality: N/A;  . Compression hip screw  06/26/2011    Procedure: COMPRESSION HIP;  Surgeon: Raymon Mutton, MD;  Location: [facility];  Service: Orthopedics;  Laterality: Right;  . Laparoscopy  07/10/2011    Procedure: LAPAROSCOPY DIAGNOSTIC;  Surgeon: Ardeth Sportsman, MD;  Location: [facility];  Service: General;  Laterality: N/A;  serousal repair,  . Esophagogastroduodenoscopy  09/28/2011    Procedure: ESOPHAGOGASTRODUODENOSCOPY (EGD);  Surgeon: Willis Modena, MD;  Location: [facility];  Service: Endoscopy;  Laterality: N/A;  . Femoral-femoral bypass graft  10/05/2011    Procedure: BYPASS GRAFT FEMORAL-FEMORAL ARTERY;  Surgeon: Larina Earthly, MD;  Location: [facility] OR;  Service: Vascular;  Laterality: Bilateral;  Thrombectomy of Femoral-Femoral artery bypass graft  . Fracture surgery  06/24/11    Right Hip  . Joint replacement  06/24/11    Right Hip  . Small intestine surgery  March 2013  . Ercp N/A 04/20/2013    Procedure: ENDOSCOPIC  RETROGRADE CHOLANGIOPANCREATOGRAPHY (ERCP);  Surgeon: Petra Kuba, MD;  Location: [facility];  Service: Endoscopy;  Laterality: N/A;  . Esophagogastroduodenoscopy N/A 05/06/2013    Procedure: ESOPHAGOGASTRODUODENOSCOPY (EGD);  Surgeon: Vertell Novak., MD;  Location: [facility];  Service: Endoscopy;  Laterality: N/A;  . Left heart catheterization with coronary angiogram N/A 10/02/2011    Procedure: LEFT HEART CATHETERIZATION WITH CORONARY ANGIOGRAM;  Surgeon: Marykay Lex, MD;  Location: [facility];  Service: Cardiovascular;  Laterality: N/A;  No Known Allergies  Current Outpatient Prescriptions  Medication Sig Dispense Refill  . Albuterol Sulfate (PROAIR RESPICLICK) 108 (90 BASE) MCG/ACT AEPB Inhale 1-2 puffs into the lungs every 4 (four) hours as needed. (Patient taking differently: Inhale 1-2 puffs into the lungs as needed. )    . ALPRAZolam (XANAX) 0.25 MG tablet Take 0.25 mg by mouth daily as needed for anxiety.    Marland Kitchen aspirin EC 81 MG tablet Take 81 mg by mouth every evening.     . bisoprolol (ZEBETA) 5 MG tablet Take 1 tablet (5 mg total) by mouth daily. 30 tablet 11  . citalopram (CELEXA) 20 MG tablet Take 20 mg by mouth every morning.     . clopidogrel (PLAVIX) 75 MG tablet Take 75 mg by mouth every morning.     . diclofenac sodium (VOLTAREN) 1 % GEL Apply 2 g topically 4 (four) times daily. 100 g 3  . diltiazem (DILACOR XR) 180 MG 24 hr capsule Take 180 mg by mouth every morning.     . Fluticasone Furoate-Vilanterol (BREO ELLIPTA) 100-25 MCG/INH AEPB Inhale 1 puff into the lungs every morning. 1 each 11  . isosorbide mononitrate (IMDUR) 30 MG 24 hr tablet Take 30 mg by mouth every morning.     . lactose free nutrition (BOOST) LIQD Take 237 mLs by mouth 3 (three) times daily. Chocolate.    . Loratadine 10 MG CAPS     . Multiple Vitamin (MULTIVITAMIN WITH MINERALS) TABS tablet Take 1 tablet by mouth every morning.     . nitroGLYCERIN (NITROSTAT) 0.4 MG SL tablet Place 0.4 mg under  the tongue every 5 (five) minutes as needed for chest pain.    . pantoprazole (PROTONIX) 40 MG tablet Take 40 mg by mouth every morning.     . polyethylene glycol (MIRALAX / GLYCOLAX) packet Take 17 g by mouth daily as needed for moderate constipation.    . simvastatin (ZOCOR) 10 MG tablet Take 10 mg by mouth every evening.     . Tamsulosin HCl (FLOMAX) 0.4 MG CAPS Take 0.4 mg by mouth every morning.     . vitamin C (ASCORBIC ACID) 500 MG tablet Take 500 mg by mouth every morning.     . zinc sulfate 220 MG capsule Take 220 mg by mouth every morning.     No current facility-administered medications for this visit.     REVIEW OF SYSTEMS: See HPI for pertinent positives and negatives.  Physical Examination Filed Vitals:   04/13/14 1104 04/13/14 1107  BP: 119/71 139/74  Pulse: 78 75  Resp:  16  Height:  5\' 6"  (1.676 m)  Weight:  145 lb (65.772 kg)  SpO2:  96%   Body mass index is 23.41 kg/(m^2).  General: A&O x 3, WDWN, thin.  Gait: in w/c  Eyes: PERRLA,  Pulmonary: diminished air movement all fields, without wheezes , rales in left base  Cardiac: regular Rythm , without detected murmur   Carotid Bruits  Left  Right    Negative  Negative   Aorta: is not palpable  Radial pulses: 2+ and equal.   VASCULAR EXAM:  Extremities without ischemic changes  without Gangrene; without open wounds.   LE Pulses  LEFT  RIGHT   POPLITEAL  not palpable  not palpable   POSTERIOR TIBIAL  not palpable  not palpable   DORSALIS PEDIS  ANTERIOR TIBIAL  not palpable  1+ palpable    Abdomen: soft, NT, no masses.  Skin: no rashes, no  ulcers noted.  Musculoskeletal: no muscle wasting or atrophy.  Neurologic: A&O X 3; Appropriate Affect ; SENSATION: normal; MOTOR FUNCTION: moving all extremities equally, motor strength43/5 right upper and lower extremities, 3/5 left upper and lower extremities. Speech is fluent/normal. CN 2-12 except for left facial asymmetry with  smile.    Non-Invasive Vascular Imaging (04/13/2014) :  CEREBROVASCULAR DUPLEX EVALUATION    INDICATION: Carotid artery disease    PREVIOUS INTERVENTION(S): None    DUPLEX EXAM: Carotid duplex    RIGHT  LEFT  Peak Systolic Velocities (cm/s) End Diastolic Velocities (cm/s) Plaque LOCATION Peak Systolic Velocities (cm/s) End Diastolic Velocities (cm/s) Plaque  73 10 - CCA PROXIMAL 150 33 -  71 7 - CCA MID 121 25 -  47 6 - CCA DISTAL 89 23 HT  54 7 - ECA 135 13 HT  Occluded - HT ICA PROXIMAL 159 31 HT  Occluded - HT ICA MID 75 25 -  Occluded - HT ICA DISTAL 81 34 -    N/A ICA / CCA Ratio (PSV) 1.31  Antegrade Vertebral Flow Antegrade  137 Brachial Systolic Pressure (mmHg) 128  Triphasic Brachial Artery Waveforms Triphasic    Plaque Morphology:  HM = Homogeneous, HT = Heterogeneous, CP = Calcific Plaque, SP = Smooth Plaque, IP = Irregular Plaque     ADDITIONAL FINDINGS:     IMPRESSION: 1. Known occluded right internal carotid artery 2. Less than 40% left internal carotid artery stenosis    Compared to the previous exam:  No change     ASSESSMENT:  Abdurahman Rugg is a 80 y.o. male who is s/p right to left fem-fem bypass in 2013 and had occlusion of this after cardiac catheterization in June 2013, Dr. Allyson Sabal has been checking bilateral LE arterial Duplex and ABI's yearly.  Recent vascular lab study revealed ankle arm index stable at 0.46 on the right and 0.38 on the left. This was no significant change since his previous study in January, 2014.  The pt walks with a walker and also gets around in a w/c, he denies claudication symptoms, has no tissue loss.  He had a stroke in Feb., 2013 after left hip surgery, then an MI in May, 2013. Pt denies any further stroke or TIA activity. Carotid Duplex today reveals a known occluded right ICA and <40% left ICA stenosis.  Had IVC umbrella placed at some point, possibly in 2013.   Dr. Allyson Sabal has been ordering yearly LE arterial  bilateral Duplex, will defer to Dr. Allyson Sabal to continue this along with the ABI's. Our office will continue to follow pt's carotid stenosis.     PLAN:   Based on today's exam and non-invasive vascular lab results, the patient will follow up in 1 year with the following tests: carotid Duplex. I discussed in depth with the patient the nature of atherosclerosis, and emphasized the importance of maximal medical management including strict control of blood pressure, blood glucose, and lipid levels, obtaining regular exercise, and cessation of smoking.  The patient is aware that without maximal medical management the underlying atherosclerotic disease process will progress, limiting the benefit of any interventions.  The patient was given information about stroke prevention and what symptoms should prompt the patient to seek immediate medical care.  The patient was given information about PAD including signs, symptoms, treatment, what symptoms should prompt the patient to seek immediate medical care, and risk reduction measures to take. Thank you for allowing [place] to participate in this patient's care.  Rosalita Chessman Nickel,  RN, MSN, FNP-C [facility] Office: [phone]  Clinic MD: Imogene BurnChen  04/13/2014 11:20 AM

## 2014-07-04 ENCOUNTER — Inpatient Hospital Stay (HOSPITAL_COMMUNITY)
Admission: EM | Admit: 2014-07-04 | Discharge: 2014-07-09 | DRG: 481 | Disposition: A | Discharge status: Skilled Nursing Facility | Payer: Commercial Managed Care - HMO | Attending: Internal Medicine | Admitting: Internal Medicine

## 2014-07-04 ENCOUNTER — Other Ambulatory Visit (HOSPITAL_COMMUNITY): Payer: Self-pay

## 2014-07-04 ENCOUNTER — Emergency Department (HOSPITAL_COMMUNITY): Payer: Commercial Managed Care - HMO

## 2014-07-04 ENCOUNTER — Encounter (HOSPITAL_COMMUNITY): Payer: Self-pay | Admitting: Emergency Medicine

## 2014-07-04 DIAGNOSIS — J449 Chronic obstructive pulmonary disease, unspecified: Secondary | ICD-10-CM | POA: Diagnosis present

## 2014-07-04 DIAGNOSIS — I255 Ischemic cardiomyopathy: Secondary | ICD-10-CM | POA: Diagnosis present

## 2014-07-04 DIAGNOSIS — K429 Umbilical hernia without obstruction or gangrene: Secondary | ICD-10-CM | POA: Diagnosis present

## 2014-07-04 DIAGNOSIS — Z7982 Long term (current) use of aspirin: Secondary | ICD-10-CM

## 2014-07-04 DIAGNOSIS — I429 Cardiomyopathy, unspecified: Secondary | ICD-10-CM

## 2014-07-04 DIAGNOSIS — I10 Essential (primary) hypertension: Secondary | ICD-10-CM | POA: Diagnosis present

## 2014-07-04 DIAGNOSIS — I69354 Hemiplegia and hemiparesis following cerebral infarction affecting left non-dominant side: Secondary | ICD-10-CM | POA: Diagnosis not present

## 2014-07-04 DIAGNOSIS — Z8781 Personal history of (healed) traumatic fracture: Secondary | ICD-10-CM

## 2014-07-04 DIAGNOSIS — I2510 Atherosclerotic heart disease of native coronary artery without angina pectoris: Secondary | ICD-10-CM | POA: Diagnosis present

## 2014-07-04 DIAGNOSIS — E785 Hyperlipidemia, unspecified: Secondary | ICD-10-CM | POA: Diagnosis present

## 2014-07-04 DIAGNOSIS — I70201 Unspecified atherosclerosis of native arteries of extremities, right leg: Secondary | ICD-10-CM | POA: Diagnosis present

## 2014-07-04 DIAGNOSIS — I252 Old myocardial infarction: Secondary | ICD-10-CM | POA: Diagnosis not present

## 2014-07-04 DIAGNOSIS — I2699 Other pulmonary embolism without acute cor pulmonale: Secondary | ICD-10-CM | POA: Diagnosis present

## 2014-07-04 DIAGNOSIS — J45909 Unspecified asthma, uncomplicated: Secondary | ICD-10-CM | POA: Diagnosis present

## 2014-07-04 DIAGNOSIS — I639 Cerebral infarction, unspecified: Secondary | ICD-10-CM | POA: Diagnosis present

## 2014-07-04 DIAGNOSIS — Z7952 Long term (current) use of systemic steroids: Secondary | ICD-10-CM | POA: Diagnosis not present

## 2014-07-04 DIAGNOSIS — Z86718 Personal history of other venous thrombosis and embolism: Secondary | ICD-10-CM

## 2014-07-04 DIAGNOSIS — Z7902 Long term (current) use of antithrombotics/antiplatelets: Secondary | ICD-10-CM | POA: Diagnosis not present

## 2014-07-04 DIAGNOSIS — Z96641 Presence of right artificial hip joint: Secondary | ICD-10-CM | POA: Diagnosis present

## 2014-07-04 DIAGNOSIS — I739 Peripheral vascular disease, unspecified: Secondary | ICD-10-CM | POA: Diagnosis present

## 2014-07-04 DIAGNOSIS — Z9889 Other specified postprocedural states: Secondary | ICD-10-CM

## 2014-07-04 DIAGNOSIS — K5900 Constipation, unspecified: Secondary | ICD-10-CM | POA: Diagnosis not present

## 2014-07-04 DIAGNOSIS — W19XXXA Unspecified fall, initial encounter: Secondary | ICD-10-CM | POA: Diagnosis present

## 2014-07-04 DIAGNOSIS — Z86711 Personal history of pulmonary embolism: Secondary | ICD-10-CM | POA: Diagnosis not present

## 2014-07-04 DIAGNOSIS — Z87891 Personal history of nicotine dependence: Secondary | ICD-10-CM

## 2014-07-04 DIAGNOSIS — Z79899 Other long term (current) drug therapy: Secondary | ICD-10-CM

## 2014-07-04 DIAGNOSIS — E780 Pure hypercholesterolemia: Secondary | ICD-10-CM | POA: Diagnosis present

## 2014-07-04 DIAGNOSIS — M25552 Pain in left hip: Secondary | ICD-10-CM | POA: Diagnosis present

## 2014-07-04 DIAGNOSIS — S72009A Fracture of unspecified part of neck of unspecified femur, initial encounter for closed fracture: Secondary | ICD-10-CM | POA: Diagnosis present

## 2014-07-04 DIAGNOSIS — S72142A Displaced intertrochanteric fracture of left femur, initial encounter for closed fracture: Secondary | ICD-10-CM | POA: Diagnosis present

## 2014-07-04 DIAGNOSIS — Z419 Encounter for procedure for purposes other than remedying health state, unspecified: Secondary | ICD-10-CM

## 2014-07-04 DIAGNOSIS — T1490XA Injury, unspecified, initial encounter: Secondary | ICD-10-CM

## 2014-07-04 DIAGNOSIS — S72002A Fracture of unspecified part of neck of left femur, initial encounter for closed fracture: Secondary | ICD-10-CM

## 2014-07-04 LAB — BASIC METABOLIC PANEL
ANION GAP: 8 (ref 5–15)
BUN: 23 mg/dL (ref 6–23)
CALCIUM: 9 mg/dL (ref 8.4–10.5)
CO2: 23 mmol/L (ref 19–32)
Chloride: 109 mmol/L (ref 96–112)
Creatinine, Ser: 1.36 mg/dL — ABNORMAL HIGH (ref 0.50–1.35)
GFR calc Af Amer: 56 mL/min — ABNORMAL LOW (ref >90)
GFR, EST NON AFRICAN AMERICAN: 48 mL/min — AB (ref >90)
Glucose, Bld: 149 mg/dL — ABNORMAL HIGH (ref 70–99)
Potassium: 4 mmol/L (ref 3.5–5.1)
Sodium: 140 mmol/L (ref 135–145)

## 2014-07-04 LAB — CBC WITH DIFFERENTIAL/PLATELET
BASOS ABS: 0 10*3/uL (ref 0.0–0.1)
BASOS PCT: 0 % (ref 0–1)
Eosinophils Absolute: 0.1 10*3/uL (ref 0.0–0.7)
Eosinophils Relative: 1 % (ref 0–5)
HCT: 37.8 % — ABNORMAL LOW (ref 39.0–52.0)
HEMOGLOBIN: 12 g/dL — AB (ref 13.0–17.0)
LYMPHS PCT: 9 % — AB (ref 12–46)
Lymphs Abs: 0.7 10*3/uL (ref 0.7–4.0)
MCH: 27.1 pg (ref 26.0–34.0)
MCHC: 31.7 g/dL (ref 30.0–36.0)
MCV: 85.3 fL (ref 78.0–100.0)
Monocytes Absolute: 0.3 10*3/uL (ref 0.1–1.0)
Monocytes Relative: 4 % (ref 3–12)
NEUTROS PCT: 86 % — AB (ref 43–77)
Neutro Abs: 7 10*3/uL (ref 1.7–7.7)
Platelets: 196 10*3/uL (ref 150–400)
RBC: 4.43 MIL/uL (ref 4.22–5.81)
RDW: 15.4 % (ref 11.5–15.5)
WBC: 8.1 10*3/uL (ref 4.0–10.5)

## 2014-07-04 MED ORDER — DILTIAZEM HCL ER 180 MG PO CP24: 180.0000 mg | ORAL_CAPSULE | Freq: Every morning | ORAL | Status: DC

## 2014-07-04 MED ORDER — HEPARIN SODIUM (PORCINE) 5000 UNIT/ML IJ SOLN
5000.0000 [IU] | Freq: Three times a day (TID) | INTRAMUSCULAR | Status: DC
  Administered 2014-07-05: 5000 [IU] via SUBCUTANEOUS
  Filled 2014-07-04 (×5): qty 1

## 2014-07-04 MED ORDER — SIMVASTATIN 10 MG PO TABS
10.0000 mg | ORAL_TABLET | Freq: Every evening | ORAL | Status: DC
  Administered 2014-07-04: 10 mg via ORAL
  Filled 2014-07-04 (×3): qty 1

## 2014-07-04 MED ORDER — VITAMIN C 500 MG PO TABS
500.0000 mg | ORAL_TABLET | Freq: Every morning | ORAL | Status: DC
  Administered 2014-07-05: 500 mg via ORAL
  Filled 2014-07-04: qty 1

## 2014-07-04 MED ORDER — PANTOPRAZOLE SODIUM 40 MG PO TBEC
40.0000 mg | DELAYED_RELEASE_TABLET | Freq: Every morning | ORAL | Status: DC
  Administered 2014-07-04 – 2014-07-05 (×2): 40 mg via ORAL
  Filled 2014-07-04 (×2): qty 1

## 2014-07-04 MED ORDER — BOOST PO LIQD
237.0000 mL | Freq: Every morning | ORAL | Status: DC
  Filled 2014-07-04 (×2): qty 237

## 2014-07-04 MED ORDER — CITALOPRAM HYDROBROMIDE 20 MG PO TABS
20.0000 mg | ORAL_TABLET | Freq: Every morning | ORAL | Status: DC
  Administered 2014-07-04 – 2014-07-05 (×2): 20 mg via ORAL
  Filled 2014-07-04 (×3): qty 1

## 2014-07-04 MED ORDER — PREDNISONE 10 MG PO TABS
10.0000 mg | ORAL_TABLET | Freq: Every day | ORAL | Status: DC
  Administered 2014-07-05: 10 mg via ORAL
  Filled 2014-07-04 (×2): qty 1

## 2014-07-04 MED ORDER — ISOSORBIDE MONONITRATE ER 30 MG PO TB24: 30.0000 mg | ORAL_TABLET | Freq: Every morning | ORAL | Status: DC

## 2014-07-04 MED ORDER — DILTIAZEM HCL ER 180 MG PO CP24
180.0000 mg | ORAL_CAPSULE | Freq: Every morning | ORAL | Status: DC
  Administered 2014-07-04 – 2014-07-05 (×2): 180 mg via ORAL
  Filled 2014-07-04 (×3): qty 1

## 2014-07-04 MED ORDER — ASPIRIN EC 81 MG PO TBEC: 81.0000 mg | DELAYED_RELEASE_TABLET | Freq: Every evening | ORAL | Status: DC

## 2014-07-04 MED ORDER — MORPHINE SULFATE 2 MG/ML IJ SOLN
2.0000 mg | Freq: Once | INTRAMUSCULAR | Status: AC
  Administered 2014-07-04: 2 mg via INTRAVENOUS
  Filled 2014-07-04: qty 1

## 2014-07-04 MED ORDER — ISOSORBIDE MONONITRATE ER 30 MG PO TB24
30.0000 mg | ORAL_TABLET | Freq: Every morning | ORAL | Status: DC
  Administered 2014-07-04 – 2014-07-05 (×2): 30 mg via ORAL
  Filled 2014-07-04 (×3): qty 1

## 2014-07-04 MED ORDER — HYDROCODONE-ACETAMINOPHEN 5-325 MG PO TABS
1.0000 | ORAL_TABLET | Freq: Four times a day (QID) | ORAL | Status: DC | PRN
  Administered 2014-07-04 – 2014-07-05 (×2): 2 via ORAL
  Filled 2014-07-04 (×2): qty 2

## 2014-07-04 MED ORDER — CLOPIDOGREL BISULFATE 75 MG PO TABS: 75.0000 mg | ORAL_TABLET | Freq: Every morning | ORAL | Status: DC

## 2014-07-04 MED ORDER — BISOPROLOL FUMARATE 5 MG PO TABS
5.0000 mg | ORAL_TABLET | Freq: Every day | ORAL | Status: DC
  Administered 2014-07-04 – 2014-07-05 (×2): 5 mg via ORAL
  Filled 2014-07-04 (×3): qty 1

## 2014-07-04 MED ORDER — METHYLPREDNISOLONE SODIUM SUCC 40 MG IJ SOLR
40.0000 mg | Freq: Once | INTRAMUSCULAR | Status: AC
  Administered 2014-07-04: 40 mg via INTRAVENOUS
  Filled 2014-07-04: qty 1

## 2014-07-04 MED ORDER — MORPHINE SULFATE 4 MG/ML IJ SOLN
4.0000 mg | Freq: Once | INTRAMUSCULAR | Status: AC
  Administered 2014-07-04: 4 mg via INTRAVENOUS
  Filled 2014-07-04: qty 1

## 2014-07-04 MED ORDER — PANTOPRAZOLE SODIUM 40 MG PO TBEC: 40.0000 mg | DELAYED_RELEASE_TABLET | Freq: Every morning | ORAL | Status: DC

## 2014-07-04 MED ORDER — MORPHINE SULFATE 2 MG/ML IJ SOLN
0.5000 mg | INTRAMUSCULAR | Status: DC | PRN
  Administered 2014-07-04 – 2014-07-05 (×4): 0.5 mg via INTRAVENOUS
  Filled 2014-07-04 (×4): qty 1

## 2014-07-04 MED ORDER — ALPRAZOLAM 0.25 MG PO TABS: 0.2500 mg | ORAL_TABLET | Freq: Every day | ORAL | Status: DC | PRN

## 2014-07-04 MED ORDER — TAMSULOSIN HCL 0.4 MG PO CAPS
0.4000 mg | ORAL_CAPSULE | Freq: Every morning | ORAL | Status: DC
  Administered 2014-07-05: 0.4 mg via ORAL
  Filled 2014-07-04: qty 1

## 2014-07-04 MED ORDER — ZINC SULFATE 220 (50 ZN) MG PO CAPS
220.0000 mg | ORAL_CAPSULE | Freq: Every morning | ORAL | Status: DC
  Administered 2014-07-05: 220 mg via ORAL
  Filled 2014-07-04: qty 1

## 2014-07-04 MED ORDER — ADULT MULTIVITAMIN W/MINERALS CH
1.0000 | ORAL_TABLET | Freq: Every morning | ORAL | Status: DC
  Administered 2014-07-05: 1 via ORAL
  Filled 2014-07-04: qty 1

## 2014-07-04 MED ORDER — CITALOPRAM HYDROBROMIDE 20 MG PO TABS: 20.0000 mg | ORAL_TABLET | Freq: Every morning | ORAL | Status: DC

## 2014-07-04 MED ORDER — SODIUM CHLORIDE 0.9 % IV SOLN
INTRAVENOUS | Status: DC
  Administered 2014-07-04: 10:00:00 via INTRAVENOUS

## 2014-07-04 MED ORDER — FLUTICASONE FUROATE-VILANTEROL 100-25 MCG/INH IN AEPB: 1.0000 | INHALATION_SPRAY | Freq: Every morning | RESPIRATORY_TRACT | Status: DC

## 2014-07-04 MED ORDER — POLYETHYLENE GLYCOL 3350 17 G PO PACK
17.0000 g | PACK | Freq: Every day | ORAL | Status: DC | PRN
  Filled 2014-07-04: qty 1

## 2014-07-04 NOTE — Progress Notes (Signed)
Admitted pt to rm 5W08 from [facility] via [facility]link, oriented to room, call bell placed within reach, orders carried out. Assessment done. Will continue to monitor.

## 2014-07-04 NOTE — ED Notes (Addendum)
Per Tat MD give all medications except plavix and aspirin.

## 2014-07-04 NOTE — ED Notes (Signed)
Bed: WA24 Expected date:  Expected time:  Means of arrival:  Comments: ems 

## 2014-07-04 NOTE — Progress Notes (Addendum)
  CARE MANAGEMENT ED NOTE 07/04/2014  Patient:  NOEH, DUFFEK   Account Number:  [id number]  Date Initiated:  07/04/2014  Documentation initiated by:  Edd Arbour  Subjective/Objective Assessment:   81 yr old [facility] thn [place] pt fell at home with left hip injury dx with left hip fx and being seen by Dr Shon Baton for surgery at [facility]     Subjective/Objective Assessment Detail:   pcp Windy Fast polite  Pt states he preference is [facility] because they have worked with his wife and this is his choice of agency today  Requests cm call pcp to updated him  Clydie Braun at [facility] office states pcp left for the day and transferred CM to Darlene, clinical assistance  son at bedside given CM contact#, Armed forces logistics/support/administrative officer # and list of PDNs when pt states wife is primary caregiver and has "issues herself"  Son & daughter do not live in the home but visits very frequently  DME at home include walker & wheelchair  Pt informed CM he fell over a bar dividing the carpet and tile     Action/Plan:   noted hip fx Reviewed EPIC notes Spoke with pt about home health agency choices Provided list of agencies.  spoke with Clydie Braun to transfer to Darlene at [facility] office to update on pt A Voice message was left on Darlene voice message Cm also sent   Action/Plan Detail:   an in basket msg in EPIC with update on pt 1310 Spoke with Jorja Loa of [facility] -to verify if pt is eligible for services & updated CM @1314    Anticipated DC Date:  07/07/2014     Status Recommendation to Physician:   Result of Recommendation:    Other ED Services  Consult Working Plan    DC Planning Services  Other  Outpatient Services - Pt will follow up   [facility] Choice  DURABLE MEDICAL EQUIPMENT   Choice offered to / List presented to:  C-1 Patient         [facility] agency  [facility]    Status of service:  Completed, signed off  ED Comments:   ED Comments Detail:  07/04/14 1559 Cm notified [facility] staff at [facility] [phone] of pt transfer to [facility]  for surgery No bed number at this time but [facility] is choice for home health services  1554 ED CM updated pt about return call from [place] at 1319 to state they would not be able to provide services for [facility] pt at this time.  Pt states his next choice would be [facility] for services CM assisted pt to call his wife He provided Cm with the home number He wanted to discuss with wife what Dr Shon Baton discussed    CM reviewed in details medicare guidelines, home health [facility]) (length of stay in home, [facility] available, coverage, primary caregiver, up to 24 hrs before services may be started), Private duty nursing (PDN-coverage, length of stay in the home types of staff available),   CM provided family with a list of [place] home health agencies, PDN, Discussed pt to be further evaluated by unit therapists (PT/OT) for recommendation of level of care and share this with attending MD and unit CM

## 2014-07-04 NOTE — Progress Notes (Signed)
Orthopedic Tech Progress Note Patient Details:  [name] 01-22-1934 [id number]  Musculoskeletal Traction Type of Traction: Bucks Skin Traction Traction Location: LLE Traction Weight: 10 lbs    Jennye Moccasin 07/04/2014, 10:15 PM

## 2014-07-04 NOTE — Consult Note (Signed)
[name] Apo, MD Chief Complaint: Left hip pain History: 81 y/o male with history of CAD/MI, PE, HLD, PVD, stroke, and COPD experienced a mechanical fall on the evening of 07/03/2014. The patient had a neighbor help him get back to bed. When the patient woke up on 07/04/2014, he was not able to palpate because of significant pain in his left hip. As result, EMS was activated. The patient denies any syncope, chest pain, short of breath, palpitations, dizziness, nausea, vomiting. The patient was in his usual state of health prior to his mechanical fall. His only complaint at this time is left hip pain. Workup in the emergency department revealed a left intertrochanteric hip fracture on hip x-ray. In the emergency department, the patient received morphine 4 mg IV. He was hemodynamically stable and afebrile. Oxygen saturation was 94-95 percent on room air. Orthopedics was consulted. Dr. Venita Lick wanted pt transferred to [facility] for repair of his hip fracture. X-ray showed right upper lobe atelectasis without any pulmonary edema. Past Medical History  Diagnosis Date  . Asthma   . Hypertension   . Pulmonary embolism   . Peripheral artery disease   . Hypercholesteremia   . Bursitis of right shoulder   . DVT of leg (deep venous thrombosis)   . Cough   . Trouble swallowing   . Change in voice   . Difficulty urinating   . Easy bruising   . CAD (coronary artery disease) 10/02/2011    Severe 2 Vessel CAD with early Mid LAD 95% focal lesion followed by 60-70% irregular stenoses just after bifurcating SP1 and AV Groove Cx-OM1/OM2 bifurcation 90% lesion  . Stroke Feb. 2013  . Pneumonia Feb. 2013  . Myocardial infarction Sep 26, 2011  . Fall at home Sept. 2, 2014    Pt fell down 2 stairs  . COPD (chronic obstructive pulmonary disease) Sep 11, 2013    slight touch    No Known Allergies  No current facility-administered medications on file prior to encounter.   Current Outpatient  Prescriptions on File Prior to Encounter  Medication Sig Dispense Refill  . Albuterol Sulfate (PROAIR RESPICLICK) 108 (90 BASE) MCG/ACT AEPB Inhale 1-2 puffs into the lungs every 4 (four) hours as needed. (Patient taking differently: Inhale 1-2 puffs into the lungs as needed. )    . aspirin EC 81 MG tablet Take 81 mg by mouth every evening.     . bisoprolol (ZEBETA) 5 MG tablet Take 1 tablet (5 mg total) by mouth daily. 30 tablet 11  . citalopram (CELEXA) 20 MG tablet Take 20 mg by mouth every morning.     . clopidogrel (PLAVIX) 75 MG tablet Take 75 mg by mouth every morning.     . diclofenac sodium (VOLTAREN) 1 % GEL Apply 2 g topically 4 (four) times daily. (Patient taking differently: Apply 2 g topically 4 (four) times daily as needed (pain). ) 100 g 3  . diltiazem (DILACOR XR) 180 MG 24 hr capsule Take 180 mg by mouth every morning.     . Fluticasone Furoate-Vilanterol (BREO ELLIPTA) 100-25 MCG/INH AEPB Inhale 1 puff into the lungs every morning. 1 each 11  . isosorbide mononitrate (IMDUR) 30 MG 24 hr tablet Take 30 mg by mouth every morning.     . lactose free nutrition (BOOST) LIQD Take 237 mLs by mouth every morning. Chocolate.    . Multiple Vitamin (MULTIVITAMIN WITH MINERALS) TABS tablet Take 1 tablet by mouth every morning.     . nitroGLYCERIN (  NITROSTAT) 0.4 MG SL tablet Place 0.4 mg under the tongue every 5 (five) minutes as needed for chest pain.    . pantoprazole (PROTONIX) 40 MG tablet Take 40 mg by mouth every morning.     . polyethylene glycol (MIRALAX / GLYCOLAX) packet Take 17 g by mouth daily as needed for moderate constipation.    . simvastatin (ZOCOR) 10 MG tablet Take 10 mg by mouth every evening.     . Tamsulosin HCl (FLOMAX) 0.4 MG CAPS Take 0.4 mg by mouth every morning.     . vitamin C (ASCORBIC ACID) 500 MG tablet Take 500 mg by mouth every morning.     . zinc sulfate 220 MG capsule Take 220 mg by mouth every morning.    Marland Kitchen ALPRAZolam (XANAX) 0.25 MG tablet Take 0.25 mg  by mouth daily as needed for anxiety.      Physical Exam: Filed Vitals:   07/04/14 1409  BP: 150/68  Pulse: 88  Temp:   Resp: 18  A+O X3 No sob/cp abd soft/nt Compartment soft/nt Ehl/ta/ga intact 1+ DPT/PT pulses   Image: Dg Chest 1 View  07/04/2014   CLINICAL DATA:  Known left hip fracture  EXAM: CHEST  1 VIEW  COMPARISON:  09/12/2013  FINDINGS: Cardiac shadow is stable. The lungs are well aerated bilaterally. Focal area of increased density is noted in the right upper lobe which may represent some early atelectasis. Mild vascular prominence is seen. Coronary stenting is noted.  IMPRESSION: Mild right upper lobe atelectasis  Mild vascular prominence.   Electronically Signed   By: Alcide Clever M.D.   On: 07/04/2014 10:29   Dg Hip Unilat With Pelvis 2-3 Views Left  07/04/2014   CLINICAL DATA:  Fall last evening with persistent left hip pain, initial encounter  EXAM: LEFT HIP (WITH PELVIS) 2-3 VIEWS  COMPARISON:  None.  FINDINGS: Postsurgical changes are noted in the proximal right femur. The pelvic ring is intact. Postsurgical changes are noted within the abdomen as well as within the iliac artery on the right.  A comminuted intratrochanteric fracture with impaction and angulation at the fracture site is noted. No gross soft tissue abnormality is seen.  IMPRESSION: Left intertrochanteric fracture   Electronically Signed   By: Alcide Clever M.D.   On: 07/04/2014 10:28    A/P:  Patient s/p fall with left hip pain. Closed left intertroch. Fx. Plan on ORIF with troch nail once medically cleared Transfer to [facility] for surgery 10 lbs bucks traction for comfort

## 2014-07-04 NOTE — ED Notes (Signed)
Called [facility] to check on status for transport, dispatcher reported that a truck is on the way.

## 2014-07-04 NOTE — Progress Notes (Signed)
Ortho tech notified for placement of buck's traction per order.

## 2014-07-04 NOTE — Progress Notes (Addendum)
Received report from [place] ed

## 2014-07-04 NOTE — ED Provider Notes (Signed)
CSN: [id number]     Arrival date & time 07/04/14  4098 History   First MD Initiated Contact with Patient 07/04/14 [phone]     Chief Complaint  Patient presents with  . Fall  . Hip Pain     (Consider location/radiation/quality/duration/timing/severity/associated sxs/prior Treatment) HPI Comments: Patient here after witnessed fall at home yesterday and now complains of left hip pain characterized as sharp and worse with movement. Patient has a prior history of CVA and does use a walker normally. Denies any head injury. No neck pain. No chest or abdominal pain. Denies any knee discomfort or ankle discomfort on the left side. Family helped him back into bed last night and this morning when he couldn't walk EMS was called and patient was in severe pain and given fentanyl and transported here.  Patient is a 81 y.o. male presenting with fall and hip pain. The history is provided by the patient.  Fall  Hip Pain    Past Medical History  Diagnosis Date  . Asthma   . Hypertension   . Pulmonary embolism   . Peripheral artery disease   . Hypercholesteremia   . Bursitis of right shoulder   . DVT of leg (deep venous thrombosis)   . Cough   . Trouble swallowing   . Change in voice   . Difficulty urinating   . Easy bruising   . CAD (coronary artery disease) 10/02/2011    Severe 2 Vessel CAD with early Mid LAD 95% focal lesion followed by 60-70% irregular stenoses just after bifurcating SP1 and AV Groove Cx-OM1/OM2 bifurcation 90% lesion  . Stroke Feb. 2013  . Pneumonia Feb. 2013  . Myocardial infarction Sep 26, 2011  . Fall at home Sept. 2, 2014    Pt fell down 2 stairs  . COPD (chronic obstructive pulmonary disease) Sep 11, 2013    slight touch   Past Surgical History  Procedure Laterality Date  . Hernia repair    . Appendectomy    . Femoral-femoral bypass graft    . Iliac artery stent    . Esophagogastroduodenoscopy  07/03/2011    Procedure: ESOPHAGOGASTRODUODENOSCOPY (EGD);  Surgeon:  Shirley Friar, MD;  Location: [facility];  Service: Endoscopy;  Laterality: N/A;  . Compression hip screw  06/26/2011    Procedure: COMPRESSION HIP;  Surgeon: Raymon Mutton, MD;  Location: [facility];  Service: Orthopedics;  Laterality: Right;  . Laparoscopy  07/10/2011    Procedure: LAPAROSCOPY DIAGNOSTIC;  Surgeon: Ardeth Sportsman, MD;  Location: [facility];  Service: General;  Laterality: N/A;  serousal repair,  . Esophagogastroduodenoscopy  09/28/2011    Procedure: ESOPHAGOGASTRODUODENOSCOPY (EGD);  Surgeon: Willis Modena, MD;  Location: [facility];  Service: Endoscopy;  Laterality: N/A;  . Femoral-femoral bypass graft  10/05/2011    Procedure: BYPASS GRAFT FEMORAL-FEMORAL ARTERY;  Surgeon: Larina Earthly, MD;  Location: [facility] OR;  Service: Vascular;  Laterality: Bilateral;  Thrombectomy of Femoral-Femoral artery bypass graft  . Fracture surgery  06/24/11    Right Hip  . Joint replacement  06/24/11    Right Hip  . Small intestine surgery  March 2013  . Ercp N/A 04/20/2013    Procedure: ENDOSCOPIC RETROGRADE CHOLANGIOPANCREATOGRAPHY (ERCP);  Surgeon: Petra Kuba, MD;  Location: [facility];  Service: Endoscopy;  Laterality: N/A;  . Esophagogastroduodenoscopy N/A 05/06/2013    Procedure: ESOPHAGOGASTRODUODENOSCOPY (EGD);  Surgeon: Vertell Novak., MD;  Location: [facility];  Service: Endoscopy;  Laterality: N/A;  . Left heart catheterization with  coronary angiogram N/A 10/02/2011    Procedure: LEFT HEART CATHETERIZATION WITH CORONARY ANGIOGRAM;  Surgeon: Marykay Lex, MD;  Location: [facility] CATH LAB;  Service: Cardiovascular;  Laterality: N/A;   Family History  Problem Relation Age of Onset  . Hyperlipidemia Father   . Deep vein thrombosis Father    History  Substance Use Topics  . Smoking status: Former Smoker -- 0.50 packs/day for 30 years    Types: Cigarettes    Quit date: 08/20/1988  . Smokeless tobacco: Never Used  . Alcohol Use: No    Review of Systems  All other systems reviewed and  are negative.     Allergies  Review of patient's allergies indicates no known allergies.  Home Medications   Prior to Admission medications   Medication Sig Start Date End Date Taking? Authorizing Provider  Albuterol Sulfate (PROAIR RESPICLICK) 108 (90 BASE) MCG/ACT AEPB Inhale 1-2 puffs into the lungs every 4 (four) hours as needed. Patient taking differently: Inhale 1-2 puffs into the lungs as needed.  12/14/13   Nyoka Cowden, MD  ALPRAZolam Prudy Feeler) 0.25 MG tablet Take 0.25 mg by mouth daily as needed for anxiety.    Historical Provider, MD  aspirin EC 81 MG tablet Take 81 mg by mouth every evening.     Historical Provider, MD  bisoprolol (ZEBETA) 5 MG tablet Take 1 tablet (5 mg total) by mouth daily. 12/27/13   Nyoka Cowden, MD  citalopram (CELEXA) 20 MG tablet Take 20 mg by mouth every morning.  07/27/12   Historical Provider, MD  clopidogrel (PLAVIX) 75 MG tablet Take 75 mg by mouth every morning.     Historical Provider, MD  diclofenac sodium (VOLTAREN) 1 % GEL Apply 2 g topically 4 (four) times daily. 01/04/14   Abelino Derrick, PA-C  diltiazem (DILACOR XR) 180 MG 24 hr capsule Take 180 mg by mouth every morning.     Historical Provider, MD  Fluticasone Furoate-Vilanterol (BREO ELLIPTA) 100-25 MCG/INH AEPB Inhale 1 puff into the lungs every morning. 12/27/13   Nyoka Cowden, MD  isosorbide mononitrate (IMDUR) 30 MG 24 hr tablet Take 30 mg by mouth every morning.     Historical Provider, MD  lactose free nutrition (BOOST) LIQD Take 237 mLs by mouth 3 (three) times daily. Chocolate.    Historical Provider, MD  Loratadine 10 MG CAPS  07/22/13   Historical Provider, MD  Multiple Vitamin (MULTIVITAMIN WITH MINERALS) TABS tablet Take 1 tablet by mouth every morning.     Historical Provider, MD  nitroGLYCERIN (NITROSTAT) 0.4 MG SL tablet Place 0.4 mg under the tongue every 5 (five) minutes as needed for chest pain.    Historical Provider, MD  pantoprazole (PROTONIX) 40 MG tablet Take 40 mg by  mouth every morning.     Historical Provider, MD  polyethylene glycol (MIRALAX / GLYCOLAX) packet Take 17 g by mouth daily as needed for moderate constipation.    Historical Provider, MD  simvastatin (ZOCOR) 10 MG tablet Take 10 mg by mouth every evening.     Historical Provider, MD  Tamsulosin HCl (FLOMAX) 0.4 MG CAPS Take 0.4 mg by mouth every morning.     Historical Provider, MD  vitamin C (ASCORBIC ACID) 500 MG tablet Take 500 mg by mouth every morning.     Historical Provider, MD  zinc sulfate 220 MG capsule Take 220 mg by mouth every morning.    Historical Provider, MD   BP 153/99 mmHg  Pulse 110  Temp(Src)  97.8 F (36.6 C) (Oral)  Resp 18  SpO2 89% Physical Exam  Constitutional: He is oriented to person, place, and time. He appears well-developed and well-nourished.  Non-toxic appearance. No distress.  HENT:  Head: Normocephalic and atraumatic.  Eyes: Conjunctivae, EOM and lids are normal. Pupils are equal, round, and reactive to light.  Neck: Normal range of motion. Neck supple. No tracheal deviation present. No thyroid mass present.  Cardiovascular: Regular rhythm and normal heart sounds.  Tachycardia present.  Exam reveals no gallop.   No murmur heard. Pulmonary/Chest: Effort normal and breath sounds normal. No stridor. No respiratory distress. He has no decreased breath sounds. He has no wheezes. He has no rhonchi. He has no rales.  Abdominal: Soft. Normal appearance and bowel sounds are normal. He exhibits no distension. There is no tenderness. There is no rebound and no CVA tenderness.  Musculoskeletal: Normal range of motion. He exhibits no edema or tenderness.  Left lower extremity is shortened and rotated externally. The soft. His pulse 2+. Skin intact. No swelling or tenderness to palpation at left ankle or left knee. Left thigh compartment soft  Neurological: He is alert and oriented to person, place, and time. He has normal strength. No cranial nerve deficit or sensory  deficit. GCS eye subscore is 4. GCS verbal subscore is 5. GCS motor subscore is 6.  Patient with residual left-sided weakness from prior CVA. He is at baseline at this time with exception of his left lower extremity exam which is unable to be performed due to pain  Skin: Skin is warm and dry. No abrasion and no rash noted.  Psychiatric: He has a normal mood and affect. His speech is normal and behavior is normal.  Nursing note and vitals reviewed.   ED Course  Procedures (including critical care time) Labs Review Labs Reviewed  CBC WITH DIFFERENTIAL/PLATELET  BASIC METABOLIC PANEL    Imaging Review No results found.   EKG Interpretation None      MDM   Final diagnoses:  Trauma    Patient given pain meds here and spoke with orthopedist Dr. Shon Baton, and he requested patient be sent to [facility] for admission and for operative repair of the patient's hip fracture    Toy Baker, MD 07/04/14 1253

## 2014-07-04 NOTE — H&P (Signed)
History and Physical  [name] ZOX:[id number] DOB: 08/16/1933 DOA: 07/04/2014   PCP: Katy Apo, MD   Chief Complaint: mechanical fall with L-hip fx  HPI:  81 y/o male with history of CAD/MI, PE, HLD, PVD, stroke, and COPD experienced a mechanical fall on the evening of 07/03/2014. The patient had a neighbor help him get back to bed. When the patient woke up on 07/04/2014, he was not able to palpate because of significant pain in his left hip. As result, EMS was activated. The patient denies any syncope, chest pain, short of breath, palpitations, dizziness, nausea, vomiting. The patient was in his usual state of health prior to his mechanical fall. His only complaint at this time is left hip pain. Workup in the emergency department revealed a left intertrochanteric hip fracture on hip x-ray. In the emergency department, the patient received morphine 4 mg IV. He was hemodynamically stable and afebrile. Oxygen saturation was 94-95 percent on room air. Orthopedics was consulted. Dr. Venita Lick wanted pt transferred to [facility] for repair of his hip fracture. X-ray showed right upper lobe atelectasis without any pulmonary edema.   Assessment/Plan: Left intertrochanteric hip fracture  -Secondary to mechanical fall  -Ortho, Dr. Shon Baton, wants pt transferred to [facility] for surgery -Due to the patient's comorbidities including history of stroke and MI, he is at least a moderate risk for surgery; however, additional medical interventions or diagnostic studies will not alter his risk at this time  -EKG  -I less than patient's EKG is extremely abnormal suggesting ischemic changes, he is able to go to surgery from a medical standpoint with the known risks involved  -The patient normally is able to ambulate with a walker  -Case was discussed with the patient's son at the bedside who understands the elevated medical risks (including but not limited to cardiac arrest and stroke), but is  agreeable to have the patient undergo hip surgery  -The patient normally takes plavix which normally will take 5 days to wash out--will defer ultimate decision for surgery regarding plavix to orthopedics -plavix will need to be restarted 07/05/14 Coronary artery disease with hx NSTEMI, BMS to CFX/LAD 10/03/11 -no CP or angina at this time -EKG -no CHF symptoms presently -continue Zebeta, Imdur -ASA/Plavix PVD, thrombosed R-L BPG, S/P thrombectomy 10/05/11 No claudication but overall activity limited to wheel chair PE (pulmonary embolism) July 2012, IVC filter placed 10/03/11 -taken off coumadin due to GI bleed CVA, 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 Asthma   . Hypertension   . Pulmonary embolism   . Peripheral artery disease   . Hypercholesteremia   . Bursitis of right shoulder   . DVT of leg (deep venous thrombosis)   . Cough   . Trouble swallowing   . Change in voice   . Difficulty urinating   . Easy bruising   . CAD (coronary artery disease) 10/02/2011    Severe 2 Vessel CAD with early Mid LAD 95% focal lesion followed by 60-70% irregular stenoses just after bifurcating SP1 and AV Groove Cx-OM1/OM2 bifurcation 90% lesion  . Stroke Feb. 2013  . Pneumonia Feb. 2013  . Myocardial infarction Sep 26, 2011  . Fall at home Sept. 2,  2014    Pt fell down 2 stairs  . COPD (chronic obstructive pulmonary disease) Sep 11, 2013    slight touch   Past Surgical History  Procedure Laterality Date  . Hernia repair    . Appendectomy    . Femoral-femoral bypass graft    . Iliac artery stent    . Esophagogastroduodenoscopy  07/03/2011    Procedure: ESOPHAGOGASTRODUODENOSCOPY (EGD);  Surgeon: Shirley Friar, MD;  Location: [facility];  Service:  Endoscopy;  Laterality: N/A;  . Compression hip screw  06/26/2011    Procedure: COMPRESSION HIP;  Surgeon: Raymon Mutton, MD;  Location: [facility];  Service: Orthopedics;  Laterality: Right;  . Laparoscopy  07/10/2011    Procedure: LAPAROSCOPY DIAGNOSTIC;  Surgeon: Ardeth Sportsman, MD;  Location: [facility];  Service: General;  Laterality: N/A;  serousal repair,  . Esophagogastroduodenoscopy  09/28/2011    Procedure: ESOPHAGOGASTRODUODENOSCOPY (EGD);  Surgeon: Willis Modena, MD;  Location: [facility];  Service: Endoscopy;  Laterality: N/A;  . Femoral-femoral bypass graft  10/05/2011    Procedure: BYPASS GRAFT FEMORAL-FEMORAL ARTERY;  Surgeon: Larina Earthly, MD;  Location: [facility] OR;  Service: Vascular;  Laterality: Bilateral;  Thrombectomy of Femoral-Femoral artery bypass graft  . Fracture surgery  06/24/11    Right Hip  . Joint replacement  06/24/11    Right Hip  . Small intestine surgery  March 2013  . Ercp N/A 04/20/2013    Procedure: ENDOSCOPIC RETROGRADE CHOLANGIOPANCREATOGRAPHY (ERCP);  Surgeon: Petra Kuba, MD;  Location: [facility];  Service: Endoscopy;  Laterality: N/A;  . Esophagogastroduodenoscopy N/A 05/06/2013    Procedure: ESOPHAGOGASTRODUODENOSCOPY (EGD);  Surgeon: Vertell Novak., MD;  Location: [facility];  Service: Endoscopy;  Laterality: N/A;  . Left heart catheterization with coronary angiogram N/A 10/02/2011    Procedure: LEFT HEART CATHETERIZATION WITH CORONARY ANGIOGRAM;  Surgeon: Marykay Lex, MD;  Location: [facility] C[facility] LAB;  Service: Cardiovascular;  Laterality: N/A;   Social History:  reports that he quit smoking about 25 years ago. His smoking use included Cigarettes. He has a 15 pack-year smoking history. He has never used smokeless tobacco. He reports that he does not drink alcohol or use illicit drugs.   Family History  Problem Relation Age of Onset  . Hyperlipidemia Father   . Deep vein thrombosis Father      No Known Allergies    Prior to Admission medications     Medication Sig Start Date End Date Taking? Authorizing Provider  Albuterol Sulfate (PROAIR RESPICLICK) 108 (90 BASE) MCG/ACT AEPB Inhale 1-2 puffs into the lungs every 4 (four) hours as needed. Patient taking differently: Inhale 1-2 puffs into the lungs as needed.  12/14/13  Yes Nyoka Cowden, MD  aspirin EC 81 MG tablet Take 81 mg by mouth every evening.    Yes Historical Provider, MD  bisoprolol (ZEBETA) 5 MG tablet Take 1 tablet (5 mg total) by mouth daily. 12/27/13  Yes Nyoka Cowden, MD  citalopram (CELEXA) 20 MG tablet Take 20 mg by mouth every morning.  07/27/12  Yes Historical Provider, MD  clopidogrel (PLAVIX) 75 MG tablet Take 75 mg by mouth every morning.    Yes Historical Provider, MD  diclofenac sodium (VOLTAREN) 1 % GEL Apply 2 g topically 4 (four) times daily. Patient taking differently: Apply 2 g topically 4 (four) times daily as needed (pain).  01/04/14  Yes Luke K Kilroy, PA-C  diltiazem (DILACOR XR) 180 MG 24 hr capsule Take 180 mg by mouth  every morning.    Yes Historical Provider, MD  Fluticasone Furoate-Vilanterol (BREO ELLIPTA) 100-25 MCG/INH AEPB Inhale 1 puff into the lungs every morning. 12/27/13  Yes Nyoka Cowden, MD  isosorbide mononitrate (IMDUR) 30 MG 24 hr tablet Take 30 mg by mouth every morning.    Yes Historical Provider, MD  lactose free nutrition (BOOST) LIQD Take 237 mLs by mouth every morning. Chocolate.   Yes Historical Provider, MD  Multiple Vitamin (MULTIVITAMIN WITH MINERALS) TABS tablet Take 1 tablet by mouth every morning.    Yes Historical Provider, MD  nebivolol (BYSTOLIC) 10 MG tablet Take 10 mg by mouth daily.   Yes Historical Provider, MD  nitroGLYCERIN (NITROSTAT) 0.4 MG SL tablet Place 0.4 mg under the tongue every 5 (five) minutes as needed for chest pain.   Yes Historical Provider, MD  pantoprazole (PROTONIX) 40 MG tablet Take 40 mg by mouth every morning.    Yes Historical Provider, MD  polyethylene glycol (MIRALAX / GLYCOLAX) packet Take 17 g by  mouth daily as needed for moderate constipation.   Yes Historical Provider, MD  predniSONE (DELTASONE) 10 MG tablet Take 10 mg by mouth daily with breakfast.   Yes Historical Provider, MD  simvastatin (ZOCOR) 10 MG tablet Take 10 mg by mouth every evening.    Yes Historical Provider, MD  Tamsulosin HCl (FLOMAX) 0.4 MG CAPS Take 0.4 mg by mouth every morning.    Yes Historical Provider, MD  vitamin C (ASCORBIC ACID) 500 MG tablet Take 500 mg by mouth every morning.    Yes Historical Provider, MD  zinc sulfate 220 MG capsule Take 220 mg by mouth every morning.   Yes Historical Provider, MD  ALPRAZolam Prudy Feeler) 0.25 MG tablet Take 0.25 mg by mouth daily as needed for anxiety.    Historical Provider, MD    Review of Systems:  Constitutional:  No weight loss, night sweats, Fevers, chills, fatigue.  Head&Eyes: No headache.  No vision loss.   ENT:  No Difficulty swallowing,Tooth/dental problems,Sore throat,   Cardio-vascular:  No chest pain, Orthopnea, PND, swelling in lower extremities,  dizziness, palpitations  GI:  No  abdominal pain, nausea, vomiting, diarrhea, loss of appetite, hematochezia, melena Resp:  No shortness of breath with exertion or at rest. No cough. No coughing up of blood .No wheezing.No chest wall deformity  Skin:  no rash or lesions.  GU:  no dysuria, change in color of urine, no urgency or frequency. No flank pain.  Musculoskeletal:  No joint pain or swelling. No decreased range of motion. No back pain.  Psych:  No change in mood or affect.  Neurologic: No headache, no dysesthesia, no focal weakness, no vision loss. No syncope  Physical Exam: Filed Vitals:   07/04/14 0959 07/04/14 1023 07/04/14 1102 07/04/14 1128  BP:  137/87  152/70  Pulse:  97 89 91  Temp:      TempSrc:      Resp:   18 18  SpO2: 96% 97% 98% 97%   General:  A&O x 3, NAD, nontoxic, pleasant/cooperative Head/Eye: No conjunctival hemorrhage, no icterus, [place]/AT, No nystagmus ENT:  No icterus,   No thrush, good dentition, no pharyngeal exudate Neck:  No masses, no lymphadenpathy, no bruits CV:  RRR, no rub, no gallop, no S3 Lung:  "Auscultation, right basilar crackles. No wheeze Abdomen: soft/NT, +BS, nondistended, no peritoneal signs Ext: No cyanosis, No rashes, No petechiae, No lymphangitis, trace LLE edema   Labs on Admission:  Basic Metabolic Panel:  Recent Labs Lab 07/04/14 1003  NA 140  K 4.0  CL 109  CO2 23  GLUCOSE 149*  BUN 23  CREATININE 1.36*  CALCIUM 9.0   Liver Function Tests: No results for input(s): AST, ALT, ALKPHOS, BILITOT, PROT, ALBUMIN in the last 168 hours. No results for input(s): LIPASE, AMYLASE in the last 168 hours. No results for input(s): AMMONIA in the last 168 hours. CBC:  Recent Labs Lab 07/04/14 1003  WBC 8.1  NEUTROABS 7.0  HGB 12.0*  HCT 37.8*  MCV 85.3  PLT 196   Cardiac Enzymes: No results for input(s): CKTOTAL, CKMB, CKMBINDEX, TROPONINI in the last 168 hours. BNP: Invalid input(s): POCBNP CBG: No results for input(s): GLUCAP in the last 168 hours.  Radiological Exams on Admission: Dg Chest 1 View  07/04/2014   CLINICAL DATA:  Known left hip fracture  EXAM: CHEST  1 VIEW  COMPARISON:  09/12/2013  FINDINGS: Cardiac shadow is stable. The lungs are well aerated bilaterally. Focal area of increased density is noted in the right upper lobe which may represent some early atelectasis. Mild vascular prominence is seen. Coronary stenting is noted.  IMPRESSION: Mild right upper lobe atelectasis  Mild vascular prominence.   Electronically Signed   By: Alcide Clever M.D.   On: 07/04/2014 10:29   Dg Hip Unilat With Pelvis 2-3 Views Left  07/04/2014   CLINICAL DATA:  Fall last evening with persistent left hip pain, initial encounter  EXAM: LEFT HIP (WITH PELVIS) 2-3 VIEWS  COMPARISON:  None.  FINDINGS: Postsurgical changes are noted in the proximal right femur. The pelvic ring is intact. Postsurgical changes are noted within the abdomen  as well as within the iliac artery on the right.  A comminuted intratrochanteric fracture with impaction and angulation at the fracture site is noted. No gross soft tissue abnormality is seen.  IMPRESSION: Left intertrochanteric fracture   Electronically Signed   By: Alcide Clever M.D.   On: 07/04/2014 10:28    EKG: Independently reviewed. pending    Time spent:70 minutes Code Status:   FULL Family Communication:   Son updated at bedside   Berklie Dethlefs, DO  [facility] Pager [phone]  If 7PM-7AM, please contact night-coverage [email or web address] Password [facility] 07/04/2014, 12:27 PM

## 2014-07-04 NOTE — Progress Notes (Signed)
Clinical Social Work Department BRIEF PSYCHOSOCIAL ASSESSMENT 07/04/2014  Patient:  [name], [name]     Account Number:  [id number]     Admit date:  07/04/2014  Clinical Social Worker:  Merrilyn Legler Inez Catalina  Date/Time:  07/04/2014 02:08 PM  Referred by:  Physician  Date Referred:  07/04/2014 Referred for  SNF Placement   Other Referral:   Interview type:  Patient Other interview type:    PSYCHOSOCIAL DATA Living Status:  FAMILY Admitted from facility:   Level of care:   Primary support name:  [name] Primary support relationship to patient:  SPOUSE Degree of support available:   strong, however limited support due to physical needs.  Pt son is strong support, at bedside during ed visit [name]    CURRENT CONCERNS Current Concerns  Post-Acute Placement   Other Concerns:    SOCIAL WORK ASSESSMENT / PLAN CSW met with pt at bedside to complete psychosocial assessment. P shares that he currently lives at home with his wife, however pt wife walks with a walker and will not be able to take care of patient per patient. Pt shares that his son is a strong support, and had just left to go to work. Pt son is [name]. Pt and CSW discussed current admission status. Patient sharess he is interested in skilled nursing placement for short term rehab after hip surgery. Patien shares he is interested in any skilled nursing palcement in [facility] except for [facility].    CSW provided supportive counseling. Pt shares he is hopeful for a full recovery and is motiaveted to go to Short term rehab at a skilled nursing facility.   Assessment/plan status:  Psychosocial Support/Ongoing Assessment of Needs Other assessment/ plan:   Information/referral to community resources:   skilled nursing facility list    PATIENT'S/FAMILY'S RESPONSE TO PLAN OF CARE: Patient thanked csw for concern and support. Pt is motivated to discharge to skilled nursing for short  term rehab once medically stable. Pt family is a strong support for patient.       Belia Heman, [place] Work  [facility] [phone]  07/04/2014 2:15 PM

## 2014-07-04 NOTE — ED Notes (Signed)
Per EMS pt from home with witnessed fall. NO LOC. Pt on PLAVIX.  Pt  Pt assisted back in bed per family. Pt unable to ambulate this morning upon awakening. Noted deformity of left hip. Pt given 100 mcg of fentanyl en route with EMS.

## 2014-07-05 ENCOUNTER — Inpatient Hospital Stay (HOSPITAL_COMMUNITY): Payer: Commercial Managed Care - HMO

## 2014-07-05 ENCOUNTER — Inpatient Hospital Stay (HOSPITAL_COMMUNITY): Payer: Commercial Managed Care - HMO | Admitting: Certified Registered Nurse Anesthetist

## 2014-07-05 ENCOUNTER — Encounter (HOSPITAL_COMMUNITY)
Admission: EM | Disposition: A | Discharge status: Skilled Nursing Facility | Payer: Self-pay | Source: Home / Self Care | Attending: Internal Medicine

## 2014-07-05 ENCOUNTER — Encounter (HOSPITAL_COMMUNITY): Payer: Self-pay | Admitting: Certified Registered Nurse Anesthetist

## 2014-07-05 DIAGNOSIS — I2699 Other pulmonary embolism without acute cor pulmonale: Secondary | ICD-10-CM

## 2014-07-05 DIAGNOSIS — S72009A Fracture of unspecified part of neck of unspecified femur, initial encounter for closed fracture: Secondary | ICD-10-CM | POA: Diagnosis present

## 2014-07-05 DIAGNOSIS — I739 Peripheral vascular disease, unspecified: Secondary | ICD-10-CM

## 2014-07-05 HISTORY — PX: INTRAMEDULLARY (IM) NAIL INTERTROCHANTERIC: SHX5875

## 2014-07-05 SURGERY — FIXATION, FRACTURE, INTERTROCHANTERIC, WITH INTRAMEDULLARY ROD
Anesthesia: General | Site: Hip | Laterality: Left

## 2014-07-05 MED ORDER — ACETAMINOPHEN 650 MG RE SUPP: 650.0000 mg | Freq: Four times a day (QID) | RECTAL | Status: DC | PRN

## 2014-07-05 MED ORDER — METHOCARBAMOL 500 MG PO TABS
500.0000 mg | ORAL_TABLET | Freq: Four times a day (QID) | ORAL | Status: DC | PRN
  Administered 2014-07-06 – 2014-07-07 (×2): 500 mg via ORAL
  Filled 2014-07-05 (×2): qty 1

## 2014-07-05 MED ORDER — PHENYLEPHRINE HCL 10 MG/ML IJ SOLN
INTRAMUSCULAR | Status: DC | PRN
  Administered 2014-07-05: 80 ug via INTRAVENOUS
  Administered 2014-07-05: 120 ug via INTRAVENOUS
  Administered 2014-07-05 (×3): 80 ug via INTRAVENOUS

## 2014-07-05 MED ORDER — CEFAZOLIN SODIUM-DEXTROSE 2-3 GM-% IV SOLR
2.0000 g | Freq: Four times a day (QID) | INTRAVENOUS | Status: AC
  Administered 2014-07-05 – 2014-07-06 (×2): 2 g via INTRAVENOUS
  Filled 2014-07-05 (×2): qty 50

## 2014-07-05 MED ORDER — BUPIVACAINE HCL (PF) 0.25 % IJ SOLN
INTRAMUSCULAR | Status: AC
  Filled 2014-07-05: qty 30

## 2014-07-05 MED ORDER — ROCURONIUM BROMIDE 100 MG/10ML IV SOLN
INTRAVENOUS | Status: DC | PRN
  Administered 2014-07-05: 40 mg via INTRAVENOUS

## 2014-07-05 MED ORDER — DEXMEDETOMIDINE HCL IN NACL 200 MCG/50ML IV SOLN
INTRAVENOUS | Status: AC
  Filled 2014-07-05: qty 50

## 2014-07-05 MED ORDER — FERROUS SULFATE 325 (65 FE) MG PO TABS
325.0000 mg | ORAL_TABLET | Freq: Three times a day (TID) | ORAL | Status: DC
  Administered 2014-07-05 – 2014-07-09 (×11): 325 mg via ORAL
  Filled 2014-07-05 (×11): qty 1

## 2014-07-05 MED ORDER — FENTANYL CITRATE 0.05 MG/ML IJ SOLN
25.0000 ug | INTRAMUSCULAR | Status: DC | PRN
  Administered 2014-07-05: 25 ug via INTRAVENOUS

## 2014-07-05 MED ORDER — ONDANSETRON HCL 4 MG PO TABS: 4.0000 mg | ORAL_TABLET | Freq: Four times a day (QID) | ORAL | Status: DC | PRN

## 2014-07-05 MED ORDER — GLYCOPYRROLATE 0.2 MG/ML IJ SOLN
INTRAMUSCULAR | Status: DC | PRN
  Administered 2014-07-05: .8 mg via INTRAVENOUS

## 2014-07-05 MED ORDER — LACTATED RINGERS IV SOLN
INTRAVENOUS | Status: DC | PRN
  Administered 2014-07-05: 16:00:00 via INTRAVENOUS

## 2014-07-05 MED ORDER — LACTATED RINGERS IV SOLN
INTRAVENOUS | Status: DC
  Administered 2014-07-05: 15:00:00 via INTRAVENOUS

## 2014-07-05 MED ORDER — ALBUTEROL SULFATE HFA 108 (90 BASE) MCG/ACT IN AERS
INHALATION_SPRAY | RESPIRATORY_TRACT | Status: AC
  Filled 2014-07-05: qty 6.7

## 2014-07-05 MED ORDER — METHOCARBAMOL 1000 MG/10ML IJ SOLN: 500.0000 mg | Freq: Four times a day (QID) | INTRAVENOUS | Status: DC | PRN

## 2014-07-05 MED ORDER — FENTANYL CITRATE 0.05 MG/ML IJ SOLN
INTRAMUSCULAR | Status: AC
  Filled 2014-07-05: qty 5

## 2014-07-05 MED ORDER — ACETAMINOPHEN 325 MG PO TABS: 650.0000 mg | ORAL_TABLET | Freq: Four times a day (QID) | ORAL | Status: DC | PRN

## 2014-07-05 MED ORDER — ONDANSETRON HCL 4 MG/2ML IJ SOLN
INTRAMUSCULAR | Status: DC | PRN
  Administered 2014-07-05: 4 mg via INTRAVENOUS

## 2014-07-05 MED ORDER — MENTHOL 3 MG MT LOZG: 1.0000 | LOZENGE | OROMUCOSAL | Status: DC | PRN

## 2014-07-05 MED ORDER — ACETAMINOPHEN 500 MG PO TABS
1000.0000 mg | ORAL_TABLET | Freq: Four times a day (QID) | ORAL | Status: AC
  Administered 2014-07-06 (×3): 1000 mg via ORAL
  Filled 2014-07-05 (×3): qty 2

## 2014-07-05 MED ORDER — PHENYLEPHRINE HCL 10 MG/ML IJ SOLN
INTRAMUSCULAR | Status: AC
  Filled 2014-07-05: qty 1

## 2014-07-05 MED ORDER — LACTATED RINGERS IV SOLN
INTRAVENOUS | Status: DC
  Administered 2014-07-06: 01:00:00 via INTRAVENOUS

## 2014-07-05 MED ORDER — FENTANYL CITRATE 0.05 MG/ML IJ SOLN
INTRAMUSCULAR | Status: DC | PRN
  Administered 2014-07-05: 100 ug via INTRAVENOUS

## 2014-07-05 MED ORDER — METOCLOPRAMIDE HCL 10 MG PO TABS: 5.0000 mg | ORAL_TABLET | Freq: Three times a day (TID) | ORAL | Status: DC | PRN

## 2014-07-05 MED ORDER — NEOSTIGMINE METHYLSULFATE 10 MG/10ML IV SOLN
INTRAVENOUS | Status: AC
Start: 2014-07-05 — End: 2014-07-05
  Filled 2014-07-05: qty 1

## 2014-07-05 MED ORDER — PHENOL 1.4 % MT LIQD: 1.0000 | OROMUCOSAL | Status: DC | PRN

## 2014-07-05 MED ORDER — ONDANSETRON HCL 4 MG/2ML IJ SOLN
INTRAMUSCULAR | Status: AC
  Filled 2014-07-05: qty 2

## 2014-07-05 MED ORDER — LIDOCAINE HCL (CARDIAC) 20 MG/ML IV SOLN
INTRAVENOUS | Status: DC | PRN
  Administered 2014-07-05: 80 mg via INTRAVENOUS

## 2014-07-05 MED ORDER — NEOSTIGMINE METHYLSULFATE 10 MG/10ML IV SOLN
INTRAVENOUS | Status: DC | PRN
  Administered 2014-07-05: 5 mg via INTRAVENOUS

## 2014-07-05 MED ORDER — PROPOFOL 10 MG/ML IV BOLUS
INTRAVENOUS | Status: AC
  Filled 2014-07-05: qty 20

## 2014-07-05 MED ORDER — LIDOCAINE HCL (CARDIAC) 20 MG/ML IV SOLN
INTRAVENOUS | Status: AC
  Filled 2014-07-05: qty 5

## 2014-07-05 MED ORDER — DOCUSATE SODIUM 100 MG PO CAPS
100.0000 mg | ORAL_CAPSULE | Freq: Two times a day (BID) | ORAL | Status: DC
  Administered 2014-07-05 – 2014-07-09 (×8): 100 mg via ORAL
  Filled 2014-07-05 (×8): qty 1

## 2014-07-05 MED ORDER — PROPOFOL 10 MG/ML IV BOLUS
INTRAVENOUS | Status: DC | PRN
  Administered 2014-07-05: 140 mg via INTRAVENOUS

## 2014-07-05 MED ORDER — CEFAZOLIN SODIUM-DEXTROSE 2-3 GM-% IV SOLR
INTRAVENOUS | Status: DC | PRN
  Administered 2014-07-05: 2 g via INTRAVENOUS

## 2014-07-05 MED ORDER — MORPHINE SULFATE 2 MG/ML IJ SOLN
0.5000 mg | INTRAMUSCULAR | Status: DC | PRN
  Administered 2014-07-07: 0.5 mg via INTRAVENOUS
  Filled 2014-07-05: qty 1

## 2014-07-05 MED ORDER — HYDROCODONE-ACETAMINOPHEN 5-325 MG PO TABS
1.0000 | ORAL_TABLET | Freq: Four times a day (QID) | ORAL | Status: DC | PRN
  Administered 2014-07-05: 2 via ORAL
  Administered 2014-07-06: 1 via ORAL
  Administered 2014-07-06: 2 via ORAL
  Administered 2014-07-06 (×2): 1 via ORAL
  Administered 2014-07-06 – 2014-07-09 (×10): 2 via ORAL
  Filled 2014-07-05 (×15): qty 2

## 2014-07-05 MED ORDER — ONDANSETRON HCL 4 MG/2ML IJ SOLN: 4.0000 mg | Freq: Four times a day (QID) | INTRAMUSCULAR | Status: DC | PRN

## 2014-07-05 MED ORDER — GLYCOPYRROLATE 0.2 MG/ML IJ SOLN
INTRAMUSCULAR | Status: AC
  Filled 2014-07-05: qty 4

## 2014-07-05 MED ORDER — METOCLOPRAMIDE HCL 5 MG/ML IJ SOLN: 5.0000 mg | Freq: Three times a day (TID) | INTRAMUSCULAR | Status: DC | PRN

## 2014-07-05 MED ORDER — SODIUM CHLORIDE 0.9 % IV SOLN
20.0000 mg | INTRAVENOUS | Status: DC | PRN
  Administered 2014-07-05: 40 ug/min via INTRAVENOUS

## 2014-07-05 MED ORDER — BUPIVACAINE HCL (PF) 0.5 % IJ SOLN
INTRAMUSCULAR | Status: DC | PRN
  Administered 2014-07-05: 10 mL

## 2014-07-05 MED ORDER — FENTANYL CITRATE 0.05 MG/ML IJ SOLN
INTRAMUSCULAR | Status: AC
  Filled 2014-07-05: qty 2

## 2014-07-05 SURGICAL SUPPLY — 40 items
BIT DRILL CANN LG 4.3MM (BIT) IMPLANT
COVER PERINEAL POST (MISCELLANEOUS) ×3 IMPLANT
COVER SURGICAL LIGHT HANDLE (MISCELLANEOUS) ×3 IMPLANT
DRAPE STERI IOBAN 125X83 (DRAPES) ×3 IMPLANT
DRAPE U-SHAPE 47X51 STRL (DRAPES) ×6 IMPLANT
DRILL BIT CANN LG 4.3MM (BIT) ×3
DRSG MEPILEX BORDER 4X4 (GAUZE/BANDAGES/DRESSINGS) ×2 IMPLANT
DRSG MEPILEX BORDER 4X8 (GAUZE/BANDAGES/DRESSINGS) ×3 IMPLANT
ELECT PENCIL ROCKER SW 15FT (MISCELLANEOUS) ×3 IMPLANT
ELECT REM PT RETURN 9FT ADLT (ELECTROSURGICAL) ×3
ELECTRODE REM PT RTRN 9FT ADLT (ELECTROSURGICAL) ×1 IMPLANT
GLOVE BIOGEL PI IND STRL 8.5 (GLOVE) ×1 IMPLANT
GLOVE BIOGEL PI INDICATOR 8.5 (GLOVE) ×2
GLOVE ECLIPSE 8.5 STRL (GLOVE) ×3 IMPLANT
GOWN STRL REUS W/ TWL LRG LVL3 (GOWN DISPOSABLE) ×1 IMPLANT
GOWN STRL REUS W/TWL 2XL LVL3 (GOWN DISPOSABLE) ×6 IMPLANT
GOWN STRL REUS W/TWL LRG LVL3 (GOWN DISPOSABLE) ×3
GUIDEPIN 3.2X17.5 THRD DISP (PIN) ×2 IMPLANT
HIP FR NAIL LAG SCREW 10.5X110 (Orthopedic Implant) ×3 IMPLANT
KIT BASIN OR (CUSTOM PROCEDURE TRAY) ×3 IMPLANT
KIT ROOM TURNOVER OR (KITS) ×3 IMPLANT
LINER BOOT UNIVERSAL DISP (MISCELLANEOUS) ×3 IMPLANT
MANIFOLD NEPTUNE II (INSTRUMENTS) ×3 IMPLANT
NAIL HIP FRACT 130D 11X180 (Screw) ×2 IMPLANT
NS IRRIG 1000ML POUR BTL (IV SOLUTION) ×3 IMPLANT
PACK GENERAL/GYN (CUSTOM PROCEDURE TRAY) ×3 IMPLANT
PAD ARMBOARD 7.5X6 YLW CONV (MISCELLANEOUS) ×6 IMPLANT
SCREW BONE CORTICAL 5.0X36 (Screw) ×2 IMPLANT
SCREW LAG HIP FR NAIL 10.5X110 (Orthopedic Implant) IMPLANT
SPONGE GAUZE 4X4 12PLY STER LF (GAUZE/BANDAGES/DRESSINGS) ×9 IMPLANT
STAPLER VISISTAT 35W (STAPLE) IMPLANT
SURGIFLO TRUKIT (HEMOSTASIS) IMPLANT
SUT BONE WAX W31G (SUTURE) ×3 IMPLANT
SUT VIC AB 0 CT1 27 (SUTURE) ×3
SUT VIC AB 0 CT1 27XBRD ANBCTR (SUTURE) ×1 IMPLANT
SUT VIC AB 1 CT1 18XCR BRD 8 (SUTURE) IMPLANT
SUT VIC AB 1 CT1 8-18 (SUTURE) ×3
SUT VIC AB 2-0 CT1 18 (SUTURE) ×3 IMPLANT
SUT VIC AB 2-0 CTB1 (SUTURE) ×2 IMPLANT
WATER STERILE IRR 1000ML POUR (IV SOLUTION) ×6 IMPLANT

## 2014-07-05 NOTE — Anesthesia Procedure Notes (Signed)
Procedure Name: Intubation Date/Time: 07/05/2014 4:32 PM Performed by: Reine Just Pre-anesthesia Checklist: Patient identified, Emergency Drugs available, Suction available, Patient being monitored and Timeout performed Patient Re-evaluated:Patient Re-evaluated prior to inductionOxygen Delivery Method: Circle system utilized and Simple face mask Preoxygenation: Pre-oxygenation with 100% oxygen Intubation Type: IV induction Ventilation: Mask ventilation without difficulty Laryngoscope Size: Mac and 4 Grade View: Grade I Tube type: Oral Tube size: 7.5 mm Number of attempts: 1 Airway Equipment and Method: Patient positioned with wedge pillow and Stylet Placement Confirmation: ETT inserted through vocal cords under direct vision,  positive ETCO2 and breath sounds checked- equal and bilateral Secured at: 22 cm Tube secured with: Tape Dental Injury: Teeth and Oropharynx as per pre-operative assessment

## 2014-07-05 NOTE — Progress Notes (Signed)
Pt to mover to [facility] post op. Pt's belonings taken to pt's new room in [facility] #19, report given to receiving nurse

## 2014-07-05 NOTE — Progress Notes (Signed)
Utilization review completed. Kamil Mchaffie, RN, BSN. 

## 2014-07-05 NOTE — Anesthesia Preprocedure Evaluation (Signed)
Anesthesia Evaluation  Patient identified by MRN, date of birth, ID band Patient awake    Reviewed: Allergy & Precautions, NPO status , Patient's Chart, lab work & pertinent test results  History of Anesthesia Complications Negative for: history of anesthetic complications  Airway Mallampati: II   Neck ROM: Full    Dental  (+) Poor Dentition, Missing   Pulmonary asthma , COPDformer smoker, PE         Cardiovascular hypertension, + CAD, + Past MI and + Peripheral Vascular Disease Rhythm:Regular Rate:Normal  On Plavix   Neuro/Psych CVA    GI/Hepatic   Endo/Other    Renal/GU      Musculoskeletal   Abdominal   Peds  Hematology  (+) anemia ,   Anesthesia Other Findings   Reproductive/Obstetrics                             Anesthesia Physical Anesthesia Plan  ASA: IV  Anesthesia Plan: General   Post-op Pain Management:    Induction: Intravenous  Airway Management Planned: Oral ETT  Additional Equipment:   Intra-op Plan:   Post-operative Plan: Extubation in OR  Informed Consent: I have reviewed the patients History and Physical, chart, labs and discussed the procedure including the risks, benefits and alternatives for the proposed anesthesia with the patient or authorized representative who has indicated his/her understanding and acceptance.   Dental advisory given  Plan Discussed with: CRNA and Surgeon  Anesthesia Plan Comments:         Anesthesia Quick Evaluation

## 2014-07-05 NOTE — Progress Notes (Signed)
Report called to or nurse, pt is stable, no complaint or concerns stated, pt given pre-surgical bath and clean gown on

## 2014-07-05 NOTE — Brief Op Note (Signed)
07/04/2014 - 07/05/2014  5:41 PM  PATIENT:  [name]  81 y.o. male  PRE-OPERATIVE DIAGNOSIS:  LEFT INTERTROCH FX  POST-OPERATIVE DIAGNOSIS:  LEFT INTERTROCH FX  PROCEDURE:  Procedure(s): INTRAMEDULLARY (IM) NAIL INTERTROCHANTRIC (Left)  SURGEON:  Surgeon(s) and Role:    * Venita Lick, MD - Primary  PHYSICIAN ASSISTANT:   ASSISTANTS: none   ANESTHESIA:   general  EBL:     BLOOD ADMINISTERED:none  DRAINS: none   LOCAL MEDICATIONS USED:  MARCAINE     SPECIMEN:  No Specimen  DISPOSITION OF SPECIMEN:  N/A  COUNTS:  YES  TOURNIQUET:  * No tourniquets in log *  DICTATION: .Other Dictation: Dictation Number [id number]  PLAN OF CARE: Admit to inpatient   PATIENT DISPOSITION:  PACU - hemodynamically stable.

## 2014-07-05 NOTE — Clinical Social Work Placement (Signed)
Clinical Social Work Department CLINICAL SOCIAL WORK PLACEMENT NOTE 07/05/2014  Patient:  [name], [name]  Account Number:  [id number] Admit date:  07/04/2014  Clinical Social Worker:  Stacy Gardner, CLINICAL SOCIAL WORKER  Date/time:  07/05/2014 03:06 PM  Clinical Social Work is seeking post-discharge placement for this patient at the following level of care:   SKILLED NURSING   (*CSW will update this form in Epic as items are completed)   07/05/2014  Patient/family provided with [facility] Department of Clinical Social Work's list of facilities offering this level of care within the geographic area requested by the patient (or if unable, by the patient's family).  07/05/2014  Patient/family informed of their freedom to choose among providers that offer the needed level of care, that participate in Medicare, Medicaid or managed care program needed by the patient, have an available bed and are willing to accept the patient.  07/05/2014  Patient/family informed of [facility]' ownership interest in [facility], as well as of the fact that they are under no obligation to receive care at this facility.  PASARR submitted to EDS on 07/05/2014 PASARR number received on 07/05/2014  FL2 transmitted to all facilities in geographic area requested by pt/family on  07/05/2014 FL2 transmitted to all facilities within larger geographic area on   Patient informed that his/her managed care company has contracts with or will negotiate with  certain facilities, including the following:     Patient/family informed of bed offers received:   Patient chooses bed at  Physician recommends and patient chooses bed at    Patient to be transferred to  on   Patient to be transferred to facility by  Patient and family notified of transfer on  Name of family member notified:    The following physician request were entered in Epic:   Additional Comments:   Stacy Gardner, BSW Intern, [phone]

## 2014-07-05 NOTE — Transfer of Care (Signed)
Immediate Anesthesia Transfer of Care Note  Patient: [name]  Procedure(s) Performed: Procedure(s): INTRAMEDULLARY (IM) NAIL INTERTROCHANTRIC (Left)  Patient Location: PACU  Anesthesia Type:General  Level of Consciousness: awake and alert   Airway & Oxygen Therapy: Patient Spontanous Breathing and Patient connected to nasal cannula oxygen  Post-op Assessment: Report given to RN, Post -op Vital signs reviewed and stable and Patient moving all extremities X 4  Post vital signs: Reviewed and stable  Last Vitals:  Filed Vitals:   07/05/14 1357  BP: 117/71  Pulse: 83  Temp: 36.7 C  Resp: 16    Complications: No apparent anesthesia complications

## 2014-07-05 NOTE — Progress Notes (Signed)
INITIAL NUTRITION ASSESSMENT  DOCUMENTATION CODES Per approved criteria  -Not Applicable   INTERVENTION: -RD to follow for diet advancement -Supplement diet as appropriate  NUTRITION DIAGNOSIS: Inadequate oral intake related to inability to eat as evidenced by NPO.   Goal: Pt will meet >90% of estimated nutritional needs  Monitor:  Diet advancement, PO intake, labs, weight changes, I/O's  Reason for Assessment: Consult to assess needs (Hip Fx Protocol)  81 y.o. male  Admitting Dx: <principal problem not specified>  81 y/o male with history of CAD/MI, PE, HLD, PVD, stroke, and COPD experienced a mechanical fall on the evening of 07/03/2014. The patient had a neighbor help him get back to bed. When the patient woke up on 07/04/2014, he was not able to palpate because of significant pain in his left hip.  ASSESSMENT: Pt in with multiple staff at time of visit, about to go down to surgery for hip fx at time of visit. Unable to complete nutrition focused physical exam at this time. Per previous admissions, pt has a hx of malnutrition due to GI illnesses and poor PO intake. However, pt has gained weight in the past 6 months per chart review (4.4%).  No intake data currently available. RD will continue to follow and add supplements as appropriate.  Discharge disposition is for SNF once medically stable.  Labs reviewed. Creat: 1.36, Glucose: 149.   Height: Ht Readings from Last 1 Encounters:  07/04/14  (1.702 m)    Weight: Wt Readings from Last 1 Encounters:  07/04/14 143 lb 15.4 oz (65.3 kg)    Ideal Body Weight: 148#  % Ideal Body Weight: 97%  Wt Readings from Last 10 Encounters:  07/04/14 143 lb 15.4 oz (65.3 kg)  04/13/14 145 lb (65.772 kg)  01/04/14 136 lb (61.689 kg)  12/27/13 136 lb (61.689 kg)  12/14/13 136 lb (61.689 kg)  09/21/13 124 lb (56.246 kg)  08/01/13 128 lb (58.06 kg)  05/05/13 120 lb 9.5 oz (54.7 kg)  04/19/13 125 lb 10.6 oz (57 kg)  02/28/13  137 lb (62.143 kg)    Usual Body Weight: 136#  % Usual Body Weight: 105%  BMI:  Body mass index is 22.54 kg/(m^2). Normal weight range  Estimated Nutritional Needs: Kcal: 1700-1900 Protein: 80-90 grams Fluid: 1.7-1.9 L  Skin: WDL  Diet Order: Diet NPO time specified  EDUCATION NEEDS: -Education not appropriate at this time   Intake/Output Summary (Last 24 hours) at 07/05/14 1452 Last data filed at 07/05/14 1438  Gross per 24 hour  Intake    240 ml  Output     75 ml  Net    165 ml    Last BM: 07/04/14  Labs:   Recent Labs Lab 07/04/14 1003  NA 140  K 4.0  CL 109  CO2 23  BUN 23  CREATININE 1.36*  CALCIUM 9.0  GLUCOSE 149*    CBG (last 3)  No results for input(s): GLUCAP in the last 72 hours.  Scheduled Meds: . bisoprolol  5 mg Oral Daily  . citalopram  20 mg Oral q morning - 10a  . diltiazem  180 mg Oral q morning - 10a  . Fluticasone Furoate-Vilanterol  1 puff Inhalation q morning - 10a  . isosorbide mononitrate  30 mg Oral q morning - 10a  . lactose free nutrition  237 mL Oral q morning - 10a  . multivitamin with minerals  1 tablet Oral q morning - 10a  . pantoprazole  40 mg Oral  q morning - 10a  . predniSONE  10 mg Oral Q breakfast  . simvastatin  10 mg Oral QPM  . tamsulosin  0.4 mg Oral q morning - 10a  . vitamin C  500 mg Oral q morning - 10a  . zinc sulfate  220 mg Oral q morning - 10a    Continuous Infusions: . sodium chloride 20 mL/hr at 07/04/14 1000    Past Medical History  Diagnosis Date  . Asthma   . Hypertension   . Pulmonary embolism   . Peripheral artery disease   . Hypercholesteremia   . Bursitis of right shoulder   . DVT of leg (deep venous thrombosis)   . Cough   . Trouble swallowing   . Change in voice   . Difficulty urinating   . Easy bruising   . CAD (coronary artery disease) 10/02/2011    Severe 2 Vessel CAD with early Mid LAD 95% focal lesion followed by 60-70% irregular stenoses just after bifurcating SP1 and  AV Groove Cx-OM1/OM2 bifurcation 90% lesion  . Stroke Feb. 2013  . Pneumonia Feb. 2013  . Myocardial infarction Sep 26, 2011  . Fall at home Sept. 2, 2014    Pt fell down 2 stairs  . COPD (chronic obstructive pulmonary disease) Sep 11, 2013    slight touch    Past Surgical History  Procedure Laterality Date  . Hernia repair    . Appendectomy    . Femoral-femoral bypass graft    . Iliac artery stent    . Esophagogastroduodenoscopy  07/03/2011    Procedure: ESOPHAGOGASTRODUODENOSCOPY (EGD);  Surgeon: Shirley Friar, MD;  Location: [facility];  Service: Endoscopy;  Laterality: N/A;  . Compression hip screw  06/26/2011    Procedure: COMPRESSION HIP;  Surgeon: Raymon Mutton, MD;  Location: [facility];  Service: Orthopedics;  Laterality: Right;  . Laparoscopy  07/10/2011    Procedure: LAPAROSCOPY DIAGNOSTIC;  Surgeon: Ardeth Sportsman, MD;  Location: [facility];  Service: General;  Laterality: N/A;  serousal repair,  . Esophagogastroduodenoscopy  09/28/2011    Procedure: ESOPHAGOGASTRODUODENOSCOPY (EGD);  Surgeon: Willis Modena, MD;  Location: [facility];  Service: Endoscopy;  Laterality: N/A;  . Femoral-femoral bypass graft  10/05/2011    Procedure: BYPASS GRAFT FEMORAL-FEMORAL ARTERY;  Surgeon: Larina Earthly, MD;  Location: [facility] OR;  Service: Vascular;  Laterality: Bilateral;  Thrombectomy of Femoral-Femoral artery bypass graft  . Fracture surgery  06/24/11    Right Hip  . Joint replacement  06/24/11    Right Hip  . Small intestine surgery  March 2013  . Ercp N/A 04/20/2013    Procedure: ENDOSCOPIC RETROGRADE CHOLANGIOPANCREATOGRAPHY (ERCP);  Surgeon: Petra Kuba, MD;  Location: [facility];  Service: Endoscopy;  Laterality: N/A;  . Esophagogastroduodenoscopy N/A 05/06/2013    Procedure: ESOPHAGOGASTRODUODENOSCOPY (EGD);  Surgeon: Vertell Novak., MD;  Location: [facility];  Service: Endoscopy;  Laterality: N/A;  . Left heart catheterization with coronary angiogram N/A 10/02/2011    Procedure:  LEFT HEART CATHETERIZATION WITH CORONARY ANGIOGRAM;  Surgeon: Marykay Lex, MD;  Location: [facility] CATH LAB;  Service: Cardiovascular;  Laterality: N/A;    Deion Forgue A. Mayford Knife, RD, LDN, CDE Pager: [phone] After hours Pager: [phone]

## 2014-07-05 NOTE — Progress Notes (Signed)
[facility] PROGRESS NOTE  [name] XEN:[id number] DOB: [birth year] DOA: 07/04/2014 PCP: Katy Apo, MD  Assessment/Plan: 1. L intertrochanteric hip fracture 1. Pt has been seen by Orthopedic surgery and is currently in OR 2. Anticipate will benefit from SNF post-operatively. PT/OT when OK with surgery 2. CAD with hx NSTEMI 1. Pain free 2. Asymptomatic 3. PVD with thrombosed R-L BPG, s/p thrombectomy in 6/13 1. Asymptomatic currently 4. Hx PE s/p IVC filter placement 6/13 1. Pt no longer on anticoagulation secondary to hx of GI bleed 2. Stable 5. Hx CVA 1. Stable 2. Pt with residual L sided hemiparesis 6. COPD 1. Remains stable 2. No wheezing on exam 3. Pt remains on daily home prednisone 7. HTN 1. BP stable 2. Cont current regimen 3. Monitor 8. DVT prophylaxis 1. Heparin subQ  Code Status: Full Family Communication: Pt in room (indicate person spoken with, relationship, and if by phone, the number) Disposition Plan: Pending  Consultants:  Orthopedic Surgery  Procedures:    Antibiotics:    HPI/Subjective: No complaints. Eager to have surgery  Objective: Filed Vitals:   07/05/14 0800 07/05/14 0842 07/05/14 1151 07/05/14 1357  BP:  138/79  117/71  Pulse:  89  83  Temp:  97.8 F (36.6 C)  98 F (36.7 C)  TempSrc:  Oral  Oral  Resp: 16 16 18 16   Height:      Weight:      SpO2: 96% 96% 97% 95%    Intake/Output Summary (Last 24 hours) at 07/05/14 1630 Last data filed at 07/05/14 1438  Gross per 24 hour  Intake    240 ml  Output     75 ml  Net    165 ml   Filed Weights   07/04/14 2052  Weight: 65.3 kg (143 lb 15.4 oz)    Exam:   General:  Awake, in nad  Cardiovascular: regular, s1, s2  Respiratory: normal resp effort, no wheezing  Abdomen: soft,nondistended  Musculoskeletal: perfused,no clubbing   Data Reviewed: Basic Metabolic Panel:  Recent Labs Lab 07/04/14 1003  NA 140  K 4.0  CL 109  CO2 23  GLUCOSE 149*   BUN 23  CREATININE 1.36*  CALCIUM 9.0   Liver Function Tests: No results for input(s): AST, ALT, ALKPHOS, BILITOT, PROT, ALBUMIN in the last 168 hours. No results for input(s): LIPASE, AMYLASE in the last 168 hours. No results for input(s): AMMONIA in the last 168 hours. CBC:  Recent Labs Lab 07/04/14 1003  WBC 8.1  NEUTROABS 7.0  HGB 12.0*  HCT 37.8*  MCV 85.3  PLT 196   Cardiac Enzymes: No results for input(s): CKTOTAL, CKMB, CKMBINDEX, TROPONINI in the last 168 hours. BNP (last 3 results) No results for input(s): BNP in the last 8760 hours.  ProBNP (last 3 results) No results for input(s): PROBNP in the last 8760 hours.  CBG: No results for input(s): GLUCAP in the last 168 hours.  No results found for this or any previous visit (from the past 240 hour(s)).   Studies: Dg Chest 1 View  07/04/2014   CLINICAL DATA:  Known left hip fracture  EXAM: CHEST  1 VIEW  COMPARISON:  09/12/2013  FINDINGS: Cardiac shadow is stable. The lungs are well aerated bilaterally. Focal area of increased density is noted in the right upper lobe which may represent some early atelectasis. Mild vascular prominence is seen. Coronary stenting is noted.  IMPRESSION: Mild right upper lobe atelectasis  Mild vascular prominence.   Electronically Signed  By: Alcide Clever M.D.   On: 07/04/2014 10:29   Dg Hip Unilat With Pelvis 2-3 Views Left  07/04/2014   CLINICAL DATA:  Fall last evening with persistent left hip pain, initial encounter  EXAM: LEFT HIP (WITH PELVIS) 2-3 VIEWS  COMPARISON:  None.  FINDINGS: Postsurgical changes are noted in the proximal right femur. The pelvic ring is intact. Postsurgical changes are noted within the abdomen as well as within the iliac artery on the right.  A comminuted intratrochanteric fracture with impaction and angulation at the fracture site is noted. No gross soft tissue abnormality is seen.  IMPRESSION: Left intertrochanteric fracture   Electronically Signed   By: Alcide Clever M.D.   On: 07/04/2014 10:28    Scheduled Meds: . bisoprolol  5 mg Oral Daily  . citalopram  20 mg Oral q morning - 10a  . diltiazem  180 mg Oral q morning - 10a  . Fluticasone Furoate-Vilanterol  1 puff Inhalation q morning - 10a  . isosorbide mononitrate  30 mg Oral q morning - 10a  . lactose free nutrition  237 mL Oral q morning - 10a  . multivitamin with minerals  1 tablet Oral q morning - 10a  . pantoprazole  40 mg Oral q morning - 10a  . predniSONE  10 mg Oral Q breakfast  . simvastatin  10 mg Oral QPM  . tamsulosin  0.4 mg Oral q morning - 10a  . vitamin C  500 mg Oral q morning - 10a  . zinc sulfate  220 mg Oral q morning - 10a   Continuous Infusions: . sodium chloride 20 mL/hr at 07/04/14 1000  . 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 If 7PM-7AM, please contact night-coverage at [email or web address], password [facility] 07/05/2014, 4:30 PM  LOS: 1 day

## 2014-07-06 ENCOUNTER — Encounter (HOSPITAL_COMMUNITY): Payer: Self-pay | Admitting: Orthopedic Surgery

## 2014-07-06 LAB — BASIC METABOLIC PANEL
Anion gap: 11 (ref 5–15)
BUN: 29 mg/dL — ABNORMAL HIGH (ref 6–23)
CO2: 24 mmol/L (ref 19–32)
Calcium: 8.3 mg/dL — ABNORMAL LOW (ref 8.4–10.5)
Chloride: 103 mmol/L (ref 96–112)
Creatinine, Ser: 1.58 mg/dL — ABNORMAL HIGH (ref 0.50–1.35)
GFR, EST AFRICAN AMERICAN: 47 mL/min — AB (ref >90)
GFR, EST NON AFRICAN AMERICAN: 40 mL/min — AB (ref >90)
Glucose, Bld: 112 mg/dL — ABNORMAL HIGH (ref 70–99)
Potassium: 4.1 mmol/L (ref 3.5–5.1)
SODIUM: 138 mmol/L (ref 135–145)

## 2014-07-06 LAB — CBC
HCT: 31.2 % — ABNORMAL LOW (ref 39.0–52.0)
Hemoglobin: 9.7 g/dL — ABNORMAL LOW (ref 13.0–17.0)
MCH: 26.6 pg (ref 26.0–34.0)
MCHC: 31.1 g/dL (ref 30.0–36.0)
MCV: 85.7 fL (ref 78.0–100.0)
PLATELETS: 183 10*3/uL (ref 150–400)
RBC: 3.64 MIL/uL — ABNORMAL LOW (ref 4.22–5.81)
RDW: 15.8 % — ABNORMAL HIGH (ref 11.5–15.5)
WBC: 6.5 10*3/uL (ref 4.0–10.5)

## 2014-07-06 MED ORDER — SODIUM CHLORIDE 0.9 % IV SOLN
INTRAVENOUS | Status: DC
  Administered 2014-07-08: 1 mL via INTRAVENOUS
  Administered 2014-07-09: 06:00:00 via INTRAVENOUS

## 2014-07-06 NOTE — Op Note (Signed)
NAMERAWLINS, BOOKWALTER NO.:  [id number]  MEDICAL RECORD NO.:  [id number]  LOCATION:  5N19C                        FACILITY:  [facility]  PHYSICIAN:  Alvy Beal, MD    DATE OF BIRTH:  04/25/1934  DATE OF PROCEDURE:  07/05/2014 DATE OF DISCHARGE:                              OPERATIVE REPORT   PREOPERATIVE DIAGNOSIS:  Left intertrochanteric hip fracture.  POSTOPERATIVE DIAGNOSIS:  Left intertrochanteric hip fracture.  OPERATIVE PROCEDURE:  Open reduction and internal fixation with intertrochanteric nail.  COMPLICATIONS:  None.  CONDITION:  Stable.  HISTORY:  This is a 81 year old gentleman who unfortunately had a mechanical fall yesterday and presented to the ER with a fracture. After medical evaluation and clearance, we elected to take him to the operating room for a fixation.  All appropriate risks, benefits, and alternatives were discussed with the patient.  Consent was obtained.  OPERATIVE NOTE:  The patient was brought to the operating room, placed supine on the Hana bed.  After successful induction of general anesthesia and endotracheal intubation, the legs were placed in well leg holders.  The left which was identified preoperatively as the fracture site was placed into traction and gentle fracture reduction maneuver was performed and maintained in place.  The right leg was left slightly extended in the boot.  Once the legs were scissored, the left hip was prepped and draped in a standard fashion.  Time-out was taken confirming the patient, procedure, and all other pertinent important data.  Once this was done, a small stab incision was made proximal to the greater trochanter.  A guide pin was advanced percutaneously down to the tip of the trochanter, advanced through the trochanter and into the proximal femur.  I confirmed satisfactory trajectory in positioning in both the AP and lateral planes.  Once this was done, I enlarged the incision and then  advanced the one-step reamer over the guide pin.  This was reamed down to the proximal and induced through the greater trochanter.  Once this was done, I obtained a short trochanteric nail and advanced it across the fracture site and into the proximal femur.  It was well seated.  I then made another incision and placed the lag screw.  I drilled across with the guide pin confirming satisfactory position in both planes.  I then used the one-step drill to place a 110 mm lag screw.  This screw had good purchase.  The tip of apex distance was within 2 mm in both the AP and lateral planes.  Satisfied with the trajectory and position, I locked the screws down.  I then placed a single distal locking screw again using the targeting guide through the device.  This was 36 mm distal locking screw.  I then removed all of the targeting instrumentation, and took final AP and lateral x-rays. Fracture was well reduced in both planes.  Hardware was in good position in both planes.  At this point, all wounds were copiously irrigated with normal saline.  Deep layers were closed with interrupted #1 Vicryl sutures, superficial with 2-0 Vicryl sutures, and staples for the skin.  Dry dressing was applied. The patient was extubated, transferred to  PACU without incident.  At the end of the case, all needle and sponge counts were correct.  There were no adverse operative events.     Alvy Beal, MD     DDB/MEDQ  D:  07/05/2014  T:  07/06/2014  Job:  [id number]

## 2014-07-06 NOTE — Accreditation Note (Signed)
CARE MANAGEMENT NOTE 07/06/2014  Patient:  [name], [name]   Account Number:  [id number]  Date Initiated:  07/06/2014  Documentation initiated by:  Vance Peper  Subjective/Objective Assessment:   81 yr old male admitted with left intertrochanter fracture.patient under went left hip IM Nailing. Patient has been at [facility] and will not return there.     Action/Plan:   CM spoke with patient concerning discharge needs. Social worker notified of need for SNF. Patient has family support at discharge.  [facility] would be choice for HH. Has RW and W/C.   Anticipated DC Date:  07/09/2014   Anticipated DC Plan:  SKILLED NURSING FACILITY  In-house referral  Clinical Social Worker      DC Planning Services  CM consult      [facility] Choice  NA   Choice offered to / List presented to:     DME arranged  NA        HH arranged  NA      Status of service:  Completed, signed off Medicare Important Message given?   (If response is "NO", the following Medicare IM given date fields will be blank) Date Medicare IM given:   Medicare IM given by:   Date Additional Medicare IM given:   Additional Medicare IM given by:    Discharge Disposition:  SKILLED NURSING FACILITY  Per UR Regulation:  Reviewed for med. necessity/level of care/duration of stay

## 2014-07-06 NOTE — Progress Notes (Signed)
    Subjective: 1 Day Post-Op Procedure(s) (LRB): INTRAMEDULLARY (IM) NAIL INTERTROCHANTRIC (Left) Patient reports pain as 4 on 0-10 scale.   Denies CP or SOB.  Voiding without difficulty. Positive flatus. Objective: Vital signs in last 24 hours: Temp:  [97.3 F (36.3 C)-98 F (36.7 C)] 97.7 F (36.5 C) (03/04 0449) Pulse Rate:  [69-89] 73 (03/04 0449) Resp:  [13-18] 16 (03/04 0449) BP: (114-138)/(52-79) 125/63 mmHg (03/04 0449) SpO2:  [94 %-99 %] 97 % (03/04 0449)  Intake/Output from previous day: 03/03 0701 - 03/04 0700 In: 600 [I.V.:600] Out: 375 [Urine:175; Blood:200] Intake/Output this shift: Total I/O In: 1305.2 [P.O.:120; I.V.:1085.2; IV Piggyback:100] Out: -   Labs:  Recent Labs  07/04/14 1003 07/06/14 0555  HGB 12.0* 9.7*    Recent Labs  07/04/14 1003 07/06/14 0555  WBC 8.1 6.5  RBC 4.43 3.64*  HCT 37.8* 31.2*  PLT 196 183    Recent Labs  07/04/14 1003 07/06/14 0555  NA 140 138  K 4.0 4.1  CL 109 103  CO2 23 24  BUN 23 29*  CREATININE 1.36* 1.58*  GLUCOSE 149* 112*  CALCIUM 9.0 8.3*   No results for input(s): LABPT, INR in the last 72 hours.  Physical Exam: Neurologically intact Neurovascular intact Intact pulses distally Incision: dressing C/D/I Compartment soft  Assessment/Plan: 1 Day Post-Op Procedure(s) (LRB): INTRAMEDULLARY (IM) NAIL INTERTROCHANTRIC (Left) Advance diet Up with therapy  DVT prevention - plavix and ASA Pain control - norco Continue care  - advance per protocal  Emersen Carroll D for Dr. Venita Lick [facility] [phone] 07/06/2014, 8:11 AM

## 2014-07-06 NOTE — Evaluation (Signed)
Physical Therapy Evaluation Patient Details Name: [name] MRN: [id number] DOB: August 29, 1933 Today's Date: 07/06/2014   History of Present Illness  81 yr old male admitted with left intertrochanter fracture.patient under went left hip IM Nailing.  Clinical Impression  Patient is s/p above surgery resulting in functional limitations due to the deficits listed below (see PT Problem List). Patient will benefit from skilled PT to increase their independence and safety with mobility to allow discharge to the venue listed below.  Evaluation limited to sitting EOB due to pain.   Follow Up Recommendations SNF;Supervision/Assistance - 24 hour    Equipment Recommendations  None recommended by PT    Recommendations for Other Services       Precautions / Restrictions Precautions Precautions: Fall Restrictions Weight Bearing Restrictions: Yes LLE Weight Bearing: Weight bearing as tolerated      Mobility  Bed Mobility Overal bed mobility: Needs Assistance Bed Mobility: Sit to Supine;Supine to Sit     Supine to sit: Mod assist;HOB elevated Sit to supine: Mod assist   General bed mobility comments: pt requiring incr time due to pain; (A) to bring bil LEs off EOB and back into supine; heavy use of handrails  Transfers                 General transfer comment: pt refusing due to pain  Ambulation/Gait                Stairs            Wheelchair Mobility    Modified Rankin (Stroke Patients Only)       Balance Overall balance assessment: Needs assistance;History of Falls Sitting-balance support: Feet supported;Single extremity supported;Bilateral upper extremity supported Sitting balance-Leahy Scale: Poor Sitting balance - Comments: leaning to Rt and posteriorly; tolerated sitting ~5 min; c/o pain  Postural control: Posterior lean;Right lateral lean                                   Pertinent Vitals/Pain Pain Assessment: 0-10 Pain Score:  10-Worst pain ever Pain Location: Lt hip with any movement  Pain Descriptors / Indicators: Grimacing;Sharp;Shooting Pain Intervention(s): Monitored during session;Premedicated before session;Repositioned;Limited activity within patient's tolerance    Home Living Family/patient expects to be discharged to:: Skilled nursing facility Living Arrangements: Spouse/significant other               Additional Comments: wife recovering from surgery; requesting SNF     Prior Function Level of Independence: Independent with assistive device(s)         Comments: ambulating with RW     Hand Dominance        Extremity/Trunk Assessment   Upper Extremity Assessment: Defer to OT evaluation           Lower Extremity Assessment: Generalized weakness      Cervical / Trunk Assessment: Normal  Communication   Communication: No difficulties  Cognition Arousal/Alertness: Awake/alert Behavior During Therapy: WFL for tasks assessed/performed Overall Cognitive Status: Within Functional Limits for tasks assessed                      General Comments      Exercises General Exercises - Lower Extremity Ankle Circles/Pumps: AAROM;Both;10 reps;Supine Heel Slides: AAROM;Left;5 reps;Limitations Heel Slides Limitations: pain      Assessment/Plan    PT Assessment Patient needs continued PT services  PT Diagnosis Difficulty walking;Generalized weakness;Acute pain  PT Problem List Decreased strength;Decreased activity tolerance;Decreased balance;Decreased mobility;Decreased knowledge of use of DME;Decreased safety awareness;Pain  PT Treatment Interventions DME instruction;Gait training;Functional mobility training;Therapeutic activities;Therapeutic exercise;Balance training;Neuromuscular re-education;Patient/family education   PT Goals (Current goals can be found in the Care Plan section) Acute Rehab PT Goals Patient Stated Goal: to go to rehab then home PT Goal Formulation:  With patient Time For Goal Achievement: 07/13/14 Potential to Achieve Goals: Fair    Frequency Min 3X/week   Barriers to discharge Decreased caregiver support wife unable to provide physical (A)    Co-evaluation               End of Session Equipment Utilized During Treatment: Oxygen Activity Tolerance: Patient limited by pain Patient left: in bed;with call bell/phone within reach;with SCD's reapplied Nurse Communication: Mobility status;Precautions         Time: 4098-1191 PT Time Calculation (min) (ACUTE ONLY): 17 min   Charges:   PT Evaluation $Initial PT Evaluation Tier I: 1 Procedure     PT G CodesDonell Sievert, [place]  [phone] 07/06/2014, 1:08 PM

## 2014-07-06 NOTE — Care Management Note (Signed)
07/06/14 11:30am Vance Peper, RN BSN Case Manager  2nd IM message given.

## 2014-07-06 NOTE — Progress Notes (Signed)
[facility] PROGRESS NOTE  [name] FPO:[id number] DOB: 04/22/1934 DOA: 07/04/2014 PCP: Katy Apo, MD  Assessment/Plan: 1. L intertrochanteric hip fracture 1. Pt has been seen by Orthopedic surgery and is now s/p surgery on 3/3 2. Pt has been seen by PT with recs for SNF 3. Continue with analgesics as tolerated 2. CAD with hx NSTEMI 1. Remains pain free 2. Asymptomatic 3. PVD with thrombosed R-L BPG, s/p thrombectomy in 6/13 1. Asymptomatic currently 4. Hx PE s/p IVC filter placement 6/13 1. Pt no longer on anticoagulation secondary to hx of GI bleed 2. Stable 5. Hx CVA 1. Stable 2. Pt with residual L sided hemiparesis 6. COPD 1. Remains stable 2. No wheezing on exam 3. Pt remains on daily home prednisone 7. HTN 1. BP stable 2. Cont current regimen 3. Monitor 8. DVT prophylaxis 1. Heparin subQ  Code Status: Full Family Communication: Pt in room  Disposition Plan: Pending SNF placement  Consultants:  Orthopedic Surgery  Procedures:    Antibiotics:    HPI/Subjective: Pt is in good spirits with out complaints. Reports post-op pain is controlled with current regimen  Objective: Filed Vitals:   07/06/14 0000 07/06/14 0400 07/06/14 0449 07/06/14 1452  BP:   125/63 128/71  Pulse:   73 91  Temp:   97.7 F (36.5 C) 97.7 F (36.5 C)  TempSrc:   Oral Oral  Resp: 17 17 16 16   Height:      Weight:      SpO2: 94%  97% 93%    Intake/Output Summary (Last 24 hours) at 07/06/14 1626 Last data filed at 07/06/14 1606  Gross per 24 hour  Intake 1905.17 ml  Output    475 ml  Net 1430.17 ml   Filed Weights   07/04/14 2052  Weight: 65.3 kg (143 lb 15.4 oz)    Exam:   General:  Awake, in nad  Cardiovascular: regular, s1, s2  Respiratory: normal resp effort, no wheezing  Abdomen: soft,nondistended  Musculoskeletal: perfused,no clubbing, post-op dressings in place  Data Reviewed: Basic Metabolic Panel:  Recent Labs Lab 07/04/14 1003  07/06/14 0555  NA 140 138  K 4.0 4.1  CL 109 103  CO2 23 24  GLUCOSE 149* 112*  BUN 23 29*  CREATININE 1.36* 1.58*  CALCIUM 9.0 8.3*   Liver Function Tests: No results for input(s): AST, ALT, ALKPHOS, BILITOT, PROT, ALBUMIN in the last 168 hours. No results for input(s): LIPASE, AMYLASE in the last 168 hours. No results for input(s): AMMONIA in the last 168 hours. CBC:  Recent Labs Lab 07/04/14 1003 07/06/14 0555  WBC 8.1 6.5  NEUTROABS 7.0  --   HGB 12.0* 9.7*  HCT 37.8* 31.2*  MCV 85.3 85.7  PLT 196 183   Cardiac Enzymes: No results for input(s): CKTOTAL, CKMB, CKMBINDEX, TROPONINI in the last 168 hours. BNP (last 3 results) No results for input(s): BNP in the last 8760 hours.  ProBNP (last 3 results) No results for input(s): PROBNP in the last 8760 hours.  CBG: No results for input(s): GLUCAP in the last 168 hours.  No results found for this or any previous visit (from the past 240 hour(s)).   Studies: Pelvis Portable  07/05/2014   CLINICAL DATA:  Postop left femoral fracture fixation.  EXAM: PORTABLE PELVIS 1-2 VIEWS  COMPARISON:  Radiographs 07/04/2014.  FINDINGS: 1751 hours. Interval ORIF of the comminuted intertrochanteric left femur fracture with an intramedullary nail and dynamic compression screw. Hardware appears well positioned. The distal end  of intramedullary nail is not visualized. Chronic postsurgical changes in the right proximal femur are stable. There is no evidence of acute pelvic fracture or dislocation. Right iliac vascular stent, IVC filter and postsurgical changes from hernia repair noted.  IMPRESSION: Interval ORIF of left intertrochanteric femur fracture without demonstrated complication.   Electronically Signed   By: Carey Bullocks M.D.   On: 07/05/2014 18:10   Dg Hip Operative Unilat With Pelvis Left  07/05/2014   CLINICAL DATA:  ORIF left hip  EXAM: OPERATIVE LEFT HIP (WITH PELVIS IF PERFORMED) 5 VIEWS  TECHNIQUE: Fluoroscopic spot image(s)  were submitted for interpretation post-operatively.  FLUOROSCOPY TIME:  50 seconds.  Total images: 5  COMPARISON:  None.  FINDINGS: Five intraoperative fluoroscopic spot images demonstrate interval placement of a left femoral intramedullary nail and interlocking interlocking femoral neck screw transfixing an intertrochanteric fracture. There is no dislocation. The alignment is near anatomic.  IMPRESSION: ORIF left intertrochanteric fracture.   Electronically Signed   By: Elige Ko   On: 07/05/2014 17:39    Scheduled Meds: . acetaminophen  1,000 mg Oral 4 times per day  . docusate sodium  100 mg Oral BID  . ferrous sulfate  325 mg Oral TID PC   Continuous Infusions: . sodium chloride    . 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 If 7PM-7AM, please contact night-coverage at [email or web address], password [facility] 07/06/2014, 4:26 PM  LOS: 2 days

## 2014-07-06 NOTE — Progress Notes (Signed)
OT Cancellation Note  Patient Details Name: [name] MRN: [id number] DOB: 06/22/1933   Cancelled Treatment:    Reason Eval/Treat Not Completed: Pain limiting ability to participate  Glennon Hamilton, OTR/L Occupational Therapist [phone] (pager)  07/06/2014, 5:02 PM

## 2014-07-07 ENCOUNTER — Inpatient Hospital Stay (HOSPITAL_COMMUNITY): Payer: Commercial Managed Care - HMO

## 2014-07-07 DIAGNOSIS — K429 Umbilical hernia without obstruction or gangrene: Secondary | ICD-10-CM

## 2014-07-07 LAB — BASIC METABOLIC PANEL
ANION GAP: 5 (ref 5–15)
BUN: 24 mg/dL — ABNORMAL HIGH (ref 6–23)
CO2: 25 mmol/L (ref 19–32)
Calcium: 8.3 mg/dL — ABNORMAL LOW (ref 8.4–10.5)
Chloride: 109 mmol/L (ref 96–112)
Creatinine, Ser: 1.39 mg/dL — ABNORMAL HIGH (ref 0.50–1.35)
GFR calc Af Amer: 54 mL/min — ABNORMAL LOW (ref >90)
GFR calc non Af Amer: 47 mL/min — ABNORMAL LOW (ref >90)
Glucose, Bld: 105 mg/dL — ABNORMAL HIGH (ref 70–99)
POTASSIUM: 4.3 mmol/L (ref 3.5–5.1)
Sodium: 139 mmol/L (ref 135–145)

## 2014-07-07 LAB — CBC
HCT: 32.8 % — ABNORMAL LOW (ref 39.0–52.0)
Hemoglobin: 10.2 g/dL — ABNORMAL LOW (ref 13.0–17.0)
MCH: 26.7 pg (ref 26.0–34.0)
MCHC: 31.1 g/dL (ref 30.0–36.0)
MCV: 85.9 fL (ref 78.0–100.0)
Platelets: 169 10*3/uL (ref 150–400)
RBC: 3.82 MIL/uL — ABNORMAL LOW (ref 4.22–5.81)
RDW: 15.5 % (ref 11.5–15.5)
WBC: 6.8 10*3/uL (ref 4.0–10.5)

## 2014-07-07 MED ORDER — ENOXAPARIN SODIUM 40 MG/0.4ML ~~LOC~~ SOLN
40.0000 mg | SUBCUTANEOUS | Status: DC
  Administered 2014-07-07 – 2014-07-09 (×3): 40 mg via SUBCUTANEOUS
  Filled 2014-07-07 (×3): qty 0.4

## 2014-07-07 MED ORDER — CLOPIDOGREL BISULFATE 75 MG PO TABS
75.0000 mg | ORAL_TABLET | Freq: Every morning | ORAL | Status: DC
  Administered 2014-07-07 – 2014-07-09 (×3): 75 mg via ORAL
  Filled 2014-07-07 (×3): qty 1

## 2014-07-07 MED ORDER — DILTIAZEM HCL ER 180 MG PO CP24
180.0000 mg | ORAL_CAPSULE | Freq: Every morning | ORAL | Status: DC
  Administered 2014-07-07 – 2014-07-09 (×3): 180 mg via ORAL
  Filled 2014-07-07 (×6): qty 1

## 2014-07-07 MED ORDER — VITAMIN C 500 MG PO TABS
500.0000 mg | ORAL_TABLET | Freq: Every morning | ORAL | Status: DC
  Administered 2014-07-07 – 2014-07-09 (×3): 500 mg via ORAL
  Filled 2014-07-07 (×3): qty 1

## 2014-07-07 MED ORDER — ALPRAZOLAM 0.25 MG PO TABS
0.2500 mg | ORAL_TABLET | Freq: Every day | ORAL | Status: DC | PRN
  Administered 2014-07-08: 0.25 mg via ORAL
  Filled 2014-07-07: qty 1

## 2014-07-07 MED ORDER — ASPIRIN EC 81 MG PO TBEC
81.0000 mg | DELAYED_RELEASE_TABLET | Freq: Every evening | ORAL | Status: DC
  Administered 2014-07-07 – 2014-07-08 (×2): 81 mg via ORAL
  Filled 2014-07-07 (×2): qty 1

## 2014-07-07 MED ORDER — BOOST PO LIQD
237.0000 mL | Freq: Every morning | ORAL | Status: DC
  Administered 2014-07-09: 237 mL via ORAL
  Filled 2014-07-07 (×4): qty 237

## 2014-07-07 MED ORDER — CITALOPRAM HYDROBROMIDE 20 MG PO TABS
20.0000 mg | ORAL_TABLET | Freq: Every morning | ORAL | Status: DC
  Administered 2014-07-07 – 2014-07-09 (×3): 20 mg via ORAL
  Filled 2014-07-07 (×3): qty 1

## 2014-07-07 MED ORDER — PREDNISONE 10 MG PO TABS
10.0000 mg | ORAL_TABLET | Freq: Every day | ORAL | Status: DC
  Administered 2014-07-08 – 2014-07-09 (×2): 10 mg via ORAL
  Filled 2014-07-07 (×2): qty 1

## 2014-07-07 MED ORDER — PANTOPRAZOLE SODIUM 40 MG PO TBEC
40.0000 mg | DELAYED_RELEASE_TABLET | Freq: Every morning | ORAL | Status: DC
  Administered 2014-07-07 – 2014-07-09 (×3): 40 mg via ORAL
  Filled 2014-07-07 (×3): qty 1

## 2014-07-07 MED ORDER — POLYETHYLENE GLYCOL 3350 17 G PO PACK
17.0000 g | PACK | Freq: Every day | ORAL | Status: DC
  Administered 2014-07-08 – 2014-07-09 (×2): 17 g via ORAL
  Filled 2014-07-07 (×2): qty 1

## 2014-07-07 MED ORDER — TAMSULOSIN HCL 0.4 MG PO CAPS
0.4000 mg | ORAL_CAPSULE | Freq: Every morning | ORAL | Status: DC
  Administered 2014-07-07 – 2014-07-09 (×3): 0.4 mg via ORAL
  Filled 2014-07-07 (×3): qty 1

## 2014-07-07 MED ORDER — BISOPROLOL FUMARATE 5 MG PO TABS
5.0000 mg | ORAL_TABLET | Freq: Every day | ORAL | Status: DC
  Administered 2014-07-08 – 2014-07-09 (×2): 5 mg via ORAL
  Filled 2014-07-07 (×3): qty 1

## 2014-07-07 MED ORDER — SIMVASTATIN 10 MG PO TABS
10.0000 mg | ORAL_TABLET | Freq: Every evening | ORAL | Status: DC
  Administered 2014-07-07 – 2014-07-08 (×2): 10 mg via ORAL
  Filled 2014-07-07 (×2): qty 1

## 2014-07-07 MED ORDER — FLUTICASONE FUROATE-VILANTEROL 100-25 MCG/INH IN AEPB: 1.0000 | INHALATION_SPRAY | Freq: Every morning | RESPIRATORY_TRACT | Status: DC

## 2014-07-07 MED ORDER — ISOSORBIDE MONONITRATE ER 30 MG PO TB24
30.0000 mg | ORAL_TABLET | Freq: Every morning | ORAL | Status: DC
  Administered 2014-07-07 – 2014-07-09 (×3): 30 mg via ORAL
  Filled 2014-07-07 (×3): qty 1

## 2014-07-07 MED ORDER — ZINC SULFATE 220 (50 ZN) MG PO CAPS
220.0000 mg | ORAL_CAPSULE | Freq: Every morning | ORAL | Status: DC
  Administered 2014-07-08 – 2014-07-09 (×2): 220 mg via ORAL
  Filled 2014-07-07 (×3): qty 1

## 2014-07-07 NOTE — Progress Notes (Signed)
Orthopedic Tech Progress Note Patient Details:  [name] 1934/04/16 [id number]  Patient ID: [name], male   DOB: Jun 16, 1933, 81 y.o.   MRN: [id number] Pt unable to use trapeze bar patient helper; RN notified  Nikki Dom 07/07/2014, 9:08 AM

## 2014-07-07 NOTE — Progress Notes (Addendum)
Subjective: 2 Days Post-Op Procedure(s) (LRB): INTRAMEDULLARY (IM) NAIL INTERTROCHANTRIC (Left) Patient reports pain as mild.  Gets dizzy with PT.  No n/v.  Tolerating regular diet.  Objective: Vital signs in last 24 hours: Temp:  [97.5 F (36.4 C)-97.7 F (36.5 C)] 97.7 F (36.5 C) (03/05 0524) Pulse Rate:  [85-91] 91 (03/05 0524) Resp:  [16-17] 17 (03/05 0400) BP: (128-141)/(71-76) 141/76 mmHg (03/05 0524) SpO2:  [93 %-96 %] 96 % (03/05 0524)  Intake/Output from previous day: 03/04 0701 - 03/05 0700 In: 1605.2 [P.O.:420; I.V.:1085.2; IV Piggyback:100] Out: 350 [Urine:350] Intake/Output this shift: Total I/O In: 1120 [P.O.:220; I.V.:900] Out: -    Recent Labs  07/04/14 1003 07/06/14 0555 07/07/14 0415  HGB 12.0* 9.7* 10.2*    Recent Labs  07/06/14 0555 07/07/14 0415  WBC 6.5 6.8  RBC 3.64* 3.82*  HCT 31.2* 32.8*  PLT 183 169    Recent Labs  07/06/14 0555 07/07/14 0415  NA 138 139  K 4.1 4.3  CL 103 109  CO2 24 25  BUN 29* 24*  CREATININE 1.58* 1.39*  GLUCOSE 112* 105*  CALCIUM 8.3* 8.3*   No results for input(s): LABPT, INR in the last 72 hours.  PE:  elderly male in nad.  L hip dressed and dry.  Palpable pulses in foot.  5/5 strength in PF and DF of the ankle.  Assessment/Plan: 2 Days Post-Op Procedure(s) (LRB): INTRAMEDULLARY (IM) NAIL INTERTROCHANTRIC (Left) Up with therapy  WBAT on L LE.   Started Lovenox for DVT prophylaxis.  Toni Arthurs 07/07/2014, 9:50 AM

## 2014-07-07 NOTE — Progress Notes (Signed)
[facility] PROGRESS NOTE  [name]:[id number] DOB: January 08, 1934 DOA: 07/04/2014 PCP: Katy Apo, MD  Assessment/Plan: 1. L intertrochanteric hip fracture 1. Pt has been seen by Orthopedic surgery and is s/p surgery on 3/3 2. Pt has been seen by PT/OT with recs for SNF 3. Continue with analgesics as tolerated 4. Pending placement 2. CAD with hx NSTEMI 1. Remains pain free 2. Asymptomatic 3. Cont asa and plavix 4. Cont imdur 5. Cont beta blocker per home regimen 3. PVD with thrombosed R-L BPG, s/p thrombectomy in 6/13 1. Asymptomatic currently 2. Continued on ASA and plavix 4. Hx PE s/p IVC filter placement 6/13 1. Pt no longer on full anticoagulation secondary to hx of GI bleed 2. Stable 5. Hx CVA 1. Stable 2. Pt with residual L sided hemiparesis 3. On ASA and plavix 6. COPD 1. Remains stable 2. No wheezing on exam 3. Pt remains on daily home prednisone 7. HTN 1. BP remains stable 2. Cont current regimen of beta blocker, cardizem 3. Monitor 8. DVT prophylaxis 1. Heparin subQ 9. Abd distension 1. Pos bowel sounds on exam 2. Pt does have notable umbilical hernia, which is already known 3. Abd seems more distended today 4. Will obtain abd xray to r/o obstruction, especially as pt reports a prior hx of SBO  Code Status: Full Family Communication: Pt in room  Disposition Plan: Pending SNF placement  Consultants:  Orthopedic Surgery  Procedures:    Antibiotics:    HPI/Subjective: No complaints. Pain worse while sitting  Objective: Filed Vitals:   07/07/14 0400 07/07/14 0524 07/07/14 0929 07/07/14 1500  BP:  141/76 153/77 148/72  Pulse:  91 84 82  Temp:  97.7 F (36.5 C) 97.7 F (36.5 C) 97.6 F (36.4 C)  TempSrc:  Oral Oral Oral  Resp: 17   18  Height:      Weight:      SpO2:  96% 93% 95%    Intake/Output Summary (Last 24 hours) at 07/07/14 1712 Last data filed at 07/07/14 1300  Gross per 24 hour  Intake   1375 ml  Output     350 ml  Net   1025 ml   Filed Weights   07/04/14 2052  Weight: 65.3 kg (143 lb 15.4 oz)    Exam:   General:  Awake, sitting in chair, in nad  Cardiovascular: regular, s1, s2  Respiratory: normal resp effort, no wheezing  Abdomen: distended, pos BS, nontender  Musculoskeletal: perfused,no clubbing, post-op dressings in place  Data Reviewed: Basic Metabolic Panel:  Recent Labs Lab 07/04/14 1003 07/06/14 0555 07/07/14 0415  NA 140 138 139  K 4.0 4.1 4.3  CL 109 103 109  CO2 GLUCOSE 149* 112* 105*  BUN 23 29* 24*  CREATININE 1.36* 1.58* 1.39*  CALCIUM 9.0 8.3* 8.3*   Liver Function Tests: No results for input(s): AST, ALT, ALKPHOS, BILITOT, PROT, ALBUMIN in the last 168 hours. No results for input(s): LIPASE, AMYLASE in the last 168 hours. No results for input(s): AMMONIA in the last 168 hours. CBC:  Recent Labs Lab 07/04/14 1003 07/06/14 0555 07/07/14 0415  WBC 8.1 6.5 6.8  NEUTROABS 7.0  --   --   HGB 12.0* 9.7* 10.2*  HCT 37.8* 31.2* 32.8*  MCV 85.3 85.7 85.9  PLT 196 183 169   Cardiac Enzymes: No results for input(s): CKTOTAL, CKMB, CKMBINDEX, TROPONINI in the last 168 hours. BNP (last 3 results) No results for input(s): BNP in the last  8760 hours.  ProBNP (last 3 results) No results for input(s): PROBNP in the last 8760 hours.  CBG: No results for input(s): GLUCAP in the last 168 hours.  No results found for this or any previous visit (from the past 240 hour(s)).   Studies: Pelvis Portable  07/05/2014   CLINICAL DATA:  Postop left femoral fracture fixation.  EXAM: PORTABLE PELVIS 1-2 VIEWS  COMPARISON:  Radiographs 07/04/2014.  FINDINGS: 1751 hours. Interval ORIF of the comminuted intertrochanteric left femur fracture with an intramedullary nail and dynamic compression screw. Hardware appears well positioned. The distal end of intramedullary nail is not visualized. Chronic postsurgical changes in the right proximal femur are stable.  There is no evidence of acute pelvic fracture or dislocation. Right iliac vascular stent, IVC filter and postsurgical changes from hernia repair noted.  IMPRESSION: Interval ORIF of left intertrochanteric femur fracture without demonstrated complication.   Electronically Signed   By: Carey Bullocks M.D.   On: 07/05/2014 18:10   Dg Hip Operative Unilat With Pelvis Left  07/05/2014   CLINICAL DATA:  ORIF left hip  EXAM: OPERATIVE LEFT HIP (WITH PELVIS IF PERFORMED) 5 VIEWS  TECHNIQUE: Fluoroscopic spot image(s) were submitted for interpretation post-operatively.  FLUOROSCOPY TIME:  50 seconds.  Total images: 5  COMPARISON:  None.  FINDINGS: Five intraoperative fluoroscopic spot images demonstrate interval placement of a left femoral intramedullary nail and interlocking interlocking femoral neck screw transfixing an intertrochanteric fracture. There is no dislocation. The alignment is near anatomic.  IMPRESSION: ORIF left intertrochanteric fracture.   Electronically Signed   By: Elige Ko   On: 07/05/2014 17:39    Scheduled Meds: . aspirin EC  81 mg Oral QPM  . bisoprolol  5 mg Oral Daily  . citalopram  20 mg Oral q morning - 10a  . clopidogrel  75 mg Oral q morning - 10a  . diltiazem  180 mg Oral q morning - 10a  . docusate sodium  100 mg Oral BID  . enoxaparin (LOVENOX) injection  40 mg Subcutaneous Q24H  . ferrous sulfate  325 mg Oral TID PC  . isosorbide mononitrate  30 mg Oral q morning - 10a  . lactose free nutrition  237 mL Oral q morning - 10a  . pantoprazole  40 mg Oral q morning - 10a  . [START ON 07/08/2014] predniSONE  10 mg Oral Q breakfast  . simvastatin  10 mg Oral QPM  . tamsulosin  0.4 mg Oral q morning - 10a  . vitamin C  500 mg Oral q morning - 10a  . zinc sulfate  220 mg Oral q morning - 10a   Continuous Infusions: . sodium chloride    . lactated ringers 85 mL/hr at 07/06/14 0040    Active Problems:   PE (pulmonary embolism) July 2012, IVC filter placed 10/03/11   CVA,  Rt brain Feb 2013   Cardiomyopathy, ischemic   Cardiomyopathy, EF 40-50% 2D   Peripheral vascular disease   Essential hypertension, benign   COPD GOLD II   Closed left hip fracture   Hip fracture    CHIU, STEPHEN K  [facility] Pager [phone]. If 7PM-7AM, please contact night-coverage at [email or web address], password [facility] 07/07/2014, 5:12 PM  LOS: 3 days

## 2014-07-07 NOTE — Clinical Social Work Note (Signed)
CSW provided patient with bed offers. Patient discussed offers with wife via telephone. Patient chooses [place] as he states, "It is close to my house." CSW acknowledged patient's right to self-determination and will make facility aware. CSW to continue to follow.  Jahmil Macleod [place], LCSWA Weekend Clinical Social Worker [phone]

## 2014-07-07 NOTE — Evaluation (Signed)
Occupational Therapy Evaluation Patient Details Name: Chrstopher Malenfant MRN: [id number] DOB: 12-08-1933 Today's Date: 07/07/2014    History of Present Illness Pt is a 81 y.o. male s/p Left IM nail for intertrochanter fx on 07/06/14. PMH: CAD/MI, PE, HLD, PVD, COPD, CVA with left sided weakness.    Clinical Impression   PTA pt lived at home with his wife, who had a recent knee surgery. Pt reports that he was ambulating with RW at baseline. Pt is limited by pain in LLE and decreased ROM which impair his independence. Pt will benefit from SNF at d/c due to decreased caregiver support. Pt will also benefit from acute OT to address toilet transfers and LB ADLs.     Follow Up Recommendations  SNF;Supervision/Assistance - 24 hour    Equipment Recommendations  Other (comment) (Defer to SNF)    Recommendations for Other Services       Precautions / Restrictions Precautions Precautions: Fall Precaution Comments: hx of CVA with left sided weakness Restrictions Weight Bearing Restrictions: Yes LLE Weight Bearing: Weight bearing as tolerated      Mobility Bed Mobility Overal bed mobility: Needs Assistance Bed Mobility: Supine to Sit     Supine to sit: Mod assist;HOB elevated     General bed mobility comments: Mod A to manage LEs to EOB and rotate hips with bed pad. Pt able to use UEs on bed rail to pull to sit with assist to bring self forward. Pt with good EOB sitting balance.   Transfers Overall transfer level: Needs assistance Equipment used: Ambulation equipment used Transfers: Sit to/from Stand Sit to Stand: Min assist         General transfer comment: Min A with use of stedy to power up. Pt did surprisingly well with transfer. VC's for hand placement and technique.          ADL Overall ADL's : Needs assistance/impaired Eating/Feeding: Set up;Sitting   Grooming: Set up;Sitting   Upper Body Bathing: Set up;Sitting   Lower Body Bathing: Sit to/from stand;Maximal  assistance (with use of stedy to stand)   Upper Body Dressing : Set up;Sitting   Lower Body Dressing: Sit to/from stand;Total assistance (with use of stedy to stand)   Toilet Transfer: Minimal assistance (with use of stedy) Toilet Transfer Details (indicate cue type and reason): pt performed sit<>stand with use of stedy and required only Min A to stand. Pt with controlled ascent and descent despite pain. Toileting- Clothing Manipulation and Hygiene: Total assistance;Sit to/from stand         General ADL Comments: Pt had difficulty and pain with bed mobility but performed sit<>stand with use of stedy and min A to power up. Pt reported relief sitting up in chair.                Pertinent Vitals/Pain Pain Assessment: 0-10 Pain Score: 8  Pain Location: Lt hip with movement Pain Descriptors / Indicators: Grimacing;Sharp Pain Intervention(s): Limited activity within patient's tolerance;Monitored during session;Repositioned     Hand Dominance Right   Extremity/Trunk Assessment Upper Extremity Assessment Upper Extremity Assessment: LUE deficits/detail LUE Deficits / Details: baseline weakness from CVA   Lower Extremity Assessment Lower Extremity Assessment: Defer to PT evaluation   Cervical / Trunk Assessment Cervical / Trunk Assessment: Normal   Communication Communication Communication: No difficulties   Cognition Arousal/Alertness: Awake/alert Behavior During Therapy: WFL for tasks assessed/performed Overall Cognitive Status: Within Functional Limits for tasks assessed  Home Living Family/patient expects to be discharged to:: Skilled nursing facility Living Arrangements: Spouse/significant other                                      Prior Functioning/Environment Level of Independence: Independent with assistive device(s)        Comments: ambulating with RW    OT Diagnosis: Generalized weakness;Acute  pain   OT Problem List: Decreased strength;Decreased range of motion;Decreased activity tolerance;Impaired balance (sitting and/or standing);Decreased knowledge of use of DME or AE;Decreased knowledge of precautions;Pain   OT Treatment/Interventions: Self-care/ADL training;Therapeutic exercise;Energy conservation;DME and/or AE instruction;Therapeutic activities;Patient/family education;Balance training    OT Goals(Current goals can be found in the care plan section) Acute Rehab OT Goals Patient Stated Goal: to keep moving and feel better OT Goal Formulation: With patient Time For Goal Achievement: 07/21/14 Potential to Achieve Goals: Good ADL Goals Pt Will Perform Lower Body Bathing: with min assist;sit to/from stand Pt Will Perform Lower Body Dressing: with min assist;sit to/from stand Pt Will Transfer to Toilet: with min assist;stand pivot transfer;bedside commode  OT Frequency: Min 1X/week   Barriers to D/C: Decreased caregiver support             End of Session Equipment Utilized During Treatment: Gait belt;Other (comment) (stedy) Nurse Communication: Other (comment) (use of stedy for transfers. )  Activity Tolerance: Patient tolerated treatment well Patient left: in chair;with call bell/phone within reach   Time: 2297-9892 OT Time Calculation (min): 30 min Charges:  OT General Charges $OT Visit: 1 Procedure OT Evaluation $Initial OT Evaluation Tier I: 1 Procedure OT Treatments $Self Care/Home Management : 8-22 mins G-Codes:    Rae Lips 2014/07/15, 10:49 AM  Carney Living, OTR/L Occupational Therapist [phone] (pager)

## 2014-07-08 LAB — COMPREHENSIVE METABOLIC PANEL
ALBUMIN: 2.4 g/dL — AB (ref 3.5–5.2)
ALK PHOS: 127 U/L — AB (ref 39–117)
ALT: 24 U/L (ref 0–53)
AST: 50 U/L — AB (ref 0–37)
Anion gap: 4 — ABNORMAL LOW (ref 5–15)
BUN: 16 mg/dL (ref 6–23)
CO2: 24 mmol/L (ref 19–32)
Calcium: 7.9 mg/dL — ABNORMAL LOW (ref 8.4–10.5)
Chloride: 111 mmol/L (ref 96–112)
Creatinine, Ser: 1.24 mg/dL (ref 0.50–1.35)
GFR calc non Af Amer: 54 mL/min — ABNORMAL LOW (ref >90)
GFR, EST AFRICAN AMERICAN: 62 mL/min — AB (ref >90)
Glucose, Bld: 117 mg/dL — ABNORMAL HIGH (ref 70–99)
Potassium: 4.1 mmol/L (ref 3.5–5.1)
Sodium: 139 mmol/L (ref 135–145)
TOTAL PROTEIN: 5.8 g/dL — AB (ref 6.0–8.3)
Total Bilirubin: 0.8 mg/dL (ref 0.3–1.2)

## 2014-07-08 LAB — CBC
HCT: 27.3 % — ABNORMAL LOW (ref 39.0–52.0)
Hemoglobin: 8.6 g/dL — ABNORMAL LOW (ref 13.0–17.0)
MCH: 27 pg (ref 26.0–34.0)
MCHC: 31.5 g/dL (ref 30.0–36.0)
MCV: 85.8 fL (ref 78.0–100.0)
Platelets: 160 10*3/uL (ref 150–400)
RBC: 3.18 MIL/uL — ABNORMAL LOW (ref 4.22–5.81)
RDW: 15.5 % (ref 11.5–15.5)
WBC: 6.3 10*3/uL (ref 4.0–10.5)

## 2014-07-08 LAB — HEMOGLOBIN AND HEMATOCRIT, BLOOD
HEMATOCRIT: 26.9 % — AB (ref 39.0–52.0)
HEMOGLOBIN: 8.7 g/dL — AB (ref 13.0–17.0)

## 2014-07-08 MED ORDER — SORBITOL 70 % SOLN
30.0000 mL | Status: AC
  Administered 2014-07-08: 30 mL via ORAL
  Filled 2014-07-08: qty 30

## 2014-07-08 NOTE — Progress Notes (Signed)
Subjective: 3 Days Post-Op Procedure(s) (LRB): INTRAMEDULLARY (IM) NAIL INTERTROCHANTRIC (Left) Patient reports pain as soreness to left hip.  Tolerating Po's, no N/V. Progressing with PT. Denies CP, SOB, or calf pain.    Objective: Vital signs in last 24 hours: Temp:  [97.6 F (36.4 C)-98.8 F (37.1 C)] 98.2 F (36.8 C) (03/06 0453) Pulse Rate:  [82-115] 104 (03/06 0453) Resp:  [18-20] 18 (03/06 0453) BP: (129-153)/(69-77) 148/71 mmHg (03/06 0453) SpO2:  [90 %-96 %] 96 % (03/06 0453)  Intake/Output from previous day: 03/05 0701 - 03/06 0700 In: 1947.5 [P.O.:825; I.V.:1122.5] Out: 650 [Urine:650] Intake/Output this shift:     Recent Labs  07/06/14 0555 07/07/14 0415 07/08/14 0510  HGB 9.7* 10.2* 8.6*    Recent Labs  07/07/14 0415 07/08/14 0510  WBC 6.8 6.3  RBC 3.82* 3.18*  HCT 32.8* 27.3*  PLT 169 160    Recent Labs  07/07/14 0415 07/08/14 0510  NA 139 139  K 4.3 4.1  CL 109 111  CO2 25 24  BUN 24* 16  CREATININE 1.39* 1.24  GLUCOSE 105* 117*  CALCIUM 8.3* 7.9*   No results for input(s): LABPT, INR in the last 72 hours.  Alert and oriented x3. RRR, Lungs clear, BS x4. Left Calf soft and non tender. L hip dressing C/D/I. No DVT signs. No signs of infection or compartment syndrome. LLE neurovascularly intact.   Assessment/Plan: 3 Days Post-Op Procedure(s) (LRB): INTRAMEDULLARY (IM) NAIL INTERTROCHANTRIC (Left) Up with PT WBAT LLE Continue current care Plan D/c when ready Medicine following  [name] L 07/08/2014, 7:31 AM

## 2014-07-08 NOTE — Progress Notes (Signed)
Gave Sorbitol per MD order at 1216. No bowel movement and pt states "does not feel like I have to go". Gave pt some warm prune/apple juice. Will continue to monitor.

## 2014-07-08 NOTE — Progress Notes (Signed)
[facility] PROGRESS NOTE  [name] GNF:[id number] DOB: 1934-05-02 DOA: 07/04/2014 PCP: Katy Apo, MD  Assessment/Plan: 1. L intertrochanteric hip fracture 1. Pt has been seen by Orthopedic surgery and is s/p surgery on 3/3 2. Pt has been seen by PT/OT with recs for SNF 3. Stable thus far 4. Continue with analgesics as tolerated 5. Pending placement 2. CAD with hx NSTEMI 1. Presently asymptomatic 2. Cont asa and plavix 3. Cont imdur 4. Cont beta blocker per home regimen 3. PVD with thrombosed R-L BPG, s/p thrombectomy in 6/13 1. Remains asymptomatic 2. Continued on ASA and plavix 4. Hx PE s/p IVC filter placement 6/13 1. Pt no longer on full anticoagulation secondary to hx of GI bleed 2. On min O2 support 5. Hx CVA 1. Stable 2. Pt with residual L sided hemiparesis 3. On ASA and plavix 6. COPD 1. Remains stable 2. No wheezing on exam 3. Pt remains on daily home prednisone 7. HTN 1. BP remains stable 2. Cont current regimen of beta blocker, cardizem 3. Monitor 8. DVT prophylaxis 1. Heparin subQ 9. Abd distension secondary to constipation 1. Pos bowel sounds on exam 2. Pt does have notable umbilical hernia on exam, not incarcerated 3. Abd distended 4. Abd xray with moderate stool, no obstruction 5. Pt reports no bm this past week 6. Have started miralax. Will add sorbitol 10. Anemia 1. Normocytic 2. Suspect post-operative anemia 3. Will repeat h/h at 2pm this afternoon  Code Status: Full Family Communication: Pt in room  Disposition Plan: Pending SNF placement  Consultants:  Orthopedic Surgery  Procedures:    Antibiotics:    HPI/Subjective: Complains of continued increased abd girth. No BM overnight  Objective: Filed Vitals:   07/07/14 2026 07/08/14 0000 07/08/14 0400 07/08/14 0453  BP: 129/69   148/71  Pulse: 115   104  Temp: 98.8 F (37.1 C)   98.2 F (36.8 C)  TempSrc: Oral   Oral  Resp: Height:      Weight:       SpO2: 90% 92% 96% 96%    Intake/Output Summary (Last 24 hours) at 07/08/14 1213 Last data filed at 07/08/14 0900  Gross per 24 hour  Intake  827.5 ml  Output    850 ml  Net  -22.5 ml   Filed Weights   07/04/14 2052  Weight: 65.3 kg (143 lb 15.4 oz)    Exam:   General:  Awake, sitting in chair, in nad  Cardiovascular: regular, s1, s2  Respiratory: normal resp effort, no wheezing  Abdomen: distended, pos BS, nontender, umbilical hernia that is not incarcerated  Musculoskeletal: perfused,no clubbing, post-op dressings in place  Data Reviewed: Basic Metabolic Panel:  Recent Labs Lab 07/04/14 1003 07/06/14 0555 07/07/14 0415 07/08/14 0510  NA 140 138 139 139  K 4.0 4.1 4.3 4.1  CL 109 103 109 111  CO2 GLUCOSE 149* 112* 105* 117*  BUN 23 29* 24* 16  CREATININE 1.36* 1.58* 1.39* 1.24  CALCIUM 9.0 8.3* 8.3* 7.9*   Liver Function Tests:  Recent Labs Lab 07/08/14 0510  AST 50*  ALT 24  ALKPHOS 127*  BILITOT 0.8  PROT 5.8*  ALBUMIN 2.4*   No results for input(s): LIPASE, AMYLASE in the last 168 hours. No results for input(s): AMMONIA in the last 168 hours. CBC:  Recent Labs Lab 07/04/14 1003 07/06/14 0555 07/07/14 0415 07/08/14 0510  WBC 8.1 6.5 6.8 6.3  NEUTROABS 7.0  --   --   --  HGB 12.0* 9.7* 10.2* 8.6*  HCT 37.8* 31.2* 32.8* 27.3*  MCV 85.3 85.7 85.9 85.8  PLT 196 183 169 160   Cardiac Enzymes: No results for input(s): CKTOTAL, CKMB, CKMBINDEX, TROPONINI in the last 168 hours. BNP (last 3 results) No results for input(s): BNP in the last 8760 hours.  ProBNP (last 3 results) No results for input(s): PROBNP in the last 8760 hours.  CBG: No results for input(s): GLUCAP in the last 168 hours.  No results found for this or any previous visit (from the past 240 hour(s)).   Studies: Dg Abd Portable 1v  07/07/2014   CLINICAL DATA:  Initial evaluation of constipation  EXAM: PORTABLE ABDOMEN - 1 VIEW  COMPARISON:  07/05/2014   FINDINGS: Nonobstructive bowel gas pattern. Moderate stool throughout the colon. Rectum not included on the images. Significant scoliosis stable. Iliac stent an inferior vena cava filter noted again.  IMPRESSION: Moderate fecal retention.   Electronically Signed   By: Esperanza Heir M.D.   On: 07/07/2014 20:05    Scheduled Meds: . aspirin EC  81 mg Oral QPM  . bisoprolol  5 mg Oral Daily  . citalopram  20 mg Oral q morning - 10a  . clopidogrel  75 mg Oral q morning - 10a  . diltiazem  180 mg Oral q morning - 10a  . docusate sodium  100 mg Oral BID  . enoxaparin (LOVENOX) injection  40 mg Subcutaneous Q24H  . ferrous sulfate  325 mg Oral TID PC  . isosorbide mononitrate  30 mg Oral q morning - 10a  . lactose free nutrition  237 mL Oral q morning - 10a  . pantoprazole  40 mg Oral q morning - 10a  . polyethylene glycol  17 g Oral Daily  . predniSONE  10 mg Oral Q breakfast  . simvastatin  10 mg Oral QPM  . sorbitol  30 mL Oral NOW  . tamsulosin  0.4 mg Oral q morning - 10a  . vitamin C  500 mg Oral q morning - 10a  . zinc sulfate  220 mg Oral q morning - 10a   Continuous Infusions: . sodium chloride 1 mL (07/08/14 0302)  . 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 If 7PM-7AM, please contact night-coverage at [email or web address], password [facility] 07/08/2014, 12:13 PM  LOS: 4 days

## 2014-07-09 LAB — BASIC METABOLIC PANEL
Anion gap: 3 — ABNORMAL LOW (ref 5–15)
BUN: 12 mg/dL (ref 6–23)
CALCIUM: 7.9 mg/dL — AB (ref 8.4–10.5)
CO2: 23 mmol/L (ref 19–32)
Chloride: 113 mmol/L — ABNORMAL HIGH (ref 96–112)
Creatinine, Ser: 1.07 mg/dL (ref 0.50–1.35)
GFR, EST AFRICAN AMERICAN: 75 mL/min — AB (ref >90)
GFR, EST NON AFRICAN AMERICAN: 64 mL/min — AB (ref >90)
GLUCOSE: 110 mg/dL — AB (ref 70–99)
Potassium: 3.7 mmol/L (ref 3.5–5.1)
Sodium: 139 mmol/L (ref 135–145)

## 2014-07-09 LAB — CBC
HCT: 26.1 % — ABNORMAL LOW (ref 39.0–52.0)
HEMOGLOBIN: 8.3 g/dL — AB (ref 13.0–17.0)
MCH: 26.9 pg (ref 26.0–34.0)
MCHC: 31.8 g/dL (ref 30.0–36.0)
MCV: 84.5 fL (ref 78.0–100.0)
Platelets: 171 10*3/uL (ref 150–400)
RBC: 3.09 MIL/uL — AB (ref 4.22–5.81)
RDW: 15.6 % — AB (ref 11.5–15.5)
WBC: 6.2 10*3/uL (ref 4.0–10.5)

## 2014-07-09 MED ORDER — SORBITOL 70 % SOLN
960.0000 mL | TOPICAL_OIL | ORAL | Status: DC
  Filled 2014-07-09: qty 240

## 2014-07-09 MED ORDER — POLYETHYLENE GLYCOL 3350 17 G PO PACK: 17.0000 g | PACK | Freq: Every day | ORAL | Status: AC

## 2014-07-09 MED ORDER — LINACLOTIDE 145 MCG PO CAPS
145.0000 ug | ORAL_CAPSULE | Freq: Every day | ORAL | Status: DC
  Administered 2014-07-09: 145 ug via ORAL
  Filled 2014-07-09: qty 1

## 2014-07-09 MED ORDER — LACTULOSE 10 GM/15ML PO SOLN
30.0000 g | ORAL | Status: AC
  Administered 2014-07-09: 30 g via ORAL
  Filled 2014-07-09: qty 45

## 2014-07-09 MED ORDER — HYDROCODONE-ACETAMINOPHEN 5-325 MG PO TABS: 1.0000 | ORAL_TABLET | Freq: Four times a day (QID) | ORAL | Status: AC | PRN

## 2014-07-09 NOTE — Discharge Summary (Addendum)
Physician Discharge Summary  [name] ZOX:[id number] DOB: 08-27-1933 DOA: 07/04/2014  PCP: Katy Apo, MD  Admit date: 07/04/2014 Discharge date: 07/09/2014  Time spent: 25 minutes  Recommendations for Outpatient Follow-up:  1. Follow up with PCP in 1-2 weeks 2. 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 Briefly, pt presented s/p mechanical fall resulting in L hip fracture. The patient was admitted for further work up.  Hospital Course:  1. L intertrochanteric hip fracture 1. Pt has been seen by Orthopedic surgery and is s/p surgery on 3/3 2. Pt has been seen by PT/OT with recs for SNF 3. Remained stable 4. Continue with analgesics as tolerated 2. CAD with hx NSTEMI 1. Remained asymptomatic 2. Cont asa and plavix 3. Cont imdur 4. Cont beta blocker per home regimen 3. PVD with thrombosed R-L BPG, s/p thrombectomy in 6/13 1. Remains asymptomatic 2. Continued on ASA and plavix 4. Hx PE s/p IVC filter placement 6/13 1. Pt no longer on full anticoagulation secondary to hx of GI bleed 2. On min O2 support 5. Hx CVA 1. Stable 2. Pt with residual L sided hemiparesis 3. On ASA and plavix 6. COPD 1. Remains stable 2. No wheezing on exam 3. Pt remains on daily home prednisone 7. HTN 1. BP remains stable 2. Cont current regimen of beta blocker, cardizem 3. Monitor 8. DVT prophylaxis 1. Heparin subQ 9. Abd distension secondary to constipation 1. Pos bowel  sounds on exam 2. Pt does have notable umbilical hernia on exam, not incarcerated 3. Abd was distended 4. Abd xray with moderate stool, no obstruction 5. Pt reported no bowel movement in past 7 days 6. Given multiple cathartics with eventual resolution with large bowel movement on 3/7 7. Advise daily miralax, especially if patient is on pain meds and is less mobile 10. Anemia 1. Normocytic 2. Suspect post-operative anemia 3. Hgb remained stable 11. DVT prophylaxis 1. Discussed with Orthopedic Surgery with recs to continue plavix and aspirin for prophylaxis  Procedures:  Open reduction and internal fixation w/ intertrochanteric nail of L hip 3/3  Consultations:  Orthopedic surgery  Discharge Exam: Filed Vitals:   07/09/14 0558 07/09/14 0953 07/09/14 1200 07/09/14 1334  BP: 171/73 141/81  131/61  Pulse: 104 105  80  Temp: 99 F (37.2 C)   97.4 F (36.3 C)  TempSrc: Oral   Oral  Resp: Height:      Weight:      SpO2: 94%   96%    General: awake, in nad Cardiovascular: regular, s1, s2 Respiratory: normal resp effort, no wheezing  Discharge Instructions     Medication List    TAKE these medications        Albuterol Sulfate 108 (90 BASE) MCG/ACT Aepb  Commonly known as:  PROAIR RESPICLICK  Inhale 1-2 puffs into the lungs every 4 (four) hours as needed.     ALPRAZolam 0.25 MG  tablet  Commonly known as:  XANAX  Take 0.25 mg by mouth daily as needed for anxiety.     aspirin EC 81 MG tablet  Take 81 mg by mouth every evening.     bisoprolol 5 MG tablet  Commonly known as:  ZEBETA  Take 1 tablet (5 mg total) by mouth daily.     citalopram 20 MG tablet  Commonly known as:  CELEXA  Take 20 mg by mouth every morning.     clopidogrel 75 MG tablet  Commonly known as:  PLAVIX  Take 75 mg by mouth every morning.     diclofenac sodium 1 % Gel  Commonly known as:  VOLTAREN  Apply 2 g topically 4 (four) times daily.     diltiazem 180 MG 24 hr capsule   Commonly known as:  DILACOR XR  Take 180 mg by mouth every morning.     FLOMAX 0.4 MG Caps capsule  Generic drug:  tamsulosin  Take 0.4 mg by mouth every morning.     Fluticasone Furoate-Vilanterol 100-25 MCG/INH Aepb  Commonly known as:  BREO ELLIPTA  Inhale 1 puff into the lungs every morning.     HYDROcodone-acetaminophen 5-325 MG per tablet  Commonly known as:  NORCO/VICODIN  Take 1-2 tablets by mouth every 6 (six) hours as needed for moderate pain.     isosorbide mononitrate 30 MG 24 hr tablet  Commonly known as:  IMDUR  Take 30 mg by mouth every morning.     lactose free nutrition Liqd  Take 237 mLs by mouth every morning. Chocolate.     multivitamin with minerals Tabs tablet  Take 1 tablet by mouth every morning.     nitroGLYCERIN 0.4 MG SL tablet  Commonly known as:  NITROSTAT  Place 0.4 mg under the tongue every 5 (five) minutes as needed for chest pain.     pantoprazole 40 MG tablet  Commonly known as:  PROTONIX  Take 40 mg by mouth every morning.     polyethylene glycol packet  Commonly known as:  MIRALAX / GLYCOLAX  Take 17 g by mouth daily.     predniSONE 10 MG tablet  Commonly known as:  DELTASONE  Take 10 mg by mouth daily with breakfast.     simvastatin 10 MG tablet  Commonly known as:  ZOCOR  Take 10 mg by mouth every evening.     vitamin C 500 MG tablet  Commonly known as:  ASCORBIC ACID  Take 500 mg by mouth every morning.     zinc sulfate 220 MG capsule  Take 220 mg by mouth every morning.      ASK your doctor about these medications        nebivolol 10 MG tablet  Commonly known as:  BYSTOLIC  Take 10 mg by mouth daily.       No Known Allergies Follow-up Information    Follow up with POLITE,RONALD D, MD. Schedule an appointment as soon as possible for a visit in 1 week.   Specialty:  Internal Medicine   Contact information:   [address] [place] [address] [phone]        The results of  significant diagnostics from this hospitalization (including imaging, microbiology, ancillary and laboratory) are listed below for reference.    Significant Diagnostic Studies: Dg Chest 1 View  07/04/2014   CLINICAL DATA:  Known left hip fracture  EXAM: CHEST  1 VIEW  COMPARISON:  09/12/2013  FINDINGS:  Cardiac shadow is stable. The lungs are well aerated bilaterally. Focal area of increased density is noted in the right upper lobe which may represent some early atelectasis. Mild vascular prominence is seen. Coronary stenting is noted.  IMPRESSION: Mild right upper lobe atelectasis  Mild vascular prominence.   Electronically Signed   By: Alcide Clever M.D.   On: 07/04/2014 10:29   Pelvis Portable  07/05/2014   CLINICAL DATA:  Postop left femoral fracture fixation.  EXAM: PORTABLE PELVIS 1-2 VIEWS  COMPARISON:  Radiographs 07/04/2014.  FINDINGS: 1751 hours. Interval ORIF of the comminuted intertrochanteric left femur fracture with an intramedullary nail and dynamic compression screw. Hardware appears well positioned. The distal end of intramedullary nail is not visualized. Chronic postsurgical changes in the right proximal femur are stable. There is no evidence of acute pelvic fracture or dislocation. Right iliac vascular stent, IVC filter and postsurgical changes from hernia repair noted.  IMPRESSION: Interval ORIF of left intertrochanteric femur fracture without demonstrated complication.   Electronically Signed   By: Carey Bullocks M.D.   On: 07/05/2014 18:10   Dg Abd Portable 1v  07/07/2014   CLINICAL DATA:  Initial evaluation of constipation  EXAM: PORTABLE ABDOMEN - 1 VIEW  COMPARISON:  07/05/2014  FINDINGS: Nonobstructive bowel gas pattern. Moderate stool throughout the colon. Rectum not included on the images. Significant scoliosis stable. Iliac stent an inferior vena cava filter noted again.  IMPRESSION: Moderate fecal retention.   Electronically Signed   By: Esperanza Heir M.D.   On: 07/07/2014 20:05    Dg Hip Operative Unilat With Pelvis Left  07/05/2014   CLINICAL DATA:  ORIF left hip  EXAM: OPERATIVE LEFT HIP (WITH PELVIS IF PERFORMED) 5 VIEWS  TECHNIQUE: Fluoroscopic spot image(s) were submitted for interpretation post-operatively.  FLUOROSCOPY TIME:  50 seconds.  Total images: 5  COMPARISON:  None.  FINDINGS: Five intraoperative fluoroscopic spot images demonstrate interval placement of a left femoral intramedullary nail and interlocking interlocking femoral neck screw transfixing an intertrochanteric fracture. There is no dislocation. The alignment is near anatomic.  IMPRESSION: ORIF left intertrochanteric fracture.   Electronically Signed   By: Elige Ko   On: 07/05/2014 17:39   Dg Hip Unilat With Pelvis 2-3 Views Left  07/04/2014   CLINICAL DATA:  Fall last evening with persistent left hip pain, initial encounter  EXAM: LEFT HIP (WITH PELVIS) 2-3 VIEWS  COMPARISON:  None.  FINDINGS: Postsurgical changes are noted in the proximal right femur. The pelvic ring is intact. Postsurgical changes are noted within the abdomen as well as within the iliac artery on the right.  A comminuted intratrochanteric fracture with impaction and angulation at the fracture site is noted. No gross soft tissue abnormality is seen.  IMPRESSION: Left intertrochanteric fracture   Electronically Signed   By: Alcide Clever M.D.   On: 07/04/2014 10:28    Microbiology: No results found for this or any previous visit (from the past 240 hour(s)).   Labs: Basic Metabolic Panel:  Recent Labs Lab 07/04/14 1003 07/06/14 0555 07/07/14 0415 07/08/14 0510 07/09/14 0622  NA 140 138 139 139 139  K 4.0 4.1 4.3 4.1 3.7  CL 109 103 109 111 113*  CO2 GLUCOSE 149* 112* 105* 117* 110*  BUN 23 29* 24* 16 12  CREATININE 1.36* 1.58* 1.39* 1.24 1.07  CALCIUM 9.0 8.3* 8.3* 7.9* 7.9*   Liver Function Tests:  Recent Labs Lab 07/08/14 0510  AST 50*  ALT  24  ALKPHOS 127*  BILITOT 0.8  PROT 5.8*  ALBUMIN  2.4*   No results for input(s): LIPASE, AMYLASE in the last 168 hours. No results for input(s): AMMONIA in the last 168 hours. CBC:  Recent Labs Lab 07/04/14 1003 07/06/14 0555 07/07/14 0415 07/08/14 0510 07/08/14 1258 07/09/14 0622  WBC 8.1 6.5 6.8 6.3  --  6.2  NEUTROABS 7.0  --   --   --   --   --   HGB 12.0* 9.7* 10.2* 8.6* 8.7* 8.3*  HCT 37.8* 31.2* 32.8* 27.3* 26.9* 26.1*  MCV 85.3 85.7 85.9 85.8  --  84.5  PLT 196 183 169 160  --  171   Cardiac Enzymes: No results for input(s): CKTOTAL, CKMB, CKMBINDEX, TROPONINI in the last 168 hours. BNP: BNP (last 3 results) No results for input(s): BNP in the last 8760 hours.  ProBNP (last 3 results) No results for input(s): PROBNP in the last 8760 hours.  CBG: No results for input(s): GLUCAP in the last 168 hours.  Signed:  CHIU, STEPHEN K  [facility] 07/09/2014, 1:37 PM

## 2014-07-09 NOTE — Clinical Social Work Placement (Signed)
Clinical Social Work Department CLINICAL SOCIAL WORK PLACEMENT NOTE 07/05/2014  Patient:  [name], [name]  Account Number:  [id number] Admit date:  07/04/2014  Clinical Social Worker:  Stacy Gardner, CLINICAL SOCIAL WORKER  Date/time:  07/05/2014 03:06 PM  Clinical Social Work is seeking post-discharge placement for this patient at the following level of care:   SKILLED NURSING   (*CSW will update this form in Epic as items are completed)   07/05/2014  Patient/family provided with [facility] Department of Clinical Social Work's list of facilities offering this level of care within the geographic area requested by the patient (or if unable, by the patient's family).  07/05/2014  Patient/family informed of their freedom to choose among providers that offer the needed level of care, that participate in Medicare, Medicaid or managed care program needed by the patient, have an available bed and are willing to accept the patient.  07/05/2014  Patient/family informed of [facility]' ownership interest in [facility], as well as of the fact that they are under no obligation to receive care at this facility.  PASARR submitted to EDS on 07/05/2014 PASARR number received on 07/05/2014  FL2 transmitted to all facilities in geographic area requested by pt/family on  07/05/2014 FL2 transmitted to all facilities within larger geographic area on   Patient informed that his/her managed care company has contracts with or will negotiate with  certain facilities, including the following:     Patient/family informed of bed offers received:  Completed by weekend CSW. Patient chooses bed at [facility] Marcelline Deist, [place]) Physician recommends and patient chooses bed at    Patient to be transferred to  [facility] on 07/09/2014 Marcelline Deist, [facility])  Patient to be transferred to facility by [facility] Marcelline Deist, Theresia Majors) Patient and family notified of transfer on 07/09/2014 Marcelline Deist,  Theresia Majors) Name of family member notified:  Patient updated at bedside. Patient on phone with patient's wife while CSW present in room. Lily Kocher)  The following physician request were entered in Epic:   Additional Comments:   Stacy Gardner, BSW Intern, [phone]  Marcelline Deist, LCSWA [phone]) Licensed Clinical Social Worker Orthopedics [phone]) and Surgical [phone])

## 2014-07-09 NOTE — Progress Notes (Signed)
Physical Therapy Treatment Patient Details Name: [name] MRN: [id number] DOB: 1933-11-29 Today's Date: 07/09/2014    History of Present Illness Pt is a 81 y.o. male s/p Left IM nail for intertrochanter fx on 07/06/14. PMH: CAD/MI, PE, HLD, PVD, COPD, CVA with left sided weakness.     PT Comments    Pt demonstrates significant BUE and BLE weakness limiting his ability to participate in transfers and ambulatory activity.  Pt requires either use of steady or +2 physical assistance for transfers; max A x1 required this visit with stand pivot transfer.   Follow Up Recommendations  SNF;Supervision/Assistance - 24 hour     Equipment Recommendations  None recommended by PT    Recommendations for Other Services       Precautions / Restrictions Precautions Precautions: Fall Precaution Comments: hx of CVA with left sided weakness Restrictions Weight Bearing Restrictions: Yes LLE Weight Bearing: Weight bearing as tolerated    Mobility  Bed Mobility Overal bed mobility: Needs Assistance Bed Mobility: Supine to Sit     Supine to sit: Mod assist;HOB elevated (assist w/ pushing up to sitting and moving LLE OOB)     General bed mobility comments: Mod A to bring LLE OOB, supporting trunk, and rotate using bed pad.  Pt's L sided weakness limits pt's ability to use railings.  Verbal cues for hand placement.  Transfers Overall transfer level: Needs assistance Equipment used: None Transfers: Stand Pivot Transfers (from bed to [facility] and [facility] to chair)   Stand pivot transfers: Max assist (w/ gait belt)       General transfer comment: Max A overall for tranfer, including assistance to stand upright during transfer  Ambulation/Gait             General Gait Details: did not attempt 2/2 pt's sig BLE and BUE weakness   Stairs            Wheelchair Mobility    Modified Rankin (Stroke Patients Only)       Balance Overall balance assessment: Needs  assistance Sitting-balance support: Feet supported;Bilateral upper extremity supported Sitting balance-Leahy Scale: Poor Sitting balance - Comments: leaning to R and posteriorly Postural control: Posterior lean;Right lateral lean Standing balance support: Bilateral upper extremity supported;During functional activity (during stand pivot transfer; max A) Standing balance-Leahy Scale: Zero                      Cognition Arousal/Alertness: Awake/alert Behavior During Therapy: WFL for tasks assessed/performed Overall Cognitive Status: Within Functional Limits for tasks assessed                      Exercises Total Joint Exercises Ankle Circles/Pumps: AROM;20 reps;Both;Supine Heel Slides: 10 reps;AAROM;Left;Supine Hip ABduction/ADduction: AAROM;10 reps;Supine;Left Straight Leg Raises: AAROM;Left;10 reps;Supine    General Comments General comments (skin integrity, edema, etc.): Pt will benefit from the use of the steady for transfers      Pertinent Vitals/Pain Pain Assessment: 0-10 Pain Score: 8  Pain Location: L hip Pain Descriptors / Indicators: Aching;Discomfort;Grimacing;Guarding Pain Intervention(s): Limited activity within patient's tolerance;Monitored during session;Repositioned    Home Living                      Prior Function            PT Goals (current goals can now be found in the care plan section) Acute Rehab PT Goals Patient Stated Goal: to be able to move around with the least amount  of help Progress towards PT goals: Progressing toward goals    Frequency  Min 3X/week    PT Plan Current plan remains appropriate    Co-evaluation             End of Session Equipment Utilized During Treatment: Gait belt Activity Tolerance: Patient limited by pain;Patient limited by fatigue (L sided weakness) Patient left: in chair;with call bell/phone within reach;with family/visitor present (son in room)     Time: 0272-5366 PT Time  Calculation (min) (ACUTE ONLY): 42 min  Charges:  $Therapeutic Exercise: 8-22 mins $Therapeutic Activity: 23-37 mins                    G CodesMichail Jewels PT, [place] [phone] #[phone] 07/09/2014, 12:48 PM

## 2014-07-09 NOTE — Progress Notes (Signed)
Physical Therapy Treatment Patient Details Name: [name] MRN: [id number] DOB: 1933/05/06 Today's Date: 07/09/2014    History of Present Illness Pt is a 81 y.o. male s/p Left IM nail for intertrochanter fx on 07/06/14. PMH: CAD/MI, PE, HLD, PVD, COPD, CVA with left sided weakness.     PT Comments    Pt required max A x2 for transfer to [facility] from chair, BSC to bed, and bed mobility scooting HOB.  The steady was used for transfers during this session with help from RN.  Pt is anticipating d/c to SNF today.  Follow Up Recommendations  SNF;Supervision/Assistance - 24 hour     Equipment Recommendations  None recommended by PT    Recommendations for Other Services       Precautions / Restrictions Precautions Precautions: Fall Precaution Comments: hx of CVA with left sided weakness Restrictions Weight Bearing Restrictions: Yes LLE Weight Bearing: Weight bearing as tolerated    Mobility  Bed Mobility Overal bed mobility: +2 for physical assistance (+2 for scooting HOB) Bed Mobility: Sit to Supine       Sit to supine: Min assist (w/ moving BLEs onto bed)   General bed mobility comments:  (+2 for scooting HOB using bed pad)  Transfers Overall transfer level: Needs assistance Equipment used:  (steady) Transfers: Sit to/from Stand Sit to Stand: +2 safety/equipment;Max assist (using steady)         General transfer comment: Max A overall for transfer, including assistance to stand upright during transfer  Ambulation/Gait                 Stairs            Wheelchair Mobility    Modified Rankin (Stroke Patients Only)       Balance Overall balance assessment: Needs assistance Sitting-balance support: Feet supported;Bilateral upper extremity supported Sitting balance-Leahy Scale: Poor Sitting balance - Comments: leaning to R and posteriorly Postural control: Posterior lean;Right lateral lean Standing balance support: Bilateral upper extremity  supported Standing balance-Leahy Scale: Zero Standing balance comment: use of steady and assistance standing upright required                    Cognition Arousal/Alertness: Awake/alert Behavior During Therapy: WFL for tasks assessed/performed Overall Cognitive Status: Within Functional Limits for tasks assessed                      Exercises      General Comments        Pertinent Vitals/Pain Pain Assessment: 0-10 Pain Score: 7  Pain Location: L hip Pain Descriptors / Indicators: Aching;Discomfort;Grimacing;Guarding;Moaning Pain Intervention(s): Limited activity within patient's tolerance;Monitored during session;Repositioned    Home Living                      Prior Function            PT Goals (current goals can now be found in the care plan section) Progress towards PT goals: Progressing toward goals    Frequency  Min 3X/week    PT Plan Current plan remains appropriate    Co-evaluation             End of Session   Activity Tolerance: Patient limited by pain;Patient limited by fatigue Patient left: in bed;with call bell/phone within reach     Time: 1250-1330 (10 minutes for toileting) PT Time Calculation (min) (ACUTE ONLY): 40 min  Charges:  $Therapeutic Activity: 23-37 mins  G CodesMichail Jewels PT, [place] [phone] 07/09/2014, 2:00 PM

## 2014-07-09 NOTE — Discharge Planning (Signed)
Patient to be discharged to [facility]. Patient updated at bedside regarding discharge.  [facility] Medicare SNF authorization: [id number]  Facility: [facility] Report number: [phone] Transportation: EMS ([address]  Marcelline Deist, [place] [phone]) Licensed Clinical Social Worker Orthopedics [phone]) and Surgical [phone])

## 2014-07-09 NOTE — Progress Notes (Signed)
CARE MANAGEMENT NOTE 07/09/2014  Patient:  [name], [name]   Account Number:  [id number]  Date Initiated:  07/06/2014  Documentation initiated by:  Vance Peper  Subjective/Objective Assessment:   81 yr old male admitted with left intertrochanter fracture.patient under went left hip IM Nailing. Patient has been at [facility] and will not return there.     Action/Plan:   CM spoke with patient concerning discharge needs. Social worker notified of need for SNF. Patient has family support at discharge.  [facility] would be choice for HH. Has RW and W/C.   Anticipated DC Date:  07/09/2014   Anticipated DC Plan:  SKILLED NURSING FACILITY  In-house referral  Clinical Social Worker      DC Planning Services  CM consult      [facility] Choice  NA   Choice offered to / List presented to:     DME arranged  NA        HH arranged  NA      Status of service:  Completed, signed off Medicare Important Message given?  YES Date Medicare IM given:  07/09/2014 Medicare IM given by:  [facility]  Discharge Disposition:  SKILLED NURSING FACILITY  Per UR Regulation:  Reviewed for med. necessity/level of care/duration of stay

## 2014-07-09 NOTE — Progress Notes (Signed)
Report called to [place] RN at [facility]. Pt resting in bed with no complaints

## 2014-07-09 NOTE — Progress Notes (Signed)
    Subjective: 4 Days Post-Op Procedure(s) (LRB): INTRAMEDULLARY (IM) NAIL INTERTROCHANTRIC (Left) Patient reports pain as 2 on 0-10 scale.   Denies CP or SOB.  Voiding without difficulty. Positive flatus. Objective: Vital signs in last 24 hours: Temp:  [97.4 F (36.3 C)-99.2 F (37.3 C)] 97.4 F (36.3 C) (03/07 1334) Pulse Rate:  [80-105] 80 (03/07 1334) Resp:  [16-18] 18 (03/07 1334) BP: (129-171)/(61-81) 131/61 mmHg (03/07 1334) SpO2:  [94 %-96 %] 96 % (03/07 1334)  Intake/Output from previous day: 03/06 0701 - 03/07 0700 In: 580 [P.O.:360; I.V.:220] Out: 300 [Urine:300] Intake/Output this shift: Total I/O In: 240 [P.O.:240] Out: 400 [Urine:400]  Labs:  Recent Labs  07/07/14 0415 07/08/14 0510 07/08/14 1258 07/09/14 0622  HGB 10.2* 8.6* 8.7* 8.3*    Recent Labs  07/08/14 0510 07/08/14 1258 07/09/14 0622  WBC 6.3  --  6.2  RBC 3.18*  --  3.09*  HCT 27.3* 26.9* 26.1*  PLT 160  --  171    Recent Labs  07/08/14 0510 07/09/14 0622  NA 139 139  K 4.1 3.7  CL 111 113*  CO2 24 23  BUN 16 12  CREATININE 1.24 1.07  GLUCOSE 117* 110*  CALCIUM 7.9* 7.9*   No results for input(s): LABPT, INR in the last 72 hours.  Physical Exam: Neurologically intact ABD soft Intact pulses distally Incision: dressing C/D/I and no drainage Compartment soft  Assessment/Plan: 4 Days Post-Op Procedure(s) (LRB): INTRAMEDULLARY (IM) NAIL INTERTROCHANTRIC (Left) Up with therapy Discharge to SNF  F/u 2 weeks for wound check Plavix and ASA for DVT prevention Norco 10/325 1 poQ4-6 prn for pain Robaxin 500mg  1 po tid prn spams  Leanna Hamid D for Dr. Venita Lick [facility] [phone] 07/09/2014, 1:48 PM

## 2014-07-17 NOTE — Anesthesia Postprocedure Evaluation (Signed)
  Anesthesia Post-op Note  Patient: [name]  Procedure(s) Performed: Procedure(s): INTRAMEDULLARY (IM) NAIL INTERTROCHANTRIC (Left)  Patient Location: PACU  Anesthesia Type:General  Level of Consciousness: awake and alert   Airway and Oxygen Therapy: Patient Spontanous Breathing  Post-op Pain: mild  Post-op Assessment: Post-op Vital signs reviewed  Post-op Vital Signs: stable  Last Vitals:  Filed Vitals:   07/09/14 1334  BP: 131/61  Pulse: 80  Temp: 36.3 C  Resp: 18    Complications: No apparent anesthesia complications

## 2014-07-25 ENCOUNTER — Ambulatory Visit: Payer: Commercial Managed Care - HMO | Admitting: Cardiovascular Disease

## 2014-08-03 DEATH — deceased

## 2015-04-12 ENCOUNTER — Encounter: Payer: Self-pay | Admitting: Family

## 2015-04-18 ENCOUNTER — Encounter (HOSPITAL_COMMUNITY): Payer: Commercial Managed Care - HMO

## 2015-04-18 ENCOUNTER — Ambulatory Visit: Payer: Commercial Managed Care - HMO | Admitting: Family
